# Patient Record
Sex: Male | Born: 1935 | Race: Black or African American | Hispanic: No | Smoking: Former smoker
Health system: Southern US, Community
[De-identification: ages and names within clinical notes are randomized; demographics above are authoritative.]

## PROBLEM LIST (undated history)

## (undated) DIAGNOSIS — Z9989 Dependence on other enabling machines and devices: Secondary | ICD-10-CM

## (undated) DIAGNOSIS — T8859XA Other complications of anesthesia, initial encounter: Secondary | ICD-10-CM

## (undated) DIAGNOSIS — I350 Nonrheumatic aortic (valve) stenosis: Secondary | ICD-10-CM

## (undated) DIAGNOSIS — I639 Cerebral infarction, unspecified: Secondary | ICD-10-CM

## (undated) DIAGNOSIS — I509 Heart failure, unspecified: Secondary | ICD-10-CM

## (undated) DIAGNOSIS — J189 Pneumonia, unspecified organism: Secondary | ICD-10-CM

## (undated) DIAGNOSIS — N400 Enlarged prostate without lower urinary tract symptoms: Secondary | ICD-10-CM

## (undated) DIAGNOSIS — I2510 Atherosclerotic heart disease of native coronary artery without angina pectoris: Secondary | ICD-10-CM

## (undated) DIAGNOSIS — Z9861 Coronary angioplasty status: Secondary | ICD-10-CM

## (undated) DIAGNOSIS — D472 Monoclonal gammopathy: Principal | ICD-10-CM

## (undated) DIAGNOSIS — J61 Pneumoconiosis due to asbestos and other mineral fibers: Secondary | ICD-10-CM

## (undated) DIAGNOSIS — G4733 Obstructive sleep apnea (adult) (pediatric): Secondary | ICD-10-CM

## (undated) DIAGNOSIS — R569 Unspecified convulsions: Secondary | ICD-10-CM

## (undated) DIAGNOSIS — I10 Essential (primary) hypertension: Secondary | ICD-10-CM

## (undated) DIAGNOSIS — D469 Myelodysplastic syndrome, unspecified: Secondary | ICD-10-CM

## (undated) DIAGNOSIS — T4145XA Adverse effect of unspecified anesthetic, initial encounter: Secondary | ICD-10-CM

## (undated) DIAGNOSIS — I2119 ST elevation (STEMI) myocardial infarction involving other coronary artery of inferior wall: Secondary | ICD-10-CM

## (undated) DIAGNOSIS — C9000 Multiple myeloma not having achieved remission: Secondary | ICD-10-CM

## (undated) DIAGNOSIS — I480 Paroxysmal atrial fibrillation: Secondary | ICD-10-CM

## (undated) HISTORY — DX: Benign prostatic hyperplasia without lower urinary tract symptoms: N40.0

## (undated) HISTORY — DX: Essential (primary) hypertension: I10

## (undated) HISTORY — DX: Myelodysplastic syndrome, unspecified: D46.9

## (undated) HISTORY — DX: Nonrheumatic aortic (valve) stenosis: I35.0

## (undated) HISTORY — DX: Paroxysmal atrial fibrillation: I48.0

## (undated) HISTORY — DX: Atherosclerotic heart disease of native coronary artery without angina pectoris: I25.10

## (undated) HISTORY — DX: Morbid (severe) obesity due to excess calories: E66.01

## (undated) HISTORY — DX: ST elevation (STEMI) myocardial infarction involving other coronary artery of inferior wall: I21.19

## (undated) HISTORY — DX: Monoclonal gammopathy: D47.2

## (undated) HISTORY — DX: Multiple myeloma not having achieved remission: C90.00

## (undated) HISTORY — DX: Coronary angioplasty status: Z98.61

## (undated) HISTORY — PX: TRANSTHORACIC ECHOCARDIOGRAM: SHX275

---

## 1995-03-07 DIAGNOSIS — I2510 Atherosclerotic heart disease of native coronary artery without angina pectoris: Secondary | ICD-10-CM

## 1995-03-07 DIAGNOSIS — I2119 ST elevation (STEMI) myocardial infarction involving other coronary artery of inferior wall: Secondary | ICD-10-CM

## 1995-03-07 HISTORY — DX: Atherosclerotic heart disease of native coronary artery without angina pectoris: I25.10

## 1995-03-07 HISTORY — DX: ST elevation (STEMI) myocardial infarction involving other coronary artery of inferior wall: I21.19

## 1999-01-09 HISTORY — PX: NM MYOVIEW LTD: HXRAD82

## 2000-10-18 ENCOUNTER — Ambulatory Visit (HOSPITAL_COMMUNITY): Admission: RE | Admit: 2000-10-18 | Discharge: 2000-10-18 | Payer: Self-pay | Admitting: Gastroenterology

## 2000-10-18 ENCOUNTER — Encounter (INDEPENDENT_AMBULATORY_CARE_PROVIDER_SITE_OTHER): Payer: Self-pay | Admitting: Specialist

## 2007-02-08 HISTORY — PX: CARDIAC CATHETERIZATION: SHX172

## 2007-05-23 ENCOUNTER — Encounter: Admission: RE | Admit: 2007-05-23 | Discharge: 2007-05-23 | Payer: Self-pay | Admitting: Internal Medicine

## 2007-06-17 ENCOUNTER — Emergency Department (HOSPITAL_COMMUNITY): Admission: EM | Admit: 2007-06-17 | Discharge: 2007-06-17 | Payer: Self-pay | Admitting: Emergency Medicine

## 2007-12-13 ENCOUNTER — Encounter: Admission: RE | Admit: 2007-12-13 | Discharge: 2007-12-13 | Payer: Self-pay | Admitting: Internal Medicine

## 2007-12-16 ENCOUNTER — Encounter: Admission: RE | Admit: 2007-12-16 | Discharge: 2007-12-16 | Payer: Self-pay | Admitting: Internal Medicine

## 2007-12-20 ENCOUNTER — Ambulatory Visit: Payer: Self-pay | Admitting: Oncology

## 2007-12-27 ENCOUNTER — Encounter: Payer: Self-pay | Admitting: Oncology

## 2007-12-27 ENCOUNTER — Other Ambulatory Visit: Admission: RE | Admit: 2007-12-27 | Discharge: 2007-12-27 | Payer: Self-pay | Admitting: Oncology

## 2007-12-27 LAB — COMPREHENSIVE METABOLIC PANEL
Albumin: 4.3 g/dL (ref 3.5–5.2)
CO2: 24 mEq/L (ref 19–32)
Glucose, Bld: 114 mg/dL — ABNORMAL HIGH (ref 70–99)
Sodium: 139 mEq/L (ref 135–145)
Total Bilirubin: 0.4 mg/dL (ref 0.3–1.2)
Total Protein: 7.7 g/dL (ref 6.0–8.3)

## 2007-12-27 LAB — CBC WITH DIFFERENTIAL/PLATELET
EOS%: 0.4 % (ref 0.0–7.0)
HCT: 37.6 % — ABNORMAL LOW (ref 38.7–49.9)
HGB: 12.9 g/dL — ABNORMAL LOW (ref 13.0–17.1)
LYMPH%: 52.5 % — ABNORMAL HIGH (ref 14.0–48.0)
MCH: 28.2 pg (ref 28.0–33.4)
MCV: 82.1 fL (ref 81.6–98.0)
MONO#: 0.5 10*3/uL (ref 0.1–0.9)
MONO%: 27.5 % — ABNORMAL HIGH (ref 0.0–13.0)
NEUT%: 18.2 % — ABNORMAL LOW (ref 40.0–75.0)
Platelets: 149 10*3/uL (ref 145–400)
WBC: 1.9 10*3/uL — ABNORMAL LOW (ref 4.0–10.0)
lymph#: 1 10*3/uL (ref 0.9–3.3)

## 2007-12-27 LAB — LACTATE DEHYDROGENASE: LDH: 155 U/L (ref 94–250)

## 2008-01-16 ENCOUNTER — Ambulatory Visit: Payer: Self-pay | Admitting: Oncology

## 2008-01-16 LAB — CHCC SMEAR

## 2008-01-16 LAB — CBC WITH DIFFERENTIAL/PLATELET
BASO%: 1.4 % (ref 0.0–2.0)
Basophils Absolute: 0 10*3/uL (ref 0.0–0.1)
HGB: 12.8 g/dL — ABNORMAL LOW (ref 13.0–17.1)
LYMPH%: 43.7 % (ref 14.0–48.0)
MCV: 81.9 fL (ref 81.6–98.0)
NEUT#: 0.5 10*3/uL — ABNORMAL LOW (ref 1.5–6.5)
Platelets: 191 10*3/uL (ref 145–400)

## 2008-03-20 ENCOUNTER — Ambulatory Visit: Payer: Self-pay | Admitting: Oncology

## 2008-03-24 LAB — CBC WITH DIFFERENTIAL/PLATELET
BASO%: 0.4 % (ref 0.0–2.0)
EOS%: 0.2 % (ref 0.0–7.0)
HCT: 37.5 % — ABNORMAL LOW (ref 38.7–49.9)
HGB: 12.6 g/dL — ABNORMAL LOW (ref 13.0–17.1)
LYMPH%: 52 % — ABNORMAL HIGH (ref 14.0–48.0)
MCHC: 33.5 g/dL (ref 32.0–35.9)
MONO#: 0.6 10*3/uL (ref 0.1–0.9)
MONO%: 26.7 % — ABNORMAL HIGH (ref 0.0–13.0)
Platelets: 145 10*3/uL (ref 145–400)
RDW: 13.9 % (ref 11.2–14.6)
WBC: 2.3 10*3/uL — ABNORMAL LOW (ref 4.0–10.0)
lymph#: 1.2 10*3/uL (ref 0.9–3.3)

## 2008-03-24 LAB — TECHNOLOGIST REVIEW

## 2008-03-25 LAB — ANA: Anti Nuclear Antibody(ANA): POSITIVE — AB

## 2008-03-25 LAB — ANTI-NUCLEAR AB-TITER (ANA TITER): ANA Titer 1: NEGATIVE

## 2008-03-31 ENCOUNTER — Ambulatory Visit (HOSPITAL_COMMUNITY): Admission: RE | Admit: 2008-03-31 | Discharge: 2008-03-31 | Payer: Self-pay | Admitting: Oncology

## 2008-03-31 ENCOUNTER — Ambulatory Visit: Payer: Self-pay | Admitting: Oncology

## 2008-03-31 ENCOUNTER — Encounter: Payer: Self-pay | Admitting: Oncology

## 2008-04-30 LAB — CBC WITH DIFFERENTIAL/PLATELET
BASO%: 0.3 % (ref 0.0–2.0)
EOS%: 0.2 % (ref 0.0–7.0)
HGB: 12.3 g/dL — ABNORMAL LOW (ref 13.0–17.1)
LYMPH%: 21.5 % (ref 14.0–49.0)
MCV: 85 fL (ref 79.3–98.0)
NEUT%: 63 % (ref 39.0–75.0)
RBC: 4.47 10*6/uL (ref 4.20–5.82)
nRBC: 0 % (ref 0–0)

## 2008-05-12 ENCOUNTER — Ambulatory Visit: Payer: Self-pay | Admitting: Oncology

## 2008-05-14 LAB — COMPREHENSIVE METABOLIC PANEL
ALT: 16 U/L (ref 0–53)
CO2: 22 mEq/L (ref 19–32)
Chloride: 104 mEq/L (ref 96–112)
Creatinine, Ser: 1.16 mg/dL (ref 0.40–1.50)
Glucose, Bld: 104 mg/dL — ABNORMAL HIGH (ref 70–99)
Sodium: 142 mEq/L (ref 135–145)
Total Bilirubin: 0.3 mg/dL (ref 0.3–1.2)
Total Protein: 7.3 g/dL (ref 6.0–8.3)

## 2008-05-14 LAB — CBC WITH DIFFERENTIAL/PLATELET
BASO%: 0 % (ref 0.0–2.0)
EOS%: 1 % (ref 0.0–7.0)
Eosinophils Absolute: 0 10*3/uL (ref 0.0–0.5)
LYMPH%: 36.1 % (ref 14.0–49.0)
MCH: 27.5 pg (ref 27.2–33.4)
MCV: 85.1 fL (ref 79.3–98.0)
NEUT%: 30.6 % — ABNORMAL LOW (ref 39.0–75.0)
RBC: 3.89 10*6/uL — ABNORMAL LOW (ref 4.20–5.82)
RDW: 14.4 % (ref 11.0–14.6)
WBC: 3.1 10*3/uL — ABNORMAL LOW (ref 4.0–10.3)

## 2008-07-10 ENCOUNTER — Ambulatory Visit: Payer: Self-pay | Admitting: Oncology

## 2010-06-20 LAB — CBC
MCHC: 33.6 g/dL (ref 30.0–36.0)
MCV: 84.1 fL (ref 78.0–100.0)
Platelets: 147 10*3/uL — ABNORMAL LOW (ref 150–400)
WBC: 2.3 10*3/uL — ABNORMAL LOW (ref 4.0–10.5)

## 2010-06-20 LAB — DIFFERENTIAL
Eosinophils Relative: 0 % (ref 0–5)
Lymphocytes Relative: 37 % (ref 12–46)
Neutrophils Relative %: 28 % — ABNORMAL LOW (ref 43–77)

## 2010-06-20 LAB — CHROMOSOME ANALYSIS, BONE MARROW

## 2010-07-19 NOTE — Op Note (Signed)
NAME:  BRADEN, CIMO NO.:  [id number]   MEDICAL RECORD NO.:  [id number]          PATIENT TYPE:  OUT   LOCATION:  [facility]                         FACILITY:  [facility]   PHYSICIAN:  Firas N. Shadad        DATE OF BIRTH:  [birth year]   DATE OF PROCEDURE:  03/31/2008  DATE OF DISCHARGE:                               OPERATIVE REPORT   PROCEDURE:  Bone marrow aspirate and biopsy.   Mr. Sylla is a 77 year old gentleman whom I am evaluating for  neutropenia and mild anemia without any clear-cut etiology despite his  workup.  To complete the workup, he is scheduled today for bone marrow  aspirate and biopsy.  The risks and benefits of the procedure were  explained to Mr. Blow in detail in clinic, as well as briefly today and  informed consent was obtained.  The patient was brought into short stay  at [facility].  A timeout was taken out to identify Mr. Malia  and the procedure.  American Society of anesthesiology scale was  determined to be class II, and his Mallampati class was class III.  The  patient subsequently was placed in the decubitus position exposing his  left iliac crest.  The skin was prepped and draped in a sterile fashion  using Betadine.  Conscious sedation was obtained using 5 mg of Versed 50  mg of Demerol.  Subsequently, the skin was anesthetized using 2%  lidocaine, as well as the subcutaneous tissue and the periosteum.  Using  the Jamshidi needle an aspirate was obtained without difficulty and  using the same Jamshidi needle in a different location, a biopsy was  obtained without any difficulty.  The patient tolerated the procedure  well.  There were no complications.  No bleeding problems.  The patient  tolerated the procedure well.  He was instructed to lay flat for the  next 45 minutes, and he will follow-up with me on April 02, 2008, to  discuss the results.           ______________________________  Blenda Nicely [facility]  Electronically  Signed     FNS/MEDQ  D:  03/31/2008  T:  03/31/2008  Job:  [id number]

## 2010-07-22 NOTE — Procedures (Signed)
[facility]  Patient:    Terry Robertson, Terry Robertson                          MRN: [id number] Proc. Date: 10/18/00 Adm. Date:  [id number] Disc. Date: [id number] Attending:  Charna Elizabeth CC:         Merlene Laughter. Renae Gloss, M.D.   Procedure Report  DATE OF BIRTH:  1928/06/09.  PROCEDURE:  Colonoscopy with hot biopsy.  ENDOSCOPIST:  Anselmo Rod, M.D.  INSTRUMENT USED:  Olympus video colonoscope.  INDICATION FOR PROCEDURE:  A 77 year old African-American male with a history of rectal bleeding.  Rule out colonic polyps, hemorrhoids, etc.  PREPROCEDURE PREPARATION:  Informed consent was procured from the patient. The patient was fasted for eight hours prior to the procedure and prepped with a bottle of magnesium citrate and a gallon of NuLytely the night prior to the procedure.  PREPROCEDURE PHYSICAL:  VITAL SIGNS:  The patient had stable vital signs.  NECK:  Supple.  CHEST:  Clear to auscultation.  S1, S2 regular.  ABDOMEN:  Soft with normal bowel sounds.  DESCRIPTION OF PROCEDURE:  The patient was placed in the left lateral decubitus position and sedated with 50 mg of Demerol and 5 mg of Versed intravenously.  Once the patient was adequately sedate and maintained on low-flow oxygen and continuous cardiac monitoring, the Olympus video colonoscope was advanced from the rectum to the cecum with slight difficulty secondary to some residual stool in the colon.  There were two small polyps biopsied from the right colon just distal to the cecum.  Small internal hemorrhoid appreciated on retroflexion.  The terminal ileum appeared healthy and without lesions.  IMPRESSION: 1. Healthy-appearing terminal ileum. 2. Two small polyps hot biopsied from right colon just distal to the cecum. 3. Small, nonbleeding internal hemorrhoid.  RECOMMENDATIONS: 1. A high-fiber diet has been recommended for the patient. 2. Await pathology results. 3. Outpatient  follow-up in the next four weeks. DD:  10/21/00 TD:  10/22/00 Job: [id number]

## 2010-12-05 HISTORY — PX: TRANSURETHRAL RESECTION OF PROSTATE: SHX73

## 2011-06-12 ENCOUNTER — Telehealth: Payer: Self-pay | Admitting: Oncology

## 2011-06-12 NOTE — Telephone Encounter (Signed)
lmonvm adviisng the pt of his f/u appt with dr Clelia Croft to get restablished. lmonvm for the pt to call me back to confirm the appt

## 2011-06-13 ENCOUNTER — Telehealth: Payer: Self-pay | Admitting: Oncology

## 2011-06-13 NOTE — Telephone Encounter (Signed)
S/w the pt and he is aware of the new pt appt with dr Clelia Croft on 06/14/2011@10 :00am

## 2011-06-13 NOTE — Telephone Encounter (Signed)
Referred by Dr. Allyne Gee Dx- Leukocytosis

## 2011-06-14 ENCOUNTER — Ambulatory Visit (HOSPITAL_BASED_OUTPATIENT_CLINIC_OR_DEPARTMENT_OTHER): Payer: Self-pay | Admitting: Oncology

## 2011-06-14 ENCOUNTER — Encounter: Payer: Self-pay | Admitting: Oncology

## 2011-06-14 ENCOUNTER — Other Ambulatory Visit: Payer: Self-pay | Admitting: Oncology

## 2011-06-14 ENCOUNTER — Telehealth: Payer: Self-pay | Admitting: Oncology

## 2011-06-14 ENCOUNTER — Ambulatory Visit: Payer: Self-pay

## 2011-06-14 ENCOUNTER — Other Ambulatory Visit (HOSPITAL_BASED_OUTPATIENT_CLINIC_OR_DEPARTMENT_OTHER): Payer: Self-pay | Admitting: Lab

## 2011-06-14 VITALS — BP 135/65 | HR 78 | Temp 96.7°F | Ht 65.0 in | Wt 259.9 lb

## 2011-06-14 DIAGNOSIS — D649 Anemia, unspecified: Secondary | ICD-10-CM

## 2011-06-14 DIAGNOSIS — D709 Neutropenia, unspecified: Secondary | ICD-10-CM

## 2011-06-14 DIAGNOSIS — D759 Disease of blood and blood-forming organs, unspecified: Secondary | ICD-10-CM

## 2011-06-14 DIAGNOSIS — D469 Myelodysplastic syndrome, unspecified: Secondary | ICD-10-CM

## 2011-06-14 LAB — CBC WITH DIFFERENTIAL/PLATELET
Basophils Absolute: 0 10*3/uL (ref 0.0–0.1)
Eosinophils Absolute: 0 10*3/uL (ref 0.0–0.5)
HGB: 11.7 g/dL — ABNORMAL LOW (ref 13.0–17.1)
NEUT#: 0.3 10*3/uL — CL (ref 1.5–6.5)
RDW: 14.4 % (ref 11.0–14.6)
WBC: 2.4 10*3/uL — ABNORMAL LOW (ref 4.0–10.3)
lymph#: 1.2 10*3/uL (ref 0.9–3.3)
nRBC: 0 % (ref 0–0)

## 2011-06-14 LAB — CHCC SMEAR

## 2011-06-14 NOTE — Progress Notes (Signed)
Patient came in today as a new patient,he said he has not got his new insurance card in the mail yet,but it is on the way.

## 2011-06-14 NOTE — Progress Notes (Signed)
Mailed updated medication list to patient's home 

## 2011-06-14 NOTE — Telephone Encounter (Signed)
gve the pt his oct 2013 appt calendar °

## 2011-06-14 NOTE — Progress Notes (Signed)
Hematology and Oncology Follow Up Visit  Terry Robertson [id number] 25-Jun-1928 78 y.o. 06/14/2011 11:05 AM    Principle Diagnosis: This is a 78 year old gentleman with myelodysplastic syndrome manifesting predominantly with mild anemia and neutropenia.  Prior Therapy: S/P bone marrow biopsy in 03/2008 to confirm the above diagnosis.    Interim History: Terry Robertson presents today for a follow-up visit.  He is a gentleman who has been following since October 2009, with an isolated neutropenia.  However, in most recent time developed worsening anemia as well.  His bone marrow biopsy showed an element of both myeloproliferative as well as myelodysplastic syndrome, but overall I felt that the features are consistent with myelodysplastic syndrome.  I have elected to treatment him with supportive care only for the time being.  He is completely asymptomatic.  He has not reported any recurrent sign of pulmonary infection.  He is not having increased blast.  His cytogenetics are normat.  Since the last time I saw him he has not reported any problems.  He has received one Neulasta injection in the past and has helped his blood count dramatically and it is good to know that he has a good response to it down the line.  Otherwise, Terry Robertson does not report any recurrent sign of pulmonary injection.  He has not reported any peripheral neuropathy.  He has not reported any other complaints. He failed to follow up with me for the last three years, but he back again to reestablish care.   Medications: I have reviewed the patient's current medications. No current outpatient prescriptions on file.  Allergies: Allergies not on file  Past Medical History, Surgical history, Social history, and Family History were reviewed and updated.  Review of Systems: Constitutional:  Negative for fever, chills, night sweats, anorexia, weight loss, pain. Cardiovascular: no chest pain or dyspnea on exertion Respiratory: no cough, shortness  of breath, or wheezing Neurological: no TIA or stroke symptoms Dermatological: negative ENT: negative Skin: Negative. Gastrointestinal: no abdominal pain, change in bowel habits, or black or bloody stools Genito-Urinary: negative Hematological and Lymphatic: negative Breast: negative Musculoskeletal: negative Remaining ROS negative. Physical Exam: Blood pressure 135/65, pulse 78, temperature 96.7 F (35.9 C), temperature source Oral, height 5\' 5"  (1.651 m), weight 259 lb 14.4 oz (117.89 kg). ECOG: 1 General appearance: alert Head: Normocephalic, without obvious abnormality, atraumatic Neck: no adenopathy, no carotid bruit, no JVD, supple, symmetrical, trachea midline and thyroid not enlarged, symmetric, no tenderness/mass/nodules Lymph nodes: Cervical, supraclavicular, and axillary nodes normal. Heart:regular rate and rhythm, S1, S2 normal, no murmur, click, rub or gallop Lung:chest clear, no wheezing, rales, normal symmetric air entry Abdomin: soft, non-tender, without masses or organomegaly EXT:no erythema, induration, or nodules   Lab Results: Lab Results  Component Value Date   WBC 2.4* 06/14/2011   HGB 11.7* 06/14/2011   HCT 35.0* 06/14/2011   MCV 83.6 06/14/2011   PLT 93* 06/14/2011     Chemistry      Component Value Date/Time   NA 142 05/14/2008 1446   K 4.0 05/14/2008 1446   CL 104 05/14/2008 1446   CO2 22 05/14/2008 1446   BUN 18 05/14/2008 1446   CREATININE 1.16 05/14/2008 1446      Component Value Date/Time   CALCIUM 8.1* 05/14/2008 1446   ALKPHOS 48 05/14/2008 1446   AST 21 05/14/2008 1446   ALT 16 05/14/2008 1446   BILITOT 0.3 05/14/2008 1446        Impression and Plan:  This is a  78 year old gentleman with following the issues: 1. Myelodysplastic/myeloproliferative disorder, JAK2 mutation is negative.  His cytogenetics normal, but for the time being it is more likely myelodysplastic syndrome more than anything else.  We will continue supportive measures at this  time.I will not treat him with any growth factor support unless he has a symptomatology such as recurrent infection etc.  We will continue to follow him every few months. 2.         Cytopenias: No need for any transfusions or growth factor support at this time.    [facility], MD 4/10/201311:05 AM

## 2011-06-15 LAB — COMPREHENSIVE METABOLIC PANEL
ALT: 10 U/L (ref 0–53)
Albumin: 4.1 g/dL (ref 3.5–5.2)
Alkaline Phosphatase: 41 U/L (ref 39–117)
CO2: 23 mEq/L (ref 19–32)
Glucose, Bld: 139 mg/dL — ABNORMAL HIGH (ref 70–99)
Potassium: 3.7 mEq/L (ref 3.5–5.3)
Sodium: 136 mEq/L (ref 135–145)
Total Protein: 8.4 g/dL — ABNORMAL HIGH (ref 6.0–8.3)

## 2011-07-12 ENCOUNTER — Encounter: Payer: Self-pay | Admitting: Oncology

## 2011-07-12 NOTE — Progress Notes (Signed)
Patient came in today with a letter stating that the time of his visit medicare should have been filed. Patient also gave new insurance card. I have added information in the system.

## 2011-08-05 ENCOUNTER — Encounter (HOSPITAL_COMMUNITY): Payer: Self-pay

## 2011-08-05 ENCOUNTER — Inpatient Hospital Stay (HOSPITAL_COMMUNITY)
Admission: EM | Admit: 2011-08-05 | Discharge: 2011-08-14 | DRG: 314 | Disposition: A | Discharge status: Home / Self Care | Payer: Medicare Other | Attending: Cardiology | Admitting: Cardiology

## 2011-08-05 ENCOUNTER — Emergency Department (HOSPITAL_COMMUNITY): Payer: Medicare Other

## 2011-08-05 DIAGNOSIS — Z7982 Long term (current) use of aspirin: Secondary | ICD-10-CM

## 2011-08-05 DIAGNOSIS — E860 Dehydration: Secondary | ICD-10-CM | POA: Diagnosis present

## 2011-08-05 DIAGNOSIS — D696 Thrombocytopenia, unspecified: Secondary | ICD-10-CM | POA: Diagnosis present

## 2011-08-05 DIAGNOSIS — E8779 Other fluid overload: Secondary | ICD-10-CM | POA: Diagnosis present

## 2011-08-05 DIAGNOSIS — I6340 Cerebral infarction due to embolism of unspecified cerebral artery: Secondary | ICD-10-CM | POA: Diagnosis not present

## 2011-08-05 DIAGNOSIS — R471 Dysarthria and anarthria: Secondary | ICD-10-CM | POA: Diagnosis not present

## 2011-08-05 DIAGNOSIS — Z9861 Coronary angioplasty status: Secondary | ICD-10-CM

## 2011-08-05 DIAGNOSIS — R4701 Aphasia: Secondary | ICD-10-CM | POA: Diagnosis not present

## 2011-08-05 DIAGNOSIS — Z87891 Personal history of nicotine dependence: Secondary | ICD-10-CM

## 2011-08-05 DIAGNOSIS — D649 Anemia, unspecified: Secondary | ICD-10-CM | POA: Diagnosis present

## 2011-08-05 DIAGNOSIS — E119 Type 2 diabetes mellitus without complications: Secondary | ICD-10-CM | POA: Diagnosis present

## 2011-08-05 DIAGNOSIS — I959 Hypotension, unspecified: Principal | ICD-10-CM | POA: Diagnosis present

## 2011-08-05 DIAGNOSIS — I509 Heart failure, unspecified: Secondary | ICD-10-CM

## 2011-08-05 DIAGNOSIS — I10 Essential (primary) hypertension: Secondary | ICD-10-CM | POA: Diagnosis present

## 2011-08-05 DIAGNOSIS — D469 Myelodysplastic syndrome, unspecified: Secondary | ICD-10-CM | POA: Diagnosis present

## 2011-08-05 DIAGNOSIS — I4891 Unspecified atrial fibrillation: Secondary | ICD-10-CM | POA: Diagnosis present

## 2011-08-05 DIAGNOSIS — I5189 Other ill-defined heart diseases: Secondary | ICD-10-CM | POA: Diagnosis present

## 2011-08-05 DIAGNOSIS — N179 Acute kidney failure, unspecified: Secondary | ICD-10-CM | POA: Diagnosis present

## 2011-08-05 DIAGNOSIS — E8770 Fluid overload, unspecified: Secondary | ICD-10-CM | POA: Diagnosis present

## 2011-08-05 DIAGNOSIS — Z79899 Other long term (current) drug therapy: Secondary | ICD-10-CM

## 2011-08-05 DIAGNOSIS — Z9079 Acquired absence of other genital organ(s): Secondary | ICD-10-CM

## 2011-08-05 DIAGNOSIS — D61818 Other pancytopenia: Secondary | ICD-10-CM | POA: Diagnosis present

## 2011-08-05 DIAGNOSIS — I2510 Atherosclerotic heart disease of native coronary artery without angina pectoris: Secondary | ICD-10-CM | POA: Diagnosis present

## 2011-08-05 DIAGNOSIS — I359 Nonrheumatic aortic valve disorder, unspecified: Secondary | ICD-10-CM | POA: Diagnosis present

## 2011-08-05 DIAGNOSIS — E785 Hyperlipidemia, unspecified: Secondary | ICD-10-CM | POA: Diagnosis present

## 2011-08-05 DIAGNOSIS — I639 Cerebral infarction, unspecified: Secondary | ICD-10-CM

## 2011-08-05 LAB — CARDIAC PANEL(CRET KIN+CKTOT+MB+TROPI): Troponin I: <0.3 ng/mL (ref <0.30)

## 2011-08-05 LAB — CBC
Hemoglobin: 11.5 g/dL — ABNORMAL LOW (ref 13.0–17.0)
RBC: 4.24 MIL/uL (ref 4.22–5.81)

## 2011-08-05 LAB — COMPREHENSIVE METABOLIC PANEL
CO2: 25 mEq/L (ref 19–32)
Calcium: 8.8 mg/dL (ref 8.4–10.5)
Creatinine, Ser: 1.74 mg/dL — ABNORMAL HIGH (ref 0.50–1.35)
GFR calc Af Amer: 41 mL/min — ABNORMAL LOW (ref >90)
GFR calc non Af Amer: 35 mL/min — ABNORMAL LOW (ref >90)
Glucose, Bld: 168 mg/dL — ABNORMAL HIGH (ref 70–99)

## 2011-08-05 LAB — DIFFERENTIAL
Basophils Relative: 0 % (ref 0–1)
Lymphocytes Relative: 12 % (ref 12–46)
Lymphs Abs: 1.1 10*3/uL (ref 0.7–4.0)
Monocytes Relative: 26 % — ABNORMAL HIGH (ref 3–12)
Neutro Abs: 5.7 10*3/uL (ref 1.7–7.7)
Neutrophils Relative %: 62 % (ref 43–77)

## 2011-08-05 LAB — MRSA PCR SCREENING: MRSA by PCR: NEGATIVE

## 2011-08-05 LAB — PROTIME-INR: Prothrombin Time: 15.4 seconds — ABNORMAL HIGH (ref 11.6–15.2)

## 2011-08-05 LAB — LIPASE, BLOOD: Lipase: 33 U/L (ref 11–59)

## 2011-08-05 MED ORDER — ONDANSETRON HCL 4 MG/2ML IJ SOLN: 4.0000 mg | Freq: Four times a day (QID) | INTRAMUSCULAR | Status: DC | PRN

## 2011-08-05 MED ORDER — ASPIRIN EC 81 MG PO TBEC
81.0000 mg | DELAYED_RELEASE_TABLET | Freq: Every day | ORAL | Status: DC
  Administered 2011-08-05 – 2011-08-14 (×10): 81 mg via ORAL
  Filled 2011-08-05 (×10): qty 1

## 2011-08-05 MED ORDER — SIMVASTATIN 20 MG PO TABS
20.0000 mg | ORAL_TABLET | Freq: Every day | ORAL | Status: DC
  Administered 2011-08-05 – 2011-08-08 (×4): 20 mg via ORAL
  Filled 2011-08-05 (×4): qty 1

## 2011-08-05 MED ORDER — ACETAMINOPHEN 325 MG PO TABS: 650.0000 mg | ORAL_TABLET | ORAL | Status: DC | PRN

## 2011-08-05 MED ORDER — ASPIRIN EC 81 MG PO TBEC: 81.0000 mg | DELAYED_RELEASE_TABLET | Freq: Every day | ORAL | Status: DC

## 2011-08-05 MED ORDER — SODIUM CHLORIDE 0.9 % IV BOLUS (SEPSIS)
1000.0000 mL | Freq: Once | INTRAVENOUS | Status: AC
  Administered 2011-08-05: 1000 mL via INTRAVENOUS

## 2011-08-05 MED ORDER — ASPIRIN 81 MG PO TABS: 81.0000 mg | ORAL_TABLET | Freq: Every day | ORAL | Status: DC

## 2011-08-05 MED ORDER — NITROGLYCERIN 0.4 MG SL SUBL
0.4000 mg | SUBLINGUAL_TABLET | SUBLINGUAL | Status: DC | PRN
  Administered 2011-08-06: 0.4 mg via SUBLINGUAL
  Filled 2011-08-05: qty 25

## 2011-08-05 MED ORDER — DOPAMINE-DEXTROSE 3.2-5 MG/ML-% IV SOLN
5.0000 ug/kg/min | INTRAVENOUS | Status: DC
  Administered 2011-08-05 – 2011-08-06 (×2): 5 ug/kg/min via INTRAVENOUS
  Administered 2011-08-06: 12 ug/kg/min via INTRAVENOUS
  Filled 2011-08-05 (×3): qty 250

## 2011-08-05 MED ORDER — OMEGA-3-ACID ETHYL ESTERS 1 G PO CAPS
1.0000 g | ORAL_CAPSULE | Freq: Two times a day (BID) | ORAL | Status: DC
  Administered 2011-08-05 – 2011-08-14 (×18): 1 g via ORAL
  Filled 2011-08-05 (×20): qty 1

## 2011-08-05 NOTE — ED Notes (Signed)
Critical Lab Tropn 0.32 called to Dahlia Byes RN, MD aware

## 2011-08-05 NOTE — ED Notes (Signed)
Pt complains of weakness, and sweating feeling for 2 days

## 2011-08-05 NOTE — ED Notes (Signed)
Fluids NS changed to 20ml

## 2011-08-05 NOTE — ED Notes (Signed)
Patient is unable to void at present. 

## 2011-08-05 NOTE — H&P (Signed)
Terry Robertson is an 78 y.o. male.   Chief Complaint:  HPI:   Patient is a-year-old male with history of coronary artery disease status post PCI to the left circumflex in 1997 and his most recent heart catheterization was December of 08. This revealed a widely patent circumflex stent in the OM branch, normal LAD and 100% occluded RCA in the distal portion near small PDA.  Ejection fraction was 55%. Patient has not had a stress test since the last cath. Echocardiogram on 12/29/2010 showed mild concentric LVH, EF greater than 55% with impaired relaxation. Mild sclerotic aortic valve with mild stenosis. His history also includes morbid obesity, dyslipidemia, hypertension, myelodysplastic syndrome with mild anemia and neutropenia, status post TURP in October 2012, diabetes mellitus.  Patient presents with 2 days of weakness and night sweats. Patient states he is felt to "drowsy" starting 2 days ago. He states he been short of breath, diaphoretic, he's had a trace of lower extremity edema and increasing urinary urgency. He also states he had melena. He denies orthopnea, chest pain, nausea, vomiting, fever, dysuria, hematuria, palpitations, dizziness.  Patient was just seen by Dr. Herbie Baltimore on May 14 and then doing quite well.  Past Medical History  Diagnosis Date  . Coronary artery disease   . Diabetes mellitus   . Hypertension     Past Surgical History  Procedure Date  . Cardiac surgery     History reviewed. No pertinent family history. Social History:  does not have a smoking history on file. He does not have any smokeless tobacco history on file. His alcohol and drug histories not on file.  Allergies: No Known Allergies  Medications: Aspirin 81 mg daily, omeprazole 40 mg daily, HCTZ 20 mg daily, amlodipine 10 mg daily, Hytrin 2 mg daily, simvastatin 20 mg daily, atenolol 50 mg daily, lavages 1000 mg 2 tablets twice daily, Imdur 30 mg daily, Neurontin twice daily, Flexeril twice daily  (Not in a  hospital admission)  Results for orders placed during the hospital encounter of 08/05/11 (from the past 48 hour(s))  COMPREHENSIVE METABOLIC PANEL     Status: Abnormal   Collection Time   08/05/11  1:27 PM      Component Value Range Comment   Sodium 134 (*) 135 - 145 (mEq/L)    Potassium 4.1  3.5 - 5.1 (mEq/L)    Chloride 98  96 - 112 (mEq/L)    CO2 25  19 - 32 (mEq/L)    Glucose, Bld 168 (*) 70 - 99 (mg/dL)    BUN 29 (*) 6 - 23 (mg/dL)    Creatinine, Ser 4.09 (*) 0.50 - 1.35 (mg/dL)    Calcium 8.8  8.4 - 10.5 (mg/dL)    Total Protein 8.6 (*) 6.0 - 8.3 (g/dL)    Albumin 2.9 (*) 3.5 - 5.2 (g/dL)    AST 29  0 - 37 (U/L)    ALT 13  0 - 53 (U/L)    Alkaline Phosphatase 42  39 - 117 (U/L)    Total Bilirubin 0.4  0.3 - 1.2 (mg/dL)    GFR calc non Af Amer 35 (*) >90 (mL/min)    GFR calc Af Amer 41 (*) >90 (mL/min)   CBC     Status: Abnormal   Collection Time   08/05/11  1:27 PM      Component Value Range Comment   WBC 9.2  4.0 - 10.5 (K/uL)    RBC 4.24  4.22 - 5.81 (MIL/uL)    Hemoglobin  11.5 (*) 13.0 - 17.0 (g/dL)    HCT 16.1 (*) 09.6 - 52.0 (%)    MCV 84.7  78.0 - 100.0 (fL)    MCH 27.1  26.0 - 34.0 (pg)    MCHC 32.0  30.0 - 36.0 (g/dL)    RDW 04.5  40.9 - 81.1 (%)    Platelets 84 (*) 150 - 400 (K/uL) PLATELET COUNT CONFIRMED BY SMEAR  DIFFERENTIAL     Status: Abnormal   Collection Time   08/05/11  1:27 PM      Component Value Range Comment   Neutrophils Relative 62  43 - 77 (%)    Neutro Abs 5.7  1.7 - 7.7 (K/uL)    Lymphocytes Relative 12  12 - 46 (%)    Lymphs Abs 1.1  0.7 - 4.0 (K/uL)    Monocytes Relative 26 (*) 3 - 12 (%)    Monocytes Absolute 2.4 (*) 0.1 - 1.0 (K/uL)    Eosinophils Relative 0  0 - 5 (%)    Eosinophils Absolute 0.0  0.0 - 0.7 (K/uL)    Basophils Relative 0  0 - 1 (%)    Basophils Absolute 0.0  0.0 - 0.1 (K/uL)   TROPONIN I     Status: Abnormal   Collection Time   08/05/11  1:27 PM      Component Value Range Comment   Troponin I 0.32 (*) <0.30 (ng/mL)     LIPASE, BLOOD     Status: Normal   Collection Time   08/05/11  1:27 PM      Component Value Range Comment   Lipase 33  11 - 59 (U/L)   LACTIC ACID, PLASMA     Status: Normal   Collection Time   08/05/11  1:27 PM      Component Value Range Comment   Lactic Acid, Venous 1.6  0.5 - 2.2 (mmol/L)   PRO B NATRIURETIC PEPTIDE     Status: Abnormal   Collection Time   08/05/11  1:27 PM      Component Value Range Comment   Pro B Natriuretic peptide (BNP) 11328.0 (*) 0 - 450 (pg/mL)   PROTIME-INR     Status: Abnormal   Collection Time   08/05/11  1:27 PM      Component Value Range Comment   Prothrombin Time 15.4 (*) 11.6 - 15.2 (seconds)    INR 1.19  0.00 - 1.49     Dg Chest Portable 1 View  08/05/2011  *RADIOLOGY REPORT*  Clinical Data: Weakness and night sweats  PORTABLE CHEST - 1 VIEW  Comparison: 12/16/2007  Findings: Heart size is mildly enlarged.  No pleural effusion or pulmonary edema.  There is no airspace consolidation identified.  IMPRESSION:  1.  Cardiac enlargement. 2.  No heart failure.  Original Report Authenticated By: Rosealee Albee, M.D.    Review of Systems  Constitutional: Positive for malaise/fatigue and diaphoresis. Negative for fever.  HENT: Negative for congestion and sore throat.   Eyes: Negative for blurred vision.  Respiratory: Positive for shortness of breath. Negative for cough.   Cardiovascular: Positive for leg swelling. Negative for chest pain, palpitations, orthopnea and PND.  Gastrointestinal: Positive for melena. Negative for nausea, vomiting, abdominal pain, diarrhea, constipation and blood in stool.  Genitourinary: Positive for urgency. Negative for dysuria and hematuria.  Neurological: Positive for weakness. Negative for dizziness and headaches.    Blood pressure 82/46, pulse 67, temperature 98.5 F (36.9 C), temperature source Oral, resp. rate 27,  SpO2 92.00%. Physical Exam  Constitutional: He is oriented to person, place, and time. He appears  well-developed. He appears distressed.       Tachypneic, morbidly obese  HENT:  Head: Normocephalic and atraumatic.  Mouth/Throat: Oropharynx is clear and moist. No oropharyngeal exudate.  Eyes: EOM are normal. Pupils are equal, round, and reactive to light. No scleral icterus.  Neck: Normal range of motion. Neck supple. JVD present.       He has a right edematous parotid gland which is tender to palpation  Cardiovascular: Normal rate and regular rhythm.   No murmur heard. Pulses:      Radial pulses are 2+ on the right side, and 2+ on the left side.       Dorsalis pedis pulses are 2+ on the right side, and 2+ on the left side.       No carotid or femoral bruits  Respiratory: He has no wheezes. He has no rales.       Decreased breath sounds bilaterally otherwise clear  GI: Soft. Bowel sounds are normal. He exhibits distension. There is no tenderness.  Musculoskeletal: He exhibits edema.       Trace of lower extremity edema  Neurological: He is alert and oriented to person, place, and time. He exhibits normal muscle tone.  Skin: Skin is warm and dry.  Psychiatric: He has a normal mood and affect.     Assessment/Plan Patient Active Hospital Problem List:  Hypotension (08/05/2011)  CAD (coronary artery disease) PCI to circumflex in 1997.  100% occluded RCA (08/05/2011) Morbid obesity (08/05/2011) Myelodysplastic syndrome (08/05/2011) Dyslipidemia (08/05/2011) HTN (hypertension) (08/05/2011) Volume overload (08/05/2011) S/P TURP Oct 2012 (08/05/2011) DM type 2 (diabetes mellitus, type 2) (08/05/2011) Acute renal insufficiency  Plan:  Patient will be admitted to step down.  He was started on dopamine. Check urinalysis and blood cultures. He is likely having a elevated troponin due to demand ischemia. His creatinine is likely elevated due to underperfusion of the kidneys secondary to hypotension. We'll start to decrease home blood pressure medications.  We'll recheck 2-D echocardiogram.  HAGER,  BRYAN 08/05/2011, 4:02 PM  ATTENDING ATTESTATION:  I have seen and examined the patient along with Wilburt Finlay, PA.  I have reviewed the chart, notes and new data.  I agree with Bryan's note.  Brief Description: Patient well known to me (my clinic patient) with h/o CAD (PCI to Cx & 100% CTO of RCA), hypertension, DM-2, and morbid obesity (etc.) who was doing well when seen recently in clinic.  Over the past "few days" he has become more nauseated & weak, with more difficulty breathing & orthopnea.  He denies any anginal pain at rest or with exertion.  He has not actually vomitted, but has been nauseated & not eating well -- but continued to take his BP medications. He presented to the ER this AM with SBPs in the 60s with weakness, nausea & fatigue.  My review of his CXR demonstrates diffuse airspace disease - suggesting pulmonary edema  Key examination changes: mild JVD, Aortic Sclerosis murmur, mild rales with decreased bibasilar breath sounds but only mild (baseline) edema.R cervical / parotid region tenderness.  Key new findings / data: Cr 1.74, mild troponin elevation (0.32 -no ECG changes & no angina), mildly elevated monocyte level but no bands or increased WBC  PLAN:  Admit to stepdown, hold BP medications - will start on low dose Dopamine for pressure support while awaiting his BP medication effect to wear off.  Once BP stable, would actually consider diuresis - although, hopefully with inotropic effect of Dopamine, I would hope for diuresis without diuretic.  Check 2D echo.  Will not restart Amlodipine on discharge.    Renal insufficency - due to hypotension / hypoperfusion; this may also explain the Troponin level -- do not suspect ACS.  Check for infection - UA +/- culture (including blood cultures); Amylase & lipase normal, making pancreatitis unlikely.  Normal LFTs make Gallbladder disease unlikely.   Marykay Lex, M.D., M.S. [facility] [address] [place], [place]  [address]  [phone]  08/05/2011 4:29 PM

## 2011-08-05 NOTE — ED Provider Notes (Signed)
History     CSN: [id number]  Arrival date & time 08/05/11  1227   First MD Initiated Contact with Patient 08/05/11 1234      Chief Complaint  Patient presents with  . Weakness  . Night Sweats     HPI Patient comes in with two-day history of increasing weakness, lethargy, shortness of breath, and fatigue.  Patient denies chest pain.  Denies nausea, vomiting, diarrhea.  Past medical history is positive for coronary artery disease, diabetes mellitus, hypertension.  Patient denies any change in medication or dosages. Past Medical History  Diagnosis Date  . Coronary artery disease   . Diabetes mellitus   . Hypertension     Past Surgical History  Procedure Date  . Cardiac surgery     History reviewed. No pertinent family history.  History  Substance Use Topics  . Smoking status: Not on file  . Smokeless tobacco: Not on file  . Alcohol Use:       Review of Systems  Allergies  Review of patient's allergies indicates no known allergies.  Home Medications   Current Outpatient Rx  Name Route Sig Dispense Refill  . AMLODIPINE BESYLATE 10 MG PO TABS Oral Take 10 mg by mouth daily.    . ASPIRIN 81 MG PO TABS Oral Take 81 mg by mouth daily.    . ATENOLOL 50 MG PO TABS Oral Take 50 mg by mouth daily.    Marland Kitchen GABAPENTIN 100 MG PO CAPS Oral Take 200 mg by mouth at bedtime.     Marland Kitchen HYDROCHLOROTHIAZIDE 25 MG PO TABS Oral Take 25 mg by mouth daily.    Marland Kitchen HYDROCODONE-ACETAMINOPHEN 7.5-325 MG PO TABS Oral Take 1 tablet by mouth every 6 (six) hours as needed. For pain    . ISOSORBIDE DINITRATE 30 MG PO TABS Oral Take 30 mg by mouth daily.    Marland Kitchen LISINOPRIL 40 MG PO TABS Oral Take 40 mg by mouth daily.    Marland Kitchen METHOCARBAMOL 500 MG PO TABS Oral Take 500 mg by mouth 2 (two) times daily as needed. Muscle spasms    . OMEGA-3-ACID ETHYL ESTERS 1 G PO CAPS Oral Take 1 g by mouth 2 (two) times daily.    Marland Kitchen SIMVASTATIN 20 MG PO TABS Oral Take 20 mg by mouth daily.    . STUDY MEDICATION Oral Take 1  capsule by mouth daily. 2 bottles of a Study Drug for gout takes 1 capsule from each bottle once daily    . TERAZOSIN HCL 2 MG PO CAPS Oral Take 2 mg by mouth daily.      BP 82/46  Pulse 67  Temp(Src) 98.5 F (36.9 C) (Oral)  Resp 27  SpO2 92%  Physical Exam  Nursing note and vitals reviewed. Constitutional: He is oriented to person, place, and time. He appears well-developed and well-nourished.       Appears weak and fatigued.  HENT:  Head: Normocephalic and atraumatic.  Eyes: Pupils are equal, round, and reactive to light.  Neck: Normal range of motion.  Cardiovascular: Normal rate and intact distal pulses.        Normal sinus rhythm Rate = 70 QRS = 96 QTC = 453 Nonspecific ST changes. No old EKGs for comparison  Pulmonary/Chest: No respiratory distress.  Abdominal: Normal appearance. He exhibits no distension.  Musculoskeletal: Normal range of motion.  Neurological: He is alert and oriented to person, place, and time. No cranial nerve deficit.  Skin: Skin is warm and dry. No rash noted.  Psychiatric: He has a normal mood and affect. His behavior is normal.    ED Course  Procedures (including critical care time) CRITICAL CARE Performed by: Nelva Nay L   Total critical care time: 30 min  Critical care time was exclusive of separately billable procedures and treating other patients.  Critical care was necessary to treat or prevent imminent or life-threatening deterioration.  Critical care was time spent personally by me on the following activities: development of treatment plan with patient and/or surrogate as well as nursing, discussions with consultants, evaluation of patient's response to treatment, examination of patient, obtaining history from patient or surrogate, ordering and performing treatments and interventions, ordering and review of laboratory studies, ordering and review of radiographic studies, pulse oximetry and re-evaluation of patient's  condition.  Scheduled Meds:   . aspirin EC  81 mg Oral Daily  . omega-3 acid ethyl esters  1 g Oral BID  . simvastatin  20 mg Oral Daily  . sodium chloride  1,000 mL Intravenous Once  . sodium chloride  250 mL Intravenous Once  . DISCONTD: aspirin EC  81 mg Oral Daily  . DISCONTD: aspirin  81 mg Oral Daily   Continuous Infusions:   . sodium chloride 75 mL/hr at 08/06/11 0629  . DOPamine 12 mcg/kg/min (08/06/11 0447)   PRN Meds:.acetaminophen, nitroGLYCERIN, ondansetron (ZOFRAN) IV   Labs Reviewed  COMPREHENSIVE METABOLIC PANEL - Abnormal; Notable for the following:    Sodium 134 (*)    Glucose, Bld 168 (*)    BUN 29 (*)    Creatinine, Ser 1.74 (*)    Total Protein 8.6 (*)    Albumin 2.9 (*)    GFR calc non Af Amer 35 (*)    GFR calc Af Amer 41 (*)    All other components within normal limits  CBC - Abnormal; Notable for the following:    Hemoglobin 11.5 (*)    HCT 35.9 (*)    Platelets 84 (*) PLATELET COUNT CONFIRMED BY SMEAR   All other components within normal limits  DIFFERENTIAL - Abnormal; Notable for the following:    Monocytes Relative 26 (*)    Monocytes Absolute 2.4 (*)    All other components within normal limits  TROPONIN I - Abnormal; Notable for the following:    Troponin I 0.32 (*)    All other components within normal limits  PRO B NATRIURETIC PEPTIDE - Abnormal; Notable for the following:    Pro B Natriuretic peptide (BNP) 11328.0 (*)    All other components within normal limits  PROTIME-INR - Abnormal; Notable for the following:    Prothrombin Time 15.4 (*)    All other components within normal limits  LIPASE, BLOOD  LACTIC ACID, PLASMA  URINALYSIS, ROUTINE W REFLEX MICROSCOPIC   Dg Chest Portable 1 View  08/05/2011  *RADIOLOGY REPORT*  Clinical Data: Weakness and night sweats  PORTABLE CHEST - 1 VIEW  Comparison: 12/16/2007  Findings: Heart size is mildly enlarged.  No pleural effusion or pulmonary edema.  There is no airspace consolidation  identified.  IMPRESSION:  1.  Cardiac enlargement. 2.  No heart failure.  Original Report Authenticated By: Rosealee Albee, M.D.     1. Hypotension   2. Congestive heart failure       MDM     Total Interactions: 4 3-cB:Tenormin,Norvasc 3-cB:Hytrin,Tenormin 2-Prinivil:Hytrin-other 2-cB:Hytrin,Norvasc     Nelia Shi, MD 08/06/11 [phone]

## 2011-08-06 LAB — LIPID PANEL
Cholesterol: 121 mg/dL (ref 0–200)
HDL: 27 mg/dL — ABNORMAL LOW (ref >39)
LDL Cholesterol: 71 mg/dL (ref 0–99)
Triglycerides: 117 mg/dL (ref <150)
VLDL: 23 mg/dL (ref 0–40)

## 2011-08-06 LAB — GLUCOSE, CAPILLARY
Glucose-Capillary: 140 mg/dL — ABNORMAL HIGH (ref 70–99)
Glucose-Capillary: 107 mg/dL — ABNORMAL HIGH (ref 70–99)

## 2011-08-06 LAB — URINALYSIS, ROUTINE W REFLEX MICROSCOPIC
Ketones, ur: NEGATIVE mg/dL
Leukocytes, UA: NEGATIVE
Nitrite: NEGATIVE
Specific Gravity, Urine: 1.02 (ref 1.005–1.030)
pH: 5 (ref 5.0–8.0)

## 2011-08-06 LAB — CBC
HCT: 34.5 % — ABNORMAL LOW (ref 39.0–52.0)
Hemoglobin: 11.2 g/dL — ABNORMAL LOW (ref 13.0–17.0)
MCH: 27 pg (ref 26.0–34.0)
MCHC: 32.5 g/dL (ref 30.0–36.0)
MCV: 83.1 fL (ref 78.0–100.0)
RBC: 4.15 MIL/uL — ABNORMAL LOW (ref 4.22–5.81)

## 2011-08-06 LAB — BASIC METABOLIC PANEL
BUN: 36 mg/dL — ABNORMAL HIGH (ref 6–23)
CO2: 24 mEq/L (ref 19–32)
Calcium: 8.5 mg/dL (ref 8.4–10.5)
Creatinine, Ser: 1.39 mg/dL — ABNORMAL HIGH (ref 0.50–1.35)
GFR calc non Af Amer: 46 mL/min — ABNORMAL LOW (ref >90)
Glucose, Bld: 124 mg/dL — ABNORMAL HIGH (ref 70–99)
Sodium: 135 mEq/L (ref 135–145)

## 2011-08-06 LAB — CARDIAC PANEL(CRET KIN+CKTOT+MB+TROPI)
CK, MB: 3.5 ng/mL (ref 0.3–4.0)
CK, MB: 3.5 ng/mL (ref 0.3–4.0)
Total CK: 412 U/L — ABNORMAL HIGH (ref 7–232)
Troponin I: <0.3 ng/mL (ref <0.30)
Troponin I: 0.54 ng/mL (ref <0.30)

## 2011-08-06 LAB — PROTIME-INR: INR: 1.06 (ref 0.00–1.49)

## 2011-08-06 LAB — HEMOGLOBIN A1C: Hgb A1c MFr Bld: 6.3 % — ABNORMAL HIGH (ref <5.7)

## 2011-08-06 LAB — URINE MICROSCOPIC-ADD ON

## 2011-08-06 MED ORDER — SODIUM CHLORIDE 0.9 % IV BOLUS (SEPSIS)
250.0000 mL | Freq: Once | INTRAVENOUS | Status: AC
  Administered 2011-08-06: 11:00:00 via INTRAVENOUS

## 2011-08-06 MED ORDER — SODIUM CHLORIDE 0.9 % IV BOLUS (SEPSIS)
250.0000 mL | Freq: Once | INTRAVENOUS | Status: AC
  Administered 2011-08-06: 01:00:00 via INTRAVENOUS

## 2011-08-06 MED ORDER — ZOLPIDEM TARTRATE 5 MG PO TABS: 5.0000 mg | ORAL_TABLET | Freq: Every evening | ORAL | Status: DC | PRN

## 2011-08-06 MED ORDER — ALPRAZOLAM 0.25 MG PO TABS
0.2500 mg | ORAL_TABLET | Freq: Three times a day (TID) | ORAL | Status: DC | PRN
  Administered 2011-08-06: 0.25 mg via ORAL
  Filled 2011-08-06 (×2): qty 1

## 2011-08-06 MED ORDER — MORPHINE SULFATE 2 MG/ML IJ SOLN
2.0000 mg | INTRAMUSCULAR | Status: DC | PRN
  Administered 2011-08-06 (×2): 2 mg via INTRAVENOUS
  Filled 2011-08-06 (×2): qty 1

## 2011-08-06 MED ORDER — SODIUM CHLORIDE 0.9 % IV SOLN
INTRAVENOUS | Status: AC
  Administered 2011-08-06 – 2011-08-07 (×3): via INTRAVENOUS

## 2011-08-06 MED ORDER — TRAMADOL HCL 50 MG PO TABS
50.0000 mg | ORAL_TABLET | Freq: Four times a day (QID) | ORAL | Status: DC | PRN
  Filled 2011-08-06: qty 1

## 2011-08-06 NOTE — Progress Notes (Addendum)
[facility]  Subjective: Feels better.  Breathing better and no CP  Objective: Vital signs in last 24 hours: Temp:  [98 F (36.7 C)-98.5 F (36.9 C)] 98.4 F (36.9 C) (06/02 0321) Pulse Rate:  [64-86] 86  (06/02 0700) Resp:  [20-39] 27  (06/02 0700) BP: (63-110)/(40-54) 110/52 mmHg (06/02 0700) SpO2:  [88 %-96 %] 94 % (06/02 0700) Weight:  [114.6 kg (252 lb 10.4 oz)-117.9 kg (259 lb 14.8 oz)] 116.5 kg (256 lb 13.4 oz) (06/02 0600) Last BM Date: 08/05/11  Intake/Output from previous day: 06/01 0701 - 06/02 0700 In: 1157.9 [P.O.:30; I.V.:877.9; IV Piggyback:250] Out: 600 [Urine:600] Intake/Output this shift:    Medications Current Facility-Administered Medications  Medication Dose Route Frequency Provider Last Rate Last Dose  . 0.9 %  sodium chloride infusion   Intravenous Continuous Marykay Lex, MD 75 mL/hr at 08/06/11 [phone]    . acetaminophen (TYLENOL) tablet 650 mg  650 mg Oral Q4H PRN Wilburt Finlay, PA      . aspirin EC tablet 81 mg  81 mg Oral Daily Renaee Munda, PHARMD   81 mg at 08/05/11 2002  . DOPamine (INTROPIN) 800 mg in dextrose 5 % 250 mL infusion  5 mcg/kg/min Intravenous Titrated Wilburt Finlay, PA 26.5 mL/hr at 08/06/11 0447 12 mcg/kg/min at 08/06/11 0447  . nitroGLYCERIN (NITROSTAT) SL tablet 0.4 mg  0.4 mg Sublingual Q5 Min x 3 PRN Wilburt Finlay, PA      . omega-3 acid ethyl esters (LOVAZA) capsule 1 g  1 g Oral BID Wilburt Finlay, PA   1 g at 08/05/11 2154  . ondansetron (ZOFRAN) injection 4 mg  4 mg Intravenous Q6H PRN Wilburt Finlay, PA      . simvastatin (ZOCOR) tablet 20 mg  20 mg Oral Daily Wilburt Finlay, PA   20 mg at 08/05/11 2154  . sodium chloride 0.9 % bolus 1,000 mL  1,000 mL Intravenous Once Nelia Shi, MD   1,000 mL at 08/05/11 1329  . sodium chloride 0.9 % bolus 250 mL  250 mL Intravenous Once Marykay Lex, MD      . DISCONTD: aspirin EC tablet 81 mg  81 mg Oral Daily Wilburt Finlay, [place]      . DISCONTD: aspirin tablet 81  mg  81 mg Oral Daily Wilburt Finlay, PA        PE: General appearance: alert, cooperative and no distress Lungs: clear to auscultation bilaterally; non-labored with good air movement Heart: regular rate and rhythm; 2/6 harsh crescendo-decrescendo SEM RUSB-Apex Abdomen: +BS, Nontender, tympanic Extremities: No LEE; SCDs in place Pulses: 2+ and symmetric Skin:  Appears a little moist.  Lab Results:   Basename 08/06/11 0655 08/05/11 1327  WBC 7.4 9.2  HGB 11.2* 11.5*  HCT 34.5* 35.9*  PLT 78* 84*   BMET  Basename 08/06/11 0655 08/05/11 1327  NA 135 134*  K 3.6 4.1  CL 100 98  CO2 24 25  GLUCOSE 124* 168*  BUN 36* 29*  CREATININE 1.39* 1.74*  CALCIUM 8.5 8.8   PT/INR  Basename 08/06/11 0655 08/05/11 1327  LABPROT 14.0 15.4*  INR 1.06 1.19   Cholesterol  Basename 08/06/11 0655  CHOL 121   Cardiac Enzymes CK, MB 3.6 3.5 3.5 Total CK 594 517 412 Troponin I 0.32  <0.30  <0.30  0.54(Last)  Pro B Natriuretic peptide (BNP) 11328.0   Studies/Results: UA:  Bacteria, granular casts and Proteni: 30  Assessment/Plan  Principal Problem:  *  Hypotension Active Problems:  CAD (coronary artery disease) PCI to circumflex in 1997.  100% occluded RCA  Morbid obesity  Myelodysplastic syndrome  Dyslipidemia  HTN (hypertension)  Volume overload  S/P TURP Oct 2012  DM type 2 (diabetes mellitus, type 2)  Plan:  He is doing better.  Wean dopamine.  Follow urine output. SCr has improved.  He did get of NS last night.  He does not appear to be volume overloaded but the BNP was 11K.  Blood cultures drawn.  Slight bump in troponin!?  HDL is terrible.  Will schedule Lexiscan tentatively for tomorrow.  Rechecking 2D Echo   LOS: 1 day    Robertson, Terry 08/06/2011 7:51 AM  ATTENDING ATTESTATION:  I have seen and examined the patient along with Wilburt Finlay, PA.  I have reviewed the chart, notes and new data.  I agree with Terry's note.  Brief Description: Very pleasant 78 year old  well known to me with a history of: Coronary Artery Disease (known RCA occlusion, with the PCI to the Left Circumflex-OM in December 08, normal EF by echo in October 2012), mild aortic stenosis, hypertension, morbid obesity, dyslipidemia, myelodysplastic syndrome with anemia -- who presented with weakness and sweating and general failure to thrive. He was found to be hypotensive blood pressures in the 60s and emergency room.  He also had a significantly elevated BNP and mild elevation of troponin but no anginal chest pain. He does note a little shortness of breath, but denies orthopnea or PND, palpitations. Was doing well as of May 14.   Key new complaints: Feels better  Key examination changes: I agree Terry's exam; my attestation's in bold italics; blood pressure more stable  Key new findings / data: Urine output began to pick up overnight, creatinine improved, mild troponin elevation; no elevation of white blood cell count, no fever, + bacteria in urine with a granular casts;  ECG sinus rhythm with PVCs and old inferior infarct; no acute change  My suspicion is that he has developed some type of systemic infection, be a viral or bacterial the cause and feel poorly, with reduced appetite. Then using his blood pressure medications despite not  Eating and drinking and became hypotensive.  Hypotension led to global ischemia, resulting in acute renal failure (this appears to be improving) and mild elevation.  PLAN:  We'll continue with IV fluid hydration and dopamine for now, we'll plan to wean the dopamine overnight as BP allows.  Will follow for blood and urine cultures -- treat UTI once speciated -- would be complicated UTI, especially with a history of TURP.  Continue all blood pressure medications will likely not discharge on amlodipine.  Continue statin as LFTs are fine.  Agree with checking Myoview in AM. Echo done today.  1 more day in stepdown until able wean dopamine  off.  Marykay Lex, M.D., M.S. [facility] [address] [place], [place]  [address]  [phone]  08/06/2011 9:34 AM

## 2011-08-06 NOTE — Progress Notes (Signed)
Called to bedside by RN for acute L sided CP & "diaphoresis" ECG by computer reading Acute MI.  On my review (along with Dr. Earnestine Leys) the Lateral leads, if anything are less notable for mild J point elevation than admission ECG.  This is not suggestive of acute MI.  On exam, he has point tenderness along the L5-6th ribs in the mid-axillary line that radiate up to the shoulder.  Worsening with shoulder extension & abduction. -- most consistent with MSK pain.  Will give IV Morphine PRN for pain - wanting to avoid NSAIDS with recovering ARF.  Marykay Lex, M.D., M.S. [facility] [address] [place], [place]  [address]  [phone] Pager # [phone]  08/06/2011 12:05 PM

## 2011-08-06 NOTE — Progress Notes (Signed)
  Echocardiogram 2D Echocardiogram has been performed.  Vivika Poythress L 08/06/2011, 9:57 AM

## 2011-08-07 ENCOUNTER — Other Ambulatory Visit: Payer: Self-pay

## 2011-08-07 ENCOUNTER — Encounter (HOSPITAL_COMMUNITY): Payer: Self-pay | Admitting: *Deleted

## 2011-08-07 DIAGNOSIS — I5189 Other ill-defined heart diseases: Secondary | ICD-10-CM | POA: Diagnosis present

## 2011-08-07 DIAGNOSIS — D61818 Other pancytopenia: Secondary | ICD-10-CM | POA: Diagnosis present

## 2011-08-07 LAB — BASIC METABOLIC PANEL
BUN: 21 mg/dL (ref 6–23)
Chloride: 103 mEq/L (ref 96–112)
Creatinine, Ser: 0.84 mg/dL (ref 0.50–1.35)
GFR calc Af Amer: >90 mL/min (ref >90)
GFR calc non Af Amer: 81 mL/min — ABNORMAL LOW (ref >90)
Glucose, Bld: 114 mg/dL — ABNORMAL HIGH (ref 70–99)

## 2011-08-07 MED ORDER — STUDY - INVESTIGATIONAL MEDICATION
1.0000 | Freq: Every day | Status: DC
  Administered 2011-08-07 – 2011-08-14 (×8): 1 via ORAL
  Filled 2011-08-07 (×12): qty 1

## 2011-08-07 MED ORDER — HEPARIN (PORCINE) IN NACL 100-0.45 UNIT/ML-% IJ SOLN
1400.0000 [IU]/h | INTRAMUSCULAR | Status: DC
  Administered 2011-08-07: 1100 [IU]/h via INTRAVENOUS
  Administered 2011-08-08: 1400 [IU]/h via INTRAVENOUS
  Filled 2011-08-07 (×4): qty 250

## 2011-08-07 MED ORDER — STUDY - INVESTIGATIONAL MEDICATION
1.0000 | Freq: Every day | Status: DC
  Administered 2011-08-07 – 2011-08-14 (×8): 1 via ORAL
  Filled 2011-08-07 (×11): qty 1

## 2011-08-07 MED ORDER — HEPARIN BOLUS VIA INFUSION
4000.0000 [IU] | Freq: Once | INTRAVENOUS | Status: AC
  Administered 2011-08-07: 4000 [IU] via INTRAVENOUS
  Filled 2011-08-07: qty 4000

## 2011-08-07 MED ORDER — DILTIAZEM HCL 100 MG IV SOLR
5.0000 mg/h | INTRAVENOUS | Status: AC
  Administered 2011-08-07: 5 mg/h via INTRAVENOUS
  Administered 2011-08-07: 10 mg/h via INTRAVENOUS
  Administered 2011-08-08 (×3): 15 mg/h via INTRAVENOUS
  Administered 2011-08-09 (×2): 5 mg/h via INTRAVENOUS
  Filled 2011-08-07: qty 100

## 2011-08-07 NOTE — Progress Notes (Addendum)
[facility]  Subjective: CP resolved.  No SOB or orthopnea.  Objective: Vital signs in last 24 hours: Temp:  [97.5 F (36.4 C)-99.2 F (37.3 C)] 97.5 F (36.4 C) (06/03 0346) Pulse Rate:  [72-88] 72  (06/03 0700) Resp:  [0-32] 31  (06/03 0700) BP: (96-127)/(44-67) 115/55 mmHg (06/03 0700) SpO2:  [91 %-95 %] 93 % (06/03 0700) Weight:  [117.8 kg (259 lb 11.2 oz)] 117.8 kg (259 lb 11.2 oz) (06/03 0500) Last BM Date: 08/05/11  Intake/Output from previous day: 06/02 0701 - 06/03 0700 In: 2490.6 [P.O.:300; I.V.:2190.6] Out: 1350 [Urine:1350] Intake/Output this shift: Total I/O In: -  Out: 150 [Urine:150]  Medications Current Facility-Administered Medications  Medication Dose Route Frequency Provider Last Rate Last Dose  . 0.9 %  sodium chloride infusion   Intravenous Continuous Marykay Lex, MD 50 mL/hr at 08/07/11 [phone]    . acetaminophen (TYLENOL) tablet 650 mg  650 mg Oral Q4H PRN Wilburt Finlay, PA      . ALPRAZolam Prudy Feeler) tablet 0.25 mg  0.25 mg Oral TID PRN Abelino Derrick, PA   0.25 mg at 08/06/11 2303  . aspirin EC tablet 81 mg  81 mg Oral Daily Kendra P Hiatt, PHARMD   81 mg at 08/06/11 0947  . DOPamine (INTROPIN) 800 mg in dextrose 5 % 250 mL infusion  5 mcg/kg/min Intravenous Titrated Wilburt Finlay, PA 6.6 mL/hr at 08/07/11 0600 3 mcg/kg/min at 08/07/11 0600  . morphine 2 MG/ML injection 2 mg  2 mg Intravenous Q4H PRN Marykay Lex, MD   2 mg at 08/06/11 2205  . nitroGLYCERIN (NITROSTAT) SL tablet 0.4 mg  0.4 mg Sublingual Q5 Min x 3 PRN Wilburt Finlay, PA   0.4 mg at 08/06/11 1150  . omega-3 acid ethyl esters (LOVAZA) capsule 1 g  1 g Oral BID Wilburt Finlay, PA   1 g at 08/06/11 2205  . ondansetron (ZOFRAN) injection 4 mg  4 mg Intravenous Q6H PRN Wilburt Finlay, PA      . simvastatin (ZOCOR) tablet 20 mg  20 mg Oral Daily Wilburt Finlay, PA   20 mg at 08/06/11 0947  . sodium chloride 0.9 % bolus 250 mL  250 mL Intravenous Once Marykay Lex, MD        . traMADol Janean Sark) tablet 50 mg  50 mg Oral Q6H PRN Abelino Derrick, PA      . zolpidem (AMBIEN) tablet 5 mg  5 mg Oral QHS PRN Abelino Derrick, PA        PE: General appearance: alert, cooperative and no distress Lungs: Decreased BS bilaterally.  No Wheeze Heart: regular rate and rhythm Extremities: No LEE Pulses: 2+ and symmetric  Lab Results:   Basename 08/06/11 0655 08/05/11 1327  WBC 7.4 9.2  HGB 11.2* 11.5*  HCT 34.5* 35.9*  PLT 78* 84*   BMET  Basename 08/06/11 0655 08/05/11 1327  NA 135 134*  K 3.6 4.1  CL 100 98  CO2 24 25  GLUCOSE 124* 168*  BUN 36* 29*  CREATININE 1.39* 1.74*  CALCIUM 8.5 8.8   PT/INR  Basename 08/06/11 0655 08/05/11 1327  LABPROT 14.0 15.4*  INR 1.06 1.19   Cholesterol  Basename 08/06/11 0655  CHOL 121   Cardiac Enzymes No components found with this basename: TROPONIN:3, CKMB:3  Studies/Results: 2D Echo, 08/06/11 Left ventricle: The cavity size was normal. Systolic function was normal. The estimated ejection fraction was in the range of 55% to  65%. Wall motion was normal; there were no regional wall motion abnormalities. Features are consistent with a pseudonormal left ventricular filling pattern, with concomitant abnormal relaxation and increased filling pressure (grade 2 diastolic dysfunction). Doppler parameters are consistent with both elevated ventricular end-diastolic filling pressure and elevated left atrial filling pressure. - Left atrium: The atrium was mildly dilated.  - Atrial septum: The septum bowed from left to right,  consistent with increased left atrial pressure.  - Pericardium, extracardiac: A trivial, free-flowing  pericardial effusion was identified circumferential to the heart and along the left ventricular free wall. Features  were not consistent with tamponade physiology. There was a probable small left pleural effusion. Assessment/Plan  Principal Problem:  *Hypotension Active Problems:   CAD (coronary artery  disease) PCI to circumflex in 1997.  100% occluded RCA   Morbid obesity   Myelodysplastic syndrome   Dyslipidemia   HTN (hypertension)   Volume overload   S/P TURP Oct 2012   DM type 2 (diabetes mellitus, type 2)   Diastolic dysfunction: Grade 2   Thrombocytopenia  Plan:  MSK CP resolved.  Finish weaning off Dopamine.  BP looks good.  Will get PT eval. He seem weak when pulling himself up in the bed. Recheck BMET/BNP this AM.  Likely transfer to tele later.   LOS: 2 days    HAGER, BRYAN 08/07/2011 8:54 AM  I have seen & examined the patient this AM along with Mr.Hager.  I agree with his findings, exam & impression/plan as noted above. The more the story plays out, it would appear that Mr. Nehme has some type of infectious issue -- UTI is quite likely based upon UA results.  This lead to poor PO intake due to nausea --> he continued to take BP medications & came in hypotensive -- not in shock & not septic.  His ARF was quite certainly a combination of hypovolemia & hypotension.  He has responded well to IVF hydration (despite elevated BNP & diastolic dysfunction -- arguing for another potential etiology for elevated pro-BNP). I read his echo yesterday - report above - preserved EF with Grade 2 Diastolic dysfunction.  Dopamine has been weaned off & he was able to eat some breakfast -- once taking stable PO, will d/c maintenance fluids to avoid volume overload with his diastolic dysfunction.    Continue to hold BP medications.  Await results of Urine culture prior to initiating Rx for possible UTI. DM-2 - stable sugars; was not on medications as OP, diet controled.  Transfer to Tele today. PT/OT eval & Rx  Marykay Lex, M.D., M.S. [facility] [address] [place], [place]  [address]  [phone] Pager # [phone]  08/07/2011 11:54 AM

## 2011-08-07 NOTE — Care Management Note (Addendum)
    Page 1 of 1   08/14/2011     4:26:17 PM   CARE MANAGEMENT NOTE 08/14/2011  Patient:  Terry Robertson, Terry Robertson   Account Number:  [id number]  Date Initiated:  08/07/2011  Documentation initiated by:  Junius Creamer  Subjective/Objective Assessment:   adm w hypotension     Action/Plan:   lives w fam-(3 sons and friend), pcp dr Melina Schools sanders   Anticipated DC Date:  08/10/2011   Anticipated DC Plan:    In-house referral  Clinical Social Worker      DC Planning Services  CM consult      [facility] Choice  HOME HEALTH   Choice offered to / List presented to:  C-1 Patient        HH arranged  HH-2 PT      HH agency  [facility]   Status of service:  Completed, signed off Medicare Important Message given?   (If response is "NO", the following Medicare IM given date fields will be blank) Date Medicare IM given:   Date Additional Medicare IM given:    Discharge Disposition:  HOME W HOME HEALTH SERVICES  Per UR Regulation:  Reviewed for med. necessity/level of care/duration of stay  If discussed at Long Length of Stay Meetings, dates discussed:    Comments:  08/14/10-1333-J.Lenin Kuhnle,RN,BSN [phone]      Noted need for home health services. In to speak with patient to offer choice. [facility]. Marie,RN with [facility] notified of referral. No further needs identified.  6/3 11:33a debbie dowell rn,bsn [phone] [place] maureen jones [phone] [place] [phone].

## 2011-08-07 NOTE — Progress Notes (Signed)
ANTICOAGULATION CONSULT NOTE - Initial Consult  Pharmacy Consult for heparin Indication: atrial fibrillation  No Known Allergies  Patient Measurements: Height: 5\' 5"  (165.1 cm) Weight: 259 lb 11.2 oz (117.8 kg) IBW/kg (Calculated) : 61.5  Heparin Dosing Weight: 90kg  Vital Signs: Temp: 98.1 F (36.7 C) (06/03 1926) Temp src: Oral (06/03 1926) BP: 129/76 mmHg (06/03 1900) Pulse Rate: 87  (06/03 1900)  Labs:  Alvira Philips 08/07/11 0949 08/06/11 0655 08/05/11 2323 08/05/11 1817 08/05/11 1327  HGB -- 11.2* -- -- 11.5*  HCT -- 34.5* -- -- 35.9*  PLT -- 78* -- -- 84*  APTT -- -- -- -- --  LABPROT -- 14.0 -- -- 15.4*  INR -- 1.06 -- -- 1.19  HEPARINUNFRC -- -- -- -- --  CREATININE 0.84 1.39* -- -- 1.74*  CKTOTAL -- 412* 517* 594* --  CKMB -- 3.5 3.5 3.6 --  TROPONINI -- 0.54* <0.30 <0.30 --    Estimated Creatinine Clearance: 83.3 ml/min (by C-G formula based on Cr of 0.84).   Medical History: Past Medical History  Diagnosis Date  . Coronary artery disease   . Diabetes mellitus   . Hypertension     Medications:  Infusions:    . sodium chloride 50 mL/hr at 08/07/11 0814  . diltiazem (CARDIZEM) infusion 5 mg/hr (08/07/11 1915)  . heparin    . DISCONTD: DOPamine Stopped (08/07/11 1040)    Assessment: 78 year old male with known history of CAD presented with weakness and night sweats. Patient converted into afib with RVR with HR in 130's. Patient started on diltiazem drip and pharmacy was consulted to start IV heparin. Baseline coags are appropriate.  Goal of Therapy:  Heparin level 0.3-0.7 units/ml Monitor platelets by anticoagulation protocol: Yes   Plan:  1.Heparin bolus of 4000 units x1 2.Heparin drip at 1100 units/hr 3.Daily heparin level and cbc  Severiano Gilbert 08/07/2011,7:39 PM

## 2011-08-08 LAB — URINE CULTURE
Colony Count: 3000
Culture  Setup Time: 201306021927

## 2011-08-08 LAB — CBC
HCT: 35.7 % — ABNORMAL LOW (ref 39.0–52.0)
Hemoglobin: 11.5 g/dL — ABNORMAL LOW (ref 13.0–17.0)
MCHC: 32.2 g/dL (ref 30.0–36.0)
RBC: 4.31 MIL/uL (ref 4.22–5.81)

## 2011-08-08 LAB — GLUCOSE, CAPILLARY
Glucose-Capillary: 106 mg/dL — ABNORMAL HIGH (ref 70–99)
Glucose-Capillary: 100 mg/dL — ABNORMAL HIGH (ref 70–99)
Glucose-Capillary: 100 mg/dL — ABNORMAL HIGH (ref 70–99)

## 2011-08-08 LAB — HEPARIN LEVEL (UNFRACTIONATED)
Heparin Unfractionated: <0.1 IU/mL — ABNORMAL LOW (ref 0.30–0.70)
Heparin Unfractionated: 0.11 IU/mL — ABNORMAL LOW (ref 0.30–0.70)

## 2011-08-08 MED ORDER — HEPARIN BOLUS VIA INFUSION
2000.0000 [IU] | Freq: Once | INTRAVENOUS | Status: AC
  Administered 2011-08-08: 2000 [IU] via INTRAVENOUS
  Filled 2011-08-08: qty 2000

## 2011-08-08 MED ORDER — HEPARIN (PORCINE) IN NACL 100-0.45 UNIT/ML-% IJ SOLN
1700.0000 [IU]/h | INTRAMUSCULAR | Status: DC
  Administered 2011-08-09: 1700 [IU]/h via INTRAVENOUS
  Filled 2011-08-08 (×4): qty 250

## 2011-08-08 MED ORDER — ATORVASTATIN CALCIUM 10 MG PO TABS
10.0000 mg | ORAL_TABLET | Freq: Every day | ORAL | Status: DC
  Administered 2011-08-08 – 2011-08-14 (×7): 10 mg via ORAL
  Filled 2011-08-08 (×9): qty 1

## 2011-08-08 NOTE — Evaluation (Signed)
Physical Therapy Evaluation Patient Details Name: Terry Robertson MRN: [id number] DOB: 1928-11-06 Today's Date: 08/08/2011 Time: 1340-1420 PT Time Calculation (min): 40 min  PT Assessment / Plan / Recommendation Clinical Impression  Pt admitted for hypotension, SOB, and CP. Findings included 100% occulded RCA. Currently, pt presents with decreased activity tolerance, functional mobility, coordination, difficulty word finding, and balance impairments. Discussed symptoms with nurse due to differing presentaion from admission. Pt will benefit from skilled PT to address below impairments and reduce falls risk and caregiver burden at next venue of care.     PT Assessment  Patient needs continued PT services    Follow Up Recommendations  Skilled nursing facility;Supervision/Assistance - 24 hour    Barriers to Discharge Decreased caregiver support Pt not confident he would have 24hr care available    lEquipment Recommendations  Rolling walker with 5" wheels    Recommendations for Other Services OT consult   Frequency Min 3X/week    Precautions / Restrictions Precautions Precautions: Fall Restrictions Weight Bearing Restrictions: No   Pertinent Vitals/Pain VSS throughout, no c/o pain      Mobility  Bed Mobility Bed Mobility: Supine to Sit Supine to Sit: 4: Min assist;HOB flat;With rails Details for Bed Mobility Assistance: Min A for lifting trunk, increased time required Transfers Transfers: Sit to Stand;Stand to Sit Sit to Stand: 3: Mod assist;With upper extremity assist;From bed Stand to Sit: 4: Min assist;With upper extremity assist;With armrests;To chair/3-in-1 Details for Transfer Assistance: Multiple attempts and lifting assist needed. Step-by-step cues for hand placement for sit and standing. Cues to fully complete turn prior to sitting Ambulation/Gait Ambulation/Gait Assistance: 3: Mod assist Ambulation Distance (Feet): 15 Feet Assistive device: Rolling walker Ambulation/Gait  Assistance Details: Verbal cues and manual facilitaiton for R hand on grip, as it slid offf. Mod steadying assist due to R LOB x2 Gait Pattern: Step-through pattern;Decreased stride length;Shuffle Gait velocity: Decreased General Gait Details: Pt declining further gait due to fatigue Stairs: No Wheelchair Mobility Wheelchair Mobility: No         PT Diagnosis: Abnormality of gait;Generalized weakness  PT Problem List: Decreased activity tolerance;Decreased balance;Decreased mobility;Decreased coordination;Decreased cognition;Decreased knowledge of use of DME;Decreased safety awareness;Decreased knowledge of precautions;Obesity PT Treatment Interventions: DME instruction;Gait training;Stair training;Functional mobility training;Therapeutic activities;Therapeutic exercise;Balance training;Neuromuscular re-education;Cognitive remediation;Patient/family education   PT Goals Acute Rehab PT Goals PT Goal Formulation: With patient Time For Goal Achievement: 08/22/11 Potential to Achieve Goals: Good Pt will go Supine/Side to Sit: with modified independence;with HOB 0 degrees PT Goal: Supine/Side to Sit - Progress: Goal set today Pt will go Sit to Supine/Side: with modified independence;with HOB 0 degrees PT Goal: Sit to Supine/Side - Progress: Goal set today Pt will go Sit to Stand: with supervision;with upper extremity assist PT Goal: Sit to Stand - Progress: Goal set today Pt will go Stand to Sit: with supervision;with upper extremity assist PT Goal: Stand to Sit - Progress: Goal set today Pt will Transfer Bed to Chair/Chair to Bed: with supervision PT Transfer Goal: Bed to Chair/Chair to Bed - Progress: Goal set today Pt will Ambulate: with supervision;with least restrictive assistive device;51 - 150 feet PT Goal: Ambulate - Progress: Goal set today Pt will Go Up / Down Stairs: 1-2 stairs;with supervision;with rail(s) PT Goal: Up/Down Stairs - Progress: Goal set today  Visit Information   Last PT Received On: 08/08/11    Subjective Data  Subjective: Pt with difficulty word finding Patient Stated Goal: Go home to take care of himself   Prior Functioning  Home Living Lives With: Alone;Son ("My son's supposed to be stay with me.") Available Help at Discharge: Family;Friend(s);Available PRN/intermittently Type of Home: House Home Access: Stairs to enter [employer] of Steps: 2 Entrance Stairs-Rails: Can reach both Home Layout: One level Home Adaptive Equipment: Straight cane;Shower chair without back Additional Comments: Pt reporting that he will not have 24hr S/assist at home Prior Function Level of Independence: Independent with assistive device(s) (Used straight cane) Able to Take Stairs?: Yes Driving: Yes Vocation: Unemployed Communication Communication: Expressive difficulties (Significant word finding difficulty)    Cognition  Overall Cognitive Status: Impaired Area of Impairment: Following commands;Safety/judgement;Awareness of errors;Awareness of deficits;Problem solving Arousal/Alertness: Awake/alert Orientation Level: Appears intact for tasks assessed Behavior During Session: [facility] for tasks performed Following Commands: Follows one step commands inconsistently Safety/Judgement: Decreased awareness of safety precautions;Decreased safety judgement for tasks assessed Safety/Judgement - Other Comments: Pt did wait until therapist fully ready to being mobility, but began to sit in chair before fully turning Problem Solving: Pt was left in chair with meal, but when PT returned, pt was eating food with fingers, not recognizing utensils on tray    Extremity/Trunk Assessment Right Lower Extremity Assessment RLE ROM/Strength/Tone: Within functional levels RLE Coordination: Deficits RLE Coordination Deficits: Pt unable to follow instructions for toe tapping, continue to LAQ. When asked to heel shin slide, pt with decreased motor planning  Left Lower  Extremity Assessment LLE ROM/Strength/Tone: Within functional levels LLE Coordination: Deficits LLE Coordination Deficits: Pt unable to follow instructions for toe tapping, continue to LAQ. When asked to heel shin slide, pt with decreased motor planning  Trunk Assessment Trunk Assessment: Normal   Balance Balance Balance Assessed: Yes Static Sitting Balance Static Sitting - Balance Support: No upper extremity supported;Feet supported Static Sitting - Level of Assistance: 5: Stand by assistance Static Sitting - Comment/# of Minutes: while setting up equipment Dynamic Sitting Balance Dynamic Sitting - Balance Support: Right upper extremity supported;Left upper extremity supported;Feet supported;During functional activity Dynamic Sitting - Level of Assistance: 4: Min assist Static Standing Balance Static Standing - Balance Support: Bilateral upper extremity supported Static Standing - Level of Assistance: 4: Min assist Static Standing - Comment/# of Minutes: EOB acclimating to change in position. Pt had no orthostatic symptoms or changes  End of Session PT - End of Session Equipment Utilized During Treatment: Gait belt;Oxygen Activity Tolerance: Patient tolerated treatment well Patient left: in chair;with call bell/phone within reach Nurse Communication: Mobility status;Other (comment) (Concern with cognitive impairment, as well as coordination)   Virl Cagey, [place] [phone]  08/08/2011, 2:36 PM

## 2011-08-08 NOTE — Progress Notes (Signed)
ANTICOAGULATION CONSULT NOTE   Pharmacy Consult for heparin Indication: atrial fibrillation  No Known Allergies  Patient Measurements: Height: 5\' 5"  (165.1 cm) Weight: 254 lb 3.1 oz (115.3 kg) IBW/kg (Calculated) : 61.5  Heparin Dosing Weight: 90kg  Vital Signs: Temp: 98.5 F (36.9 C) (06/04 0327) Temp src: Oral (06/04 0327) BP: 137/75 mmHg (06/04 0600) Pulse Rate: 92  (06/04 0600)  Labs:  Alvira Philips 08/08/11 0511 08/07/11 0949 08/06/11 0655 08/05/11 2323 08/05/11 1817 08/05/11 1327  HGB 11.5* -- 11.2* -- -- --  HCT 35.7* -- 34.5* -- -- 35.9*  PLT 139* -- 78* -- -- 84*  APTT -- -- -- -- -- --  LABPROT -- -- 14.0 -- -- 15.4*  INR -- -- 1.06 -- -- 1.19  HEPARINUNFRC <0.10* -- -- -- -- --  CREATININE -- 0.84 1.39* -- -- 1.74*  CKTOTAL -- -- 412* 517* 594* --  CKMB -- -- 3.5 3.5 3.6 --  TROPONINI -- -- 0.54* <0.30 <0.30 --    Estimated Creatinine Clearance: 82.3 ml/min (by C-G formula based on Cr of 0.84).  Assessment: 78 year old male with afib for heparin.   Goal of Therapy:  Heparin level 0.3-0.7 units/ml Monitor platelets by anticoagulation protocol: Yes   Plan:  Heparin 2000 units IV bolus, then increase heparin 1400 units/hr Check heparin level in 8 hours.  Sayde Lish, Gary Fleet 08/08/2011,7:50 AM

## 2011-08-08 NOTE — Progress Notes (Signed)
[facility]  Subjective: No CP.  Breathing is about the same.  Objective: Vital signs in last 24 hours: Temp:  [97.9 F (36.6 C)-99 F (37.2 C)] 97.9 F (36.6 C) (06/04 0800) Pulse Rate:  [47-160] 90  (06/04 0800) Resp:  [21-42] 21  (06/04 0800) BP: (106-155)/(50-99) 132/75 mmHg (06/04 0800) SpO2:  [91 %-96 %] 94 % (06/04 0800) Weight:  [115.3 kg (254 lb 3.1 oz)] 115.3 kg (254 lb 3.1 oz) (06/04 0400) Last BM Date: 08/05/11  Intake/Output from previous day: 06/03 0701 - 06/04 0700 In: 1105.3 [I.V.:1105.3] Out: 550 [Urine:550] Intake/Output this shift: Total I/O In: 128.4 [P.O.:100; I.V.:28.4] Out: 225 [Urine:225]  Medications Current Facility-Administered Medications  Medication Dose Route Frequency Provider Last Rate Last Dose  . 0.9 %  sodium chloride infusion   Intravenous Continuous Marykay Lex, MD 50 mL/hr at 08/07/11 [phone]    . acetaminophen (TYLENOL) tablet 650 mg  650 mg Oral Q4H PRN Wilburt Finlay, PA      . ALPRAZolam Prudy Feeler) tablet 0.25 mg  0.25 mg Oral TID PRN Abelino Derrick, PA   0.25 mg at 08/06/11 2303  . aspirin EC tablet 81 mg  81 mg Oral Daily Kendra P Hiatt, PHARMD   81 mg at 08/07/11 1036  . diltiazem (CARDIZEM) 100 mg in dextrose 5 % 100 mL infusion  5-15 mg/hr Intravenous Titrated Runell Gess, MD 15 mL/hr at 08/08/11 0819 15 mg/hr at 08/08/11 0819  . heparin ADULT infusion 100 units/mL (25000 units/250 mL)  1,400 Units/hr Intravenous Continuous Marykay Lex, MD 14 mL/hr at 08/08/11 0813 1,400 Units/hr at 08/08/11 0813  . heparin bolus via infusion 2,000 Units  2,000 Units Intravenous Once Marykay Lex, MD   2,000 Units at 08/08/11 0815  . heparin bolus via infusion 4,000 Units  4,000 Units Intravenous Once Severiano Gilbert, PHARMD   4,000 Units at 08/07/11 2027  . morphine 2 MG/ML injection 2 mg  2 mg Intravenous Q4H PRN Marykay Lex, MD   2 mg at 08/06/11 2205  . nitroGLYCERIN (NITROSTAT) SL tablet 0.4 mg  0.4  mg Sublingual Q5 Min x 3 PRN Wilburt Finlay, PA   0.4 mg at 08/06/11 1150  . omega-3 acid ethyl esters (LOVAZA) capsule 1 g  1 g Oral BID Wilburt Finlay, PA   1 g at 08/07/11 2337  . ondansetron (ZOFRAN) injection 4 mg  4 mg Intravenous Q6H PRN Wilburt Finlay, PA      . simvastatin (ZOCOR) tablet 20 mg  20 mg Oral Daily Wilburt Finlay, PA   20 mg at 08/07/11 1036  . STUDY MEDICATION 1 capsule  1 capsule Oral QAC breakfast [name], PHARMD   1 capsule at 08/08/11 0800  . STUDY MEDICATION 1 capsule  1 capsule Oral QAC breakfast [name], PHARMD   1 capsule at 08/08/11 0800  . traMADol (ULTRAM) tablet 50 mg  50 mg Oral Q6H PRN Abelino Derrick, PA      . zolpidem (AMBIEN) tablet 5 mg  5 mg Oral QHS PRN Abelino Derrick, PA      . DISCONTD: DOPamine (INTROPIN) 800 mg in dextrose 5 % 250 mL infusion  5 mcg/kg/min Intravenous Titrated Wilburt Finlay, PA   3 mcg/kg/min at 08/07/11 0800    PE: General appearance: alert, cooperative and no distress Lungs: clear to auscultation bilaterally, decreased BS and effort,  No wheeze Heart: irregularly irregular rhythm Abdomen: BS all quads,  Nontender Extremities: No LEE Pulses: 2+ and symmetric Neuro: Alert.  CN 2-12 ok.  Strength 45 and equal.  Lab Results:   Basename 08/08/11 0511 08/06/11 0655 08/05/11 1327  WBC 4.5 7.4 9.2  HGB 11.5* 11.2* 11.5*  HCT 35.7* 34.5* 35.9*  PLT 139* 78* 84*   BMET  Basename 08/07/11 0949 08/06/11 0655 08/05/11 1327  NA 138 135 134*  K 3.6 3.6 4.1  CL 103 100 98  CO2 25 24 25   GLUCOSE 114* 124* 168*  BUN 21 36* 29*  CREATININE 0.84 1.39* 1.74*  CALCIUM 8.6 8.5 8.8   PT/INR  Basename 08/06/11 0655 08/05/11 1327  LABPROT 14.0 15.4*  INR 1.06 1.19   Cholesterol  Basename 08/06/11 0655  CHOL 121    Assessment/Plan  Principal Problem:  *Hypotension Active Problems:  CAD (coronary artery disease) PCI to circumflex in 1997.  100% occluded RCA  Morbid obesity  Myelodysplastic syndrome   Dyslipidemia  HTN (hypertension)  Volume overload  S/P TURP Oct 2012  DM type 2 (diabetes mellitus, type 2)  Diastolic dysfunction: Grade 2  Thrombocytopenia  Plan:  BNP decreased from 11,000 to 1250.  SCr has improved greatly.  Now in Afib rate controlled on IV dilt and heparin.  PT coming back today.  Will need long term anticoagulation.  Consider DCCV if he does not convert.   LOS: 3 days    HAGER, BRYAN 08/08/2011 9:01 AM   Agree with note written by Jones Skene PAC  Pt developed AFIB with RVR last night treated with IV hep/dilt. VSS. HR decreased. BNP decreased and Scr improved. Exam benign. Pt denies CP/SOB. Agree with PT/OT. Will keep in TCU for now, transition to PO dilt tomorrow as well as oral anticoagulant.   Runell Gess 08/08/2011 11:45 AM

## 2011-08-08 NOTE — Clinical Documentation Improvement (Signed)
GENERIC DOCUMENTATION CLARIFICATION QUERY  THIS DOCUMENT IS NOT A PERMANENT PART OF THE MEDICAL RECORD  TO RESPOND TO THE THIS QUERY, FOLLOW THE INSTRUCTIONS BELOW:  1. If needed, update documentation for the patient's encounter via the notes activity.  2. Access this query again and click edit on the In [employer].  3. After updating, or not, click F2 to complete all highlighted (required) fields concerning your review. Select "additional documentation in the medical record" OR "no additional documentation provided".  4. Click Sign note button.  5. The deficiency will fall out of your In Basket *Please let us know if you are not able to complete this workflow by phone or e-mail (listed below).  Please update your documentation within the medical record to reflect your response to this query.                                                                                        08/08/11   Dear Dr.Artez Regis / Associates,  In a better effort to capture your patient's severity of illness, reflect appropriate length of stay and utilization of resources, a review of the patient medical record has revealed the following indicators.    Based on your clinical judgment, please clarify and document in a progress note and/or discharge summary the clinical condition associated with the following supporting information:  In responding to this query please exercise your independent judgment.  The fact that a query is asked, does not imply that any particular answer is desired or expected.  Possible Clinical Conditions?  _______Pleural Effusion   _______Other Condition  _______Cannot Clinically Determine    Supporting Information:  Signs & Symptoms: Noted shortness of breath, trace LE edema, volume overload, and orthopnea per 6/01 progress notes.   Diagnostics: 6/01:  ProBNP:  11,328 6/02:  2D echo: probable small Left pleural effusion. 6/04:  ProBNP:    1,250   You may use possible,  probable, or suspect with inpatient documentation. possible, probable, suspected diagnoses MUST be documented at the time of discharge  Reviewed: additional documentation in the medical record  Thank You,  Marciano Sequin,  Clinical Documentation Specialist:  Pager: [phone]  [facility]

## 2011-08-08 NOTE — Progress Notes (Signed)
ANTICOAGULATION CONSULT NOTE - Follow Up Consult  Pharmacy Consult for Heparin Indication: atrial fibrillation  No Known Allergies  Patient Measurements: Heparin Dosing Weight: 90kg  Vital Signs: Temp: 98.6 F (37 C) (06/04 1200) Temp src: Oral (06/04 0800) BP: 134/70 mmHg (06/04 1200) Pulse Rate: 39  (06/04 1400)  Labs:  Basename 08/08/11 1343 08/08/11 0511 08/07/11 0949 08/06/11 0655 08/05/11 2323 08/05/11 1817  HGB -- 11.5* -- 11.2* -- --  HCT -- 35.7* -- 34.5* -- --  PLT -- 139* -- 78* -- --  APTT -- -- -- -- -- --  LABPROT -- -- -- 14.0 -- --  INR -- -- -- 1.06 -- --  HEPARINUNFRC 0.11* <0.10* -- -- -- --  CREATININE -- -- 0.84 1.39* -- --  CKTOTAL -- -- -- 412* 517* 594*  CKMB -- -- -- 3.5 3.5 3.6  TROPONINI -- -- -- 0.54* <0.30 <0.30    Estimated Creatinine Clearance: 82.3 ml/min (by C-G formula based on Cr of 0.84).  Medications:  Heparin @ 1400 units/hr  Assessment: Terry Robertson continues on heparin for new onset afib. Heparin level remains below goal despite re-bolus and rate increase this morning. No issues with infusion noted. No bleeding per chart notes. Hgb stable, platelets improving.  Goal of Therapy:  Heparin level 0.3-0.7 units/ml Monitor platelets by anticoagulation protocol: Yes   Plan:  1) Re-bolus heparin 2000 units 2) Increase heparin gtt to 1700 units/hr 3) 8h heparin level  Terry Robertson 08/08/2011,3:50 PM

## 2011-08-09 ENCOUNTER — Inpatient Hospital Stay (HOSPITAL_COMMUNITY): Payer: Medicare Other

## 2011-08-09 DIAGNOSIS — I639 Cerebral infarction, unspecified: Secondary | ICD-10-CM | POA: Diagnosis not present

## 2011-08-09 DIAGNOSIS — I6340 Cerebral infarction due to embolism of unspecified cerebral artery: Secondary | ICD-10-CM

## 2011-08-09 DIAGNOSIS — R4789 Other speech disturbances: Secondary | ICD-10-CM

## 2011-08-09 DIAGNOSIS — D649 Anemia, unspecified: Secondary | ICD-10-CM

## 2011-08-09 LAB — CBC
HCT: 35.6 % — ABNORMAL LOW (ref 39.0–52.0)
Hemoglobin: 11.4 g/dL — ABNORMAL LOW (ref 13.0–17.0)
MCH: 26.8 pg (ref 26.0–34.0)
RBC: 4.26 MIL/uL (ref 4.22–5.81)

## 2011-08-09 LAB — BASIC METABOLIC PANEL
Chloride: 104 mEq/L (ref 96–112)
GFR calc Af Amer: >90 mL/min (ref >90)
Potassium: 4 mEq/L (ref 3.5–5.1)

## 2011-08-09 LAB — HEPARIN LEVEL (UNFRACTIONATED): Heparin Unfractionated: 0.47 IU/mL (ref 0.30–0.70)

## 2011-08-09 LAB — HEMOGLOBIN A1C: Hgb A1c MFr Bld: 5.9 % — ABNORMAL HIGH (ref <5.7)

## 2011-08-09 LAB — GLUCOSE, CAPILLARY: Glucose-Capillary: 93 mg/dL (ref 70–99)

## 2011-08-09 MED ORDER — DILTIAZEM HCL 60 MG PO TABS
60.0000 mg | ORAL_TABLET | Freq: Three times a day (TID) | ORAL | Status: AC
  Administered 2011-08-09 – 2011-08-13 (×15): 60 mg via ORAL
  Filled 2011-08-09 (×18): qty 1

## 2011-08-09 MED ORDER — HEPARIN BOLUS VIA INFUSION
2500.0000 [IU] | Freq: Once | INTRAVENOUS | Status: AC
  Administered 2011-08-09: 2500 [IU] via INTRAVENOUS
  Filled 2011-08-09: qty 2500

## 2011-08-09 NOTE — Plan of Care (Signed)
Problem: Phase I Progression Outcomes Goal: Voiding-avoid urinary catheter unless indicated Outcome: Completed/Met Date Met:  08/09/11 Use of condom cath

## 2011-08-09 NOTE — Clinical Social Work Psychosocial (Addendum)
    Clinical Social Work Department BRIEF PSYCHOSOCIAL ASSESSMENT 08/09/2011  Patient:  Terry Robertson, Terry Robertson     Account Number:  [id number]     Admit date:  08/05/2011  Clinical Social Worker:  Hulan Fray  Date/Time:  08/09/2011 10:03 AM  Referred by:  Physician  Date Referred:  08/09/2011 Referred for  SNF Placement   Other Referral:   Interview type:  Other - See comment Other interview type:   Patient and daughter, Terry Robertson    PSYCHOSOCIAL DATA Living Status:  FAMILY Admitted from facility:   Level of care:   Primary support name:  Terry Robertson Primary support relationship to patient:  CHILD, ADULT Degree of support available:   supportive    CURRENT CONCERNS Current Concerns  Post-Acute Placement   Other Concerns:    SOCIAL WORK ASSESSMENT / PLAN Clinical Social Worker received referral for SNF placement for patient. CSW introduced self and explained reason for visit. Patient and patient's daughter Terry Robertson was at bedside. Terry Robertson is visiting from [place]. Patient stated that he needed more time to think about possible SNF placement at discharge. Per Terry Robertson, more family members will be arriving today and the plan is for them to make decisions as a group. Daughter requested information on the SNF placement. CSW provided daughter and patient with the SNF packet and the Nursing Facility handbook.   Assessment/plan status:  Psychosocial Support/Ongoing Assessment of Needs Other assessment/ plan:  11:41am CSW met with Terry Robertson, son and he is agreeable for CSW to fax patient's information to [facility]. SNF's to see the availability. CSW will complete FL2 for MD's signature and update patient and family regarding the bed offers received.  Information/referral to community resources:   1) SNF packet  2) Nursing Facility Handbook    PATIENT'S/FAMILY'S RESPONSE TO PLAN OF CARE: Patient and family are going to discuss the option of SNF placement as a group. Patient's son, Terry Robertson, and  daughter Terry Robertson are agreeable for short term SNF placement.

## 2011-08-09 NOTE — Progress Notes (Signed)
*  PRELIMINARY RESULTS* Vascular Ultrasound Carotid Duplex (Doppler) has been completed.  Preliminary findings: Bilaterally no significant ICA stenosis with antegrade vertebral flow.  Farrel Demark, RDMS 08/09/2011, 2:33 PM

## 2011-08-09 NOTE — Consult Note (Addendum)
[facility] CONSULT [facility]     Reason for Consult: stroke    HPI:    Terry Robertson is an 78 y.o. male who was admitted to the hospital 08/05/11 for 2 day history of weakness, night sweats and CP. On admission patient was hypotensive 67/40 labs showed elevated BUN and creatinine.  2 D echo showed EF 55-65% with normal wall motion and grade 2 diastolic dysfunction. On 08/07/2011 patient converted to A fib with RVR patient was given heparin bolus followed by heparin drip and diltiazem. 08/09/11 patient was noted to be more confused with speech difficulty.  CT head obtained demonstrating 3 cm acute non hemorrhagic left parietal MCA territory infarction without hemorrhage or significant mass effect.     .   Past Medical History  Diagnosis Date  . Coronary artery disease   . Diabetes mellitus   . Hypertension     Past Surgical History  Procedure Date  . Cardiac surgery     History reviewed. No pertinent family history.  Social History:  reports that he quit smoking about 23 years ago. His smoking use included Cigarettes. He does not have any smokeless tobacco history on file. He reports that he does not use illicit drugs. His alcohol history not on file.  No Known Allergies  Medications:    Prior to Admission:  Prescriptions prior to admission  Medication Sig Dispense Refill  . amLODipine (NORVASC) 10 MG tablet Take 10 mg by mouth daily.      Marland Kitchen aspirin 81 MG tablet Take 81 mg by mouth daily.      Marland Kitchen atenolol (TENORMIN) 50 MG tablet Take 50 mg by mouth daily.      Marland Kitchen gabapentin (NEURONTIN) 100 MG capsule Take 200 mg by mouth at bedtime.       . hydrochlorothiazide (HYDRODIURIL) 25 MG tablet Take 25 mg by mouth daily.      Marland Kitchen HYDROcodone-acetaminophen (NORCO) 7.5-325 MG per tablet Take 1 tablet by mouth every 6 (six) hours as needed. For pain      . isosorbide dinitrate (ISORDIL) 30 MG tablet Take 30 mg by mouth daily.      Marland Kitchen lisinopril (PRINIVIL,ZESTRIL) 40 MG  tablet Take 40 mg by mouth daily.      . methocarbamol (ROBAXIN) 500 MG tablet Take 500 mg by mouth 2 (two) times daily as needed. Muscle spasms      . omega-3 acid ethyl esters (LOVAZA) 1 G capsule Take 1 g by mouth 2 (two) times daily.      . simvastatin (ZOCOR) 20 MG tablet Take 20 mg by mouth daily.      . STUDY MEDICATION Take 1 capsule by mouth daily. 2 bottles of a Study Drug for gout takes 1 capsule from each bottle once daily      . terazosin (HYTRIN) 2 MG capsule Take 2 mg by mouth daily.       Scheduled:   . aspirin EC  81 mg Oral Daily  . atorvastatin  10 mg Oral q1800  . diltiazem  60 mg Oral Q8H  . heparin  2,000 Units Intravenous Once  . heparin  2,500 Units Intravenous Once  . omega-3 acid ethyl esters  1 g Oral BID  . STUDY MEDICATION 1 capsule  1 capsule Oral QAC breakfast  . STUDY MEDICATION 1 capsule  1 capsule Oral QAC breakfast  Review of Systems - General ROS: negative for - chills, fatigue, fever or hot flashes Hematological and Lymphatic ROS: negative for - bruising, fatigue, jaundice or pallor Endocrine ROS: negative for - hair pattern changes, hot flashes, mood swings or skin changes Respiratory ROS: negative for - cough, hemoptysis, orthopnea or wheezing Cardiovascular ROS: negative for - dyspnea on exertion, orthopnea, palpitations or shortness of breath Gastrointestinal ROS: negative for - abdominal pain, appetite loss, blood in stools, diarrhea or hematemesis Musculoskeletal ROS: negative for - joint pain, joint stiffness, joint swelling or muscle pain Neurological ROS: positive for - speech problems Dermatological ROS: negative for dry skin, pruritus and rash   Blood pressure 140/62, pulse 96, temperature 98.5 F (36.9 C), temperature source Oral, resp. rate 32, height 5\' 5"  (1.651 m), weight 118.9 kg (262 lb 2 oz), SpO2 91.00%.   Neurologic Examination:   Mental Status: Alert,  Speech fluent and can follow verbal commands but unable to name  objects and repeat words.  Cranial Nerves: II-Visual fields grossly intact. III/IV/VI-Extraocular movements intact.  Pupils reactive bilaterally. V/VII-Smile symmetric VIII-grossly intact IX/X-normal gag XI-bilateral shoulder shrug XII-midline tongue extension Motor: 5/5 bilaterally with normal tone and bulk Sensory: Decreased sensation on right arm and leg Deep Tendon Reflexes: Depressed throughout Plantars downgoing bilaterally Cerebellar: Normal finger-to-nose, normal rapid alternating movements and normal heel-to-shin test.      Lab Results  Component Value Date/Time   CHOL 121 08/06/2011  6:55 AM    Results for orders placed during the hospital encounter of 08/05/11 (from the past 48 hour(s))  GLUCOSE, CAPILLARY     Status: Abnormal   Collection Time   08/07/11  4:10 PM      Component Value Range Comment   Glucose-Capillary 103 (*) 70 - 99 (mg/dL)   GLUCOSE, CAPILLARY     Status: Abnormal   Collection Time   08/07/11  9:56 PM      Component Value Range Comment   Glucose-Capillary 106 (*) 70 - 99 (mg/dL)   HEPARIN LEVEL (UNFRACTIONATED)     Status: Abnormal   Collection Time   08/08/11  5:11 AM      Component Value Range Comment   Heparin Unfractionated <0.10 (*) 0.30 - 0.70 (IU/mL)   CBC     Status: Abnormal   Collection Time   08/08/11  5:11 AM      Component Value Range Comment   WBC 4.5  4.0 - 10.5 (K/uL)    RBC 4.31  4.22 - 5.81 (MIL/uL)    Hemoglobin 11.5 (*) 13.0 - 17.0 (g/dL)    HCT 45.4 (*) 09.8 - 52.0 (%)    MCV 82.8  78.0 - 100.0 (fL)    MCH 26.7  26.0 - 34.0 (pg)    MCHC 32.2  30.0 - 36.0 (g/dL)    RDW 11.9  14.7 - 82.9 (%)    Platelets 139 (*) 150 - 400 (K/uL)   GLUCOSE, CAPILLARY     Status: Abnormal   Collection Time   08/08/11  8:03 AM      Component Value Range Comment   Glucose-Capillary 100 (*) 70 - 99 (mg/dL)   GLUCOSE, CAPILLARY     Status: Abnormal   Collection Time   08/08/11 12:04 PM      Component Value Range Comment   Glucose-Capillary 101 (*)  70 - 99 (mg/dL)   HEPARIN LEVEL (UNFRACTIONATED)     Status: Abnormal   Collection Time   08/08/11  1:43 PM  Component Value Range Comment   Heparin Unfractionated 0.11 (*) 0.30 - 0.70 (IU/mL)   GLUCOSE, CAPILLARY     Status: Abnormal   Collection Time   08/08/11  5:13 PM      Component Value Range Comment   Glucose-Capillary 100 (*) 70 - 99 (mg/dL)   GLUCOSE, CAPILLARY     Status: Abnormal   Collection Time   08/08/11  9:44 PM      Component Value Range Comment   Glucose-Capillary 100 (*) 70 - 99 (mg/dL)   HEPARIN LEVEL (UNFRACTIONATED)     Status: Abnormal   Collection Time   08/08/11 11:56 PM      Component Value Range Comment   Heparin Unfractionated 0.11 (*) 0.30 - 0.70 (IU/mL)   GLUCOSE, CAPILLARY     Status: Normal   Collection Time   08/09/11  7:51 AM      Component Value Range Comment   Glucose-Capillary 93  70 - 99 (mg/dL)   CBC     Status: Abnormal   Collection Time   08/09/11 10:01 AM      Component Value Range Comment   WBC 5.4  4.0 - 10.5 (K/uL)    RBC 4.26  4.22 - 5.81 (MIL/uL)    Hemoglobin 11.4 (*) 13.0 - 17.0 (g/dL)    HCT 62.1 (*) 30.8 - 52.0 (%)    MCV 83.6  78.0 - 100.0 (fL)    MCH 26.8  26.0 - 34.0 (pg)    MCHC 32.0  30.0 - 36.0 (g/dL)    RDW 65.7  84.6 - 96.2 (%)    Platelets 154  150 - 400 (K/uL)   HEPARIN LEVEL (UNFRACTIONATED)     Status: Normal   Collection Time   08/09/11 10:01 AM      Component Value Range Comment   Heparin Unfractionated 0.47  0.30 - 0.70 (IU/mL)   BASIC METABOLIC PANEL     Status: Abnormal   Collection Time   08/09/11 10:01 AM      Component Value Range Comment   Sodium 137  135 - 145 (mEq/L)    Potassium 4.0  3.5 - 5.1 (mEq/L)    Chloride 104  96 - 112 (mEq/L)    CO2 25  19 - 32 (mEq/L)    Glucose, Bld 123 (*) 70 - 99 (mg/dL)    BUN 12  6 - 23 (mg/dL)    Creatinine, Ser 9.52  0.50 - 1.35 (mg/dL)    Calcium 8.9  8.4 - 10.5 (mg/dL)    GFR calc non Af Amer 85 (*) >90 (mL/min)    GFR calc Af Amer >90  >90 (mL/min)     Ct  Head Wo Contrast  08/09/2011  *RADIOLOGY REPORT*  Clinical Data: Increasing confusion. Speech difficulty. Hypertension and  atrial fibrillation. Diabetes.  Morbid obesity.  CT HEAD WITHOUT CONTRAST  Technique:  Contiguous axial images were obtained from the base of the skull through the vertex without contrast.  Comparison: None.  Findings:  There is a moderate sized 3 cm hypodensity affecting the left posterior parietal cortex and subcortical white matter without hemorrhage or midline shift consistent with an acute left MCA territory infarction.  No CT signs of proximal Left MCA vascular occlusion.  Moderately advanced atrophy and chronic microvascular ischemic change is noted.  The calvarium is intact.  Clear sinuses and mastoids.  Mild vascular calcification.  Negative orbits.  IMPRESSION: 3 cm acute non hemorrhagic left parietal MCA territory infarction without hemorrhage or significant  mass effect.  Moderately advanced atrophy and chronic microvascular ischemic change.  I discussed the findings with the ordering provider shortly after completion of the study.  Original Report Authenticated By: Elsie Stain, M.D.     Assessment/Plan:    78 YO male with acute left MCA distribution infarct in setting of rate controlled A-fib.    Stroke Risk Factors - atrial fibrillation, diabetes mellitus and hypertension  Plan: 1. HgbA1c, fasting lipid panel 2. MRI, MRA  of the brain without contrast 3. PT consult, OT consult, Speech consult (NPO until passes swallow screen) 4. Carotid dopplers 6. Prophylactic therapy- Stop Heparin drip, Start coumadin per pharmacy and continue with aspirin to bridge with goal INR 2-3 7. Remain on Telemetry 8. Risk  Factor modification  I have spoken to [facility] PA-C about stopping Heparin drip, Starting coumadin PO per pharmacy and continue ASA to bridge Coumadin.  Felicie Morn PA-C [facility] [phone]  08/09/2011, 11:56 AM    Patient seen and  examined. I agree with the above.  Thana Farr, MD [facility] [phone]  08/09/2011  1:41 PM

## 2011-08-09 NOTE — Progress Notes (Addendum)
ANTICOAGULATION CONSULT NOTE   Pharmacy Consult for heparin Indication: atrial fibrillation  No Known Allergies  Patient Measurements: Height: 5\' 5"  (165.1 cm) Weight: 254 lb 3.1 oz (115.3 kg) IBW/kg (Calculated) : 61.5  Heparin Dosing Weight: 90kg  Vital Signs: Temp: 99.5 F (37.5 C) (06/05 0058) Temp src: Oral (06/05 0058) BP: 129/64 mmHg (06/04 2345) Pulse Rate: 71  (06/05 0058)  Labs:  Basename 08/08/11 2356 08/08/11 1343 08/08/11 0511 08/07/11 0949 08/06/11 0655  HGB -- -- 11.5* -- 11.2*  HCT -- -- 35.7* -- 34.5*  PLT -- -- 139* -- 78*  APTT -- -- -- -- --  LABPROT -- -- -- -- 14.0  INR -- -- -- -- 1.06  HEPARINUNFRC 0.11* 0.11* <0.10* -- --  CREATININE -- -- -- 0.84 1.39*  CKTOTAL -- -- -- -- 412*  CKMB -- -- -- -- 3.5  TROPONINI -- -- -- -- 0.54*    Estimated Creatinine Clearance: 82.3 ml/min (by C-G formula based on Cr of 0.84).  Assessment: 78 year old male with afib for heparin.   Goal of Therapy:  Heparin level 0.3-0.7 units/ml Monitor platelets by anticoagulation protocol: Yes   Plan:  Heparin 2500 units IV bolus, then increase heparin 2100 units/hr Follow-up am labs.   Eddie Candle 08/09/2011,1:07 AM  08/09/2011 2:25 AM RN reports arm tenderness and probable infiltration of Heparin IV site.  Decrease Heparin back to 1700 units/hr, recheck in am. Geannie Risen, PharmD, BCPS

## 2011-08-09 NOTE — Evaluation (Signed)
Speech Language Pathology Evaluation Patient Details Name: Terry Robertson MRN: [id number] DOB: 01/31/1929 Today's Date: 08/09/2011 Time: 4098-1191 SLP Time Calculation (min): 18 min  Problem List:  Patient Active Problem List  Diagnoses  . Hypotension  . CAD (coronary artery disease) PCI to circumflex in 1997.  100% occluded RCA  . Morbid obesity  . Myelodysplastic syndrome  . Dyslipidemia  . HTN (hypertension)  . Volume overload  . S/P TURP Oct 2012  . DM type 2 (diabetes mellitus, type 2)  . Diastolic dysfunction: Grade 2  . Thrombocytopenia  . Acute stroke - possibly  cardioembolic (08/09/2011)   Past Medical History:  Past Medical History  Diagnosis Date  . Coronary artery disease   . Diabetes mellitus   . Hypertension    Past Surgical History:  Past Surgical History  Procedure Date  . Cardiac surgery    HPI:  78 yr old admitted with weakness, night sweats.  CT revealed a left parietal MCA CVA.   Assessment / Plan / Recommendation Clinical Impression  Pt. exhibiting moderate expressive aphasia with pauses, hesitations, dysnomia with awareness of most errors.  Comprehension for mildly complex information is decreased.  Pt. exhibiting motor apraxia which interferes with accurate assessment of more complex comprehension.  ST will continue to assess comprehension in diagnostic therapy.  Difficulty in reading comprehension evident.  Pt. will benefit from ST services in acute care and post acute venue.      SLP Assessment  Patient needs continued Speech Lanaguage Pathology Services    Follow Up Recommendations  Outpatient SLP    Frequency and Duration min 2x/week  2 weeks       SLP Goals  SLP Goals Potential to Achieve Goals: Good SLP Goal #1: Pt. will express wants/thoughts for basic info in acute care with mod verbal/visual cues SLP Goal #2: Pt. will utilize strategies to relay intended message with mod verbal cues. SLP Goal #3: Pt. will demonstrate comprehension of  short phrases with min verbal cues SLP Goal #4: Pt. will increase attempts to self correct verbal errors with mod verbal prompts.  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?))   Cognition  Overall Cognitive Status: Impaired Arousal/Alertness: Awake/alert Orientation Level: Oriented to person;Disoriented to situation;Oriented to time Memory:  (will assess further) Awareness: Impaired Awareness Impairment: Anticipatory impairment;Emergent impairment Problem Solving: Impaired Problem Solving Impairment: Functional complex;Verbal complex Safety/Judgment: Appears intact    Comprehension  Auditory Comprehension Overall Auditory Comprehension: Impaired Yes/No Questions: Within Functional Limits (for basic) Commands: Impaired Two Step Basic Commands: 50-74% accurate (motor apraxia component in addition) Conversation: Complex Visual Recognition/Discrimination Discrimination: Not tested Reading Comprehension Reading Status:  (will assess)    Expression Expression Primary Mode of Expression: Verbal Verbal Expression Overall Verbal Expression: Impaired Initiation: No impairment Level of Generative/Spontaneous Verbalization: Phrase Repetition: Impaired Level of Impairment: Word level Naming: Impairment Responsive: Not tested Confrontation: Impaired Convergent: 25-49% accurate Divergent: Not tested Verbal Errors: Perseveration;Aware of errors (aware most of the time) Pragmatics: No impairment Written Expression Dominant Hand: Right Written Expression:  (will assess)   Oral / Motor Oral Motor/Sensory Function Overall Oral Motor/Sensory Function: Impaired Labial ROM:  (decr coordination) Labial Symmetry: Within Functional Limits Lingual ROM: Reduced right;Reduced left Lingual Symmetry: Abnormal symmetry left (??) Facial ROM: Within Functional  Limits Facial Symmetry: Within Functional Limits Velum: Within Functional Limits Mandible: Within Functional Limits Motor Speech Overall Motor Speech: Appears within functional limits for tasks assessed  Respiration: Within functional limits Phonation: Normal Resonance: Within functional limits Articulation: Within functional limitis Intelligibility: Intelligible Motor Planning: Impaired Level of Impairment: Word     Breck Coons [employer] [phone]  08/09/2011

## 2011-08-09 NOTE — Progress Notes (Signed)
ANTICOAGULATION CONSULT NOTE - Follow Up Consult  Pharmacy Consult for Heparin Indication: atrial fibrillation  No Known Allergies  Patient Measurements: Heparin Dosing Weight: 90kg  Vital Signs: Temp: 98.5 F (36.9 C) (06/05 0751) Temp src: Oral (06/05 0751) BP: 150/58 mmHg (06/05 0751) Pulse Rate: 66  (06/05 0751)  Labs:  Basename 08/09/11 1001 08/08/11 2356 08/08/11 1343 08/08/11 0511 08/07/11 0949  HGB 11.4* -- -- 11.5* --  HCT 35.6* -- -- 35.7* --  PLT 154 -- -- 139* --  APTT -- -- -- -- --  LABPROT -- -- -- -- --  INR -- -- -- -- --  HEPARINUNFRC 0.47 0.11* 0.11* -- --  CREATININE 0.74 -- -- -- 0.84  CKTOTAL -- -- -- -- --  CKMB -- -- -- -- --  TROPONINI -- -- -- -- --    Estimated Creatinine Clearance: 88 ml/min (by C-G formula based on Cr of 0.74).  Medications:  Heparin @ 1700 units/hr  Assessment: 82yom continues on heparin with a therapeutic heparin level. Noted events from last night (heparin infiltration). CBC remains stable. Now with new left parietal MCA non-hemorrhagic infarct.  Goal of Therapy:  Heparin level 0.3-0.7 units/ml Monitor platelets by anticoagulation protocol: Yes   Plan:  1) Continue heparin at 1700 units/hr 2) Follow up daily heparin level and CBC 3) Follow up plan for long-term anticoagulation  [name] 08/09/2011,11:03 AM

## 2011-08-09 NOTE — Progress Notes (Addendum)
[facility]  Subjective: No CP or SOB; More confused this AM-- stat Head CT performed.  Objective: Vital signs in last 24 hours: Temp:  [98.5 F (36.9 C)-100.1 F (37.8 C)] 98.5 F (36.9 C) (06/05 0751) Pulse Rate:  [39-95] 66  (06/05 0751) Resp:  [14-34] 24  (06/05 0751) BP: (120-150)/(57-70) 150/58 mmHg (06/05 0751) SpO2:  [92 %-100 %] 95 % (06/05 0751) Weight:  [118.9 kg (262 lb 2 oz)] 118.9 kg (262 lb 2 oz) (06/05 0443) Last BM Date: 08/05/11  Intake/Output from previous day: 06/04 0701 - 06/05 0700 In: 800.4 [P.O.:100; I.V.:700.4] Out: 1125 [Urine:1125] Intake/Output this shift: Total I/O In: 284 [P.O.:240; I.V.:44] Out: -   Medications Current Facility-Administered Medications  Medication Dose Route Frequency Provider Last Rate Last Dose  . acetaminophen (TYLENOL) tablet 650 mg  650 mg Oral Q4H PRN Wilburt Finlay, PA      . ALPRAZolam Prudy Feeler) tablet 0.25 mg  0.25 mg Oral TID PRN Abelino Derrick, PA   0.25 mg at 08/06/11 2303  . aspirin EC tablet 81 mg  81 mg Oral Daily Renaee Munda, PHARMD   81 mg at 08/08/11 1010  . atorvastatin (LIPITOR) tablet 10 mg  10 mg Oral q1800 [name], PHARMD   10 mg at 08/08/11 2111  . diltiazem (CARDIZEM) 100 mg in dextrose 5 % 100 mL infusion  5-15 mg/hr Intravenous Titrated Marykay Lex, MD 5 mL/hr at 08/09/11 0749 5 mg/hr at 08/09/11 0749  . diltiazem (CARDIZEM) tablet 60 mg  60 mg Oral Q8H Wilburt Finlay, [place]      . heparin ADULT infusion 100 units/mL (25000 units/250 mL)  1,700 Units/hr Intravenous Continuous Marykay Lex, MD 17 mL/hr at 08/09/11 0316 1,700 Units/hr at 08/09/11 0316  . heparin bolus via infusion 2,000 Units  2,000 Units Intravenous Once Hessie Diener [place], PHARMD   2,000 Units at 08/08/11 1720  . heparin bolus via infusion 2,500 Units  2,500 Units Intravenous Once Marykay Lex, MD   2,500 Units at 08/09/11 0255  . morphine 2 MG/ML injection 2 mg  2 mg Intravenous Q4H PRN  Marykay Lex, MD   2 mg at 08/06/11 2205  . nitroGLYCERIN (NITROSTAT) SL tablet 0.4 mg  0.4 mg Sublingual Q5 Min x 3 PRN Wilburt Finlay, PA   0.4 mg at 08/06/11 1150  . omega-3 acid ethyl esters (LOVAZA) capsule 1 g  1 g Oral BID Wilburt Finlay, PA   1 g at 08/08/11 2111  . ondansetron (ZOFRAN) injection 4 mg  4 mg Intravenous Q6H PRN Wilburt Finlay, PA      . STUDY MEDICATION 1 capsule  1 capsule Oral QAC breakfast [name], PHARMD   1 capsule at 08/09/11 0751  . STUDY MEDICATION 1 capsule  1 capsule Oral QAC breakfast Hessie Diener [place], PHARMD   1 capsule at 08/09/11 0751  . traMADol (ULTRAM) tablet 50 mg  50 mg Oral Q6H PRN Abelino Derrick, PA      . zolpidem [facility]) tablet 5 mg  5 mg Oral QHS PRN Abelino Derrick, [place]      . DISCONTD: heparin ADULT infusion 100 units/mL (25000 units/250 mL)  1,400 Units/hr Intravenous Continuous Marykay Lex, MD 14 mL/hr at 08/08/11 1510 1,400 Units/hr at 08/08/11 1510  . DISCONTD: simvastatin (ZOCOR) tablet 20 mg  20 mg Oral Daily Wilburt Finlay, PA   20 mg at 08/08/11 1010    PE: General appearance:  alert, cooperative and no distress Lungs: clear to auscultation bilaterally Heart: regular rate and rhythm, S1, S2 normal, no murmur, click, rub or gallop Abdomen: BS positive Extremities: No LEE Pulses: 2+ and symmetric Neuro:  Alert.  Not oriented to place, year, birthday.  CN II-XII intact.  PERRLA, EOMI On MD exam - moves all extremities; unable to verbalize what a telephone is (understands its purpose), knows my name, but unable to tell me where he is.    Lab Results:   Basename 08/09/11 1001 08/08/11 0511  WBC 5.4 4.5  HGB 11.4* 11.5*  HCT 35.6* 35.7*  PLT 154 139*   BMET  Basename 08/09/11 1001 08/07/11 0949  NA 137 138  K 4.0 3.6  CL 104 103  CO2 25 25  GLUCOSE 123* 114*  BUN 12 21  CREATININE 0.74 0.84  CALCIUM 8.9 8.6    Assessment/Plan  Principal Problem:  *Hypotension Active Problems:  CAD (coronary artery disease) PCI  to circumflex in 1997.  100% occluded RCA  Acute stroke - possibly  cardioembolic (08/09/2011)  Morbid obesity  HTN (hypertension)  DM type 2 (diabetes mellitus, type 2)  Diastolic dysfunction: Grade 2  Myelodysplastic syndrome  Dyslipidemia  S/P TURP Oct 2012  Volume overload - resolved.  Thrombocytopenia  Plan:  RN staff states pt is getting more confused.  Alert but not oriented.  Ordered CT Head.  Strength good and symmetric.  Maintaining nsr at 71bpm.  Switch to PO diltiazem.  PT working with pt.  Likely will need SNF.   LOS: 4 days    BRYAN HAGER, PA 08/09/2011; 8:26 AM  I have seen & examined Mr. Walthall this AM after returning from STAT Head CT demonstrating 3 cm acute non hemorrhagic left parietal MCA territory infarction without hemorrhage or significant mass effect that explains his expressive Aphasia.   Newly diagnosed Atril Fibrillation noted yesterday, was started on IV Diltiazem & IV Heparin.  -- makes cardio-embolic CVA most likely cause.  Will need Warfarin with heparin bridge.  Prior to this, was feeling stronger; Renal function now normal & BPs stable. BNP significantly improved.  Neurology has been consulted - await their recommendations. UCx negative - no source of infection noted.  As yet the etiology of his initial Sx leading to admission for hypotension have yet to be clinically determined.  No evidence of PNA, UTI & no GI sx. -- suspect viral syndrome, but cannot determine.  Marykay Lex, M.D., M.S. [facility] [address] [place], [place]  [address]  [phone] Pager # [phone]  08/09/2011 11:14 AM

## 2011-08-10 DIAGNOSIS — I6340 Cerebral infarction due to embolism of unspecified cerebral artery: Secondary | ICD-10-CM

## 2011-08-10 DIAGNOSIS — D649 Anemia, unspecified: Secondary | ICD-10-CM

## 2011-08-10 LAB — GLUCOSE, CAPILLARY
Glucose-Capillary: 101 mg/dL — ABNORMAL HIGH (ref 70–99)
Glucose-Capillary: 99 mg/dL (ref 70–99)

## 2011-08-10 LAB — HEPARIN LEVEL (UNFRACTIONATED): Heparin Unfractionated: 0.28 IU/mL — ABNORMAL LOW (ref 0.30–0.70)

## 2011-08-10 LAB — CBC
HCT: 35 % — ABNORMAL LOW (ref 39.0–52.0)
Hemoglobin: 11.2 g/dL — ABNORMAL LOW (ref 13.0–17.0)
MCH: 26.8 pg (ref 26.0–34.0)
MCHC: 32 g/dL (ref 30.0–36.0)
MCV: 83.7 fL (ref 78.0–100.0)
RBC: 4.18 MIL/uL — ABNORMAL LOW (ref 4.22–5.81)

## 2011-08-10 LAB — LIPID PANEL
Cholesterol: 100 mg/dL (ref 0–200)
Triglycerides: 104 mg/dL (ref <150)

## 2011-08-10 MED ORDER — WARFARIN SODIUM 7.5 MG PO TABS
7.5000 mg | ORAL_TABLET | Freq: Once | ORAL | Status: AC
  Administered 2011-08-10: 7.5 mg via ORAL
  Filled 2011-08-10: qty 1

## 2011-08-10 MED ORDER — WARFARIN - PHARMACIST DOSING INPATIENT
Freq: Every day | Status: DC
  Administered 2011-08-10: 18:00:00

## 2011-08-10 MED ORDER — ATENOLOL 12.5 MG HALF TABLET
12.5000 mg | ORAL_TABLET | Freq: Every day | ORAL | Status: DC
  Administered 2011-08-10 – 2011-08-14 (×5): 12.5 mg via ORAL
  Filled 2011-08-10 (×5): qty 1

## 2011-08-10 MED ORDER — COUMADIN BOOK
Freq: Once | Status: AC
  Administered 2011-08-10: 09:00:00
  Filled 2011-08-10: qty 1

## 2011-08-10 MED ORDER — WARFARIN VIDEO
Freq: Once | Status: AC
  Administered 2011-08-10: 09:00:00

## 2011-08-10 NOTE — Consult Note (Signed)
ANTICOAGULATION CONSULT NOTE - Initial Consult  Pharmacy Consult for Coumadin Indication: atrial fibrillation  No Known Allergies  Vital Signs: Temp: 98.1 F (36.7 C) (06/06 0735) Temp src: Oral (06/06 0735) BP: 119/60 mmHg (06/06 0735) Pulse Rate: 68  (06/06 0735)  Labs:  Basename 08/10/11 0518 08/09/11 1001 08/08/11 2356 08/08/11 0511 08/07/11 0949  HGB 11.2* 11.4* -- -- --  HCT 35.0* 35.6* -- 35.7* --  PLT 158 154 -- 139* --  APTT -- -- -- -- --  LABPROT -- -- -- -- --  INR -- -- -- -- --  HEPARINUNFRC 0.28* 0.47 0.11* -- --  CREATININE -- 0.74 -- -- 0.84  CKTOTAL -- -- -- -- --  CKMB -- -- -- -- --  TROPONINI -- -- -- -- --    Estimated Creatinine Clearance: 86.8 ml/min (by C-G formula based on Cr of 0.74).   Medical History: Past Medical History  Diagnosis Date  . Coronary artery disease   . Diabetes mellitus   . Hypertension    Assessment: 82yom started on heparin for new onset afib w/ RVR, developed an acute left MCA infarct (?cardioembolic) on 6/5, now heparin d/c'ed and plan to start coumadin with aspirin bridge. Coumadin score = 8, baseline INR 1.06, LFTs wnl. CBC has remained stable. Although patient has a high coumadin score mainly due to his weight, will start with a lower dose given his advanced age.  Goal of Therapy:  INR 2-3 Monitor platelets by anticoagulation protocol: Yes   Plan:  1) Coumadin 7.5mg  x 1 2) Daily INR 3) Coumadin education - book/video  [name] 08/10/2011,8:30 AM

## 2011-08-10 NOTE — Discharge Instructions (Signed)
STROKE/TIA DISCHARGE INSTRUCTIONS SMOKING Cigarette smoking nearly doubles your risk of having a stroke & is the single most alterable risk factor  If you smoke or have smoked in the last 12 months, you are advised to quit smoking for your health.  Most of the excess cardiovascular risk related to smoking disappears within a year of stopping.  Ask you doctor about anti-smoking medications  [place] Quit Line: 1-800-QUIT NOW  Free Smoking Cessation Classes [phone]  CHOLESTEROL Know your levels; limit fat & cholesterol in your diet  Lipid Panel     Component Value Date/Time   CHOL 100 08/10/2011 0518   TRIG 104 08/10/2011 0518   HDL 27* 08/10/2011 0518   CHOLHDL 3.7 08/10/2011 0518   VLDL 21 08/10/2011 0518   LDLCALC 52 08/10/2011 0518      Many patients benefit from treatment even if their cholesterol is at goal.  Goal: Total Cholesterol (CHOL) less than 160  Goal:  Triglycerides (TRIG) less than 150  Goal:  HDL greater than 40  Goal:  LDL (LDLCALC) less than 100   BLOOD PRESSURE American Stroke Association blood pressure target is less that 120/80 mm/Hg  Your discharge blood pressure is:  BP: 131/62 mmHg  Monitor your blood pressure  Limit your salt and alcohol intake  Many individuals will require more than one medication for high blood pressure  DIABETES (A1c is a blood sugar average for last 3 months) Goal HGBA1c is under 7% (HBGA1c is blood sugar average for last 3 months)  Diabetes: {STROKE DC DIABETES:22357}    Lab Results  Component Value Date   HGBA1C 5.9* 08/09/2011     Your HGBA1c can be lowered with medications, healthy diet, and exercise.  Check your blood sugar as directed by your physician  Call your physician if you experience unexplained or low blood sugars.  PHYSICAL ACTIVITY/REHABILITATION Goal is 30 minutes at least 4 days per week    {STROKE DC ACTIVITY/REHAB:22359}  Activity decreases your risk of heart attack and stroke and makes your heart stronger.   It helps control your weight and blood pressure; helps you relax and can improve your mood.  Participate in a regular exercise program.  Talk with your doctor about the best form of exercise for you (dancing, walking, swimming, cycling).  DIET/WEIGHT Goal is to maintain a healthy weight  Your discharge diet is: Cardiac *** liquids Your height is:  Height: 5\' 5"  (165.1 cm) Your current weight is: Weight: 115.9 kg (255 lb 8.2 oz) Your Body Mass Index (BMI) is:  BMI (Calculated): 42.1   Following the type of diet specifically designed for you will help prevent another stroke.  Your goal weight range is:  ***  Your goal Body Mass Index (BMI) is 19-24.  Healthy food habits can help reduce 3 risk factors for stroke:  High cholesterol, hypertension, and excess weight.  RESOURCES Stroke/Support Group:  Call [phone]  they meet the 3rd Sunday of the month on the Rehab Unit at [facility], [place] ( no meetings June, July & Aug).  STROKE EDUCATION PROVIDED/REVIEWED AND GIVEN TO PATIENT Stroke warning signs and symptoms How to activate emergency medical system (call 911). Medications prescribed at discharge. Need for follow-up after discharge. Personal risk factors for stroke. Pneumonia vaccine given:   {STROKE DC YES/NO/DATE:22363} Flu vaccine given:   {STROKE DC YES/NO/DATE:22363} My questions have been answered, the writing is legible, and I understand these instructions.  I will adhere to these goals & educational materials that have  been provided to me after my discharge from the hospital.

## 2011-08-10 NOTE — Progress Notes (Signed)
Clinical Social Worker spoke with patient's son, Windy Fast yesterday and he requested [facility]. CSW has left numerous voice messages for this facility to notify them of the referral. CSW will continue to follow and update patient and family when bed offer is received.   Rozetta Nunnery MSW, [employer] [phone]

## 2011-08-10 NOTE — Progress Notes (Signed)
Speech Language Pathology Treatment Patient Details Name: Terry Robertson MRN: [id number] DOB: December 18, 1928 Today's Date: 08/10/2011 Time: 9629-5284 SLP Time Calculation (min): 25 min  Assessment / Plan / Recommendation Clinical Impression  Patient seen for aphasia treatment. Patient to verbalize basic needs/wants and biographical information with moderate clinician cues including phonemic and reminders to utilize compensatory strategies including gestures, change in tone of voice, and different choice of words. Patient with intact awareness of verbal errors as noted by increased frustration during periods of dysfluency. Family friend present. Education complete regarding ways to help patient compensate for deficits. Both patient and visitor verbalized understanding. Will continue to f/u.     SLP Plan  Continue with current plan of care    Pertinent Vitals/Pain n/a  SLP Goals  SLP Goals Potential to Achieve Goals: Good SLP Goal #1: Pt. will express wants/thoughts for basic info in acute care with mod verbal/visual cues SLP Goal #1 - Progress: Progressing toward goal SLP Goal #2: Pt. will utilize strategies to relay intended message with mod verbal cues. SLP Goal #2 - Progress: Progressing toward goal SLP Goal #3: Pt. will demonstrate comprehension of short phrases with min verbal cues SLP Goal #3 - Progress: Progressing toward goal SLP Goal #4: Pt. will increase attempts to self correct verbal errors with mod verbal prompts. SLP Goal #4 - Progress: Progressing toward goal  General Behavior/Cognition: Alert;Cooperative;Pleasant mood Patient Positioning: Upright in chair      Treatment Treatment focused on: Aphasia Skilled Treatment: Patient seen for aphasia treatment. Patient to verbalize basic needs/wants and biographical information with moderate clinician cues including phonemic and reminders to utilize compensatory strategies including gestures, change in tone of voice, and different  choice of words. Patient with intact awareness of verbal errors as noted by increased frustration during periods of dysfluency. Family friend present. Education complete regarding ways to help patient compensate for deficits. Both patient and visitor verbalized understanding. Will continue to f/u.    [name] MA, CCC-SLP [phone]   [name] [name] 08/10/2011, 4:25 PM

## 2011-08-10 NOTE — Progress Notes (Signed)
Stroke Team Progress Note  HISTORY Terry Robertson is an 78 y.o. male who was admitted to the hospital 08/05/11 for 2 day history of weakness, night sweats and CP. On admission patient was hypotensive 67/40 labs showed elevated BUN and creatinine. 2 D echo showed EF 55-65% with normal wall motion and grade 2 diastolic dysfunction. On 08/07/2011 patient converted to A fib with RVR patient was given heparin bolus followed by heparin drip and diltiazem. 08/09/11 patient was noted to be more confused with speech difficulty. CT head obtained demonstrating 3 cm acute non hemorrhagic left parietal MCA territory infarction without hemorrhage or significant mass effect.  He was initially place on IV heparin, this was changed to PO coumadin with asa bridge yesterday. Patient was not a TPA candidate secondary to delay in arrival. He was admitted for further evaluation and treatment.  SUBJECTIVE No family is at the bedside.  Overall he feels his condition is stable. Patient reports difficulty speaking persists but confusion has cleared..  OBJECTIVE Most recent Vital Signs: Filed Vitals:   08/10/11 0400 08/10/11 0500 08/10/11 0735 08/10/11 0945  BP: 131/62  119/60   Pulse: 69  68 69  Temp: 97.9 F (36.6 C)  98.1 F (36.7 C)   TempSrc: Oral  Oral   Resp: 31  18 26   Height:      Weight:  115.9 kg (255 lb 8.2 oz)    SpO2: 96%  96% 97%   CBG (last 3)   Basename 08/10/11 0742 08/09/11 2128 08/09/11 1721  GLUCAP 101* 134* 108*   Intake/Output from previous day: 06/05 0701 - 06/06 0700 In: 1581 [P.O.:1080; I.V.:501] Out: 1250 [Urine:1250]  IV Fluid Intake:     . diltiazem (CARDIZEM) infusion 5 mg/hr (08/09/11 0749)  . DISCONTD: heparin Stopped (08/10/11 0815)   MEDICATIONS    . aspirin EC  81 mg Oral Daily  . atorvastatin  10 mg Oral q1800  . coumadin book   Does not apply Once  . diltiazem  60 mg Oral Q8H  . omega-3 acid ethyl esters  1 g Oral BID  . STUDY MEDICATION 1 capsule  1 capsule Oral QAC  breakfast  . STUDY MEDICATION 1 capsule  1 capsule Oral QAC breakfast  . warfarin  7.5 mg Oral ONCE-1800  . warfarin   Does not apply Once  . Warfarin - Pharmacist Dosing Inpatient   Does not apply q1800   PRN:  acetaminophen, ALPRAZolam, morphine injection, nitroGLYCERIN, ondansetron (ZOFRAN) IV, traMADol, zolpidem  Diet:  Cardiac thin liquids Activity:  Bedrest DVT Prophylaxis:  coumadin  CLINICALLY SIGNIFICANT STUDIES Basic Metabolic Panel:  Lab 08/09/11 1610 08/07/11 0949  NA 137 138  K 4.0 3.6  CL 104 103  CO2 25 25  GLUCOSE 123* 114*  BUN 12 21  CREATININE 0.74 0.84  CALCIUM 8.9 8.6  MG -- --  PHOS -- --   Liver Function Tests:  Lab 08/05/11 1327  AST 29  ALT 13  ALKPHOS 42  BILITOT 0.4  PROT 8.6*  ALBUMIN 2.9*   CBC:  Lab 08/10/11 0518 08/09/11 1001 08/05/11 1327  WBC 4.7 5.4 --  NEUTROABS -- -- 5.7  HGB 11.2* 11.4* --  HCT 35.0* 35.6* --  MCV 83.7 83.6 --  PLT 158 154 --   Coagulation:  Lab 08/06/11 0655 08/05/11 1327  LABPROT 14.0 15.4*  INR 1.06 1.19   Cardiac Enzymes:  Lab 08/06/11 0655 08/05/11 2323 08/05/11 1817  CKTOTAL 412* 517* 594*  CKMB 3.5 3.5 3.6  CKMBINDEX -- -- --  TROPONINI 0.54* <0.30 <0.30   Urinalysis:  Lab 08/06/11 0249  COLORURINE AMBER*  LABSPEC 1.020  PHURINE 5.0  GLUCOSEU NEGATIVE  HGBUR NEGATIVE  BILIRUBINUR NEGATIVE  KETONESUR NEGATIVE  PROTEINUR 30*  UROBILINOGEN 1.0  NITRITE NEGATIVE  LEUKOCYTESUR NEGATIVE   Lipid Panel    Component Value Date/Time   CHOL 100 08/10/2011 0518   TRIG 104 08/10/2011 0518   HDL 27* 08/10/2011 0518   CHOLHDL 3.7 08/10/2011 0518   VLDL 21 08/10/2011 0518   LDLCALC 52 08/10/2011 0518   HgbA1C  Lab Results  Component Value Date   HGBA1C 5.9* 08/09/2011   Urine Drug Screen:   No results found for this basename: labopia, cocainscrnur, labbenz, amphetmu, thcu, labbarb    Alcohol Level: No results found for this basename: ETH:2 in the last 168 hours  CT of the brain  08/09/2011   3 cm  acute non hemorrhagic left parietal MCA territory infarction without hemorrhage or significant mass effect.  Moderately advanced atrophy and chronic microvascular ischemic change.  MRI of the brain  08/10/2011   Acute moderate sized non hemorrhagic infarct extends from the posterior left opercular region into the posterior left frontal - parietal lobe.    MRA of the brain  08/10/2011 Intracranial atherosclerotic type changes as detailed above including decreased number of visualized left middle cerebral artery branch vessels consistent with the patient's acute infarct.    2D Echocardiogram  EF 55-60% with no source of embolus. Trivial, free flowing pericardial effusion  Carotid Doppler  No internal carotid artery stenosis bilaterally. Vertebrals with antegrade flow bilaterally.   CXR  1. Cardiac enlargement.  2. No heart failure.   EKG  normal sinus rhythm, occasional PVC noted, unifocal.   Therapy Recommendations PT -SNF, OT -SNF  Physical Exam  Pleasant middle aged male not in distress.Awake alert. Afebrile. Head is nontraumatic. Neck is supple without bruit. Hearing is normal. Cardiac exam no murmur or gallop. Lungs are clear to auscultation. Distal pulses are well felt.  Neurological Exam : Awake  Alert oriented x 3.Nonfluent speech and language with word hesitation and paraphasic errors. Good comprehension. Poor naming and repitition.[place] movements full without nystagmus. Face symmetric. Tongue midline. Normal strength, tone, reflexes and coordination. Normal sensation. Gait deferred.  ASSESSMENT [name] is a 78 y.o. male with a left frontal and parietal infarct secondary to atrial fibrillation. On aspirin 81 mg orally every day prior to admission. Now on aspirin 81 mg orally every day and warfarin for secondary stroke prevention. Patient with resultant expressive aphasia, dysarthria.  Hospital day # 5  TREATMENT/PLAN -Continue warfarin for secondary stroke prevention. -discontinue  aspirin once INR therapeutic -OT eval -OOB -ok to transfer to the floor from neuro standpoint  Terry Robertson, AVNP, ANP-BC, GNP-BC [facility] Pager: [phone] 08/10/2011 10:07 AM  Scribe for Dr. Delia Heady, Stroke Center Medical Director. He has personally reviewed chart, pertinent data, examined the patient and developed the plan of care. Pager:  [phone]

## 2011-08-10 NOTE — Clinical Social Work Placement (Unsigned)
     Clinical Social Work Department CLINICAL SOCIAL WORK PLACEMENT NOTE 08/10/2011  Patient:  Terry Robertson, Terry Robertson  Account Number:  [id number] Admit date:  08/05/2011  Clinical Social Worker:  Hulan Fray  Date/time:  08/10/2011 09:04 AM  Clinical Social Work is seeking post-discharge placement for this patient at the following level of care:   SKILLED NURSING   (*CSW will update this form in Epic as items are completed)   08/09/2011  Patient/family provided with [facility] Department of Clinical Social Works list of facilities offering this level of care within the geographic area requested by the patient (or if unable, by the patients family).  08/09/2011  Patient/family informed of their freedom to choose among providers that offer the needed level of care, that participate in Medicare, Medicaid or managed care program needed by the patient, have an available bed and are willing to accept the patient.  08/09/2011  Patient/family informed of [facility] ownership interest in [facility], as well as of the fact that they are under no obligation to receive care at this facility.  PASARR submitted to EDS on 08/09/2011 PASARR number received from EDS on   FL2 transmitted to all facilities in geographic area requested by pt/family on  08/09/2011 FL2 transmitted to all facilities within larger geographic area on   Patient informed that his/her managed care company has contracts with or will negotiate with  certain facilities, including the following:     Patient/family informed of bed offers received:   Patient chooses bed at  Physician recommends and patient chooses bed at    Patient to be transferred to  on   Patient to be transferred to facility by   The following physician request were entered in Epic:   Additional Comments:

## 2011-08-10 NOTE — Progress Notes (Signed)
Physical Therapy Treatment Patient Details Name: Terry Robertson MRN: [id number] DOB: 05-05-1928 Today's Date: 08/10/2011 Time: 1478-2956 PT Time Calculation (min): 23 min  PT Assessment / Plan / Recommendation Comments on Treatment Session  Pt with improved strength and balance compared to 6/4 session. Continues with communication issues that also carryover into ability to follow verbal commands as performing balance and gait activities. Does well with visual cues. Agree with SNF due to lack of 24/7 caregiver on d/c.    Follow Up Recommendations  Skilled nursing facility;Supervision/Assistance - 24 hour    Barriers to Discharge        Equipment Recommendations  None recommended by PT    Recommendations for Other Services    Frequency Min 3X/week   Plan Discharge plan remains appropriate;Frequency remains appropriate    Precautions / Restrictions Precautions Precautions: Fall   Pertinent Vitals/Pain Pt reported 0/10 pain at rest; 5/10 back pain with walking; repositioned at end of session SaO2 96% at rest 2L; 93% at rest on RA; 89% on RA walking; 96% on 2L at end of session    Mobility  Transfers Transfers: Sit to Stand;Stand to Sit Sit to Stand: 5: Supervision;With upper extremity assist;With armrests;From chair/3-in-1;Other (comment) (x2) Stand to Sit: 4: Min guard;With armrests;To chair/3-in-1;Other (comment) (x2) Details for Transfer Assistance: Pt with incr effort and moving slowly, cautiously (reports back ache, so may be partially due to pain); sits with decr control of descent, however does reach for armrests without cues Ambulation/Gait Ambulation/Gait Assistance: 4: Min guard Ambulation Distance (Feet): 20 Feet Assistive device: Straight cane (pt's cane adjusted down 4 inches to better fit him) Gait Pattern: Step-through pattern;Decreased stride length;Trunk flexed;Wide base of support (pt reports walks flexed forward for some time 2/2 back pain) Gait velocity:  Decreased General Gait Details: Pt declining further gait due to fatigue Modified Rankin (Stroke Patients Only) Pre-Morbid Rankin Score: No significant disability Modified Rankin: Moderately severe disability    Exercises General Exercises - Lower Extremity Ankle Circles/Pumps: AROM;15 reps;Seated Long Arc Quad: AROM;Both;10 reps;Seated Hip Flexion/Marching: AROM;10 reps;Both;Seated   PT Diagnosis:    PT Problem List:   PT Treatment Interventions:     PT Goals Acute Rehab PT Goals PT Goal: Sit to Stand - Progress: Partly met PT Goal: Stand to Sit - Progress: Progressing toward goal PT Goal: Ambulate - Progress: Progressing toward goal  Visit Information  Last PT Received On: 08/10/11 Assistance Needed: +1    Subjective Data  Subjective: Some sentences smooth; other times with word finding problems   Cognition  Overall Cognitive Status: Difficult to assess (due to expressive aphasia) Difficult to assess due to: Impaired communication Arousal/Alertness: Awake/alert Orientation Level: Appears intact for tasks assessed Behavior During Session: [facility] for tasks performed Following Commands: Follows one step commands inconsistently (requires visual cues)    Balance  Balance Balance Assessed: Yes Dynamic Sitting Balance Dynamic Sitting - Balance Support: Bilateral upper extremity supported;Feet supported Dynamic Sitting - Level of Assistance: 6: Modified independent (Device/Increase time) Static Standing Balance Static Standing - Balance Support: No upper extremity supported Static Standing - Level of Assistance: 4: Min assist Static Standing - Comment/# of Minutes: self-selects wide base of support; flexed/stooped posture Rhomberg - Eyes Opened: 10  (pt stepped feet apart due to felt unsteady) Dynamic Standing Balance Dynamic Standing - Balance Support: Right upper extremity supported;During functional activity Dynamic Standing - Level of Assistance: 4: Min assist Dynamic  Standing - Balance Activities: Forward lean/weight shifting;Reaching for objects Dynamic Standing - Comments: also marching  and attempted heel raises (pt unable due to imbalance/fear)  End of Session PT - End of Session Equipment Utilized During Treatment: Gait belt;Oxygen Activity Tolerance: Patient limited by fatigue Patient left: in chair;with call bell/phone within reach;with family/visitor present    Bruce Churilla 08/10/2011, 1:11 PM Pager [phone]

## 2011-08-10 NOTE — Progress Notes (Signed)
[facility]  Subjective: Doing well. No complaints.  Objective: Vital signs in last 24 hours: Temp:  [97.9 F (36.6 C)-98.5 F (36.9 C)] 98.1 F (36.7 C) (06/06 0735) Pulse Rate:  [52-96] 68  (06/06 0735) Resp:  [18-38] 18  (06/06 0735) BP: (119-150)/(58-67) 119/60 mmHg (06/06 0735) SpO2:  [91 %-96 %] 96 % (06/06 0735) Weight:  [115.9 kg (255 lb 8.2 oz)] 115.9 kg (255 lb 8.2 oz) (06/06 0500) Last BM Date: 08/05/11  Intake/Output from previous day: 06/05 0701 - 06/06 0700 In: 1581 [P.O.:1080; I.V.:501] Out: 1250 [Urine:1250] Intake/Output this shift:    Medications Current Facility-Administered Medications  Medication Dose Route Frequency Provider Last Rate Last Dose  . acetaminophen (TYLENOL) tablet 650 mg  650 mg Oral Q4H PRN Wilburt Finlay, PA      . ALPRAZolam Prudy Feeler) tablet 0.25 mg  0.25 mg Oral TID PRN Abelino Derrick, PA   0.25 mg at 08/06/11 2303  . aspirin EC tablet 81 mg  81 mg Oral Daily Kendra P Hiatt, PHARMD   81 mg at 08/09/11 1030  . atorvastatin (LIPITOR) tablet 10 mg  10 mg Oral q1800 [name], PHARMD   10 mg at 08/09/11 1734  . diltiazem (CARDIZEM) 100 mg in dextrose 5 % 100 mL infusion  5-15 mg/hr Intravenous Titrated Marykay Lex, MD 5 mL/hr at 08/09/11 0749 5 mg/hr at 08/09/11 0749  . diltiazem (CARDIZEM) tablet 60 mg  60 mg Oral Q8H Wilburt Finlay, PA   60 mg at 08/10/11 0555  . morphine 2 MG/ML injection 2 mg  2 mg Intravenous Q4H PRN Marykay Lex, MD   2 mg at 08/06/11 2205  . nitroGLYCERIN (NITROSTAT) SL tablet 0.4 mg  0.4 mg Sublingual Q5 Min x 3 PRN Wilburt Finlay, PA   0.4 mg at 08/06/11 1150  . omega-3 acid ethyl esters (LOVAZA) capsule 1 g  1 g Oral BID Wilburt Finlay, PA   1 g at 08/09/11 2144  . ondansetron (ZOFRAN) injection 4 mg  4 mg Intravenous Q6H PRN Wilburt Finlay, PA      . STUDY MEDICATION 1 capsule  1 capsule Oral QAC breakfast [name], PHARMD   1 capsule at 08/09/11 0751  . STUDY MEDICATION 1  capsule  1 capsule Oral QAC breakfast Hessie Diener [place], PHARMD   1 capsule at 08/09/11 0751  . traMADol (ULTRAM) tablet 50 mg  50 mg Oral Q6H PRN Abelino Derrick, PA      . zolpidem (AMBIEN) tablet 5 mg  5 mg Oral QHS PRN Abelino Derrick, PA      . DISCONTD: heparin ADULT infusion 100 units/mL (25000 units/250 mL)  1,700 Units/hr Intravenous Continuous Marykay Lex, MD 17 mL/hr at 08/09/11 0316 1,700 Units/hr at 08/09/11 0316    PE: General appearance: alert, cooperative and no distress Lungs: clear to auscultation bilaterally Heart: regular rate and rhythm, soft  sys MM Abdomen: +BS nontender, soft Extremities: No LEE Pulses: 2+ and symmetric Neurologic: Grossly normal, Having difficulty with finding words.  Strength appears equal.  Lab Results:   Basename 08/10/11 0518 08/09/11 1001 08/08/11 0511  WBC 4.7 5.4 4.5  HGB 11.2* 11.4* 11.5*  HCT 35.0* 35.6* 35.7*  PLT 158 154 139*   BMET  Basename 08/09/11 1001 08/07/11 0949  NA 137 138  K 4.0 3.6  CL 104 103  CO2 25 25  GLUCOSE 123* 114*  BUN 12 21  CREATININE 0.74 0.84  CALCIUM 8.9 8.6   PT/INR No results found for this basename: LABPROT:3,INR:3 in the last 72 hours Cholesterol  Basename 08/10/11 0518  CHOL 100   Lipid Panel     Component Value Date/Time   CHOL 100 08/10/2011 0518   TRIG 104 08/10/2011 0518   HDL 27* 08/10/2011 0518   CHOLHDL 3.7 08/10/2011 0518   VLDL 21 08/10/2011 0518   LDLCALC 52 08/10/2011 0518    Cardiac Enzymes No components found with this basename: TROPONIN:3, CKMB:3  Studies/Results: CT HEAD WITHOUT CONTRAST  Technique: Contiguous axial images were obtained from the base of  the skull through the vertex without contrast.  Comparison: None.  Findings: There is a moderate sized 3 cm hypodensity affecting the  left posterior parietal cortex and subcortical white matter without  hemorrhage or midline shift consistent with an acute left MCA  territory infarction. No CT signs of proximal Left  MCA vascular  occlusion.  Moderately advanced atrophy and chronic microvascular ischemic  change is noted. The calvarium is intact. Clear sinuses and  mastoids. Mild vascular calcification. Negative orbits.  IMPRESSION:  3 cm acute non hemorrhagic left parietal MCA territory infarction  without hemorrhage or significant mass effect.  Moderately advanced atrophy and chronic microvascular ischemic  change.  I discussed the findings with the ordering provider shortly after  completion of the study.    Assessment/Plan  Principal Problem:  *Hypotension Active Problems:  CAD (coronary artery disease) PCI to circumflex in 1997.  100% occluded RCA  Morbid obesity  Myelodysplastic syndrome  Dyslipidemia  HTN (hypertension)  Volume overload  S/P TURP Oct 2012  DM type 2 (diabetes mellitus, type 2)  Diastolic dysfunction: Grade 2  Thrombocytopenia  Acute stroke - possibly  cardioembolic (08/09/2011)  Plan:  MRI results pending.  Maintaining NSR in the 70's.  On PO diltiazem. DCd heparin per neuro recommendations and starting coumadin along with ASA.  Carotid dopplers pending.  He is ready to transfer to tele.   LOS: 5 days    HAGER, BRYAN 08/10/2011 7:46 AM   Patient seen and examined. Agree with assessment and plan. MRI still pending.  Maintaining NSR currently in 70's. BP well controlled.  Clinically compensated with CHF now. Appreciate neuro assessment.  Will transfer to telemetry. Resume low dose beta blocker  Lennette Bihari, MD, [facility] 08/10/2011 8:18 AM

## 2011-08-11 DIAGNOSIS — I6340 Cerebral infarction due to embolism of unspecified cerebral artery: Secondary | ICD-10-CM

## 2011-08-11 LAB — PROTIME-INR
INR: 1.01 (ref 0.00–1.49)
Prothrombin Time: 13.5 seconds (ref 11.6–15.2)

## 2011-08-11 LAB — CBC
Hemoglobin: 11.1 g/dL — ABNORMAL LOW (ref 13.0–17.0)
MCHC: 31.7 g/dL (ref 30.0–36.0)
WBC: 4.1 10*3/uL (ref 4.0–10.5)

## 2011-08-11 MED ORDER — WARFARIN SODIUM 7.5 MG PO TABS
7.5000 mg | ORAL_TABLET | Freq: Once | ORAL | Status: AC
  Administered 2011-08-11: 7.5 mg via ORAL
  Filled 2011-08-11: qty 1

## 2011-08-11 NOTE — Consult Note (Signed)
ANTICOAGULATION CONSULT NOTE - Initial Consult  Pharmacy Consult for Coumadin Indication: atrial fibrillation  No Known Allergies  Vital Signs: Temp: 97.8 F (36.6 C) (06/07 0615) Temp src: Oral (06/07 0615) BP: 150/67 mmHg (06/07 0615) Pulse Rate: 69  (06/07 0615)  Labs:  Basename 08/11/11 0538 08/10/11 0518 08/09/11 1001 08/08/11 2356  HGB 11.1* 11.2* -- --  HCT 35.0* 35.0* 35.6* --  PLT 170 158 154 --  APTT -- -- -- --  LABPROT 13.5 -- -- --  INR 1.01 -- -- --  HEPARINUNFRC -- 0.28* 0.47 0.11*  CREATININE -- -- 0.74 --  CKTOTAL -- -- -- --  CKMB -- -- -- --  TROPONINI -- -- -- --    Estimated Creatinine Clearance: 85.4 ml/min (by C-G formula based on Cr of 0.74).   Medical History: Past Medical History  Diagnosis Date  . Coronary artery disease   . Diabetes mellitus   . Hypertension    Assessment: 82yom started on heparin for new onset afib w/ RVR, developed an acute left MCA infarct (?cardioembolic) on 6/5, now heparin d/c'ed and plan to start coumadin with aspirin bridge. Coumadin score = 8, baseline INR 1.06, LFTs wnl. CBC has remained stable. Patient has a high coumadin score mainly due to his weight but will dose cautiously due to age  Goal of Therapy:  INR 2-3 Monitor platelets by anticoagulation protocol: Yes   Plan:  1) Coumadin 7.5mg  x 1 2) Daily INR 3) f/u coumadin education.  Denise Washburn [place] 08/11/2011,10:30 AM

## 2011-08-11 NOTE — Progress Notes (Signed)
Clinical Social Worker followed up with patient regarding bed offers received and plans for discharge. Patient's friend Ms. Pallis was at bedside. At this moment, patient does not know what his plan is. CSW gave the patient the responses from the facilities. Patient appeared favorable to going back home if possible and per Ms. Pallis he would have 24 hr supervision at home between her and children. CSW will still initiate Insurance authorization for SNF placement, just in case patient still needs higher level of care. Patient and friend are agreeable with this. CSW will continue to follow.   Rozetta Nunnery MSW, [employer] [phone]

## 2011-08-11 NOTE — Progress Notes (Addendum)
Speech Language Pathology Treatment Patient Details Name: Terry Robertson MRN: [id number] DOB: May 27, 1928 Today's Date: 08/11/2011 Time: 4098-1191 SLP Time Calculation (min): 32 min  Assessment / Plan / Recommendation    SLP Plan  Continue with current plan of care Please see treatment section below for clinical impression statement      SLP Goals  SLP Goals SLP Goal #1 - Progress: Progressing toward goal SLP Goal #2 - Progress: Progressing toward goal SLP Goal #3 - Progress: Progressing toward goal SLP Goal #4 - Progress: Progressing toward goal SLP Goal #5 - Progress: Progressing toward goal  General Respiratory Status: Supplemental O2 delivered via (comment) Behavior/Cognition: Alert;Cooperative;Pleasant mood Oral Cavity - Dentition: Adequate natural dentition Patient Positioning: Upright in chair  Oral Cavity - Oral Hygiene Does patient have any of the following "at risk" factors?: None of the above Brush patient's teeth BID with toothbrush (using toothpaste with fluoride): Yes   Treatment Treatment focused on: Aphasia Skilled Treatment: Pt. seen with family/friend present for language/aphasia facilitation.  Verbalization of wants/thoughts has improved from initial assessment.  He continues to exhibit mild-moderate dysfluency in conversation due to anomia.  Pt. required mild-mod verbal cues to use strategies when describing family and prior work experience to SLP (cues to use the alphabet, write down info were provided).   Emergent awareness of difficulty observed by stopping with attempts to self correct..  Pt. will benefit from continues ST services in acute care and recommend Speech therpay at discharge (home health, depending on discharge plans)   Royce Macadamia 08/11/2011, 3:05 PM

## 2011-08-11 NOTE — Progress Notes (Signed)
Physical Therapy Treatment Patient Details Name: Terry Robertson MRN: [id number] DOB: [birth year] Today's Date: 08/11/2011 Time: 6962-9528 PT Time Calculation (min): 19 min  PT Assessment / Plan / Recommendation Comments on Treatment Session  Pt continues to improve with mobility.  Most acute PT goals have been met and pt should be safe to D/C to next venue of care when cleared by MD.    Follow Up Recommendations  Skilled nursing facility;Supervision/Assistance - 24 hour    Barriers to Discharge        Equipment Recommendations  3 in 1 bedside comode    Recommendations for Other Services OT consult  Frequency Min 3X/week   Plan Discharge plan remains appropriate;Frequency remains appropriate    Precautions / Restrictions Precautions Precautions: Fall Restrictions Weight Bearing Restrictions: No   Pertinent Vitals/Pain Pt denies pain.  O2 sats at 90% on room air throughout session.     Mobility  Bed Mobility Bed Mobility: Supine to Sit;Sit to Supine Supine to Sit: 6: Modified independent (Device/Increase time);HOB flat;With rails Sit to Supine: 7: Independent;HOB flat Transfers Transfers: Sit to Stand;Stand to Sit Sit to Stand: 6: Modified independent (Device/Increase time);From bed;With upper extremity assist Stand to Sit: 7: Independent;To bed Details for Transfer Assistance: no cueing or assistance required.  Ambulation/Gait Ambulation/Gait Assistance: 5: Supervision Ambulation Distance (Feet): 180 Feet Assistive device: Straight cane Ambulation/Gait Assistance Details: repeated verbal cues for cervical posture as pt stares at the floor.  Gait Pattern: Step-through pattern;Decreased stride length;Trunk flexed;Wide base of support Gait velocity: Decreased Stairs: No Wheelchair Mobility Wheelchair Mobility: No Modified Rankin (Stroke Patients Only) Pre-Morbid Rankin Score: No significant disability Modified Rankin: No significant disability    Exercises     PT  Diagnosis:    PT Problem List:   PT Treatment Interventions:     PT Goals Acute Rehab PT Goals PT Goal Formulation: With patient Time For Goal Achievement: 08/22/11 Potential to Achieve Goals: Good Pt will go Supine/Side to Sit: with modified independence;with HOB 0 degrees PT Goal: Supine/Side to Sit - Progress: Met Pt will go Sit to Supine/Side: with modified independence;with HOB 0 degrees PT Goal: Sit to Supine/Side - Progress: Met Pt will go Sit to Stand: with supervision;with upper extremity assist PT Goal: Sit to Stand - Progress: Met Pt will go Stand to Sit: with supervision;with upper extremity assist PT Goal: Stand to Sit - Progress: Met Pt will Transfer Bed to Chair/Chair to Bed: with supervision PT Transfer Goal: Bed to Chair/Chair to Bed - Progress: Met Pt will Ambulate: with supervision;with least restrictive assistive device;51 - 150 feet PT Goal: Ambulate - Progress: Met Pt will Go Up / Down Stairs: 1-2 stairs;with supervision;with rail(s) PT Goal: Up/Down Stairs - Progress: Not met  Visit Information  Last PT Received On: 08/11/11    Subjective Data  Subjective: I feel good.  Patient Stated Goal: Go home to take care of himself   Cognition  Overall Cognitive Status: Impaired Area of Impairment: Following commands;Safety/judgement;Awareness of errors;Awareness of deficits;Problem solving Difficult to assess due to: Impaired communication Arousal/Alertness: Awake/alert Orientation Level: Appears intact for tasks assessed Behavior During Session: [employer] for tasks performed Following Commands: Follows one step commands inconsistently Safety/Judgement: Decreased awareness of safety precautions;Decreased safety judgement for tasks assessed Safety/Judgement - Other Comments: See ambulation/transfers Awareness of Errors: Assistance required to identify errors made    Balance  Balance Balance Assessed: Yes Static Sitting Balance Static Sitting - Balance Support: No  upper extremity supported;Feet supported Static Sitting - Level of Assistance:  7: Independent Static Sitting - Comment/# of Minutes: 3+ minutes sitting on EOB with no LOB.  Static Standing Balance Static Standing - Balance Support: Right upper extremity supported Static Standing - Level of Assistance: 6: Modified independent (Device/Increase time) Static Standing - Comment/# of Minutes: 2 min with no LOB  End of Session PT - End of Session Equipment Utilized During Treatment: Gait belt Activity Tolerance: Patient tolerated treatment well Patient left: in bed;with call bell/phone within reach;with family/visitor present Nurse Communication: Mobility status;Other (comment)    Terry Robertson 08/11/2011, 6:27 PM Dory Demont L. Saqib Cazarez DPT [id number]

## 2011-08-11 NOTE — Progress Notes (Signed)
Stroke Team Progress Note  HISTORY Terry Robertson is an 78 y.o. male who was admitted to the hospital 08/05/11 for 2 day history of weakness, night sweats and CP. On admission patient was hypotensive 67/40 labs showed elevated BUN and creatinine. 2 D echo showed EF 55-65% with normal wall motion and grade 2 diastolic dysfunction. On 08/07/2011 patient converted to A fib with RVR patient was given heparin bolus followed by heparin drip and diltiazem. 08/09/11 patient was noted to be more confused with speech difficulty. CT head obtained demonstrating 3 cm acute non hemorrhagic left parietal MCA territory infarction without hemorrhage or significant mass effect.  He was initially place on IV heparin, this was changed to PO coumadin with asa bridge yesterday. Patient was not a TPA candidate secondary to delay in arrival. He was admitted for further evaluation and treatment.  SUBJECTIVE Lady at bedside states patient will have 24/7 care at discharge.  OBJECTIVE Most recent Vital Signs: Filed Vitals:   08/10/11 2051 08/11/11 0122 08/11/11 0135 08/11/11 0615  BP: 155/99 138/76  150/67  Pulse: 93 67  69  Temp: 98.2 F (36.8 C) 97.3 F (36.3 C)  97.8 F (36.6 C)  TempSrc: Oral Oral  Oral  Resp: 20 34 24 24  Height:      Weight:    112.7 kg (248 lb 7.3 oz)  SpO2: 92% 92% 96% 97%   CBG (last 3)  Basename 08/10/11 1144 08/10/11 0742 08/09/11 2128  GLUCAP 99 101* 134*   Intake/Output from previous day: 06/06 0701 - 06/07 0700 In: 680 [P.O.:680] Out: 850 [Urine:850]  IV Fluid Intake:    MEDICATIONS    . aspirin EC  81 mg Oral Daily  . atenolol  12.5 mg Oral Daily  . atorvastatin  10 mg Oral q1800  . coumadin book   Does not apply Once  . diltiazem  60 mg Oral Q8H  . omega-3 acid ethyl esters  1 g Oral BID  . STUDY MEDICATION 1 capsule  1 capsule Oral QAC breakfast  . STUDY MEDICATION 1 capsule  1 capsule Oral QAC breakfast  . warfarin  7.5 mg Oral ONCE-1800  . warfarin   Does not apply Once    . Warfarin - Pharmacist Dosing Inpatient   Does not apply q1800   PRN:  acetaminophen, ALPRAZolam, morphine injection, nitroGLYCERIN, ondansetron (ZOFRAN) IV, traMADol, zolpidem  Diet:  Cardiac thin liquids Activity:  Up with assistance DVT Prophylaxis:  coumadin  CLINICALLY SIGNIFICANT STUDIES Basic Metabolic Panel:  Lab 08/09/11 1610 08/07/11 0949  NA 137 138  K 4.0 3.6  CL 104 103  CO2 25 25  GLUCOSE 123* 114*  BUN 12 21  CREATININE 0.74 0.84  CALCIUM 8.9 8.6  MG -- --  PHOS -- --   Liver Function Tests:  Lab 08/05/11 1327  AST 29  ALT 13  ALKPHOS 42  BILITOT 0.4  PROT 8.6*  ALBUMIN 2.9*   CBC:  Lab 08/11/11 0538 08/10/11 0518 08/05/11 1327  WBC 4.1 4.7 --  NEUTROABS -- -- 5.7  HGB 11.1* 11.2* --  HCT 35.0* 35.0* --  MCV 83.7 83.7 --  PLT 170 158 --   Coagulation:  Lab 08/11/11 0538 08/06/11 0655 08/05/11 1327  LABPROT 13.5 14.0 15.4*  INR 1.01 1.06 1.19   Cardiac Enzymes:  Lab 08/06/11 0655 08/05/11 2323 08/05/11 1817  CKTOTAL 412* 517* 594*  CKMB 3.5 3.5 3.6  CKMBINDEX -- -- --  TROPONINI 0.54* <0.30 <0.30   Urinalysis:  Lab 08/06/11 0249  COLORURINE AMBER*  LABSPEC 1.020  PHURINE 5.0  GLUCOSEU NEGATIVE  HGBUR NEGATIVE  BILIRUBINUR NEGATIVE  KETONESUR NEGATIVE  PROTEINUR 30*  UROBILINOGEN 1.0  NITRITE NEGATIVE  LEUKOCYTESUR NEGATIVE   Lipid Panel    Component Value Date/Time   CHOL 100 08/10/2011 0518   TRIG 104 08/10/2011 0518   HDL 27* 08/10/2011 0518   CHOLHDL 3.7 08/10/2011 0518   VLDL 21 08/10/2011 0518   LDLCALC 52 08/10/2011 0518   HgbA1C  Lab Results  Component Value Date   HGBA1C 5.9* 08/09/2011   Urine Drug Screen:   No results found for this basename: labopia,  cocainscrnur,  labbenz,  amphetmu,  thcu,  labbarb    Alcohol Level: No results found for this basename: ETH:2 in the last 168 hours  CT of the brain  08/09/2011   3 cm acute non hemorrhagic left parietal MCA territory infarction without hemorrhage or significant mass  effect.  Moderately advanced atrophy and chronic microvascular ischemic change.  MRI of the brain  08/10/2011   Acute moderate sized non hemorrhagic infarct extends from the posterior left opercular region into the posterior left frontal - parietal lobe.    MRA of the brain  08/10/2011 Intracranial atherosclerotic type changes as detailed above including decreased number of visualized left middle cerebral artery branch vessels consistent with the patient's acute infarct.    2D Echocardiogram  EF 55-60% with no source of embolus. Trivial, free flowing pericardial effusion  Carotid Doppler  No internal carotid artery stenosis bilaterally. Vertebrals with antegrade flow bilaterally.   CXR  1. Cardiac enlargement.  2. No heart failure.   EKG  normal sinus rhythm, occasional PVC noted, unifocal.   Therapy Recommendations PT -SNF, OT -SNF  Physical Exam  Pleasant middle aged male not in distress.Awake alert. Afebrile. Head is nontraumatic. Neck is supple without bruit. Hearing is normal. Cardiac exam no murmur or gallop. Lungs are clear to auscultation. Distal pulses are well felt.  Neurological Exam : Awake  Alert oriented x 3.Nonfluent speech and language with word hesitation and paraphasic errors. Good comprehension. Poor naming and repitition.[place] movements full without nystagmus. Face symmetric. Tongue midline. Normal strength, tone, reflexes and coordination. Normal sensation. Gait deferred.   ASSESSMENT Terry Robertson is a 78 y.o. male with a left frontal and parietal infarct secondary to atrial fibrillation. On aspirin 81 mg orally every day prior to admission. Now on aspirin 81 mg orally every day and warfarin for secondary stroke prevention. Patient with resultant expressive aphasia, dysarthria.  Hospital day # 6  TREATMENT/PLAN -Continue warfarin for secondary stroke prevention. -discontinue aspirin once INR therapeutic -ongoing risk factor control -ok for discharge from neuro  standpoint. SNF if family situation warrants. -Stroke Service will sign off. Follow up with Dr. Pearlean Brownie in 2 months.  Joaquin Music, ANP-BC, GNP-BC [facility] Pager: [phone] 08/11/2011 8:17 AM  Scribe for Dr. Delia Heady, Stroke Center Medical Director. He has personally reviewed chart, pertinent data, examined the patient and developed the plan of care. Pager:  [phone]

## 2011-08-11 NOTE — Progress Notes (Signed)
[facility]  Subjective: Doing much better today.  No complaints. Feeling stronger  Objective: Vital signs in last 24 hours: Temp:  [97.3 F (36.3 C)-98.2 F (36.8 C)] 97.8 F (36.6 C) (06/07 0615) Pulse Rate:  [66-93] 66  (06/07 1028) Resp:  [18-34] 18  (06/07 1028) BP: (133-155)/(65-99) 133/73 mmHg (06/07 1028) SpO2:  [89 %-98 %] 96 % (06/07 1028) Weight:  [112.7 kg (248 lb 7.3 oz)-114.3 kg (251 lb 15.8 oz)] 112.7 kg (248 lb 7.3 oz) (06/07 0615) Last BM Date: 08/10/11  Intake/Output from previous day: 06/06 0701 - 06/07 0700 In: 680 [P.O.:680] Out: 850 [Urine:850] Intake/Output this shift: Total I/O In: 450 [P.O.:450] Out: 75 [Urine:75]  Medications Current Facility-Administered Medications  Medication Dose Route Frequency Provider Last Rate Last Dose  . acetaminophen (TYLENOL) tablet 650 mg  650 mg Oral Q4H PRN Wilburt Finlay, PA      . ALPRAZolam Prudy Feeler) tablet 0.25 mg  0.25 mg Oral TID PRN Abelino Derrick, PA   0.25 mg at 08/06/11 2303  . aspirin EC tablet 81 mg  81 mg Oral Daily Kendra P Hiatt, PHARMD   81 mg at 08/11/11 1031  . atenolol (TENORMIN) tablet 12.5 mg  12.5 mg Oral Daily Wilburt Finlay, PA   12.5 mg at 08/11/11 1032  . atorvastatin (LIPITOR) tablet 10 mg  10 mg Oral q1800 [name], PHARMD   10 mg at 08/10/11 1656  . diltiazem (CARDIZEM) tablet 60 mg  60 mg Oral Q8H Wilburt Finlay, PA   60 mg at 08/11/11 0626  . morphine 2 MG/ML injection 2 mg  2 mg Intravenous Q4H PRN Marykay Lex, MD   2 mg at 08/06/11 2205  . nitroGLYCERIN (NITROSTAT) SL tablet 0.4 mg  0.4 mg Sublingual Q5 Min x 3 PRN Wilburt Finlay, PA   0.4 mg at 08/06/11 1150  . omega-3 acid ethyl esters (LOVAZA) capsule 1 g  1 g Oral BID Wilburt Finlay, PA   1 g at 08/11/11 1032  . ondansetron (ZOFRAN) injection 4 mg  4 mg Intravenous Q6H PRN Wilburt Finlay, PA      . STUDY MEDICATION 1 capsule  1 capsule Oral QAC breakfast [name], PHARMD   1 capsule at 08/11/11  2956  . STUDY MEDICATION 1 capsule  1 capsule Oral QAC breakfast Hessie Diener [place], PHARMD   1 capsule at 08/11/11 2130  . traMADol (ULTRAM) tablet 50 mg  50 mg Oral Q6H PRN Abelino Derrick, PA      . warfarin (COUMADIN) tablet 7.5 mg  7.5 mg Oral ONCE-1800 Hessie Diener [place], PHARMD   7.5 mg at 08/10/11 1656  . warfarin (COUMADIN) tablet 7.5 mg  7.5 mg Oral ONCE-1800 Marykay Lex, MD      . Warfarin - Pharmacist Dosing Inpatient   Does not apply q1800 [name], PHARMD      . zolpidem (AMBIEN) tablet 5 mg  5 mg Oral QHS PRN Abelino Derrick, PA        PE: General appearance: alert, cooperative, no distress and mildly obese Neck: no adenopathy, no carotid bruit, no JVD, supple, symmetrical, trachea midline and thyroid not enlarged, symmetric, no tenderness/mass/nodules Lungs: clear to auscultation bilaterally, normal percussion bilaterally and non-labored Heart: regular rate and rhythm, S1, S2 normal, no S3 or S4 and systolic murmur: systolic ejection 2/6, crescendo and decrescendo at 2nd right intercostal space Abdomen: soft, non-tender; bowel sounds normal; no masses,  no organomegaly  Extremities: extremities normal, atraumatic, no cyanosis or edema Pulses: 2+ and symmetric Neurologic: Mental status: Alert, oriented, thought content appropriate; now able to identify toothbrush & cell phone, walker, bed, glasses.   Lab Results:   Basename 08/11/11 0538 08/10/11 0518 08/09/11 1001  WBC 4.1 4.7 5.4  HGB 11.1* 11.2* 11.4*  HCT 35.0* 35.0* 35.6*  PLT 170 158 154   BMET  Basename 08/09/11 1001  NA 137  K 4.0  CL 104  CO2 25  GLUCOSE 123*  BUN 12  CREATININE 0.74  CALCIUM 8.9   PT/INR  Basename 08/11/11 0538  LABPROT 13.5  INR 1.01   Cholesterol  Basename 08/10/11 0518  CHOL 100    Studies/Results: MRI HEAD WITHOUT CONTRAST  MRA HEAD WITHOUT CONTRAST  Technique: Multiplanar, multiecho pulse sequences of the brain and surrounding structures were obtained  according to standard protocol without intravenous contrast. Angiographic images of the head were obtained using MRA technique without contrast.   Comparison: 08/09/2011 head CT. No comparison MR.    MRI HEAD  Findings: Acute moderate sized non hemorrhagic infarct extends  from the posterior left opercular region into the posterior left  frontal - parietal lobe. Tiny remote left cerebellar infarct. Remote small basal ganglia/thalamic infarcts.  Mild to slightly moderate small vessel disease type changes.  Mild global atrophy without hydrocephalus.  No intracranial mass lesion detected on this unenhanced exam.  Exophthalmos.  Cervical kyphosis with mild spinal stenosis and slight cord flattening C3-4 level.  Polypoid opacification inferior aspect right maxillary sinus.  IMPRESSION:  Acute moderate sized non hemorrhagic infarct extends from the posterior left opercular region into the posterior left frontal -  parietal lobe.   Please see above.  MRA HEAD  Findings: Artifact extends through the cavernous sinus. Mild narrowing supraclinoid aspect of the internal carotid artery  more notable on the right. Atypical branching pattern A1 segment left anterior cerebral artery.  Minimal irregularity proximal M1 segment left middle cerebral artery without high-grade stenosis. Decreased number of visualized left middle cerebral artery branch vessels consistent with the patient's acute infarct. Narrowing of visualized portions of the left middle cerebral artery branch vessels.   Mild irregularity of right middle cerebral artery branch vessels.  Right vertebral artery is dominant. Mild irregularity of the right PICA. Full extent of the left PICA not imaged. Mild irregularity of the basilar artery most notable mid to distal aspect. Poor delineation right AICA. Narrowing proximal left AICA.  Mild to moderate irregularity involving portions of the posterior cerebral arteries and superior cerebellar arteries  bilaterally.  No discrete aneurysm or vascular formation.  IMPRESSION:  Intracranial atherosclerotic type changes as detailed above including decreased number of visualized left middle cerebral  artery branch vessels consistent with the patient's acute infarct.  Assessment/Plan   Principal Problem:  *Hypotension Active Problems:  CAD (coronary artery disease) PCI to circumflex in 1997.  100% occluded RCA  Morbid obesity  Myelodysplastic syndrome  Dyslipidemia  HTN (hypertension)  Volume overload  S/P TURP Oct 2012  DM type 2 (diabetes mellitus, type 2)  Diastolic dysfunction: Grade 2  Thrombocytopenia  Acute stroke - possibly  cardioembolic (08/09/2011)  Plan:  He appears to be going in and out of Afib.  Rate is controlled.   BP  Stable.  Coumadin started.  INR subtherapeutic.  DC when therapeutic.    LOS: 6 days    HAGER, BRYAN 08/11/2011 11:20 AM  ATTENDING ATTESTATION:  I have seen and examined (my exam above) the patient along  with Wilburt Finlay, PA.  I have reviewed the chart, notes and new data.  I agree with Bryan's note.  Brief Description: 78 y/o man admitted for weakness & nausea -- unclear etiology, but was hypotensive & dehydrated on admission.  Recovered well with IVF & short term Dopamine.  Unfortunately - developed new Dx of Afib- RVR on 6/4 - started on IV diltiazem & IV heparin.  Despite rapid treatment with IV Heparin, he developed a R parietal CVA with Expressive Aphasia & dysarthria.   Neurology consulted - IV Heparin d/cd with plan to just use Warfarin. This AM, his aphasia has improved significantly with ability to vocalize phone, cell phone, etc.  Was working with PT & OT.   Key new complaints: None, just a bit weak.  Key new findings / data: INR slow to move  PLAN:  CVA - warfarin with ASA.  However with his known CAD, will continue ASA on d/c  PT/OT working with pt. - ? DISPO, family not "excited" about prospect of SNF rehab.  He may progress well  enough to be home with [facility] PT/OT.  Will monitor over the weekend.  On PO Diltiazem & Atenolol - rate mostly controlled, but is in& out of Afib according to monitor.  ? If Neurology still wants to hold IV Heparin bridge with intermittent Afib.  CAD - on statin & BB as well as ASA. Changed to Atorvastatin to avoid cross reactivity with diltiazem.  Will continue to monitor & PT/OT evaluate over the weekend while Warfarin dosing adjusted.  Marykay Lex, M.D., M.S. [facility] [address] [place], [place]  [address]  [phone]  08/11/2011 12:03 PM

## 2011-08-11 NOTE — Evaluation (Signed)
Occupational Therapy Evaluation Patient Details Name: Terry Robertson MRN: [id number] DOB: 1928/06/23 Today's Date: 08/11/2011 Time: 0865-7846 OT Time Calculation (min): 22 min  OT Assessment / Plan / Recommendation Clinical Impression  Pt. presents with hypotension, SOB, and CP. Findings included 100% occulded RCA. Now with findings of a CVA with primarily expressive difficulties and impaired motor planning. Pt. will benefit from skilled OT to increase functional independence to supervision level at D/C.    OT Assessment  Patient needs continued OT Services    Follow Up Recommendations  Home health OT;Supervision/Assistance - 24 hour;Skilled nursing facility    Barriers to Discharge None    Equipment Recommendations  3 in 1 bedside comode       Frequency  Min 2X/week    Precautions / Restrictions Precautions Precautions: Fall Restrictions Weight Bearing Restrictions: No       ADL  Eating/Feeding: Simulated;Set up Where Assessed - Eating/Feeding: Chair Grooming: Performed;Wash/dry hands;Wash/dry face;Teeth care;Minimal assistance Where Assessed - Grooming: Supported standing Upper Body Bathing: Simulated;Set up Where Assessed - Upper Body Bathing: Unsupported sitting Lower Body Bathing: Simulated;Minimal assistance Where Assessed - Lower Body Bathing: Unsupported sit to stand Upper Body Dressing: Minimal assistance;Performed (don gown) Where Assessed - Upper Body Dressing: Unsupported sitting Lower Body Dressing: Simulated;Moderate assistance Where Assessed - Lower Body Dressing: Unsupported sit to stand Toilet Transfer: Simulated;Minimal assistance Toilet Transfer Method: Sit to stand Toilet Transfer Equipment: Other (comment) Nurse, children's) Toileting - Clothing Manipulation and Hygiene: Simulated;Minimal assistance Where Assessed - Toileting Clothing Manipulation and Hygiene: Sit to stand from 3-in-1 or toilet Tub/Shower Transfer Method: Not assessed Equipment Used:  Cane Transfers/Ambulation Related to ADLs: Pt. min assist due to decreased balance and pt. inconsistent with placement of cane in which hand and kept switching while walking and then reaching for support from other objects in the room while walking around bed from sink-chair. ADL Comments: Pt. with decreased motor planning while completing brushing of teeth standing at the sink and pt. provided with min assist support due to posterior lean. Pt. able to identify objects with increased time and with increased time to process commands.     OT Diagnosis: Generalized weakness;Other (comment) (motor planning)  OT Problem List: Decreased strength;Decreased activity tolerance;Impaired balance (sitting and/or standing);Decreased safety awareness;Decreased knowledge of use of DME or AE OT Treatment Interventions: Self-care/ADL training;DME and/or AE instruction;Therapeutic activities;Patient/family education;Balance training;Cognitive remediation/compensation   OT Goals Acute Rehab OT Goals OT Goal Formulation: With patient Time For Goal Achievement: 08/25/11 Potential to Achieve Goals: Good ADL Goals Pt Will Perform Grooming: with set-up;with supervision;Standing at sink ADL Goal: Grooming - Progress: Goal set today Pt Will Perform Upper Body Dressing: with set-up;with supervision;Sitting, chair ADL Goal: Upper Body Dressing - Progress: Goal set today Pt Will Perform Lower Body Dressing: with set-up;with supervision;Sit to stand from chair ADL Goal: Lower Body Dressing - Progress: Goal set today Pt Will Transfer to Toilet: with supervision;Ambulation;with DME;3-in-1 ADL Goal: Toilet Transfer - Progress: Goal set today Additional ADL Goal #1: Pt. will demonstrate safe hand placement during ADL transfers without cues ADL Goal: Additional Goal #1 - Progress: Goal set today  Visit Information  Last OT Received On: 08/11/11 Assistance Needed: +1    Subjective Data  Subjective: "I can get up" Patient  Stated Goal: "I am going home...today"   Prior Functioning  Home Living Lives With: Alone;Son Available Help at Discharge: Family;Friend(s);Available PRN/intermittently Type of Home: House Home Access: Stairs to enter [employer] of Steps: 2 Entrance Stairs-Rails: Can reach both Home Layout:  One level Bathroom Shower/Tub: Engineer, manufacturing systems: Standard Home Adaptive Equipment: Straight cane;Shower chair without back Additional Comments: "friend" will be staying at home and will provide 24hr care Prior Function Level of Independence: Independent with assistive device(s) Able to Take Stairs?: Yes Driving: Yes Vocation: Retired Musician: Expressive difficulties Dominant Hand: Right    Cognition  Overall Cognitive Status: Impaired Area of Impairment: Following commands;Safety/judgement;Awareness of errors;Awareness of deficits;Problem solving Difficult to assess due to: Impaired communication Arousal/Alertness: Awake/alert Orientation Level: Appears intact for tasks assessed Behavior During Session: [facility] for tasks performed Following Commands: Follows one step commands inconsistently Safety/Judgement: Decreased awareness of safety precautions;Decreased safety judgement for tasks assessed Safety/Judgement - Other Comments: See ambulation/transfers Awareness of Errors: Assistance required to identify errors made Problem Solving: Use of hot water vs. cold and difficulty opening packag for toothpaste    Extremity/Trunk Assessment Right Upper Extremity Assessment RUE ROM/Strength/Tone: [facility] for tasks assessed (3+/5 Grossly) RUE Sensation: WFL - Light Touch RUE Coordination: Deficits RUE Coordination Deficits: Pt. unable to bring each fingertip to thumb for opposition and increased time required for fine motor tasks Left Upper Extremity Assessment LUE ROM/Strength/Tone: Within functional levels LUE Sensation: WFL - Light Touch LUE  Coordination: WFL - gross/fine motor   Mobility Bed Mobility Bed Mobility: Supine to Sit Supine to Sit: 4: Min assist;HOB flat;With rails Details for Bed Mobility Assistance: Min A for lifting trunk, increased time required Transfers Transfers: Sit to Stand;Stand to Sit Sit to Stand: 5: Supervision;With upper extremity assist;With armrests;From chair/3-in-1;Other (comment) Stand to Sit: 4: Min guard;With armrests;To chair/3-in-1;Other (comment) Details for Transfer Assistance: Mod verbal cues for safe hand placement and pt. mildly impulsive with getting up during mobility         End of Session OT - End of Session Equipment Utilized During Treatment: Gait belt Activity Tolerance: Patient tolerated treatment well Patient left: in chair;with call bell/phone within reach;with family/visitor present;Other (comment) (MD present) Nurse Communication: Mobility status   Rolonda Pontarelli, OTR/L Pager [phone] 08/11/2011, 2:34 PM

## 2011-08-12 LAB — PROTIME-INR
INR: 1.03 (ref 0.00–1.49)
Prothrombin Time: 13.7 seconds (ref 11.6–15.2)

## 2011-08-12 LAB — CBC
Hemoglobin: 11.3 g/dL — ABNORMAL LOW (ref 13.0–17.0)
MCHC: 31.5 g/dL (ref 30.0–36.0)
RDW: 14.2 % (ref 11.5–15.5)
WBC: 3.6 10*3/uL — ABNORMAL LOW (ref 4.0–10.5)

## 2011-08-12 LAB — CULTURE, BLOOD (ROUTINE X 2)
Culture  Setup Time: 201306020300
Culture  Setup Time: 201306020301
Culture: NO GROWTH

## 2011-08-12 MED ORDER — LISINOPRIL 10 MG PO TABS
10.0000 mg | ORAL_TABLET | Freq: Every day | ORAL | Status: DC
  Administered 2011-08-12 – 2011-08-14 (×3): 10 mg via ORAL
  Filled 2011-08-12 (×3): qty 1

## 2011-08-12 MED ORDER — LISINOPRIL 5 MG PO TABS: 5.0000 mg | ORAL_TABLET | Freq: Every day | ORAL | Status: DC

## 2011-08-12 MED ORDER — LISINOPRIL 20 MG PO TABS
20.0000 mg | ORAL_TABLET | Freq: Every day | ORAL | Status: DC
Start: 2011-08-12 — End: 2011-08-12
  Filled 2011-08-12: qty 1

## 2011-08-12 MED ORDER — WARFARIN SODIUM 10 MG PO TABS
10.0000 mg | ORAL_TABLET | Freq: Once | ORAL | Status: AC
  Administered 2011-08-12: 10 mg via ORAL
  Filled 2011-08-12: qty 1

## 2011-08-12 NOTE — Progress Notes (Signed)
ANTICOAGULATION CONSULT NOTE - Follow Up Consult  Pharmacy Consult for Coumadin Indication: atrial fibrillation  No Known Allergies  Patient Measurements: Height: 5\' 5"  (165.1 cm) Weight: 246 lb 4.1 oz (111.7 kg) IBW/kg (Calculated) : 61.5   Vital Signs: Temp: 97.9 F (36.6 C) (06/08 0607) Temp src: Oral (06/08 0607) BP: 156/70 mmHg (06/08 0607) Pulse Rate: 69  (06/08 0607)  Labs:  Basename 08/12/11 0605 08/11/11 0538 08/10/11 0518 08/09/11 1001  HGB 11.3* 11.1* -- --  HCT 35.9* 35.0* 35.0* --  PLT 169 170 158 --  APTT -- -- -- --  LABPROT 13.7 13.5 -- --  INR 1.03 1.01 -- --  HEPARINUNFRC -- -- 0.28* 0.47  CREATININE -- -- -- 0.74  CKTOTAL -- -- -- --  CKMB -- -- -- --  TROPONINI -- -- -- --    Estimated Creatinine Clearance: 85 ml/min (by C-G formula based on Cr of 0.74).  Medications:  Scheduled:    . aspirin EC  81 mg Oral Daily  . atenolol  12.5 mg Oral Daily  . atorvastatin  10 mg Oral q1800  . diltiazem  60 mg Oral Q8H  . omega-3 acid ethyl esters  1 g Oral BID  . STUDY MEDICATION 1 capsule  1 capsule Oral QAC breakfast  . STUDY MEDICATION 1 capsule  1 capsule Oral QAC breakfast  . warfarin  7.5 mg Oral ONCE-1800  . Warfarin - Pharmacist Dosing Inpatient   Does not apply q1800   Infusions:   PRN: acetaminophen, ALPRAZolam, morphine injection, nitroGLYCERIN, ondansetron (ZOFRAN) IV, traMADol, zolpidem  Assessment: 82 yom with new onset afib w RVR off heparin d/t acute left MCA infarct and on coumadin with plan asa bridge. INR subtherapeutic despite to loading doses of coumadin. Pt has a high coumadin score mainly d/t his weight but will need to dose cautiously d/t his age. CBC stable and no bleeding noted in documentation.   Goal of Therapy:  INR 2-3 Monitor platelets by anticoagulation protocol: Yes   Plan:  1. Coumadin 10mg  po x 1 dose today at 1800 2. F/u PT/INR tomorrow AM  Thank you,  Brett Fairy, PharmD Pager: [phone]  08/12/2011 9:08  AM

## 2011-08-12 NOTE — Discharge Planning (Signed)
CSW met with patient to discuss discharge planning. Patient reports physician advised he may be ready for discharge Tuesday/Wednesday with Home Health as he is doing remarkably better and SNF is not necessary at this time.  Weekday CSW requested copy of other insurance patient had and covering CSW was able to get copy.  No other needs expressed at this time.  CSW will remain available as needed.  Manson Passey Tenia Goh ANN S , MSW, LCSWA 08/12/2011 12:08 PM [phone]

## 2011-08-12 NOTE — Progress Notes (Addendum)
[facility]  Subjective: Feels good  Objective: Vital signs in last 24 hours: Temp:  [97.8 F (36.6 C)-97.9 F (36.6 C)] 97.9 F (36.6 C) (06/08 0607) Pulse Rate:  [56-72] 72  (06/08 1011) Resp:  [20-28] 20  (06/08 0607) BP: (148-158)/(70-77) 158/76 mmHg (06/08 1011) SpO2:  [95 %] 95 % (06/08 0607) Weight:  [111.7 kg (246 lb 4.1 oz)] 111.7 kg (246 lb 4.1 oz) (06/08 0607) Last BM Date: 08/11/11  Intake/Output from previous day: 06/07 0701 - 06/08 0700 In: 930 [P.O.:930] Out: 650 [Urine:650] Intake/Output this shift: Total I/O In: 240 [P.O.:240] Out: -   Medications Current Facility-Administered Medications  Medication Dose Route Frequency Provider Last Rate Last Dose  . acetaminophen (TYLENOL) tablet 650 mg  650 mg Oral Q4H PRN Terry Finlay, PA      . ALPRAZolam Prudy Feeler) tablet 0.25 mg  0.25 mg Oral TID PRN Terry Derrick, PA   0.25 mg at 08/06/11 2303  . aspirin EC tablet 81 mg  81 mg Oral Daily Terry Terry Robertson, PHARMD   81 mg at 08/12/11 1011  . atenolol (TENORMIN) tablet 12.5 mg  12.5 mg Oral Daily Terry Finlay, PA   12.5 mg at 08/12/11 1011  . atorvastatin (LIPITOR) tablet 10 mg  10 mg Oral q1800 Terry Terry Robertson, PHARMD   10 mg at 08/11/11 1740  . diltiazem (CARDIZEM) tablet 60 mg  60 mg Oral Q8H Terry Hager, PA   60 mg at 08/12/11 0700  . morphine 2 MG/ML injection 2 mg  2 mg Intravenous Q4H PRN Terry Lex, MD   2 mg at 08/06/11 2205  . nitroGLYCERIN (NITROSTAT) SL tablet 0.4 mg  0.4 mg Sublingual Q5 Min x 3 PRN Terry Finlay, PA   0.4 mg at 08/06/11 1150  . omega-3 acid ethyl esters (LOVAZA) capsule 1 g  1 g Oral BID Terry Finlay, PA   1 g at 08/12/11 1011  . ondansetron (ZOFRAN) injection 4 mg  4 mg Intravenous Q6H PRN Terry Finlay, PA      . STUDY MEDICATION 1 capsule  1 capsule Oral QAC breakfast Terry Terry Robertson, PHARMD   1 capsule at 08/12/11 0800  . STUDY MEDICATION 1 capsule  1 capsule Oral QAC breakfast Terry Terry Robertson,  PHARMD   1 capsule at 08/12/11 0800  . traMADol (ULTRAM) tablet 50 mg  50 mg Oral Q6H PRN Terry Derrick, PA      . warfarin (COUMADIN) tablet 10 mg  10 mg Oral ONCE-1800 Terry Terry Robertson, PHARMD      . warfarin (COUMADIN) tablet 7.5 mg  7.5 mg Oral ONCE-1800 Terry Lex, MD   7.5 mg at 08/11/11 1740  . Warfarin - Pharmacist Dosing Inpatient   Does not apply q1800 Terry Terry Robertson, PHARMD      . zolpidem Terry Terry Robertson) tablet 5 mg  5 mg Oral QHS PRN Terry Derrick, PA        PE: Terry Terry Robertson in 66s. No furhter Afib. General appearance: alert, cooperative and no distress Lungs: clear to auscultation bilaterally Heart: regular rate and rhythm, S1, S2 normal, no murmur, click, rub or gallop Extremities: No LEE Pulses: 2+ and symmetric Skin: Warm and Dry  Lab Results:   Basename 08/12/11 0605 08/11/11 0538 08/10/11 0518  WBC 3.6* 4.1 4.7  HGB 11.3* 11.1* 11.2*  HCT 35.9* 35.0* 35.0*  PLT 169 170 158   BMET No results found for this basename: NA:3,K:3,CL:3,CO2:3,GLUCOSE:3,BUN:3,CREATININE:3,CALCIUM:3 in the  last 72 hours PT/INR  Basename 08/12/11 0605 08/11/11 0538  LABPROT 13.7 13.5  INR 1.03 1.01   Cholesterol  Basename 08/10/11 0518  CHOL 100     Assessment/Plan    Principal Problem:  *Hypotension Active Problems:  Atrial fibrillation  CAD (coronary artery disease) PCI to circumflex in 1997.  100% occluded RCA  Morbid obesity  Myelodysplastic syndrome  Dyslipidemia  HTN (hypertension)  Volume overload  S/P TURP Oct 2012  DM type 2 (diabetes mellitus, type 2)  Diastolic dysfunction: Grade 2  Thrombocytopenia  Acute stroke - possibly  cardioembolic (08/09/2011)  Plan:  Pt has had some improvement neurologically.  ST/PT/OT following.  Asa and coumadin.  Subtherapeutic INR.  Pt prefers to go home where he will have 24 hour supervision.   Maintaining Sinus brady.  59.  Restarting lisinopril at 10mg . Home dose 40.    LOS: 7 days    Terry Robertson, Terry 08/12/2011 11:09 AM  I  have seen & examined Terry Terry Robertson this AM.  He continues to progress well.  I agree with the findings & assessment / plan as noted by Terry Terry Robertson above.  I am not sure that we will ever figure out the etiology of his presenting Sx & hypotension.  Best guess is that he had a viral syndrome --> leading to dehydration that,in the setting of taking PO BP meds led to hypotension.     BP now back up to stable range -- Will increased BP medications - not restarting Amlodipine.  HR stable - no further Afib.  CVA -- Sxs are improving.  PT/OT following - may do fine with HH PT/OT on discharge  INR still yet to move.  Would not feel comfortable discharging at current INR level.  Dosing adjusted by Pharmacist increasing dose.  Anticipate INR starting to rise in the next day or so.      Hope for d/c early next week pending INR responsiveness.    Terry Terry Robertson, M.D., M.S. [facility] [address] [place], [place]  [address]  [phone] Pager # [phone]  08/12/2011 11:34 AM

## 2011-08-13 LAB — CBC
Hemoglobin: 11.3 g/dL — ABNORMAL LOW (ref 13.0–17.0)
MCHC: 31.5 g/dL (ref 30.0–36.0)
RDW: 14.3 % (ref 11.5–15.5)
WBC: 3 10*3/uL — ABNORMAL LOW (ref 4.0–10.5)

## 2011-08-13 LAB — PROTIME-INR
INR: 1.1 (ref 0.00–1.49)
Prothrombin Time: 14.4 seconds (ref 11.6–15.2)

## 2011-08-13 MED ORDER — DILTIAZEM HCL ER COATED BEADS 180 MG PO CP24
180.0000 mg | ORAL_CAPSULE | Freq: Every day | ORAL | Status: DC
  Administered 2011-08-14: 180 mg via ORAL
  Filled 2011-08-13: qty 1

## 2011-08-13 MED ORDER — WARFARIN SODIUM 6 MG PO TABS
12.0000 mg | ORAL_TABLET | Freq: Once | ORAL | Status: AC
  Administered 2011-08-13: 12 mg via ORAL
  Filled 2011-08-13: qty 2

## 2011-08-13 NOTE — Progress Notes (Signed)
[facility]  Subjective: Feels good  Objective: Vital signs in last 24 hours: Temp:  [97.7 F (36.5 C)-98.5 F (36.9 C)] 97.7 F (36.5 C) (06/09 0519) Pulse Rate:  [60-72] 67  (06/09 0519) Resp:  [20-26] 20  (06/09 0519) BP: (148-158)/(66-76) 151/75 mmHg (06/09 0519) SpO2:  [96 %-99 %] 99 % (06/09 0519) Weight:  [111.902 kg (246 lb 11.2 oz)] 111.902 kg (246 lb 11.2 oz) (06/09 0519) Last BM Date: 08/11/11  Intake/Output from previous day: 06/08 0701 - 06/09 0700 In: 840 [P.O.:840] Out: 1050 [Urine:1050] Intake/Output this shift: Total I/O In: -  Out: 250 [Urine:250]  Medications Current Facility-Administered Medications  Medication Dose Route Frequency Provider Last Rate Last Dose  . acetaminophen (TYLENOL) tablet 650 mg  650 mg Oral Q4H PRN Wilburt Finlay, PA      . ALPRAZolam Prudy Feeler) tablet 0.25 mg  0.25 mg Oral TID PRN Abelino Derrick, PA   0.25 mg at 08/06/11 2303  . aspirin EC tablet 81 mg  81 mg Oral Daily Kendra P Hiatt, PHARMD   81 mg at 08/12/11 1011  . atenolol (TENORMIN) tablet 12.5 mg  12.5 mg Oral Daily Wilburt Finlay, PA   12.5 mg at 08/12/11 1011  . atorvastatin (LIPITOR) tablet 10 mg  10 mg Oral q1800 [name], PHARMD   10 mg at 08/12/11 1727  . diltiazem (CARDIZEM CD) 24 hr capsule 180 mg  180 mg Oral Daily Marykay Lex, MD      . diltiazem (CARDIZEM) tablet 60 mg  60 mg Oral Q8H Marykay Lex, MD   60 mg at 08/13/11 0641  . lisinopril (PRINIVIL,ZESTRIL) tablet 10 mg  10 mg Oral Daily Marykay Lex, MD   10 mg at 08/12/11 1415  . morphine 2 MG/ML injection 2 mg  2 mg Intravenous Q4H PRN Marykay Lex, MD   2 mg at 08/06/11 2205  . nitroGLYCERIN (NITROSTAT) SL tablet 0.4 mg  0.4 mg Sublingual Q5 Min x 3 PRN Wilburt Finlay, PA   0.4 mg at 08/06/11 1150  . omega-3 acid ethyl esters (LOVAZA) capsule 1 g  1 g Oral BID Wilburt Finlay, PA   1 g at 08/12/11 2219  . ondansetron (ZOFRAN) injection 4 mg  4 mg Intravenous Q6H PRN  Wilburt Finlay, PA      . STUDY MEDICATION 1 capsule  1 capsule Oral QAC breakfast [name], PHARMD   1 capsule at 08/13/11 0914  . STUDY MEDICATION 1 capsule  1 capsule Oral QAC breakfast Hessie Diener [place], PHARMD   1 capsule at 08/13/11 0914  . traMADol (ULTRAM) tablet 50 mg  50 mg Oral Q6H PRN Abelino Derrick, PA      . warfarin (COUMADIN) tablet 10 mg  10 mg Oral ONCE-1800 Brett Fairy, PHARMD   10 mg at 08/12/11 1727  . Warfarin - Pharmacist Dosing Inpatient   Does not apply q1800 [name], PHARMD      . zolpidem [facility]) tablet 5 mg  5 mg Oral QHS PRN Abelino Derrick, PA      . DISCONTD: lisinopril (PRINIVIL,ZESTRIL) tablet 20 mg  20 mg Oral Daily Wilburt Finlay, [place]      . DISCONTD: lisinopril (PRINIVIL,ZESTRIL) tablet 5 mg  5 mg Oral Daily Marykay Lex, MD        PE: Tele - intermittent SBrady in 21s; currently SR in 80s.  No furhter Afib. General appearance: alert, cooperative and no  distress Lungs: clear to auscultation bilaterally Heart: regular rate and rhythm, S1, S2 normal, no murmur, click, rub or gallop Extremities: No LEE Pulses: 2+ and symmetric Skin: Warm and Dry. Neurologically - (Aphasia improving) notably increased recall, object identification & reasoning; still has some mild dysarthria & aphasia  Lab Results:   Basename 08/13/11 0630 08/12/11 0605 08/11/11 0538  WBC 3.0* 3.6* 4.1  HGB 11.3* 11.3* 11.1*  HCT 35.9* 35.9* 35.0*  PLT 190 169 170   BMET No results found for this basename: NA:3,K:3,CL:3,CO2:3,GLUCOSE:3,BUN:3,CREATININE:3,CALCIUM:3 in the last 72 hours PT/INR  Basename 08/13/11 0630 08/12/11 0605 08/11/11 0538  LABPROT 14.4 13.7 13.5  INR 1.10 1.03 1.01   Cholesterol No results found for this basename: CHOL in the last 72 hours   Assessment/Plan    Principal Problem:  *Acute stroke - possibly  cardioembolic (08/09/2011) Active Problems:  Hypotension - resolved; due to poor PO intake with continued Antihypertensive therapy   CAD (coronary artery disease) PCI to circumflex in 1997.  100% occluded RCA  Atrial fibrillation  Morbid obesity  HTN (hypertension)  DM type 2 (diabetes mellitus, type 2)  Diastolic dysfunction: Grade 2  Thrombocytopenia  Myelodysplastic syndrome  Dyslipidemia  S/P TURP Oct 2012   LOS: 8 days   I have seen & examined Terry Robertson this AM.  He continues to progress well.  I agree with the findings & assessment / plan as noted by [name] above.  I am not sure that we will ever figure out the etiology of his presenting Sx & hypotension.  Best guess is that he had a viral syndrome --> leading to dehydration that,in the setting of taking PO BP meds led to hypotension.     BP now back up to stable range --  Added back ACE-I yesterday, not restarting Amlodipine.  Will allow for mild permissive hypertension in the post CVA period. Anticipate increasing ACE-I to 20mg  po daily by d/c if BP remains at current level.    HR stable - no further Afib.  CVA -- Sxs are improving.  PT/OT following - may do fine with [facility] PT/OT on discharge.  On ASA & Warfarin  Will continue ASA 81 mg for CAD prophylaxis.  INR just now starting to rise.  Would not feel comfortable discharging at current INR level.  Dosing adjusted by Pharmacist increasing dose.  Anticipate INR starting to rise in the next day or so -- will need close INR f/u.                                                          Planning for d/c early next week pending INR responsiveness.    Pt prefers to go home where he will have 24 hour supervision.    Marykay Lex, M.D., M.S. [facility] [address] [place], [place]  [address]  [phone] Pager # [phone]  08/13/2011 9:17 AM

## 2011-08-13 NOTE — Progress Notes (Signed)
ANTICOAGULATION CONSULT NOTE - Follow Up Consult  Pharmacy Consult for Coumadin Indication: atrial fibrillation  No Known Allergies  Patient Measurements: Height: 5\' 5"  (165.1 cm) Weight: 246 lb 11.2 oz (111.902 kg) (scale c) IBW/kg (Calculated) : 61.5   Vital Signs: Temp: 97.7 F (36.5 C) (06/09 0519) Temp src: Oral (06/09 0519) BP: 151/75 mmHg (06/09 0519) Pulse Rate: 67  (06/09 0519)  Labs:  Basename 08/13/11 0630 08/12/11 0605 08/11/11 0538  HGB 11.3* 11.3* --  HCT 35.9* 35.9* 35.0*  PLT 190 169 170  APTT -- -- --  LABPROT 14.4 13.7 13.5  INR 1.10 1.03 1.01  HEPARINUNFRC -- -- --  CREATININE -- -- --  CKTOTAL -- -- --  CKMB -- -- --  TROPONINI -- -- --    Estimated Creatinine Clearance: 85.1 ml/min (by C-G formula based on Cr of 0.74).  Medications:  Scheduled:     . aspirin EC  81 mg Oral Daily  . atenolol  12.5 mg Oral Daily  . atorvastatin  10 mg Oral q1800  . diltiazem  180 mg Oral Daily  . diltiazem  60 mg Oral Q8H  . lisinopril  10 mg Oral Daily  . omega-3 acid ethyl esters  1 g Oral BID  . STUDY MEDICATION 1 capsule  1 capsule Oral QAC breakfast  . STUDY MEDICATION 1 capsule  1 capsule Oral QAC breakfast  . warfarin  10 mg Oral ONCE-1800  . Warfarin - Pharmacist Dosing Inpatient   Does not apply q1800  . DISCONTD: lisinopril  20 mg Oral Daily  . DISCONTD: lisinopril  5 mg Oral Daily   Infusions:   PRN: acetaminophen, ALPRAZolam, morphine injection, nitroGLYCERIN, ondansetron (ZOFRAN) IV, traMADol, zolpidem  Assessment: 82 yom with new onset afib w RVR off heparin d/t acute left MCA infarct and on coumadin with plan asa bridge. Pt has received 3 days of coumadin and pharmacy has been dosing aggressively d/t his weight. Expect INR to start to increase within the next day or so. Pt has a high coumadin score mainly d/t his weight. CBC stable and no bleeding noted in documentation.  Goal of Therapy:  INR 2-3 Monitor platelets by anticoagulation  protocol: Yes   Plan:  1. Coumadin 12 mg po x 1 dose today at 1800 2. F/u PT/INR tomorrow AM  Thank you,  Brett Fairy, PharmD Pager: [phone]  08/13/2011 9:25 AM

## 2011-08-13 NOTE — Plan of Care (Signed)
Problem: Phase III Progression Outcomes Goal: Dyspnea controlled with activity Outcome: Completed/Met Date Met:  08/13/11 Pt ambulate on RA this am w/o problem

## 2011-08-13 NOTE — Progress Notes (Signed)
Pt's HR57.  Asymptomatic.  Terry Robertson, [place] informed.  No new order given.  Will continue to monitor.  Amanda Pea, Charity fundraiser.

## 2011-08-14 LAB — PROTIME-INR
INR: 1.19 (ref 0.00–1.49)
Prothrombin Time: 15.4 seconds — ABNORMAL HIGH (ref 11.6–15.2)

## 2011-08-14 LAB — CBC
MCHC: 30.8 g/dL (ref 30.0–36.0)
Platelets: 203 10*3/uL (ref 150–400)
RDW: 14.2 % (ref 11.5–15.5)

## 2011-08-14 MED ORDER — DILTIAZEM HCL ER COATED BEADS 180 MG PO CP24: 180.0000 mg | ORAL_CAPSULE | Freq: Every day | ORAL | Status: DC

## 2011-08-14 MED ORDER — WARFARIN SODIUM 2.5 MG PO TABS
12.5000 mg | ORAL_TABLET | Freq: Once | ORAL | Status: AC
  Administered 2011-08-14: 12.5 mg via ORAL
  Filled 2011-08-14: qty 1

## 2011-08-14 MED ORDER — NITROGLYCERIN 0.4 MG SL SUBL: 0.4000 mg | SUBLINGUAL_TABLET | SUBLINGUAL | Status: DC | PRN

## 2011-08-14 MED ORDER — LISINOPRIL 10 MG PO TABS: 10.0000 mg | ORAL_TABLET | Freq: Every day | ORAL | Status: DC

## 2011-08-14 MED ORDER — WARFARIN SODIUM 2.5 MG PO TABS: 7.5000 mg | ORAL_TABLET | Freq: Once | ORAL | Status: DC

## 2011-08-14 MED ORDER — ATENOLOL 12.5 MG HALF TABLET: 12.5000 mg | ORAL_TABLET | Freq: Every day | ORAL | Status: DC

## 2011-08-14 NOTE — Progress Notes (Signed)
PT Cancellation and Discharge Note  Treatment cancelled today due to patient's refusal to participate. Pt politely declined PT as he has been discharged and is awaiting transportation home.   [name] 08/14/2011, 6:30 PM [name] L. [name] DPT [phone]

## 2011-08-14 NOTE — Progress Notes (Signed)
Speech Language Pathology Treatment Patient Details Name: Terry Robertson MRN: [id number] DOB: 08-04-1928 Today's Date: 08/14/2011 Time: 4098-1191 SLP Time Calculation (min): 20 min  Assessment / Plan / Recommendation Clinical Impression  See skilled treatment section    SLP Plan  Continue with current plan of care       SLP Goals  SLP Goals SLP Goal #1 - Progress: Progressing toward goal SLP Goal #2 - Progress: Progressing toward goal SLP Goal #3 - Progress: Progressing toward goal SLP Goal #4 - Progress: Progressing toward goal  General Respiratory Status: Supplemental O2 delivered via (comment) Behavior/Cognition: Alert;Cooperative;Pleasant mood Patient Positioning: Upright in chair  Oral Cavity - Oral Hygiene     Treatment Treatment focused on: Aphasia Skilled Treatment: Pt. seen for aphasia treatment.  Pt. required mod verbal cues to recall and give example of word finding strategy.  Pt. described picture scene with mild verbal cues.  He required mod cues during reading activity to use finger to point to each word to assist with visual  organization.  Max verbal and visual cues to write letters of alphabe.  Pt. continuing to make progress and ST will continue to see for ahasia treatment while in acute care with recommended ST at  next venue.  Pt. motivated to improve.   Royce Macadamia 08/14/2011, 3:11 PM

## 2011-08-14 NOTE — Progress Notes (Signed)
ANTICOAGULATION CONSULT NOTE - Follow Up Consult  Pharmacy Consult for Coumadin Indication: atrial fibrillation  No Known Allergies  Patient Measurements: Height: 5\' 5"  (165.1 cm) Weight: 236 lb 15.9 oz (107.5 kg) IBW/kg (Calculated) : 61.5   Vital Signs: Temp: 98 F (36.7 C) (06/10 0500) Temp src: Oral (06/10 0500) BP: 133/64 mmHg (06/10 0500) Pulse Rate: 66  (06/10 0500)  Labs:  Basename 08/14/11 0522 08/13/11 0630 08/12/11 0605  HGB 11.0* 11.3* --  HCT 35.7* 35.9* 35.9*  PLT 203 190 169  APTT -- -- --  LABPROT 15.4* 14.4 13.7  INR 1.19 1.10 1.03  HEPARINUNFRC -- -- --  CREATININE -- -- --  CKTOTAL -- -- --  CKMB -- -- --  TROPONINI -- -- --    Estimated Creatinine Clearance: 83.2 ml/min (by C-G formula based on Cr of 0.74).  Assessment: 82 yom with new onset afib w RVR off heparin d/t acute left MCA infarct and on coumadin with plan as a bridge. Pt has received 4 days of coumadin and pharmacy has been dosing aggressively d/t his weight. INR remains subtherapeutic with little movement but expect to see movement in next day or so. CBC stable and no bleeding noted.  Goal of Therapy:  INR 2-3 Monitor platelets by anticoagulation protocol: Yes   Plan:  1. Coumadin 12.5 mg po x 1 dose today at 1800 2. F/u PT/INR tomorrow AM  Thank you,  Christoper Fabian, PharmD, BCPS Clinical pharmacist, pager [phone] 08/14/2011 9:33 AM

## 2011-08-14 NOTE — Progress Notes (Signed)
HOME HEALTH AGENCIES SERVING [place]   Agencies that are Medicare-Certified and are affiliated with [facility]  Telephone Number Address  [facility]   [facility] has ownership interest in this company; however, you are under no obligation to use this agency. [phone] or  [phone] [address] [place], [place] [address]   Agencies that are Medicare-Certified and are not affiliated with [facility] Agency Telephone Number Address  [facility] [phone] Fax [phone] [address] [place], [place]  [address]  [facility] [phone] or [phone] Fax [phone] [address] [place], [place] [address]  [facility] [phone] Fax [phone] [address] [place], [place] [address]  [facility] [phone] Fax [phone] [address] [place], [place]  [address]  [facility] [phone] Fax [phone] [address] Suite [place], [place] [address]  [facility] [phone] [address] [place], [place] [address]  [facility] [phone]  [address] [place], [place] [address]  [facility] [phone] or [phone] Fax [phone] [address] [place], [place]  [address]  [facility] [phone] Fax [phone] [address] [place], [place]  [address]  [facility]  [phone] Fax [phone] [address] [place], [place] [address]               Agencies that are not Medicare-Certified and are not affiliated with [facility] Agency Telephone Number Address  [facility] [place] [phone] or [phone] Fax [phone] [address] [place]  [address]  [facility] [phone] Fax [phone] [address] [place], [place]  [address]  [facility]  [phone] Fax [phone] [address] [place], [place] [address]  [facility] [phone] Fax [phone] [address] [place], [place] [address]  [facility] [phone] or [phone] Fax [phone] [address] [place], [place]  [address]  [facility] [phone] or [phone] Fax [phone] [address] [place], [place]  [address]  [facility] [phone] Fax [phone] [address] [place], [place]  [address]  [facility] [phone] [address] [place], [place]  [address]  [facility] [phone] Fax [phone] [address] [place], [place]  [address]  [facility] [phone] Fax [phone] [address] [place], [place]  [address]  [facility] [phone] Fax [phone] [address] [place], [place] [address]  [facility] [phone] Fax [phone] [address] [place], [place]  [address]   Noted need for home health services. In to speak with patient to offer choice of agencies. [facility]  ch[facility]. Appropriate referrals will be made.

## 2011-08-14 NOTE — Progress Notes (Signed)
Pt and family given DC instructions and verbalized understanding.  Pt DC home via wc.  

## 2011-08-14 NOTE — Progress Notes (Signed)
[facility]  Subjective: Doing better  Objective: Vital signs in last 24 hours: Temp:  [97.7 F (36.5 C)-98 F (36.7 C)] 98 F (36.7 C) (06/10 0500) Pulse Rate:  [57-80] 76  (06/10 0957) Resp:  [20-26] 20  (06/10 0500) BP: (127-150)/(61-82) 138/75 mmHg (06/10 0957) SpO2:  [97 %-99 %] 97 % (06/10 0500) Weight:  [107.5 kg (236 lb 15.9 oz)] 107.5 kg (236 lb 15.9 oz) (06/10 0500) Last BM Date: 08/14/11  Intake/Output from previous day: 06/09 0701 - 06/10 0700 In: 820 [P.O.:820] Out: 850 [Urine:850] Intake/Output this shift: Total I/O In: 240 [P.O.:240] Out: 200 [Urine:200]  Medications Current Facility-Administered Medications  Medication Dose Route Frequency Provider Last Rate Last Dose  . acetaminophen (TYLENOL) tablet 650 mg  650 mg Oral Q4H PRN Wilburt Finlay, PA      . ALPRAZolam Prudy Feeler) tablet 0.25 mg  0.25 mg Oral TID PRN Abelino Derrick, PA   0.25 mg at 08/06/11 2303  . aspirin EC tablet 81 mg  81 mg Oral Daily Kendra P Hiatt, PHARMD   81 mg at 08/14/11 0954  . atenolol (TENORMIN) tablet 12.5 mg  12.5 mg Oral Daily Wilburt Finlay, PA   12.5 mg at 08/14/11 0957  . atorvastatin (LIPITOR) tablet 10 mg  10 mg Oral q1800 [name], PHARMD   10 mg at 08/13/11 1706  . diltiazem (CARDIZEM CD) 24 hr capsule 180 mg  180 mg Oral Daily Marykay Lex, MD   180 mg at 08/14/11 0957  . diltiazem (CARDIZEM) tablet 60 mg  60 mg Oral Q8H Marykay Lex, MD   60 mg at 08/13/11 2232  . lisinopril (PRINIVIL,ZESTRIL) tablet 10 mg  10 mg Oral Daily Marykay Lex, MD   10 mg at 08/14/11 0957  . morphine 2 MG/ML injection 2 mg  2 mg Intravenous Q4H PRN Marykay Lex, MD   2 mg at 08/06/11 2205  . nitroGLYCERIN (NITROSTAT) SL tablet 0.4 mg  0.4 mg Sublingual Q5 Min x 3 PRN Wilburt Finlay, PA   0.4 mg at 08/06/11 1150  . omega-3 acid ethyl esters (LOVAZA) capsule 1 g  1 g Oral BID Wilburt Finlay, PA   1 g at 08/14/11 0956  . ondansetron (ZOFRAN) injection 4 mg  4  mg Intravenous Q6H PRN Wilburt Finlay, PA      . STUDY MEDICATION 1 capsule  1 capsule Oral QAC breakfast [name], PHARMD   1 capsule at 08/14/11 0800  . STUDY MEDICATION 1 capsule  1 capsule Oral QAC breakfast [name], PHARMD   1 capsule at 08/14/11 0800  . traMADol (ULTRAM) tablet 50 mg  50 mg Oral Q6H PRN Abelino Derrick, PA      . warfarin (COUMADIN) tablet 12 mg  12 mg Oral ONCE-1800 Brett Fairy, PHARMD   12 mg at 08/13/11 1706  . warfarin (COUMADIN) tablet 12.5 mg  12.5 mg Oral ONCE-1800 Lavonia Dana, [place]      . Warfarin - Pharmacist Dosing Inpatient   Does not apply q1800 [name], PHARMD      . zolpidem (AMBIEN) tablet 5 mg  5 mg Oral QHS PRN Abelino Derrick, PA        PE: General appearance: alert, cooperative and no distress Lungs: clear to auscultation bilaterally Heart: regular rate and rhythm and 16 sys MM Extremities: No LEE Pulses: 2+ and symmetric Neuro:  LAert and orient.  Improved word finding. facial  features symmetric.  Lab Results:   Basename 08/14/11 0522 08/13/11 0630 08/12/11 0605  WBC 3.0* 3.0* 3.6*  HGB 11.0* 11.3* 11.3*  HCT 35.7* 35.9* 35.9*  PLT 203 190 169   BMET No results found for this basename: NA:3,K:3,CL:3,CO2:3,GLUCOSE:3,BUN:3,CREATININE:3,CALCIUM:3 in the last 72 hours PT/INR  Basename 08/14/11 0522 08/13/11 0630 08/12/11 0605  LABPROT 15.4* 14.4 13.7  INR 1.19 1.10 1.03    Assessment/Plan  Principal Problem:  *Acute stroke - possibly  cardioembolic (08/09/2011) Active Problems:  Atrial fibrillation - New Diagnosis  Hypotension - resolved; due to poor PO intake with continued Antihypertensive therapy  CAD (coronary artery disease) PCI to circumflex in 1997.  100% occluded RCA  Morbid obesity  Myelodysplastic syndrome  Dyslipidemia  HTN (hypertension)  S/P TURP Oct 2012  DM type 2 (diabetes mellitus, type 2)  Diastolic dysfunction: Grade 2  Thrombocytopenia  Plan:  INR is creeping up.  Pt can  be DCd home with followup INR this week.  No lovenox/heparin per neuro.  The risk of bleed is too great.   LOS: 9 days    HAGER, BRYAN 08/14/2011 10:01 AM  I have seen & examined Terry Robertson this AM. He continues to progress well. I agree with the findings & assessment / plan as noted by [name] above.  I am not sure that we will ever figure out the etiology of his presenting Sx & hypotension. Best guess is that he had a viral syndrome --> leading to dehydration that,in the setting of taking PO BP meds led to hypotension.   BP now back up to stable range -- Added back ACE-I yesterday, not restarting Amlodipine. Will allow for mild permissive hypertension in the post CVA period. Anticipate increasing ACE-I to 20mg  po daily by d/c if BP remains at current level.   HR stable - no further Afib.   CVA -- Sxs are improving. PT/OT following - may do fine with [facility] PT/OT on discharge. On ASA & Warfarin   Will continue ASA 81 mg for CAD prophylaxis.   INR just now starting to rise. Discussed potentially bridging with Lovenox - thought to be too high risk of hemorrhagic transformation despite being ~5 days out.   Based upon Neurology recommendations / assessment, they feel he was stable for discharge with initiation of Warfarin.  His INR is finally responding & nticipate INR starting to rise in the next day or so -- will need close INR f/u.    Thankfully, he is showing signs of neurologic recovery.  As his BP is stable & HR / rhythm is also stable on current regimen, he has no Cardiovascular reason to prevent discharge.  Based on his current stability & knowing that he will have close f/u for INR check @ [facility], We can discharge him today once home PT&OT have been secured.  - either this evening or tomorrow AM.  Marykay Lex, M.D., M.S. [facility] [address] [place], [place]  [address]  [phone] Pager # [phone]  08/14/2011 11:16 AM

## 2011-08-15 NOTE — Discharge Summary (Signed)
Physician Discharge Summary  Patient ID: Terry Robertson MRN: [id number] DOB/AGE: [birth year] 78 y.o.  Admit date: 08/05/2011 Discharge date: 08/15/2011  Admission Diagnoses:  Discharge Diagnoses:  Principal Problem:  *Acute stroke - possibly  cardioembolic (08/09/2011) Active Problems:  Atrial fibrillation - New Diagnosis  Hypotension - resolved; due to poor PO intake with continued Antihypertensive therapy  CAD (coronary artery disease) PCI to circumflex in 1997.  100% occluded RCA  Morbid obesity  Myelodysplastic syndrome  Dyslipidemia  HTN (hypertension)  S/P TURP Oct 2012  DM type 2 (diabetes mellitus, type 2)  Diastolic dysfunction: Grade 2  Thrombocytopenia   Discharged Condition: stable  Hospital Course:   The patient is an 78 year-old male with history of coronary artery disease status post PCI to the left circumflex in 1997 and his most recent heart catheterization was December of 08. This revealed a widely patent circumflex stent in the OM branch, normal LAD and 100% occluded RCA in the distal portion near small PDA. Ejection fraction was 55%. Patient has not had a stress test since the last cath. Echocardiogram on 12/29/2010 showed mild concentric LVH, EF greater than 55% with impaired relaxation. Mild sclerotic aortic valve with mild stenosis. His history also includes morbid obesity, dyslipidemia, hypertension, myelodysplastic syndrome with mild anemia and neutropenia, status post TURP in October 2012, diabetes mellitus.   Patient presented with 2 days of weakness and night sweats. Patient states he is felt to "drowsy" starting 2 days ago. He stated he'd  been short of breath, diaphoretic, with a trace of lower extremity edema and increasing urinary urgency.  He denied orthopnea, chest pain, nausea, vomiting, fever, dysuria, hematuria, palpitations, dizziness. Patient had just seen by Dr. Herbie Baltimore on May 14 and then doing quite well.  His BNP was elevated at 113[address].0 however, the  patient's SCr was elevated and he was hypotensive.  Troponin was mildly elevated.  He was admitted to stepdown and started on dopamine to help with BP.  He did not appear hypervolemic despite elevated BNP.   He had an episode of acute left side cp which was worse with shoulder extension, abduction and palpation.  EKG showed 'J' Ppoint elevation in the lateral leads.   He was given IV hydration with NS.  Wean off dopamine.  BP improved. BNP decreased to 1250.0 and kidney function improved.  2D echo revealed an EF of 55-60% and grade one diastolic dysfunction.  He developed AFib with RVR and was started on IV heparin and IV diltiazem.  His rate responded appropriately.  PT evaluated the patient on 08/08/11.  On the evening of 6/4 the patient had a decreased in acuity and word finding ability.  CT of the head on 6/5 revealed 3 cm acute non hemorrhagic left parietal MCA territory infarction without hemorrhage.  Neurology was consulted.  Carotid dopplers completed(See below).  Neurology recommended  continued ASA, starting coumadin and DCing heparin.  The risk of hemorrhagic conversion was thought to be too high for bridging with Lovenox.  Speech therapy evaluated patient.  He continued to improved.  He converted spontaneously to NSR.  IV dilt was changed to PO.  Home Health was arranged.  The patient has 24 hour supervision from family and friends.  He was discharged home in stable condition after being seen by Dr. Herbie Baltimore.  Follow up INR check was arranged for Thursday at [facility].   Consults: neurology, PT, Speech Therapy  Significant Diagnostic Studies:  Carotid dopplers Summary: No significant extracranial carotid artery stenosis demonstrated. Vertebrals  are patent with antegrade flow.  2D echo Study Conclusions  - Left ventricle: The cavity size was normal. Systolic function was normal. The estimated ejection fraction was in the range of 55% to 65%. Wall motion was normal; there were no regional wall  motion abnormalities. Features are consistent with a pseudonormal left ventricular filling pattern, with concomitant abnormal relaxation and increased filling pressure (grade 2 diastolic dysfunction). Doppler parameters are consistent with both elevated ventricular end-diastolic filling pressure and elevated left atrial filling pressure. - Left atrium: The atrium was mildly dilated. - Atrial septum: The septum bowed from left to right, consistent with increased left atrial pressure. - Pericardium, extracardiac: A trivial, free-flowing pericardial effusion was identified circumferential to the heart and along the left ventricular free wall. Features were not consistent with tamponade physiology. There was a probable small left pleural effusion.   CBC    Component Value Date/Time   WBC 3.0* 08/14/2011 0522   WBC 2.4* 06/14/2011 1011   RBC 4.24 08/14/2011 0522   RBC 4.19* 06/14/2011 1011   HGB 11.0* 08/14/2011 0522   HGB 11.7* 06/14/2011 1011   HCT 35.7* 08/14/2011 0522   HCT 35.0* 06/14/2011 1011   PLT 203 08/14/2011 0522   PLT 93* 06/14/2011 1011   MCV 84.2 08/14/2011 0522   MCV 83.6 06/14/2011 1011   MCH 25.9* 08/14/2011 0522   MCH 27.9 06/14/2011 1011   MCHC 30.8 08/14/2011 0522   MCHC 33.3 06/14/2011 1011   RDW 14.2 08/14/2011 0522   RDW 14.4 06/14/2011 1011   LYMPHSABS 1.1 08/05/2011 1327   LYMPHSABS 1.2 06/14/2011 1011   MONOABS 2.4* 08/05/2011 1327   MONOABS 0.9 06/14/2011 1011   EOSABS 0.0 08/05/2011 1327   EOSABS 0.0 06/14/2011 1011   BASOSABS 0.0 08/05/2011 1327   BASOSABS 0.0 06/14/2011 1011    BMET    Component Value Date/Time   NA 137 08/09/2011 1001   K 4.0 08/09/2011 1001   CL 104 08/09/2011 1001   CO2 25 08/09/2011 1001   GLUCOSE 123* 08/09/2011 1001   BUN 12 08/09/2011 1001   CREATININE 0.74 08/09/2011 1001   CALCIUM 8.9 08/09/2011 1001   GFRNONAA 85* 08/09/2011 1001   GFRAA >90 08/09/2011 1001      Treatments:  IV diltiazem, heparin, Dopamine, IV fluids, PT/OT,    Discharge  Exam: Blood pressure 128/70, pulse 72, temperature 98.6 F (37 C), temperature source Oral, resp. rate 18, height 5\' 5"  (1.651 m), weight 107.5 kg (236 lb 15.9 oz), SpO2 97.00%.   Disposition: 01-Home or Self Care  Discharge Orders    Future Appointments: Provider: Department: Dept Phone: Center:   12/14/2011 10:00 AM Windell Hummingbird [facility] [phone] None   12/14/2011 10:30 AM Benjiman Core, MD [facility] [phone] None     Future Orders Please Complete By Expires   Diet - low sodium heart healthy      Increase activity slowly        Medication List  As of 08/15/2011  9:53 AM   STOP taking these medications         amLODipine 10 MG tablet      gabapentin 100 MG capsule      hydrochlorothiazide 25 MG tablet      HYDROcodone-acetaminophen 7.5-325 MG per tablet      isosorbide dinitrate 30 MG tablet      methocarbamol 500 MG tablet         TAKE these medications  aspirin 81 MG tablet   Take 81 mg by mouth daily.      atenolol 12.5 mg Tabs   Commonly known as: TENORMIN   Take 0.5 tablets (12.5 mg total) by mouth daily.      diltiazem 180 MG 24 hr capsule   Commonly known as: CARDIZEM CD   Take 1 capsule (180 mg total) by mouth daily.      lisinopril 10 MG tablet   Commonly known as: PRINIVIL,ZESTRIL   Take 1 tablet (10 mg total) by mouth daily.      nitroGLYCERIN 0.4 MG SL tablet   Commonly known as: NITROSTAT   Place 1 tablet (0.4 mg total) under the tongue every 5 (five) minutes x 3 doses as needed for chest pain.      omega-3 acid ethyl esters 1 G capsule   Commonly known as: LOVAZA   Take 1 g by mouth 2 (two) times daily.      simvastatin 20 MG tablet   Commonly known as: ZOCOR   Take 20 mg by mouth daily.      STUDY MEDICATION   Take 1 capsule by mouth daily. 2 bottles of a Study Drug for gout takes 1 capsule from each bottle once daily      terazosin 2 MG capsule   Commonly known as: HYTRIN   Take 2 mg by mouth daily.       warfarin 2.5 MG tablet   Commonly known as: COUMADIN   Take 3 tablets (7.5 mg total) by mouth one time only at 6 PM.           Follow-up Information    Follow up with Izetta Sakamoto W, MD. (This appt. is for a coumadin check.  Our office will call with your  the dates and times.)    Contact information:   [facility] [address] [place] [address] [phone]          Signed: Wilburt Finlay 08/15/2011, 9:53 AM  Mr. Hallenbeck continued to show steady improvement of his neurologic deficits from the CVA.  He has had no further Afib & his BP is stable. Initially admitted for hypotension -- for which the etiology has not been firmly established as it is not clinically apparent.  Would suggest an underlying viral syndrome leading to decreased PO intake with dehydration in the setting of aggressive Antihypertensive regimen.  BP improved by d/c - now on BB, Diltiazem & ACE-I at reduced doses.  On warfarin with INR finally starting to increase.  -- Per Neurology Stroke Team, too high risk for hemorrhagic conversion to use heparin bridging to therapeutic INR despite having CVA on heparin.   CVA Service suggested that he was stable for d/c before the weekend & signed off.  Based upon these recommendations & his continued improvement, he is otherwise stable for d/c with home health PT/OT.  Marykay Lex, M.D., M.S. [facility] [address] [place], [place]  [address]  [phone] Pager # [phone]  08/16/2011 9:11 AM

## 2011-08-22 ENCOUNTER — Emergency Department (HOSPITAL_COMMUNITY): Payer: Medicare Other

## 2011-08-22 ENCOUNTER — Encounter (HOSPITAL_COMMUNITY): Payer: Self-pay | Admitting: Emergency Medicine

## 2011-08-22 ENCOUNTER — Emergency Department (HOSPITAL_COMMUNITY)
Admission: EM | Admit: 2011-08-22 | Discharge: 2011-08-22 | Disposition: A | Discharge status: Home / Self Care | Payer: Medicare Other | Attending: Emergency Medicine | Admitting: Emergency Medicine

## 2011-08-22 DIAGNOSIS — Z87891 Personal history of nicotine dependence: Secondary | ICD-10-CM | POA: Insufficient documentation

## 2011-08-22 DIAGNOSIS — E119 Type 2 diabetes mellitus without complications: Secondary | ICD-10-CM | POA: Insufficient documentation

## 2011-08-22 DIAGNOSIS — I10 Essential (primary) hypertension: Secondary | ICD-10-CM | POA: Insufficient documentation

## 2011-08-22 DIAGNOSIS — M7530 Calcific tendinitis of unspecified shoulder: Secondary | ICD-10-CM | POA: Insufficient documentation

## 2011-08-22 DIAGNOSIS — Z8673 Personal history of transient ischemic attack (TIA), and cerebral infarction without residual deficits: Secondary | ICD-10-CM | POA: Insufficient documentation

## 2011-08-22 DIAGNOSIS — I2510 Atherosclerotic heart disease of native coronary artery without angina pectoris: Secondary | ICD-10-CM | POA: Insufficient documentation

## 2011-08-22 DIAGNOSIS — M6520 Calcific tendinitis, unspecified site: Secondary | ICD-10-CM

## 2011-08-22 HISTORY — DX: Cerebral infarction, unspecified: I63.9

## 2011-08-22 LAB — CBC
HCT: 37 % — ABNORMAL LOW (ref 39.0–52.0)
Hemoglobin: 11.7 g/dL — ABNORMAL LOW (ref 13.0–17.0)
WBC: 3.3 10*3/uL — ABNORMAL LOW (ref 4.0–10.5)

## 2011-08-22 LAB — DIFFERENTIAL
Band Neutrophils: 0 % (ref 0–10)
Basophils Absolute: 0 10*3/uL (ref 0.0–0.1)
Basophils Relative: 0 % (ref 0–1)
Blasts: 0 %
Lymphocytes Relative: 50 % — ABNORMAL HIGH (ref 12–46)
Lymphs Abs: 1.7 10*3/uL (ref 0.7–4.0)
Monocytes Absolute: 0.7 10*3/uL (ref 0.1–1.0)
Monocytes Relative: 22 % — ABNORMAL HIGH (ref 3–12)
Neutro Abs: 0.9 10*3/uL — ABNORMAL LOW (ref 1.7–7.7)
Neutrophils Relative %: 28 % — ABNORMAL LOW (ref 43–77)
Promyelocytes Absolute: 0 %

## 2011-08-22 LAB — COMPREHENSIVE METABOLIC PANEL
BUN: 11 mg/dL (ref 6–23)
CO2: 23 mEq/L (ref 19–32)
Calcium: 9.1 mg/dL (ref 8.4–10.5)
Creatinine, Ser: 0.9 mg/dL (ref 0.50–1.35)
GFR calc Af Amer: >90 mL/min (ref >90)
GFR calc non Af Amer: 78 mL/min — ABNORMAL LOW (ref >90)
Glucose, Bld: 92 mg/dL (ref 70–99)
Total Protein: 9.3 g/dL — ABNORMAL HIGH (ref 6.0–8.3)

## 2011-08-22 LAB — POCT I-STAT TROPONIN I: Troponin i, poc: 0 ng/mL (ref 0.00–0.08)

## 2011-08-22 LAB — PROTIME-INR: INR: 1.38 (ref 0.00–1.49)

## 2011-08-22 MED ORDER — HYDROCODONE-ACETAMINOPHEN 5-325 MG PO TABS
1.0000 | ORAL_TABLET | ORAL | Status: DC | PRN
Start: 2011-08-22 — End: 2011-08-22

## 2011-08-22 MED ORDER — HYDROCODONE-ACETAMINOPHEN 5-325 MG PO TABS: 1.0000 | ORAL_TABLET | ORAL | Status: AC | PRN

## 2011-08-22 MED ORDER — NAPROXEN 375 MG PO TABS: 375.0000 mg | ORAL_TABLET | Freq: Two times a day (BID) | ORAL | Status: DC

## 2011-08-22 MED ORDER — IBUPROFEN 200 MG PO TABS
600.0000 mg | ORAL_TABLET | Freq: Once | ORAL | Status: AC
Start: 2011-08-22 — End: 2011-08-22
  Administered 2011-08-22: 600 mg via ORAL
  Filled 2011-08-22: qty 3

## 2011-08-22 MED ORDER — TRAMADOL HCL 50 MG PO TABS
50.0000 mg | ORAL_TABLET | Freq: Once | ORAL | Status: AC
  Administered 2011-08-22: 50 mg via ORAL
  Filled 2011-08-22: qty 1

## 2011-08-22 NOTE — ED Provider Notes (Signed)
History     CSN: [id number]  Arrival date & time 08/22/11  1411   First MD Initiated Contact with Patient 08/22/11 1635      Chief Complaint  Patient presents with  . Arm Pain    (Consider location/radiation/quality/duration/timing/severity/associated sxs/prior treatment) HPI Pt has had 2 days of l shoulder pain. No inciting events. Pain with ROM. Tenderness to palpation. No chest pain, fever, chills, neck pain, numbness, weakness.  Past Medical History  Diagnosis Date  . Coronary artery disease   . Diabetes mellitus   . Hypertension   . Stroke     Past Surgical History  Procedure Date  . Cardiac surgery     History reviewed. No pertinent family history.  History  Substance Use Topics  . Smoking status: Former Smoker    Types: Cigarettes    Quit date: 03/08/1988  . Smokeless tobacco: Not on file  . Alcohol Use:       Review of Systems  Constitutional: Negative for fever and chills.  HENT: Negative for neck pain.   Respiratory: Negative for cough and shortness of breath.   Cardiovascular: Negative for chest pain, palpitations and leg swelling.  Gastrointestinal: Negative for nausea, vomiting and abdominal pain.  Musculoskeletal: Positive for myalgias and arthralgias. Negative for back pain and joint swelling.  Skin: Negative for rash and wound.  Neurological: Negative for dizziness, weakness, numbness and headaches.    Allergies  Review of patient's allergies indicates no known allergies.  Home Medications   Current Outpatient Rx  Name Route Sig Dispense Refill  . ASPIRIN EC 81 MG PO TBEC Oral Take 81 mg by mouth daily.    . ATENOLOL 12.5 MG HALF TABLET Oral Take 0.5 tablets (12.5 mg total) by mouth daily. 303 tablet 5  . DILTIAZEM HCL ER COATED BEADS 180 MG PO CP24 Oral Take 1 capsule (180 mg total) by mouth daily. 30 capsule 5  . LISINOPRIL 10 MG PO TABS Oral Take 1 tablet (10 mg total) by mouth daily. 303 tablet 5  . OMEGA-3-ACID ETHYL ESTERS 1 G PO  CAPS Oral Take 1 g by mouth 2 (two) times daily.    Marland Kitchen SIMVASTATIN 20 MG PO TABS Oral Take 20 mg by mouth daily.    . STUDY MEDICATION Oral Take 2 capsules by mouth daily. 2 bottles of a Study Drug for gout takes 1 capsule from each bottle once daily    . TERAZOSIN HCL 2 MG PO CAPS Oral Take 2 mg by mouth daily.    . WARFARIN SODIUM 2.5 MG PO TABS Oral Take 5 mg by mouth daily. Sunday, Tuesday, Thursday, saturday    . WARFARIN SODIUM 2.5 MG PO TABS Oral Take 7.5 mg by mouth daily. Monday, Wednesday, friday    . HYDROCODONE-ACETAMINOPHEN 5-325 MG PO TABS Oral Take 1 tablet by mouth every 4 (four) hours as needed for pain. 20 tablet 0  . NAPROXEN 375 MG PO TABS Oral Take 1 tablet (375 mg total) by mouth 2 (two) times daily. 20 tablet 0  . NITROGLYCERIN 0.4 MG SL SUBL Sublingual Place 1 tablet (0.4 mg total) under the tongue every 5 (five) minutes x 3 doses as needed for chest pain. 25 tablet 5    BP 152/63  Pulse 55  Temp 98.4 F (36.9 C) (Oral)  Resp 28  SpO2 95%  Physical Exam  Nursing note and vitals reviewed. Constitutional: He is oriented to person, place, and time. He appears well-developed and well-nourished. No distress.  HENT:  Head: Normocephalic and atraumatic.  Mouth/Throat: Oropharynx is clear and moist.  Eyes: EOM are normal. Pupils are equal, round, and reactive to light.  Neck: Normal range of motion. Neck supple.  Cardiovascular: Normal rate and regular rhythm.   Pulmonary/Chest: Effort normal and breath sounds normal. No respiratory distress. He has no wheezes. He has no rales. He exhibits no tenderness.  Abdominal: Soft. Bowel sounds are normal. There is no tenderness. There is no rebound and no guarding.  Musculoskeletal: He exhibits tenderness (Tenderness to palp over later surface of L deltoid. Decreased ROM due to pain. Sensation distally intact, good grip strength, 2+ radial pulse, no swelling. No definite trauma). He exhibits no edema.  Neurological: He is alert  and oriented to person, place, and time.  Skin: Skin is warm and dry. No rash noted. No erythema.  Psychiatric: He has a normal mood and affect. His behavior is normal.    ED Course  Procedures (including critical care time)  Labs Reviewed  COMPREHENSIVE METABOLIC PANEL - Abnormal; Notable for the following:    Total Protein 9.3 (*)     Albumin 3.2 (*)     GFR calc non Af Amer 78 (*)     All other components within normal limits  CBC - Abnormal; Notable for the following:    WBC 3.3 (*)     Hemoglobin 11.7 (*)     HCT 37.0 (*)     Platelets 139 (*)     All other components within normal limits  DIFFERENTIAL - Abnormal; Notable for the following:    Neutrophils Relative 28 (*)     Lymphocytes Relative 50 (*)     Monocytes Relative 22 (*)     Neutro Abs 0.9 (*)     All other components within normal limits  PROTIME-INR - Abnormal; Notable for the following:    Prothrombin Time 17.2 (*)     All other components within normal limits  POCT I-STAT TROPONIN I   Dg Shoulder Left  08/22/2011  *RADIOLOGY REPORT*  Clinical Data: Shoulder pain, no injury  LEFT SHOULDER - 2+ VIEW  Comparison: None.  Findings: Normal alignment and no fracture.  Mild degenerative change at the rotator cuff insertion with probable mild calcific tendonitis.  AC joint shows mild degenerative change.  IMPRESSION: No acute bony abnormality.  Probable mild calcific tendonitis.  Original Report Authenticated By: Camelia Phenes, M.D.     1. Calcific tendinitis       MDM          Loren Racer, MD 08/22/11 2326

## 2011-08-22 NOTE — Discharge Instructions (Signed)
Calcific Tendonitis  Calcific tendonitis causes a joint to become stiff and painful. It occurs when crystals of calcium become deposited within a tendon. Tendons are bands of very strong fibrous tissue that attach muscle to bone. Tendons are an important part of joints. They make the joint move and they absorb some of the stress that a joint receives during use. When calcium is deposited along the tendon, the tendon stiffens. The tendon becomes inflamed, and this can include swelling. The swelling can add to the joint becoming stiff and painful. Calcific tendonitis occurs frequently in the shoulder joint, in a structure called the rotator cuff.  CAUSES    Overuse of the tendon, such as may occur with repetitive motion.   Excess stress on the tendon.   Aging.   Repetitive low level injury.  SYMPTOMS    Pain when moving the joint.   Tenderness when pressure is applied to the tendon.   A snapping or popping sound when the joint moves.   Decreased motion of the joint.   Difficulty sleeping due to pain in the joint.  DIAGNOSIS   Your caregiver might find one or more of the following on exam of your shoulder:   If the joint with the affected tendon can still move the normal amount (range of motion).   Tenderness when pressure is applied to the affected tendon.   Limited strength of the limb due to pain in the joint  Imaging tests are sometimes used to diagnose calcific tendonitis, such as:   X-rays: examines the joint, adjacent bones and will identify calcium deposits   CT scan: uses x-rays and computers to produce a more detailed image of the joint.   MRI scan: uses powerful magnets and radio waves to create a detailed image of the tendons and other soft tissues around the joint.  TREATMENT   Treatment for calcific tendonitis may include:   Taking over-the-counter medicines for pain or discomfort as directed by your caregiver.   Applying ice packs to the joint.   Following a special exercise regimen to  keep the joint working properly.   Attending physical therapy sessions.   Avoiding activities that cause pain.  Treatment for more severe calcific tendonitis may require:   Injecting steroids or pain relieving medicines into or around the joint.   Manipulation of the joint under an anesthetic.   Inflation of the involved joint with sterile fluid to increase the flexibility of the adjacent tendons.   Surgery to clean out the calcium deposits and repairing tendons where necessary which will allow the joint to move normally.  HOME CARE INSTRUCTIONS    Take medicine as prescribed by your caregiver.   Follow your caregiver's recommendations regarding activity and exercise. It is important to keep moving the joint to prevent it from freezing up. It is also important not to do things that may further injure it.  SEEK MEDICAL CARE IF:   You notice an increase in pain.   You develop new weakness.   You have increased swelling.   You notice either increased joint stiffness or a sensation of looseness in the joint.   You notice increasing redness, swelling, or warmth around the area of the joint.  SEEK IMMEDIATE MEDICAL CARE IF:  You have an unexplained oral temperature above 102.0 F (38.9 C) develops.  Document Released: 11/30/2007 Document Revised: 02/09/2011 Document Reviewed: 11/30/2007  [employer] Patient Information 2012 [employer].

## 2011-08-22 NOTE — ED Notes (Signed)
Patient transported to X-ray 

## 2011-08-22 NOTE — Progress Notes (Signed)
Orthopedic Tech Progress Note Patient Details:  Terry Robertson 09/02/1928 [id number]  Ortho Devices Type of Ortho Device: Arm foam sling Ortho Device/Splint Location: left arm Ortho Device/Splint Interventions: Application   Nikki Dom 08/22/2011, 6:23 PM

## 2011-08-22 NOTE — ED Notes (Signed)
Pt c/o increased swelling to left arm with pain in left shoulder x several days; pt sts recently discharged for CVA; CMS intact; bruising noted to forearm from PIV

## 2011-08-23 LAB — PATHOLOGIST SMEAR REVIEW

## 2011-11-28 ENCOUNTER — Encounter (HOSPITAL_COMMUNITY): Payer: Self-pay

## 2011-11-28 ENCOUNTER — Inpatient Hospital Stay (HOSPITAL_COMMUNITY)
Admission: EM | Admit: 2011-11-28 | Discharge: 2011-12-08 | DRG: 208 | Disposition: A | Discharge status: Skilled Nursing Facility | Payer: Medicare Other | Attending: Pulmonary Disease | Admitting: Pulmonary Disease

## 2011-11-28 ENCOUNTER — Emergency Department (HOSPITAL_COMMUNITY): Payer: Medicare Other

## 2011-11-28 DIAGNOSIS — R042 Hemoptysis: Secondary | ICD-10-CM | POA: Diagnosis present

## 2011-11-28 DIAGNOSIS — J9600 Acute respiratory failure, unspecified whether with hypoxia or hypercapnia: Principal | ICD-10-CM | POA: Diagnosis present

## 2011-11-28 DIAGNOSIS — D696 Thrombocytopenia, unspecified: Secondary | ICD-10-CM

## 2011-11-28 DIAGNOSIS — E876 Hypokalemia: Secondary | ICD-10-CM | POA: Diagnosis present

## 2011-11-28 DIAGNOSIS — R4182 Altered mental status, unspecified: Secondary | ICD-10-CM

## 2011-11-28 DIAGNOSIS — E870 Hyperosmolality and hypernatremia: Secondary | ICD-10-CM | POA: Diagnosis present

## 2011-11-28 DIAGNOSIS — I639 Cerebral infarction, unspecified: Secondary | ICD-10-CM

## 2011-11-28 DIAGNOSIS — D469 Myelodysplastic syndrome, unspecified: Secondary | ICD-10-CM

## 2011-11-28 DIAGNOSIS — I2510 Atherosclerotic heart disease of native coronary artery without angina pectoris: Secondary | ICD-10-CM | POA: Diagnosis present

## 2011-11-28 DIAGNOSIS — I10 Essential (primary) hypertension: Secondary | ICD-10-CM | POA: Diagnosis present

## 2011-11-28 DIAGNOSIS — G40401 Other generalized epilepsy and epileptic syndromes, not intractable, with status epilepticus: Secondary | ICD-10-CM | POA: Diagnosis present

## 2011-11-28 DIAGNOSIS — Z87891 Personal history of nicotine dependence: Secondary | ICD-10-CM

## 2011-11-28 DIAGNOSIS — I959 Hypotension, unspecified: Secondary | ICD-10-CM

## 2011-11-28 DIAGNOSIS — G40901 Epilepsy, unspecified, not intractable, with status epilepticus: Secondary | ICD-10-CM

## 2011-11-28 DIAGNOSIS — R569 Unspecified convulsions: Secondary | ICD-10-CM

## 2011-11-28 DIAGNOSIS — Z9079 Acquired absence of other genital organ(s): Secondary | ICD-10-CM

## 2011-11-28 DIAGNOSIS — Z7901 Long term (current) use of anticoagulants: Secondary | ICD-10-CM

## 2011-11-28 DIAGNOSIS — R29898 Other symptoms and signs involving the musculoskeletal system: Secondary | ICD-10-CM | POA: Diagnosis present

## 2011-11-28 DIAGNOSIS — E119 Type 2 diabetes mellitus without complications: Secondary | ICD-10-CM | POA: Diagnosis present

## 2011-11-28 DIAGNOSIS — I4891 Unspecified atrial fibrillation: Secondary | ICD-10-CM | POA: Diagnosis present

## 2011-11-28 DIAGNOSIS — I5189 Other ill-defined heart diseases: Secondary | ICD-10-CM

## 2011-11-28 DIAGNOSIS — I69998 Other sequelae following unspecified cerebrovascular disease: Secondary | ICD-10-CM

## 2011-11-28 DIAGNOSIS — E785 Hyperlipidemia, unspecified: Secondary | ICD-10-CM

## 2011-11-28 LAB — CBC WITH DIFFERENTIAL/PLATELET
Basophils Relative: 0 % (ref 0–1)
Eosinophils Relative: 0 % (ref 0–5)
HCT: 34.8 % — ABNORMAL LOW (ref 39.0–52.0)
Hemoglobin: 11 g/dL — ABNORMAL LOW (ref 13.0–17.0)
Lymphocytes Relative: 15 % (ref 12–46)
MCHC: 31.6 g/dL (ref 30.0–36.0)
Neutro Abs: 3.5 10*3/uL (ref 1.7–7.7)
Neutrophils Relative %: 61 % (ref 43–77)
RBC: 4.1 MIL/uL — ABNORMAL LOW (ref 4.22–5.81)

## 2011-11-28 LAB — COMPREHENSIVE METABOLIC PANEL
ALT: 9 U/L (ref 0–53)
AST: 19 U/L (ref 0–37)
CO2: 19 mEq/L (ref 19–32)
Calcium: 8.1 mg/dL — ABNORMAL LOW (ref 8.4–10.5)
Creatinine, Ser: 0.87 mg/dL (ref 0.50–1.35)
GFR calc Af Amer: >90 mL/min (ref >90)
GFR calc non Af Amer: 79 mL/min — ABNORMAL LOW (ref >90)
Glucose, Bld: 146 mg/dL — ABNORMAL HIGH (ref 70–99)
Sodium: 137 mEq/L (ref 135–145)
Total Protein: 8.3 g/dL (ref 6.0–8.3)

## 2011-11-28 LAB — PROCALCITONIN: Procalcitonin: <0.1 ng/mL

## 2011-11-28 LAB — GLUCOSE, CAPILLARY
Glucose-Capillary: 78 mg/dL (ref 70–99)
Glucose-Capillary: 86 mg/dL (ref 70–99)

## 2011-11-28 LAB — PROTIME-INR
INR: 1.63 — ABNORMAL HIGH (ref 0.00–1.49)
Prothrombin Time: 18.8 seconds — ABNORMAL HIGH (ref 11.6–15.2)

## 2011-11-28 LAB — URINALYSIS, ROUTINE W REFLEX MICROSCOPIC
Ketones, ur: NEGATIVE mg/dL
Leukocytes, UA: NEGATIVE
Protein, ur: 100 mg/dL — AB
Urobilinogen, UA: 0.2 mg/dL (ref 0.0–1.0)

## 2011-11-28 LAB — URINE MICROSCOPIC-ADD ON

## 2011-11-28 LAB — MAGNESIUM: Magnesium: 2.1 mg/dL (ref 1.5–2.5)

## 2011-11-28 LAB — TROPONIN I: Troponin I: <0.3 ng/mL (ref <0.30)

## 2011-11-28 MED ORDER — SODIUM CHLORIDE 0.9 % IV SOLN
1000.0000 mg | Freq: Once | INTRAVENOUS | Status: AC
  Administered 2011-11-28: 1000 mg via INTRAVENOUS
  Filled 2011-11-28 (×2): qty 10

## 2011-11-28 MED ORDER — METOPROLOL TARTRATE 1 MG/ML IV SOLN
5.0000 mg | Freq: Four times a day (QID) | INTRAVENOUS | Status: DC
  Filled 2011-11-28 (×5): qty 5

## 2011-11-28 MED ORDER — ETOMIDATE 2 MG/ML IV SOLN
INTRAVENOUS | Status: AC
  Administered 2011-11-28: 30 mg
  Filled 2011-11-28: qty 20

## 2011-11-28 MED ORDER — ASPIRIN 300 MG RE SUPP: 150.0000 mg | Freq: Every day | RECTAL | Status: DC

## 2011-11-28 MED ORDER — LIDOCAINE HCL (CARDIAC) 20 MG/ML IV SOLN
INTRAVENOUS | Status: AC
  Filled 2011-11-28: qty 5

## 2011-11-28 MED ORDER — LORAZEPAM 2 MG/ML IJ SOLN
INTRAMUSCULAR | Status: AC
  Filled 2011-11-28: qty 1

## 2011-11-28 MED ORDER — CHLORHEXIDINE GLUCONATE 0.12 % MT SOLN
15.0000 mL | Freq: Two times a day (BID) | OROMUCOSAL | Status: DC
  Administered 2011-11-28 – 2011-12-08 (×16): 15 mL via OROMUCOSAL
  Filled 2011-11-28 (×23): qty 15

## 2011-11-28 MED ORDER — ASPIRIN 300 MG RE SUPP
300.0000 mg | RECTAL | Status: AC
  Administered 2011-11-28: 300 mg via RECTAL
  Filled 2011-11-28: qty 1

## 2011-11-28 MED ORDER — ROCURONIUM BROMIDE 50 MG/5ML IV SOLN
INTRAVENOUS | Status: AC
  Filled 2011-11-28: qty 2

## 2011-11-28 MED ORDER — CHLORHEXIDINE GLUCONATE 0.12 % MT SOLN
OROMUCOSAL | Status: AC
  Administered 2011-11-28: 15 mL
  Filled 2011-11-28: qty 15

## 2011-11-28 MED ORDER — DILTIAZEM HCL 30 MG PO TABS: 30.0000 mg | ORAL_TABLET | Freq: Four times a day (QID) | ORAL | Status: DC

## 2011-11-28 MED ORDER — FENTANYL CITRATE 0.05 MG/ML IJ SOLN
INTRAMUSCULAR | Status: AC
  Administered 2011-11-28: 100 ug
  Filled 2011-11-28: qty 2

## 2011-11-28 MED ORDER — PROPOFOL 10 MG/ML IV EMUL
5.0000 ug/kg/min | INTRAVENOUS | Status: DC
  Administered 2011-11-28: 15 ug/kg/min via INTRAVENOUS
  Administered 2011-11-28: 10 ug/kg/min via INTRAVENOUS

## 2011-11-28 MED ORDER — ASPIRIN 81 MG PO CHEW: 324.0000 mg | CHEWABLE_TABLET | ORAL | Status: AC

## 2011-11-28 MED ORDER — ETOMIDATE 2 MG/ML IV SOLN
INTRAVENOUS | Status: AC
  Administered 2011-11-28: 20 mg
  Filled 2011-11-28: qty 20

## 2011-11-28 MED ORDER — SODIUM CHLORIDE 0.9 % IV SOLN
50.0000 ug/h | INTRAVENOUS | Status: DC
  Administered 2011-11-28: 50 ug/h via INTRAVENOUS
  Filled 2011-11-28: qty 50

## 2011-11-28 MED ORDER — MIDAZOLAM HCL 2 MG/2ML IJ SOLN
INTRAMUSCULAR | Status: AC
  Administered 2011-11-28: 2 mg
  Filled 2011-11-28: qty 2

## 2011-11-28 MED ORDER — SUCCINYLCHOLINE CHLORIDE 20 MG/ML IJ SOLN
INTRAMUSCULAR | Status: AC
  Administered 2011-11-28: 100 mg
  Filled 2011-11-28: qty 10

## 2011-11-28 MED ORDER — PANTOPRAZOLE SODIUM 40 MG IV SOLR
40.0000 mg | Freq: Every day | INTRAVENOUS | Status: DC
  Administered 2011-11-28 – 2011-12-04 (×7): 40 mg via INTRAVENOUS
  Filled 2011-11-28 (×8): qty 40

## 2011-11-28 MED ORDER — CARVEDILOL 3.125 MG PO TABS: 3.1250 mg | ORAL_TABLET | Freq: Two times a day (BID) | ORAL | Status: DC

## 2011-11-28 MED ORDER — BIOTENE DRY MOUTH MT LIQD
15.0000 mL | Freq: Four times a day (QID) | OROMUCOSAL | Status: DC
  Administered 2011-11-29 – 2011-12-08 (×28): 15 mL via OROMUCOSAL

## 2011-11-28 MED ORDER — INSULIN ASPART 100 UNIT/ML ~~LOC~~ SOLN
2.0000 [IU] | SUBCUTANEOUS | Status: DC
  Administered 2011-11-28 – 2011-12-01 (×3): 2 [IU] via SUBCUTANEOUS
  Administered 2011-12-01: 4 [IU] via SUBCUTANEOUS
  Administered 2011-12-01: 2 [IU] via SUBCUTANEOUS
  Administered 2011-12-01: 4 [IU] via SUBCUTANEOUS
  Administered 2011-12-01 (×2): 2 [IU] via SUBCUTANEOUS
  Administered 2011-12-02 (×3): 4 [IU] via SUBCUTANEOUS
  Administered 2011-12-02: 2 [IU] via SUBCUTANEOUS
  Administered 2011-12-02 – 2011-12-03 (×4): 4 [IU] via SUBCUTANEOUS
  Administered 2011-12-03: 2 [IU] via SUBCUTANEOUS
  Administered 2011-12-03 (×4): 4 [IU] via SUBCUTANEOUS
  Administered 2011-12-04: 2 [IU] via SUBCUTANEOUS
  Administered 2011-12-04: 4 [IU] via SUBCUTANEOUS

## 2011-11-28 MED ORDER — SODIUM CHLORIDE 0.9 % IV SOLN
500.0000 mg | Freq: Two times a day (BID) | INTRAVENOUS | Status: DC
  Filled 2011-11-28: qty 5

## 2011-11-28 MED ORDER — FENTANYL BOLUS VIA INFUSION
50.0000 ug | Freq: Four times a day (QID) | INTRAVENOUS | Status: DC | PRN
  Administered 2011-11-28: 50 ug via INTRAVENOUS
  Filled 2011-11-28: qty 100

## 2011-11-28 MED ORDER — METOPROLOL TARTRATE 1 MG/ML IV SOLN: 5.0000 mg | Freq: Four times a day (QID) | INTRAVENOUS | Status: DC

## 2011-11-28 MED ORDER — SODIUM CHLORIDE 0.9 % IV SOLN: 500.0000 mg | Freq: Two times a day (BID) | INTRAVENOUS | Status: DC

## 2011-11-28 MED ORDER — LEVETIRACETAM 500 MG/5ML IV SOLN
500.0000 mg | Freq: Two times a day (BID) | INTRAVENOUS | Status: DC
  Administered 2011-11-28 – 2011-12-06 (×16): 500 mg via INTRAVENOUS
  Filled 2011-11-28 (×17): qty 5

## 2011-11-28 MED ORDER — PROPOFOL 10 MG/ML IV EMUL
5.0000 ug/kg/min | INTRAVENOUS | Status: DC
  Administered 2011-11-28: 15 ug/kg/min via INTRAVENOUS
  Administered 2011-11-29 (×2): 40 ug/kg/min via INTRAVENOUS
  Administered 2011-11-29 – 2011-11-30 (×3): 20 ug/kg/min via INTRAVENOUS
  Filled 2011-11-28 (×5): qty 100

## 2011-11-28 MED ORDER — SODIUM CHLORIDE 0.9 % IV SOLN: INTRAVENOUS | Status: DC

## 2011-11-28 MED ORDER — SUCCINYLCHOLINE CHLORIDE 20 MG/ML IJ SOLN
INTRAMUSCULAR | Status: AC
  Administered 2011-11-28: 120 mg
  Filled 2011-11-28: qty 10

## 2011-11-28 MED ORDER — PROPOFOL 10 MG/ML IV EMUL
INTRAVENOUS | Status: AC
  Administered 2011-11-28: 10 ug/kg/min via INTRAVENOUS
  Filled 2011-11-28: qty 100

## 2011-11-28 MED ORDER — SODIUM CHLORIDE 0.9 % IV SOLN
250.0000 mL | INTRAVENOUS | Status: DC | PRN
  Administered 2011-11-30 – 2011-12-03 (×3): 250 mL via INTRAVENOUS
  Administered 2011-12-04 – 2011-12-06 (×2): 500 mL via INTRAVENOUS

## 2011-11-28 NOTE — ED Notes (Signed)
eetomidate 10 mg given

## 2011-11-28 NOTE — ED Notes (Signed)
succinalcholine 100mg 

## 2011-11-28 NOTE — ED Notes (Addendum)
New intubation attempt, with bougee unsucessful, sats to 82%  baggaing

## 2011-11-28 NOTE — ED Notes (Signed)
Etomidate 10mg given

## 2011-11-28 NOTE — Progress Notes (Signed)
eLink Physician-Brief Progress Note Patient Name: Terry Robertson DOB: 10/03/1928 MRN: [id number]  Date of Service  11/28/2011   HPI/Events of Note   PULLING ON TUBE  eICU Interventions  Add soft restraints   Intervention Category Minor Interventions: Routine modifications to care plan (e.g. PRN medications for pain, fever)  Nelda Bucks. 11/28/2011, 3:58 PM

## 2011-11-28 NOTE — ED Notes (Addendum)
Patient arrived to ED, the moment he was moved from EMS stretcher to bed began having a seizure.  Monitors applied, MD to the bedside, second IV placed in the right hand, airway cart to the bedside, nasal trumpet placed

## 2011-11-28 NOTE — ED Notes (Signed)
Intubation by Dr Rulon Abide, attempting 8.0 ET

## 2011-11-28 NOTE — ED Notes (Signed)
Family at bedside. 

## 2011-11-28 NOTE — ED Notes (Signed)
Pt to CT with RN

## 2011-11-28 NOTE — ED Notes (Signed)
Unable to get mouth open  bagging

## 2011-11-28 NOTE — ED Notes (Addendum)
Intubation attempt sats 98%

## 2011-11-28 NOTE — ED Notes (Addendum)
MD at bedside. [email or web address]

## 2011-11-28 NOTE — ED Notes (Addendum)
sucessful intubation, 7.5 ET tube 24 at the teeth

## 2011-11-28 NOTE — ED Notes (Addendum)
Second attempt, unsuccessful, sats 94% bagging

## 2011-11-28 NOTE — Consult Note (Signed)
[facility] CONSULT NOTE     Reason for Consult: seizure   HPI:    Terry Robertson is an 78 y.o. male who recently suffered a moderate sized non hemorrhagic infarct of the  posterior left opercular region into the posterior left frontal - parietal lobe on 08/05/11. Patient has known A-fib and was on Coumadin prior to CVA.  Patient was discharged home on 08/10/11 with recommendations to continue coumadin and bridge with ASA. On 9/23 to be driving erratically, went home and EMS was notified and found him sitting in a chair unable to speak but awake with right sided gaze, right arm twitching. In ED upon arrival, was noted to have seizure. Further decompensated and required intubation per EDP. While in the ED patient had 2 TC seizures.  Patient was loaded with Keppra 1 gram and 500 mg IV BID was initiated.  Currently patient is on fentanyl and propofol.  No further seizures have been noted. CT head showed no acute bleed, previous left parietal lobe infarct. Patient is no longer seizing.     Past Medical History  Diagnosis Date  . Coronary artery disease   . Diabetes mellitus   . Hypertension   . Stroke     Past Surgical History  Procedure Date  . Cardiac surgery     History reviewed. No pertinent family history.  Social History:  reports that he quit smoking about 23 years ago. His smoking use included Cigarettes. He does not have any smokeless tobacco history on file. He reports that he does not use illicit drugs. His alcohol history not on file.  No Known Allergies  Medications:    Prior to Admission:  Prescriptions prior to admission  Medication Sig Dispense Refill  . aspirin EC 81 MG tablet Take 81 mg by mouth daily.      Marland Kitchen atenolol (TENORMIN) 12.5 mg TABS Take 0.5 tablets (12.5 mg total) by mouth daily.  303 tablet  5  . diltiazem (CARDIZEM CD) 180 MG 24 hr capsule Take 1 capsule (180 mg total) by mouth daily.  30 capsule  5  . lisinopril  (PRINIVIL,ZESTRIL) 10 MG tablet Take 1 tablet (10 mg total) by mouth daily.  303 tablet  5  . naproxen (NAPROSYN) 375 MG tablet Take 1 tablet (375 mg total) by mouth 2 (two) times daily.  20 tablet  0  . nitroGLYCERIN (NITROSTAT) 0.4 MG SL tablet Place 1 tablet (0.4 mg total) under the tongue every 5 (five) minutes x 3 doses as needed for chest pain.  25 tablet  5  . omega-3 acid ethyl esters (LOVAZA) 1 G capsule Take 1 g by mouth 2 (two) times daily.      . simvastatin (ZOCOR) 20 MG tablet Take 20 mg by mouth daily.      . STUDY MEDICATION Take 2 capsules by mouth daily. 2 bottles of a Study Drug for gout takes 1 capsule from each bottle once daily      . terazosin (HYTRIN) 2 MG capsule Take 2 mg by mouth daily.      Marland Kitchen warfarin (COUMADIN) 2.5 MG tablet Take 5 mg by mouth daily. Sunday, Tuesday, Thursday, saturday      . warfarin (COUMADIN) 2.5 MG tablet Take 7.5 mg by mouth daily. Monday, Wednesday, friday       Scheduled:   . aspirin  324 mg Oral NOW  Or  . aspirin  300 mg Rectal NOW  . chlorhexidine      . etomidate      . etomidate      . fentaNYL      . insulin aspart  2-6 Units Subcutaneous Q4H  . levetiracetam  1,000 mg Intravenous Once  . levetiracetam  500 mg Intravenous Q12H  . lidocaine (cardiac) 100 mg/71ml      . lidocaine (cardiac) 100 mg/53ml      . LORazepam      . metoprolol  5 mg Intravenous Q6H  . midazolam      . pantoprazole (PROTONIX) IV  40 mg Intravenous QHS  . rocuronium      . rocuronium      . succinylcholine      . succinylcholine      . DISCONTD: carvedilol  3.125 mg Oral BID WC  . DISCONTD: diltiazem  30 mg Oral Q6H  . DISCONTD: levetiracetam  500 mg Intravenous Q12H  . DISCONTD: metoprolol  5 mg Intravenous Q6H     Blood pressure 132/70, pulse 63, temperature 98.2 F (36.8 C), resp. rate 20, weight 108 kg (238 lb 1.6 oz), SpO2 99.00%.   Neurologic Examination:   Mental Status: Intubated and sedated on fentanyl and propofol Cranial  Nerves: II: Discs flat bilaterally; No blink to threat III,IV, VI: ptosis not present, extra-ocular motions intact bilaterally, corneal reflex intact V,VII: face symmetrical and winces to pain bilaterally  VIII: looks to my voice IX,X: gag reflex present Motor: Patient follow no command but localizes to pain with left arm moves bilateral arms and legs spontaneously and antigravity.  He is moving his Left arm and leg greater than his right.  Sensory: With draws to pain in all 4 extremities.  Deep Tendon Reflexes: 1+ and symmetric throughout UE, 2+ left KJ, 1+ right KJ depressed bilateral AJ.  Plantars: Right: upgoing   Left: downgoing Cerebellar: unable to asses Gait: unable to asses CV: pulses palpable throughout     Lab Results  Component Value Date/Time   CHOL 100 08/10/2011  5:18 AM    Results for orders placed during the hospital encounter of 11/28/11 (from the past 48 hour(s))  URINALYSIS, ROUTINE W REFLEX MICROSCOPIC     Status: Abnormal   Collection Time   11/28/11  1:30 PM      Component Value Range Comment   Color, Urine YELLOW  YELLOW    APPearance CLOUDY (*) CLEAR    Specific Gravity, Urine 1.011  1.005 - 1.030    pH 5.0  5.0 - 8.0    Glucose, UA NEGATIVE  NEGATIVE mg/dL    Hgb urine dipstick MODERATE (*) NEGATIVE    Bilirubin Urine NEGATIVE  NEGATIVE    Ketones, ur NEGATIVE  NEGATIVE mg/dL    Protein, ur 161 (*) NEGATIVE mg/dL    Urobilinogen, UA 0.2  0.0 - 1.0 mg/dL    Nitrite NEGATIVE  NEGATIVE    Leukocytes, UA NEGATIVE  NEGATIVE   URINE MICROSCOPIC-ADD ON     Status: Abnormal   Collection Time   11/28/11  1:30 PM      Component Value Range Comment   Squamous Epithelial / LPF RARE  RARE    WBC, UA 0-2  <3 WBC/hpf    RBC / HPF 0-2  <3 RBC/hpf    Bacteria, UA RARE  RARE    Casts HYALINE CASTS (*) NEGATIVE    Urine-Other AMORPHOUS URATES/PHOSPHATES     COMPREHENSIVE METABOLIC PANEL  Status: Abnormal   Collection Time   11/28/11  2:40 PM      Component  Value Range Comment   Sodium 137  135 - 145 mEq/L    Potassium 4.1  3.5 - 5.1 mEq/L    Chloride 104  96 - 112 mEq/L    CO2 19  19 - 32 mEq/L    Glucose, Bld 146 (*) 70 - 99 mg/dL    BUN 11  6 - 23 mg/dL    Creatinine, Ser 1.61  0.50 - 1.35 mg/dL    Calcium 8.1 (*) 8.4 - 10.5 mg/dL    Total Protein 8.3  6.0 - 8.3 g/dL    Albumin 3.1 (*) 3.5 - 5.2 g/dL    AST 19  0 - 37 U/L    ALT 9  0 - 53 U/L    Alkaline Phosphatase 44  39 - 117 U/L    Total Bilirubin 0.4  0.3 - 1.2 mg/dL    GFR calc non Af Amer 79 (*) >90 mL/min    GFR calc Af Amer >90  >90 mL/min   CBC WITH DIFFERENTIAL     Status: Abnormal   Collection Time   11/28/11  2:40 PM      Component Value Range Comment   WBC 5.6  4.0 - 10.5 K/uL    RBC 4.10 (*) 4.22 - 5.81 MIL/uL    Hemoglobin 11.0 (*) 13.0 - 17.0 g/dL    HCT 09.6 (*) 04.5 - 52.0 %    MCV 84.9  78.0 - 100.0 fL    MCH 26.8  26.0 - 34.0 pg    MCHC 31.6  30.0 - 36.0 g/dL    RDW 40.9  81.1 - 91.4 %    Platelets 103 (*) 150 - 400 K/uL PLATELET COUNT CONFIRMED BY SMEAR   Neutrophils Relative 61  43 - 77 %    Lymphocytes Relative 15  12 - 46 %    Monocytes Relative 24 (*) 3 - 12 %    Eosinophils Relative 0  0 - 5 %    Basophils Relative 0  0 - 1 %    Neutro Abs 3.5  1.7 - 7.7 K/uL    Lymphs Abs 0.8  0.7 - 4.0 K/uL    Monocytes Absolute 1.3 (*) 0.1 - 1.0 K/uL    Eosinophils Absolute 0.0  0.0 - 0.7 K/uL    Basophils Absolute 0.0  0.0 - 0.1 K/uL    Smear Review LARGE PLATELETS PRESENT     MAGNESIUM     Status: Normal   Collection Time   11/28/11  2:40 PM      Component Value Range Comment   Magnesium 2.1  1.5 - 2.5 mg/dL   TROPONIN I     Status: Normal   Collection Time   11/28/11  2:41 PM      Component Value Range Comment   Troponin I <0.30  <0.30 ng/mL   PROTIME-INR     Status: Abnormal   Collection Time   11/28/11  2:41 PM      Component Value Range Comment   Prothrombin Time 18.8 (*) 11.6 - 15.2 seconds    INR 1.63 (*) 0.00 - 1.49   PROCALCITONIN      Status: Normal   Collection Time   11/28/11  2:41 PM      Component Value Range Comment   Procalcitonin <0.10       Ct Head Wo Contrast  11/28/2011  *RADIOLOGY  REPORT*  Clinical Data: Difficulty speaking seizure.  History high blood pressure.  CT HEAD WITHOUT CONTRAST  Technique:  Contiguous axial images were obtained from the base of the skull through the vertex without contrast.  Comparison: 08/09/2011 CT and MR.  Findings: No intracranial hemorrhage.  Remote moderate sized left parietal lobe infarct extending to posterior opercular region.  This was noted to be an acute infarct on the 08/09/2011 examination.  Remote infarcts right caudate head/lenticular nucleus.  No CT evidence of large acute infarct.  Global atrophy without hydrocephalus.  Small vessel disease type changes.  No intracranial mass lesion detected on this unenhanced exam.  Remote right medial orbital wall fracture versus congenital defect.  IMPRESSION: No intracranial hemorrhage.  Remote moderate sized left parietal lobe infarct extending to posterior opercular region.  This was noted to be an acute infarct on the 08/09/2011 examination.  Remote infarcts right caudate head/lenticular nucleus.  No CT evidence of large acute infarct.   Original Report Authenticated By: Fuller Canada, M.D.      Assessment/Plan:   78 YO male with recent CVA in 08/2011, presenting with new onset seizures (3).  Since arrival patient has been intubated and started on Keppra 1000 mg bolus followed by 500 mg BID, and propofol drip.  Recommend: 1) Continue Keppra 500 mg BID 2) Ok to start low dose Heparin  (no bolus) until therapeutic INR.  3) Continue ASA PR  4) MRI brain no contrast to R/O further infarcts 5) EEG tomorrow   We will continue to follow with you.    Felicie Morn PA-C [facility] [phone]  11/28/2011, 3:49 PM

## 2011-11-28 NOTE — H&P (Signed)
Name: Terry Robertson MRN: [id number] DOB: [birth year]    LOS: 0  Referring Provider:  EDP -  Reason for Referral:    PULMONARY / CRITICAL CARE MEDICINE  HPI:  78 y/o M with PMH of CAD, HTN, CVA, ? DM (on no meds) who was noted on 9/23 to be driving erratically, went home and EMS was notified and found him sitting in a chair unable to speak but awake with right sided gaze, right arm twitching.  In ED upon arrival, was noted to have seizure. Further decompensated and required intubation per EDP.  Concern for new CVA.  PCCM consulted for ICU admit.     Past Medical History  Diagnosis Date  . Coronary artery disease   . Diabetes mellitus   . Hypertension   . Stroke    Past Surgical History  Procedure Date  . Cardiac surgery    Prior to Admission medications   Medication Sig Start Date End Date Taking? Authorizing Provider  aspirin EC 81 MG tablet Take 81 mg by mouth daily.    Historical Provider, MD  atenolol (TENORMIN) 12.5 mg TABS Take 0.5 tablets (12.5 mg total) by mouth daily. 08/14/11   Wilburt Finlay, PA  diltiazem (CARDIZEM CD) 180 MG 24 hr capsule Take 1 capsule (180 mg total) by mouth daily. 08/14/11 08/13/12  Wilburt Finlay, PA  lisinopril (PRINIVIL,ZESTRIL) 10 MG tablet Take 1 tablet (10 mg total) by mouth daily. 08/14/11 08/13/12  Wilburt Finlay, PA  naproxen (NAPROSYN) 375 MG tablet Take 1 tablet (375 mg total) by mouth 2 (two) times daily. 08/22/11 08/21/12  Loren Racer, MD  nitroGLYCERIN (NITROSTAT) 0.4 MG SL tablet Place 1 tablet (0.4 mg total) under the tongue every 5 (five) minutes x 3 doses as needed for chest pain. 08/14/11 08/13/12  Wilburt Finlay, PA  omega-3 acid ethyl esters (LOVAZA) 1 G capsule Take 1 g by mouth 2 (two) times daily.    Historical Provider, MD  simvastatin (ZOCOR) 20 MG tablet Take 20 mg by mouth daily.    Historical Provider, MD  STUDY MEDICATION Take 2 capsules by mouth daily. 2 bottles of a Study Drug for gout takes 1 capsule from each bottle once daily    Historical  Provider, MD  terazosin (HYTRIN) 2 MG capsule Take 2 mg by mouth daily.    Historical Provider, MD  warfarin (COUMADIN) 2.5 MG tablet Take 5 mg by mouth daily. Sunday, Tuesday, Thursday, saturday    Historical Provider, MD  warfarin (COUMADIN) 2.5 MG tablet Take 7.5 mg by mouth daily. Monday, Wednesday, friday    Historical Provider, MD   Allergies No Known Allergies  Family History History reviewed. No pertinent family history. Social History  reports that he quit smoking about 23 years ago. His smoking use included Cigarettes. He does not have any smokeless tobacco history on file. He reports that he does not use illicit drugs. His alcohol history not on file.  Review Of Systems:  Unable to complete as patient is altered on vent.    Brief patient description:    Events Since Admission: 9/24 - Admit with seizure activity, concern for stroke, intubated  Current Status: intubated, sedated  Vital Signs: Pulse Rate:  [104-111] 107  (09/24 1300) Resp:  [19-31] 23  (09/24 1300) BP: (125-182)/(71-91) 182/76 mmHg (09/24 1300) SpO2:  [77 %-100 %] 85 % (09/24 1300) FiO2 (%):  [100 %] 100 % (09/24 1300)  Physical Examination: General:  Elderly male in NAD Neuro:  Sedated on vent HEENT:  OETT, mm pink/moist Cardiovascular:  s1s2 rrr, no m/r/g Lungs:  resp's even/non-labored on vent, breath sounds diminished bilaterally Abdomen:  Round/soft, bsx4 active Musculoskeletal:  No acute deformities Skin:  Warm/dry, no edema  Active Problems:  * No active hospital problems. *    ASSESSMENT AND PLAN  NEUROLOGIC  A:   Acute Seizure - concern for new CVA.  Initial 9/24 CT negative for acute CVA.   Altered Mental Status   P:   -propofol for sedation to allow for frequent neuro checks and BP control -Keppra load and dosing -Neurology Consult -EEG, further imaging per Neuro.      PULMONARY No results found for this basename: PHART:5,PCO2:5,PCO2ART:5,PO2ART:5,HCO3:5,O2SAT:5 in the  last 168 hours Ventilator Settings: Vent Mode:  [-] PRVC FiO2 (%):  [100 %] 100 % Set Rate:  [16 bmp] 16 bmp Vt Set:  [600 mL] 600 mL Plateau Pressure:  [17 cmH20] 17 cmH20 CXR:  9/23>>>  ETT:  9/23>>>  A:   Acute Respiratory Failure -in setting of neurological process  P:   -full vent support -f/u abg in one hour -f/u cxr to confirm placement -SBT in am -propofol for sedation to allow for neuro checks   CARDIOVASCULAR No results found for this basename: TROPONINI:5,LATICACIDVEN:5, O2SATVEN:5,PROBNP:5 in the last 168 hours ECG:   Lines:    A:  Hx of Afib - diagnosed in 08/2011 as new onset.   Hx of HTN Hx of HLD  P:  -hold coumadin -will hold heparin until seen by Neuro  -propofol -hold diltiazem, atenolol, lisinopril  -low dose metoprolol IV to avoid reflex tachy -hold zocor   RENAL No results found for this basename: NA:5,K:2,CL:5,CO2:5,BUN:5,CREATININE:5,CALCIUM:5,MG:5,PHOS:5 in the last 168 hours Intake/Output    None    Foley:  9/23  A:    P:   -now BMP  GASTROINTESTINAL No results found for this basename: AST:5,ALT:5,ALKPHOS:5,BILITOT:5,PROT:5,ALBUMIN:5 in the last 168 hours  A:   NPO  P:   -consider TF in am -PPI  HEMATOLOGIC No results found for this basename: HGB:5,HCT:5,PLT:5,INR:5,APTT:5 in the last 168 hours A:   Chronic Coumadin - in setting of Afib  P:  -now coags -hold coumadin / heparin for now until seen by Neuro -SCD's  INFECTIOUS No results found for this basename: WBC:5,PROCALCITON:5 in the last 168 hours Cultures: 9/23 Bcx2>>> 9/23 UA>>> 9/23 Sputum>>>  Antibiotics:   A:   No evidence of infection at this time.  Concerning for infection vs anniversary seizure.    P:   -hold abx -assess cultures, PCT -follow fever curve, leukocytosis  ENDOCRINE No results found for this basename: GLUCAP:5 in the last 168 hours A:   Hx of DM - questionable diagnosis as pt is not on any home medications for DM    P:     -ICU SSI    BEST PRACTICE / DISPOSITION Level of Care:  ICU Primary Service:  PCCM Consultants:  Neurology Code Status:  Full Code Diet:  NPO DVT Px:  SCD's  GI Px:  Protonix Skin Integrity:  intact Social / Family:  None available at this time.   Canary Brim, NP-C [facility] Pgr: [phone] or [phone]    11/28/2011, 1:45 PM  Will hold heparin for now, clear by neuro first prior to starting heparin.  Will maintain on vent with full support for now then treat seizure.  Admit to the ICU under PCCM.  CC time 60 min.  Patient seen and examined, agree with above note.  I  dictated the care and orders written for this patient under my direction.  [name], M.D. [phone]

## 2011-11-28 NOTE — ED Notes (Signed)
Placed temp. Foley into patient size 14 french clear yellow urine in return

## 2011-11-28 NOTE — ED Notes (Signed)
third attempt with 7.5 ET

## 2011-11-28 NOTE — ED Provider Notes (Signed)
History     CSN: [id number]  Arrival date & time 11/28/11  1213   First MD Initiated Contact with Patient 11/28/11 1430      Chief Complaint  Patient presents with  . Cerebrovascular Accident    Level V Caveat: Patient unresponsive   (Consider location/radiation/quality/duration/timing/severity/associated sxs/prior treatment) Patient is a 78 y.o. male presenting with Acute Neurological Problem. The history is provided by the EMS personnel. The history is limited by the condition of the patient.  Cerebrovascular Accident  Terry Robertson is a 78 y.o. male presenting via EMS unresponsive, then seizing with GCS 3.  Per family, pt had history of prior stroke.  Last seen normal early this morning 0700-0800. No other reliable history obtained per EMS.  Past Medical History  Diagnosis Date  . Coronary artery disease   . Diabetes mellitus   . Hypertension   . Stroke     Past Surgical History  Procedure Date  . Cardiac surgery     History reviewed. No pertinent family history.  History  Substance Use Topics  . Smoking status: Former Smoker    Types: Cigarettes    Quit date: 03/08/1988  . Smokeless tobacco: Not on file  . Alcohol Use:       Review of Systems cannot obtain ROS d/t patient mental status.  Allergies  Review of patient's allergies indicates no known allergies.  Home Medications  No current outpatient prescriptions on file.  BP 160/66  Pulse 59  Temp 99.9 F (37.7 C)  Resp 20  Ht 5\' 5"  (1.651 m)  Wt 250 lb 3.6 oz (113.5 kg)  BMI 41.64 kg/m2  SpO2 100%  Physical Exam  Nursing notes reviewed.  Electronic medical record reviewed. VITAL SIGNS:   Filed Vitals:   11/28/11 1703 11/28/11 1800 11/28/11 1900 11/28/11 2000  BP: 160/67 168/78 178/79 160/66  Pulse: 52 65 61 59  Temp: 98.6 F (37 C) 99.1 F (37.3 C) 99.5 F (37.5 C) 99.9 F (37.7 C)  TempSrc:    Other (Comment)  Resp: 18 22 20 20   Height:      Weight:      SpO2: 100% 100% 100% 100%     CONSTITUTIONAL: GCS 3, sonorous respirations with pooling secretions HENT: Atraumatic, normocephalic, oral mucosa pink and moist, Mallampati IV,  Contusion/bite to tongue with minimal tongue swelling and bleeding.  Nares patent without drainage. External ears normal. EYES: Conjunctiva clear, bilateral arcus senilis, rightward gaze - occasional roving eye movements L to R NECK: Trachea midline, non-tender, supple, short neck CARDIOVASCULAR: tachycardic rate, Normal rhythm, distant heart sounds, PULMONARY/CHEST: Clear to auscultation, no rhonchi, wheezes, or rales. Symmetric sounds. ABDOMINAL:Obese, non-distended, soft. NEUROLOGIC: Unresponsive, GCS 3. Pupils 2mm pinpoint and sluggish.  Plantar reflex mute, no clonus, mute patellar reflex bilaterally. EXTREMITIES: No clubbing, cyanosis, or edema SKIN: Warm, Dry, No erythema, No rash  ED Course  CRITICAL CARE Performed by: Jones Skene Authorized by: Jones Skene Total critical care time: 60 minutes Critical care was necessary to treat or prevent imminent or life-threatening deterioration of the following conditions: respiratory failure and CNS failure or compromise. Critical care was time spent personally by me on the following activities: discussions with consultants, development of treatment plan with patient or surrogate, examination of patient, evaluation of patient's response to treatment, obtaining history from patient or surrogate, ordering and performing treatments and interventions, ordering and review of radiographic studies, ordering and review of laboratory studies, pulse oximetry, review of old charts, ventilator management and re-evaluation of patient's  condition.  INTUBATION Performed by: Jones Skene Authorized by: Jones Skene Consent: The procedure was performed in an emergent situation. Indications: hypoxemia and respiratory failure Intubation method: video-assisted Patient status: paralyzed  (RSI) Preoxygenation: BVM Pretreatment medications: none Sedatives: etomidate Paralytic: succinylcholine Laryngoscope size: Mac 3 Tube size: 7.5 mm Tube type: cuffed Number of attempts: 3 Ventilation between attempts: BVM Cords visualized: yes Post-procedure assessment: chest rise and CO2 detector Breath sounds: equal Cuff inflated: yes ETT to teeth: 24 cm Tube secured with: ETT holder Chest x-ray interpreted by me and radiologist. Chest x-ray findings: endotracheal tube in appropriate position Patient tolerance: Patient tolerated the procedure well with no immediate complications.   (including critical care time)  Labs Reviewed  URINALYSIS, ROUTINE W REFLEX MICROSCOPIC - Abnormal; Notable for the following:    APPearance CLOUDY (*)     Hgb urine dipstick MODERATE (*)     Protein, ur 100 (*)     All other components within normal limits  COMPREHENSIVE METABOLIC PANEL - Abnormal; Notable for the following:    Glucose, Bld 146 (*)     Calcium 8.1 (*)     Albumin 3.1 (*)     GFR calc non Af Amer 79 (*)     All other components within normal limits  CBC WITH DIFFERENTIAL - Abnormal; Notable for the following:    RBC 4.10 (*)     Hemoglobin 11.0 (*)     HCT 34.8 (*)     Platelets 103 (*)  PLATELET COUNT CONFIRMED BY SMEAR   Monocytes Relative 24 (*)     Monocytes Absolute 1.3 (*)     All other components within normal limits  PROTIME-INR - Abnormal; Notable for the following:    Prothrombin Time 18.8 (*)     INR 1.63 (*)     All other components within normal limits  URINE MICROSCOPIC-ADD ON - Abnormal; Notable for the following:    Casts HYALINE CASTS (*)     All other components within normal limits  GLUCOSE, CAPILLARY - Abnormal; Notable for the following:    Glucose-Capillary 123 (*)     All other components within normal limits  TROPONIN I  MAGNESIUM  PROCALCITONIN  MRSA PCR SCREENING  GLUCOSE, CAPILLARY  CULTURE, BLOOD (ROUTINE X 2)  CULTURE, BLOOD (ROUTINE  X 2)  CULTURE, EXPECTORATED SPUTUM-ASSESSMENT  CULTURE, RESPIRATORY  CBC  BASIC METABOLIC PANEL  BLOOD GAS, ARTERIAL  MAGNESIUM  PHOSPHORUS   Ct Head Wo Contrast  11/28/2011  *RADIOLOGY REPORT*  Clinical Data: Difficulty speaking seizure.  History high blood pressure.  CT HEAD WITHOUT CONTRAST  Technique:  Contiguous axial images were obtained from the base of the skull through the vertex without contrast.  Comparison: 08/09/2011 CT and MR.  Findings: No intracranial hemorrhage.  Remote moderate sized left parietal lobe infarct extending to posterior opercular region.  This was noted to be an acute infarct on the 08/09/2011 examination.  Remote infarcts right caudate head/lenticular nucleus.  No CT evidence of large acute infarct.  Global atrophy without hydrocephalus.  Small vessel disease type changes.  No intracranial mass lesion detected on this unenhanced exam.  Remote right medial orbital wall fracture versus congenital defect.  IMPRESSION: No intracranial hemorrhage.  Remote moderate sized left parietal lobe infarct extending to posterior opercular region.  This was noted to be an acute infarct on the 08/09/2011 examination.  Remote infarcts right caudate head/lenticular nucleus.  No CT evidence of large acute infarct.   Original Report Authenticated By: Viviann Spare  R. Constance Goltz, M.D.    Dg Chest Portable 1 View  11/28/2011  *RADIOLOGY REPORT*  Clinical Data: CVA.  Post intubation.  PORTABLE CHEST - 1 VIEW  Comparison: 08/05/2011  Findings: Endotracheal tube is 2.5 cm above the carina.  Heart mildly enlarged.  Minimal bibasilar atelectasis.  Mild vascular congestion.  IMPRESSION: Endotracheal tube 2.5 cm above the carina.   Original Report Authenticated By: Cyndie Chime, M.D.      1. Acute respiratory failure   2. Altered mental status   3. Atrial fibrillation   4. DM type 2 (diabetes mellitus, type 2)   5. Hypotension   6. Morbid obesity   7. Seizure     MDM  Terry Robertson is a 78 y.o. male  with a PMH significant for vasculopathy including CAD and prior "small stroke" per family presents unresponsive and seizing.  Pt transferred to gurney, did stop seizing and received Ativan.  Nasal trumpet placed, NRB at 15LPM. History of having been seen normal this morning and physical exam suggested CVA vs. Intracranial bleed vs. Infection/sepsis or metabolic derangement. PT hemodynamically stable, hypertensive and slightly tachycardic.    Pt thought to be a difficult intubation, multiple back-up modalities including glidescope and bougie set up.  Pt required 3 attempts due to very challenging anatomy, ,each attempt he was re-oxygenated to 95% plus, he was passively oxygenated with 5LNC the entire time.  Intubation achieved with 7.5 ETT using Glidescope and bougie. Pt taken to CT.  CT images reveal old and subacute infarcts.  No intracranial hemorrhage. Will need MRI.  Labs ordered - fairly unremarkable, no acidosis, no leukocytosis, UA unremarkable.  ETT in good position - no infiltrate or pulmonary edema seen on XR.    Troponin normal.  PT likely with CVA, not TPA candidate.  Will require neuroICU care.  Updated family and answered questions - continue aggressive care, full code.  DW Intensivist for admission.         Jones Skene, MD 11/28/11 2051

## 2011-11-28 NOTE — Progress Notes (Signed)
When I arrived, pt's girlfriend and son were in lobby. Pt's girlfriend was very upset. Nurse informed me that dr wanted to speak w/family. Pt's girlfriend gathered sons and I took them to consult. Meanwhile another nurse arrived and updated pt's family. I escorted pt's family members to pt room for brief visits (2 at a time). As requested by friend of family, I had prayer w/family. They thanked me for my presence, prayer and support.  Marjory Lies,  Chaplain

## 2011-11-28 NOTE — ED Notes (Addendum)
PPer EMS patient on arrival patient sitting in chair unable to speak, awake, with severe right sided gaze, right leg and arm twitching

## 2011-11-29 ENCOUNTER — Inpatient Hospital Stay (HOSPITAL_COMMUNITY): Payer: Medicare Other

## 2011-11-29 ENCOUNTER — Other Ambulatory Visit (HOSPITAL_COMMUNITY): Payer: Medicare Other

## 2011-11-29 DIAGNOSIS — I6340 Cerebral infarction due to embolism of unspecified cerebral artery: Secondary | ICD-10-CM

## 2011-11-29 LAB — BLOOD GAS, ARTERIAL
Acid-base deficit: 2.1 mmol/L — ABNORMAL HIGH (ref 0.0–2.0)
Bicarbonate: 21.6 mEq/L (ref 20.0–24.0)
Drawn by: 34764
FIO2: 0.4 %
MECHVT: 600 mL
O2 Saturation: 96.7 %
PEEP: 5 cmH2O
Patient temperature: 98.6
RATE: 16 resp/min
TCO2: 22.6 mmol/L (ref 0–100)
pCO2 arterial: 33.3 mmHg — ABNORMAL LOW (ref 35.0–45.0)
pH, Arterial: 7.427 (ref 7.350–7.450)
pO2, Arterial: 87 mmHg (ref 80.0–100.0)

## 2011-11-29 LAB — GLUCOSE, CAPILLARY
Glucose-Capillary: 102 mg/dL — ABNORMAL HIGH (ref 70–99)
Glucose-Capillary: 89 mg/dL (ref 70–99)
Glucose-Capillary: 94 mg/dL (ref 70–99)

## 2011-11-29 LAB — CBC
Hemoglobin: 11 g/dL — ABNORMAL LOW (ref 13.0–17.0)
Hemoglobin: 11 g/dL — ABNORMAL LOW (ref 13.0–17.0)
MCH: 26.5 pg (ref 26.0–34.0)
MCHC: 32 g/dL (ref 30.0–36.0)
MCHC: 31.6 g/dL (ref 30.0–36.0)
MCV: 83.9 fL (ref 78.0–100.0)
RBC: 4.15 MIL/uL — ABNORMAL LOW (ref 4.22–5.81)
RDW: 14.8 % (ref 11.5–15.5)
WBC: 5.1 10*3/uL (ref 4.0–10.5)

## 2011-11-29 LAB — POCT I-STAT 3, ART BLOOD GAS (G3+)
Acid-base deficit: 5 mmol/L — ABNORMAL HIGH (ref 0.0–2.0)
Bicarbonate: 20.2 mEq/L (ref 20.0–24.0)
O2 Saturation: 98 %
Patient temperature: 37
pO2, Arterial: 119 mmHg — ABNORMAL HIGH (ref 80.0–100.0)

## 2011-11-29 LAB — BASIC METABOLIC PANEL
Chloride: 110 mEq/L (ref 96–112)
GFR calc Af Amer: >90 mL/min (ref >90)
GFR calc non Af Amer: 80 mL/min — ABNORMAL LOW (ref >90)
Glucose, Bld: 93 mg/dL (ref 70–99)
Potassium: 3.2 mEq/L — ABNORMAL LOW (ref 3.5–5.1)
Sodium: 140 mEq/L (ref 135–145)

## 2011-11-29 MED ORDER — HEPARIN (PORCINE) IN NACL 100-0.45 UNIT/ML-% IJ SOLN
1500.0000 [IU]/h | INTRAMUSCULAR | Status: DC
  Administered 2011-11-30: 1400 [IU]/h via INTRAVENOUS
  Administered 2011-12-01: 1500 [IU]/h via INTRAVENOUS
  Filled 2011-11-29 (×8): qty 250

## 2011-11-29 MED ORDER — INFLUENZA VIRUS VACC SPLIT PF IM SUSP
0.5000 mL | INTRAMUSCULAR | Status: AC
  Administered 2011-11-30: 0.5 mL via INTRAMUSCULAR
  Filled 2011-11-29: qty 0.5

## 2011-11-29 MED ORDER — POTASSIUM PHOSPHATE DIBASIC 3 MMOLE/ML IV SOLN
30.0000 mmol | Freq: Once | INTRAVENOUS | Status: AC
  Administered 2011-11-29: 30 mmol via INTRAVENOUS
  Filled 2011-11-29: qty 10

## 2011-11-29 MED ORDER — HEPARIN (PORCINE) IN NACL 100-0.45 UNIT/ML-% IJ SOLN
900.0000 [IU]/h | INTRAMUSCULAR | Status: DC
  Administered 2011-11-29: 900 [IU]/h via INTRAVENOUS
  Filled 2011-11-29: qty 250

## 2011-11-29 MED ORDER — PNEUMOCOCCAL VAC POLYVALENT 25 MCG/0.5ML IJ INJ
0.5000 mL | INJECTION | INTRAMUSCULAR | Status: AC
  Administered 2011-11-30: 0.5 mL via INTRAMUSCULAR
  Filled 2011-11-29: qty 0.5

## 2011-11-29 MED ORDER — METOPROLOL TARTRATE 25 MG/10 ML ORAL SUSPENSION
12.5000 mg | Freq: Two times a day (BID) | ORAL | Status: DC
  Administered 2011-11-29 – 2011-12-04 (×9): 12.5 mg
  Filled 2011-11-29 (×12): qty 5

## 2011-11-29 MED ORDER — ACETAMINOPHEN 160 MG/5ML PO SOLN
650.0000 mg | Freq: Once | ORAL | Status: AC
  Administered 2011-11-29: 650 mg
  Filled 2011-11-29: qty 20.3

## 2011-11-29 NOTE — H&P (Signed)
Name: Terry Robertson MRN: [id number] DOB: 08-Aug-1928    LOS: 1  Referring Provider:  EDP -  Reason for Referral:    PULMONARY / CRITICAL CARE MEDICINE  HPI:  78 y/o M with PMH of CAD, HTN, CVA, ? DM (on no meds) who was noted on 9/23 to be driving erratically, went home and EMS was notified and found him sitting in a chair unable to speak but awake with right sided gaze, right arm twitching.  In ED upon arrival, was noted to have seizure. Further decompensated and required intubation per EDP.  Concern for new CVA.  PCCM consulted for ICU admit.     Vital Signs: Temp:  [98.1 F (36.7 C)-100.9 F (38.3 C)] 100.4 F (38 C) (09/25 0800) Pulse Rate:  [52-111] 73  (09/25 0800) Resp:  [16-31] 17  (09/25 0800) BP: (91-182)/(47-124) 136/54 mmHg (09/25 0800) SpO2:  [77 %-100 %] 99 % (09/25 0800) FiO2 (%):  [40 %-100 %] 40 % (09/25 0825) Weight:  [108 kg (238 lb 1.6 oz)-114.2 kg (251 lb 12.3 oz)] 114.2 kg (251 lb 12.3 oz) (09/25 0459)  Physical Examination: General:  Elderly male in NAD Neuro:  Sedated on vent HEENT:  OETT, mm pink/moist Cardiovascular:  s1s2 rrr, no m/r/g Lungs:  resp's even/non-labored on vent, breath sounds diminished bilaterally Abdomen:  Round/soft, bsx4 active Musculoskeletal:  No acute deformities Skin:  Warm/dry, no edema  Active Problems:  Acute respiratory failure  Altered mental status  Seizure  Status epilepticus, generalized convulsive   ASSESSMENT AND PLAN  NEUROLOGIC  A:   Acute Seizure - concern for new CVA.  Initial 9/24 CT negative for acute CVA.   Altered Mental Status   P:   -Propofol for sedation to allow for frequent neuro checks and BP control. -Keppra load and dosing per neuro's recommendations. -Neurology Consult appreciated. -EEG, further imaging per Neuro.   -Heparin drip and transition to coumadin in the next 48 hours. -MRI pending, to be done at 11:30 today.  PULMONARY  Lab 11/29/11 0402  PHART 7.427  PCO2ART 33.3*  PO2ART  87.0  HCO3 21.6  O2SAT 96.7   Ventilator Settings: Vent Mode:  [-] PSV FiO2 (%):  [40 %-100 %] 40 % Set Rate:  [16 bmp] 16 bmp Vt Set:  [600 mL] 600 mL PEEP:  [5 cmH20] 5 cmH20 Plateau Pressure:  [17 cmH20-21 cmH20] 19 cmH20 CXR:  9/23>>>  ETT:  9/23>>>  A:   Acute Respiratory Failure -in setting of neurological process  P:   -Begin PS trials today, if stable post MRI will consider extubation assuming that the upper airway is normal (was a very very traumatic intubation). -SBT today if stable, if not then in AM. -Propofol for sedation to allow for neuro checks.  CARDIOVASCULAR  Lab 11/28/11 1441  TROPONINI <0.30  LATICACIDVEN --  PROBNP --   ECG:   Lines:    A:  Hx of Afib - diagnosed in 08/2011 as new onset.   Hx of HTN Hx of HLD  P:  -Coumadin 48 hours after heparin is in place. -Full dose heparin. -Propofol but hold later for weaning. -Hold diltiazem, low dose metoprolol and hold lisinopril. -D/C IV metoprolol. -Hold zocor. -KVO IVF.  RENAL  Lab 11/29/11 0455 11/28/11 1440  NA 140 137  K 3.2* 4.1  CL 110 104  CO2 21 19  BUN 9 11  CREATININE 0.84 0.87  CALCIUM 8.6 8.1*  MG 2.1 2.1  PHOS 1.9* --   Intake/Output  09/24 0701 - 09/25 0700 09/25 0701 - 09/26 0700   I.V. (mL/kg) 687.3 (6) 278.6 (2.4)   IV Piggyback 220    Total Intake(mL/kg) 907.3 (7.9) 278.6 (2.4)   Urine (mL/kg/hr) 1310 (0.5)    Total Output 1310    Net -402.7 +278.6         Foley:  9/23  A:  Hypokalemia and phosphatemia.  P:   -Replace K3PO4. -Recheck.  GASTROINTESTINAL  Lab 11/28/11 1440  AST 19  ALT 9  ALKPHOS 44  BILITOT 0.4  PROT 8.3  ALBUMIN 3.1*    A:   NPO  P:   -Hold TF since we will likely be able to extubate today or early tomorrow at the latest. -PPI  HEMATOLOGIC  Lab 11/29/11 0455 11/28/11 1441 11/28/11 1440  HGB 11.0* -- 11.0*  HCT 34.4* -- 34.8*  PLT 88* -- 103*  INR -- 1.63* --  APTT -- -- --   A:   Chronic Coumadin - in setting  of Afib  P:  -Anticoagulation as discussed above.  INFECTIOUS  Lab 11/29/11 0455 11/28/11 1441 11/28/11 1440  WBC 5.1 -- 5.6  PROCALCITON -- <0.10 --   Cultures: 9/23 Bcx2>>> 9/23 UA>>> 9/23 Sputum>>>  Antibiotics: None  A:   No evidence of infection at this time.  Concerning for infection vs anniversary seizure.    P:   -Hold abx -Assess cultures -Follow fever curve, leukocytosis  ENDOCRINE  Lab 11/29/11 0346 11/28/11 2318 11/28/11 1954 11/28/11 1544  GLUCAP 89 86 78 123*   A:   Hx of DM - questionable diagnosis as pt is not on any home medications for DM    P:   -ICU SSI  CC time 35 min.  Alyson Reedy, M.D. [facility] Pulmonary/Critical Care Medicine. Pager: [phone]. After hours pager: [phone].

## 2011-11-29 NOTE — Progress Notes (Signed)
Heparin Per Pharmacy Indication: A.Fib   Heparin level = 0.12 on 900 units/hr Goal heparin level = 0.3-0.5  Heparin level is subtherapeutic. Will increase rate to 1100 units/hr and check heparin level and CBC with morning labs.  Cardell Peach, PharmD

## 2011-11-29 NOTE — Progress Notes (Signed)
Saw pt's family in lobby and visited w/them for and they told me that he was doing well. Saw pt. He acknowledged my presence and shook his head. Had prayer w/pt. Marjory Lies,  Chaplain

## 2011-11-29 NOTE — Progress Notes (Signed)
ANTICOAGULATION CONSULT NOTE - Initial Consult  Pharmacy Consult for heparin Indication: atrial fibrillation  No Known Allergies  Patient Measurements: Height: 5\' 5"  (165.1 cm) Weight: 251 lb 12.3 oz (114.2 kg) IBW/kg (Calculated) : 61.5  Heparin Dosing Weight: 79 kg  Vital Signs: Temp: 100.6 F (38.1 C) (09/25 1000) BP: 132/65 mmHg (09/25 1000) Pulse Rate: 81  (09/25 1000)  Labs:  Basename 11/29/11 0455 11/28/11 1441 11/28/11 1440  HGB 11.0* -- 11.0*  HCT 34.4* -- 34.8*  PLT 88* -- 103*  APTT -- -- --  LABPROT -- 18.8* --  INR -- 1.63* --  HEPARINUNFRC -- -- --  CREATININE 0.84 -- 0.87  CKTOTAL -- -- --  CKMB -- -- --  TROPONINI -- <0.30 --    Estimated Creatinine Clearance: 80.6 ml/min (by C-G formula based on Cr of 0.84).   Medical History: Past Medical History  Diagnosis Date  . Coronary artery disease   . Diabetes mellitus   . Hypertension   . Stroke     Medications:  Prescriptions prior to admission  Medication Sig Dispense Refill  . aspirin EC 81 MG tablet Take 81 mg by mouth daily.      Marland Kitchen atenolol (TENORMIN) 12.5 mg TABS Take 0.5 tablets (12.5 mg total) by mouth daily.  303 tablet  5  . diltiazem (CARDIZEM CD) 180 MG 24 hr capsule Take 1 capsule (180 mg total) by mouth daily.  30 capsule  5  . lisinopril (PRINIVIL,ZESTRIL) 10 MG tablet Take 1 tablet (10 mg total) by mouth daily.  303 tablet  5  . naproxen (NAPROSYN) 375 MG tablet Take 1 tablet (375 mg total) by mouth 2 (two) times daily.  20 tablet  0  . nitroGLYCERIN (NITROSTAT) 0.4 MG SL tablet Place 1 tablet (0.4 mg total) under the tongue every 5 (five) minutes x 3 doses as needed for chest pain.  25 tablet  5  . omega-3 acid ethyl esters (LOVAZA) 1 G capsule Take 1 g by mouth 2 (two) times daily.      . simvastatin (ZOCOR) 20 MG tablet Take 20 mg by mouth daily.      . STUDY MEDICATION Take 2 capsules by mouth daily. 2 bottles of a Study Drug for gout takes 1 capsule from each bottle once daily       . terazosin (HYTRIN) 2 MG capsule Take 2 mg by mouth daily.      Marland Kitchen warfarin (COUMADIN) 2.5 MG tablet Take 5 mg by mouth daily. Sunday, Tuesday, Thursday, saturday      . warfarin (COUMADIN) 2.5 MG tablet Take 7.5 mg by mouth daily. Monday, Wednesday, friday        Assessment: 78 yo man on coumadin PTA for afib.  He was admitted with seizure and ? New CVA.He required intubation in the ED.  CT was negative for bleed and acute CVA.  MRI pending. Goal of Therapy:  Heparin level 0.3-0.5 units/ml Monitor platelets by anticoagulation protocol: Yes   Plan:  Heparin drip start at 900 unit/hr.  No bolus. Check heparin level 8 hours after start. Check daily heparin level and CBC. Monitor for s&s bleeding, f/u MRI results.  Lesieli Bresee Poteet 11/29/2011,10:43 AM

## 2011-11-29 NOTE — Progress Notes (Addendum)
Subjective:  Location ICU Patient is intubated On low dose of Fentanyl and Propofol  Patient with right sided weakness from previous stroke. On coumadin for Afib. Comes with change in mental status and worsening of right sided weakness. Seizure was suspected and AED has been continued Patient is awake, alert and follows commands.    No complications overnight This morning patient has been planned to be extubated. No seizures since admission Objective: Current vital signs: BP 151/70  Pulse 89  Temp 100.4 F (38 C) (Other (Comment))  Resp 21  Ht 5\' 5"  (1.651 m)  Wt 114.2 kg (251 lb 12.3 oz)  BMI 41.90 kg/m2  SpO2 100% Vital signs in last 24 hours: Temp:  [98.1 F (36.7 C)-100.9 F (38.3 C)] 100.4 F (38 C) (09/25 0900) Pulse Rate:  [52-111] 89  (09/25 0900) Resp:  [16-31] 21  (09/25 0900) BP: (91-182)/(47-124) 151/70 mmHg (09/25 0900) SpO2:  [77 %-100 %] 100 % (09/25 0900) FiO2 (%):  [40 %-100 %] 40 % (09/25 0900) Weight:  [108 kg (238 lb 1.6 oz)-114.2 kg (251 lb 12.3 oz)] 114.2 kg (251 lb 12.3 oz) (09/25 0459)  Intake/Output from previous day: 09/24 0701 - 09/25 0700 In: 907.3 [I.V.:687.3; IV Piggyback:220] Out: 1310 [Urine:1310] Intake/Output this shift: Total I/O In: 328.6 [I.V.:328.6] Out: -  Nutritional status: NPO   Physical Exam: General: AA Follows commands  Lungs; CTA Heart; RRR Abdomen: Bowel sounds present  Neurologic Exam: AA follows commands  Pupils reactive Corneal reactive GAG intact No facial asymmetry  Able to move all the 4 extremities,.  But right sided weaker than the left Also sensory is diminished on the right compared to the left  No extrapyramidal  Lab Results: Results for orders placed during the hospital encounter of 11/28/11 (from the past 48 hour(s))  URINALYSIS, ROUTINE W REFLEX MICROSCOPIC     Status: Abnormal   Collection Time   11/28/11  1:30 PM      Component Value Range Comment   Color, Urine YELLOW  YELLOW    APPearance CLOUDY (*) CLEAR    Specific Gravity, Urine 1.011  1.005 - 1.030    pH 5.0  5.0 - 8.0    Glucose, UA NEGATIVE  NEGATIVE mg/dL    Hgb urine dipstick MODERATE (*) NEGATIVE    Bilirubin Urine NEGATIVE  NEGATIVE    Ketones, ur NEGATIVE  NEGATIVE mg/dL    Protein, ur 409 (*) NEGATIVE mg/dL    Urobilinogen, UA 0.2  0.0 - 1.0 mg/dL    Nitrite NEGATIVE  NEGATIVE    Leukocytes, UA NEGATIVE  NEGATIVE   URINE MICROSCOPIC-ADD ON     Status: Abnormal   Collection Time   11/28/11  1:30 PM      Component Value Range Comment   Squamous Epithelial / LPF RARE  RARE    WBC, UA 0-2  <3 WBC/hpf    RBC / HPF 0-2  <3 RBC/hpf    Bacteria, UA RARE  RARE    Casts HYALINE CASTS (*) NEGATIVE    Urine-Other AMORPHOUS URATES/PHOSPHATES     COMPREHENSIVE METABOLIC PANEL     Status: Abnormal   Collection Time   11/28/11  2:40 PM      Component Value Range Comment   Sodium 137  135 - 145 mEq/L    Potassium 4.1  3.5 - 5.1 mEq/L    Chloride 104  96 - 112 mEq/L    CO2 19  19 - 32 mEq/L    Glucose, Bld  146 (*) 70 - 99 mg/dL    BUN 11  6 - 23 mg/dL    Creatinine, Ser 1.61  0.50 - 1.35 mg/dL    Calcium 8.1 (*) 8.4 - 10.5 mg/dL    Total Protein 8.3  6.0 - 8.3 g/dL    Albumin 3.1 (*) 3.5 - 5.2 g/dL    AST 19  0 - 37 U/L    ALT 9  0 - 53 U/L    Alkaline Phosphatase 44  39 - 117 U/L    Total Bilirubin 0.4  0.3 - 1.2 mg/dL    GFR calc non Af Amer 79 (*) >90 mL/min    GFR calc Af Amer >90  >90 mL/min   CBC WITH DIFFERENTIAL     Status: Abnormal   Collection Time   11/28/11  2:40 PM      Component Value Range Comment   WBC 5.6  4.0 - 10.5 K/uL    RBC 4.10 (*) 4.22 - 5.81 MIL/uL    Hemoglobin 11.0 (*) 13.0 - 17.0 g/dL    HCT 09.6 (*) 04.5 - 52.0 %    MCV 84.9  78.0 - 100.0 fL    MCH 26.8  26.0 - 34.0 pg    MCHC 31.6  30.0 - 36.0 g/dL    RDW 40.9  81.1 - 91.4 %    Platelets 103 (*) 150 - 400 K/uL PLATELET COUNT CONFIRMED BY SMEAR   Neutrophils Relative 61  43 - 77 %    Lymphocytes Relative 15  12 - 46  %    Monocytes Relative 24 (*) 3 - 12 %    Eosinophils Relative 0  0 - 5 %    Basophils Relative 0  0 - 1 %    Neutro Abs 3.5  1.7 - 7.7 K/uL    Lymphs Abs 0.8  0.7 - 4.0 K/uL    Monocytes Absolute 1.3 (*) 0.1 - 1.0 K/uL    Eosinophils Absolute 0.0  0.0 - 0.7 K/uL    Basophils Absolute 0.0  0.0 - 0.1 K/uL    Smear Review LARGE PLATELETS PRESENT     MAGNESIUM     Status: Normal   Collection Time   11/28/11  2:40 PM      Component Value Range Comment   Magnesium 2.1  1.5 - 2.5 mg/dL   TROPONIN I     Status: Normal   Collection Time   11/28/11  2:41 PM      Component Value Range Comment   Troponin I <0.30  <0.30 ng/mL   PROTIME-INR     Status: Abnormal   Collection Time   11/28/11  2:41 PM      Component Value Range Comment   Prothrombin Time 18.8 (*) 11.6 - 15.2 seconds    INR 1.63 (*) 0.00 - 1.49   PROCALCITONIN     Status: Normal   Collection Time   11/28/11  2:41 PM      Component Value Range Comment   Procalcitonin <0.10     CULTURE, RESPIRATORY     Status: Normal (Preliminary result)   Collection Time   11/28/11  3:00 PM      Component Value Range Comment   Specimen Description ENDOTRACHEAL      Special Requests NONE      Gram Stain        Value: RARE WBC PRESENT, PREDOMINANTLY PMN     RARE SQUAMOUS EPITHELIAL CELLS PRESENT     NO  ORGANISMS SEEN   Culture Non-Pathogenic Oropharyngeal-type Flora Isolated.      Report Status PENDING     GLUCOSE, CAPILLARY     Status: Abnormal   Collection Time   11/28/11  3:44 PM      Component Value Range Comment   Glucose-Capillary 123 (*) 70 - 99 mg/dL    Comment 1 Notify RN      Comment 2 Documented in Chart     MRSA PCR SCREENING     Status: Normal   Collection Time   11/28/11  3:45 PM      Component Value Range Comment   MRSA by PCR NEGATIVE  NEGATIVE   GLUCOSE, CAPILLARY     Status: Normal   Collection Time   11/28/11  7:54 PM      Component Value Range Comment   Glucose-Capillary 78  70 - 99 mg/dL    Comment 1 Notify RN       Comment 2 Documented in Chart     GLUCOSE, CAPILLARY     Status: Normal   Collection Time   11/28/11 11:18 PM      Component Value Range Comment   Glucose-Capillary 86  70 - 99 mg/dL   GLUCOSE, CAPILLARY     Status: Normal   Collection Time   11/29/11  3:46 AM      Component Value Range Comment   Glucose-Capillary 89  70 - 99 mg/dL   BLOOD GAS, ARTERIAL     Status: Abnormal   Collection Time   11/29/11  4:02 AM      Component Value Range Comment   FIO2 0.40      Delivery systems VENTILATOR      Mode PRESSURE REGULATED VOLUME CONTROL      VT 600      Rate 16      Peep/cpap 5.0      pH, Arterial 7.427  7.350 - 7.450    pCO2 arterial 33.3 (*) 35.0 - 45.0 mmHg    pO2, Arterial 87.0  80.0 - 100.0 mmHg    Bicarbonate 21.6  20.0 - 24.0 mEq/L    TCO2 22.6  0 - 100 mmol/L    Acid-base deficit 2.1 (*) 0.0 - 2.0 mmol/L    O2 Saturation 96.7      Patient temperature 98.6      Collection site RIGHT RADIAL      Drawn by [phone]      Sample type ARTERIAL DRAW      Allens test (pass/fail) PASS  PASS   CBC     Status: Abnormal   Collection Time   11/29/11  4:55 AM      Component Value Range Comment   WBC 5.1  4.0 - 10.5 K/uL    RBC 4.11 (*) 4.22 - 5.81 MIL/uL    Hemoglobin 11.0 (*) 13.0 - 17.0 g/dL    HCT 60.4 (*) 54.0 - 52.0 %    MCV 83.7  78.0 - 100.0 fL    MCH 26.8  26.0 - 34.0 pg    MCHC 32.0  30.0 - 36.0 g/dL    RDW 98.1  19.1 - 47.8 %    Platelets 88 (*) 150 - 400 K/uL CONSISTENT WITH PREVIOUS RESULT  BASIC METABOLIC PANEL     Status: Abnormal   Collection Time   11/29/11  4:55 AM      Component Value Range Comment   Sodium 140  135 - 145 mEq/L    Potassium 3.2 (*)  3.5 - 5.1 mEq/L DELTA CHECK NOTED   Chloride 110  96 - 112 mEq/L    CO2 21  19 - 32 mEq/L    Glucose, Bld 93  70 - 99 mg/dL    BUN 9  6 - 23 mg/dL    Creatinine, Ser 1.61  0.50 - 1.35 mg/dL    Calcium 8.6  8.4 - 09.6 mg/dL    GFR calc non Af Amer 80 (*) >90 mL/min    GFR calc Af Amer >90  >90 mL/min   MAGNESIUM      Status: Normal   Collection Time   11/29/11  4:55 AM      Component Value Range Comment   Magnesium 2.1  1.5 - 2.5 mg/dL   PHOSPHORUS     Status: Abnormal   Collection Time   11/29/11  4:55 AM      Component Value Range Comment   Phosphorus 1.9 (*) 2.3 - 4.6 mg/dL   GLUCOSE, CAPILLARY     Status: Normal   Collection Time   11/29/11  7:56 AM      Component Value Range Comment   Glucose-Capillary 83  70 - 99 mg/dL     Recent Results (from the past 240 hour(s))  CULTURE, RESPIRATORY     Status: Normal (Preliminary result)   Collection Time   11/28/11  3:00 PM      Component Value Range Status Comment   Specimen Description ENDOTRACHEAL   Final    Special Requests NONE   Final    Gram Stain     Final    Value: RARE WBC PRESENT, PREDOMINANTLY PMN     RARE SQUAMOUS EPITHELIAL CELLS PRESENT     NO ORGANISMS SEEN   Culture Non-Pathogenic Oropharyngeal-type Flora Isolated.   Final    Report Status PENDING   Incomplete   MRSA PCR SCREENING     Status: Normal   Collection Time   11/28/11  3:45 PM      Component Value Range Status Comment   MRSA by PCR NEGATIVE  NEGATIVE Final     Lipid Panel No results found for this basename: CHOL,TRIG,HDL,CHOLHDL,VLDL,LDLCALC in the last 72 hours  Studies/Results: Ct Head Wo Contrast  11/28/2011  *RADIOLOGY REPORT*  Clinical Data: Difficulty speaking seizure.  History high blood pressure.  CT HEAD WITHOUT CONTRAST  Technique:  Contiguous axial images were obtained from the base of the skull through the vertex without contrast.  Comparison: 08/09/2011 CT and MR.  Findings: No intracranial hemorrhage.  Remote moderate sized left parietal lobe infarct extending to posterior opercular region.  This was noted to be an acute infarct on the 08/09/2011 examination.  Remote infarcts right caudate head/lenticular nucleus.  No CT evidence of large acute infarct.  Global atrophy without hydrocephalus.  Small vessel disease type changes.  No intracranial mass  lesion detected on this unenhanced exam.  Remote right medial orbital wall fracture versus congenital defect.  IMPRESSION: No intracranial hemorrhage.  Remote moderate sized left parietal lobe infarct extending to posterior opercular region.  This was noted to be an acute infarct on the 08/09/2011 examination.  Remote infarcts right caudate head/lenticular nucleus.  No CT evidence of large acute infarct.   Original Report Authenticated By: Fuller Canada, M.D.    Portable Chest Xray In Am  11/29/2011  *RADIOLOGY REPORT*  Clinical Data: Evaluate for infiltrate.  Assess endotracheal tube.  PORTABLE CHEST - 1 VIEW  Comparison: 11/28/2011  Findings: Cardiomegaly.  Central vascular congestion.  Bibasilar opacities.  Hypoaeration.  Endotracheal tube tip 2.7 cm proximal to the carina.  NG tube descends below the level of the image.  No pneumothorax.  Small effusions not excluded.  No interval osseous change.  IMPRESSION: Hypoaeration results in interstitial and vascular crowding. Allowing for this, there is suggestion of mild cardiomegaly and edema.  Bibasilar opacities; atelectasis versus infiltrate.  Unchanged support devices as above.   Original Report Authenticated By: Waneta Martins, M.D.    Dg Chest Portable 1 View  11/28/2011  *RADIOLOGY REPORT*  Clinical Data: CVA.  Post intubation.  PORTABLE CHEST - 1 VIEW  Comparison: 08/05/2011  Findings: Endotracheal tube is 2.5 cm above the carina.  Heart mildly enlarged.  Minimal bibasilar atelectasis.  Mild vascular congestion.  IMPRESSION: Endotracheal tube 2.5 cm above the carina.   Original Report Authenticated By: Cyndie Chime, M.D.     Medications:  Scheduled:   . antiseptic oral rinse  15 mL Mouth Rinse QID  . aspirin  324 mg Oral NOW   Or  . aspirin  300 mg Rectal NOW  . chlorhexidine  15 mL Mouth Rinse BID  . chlorhexidine      . etomidate      . etomidate      . fentaNYL      . insulin aspart  2-6 Units Subcutaneous Q4H  . levetiracetam   1,000 mg Intravenous Once  . levetiracetam  500 mg Intravenous Q12H  . lidocaine (cardiac) 100 mg/29ml      . lidocaine (cardiac) 100 mg/9ml      . LORazepam      . metoprolol tartrate  12.5 mg Per Tube BID  . midazolam      . pantoprazole (PROTONIX) IV  40 mg Intravenous QHS  . potassium phosphate IVPB (mmol)  30 mmol Intravenous Once  . rocuronium      . rocuronium      . succinylcholine      . succinylcholine      . DISCONTD: aspirin  150 mg Rectal Daily  . DISCONTD: carvedilol  3.125 mg Oral BID WC  . DISCONTD: diltiazem  30 mg Oral Q6H  . DISCONTD: levetiracetam  500 mg Intravenous Q12H  . DISCONTD: levetiracetam  500 mg Intravenous Q12H  . DISCONTD: metoprolol  5 mg Intravenous Q6H  . DISCONTD: metoprolol  5 mg Intravenous Q6H    Assessment/Plan:  Patient with Afib and on Coumadin.  Comes with worsening of right sided weakness.  MRI pending to rule out if there was extension of  Stroke.   Patient was started on AED for seizures. Been stable C/W Seizure medications MRI of the brain    Spent approximately 30 minutes of critical care time, reviewing charts, medications, labs and images  Shaye Elling V-P Eilleen Kempf., MD., Ph.D.,MS 11/29/2011 9:52 AM

## 2011-11-29 NOTE — Progress Notes (Signed)
INITIAL ADULT NUTRITION ASSESSMENT Date: 11/29/2011   Time: 10:53 AM Reason for Assessment: Vent x 1 day  ASSESSMENT: Male 78 y.o.  Dx: Acute respiratory failure; AMS; Seizure  Past Medical History  Diagnosis Date  . Coronary artery disease   . Diabetes mellitus   . Hypertension   . Stroke     Scheduled Meds:    . antiseptic oral rinse  15 mL Mouth Rinse QID  . aspirin  324 mg Oral NOW   Or  . aspirin  300 mg Rectal NOW  . chlorhexidine  15 mL Mouth Rinse BID  . chlorhexidine      . etomidate      . etomidate      . fentaNYL      . insulin aspart  2-6 Units Subcutaneous Q4H  . levetiracetam  1,000 mg Intravenous Once  . levetiracetam  500 mg Intravenous Q12H  . lidocaine (cardiac) 100 mg/6ml      . lidocaine (cardiac) 100 mg/80ml      . LORazepam      . metoprolol tartrate  12.5 mg Per Tube BID  . midazolam      . pantoprazole (PROTONIX) IV  40 mg Intravenous QHS  . potassium phosphate IVPB (mmol)  30 mmol Intravenous Once  . rocuronium      . rocuronium      . succinylcholine      . succinylcholine      . DISCONTD: aspirin  150 mg Rectal Daily  . DISCONTD: carvedilol  3.125 mg Oral BID WC  . DISCONTD: diltiazem  30 mg Oral Q6H  . DISCONTD: levetiracetam  500 mg Intravenous Q12H  . DISCONTD: levetiracetam  500 mg Intravenous Q12H  . DISCONTD: metoprolol  5 mg Intravenous Q6H  . DISCONTD: metoprolol  5 mg Intravenous Q6H   Continuous Infusions:    . fentaNYL infusion INTRAVENOUS 25 mcg/hr (11/29/11 1000)  . heparin    . propofol 20 mcg/kg/min (11/29/11 1000)  . DISCONTD: sodium chloride Stopped (11/29/11 0900)  . DISCONTD: propofol 20 mcg/kg/min (11/28/11 1438)   PRN Meds:.sodium chloride, fentaNYL   Ht: 5\' 5"  (165.1 cm)  Wt: 251 lb 12.3 oz (114.2 kg)  Ideal Wt:  61.8 kg % Ideal Wt: 185%  Usual Wt: 107-117 kg % Usual Wt: 97-106% Wt Readings from Last 10 Encounters:  11/29/11 251 lb 12.3 oz (114.2 kg)  08/14/11 236 lb 15.9 oz (107.5 kg)    06/14/11 259 lb 14.4 oz (117.89 kg)   Body mass index is 41.90 kg/(m^2).  Food/Nutrition Related Hx:  Patient is currently intubated on ventilator support (11/28/11). Per progress note, hold on TF due to extubation likely today or tomorrow. MV: 8.3 Temp:Temp (24hrs), Avg:99.7 F (37.6 C), Min:98.1 F (36.7 C), Max:100.9 F (38.3 C) Propofol: 13 ml/hr, which provides 343 kcal/ 24 hours   Labs:  CMP     Component Value Date/Time   NA 140 11/29/2011 0455   K 3.2* 11/29/2011 0455   CL 110 11/29/2011 0455   CO2 21 11/29/2011 0455   GLUCOSE 93 11/29/2011 0455   BUN 9 11/29/2011 0455   CREATININE 0.84 11/29/2011 0455   CALCIUM 8.6 11/29/2011 0455   PROT 8.3 11/28/2011 1440   ALBUMIN 3.1* 11/28/2011 1440   AST 19 11/28/2011 1440   ALT 9 11/28/2011 1440   ALKPHOS 44 11/28/2011 1440   BILITOT 0.4 11/28/2011 1440   GFRNONAA 80* 11/29/2011 0455   GFRAA >90 11/29/2011 0455    CBG (last 3)  Basename 11/29/11 0756 11/29/11 0346 11/28/11 2318  GLUCAP 83 89 86    Lab Results  Component Value Date   HGBA1C 5.9* 08/09/2011   HGBA1C 6.3* 08/05/2011   Lab Results  Component Value Date   LDLCALC 52 08/10/2011   CREATININE 0.84 11/29/2011    Intake/Output Summary (Last 24 hours) at 11/29/11 1053 Last data filed at 11/29/11 1022  Gross per 24 hour  Intake 1357.03 ml  Output   1670 ml  Net -312.97 ml     Diet Order: NPO  Supplements/Tube Feeding: None at this time  IVF:     fentaNYL infusion INTRAVENOUS Last Rate: 25 mcg/hr (11/29/11 1000)  heparin   propofol Last Rate: 20 mcg/kg/min (11/29/11 1000)  DISCONTD: sodium chloride Last Rate: Stopped (11/29/11 0900)  DISCONTD: propofol Last Rate: 20 mcg/kg/min (11/28/11 1438)    Estimated Nutritional Needs:   Kcal: 1900-2000 (while on vent) Protein: 100-110 gm Fluid: 1 mL per kcal  NUTRITION DIAGNOSIS: -Inadequate oral intake (NI-2.1).  Status: Ongoing  RELATED TO: recent intubation  AS EVIDENCE BY: NPO  status  MONITORING/EVALUATION(Goals): Goal: Pt to tolerate diet advancement once extubated. Monitor: Extubation, diet order changes, possible SLP recommendations, weight, labs  EDUCATION NEEDS: -Education not appropriate at this time  INTERVENTION:  Recommend diet advancement with extubation. Pt may benefit from SLP evaluation post extubation.  If pt not extubated <3 days, recommend initiation of enteral nutrition (Jevity 1.2: goal rate of 50 mL/hr with ProStat (30mL) BID to provide total 1983 kcal, 96 gm protein daily (based on current propofol rate)).   DOCUMENTATION CODES Per approved criteria  -Morbid Obesity    Leonette Most 11/29/2011, 10:53 AM

## 2011-11-29 NOTE — Progress Notes (Signed)
eLink Physician-Brief Progress Note Patient Name: Terry Robertson DOB: [birth year] MRN: [id number]  Date of Service  11/29/2011   HPI/Events of Note   Fever in setting cva already cultured  eICU Interventions  Add tylenal x1   Intervention Category Minor Interventions: Routine modifications to care plan (e.g. PRN medications for pain, fever)  Nelda Bucks. 11/29/2011, 3:36 PM

## 2011-11-30 ENCOUNTER — Inpatient Hospital Stay (HOSPITAL_COMMUNITY): Payer: Medicare Other

## 2011-11-30 DIAGNOSIS — I10 Essential (primary) hypertension: Secondary | ICD-10-CM

## 2011-11-30 DIAGNOSIS — G40401 Other generalized epilepsy and epileptic syndromes, not intractable, with status epilepticus: Secondary | ICD-10-CM

## 2011-11-30 LAB — URINE MICROSCOPIC-ADD ON

## 2011-11-30 LAB — CBC
HCT: 34.5 % — ABNORMAL LOW (ref 39.0–52.0)
Hemoglobin: 11 g/dL — ABNORMAL LOW (ref 13.0–17.0)
MCV: 83.3 fL (ref 78.0–100.0)
RBC: 4.14 MIL/uL — ABNORMAL LOW (ref 4.22–5.81)
RDW: 15.2 % (ref 11.5–15.5)
WBC: 5.1 10*3/uL (ref 4.0–10.5)

## 2011-11-30 LAB — BASIC METABOLIC PANEL
BUN: 7 mg/dL (ref 6–23)
BUN: 8 mg/dL (ref 6–23)
CO2: 23 mEq/L (ref 19–32)
Calcium: 8.8 mg/dL (ref 8.4–10.5)
Chloride: 107 mEq/L (ref 96–112)
GFR calc Af Amer: 87 mL/min — ABNORMAL LOW (ref >90)
GFR calc non Af Amer: 77 mL/min — ABNORMAL LOW (ref >90)
Glucose, Bld: 96 mg/dL (ref 70–99)
Glucose, Bld: 112 mg/dL — ABNORMAL HIGH (ref 70–99)
Potassium: 3.1 mEq/L — ABNORMAL LOW (ref 3.5–5.1)

## 2011-11-30 LAB — URINALYSIS, ROUTINE W REFLEX MICROSCOPIC
Ketones, ur: NEGATIVE mg/dL
Nitrite: NEGATIVE
Specific Gravity, Urine: 1.022 (ref 1.005–1.030)
Urobilinogen, UA: 1 mg/dL (ref 0.0–1.0)
pH: 5.5 (ref 5.0–8.0)

## 2011-11-30 LAB — GLUCOSE, CAPILLARY: Glucose-Capillary: 131 mg/dL — ABNORMAL HIGH (ref 70–99)

## 2011-11-30 LAB — CULTURE, RESPIRATORY W GRAM STAIN

## 2011-11-30 LAB — HEPARIN LEVEL (UNFRACTIONATED): Heparin Unfractionated: 0.14 IU/mL — ABNORMAL LOW (ref 0.30–0.70)

## 2011-11-30 MED ORDER — SODIUM CHLORIDE 0.9 % IV SOLN
50.0000 ug/h | INTRAVENOUS | Status: DC
  Administered 2011-11-30: 25 ug/h via INTRAVENOUS
  Filled 2011-11-30: qty 50

## 2011-11-30 MED ORDER — ROCURONIUM BROMIDE 50 MG/5ML IV SOLN
80.0000 mg | Freq: Once | INTRAVENOUS | Status: AC
  Administered 2011-11-30: 80 mg via INTRAVENOUS

## 2011-11-30 MED ORDER — MIDAZOLAM HCL 2 MG/2ML IJ SOLN
6.0000 mg | Freq: Once | INTRAMUSCULAR | Status: AC
  Administered 2011-11-30: 6 mg via INTRAVENOUS

## 2011-11-30 MED ORDER — FENTANYL BOLUS VIA INFUSION
50.0000 ug | Freq: Four times a day (QID) | INTRAVENOUS | Status: DC | PRN
  Filled 2011-11-30: qty 100

## 2011-11-30 MED ORDER — JEVITY 1.2 CAL PO LIQD
1000.0000 mL | ORAL | Status: DC
  Filled 2011-11-30 (×2): qty 1000

## 2011-11-30 MED ORDER — MIDAZOLAM HCL 2 MG/2ML IJ SOLN
INTRAMUSCULAR | Status: AC
  Filled 2011-11-30: qty 4

## 2011-11-30 MED ORDER — RACEPINEPHRINE HCL 2.25 % IN NEBU
INHALATION_SOLUTION | RESPIRATORY_TRACT | Status: AC
  Administered 2011-11-30: 0.5 mL via RESPIRATORY_TRACT
  Filled 2011-11-30: qty 0.5

## 2011-11-30 MED ORDER — RACEPINEPHRINE HCL 2.25 % IN NEBU
0.5000 mL | INHALATION_SOLUTION | Freq: Once | RESPIRATORY_TRACT | Status: AC
  Administered 2011-11-30: 0.5 mL via RESPIRATORY_TRACT

## 2011-11-30 MED ORDER — FENTANYL CITRATE 0.05 MG/ML IJ SOLN
300.0000 ug | Freq: Once | INTRAMUSCULAR | Status: AC
  Administered 2011-11-30: 300 ug via INTRAVENOUS

## 2011-11-30 MED ORDER — FUROSEMIDE 10 MG/ML IJ SOLN
40.0000 mg | Freq: Four times a day (QID) | INTRAMUSCULAR | Status: AC
  Administered 2011-11-30 (×3): 40 mg via INTRAVENOUS
  Filled 2011-11-30 (×3): qty 4

## 2011-11-30 MED ORDER — METHYLPREDNISOLONE SODIUM SUCC 125 MG IJ SOLR
60.0000 mg | Freq: Four times a day (QID) | INTRAMUSCULAR | Status: AC
  Administered 2011-11-30 – 2011-12-03 (×12): 60 mg via INTRAVENOUS
  Filled 2011-11-30 (×5): qty 0.96
  Filled 2011-11-30: qty 2
  Filled 2011-11-30: qty 0.96
  Filled 2011-11-30: qty 2
  Filled 2011-11-30: qty 0.96
  Filled 2011-11-30: qty 2
  Filled 2011-11-30 (×2): qty 0.96

## 2011-11-30 MED ORDER — ACETAMINOPHEN 160 MG/5ML PO SOLN
650.0000 mg | Freq: Four times a day (QID) | ORAL | Status: DC | PRN
  Administered 2011-11-30: 650 mg
  Filled 2011-11-30: qty 20.3

## 2011-11-30 MED ORDER — POTASSIUM CHLORIDE 20 MEQ/15ML (10%) PO LIQD
40.0000 meq | Freq: Three times a day (TID) | ORAL | Status: AC
  Administered 2011-11-30 (×2): 40 meq
  Filled 2011-11-30 (×3): qty 30

## 2011-11-30 MED ORDER — ACETAMINOPHEN 325 MG PO TABS
650.0000 mg | ORAL_TABLET | Freq: Four times a day (QID) | ORAL | Status: DC | PRN
  Administered 2011-12-07 (×2): 650 mg via ORAL
  Filled 2011-11-30 (×2): qty 2

## 2011-11-30 MED ORDER — FENTANYL CITRATE 0.05 MG/ML IJ SOLN
INTRAMUSCULAR | Status: AC
  Filled 2011-11-30: qty 4

## 2011-11-30 MED ORDER — ACETAMINOPHEN 650 MG RE SUPP
650.0000 mg | RECTAL | Status: DC | PRN
  Administered 2011-11-30: 650 mg via RECTAL
  Filled 2011-11-30: qty 1

## 2011-11-30 MED ORDER — ETOMIDATE 2 MG/ML IV SOLN
35.0000 mg | Freq: Once | INTRAVENOUS | Status: AC
  Administered 2011-11-30: 35 mg via INTRAVENOUS

## 2011-11-30 MED ORDER — PROPOFOL 10 MG/ML IV EMUL
5.0000 ug/kg/min | INTRAVENOUS | Status: DC
  Administered 2011-11-30: 5 ug/kg/min via INTRAVENOUS
  Filled 2011-11-30 (×2): qty 100

## 2011-11-30 MED ORDER — POTASSIUM CHLORIDE 20 MEQ/15ML (10%) PO LIQD
40.0000 meq | ORAL | Status: AC
  Administered 2011-11-30 – 2011-12-01 (×2): 40 meq
  Filled 2011-11-30 (×2): qty 30

## 2011-11-30 NOTE — Procedures (Signed)
Intubation Procedure Note Terry Robertson [id number] 1929-01-22  Procedure: Intubation Indications: Airway protection and maintenance  Procedure Details Consent: Risks of procedure as well as the alternatives and risks of each were explained to the (patient/caregiver).  Consent for procedure obtained. Time Out: Verified patient identification, verified procedure, site/side was marked, verified correct patient position, special equipment/implants available, medications/allergies/relevent history reviewed, required imaging and test results available.  Performed  Maximum sterile technique was used including antiseptics, cap, gloves, gown, hand hygiene, mask and sheet.  MAC    Evaluation Hemodynamic Status: BP stable throughout; O2 sats: stable throughout Patient's Current Condition: stable Complications: No apparent complications Patient did tolerate procedure well. Chest X-ray ordered to verify placement.  CXR: pending.   Terry Robertson 11/30/2011

## 2011-11-30 NOTE — Progress Notes (Signed)
Called by rn to eval airway  Patient clearly stridorous, RR is very high and effort is increasing.  Spoke with patient and family.  Decided to reintubate.  Will sedate, intubate and mechanically ventilate.  Family updated.  Patient was originally reluctant but after speaking with him with his family here decided to proceed with intubation.  Additional CC time of 45 min excluding procedure.  Alyson Reedy, M.D. [facility] Pulmonary/Critical Care Medicine. Pager: [phone]. After hours pager: [phone].

## 2011-11-30 NOTE — Progress Notes (Addendum)
Subjective:  Patient with seizures, remains intubated  And small sedation.  Planning for extubation this morning Patient is awake,alert and follows commands. 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  No acute pathology He denies chest pain , headaches,  Objective: Current vital signs: BP 128/56  Pulse 64  Temp 100.4 F (38 C) (Other (Comment))  Resp 16  Ht 5\' 5"  (1.651 m)  Wt 111.8 kg (246 lb 7.6 oz)  BMI 41.02 kg/m2  SpO2 100% Vital signs in last 24 hours: Temp:  [100.4 F (38 C)-102 F (38.9 C)] 100.4 F (38 C) (09/26 0700) Pulse Rate:  [64-93] 64  (09/26 0700) Resp:  [15-24] 16  (09/26 0700) BP: (122-158)/(54-74) 128/56 mmHg (09/26 0700) SpO2:  [95 %-100 %] 100 % (09/26 0700) FiO2 (%):  [40 %] 40 % (09/26 0700) Weight:  [111.8 kg (246 lb 7.6 oz)] 111.8 kg (246 lb 7.6 oz) (09/26 0500)  Intake/Output from previous day: 09/25 0701 - 09/26 0700 In: 1163.7 [I.V.:513.7; NG/GT:30; IV Piggyback:620] Out: 2035 [Urine:2035] Intake/Output this shift:   Nutritional status: NPO   Physical Exam  General: Awake alert and follows commands Lungs: CTA Heart: RRR Abdomen: Bowel sounds present    Neurologic Exam: AAO follows commands  Cranial Nerves: Pupils isocoria, reactive,  GAG intact No facial droop No facial hemisensory loss  Motor: able to move all the 4 extremities, right side weaker than the left side,  Will re evaluate because he is in 2 point restraints  Sensory deficits on the right side Extrapyramidals: negative    Lab Results: Results for orders placed during the hospital encounter of 11/28/11 (from the past 48 hour(s))  URINALYSIS, ROUTINE W REFLEX MICROSCOPIC     Status: Abnormal   Collection Time   11/28/11  1:30 PM      Component Value Range Comment   Color, Urine YELLOW  YELLOW    APPearance CLOUDY (*) CLEAR    Specific Gravity, Urine 1.011  1.005 - 1.030    pH 5.0  5.0 - 8.0    Glucose, UA NEGATIVE  NEGATIVE mg/dL    Hgb urine dipstick MODERATE (*)  NEGATIVE    Bilirubin Urine NEGATIVE  NEGATIVE    Ketones, ur NEGATIVE  NEGATIVE mg/dL    Protein, ur 409 (*) NEGATIVE mg/dL    Urobilinogen, UA 0.2  0.0 - 1.0 mg/dL    Nitrite NEGATIVE  NEGATIVE    Leukocytes, UA NEGATIVE  NEGATIVE   URINE MICROSCOPIC-ADD ON     Status: Abnormal   Collection Time   11/28/11  1:30 PM      Component Value Range Comment   Squamous Epithelial / LPF RARE  RARE    WBC, UA 0-2  <3 WBC/hpf    RBC / HPF 0-2  <3 RBC/hpf    Bacteria, UA RARE  RARE    Casts HYALINE CASTS (*) NEGATIVE    Urine-Other AMORPHOUS URATES/PHOSPHATES     COMPREHENSIVE METABOLIC PANEL     Status: Abnormal   Collection Time   11/28/11  2:40 PM      Component Value Range Comment   Sodium 137  135 - 145 mEq/L    Potassium 4.1  3.5 - 5.1 mEq/L    Chloride 104  96 - 112 mEq/L    CO2 19  19 - 32 mEq/L    Glucose, Bld 146 (*) 70 - 99 mg/dL    BUN 11  6 - 23 mg/dL    Creatinine, Ser 8.11  0.50 - 1.35  mg/dL    Calcium 8.1 (*) 8.4 - 10.5 mg/dL    Total Protein 8.3  6.0 - 8.3 g/dL    Albumin 3.1 (*) 3.5 - 5.2 g/dL    AST 19  0 - 37 U/L    ALT 9  0 - 53 U/L    Alkaline Phosphatase 44  39 - 117 U/L    Total Bilirubin 0.4  0.3 - 1.2 mg/dL    GFR calc non Af Amer 79 (*) >90 mL/min    GFR calc Af Amer >90  >90 mL/min   CBC WITH DIFFERENTIAL     Status: Abnormal   Collection Time   11/28/11  2:40 PM      Component Value Range Comment   WBC 5.6  4.0 - 10.5 K/uL    RBC 4.10 (*) 4.22 - 5.81 MIL/uL    Hemoglobin 11.0 (*) 13.0 - 17.0 g/dL    HCT 40.9 (*) 81.1 - 52.0 %    MCV 84.9  78.0 - 100.0 fL    MCH 26.8  26.0 - 34.0 pg    MCHC 31.6  30.0 - 36.0 g/dL    RDW 91.4  78.2 - 95.6 %    Platelets 103 (*) 150 - 400 K/uL PLATELET COUNT CONFIRMED BY SMEAR   Neutrophils Relative 61  43 - 77 %    Lymphocytes Relative 15  12 - 46 %    Monocytes Relative 24 (*) 3 - 12 %    Eosinophils Relative 0  0 - 5 %    Basophils Relative 0  0 - 1 %    Neutro Abs 3.5  1.7 - 7.7 K/uL    Lymphs Abs 0.8  0.7 - 4.0  K/uL    Monocytes Absolute 1.3 (*) 0.1 - 1.0 K/uL    Eosinophils Absolute 0.0  0.0 - 0.7 K/uL    Basophils Absolute 0.0  0.0 - 0.1 K/uL    Smear Review LARGE PLATELETS PRESENT     MAGNESIUM     Status: Normal   Collection Time   11/28/11  2:40 PM      Component Value Range Comment   Magnesium 2.1  1.5 - 2.5 mg/dL   TROPONIN I     Status: Normal   Collection Time   11/28/11  2:41 PM      Component Value Range Comment   Troponin I <0.30  <0.30 ng/mL   PROTIME-INR     Status: Abnormal   Collection Time   11/28/11  2:41 PM      Component Value Range Comment   Prothrombin Time 18.8 (*) 11.6 - 15.2 seconds    INR 1.63 (*) 0.00 - 1.49   PROCALCITONIN     Status: Normal   Collection Time   11/28/11  2:41 PM      Component Value Range Comment   Procalcitonin <0.10     POCT I-STAT 3, BLOOD GAS (G3+)     Status: Abnormal   Collection Time   11/28/11  2:56 PM      Component Value Range Comment   pH, Arterial 7.350  7.350 - 7.450    pCO2 arterial 36.6  35.0 - 45.0 mmHg    pO2, Arterial 119.0 (*) 80.0 - 100.0 mmHg    Bicarbonate 20.2  20.0 - 24.0 mEq/L    TCO2 21  0 - 100 mmol/L    O2 Saturation 98.0      Acid-base deficit 5.0 (*) 0.0 - 2.0 mmol/L  Patient temperature 37.0 C      Collection site RADIAL, ALLEN'S TEST ACCEPTABLE      Drawn by Operator      Sample type ARTERIAL     CULTURE, RESPIRATORY     Status: Normal (Preliminary result)   Collection Time   11/28/11  3:00 PM      Component Value Range Comment   Specimen Description ENDOTRACHEAL      Special Requests NONE      Gram Stain        Value: RARE WBC PRESENT, PREDOMINANTLY PMN     RARE SQUAMOUS EPITHELIAL CELLS PRESENT     NO ORGANISMS SEEN   Culture Non-Pathogenic Oropharyngeal-type Flora Isolated.      Report Status PENDING     GLUCOSE, CAPILLARY     Status: Abnormal   Collection Time   11/28/11  3:44 PM      Component Value Range Comment   Glucose-Capillary 123 (*) 70 - 99 mg/dL    Comment 1 Notify RN      Comment  2 Documented in Chart     MRSA PCR SCREENING     Status: Normal   Collection Time   11/28/11  3:45 PM      Component Value Range Comment   MRSA by PCR NEGATIVE  NEGATIVE   CULTURE, BLOOD (ROUTINE X 2)     Status: Normal (Preliminary result)   Collection Time   11/28/11  3:51 PM      Component Value Range Comment   Specimen Description BLOOD ARM LEFT      Special Requests BOTTLES DRAWN AEROBIC AND ANAEROBIC 10CC      Culture  Setup Time 11/28/2011 20:13      Culture        Value:        BLOOD CULTURE RECEIVED NO GROWTH TO DATE CULTURE WILL BE HELD FOR 5 DAYS BEFORE ISSUING A FINAL NEGATIVE REPORT   Report Status PENDING     CULTURE, BLOOD (ROUTINE X 2)     Status: Normal (Preliminary result)   Collection Time   11/28/11  4:06 PM      Component Value Range Comment   Specimen Description BLOOD HAND LEFT      Special Requests BOTTLES DRAWN AEROBIC AND ANAEROBIC 10CC      Culture  Setup Time 11/28/2011 20:13      Culture        Value:        BLOOD CULTURE RECEIVED NO GROWTH TO DATE CULTURE WILL BE HELD FOR 5 DAYS BEFORE ISSUING A FINAL NEGATIVE REPORT   Report Status PENDING     GLUCOSE, CAPILLARY     Status: Normal   Collection Time   11/28/11  7:54 PM      Component Value Range Comment   Glucose-Capillary 78  70 - 99 mg/dL    Comment 1 Notify RN      Comment 2 Documented in Chart     GLUCOSE, CAPILLARY     Status: Normal   Collection Time   11/28/11 11:18 PM      Component Value Range Comment   Glucose-Capillary 86  70 - 99 mg/dL   GLUCOSE, CAPILLARY     Status: Normal   Collection Time   11/29/11  3:46 AM      Component Value Range Comment   Glucose-Capillary 89  70 - 99 mg/dL   BLOOD GAS, ARTERIAL     Status: Abnormal   Collection Time  11/29/11  4:02 AM      Component Value Range Comment   FIO2 0.40      Delivery systems VENTILATOR      Mode PRESSURE REGULATED VOLUME CONTROL      VT 600      Rate 16      Peep/cpap 5.0      pH, Arterial 7.427  7.350 - 7.450    pCO2  arterial 33.3 (*) 35.0 - 45.0 mmHg    pO2, Arterial 87.0  80.0 - 100.0 mmHg    Bicarbonate 21.6  20.0 - 24.0 mEq/L    TCO2 22.6  0 - 100 mmol/L    Acid-base deficit 2.1 (*) 0.0 - 2.0 mmol/L    O2 Saturation 96.7      Patient temperature 98.6      Collection site RIGHT RADIAL      Drawn by [phone]      Sample type ARTERIAL DRAW      Allens test (pass/fail) PASS  PASS   CBC     Status: Abnormal   Collection Time   11/29/11  4:55 AM      Component Value Range Comment   WBC 5.1  4.0 - 10.5 K/uL    RBC 4.11 (*) 4.22 - 5.81 MIL/uL    Hemoglobin 11.0 (*) 13.0 - 17.0 g/dL    HCT 40.9 (*) 81.1 - 52.0 %    MCV 83.7  78.0 - 100.0 fL    MCH 26.8  26.0 - 34.0 pg    MCHC 32.0  30.0 - 36.0 g/dL    RDW 91.4  78.2 - 95.6 %    Platelets 88 (*) 150 - 400 K/uL CONSISTENT WITH PREVIOUS RESULT  BASIC METABOLIC PANEL     Status: Abnormal   Collection Time   11/29/11  4:55 AM      Component Value Range Comment   Sodium 140  135 - 145 mEq/L    Potassium 3.2 (*) 3.5 - 5.1 mEq/L DELTA CHECK NOTED   Chloride 110  96 - 112 mEq/L    CO2 21  19 - 32 mEq/L    Glucose, Bld 93  70 - 99 mg/dL    BUN 9  6 - 23 mg/dL    Creatinine, Ser 2.13  0.50 - 1.35 mg/dL    Calcium 8.6  8.4 - 08.6 mg/dL    GFR calc non Af Amer 80 (*) >90 mL/min    GFR calc Af Amer >90  >90 mL/min   MAGNESIUM     Status: Normal   Collection Time   11/29/11  4:55 AM      Component Value Range Comment   Magnesium 2.1  1.5 - 2.5 mg/dL   PHOSPHORUS     Status: Abnormal   Collection Time   11/29/11  4:55 AM      Component Value Range Comment   Phosphorus 1.9 (*) 2.3 - 4.6 mg/dL   GLUCOSE, CAPILLARY     Status: Normal   Collection Time   11/29/11  7:56 AM      Component Value Range Comment   Glucose-Capillary 83  70 - 99 mg/dL   GLUCOSE, CAPILLARY     Status: Normal   Collection Time   11/29/11 11:47 AM      Component Value Range Comment   Glucose-Capillary 94  70 - 99 mg/dL   CBC     Status: Abnormal   Collection Time   11/29/11  1:06  PM  Component Value Range Comment   WBC 4.6  4.0 - 10.5 K/uL    RBC 4.15 (*) 4.22 - 5.81 MIL/uL    Hemoglobin 11.0 (*) 13.0 - 17.0 g/dL    HCT 16.1 (*) 09.6 - 52.0 %    MCV 83.9  78.0 - 100.0 fL    MCH 26.5  26.0 - 34.0 pg    MCHC 31.6  30.0 - 36.0 g/dL    RDW 04.5  40.9 - 81.1 %    Platelets 95 (*) 150 - 400 K/uL CONSISTENT WITH PREVIOUS RESULT  GLUCOSE, CAPILLARY     Status: Normal   Collection Time   11/29/11  4:01 PM      Component Value Range Comment   Glucose-Capillary 89  70 - 99 mg/dL    Comment 1 Notify RN      Comment 2 Documented in Chart     GLUCOSE, CAPILLARY     Status: Abnormal   Collection Time   11/29/11  7:52 PM      Component Value Range Comment   Glucose-Capillary 102 (*) 70 - 99 mg/dL    Comment 1 Notify RN      Comment 2 Documented in Chart     HEPARIN LEVEL (UNFRACTIONATED)     Status: Abnormal   Collection Time   11/29/11  8:44 PM      Component Value Range Comment   Heparin Unfractionated 0.12 (*) 0.30 - 0.70 IU/mL   GLUCOSE, CAPILLARY     Status: Normal   Collection Time   11/29/11 11:32 PM      Component Value Range Comment   Glucose-Capillary 94  70 - 99 mg/dL   GLUCOSE, CAPILLARY     Status: Normal   Collection Time   11/30/11  3:39 AM      Component Value Range Comment   Glucose-Capillary 91  70 - 99 mg/dL   CBC     Status: Abnormal   Collection Time   11/30/11  5:04 AM      Component Value Range Comment   WBC 5.1  4.0 - 10.5 K/uL    RBC 4.14 (*) 4.22 - 5.81 MIL/uL    Hemoglobin 11.0 (*) 13.0 - 17.0 g/dL    HCT 91.4 (*) 78.2 - 52.0 %    MCV 83.3  78.0 - 100.0 fL    MCH 26.6  26.0 - 34.0 pg    MCHC 31.9  30.0 - 36.0 g/dL    RDW 95.6  21.3 - 08.6 %    Platelets 86 (*) 150 - 400 K/uL CONSISTENT WITH PREVIOUS RESULT  BASIC METABOLIC PANEL     Status: Abnormal   Collection Time   11/30/11  5:04 AM      Component Value Range Comment   Sodium 141  135 - 145 mEq/L    Potassium 3.1 (*) 3.5 - 5.1 mEq/L    Chloride 107  96 - 112 mEq/L    CO2  23  19 - 32 mEq/L    Glucose, Bld 96  70 - 99 mg/dL    BUN 7  6 - 23 mg/dL    Creatinine, Ser 5.78  0.50 - 1.35 mg/dL    Calcium 8.5  8.4 - 46.9 mg/dL    GFR calc non Af Amer 75 (*) >90 mL/min    GFR calc Af Amer 87 (*) >90 mL/min   MAGNESIUM     Status: Normal   Collection Time   11/30/11  5:04 AM  Component Value Range Comment   Magnesium 2.2  1.5 - 2.5 mg/dL   PHOSPHORUS     Status: Normal   Collection Time   11/30/11  5:04 AM      Component Value Range Comment   Phosphorus 3.6  2.3 - 4.6 mg/dL   HEPARIN LEVEL (UNFRACTIONATED)     Status: Abnormal   Collection Time   11/30/11  5:04 AM      Component Value Range Comment   Heparin Unfractionated 0.14 (*) 0.30 - 0.70 IU/mL     Recent Results (from the past 240 hour(s))  CULTURE, RESPIRATORY     Status: Normal (Preliminary result)   Collection Time   11/28/11  3:00 PM      Component Value Range Status Comment   Specimen Description ENDOTRACHEAL   Final    Special Requests NONE   Final    Gram Stain     Final    Value: RARE WBC PRESENT, PREDOMINANTLY PMN     RARE SQUAMOUS EPITHELIAL CELLS PRESENT     NO ORGANISMS SEEN   Culture Non-Pathogenic Oropharyngeal-type Flora Isolated.   Final    Report Status PENDING   Incomplete   MRSA PCR SCREENING     Status: Normal   Collection Time   11/28/11  3:45 PM      Component Value Range Status Comment   MRSA by PCR NEGATIVE  NEGATIVE Final   CULTURE, BLOOD (ROUTINE X 2)     Status: Normal (Preliminary result)   Collection Time   11/28/11  3:51 PM      Component Value Range Status Comment   Specimen Description BLOOD ARM LEFT   Final    Special Requests BOTTLES DRAWN AEROBIC AND ANAEROBIC 10CC   Final    Culture  Setup Time 11/28/2011 20:13   Final    Culture     Final    Value:        BLOOD CULTURE RECEIVED NO GROWTH TO DATE CULTURE WILL BE HELD FOR 5 DAYS BEFORE ISSUING A FINAL NEGATIVE REPORT   Report Status PENDING   Incomplete   CULTURE, BLOOD (ROUTINE X 2)     Status: Normal  (Preliminary result)   Collection Time   11/28/11  4:06 PM      Component Value Range Status Comment   Specimen Description BLOOD HAND LEFT   Final    Special Requests BOTTLES DRAWN AEROBIC AND ANAEROBIC 10CC   Final    Culture  Setup Time 11/28/2011 20:13   Final    Culture     Final    Value:        BLOOD CULTURE RECEIVED NO GROWTH TO DATE CULTURE WILL BE HELD FOR 5 DAYS BEFORE ISSUING A FINAL NEGATIVE REPORT   Report Status PENDING   Incomplete     Lipid Panel No results found for this basename: CHOL,TRIG,HDL,CHOLHDL,VLDL,LDLCALC in the last 72 hours  Studies/Results: Ct Head Wo Contrast  11/28/2011  *RADIOLOGY REPORT*  Clinical Data: Difficulty speaking seizure.  History high blood pressure.  CT HEAD WITHOUT CONTRAST  Technique:  Contiguous axial images were obtained from the base of the skull through the vertex without contrast.  Comparison: 08/09/2011 CT and MR.  Findings: No intracranial hemorrhage.  Remote moderate sized left parietal lobe infarct extending to posterior opercular region.  This was noted to be an acute infarct on the 08/09/2011 examination.  Remote infarcts right caudate head/lenticular nucleus.  No CT evidence of large acute infarct.  Global  atrophy without hydrocephalus.  Small vessel disease type changes.  No intracranial mass lesion detected on this unenhanced exam.  Remote right medial orbital wall fracture versus congenital defect.  IMPRESSION: No intracranial hemorrhage.  Remote moderate sized left parietal lobe infarct extending to posterior opercular region.  This was noted to be an acute infarct on the 08/09/2011 examination.  Remote infarcts right caudate head/lenticular nucleus.  No CT evidence of large acute infarct.   Original Report Authenticated By: Fuller Canada, M.D.    Mr Brain Wo Contrast  11/29/2011  *RADIOLOGY REPORT*  Clinical Data: 78 year old with high blood pressure and history of stroke.  Recently noted to have difficulty with speech with right-  sided gaze and right arm twitching.  MRI HEAD WITHOUT CONTRAST  Technique:  Multiplanar, multiecho pulse sequences of the brain and surrounding structures were obtained according to standard protocol without intravenous contrast.  Comparison: 11/28/2011 head CT.  08/09/2011 brain MR.  Findings: Remote left parietal lobe infarct with extension to the posterior aspect of the left opercular region. Laminar necrosis. T2 shine through at this level on diffusion sequence without evidence of an acute infarct.  Tiny remote left cerebellar infarcts.  Remote small right caudate head/lenticular nucleus infarct.  Small vessel disease type changes.  No intracranial hemorrhage.  No intracranial mass lesion detected on this unenhanced exam.  Global atrophy without hydrocephalus.  Major intracranial vascular structures are patent.  Polypoid opacification inferior aspect right maxillary sinus.  Mild mucosal thickening left maxillary sinus, ethmoid sinus air cells and right frontal sinus.  The patient is intubated.  Slight narrowing of the upper cervical canal.  IMPRESSION:  Remote left parietal lobe infarct.  T2 shine through at this level on diffusion sequence without evidence of an acute infarct.   Original Report Authenticated By: Fuller Canada, M.D.    Dg Chest Port 1 View  11/30/2011  *RADIOLOGY REPORT*  Clinical Data: Evaluate endotracheal tube positioning  PORTABLE CHEST - 1 VIEW  Comparison: 11/29/2011; 11/28/2011; 08/05/2011  Findings:  Grossly unchanged enlarged cardiac silhouette and mediastinal contours.  Stable position of support apparatus.  Lung volumes remain persistently reduced with grossly unchanged perihilar and bibasilar heterogeneous opacities, left greater than right.  No new focal airspace opacities.  Mild cephalization of flow without frank evidence of pulmonary edema.  Query trace bilateral pleural effusions.  No pneumothorax.  Unchanged bones.  IMPRESSION: 1.  Stable positioning of support apparatus.   No pneumothorax. 2.  Persistently reduced lung volumes with grossly unchanged perihilar and bibasilar opacities, atelectasis versus infiltrate.   Original Report Authenticated By: Waynard Reeds, M.D.    Portable Chest Xray In Am  11/29/2011  *RADIOLOGY REPORT*  Clinical Data: Evaluate for infiltrate.  Assess endotracheal tube.  PORTABLE CHEST - 1 VIEW  Comparison: 11/28/2011  Findings: Cardiomegaly.  Central vascular congestion.  Bibasilar opacities.  Hypoaeration.  Endotracheal tube tip 2.7 cm proximal to the carina.  NG tube descends below the level of the image.  No pneumothorax.  Small effusions not excluded.  No interval osseous change.  IMPRESSION: Hypoaeration results in interstitial and vascular crowding. Allowing for this, there is suggestion of mild cardiomegaly and edema.  Bibasilar opacities; atelectasis versus infiltrate.  Unchanged support devices as above.   Original Report Authenticated By: Waneta Martins, M.D.    Dg Chest Portable 1 View  11/28/2011  *RADIOLOGY REPORT*  Clinical Data: CVA.  Post intubation.  PORTABLE CHEST - 1 VIEW  Comparison: 08/05/2011  Findings: Endotracheal tube  is 2.5 cm above the carina.  Heart mildly enlarged.  Minimal bibasilar atelectasis.  Mild vascular congestion.  IMPRESSION: Endotracheal tube 2.5 cm above the carina.   Original Report Authenticated By: Cyndie Chime, M.D.     Medications:  Scheduled:   . acetaminophen (TYLENOL) oral liquid 160 mg/5 mL  650 mg Per Tube Once  . antiseptic oral rinse  15 mL Mouth Rinse QID  . chlorhexidine  15 mL Mouth Rinse BID  . influenza  inactive virus vaccine  0.5 mL Intramuscular Tomorrow-1000  . insulin aspart  2-6 Units Subcutaneous Q4H  . levetiracetam  500 mg Intravenous Q12H  . metoprolol tartrate  12.5 mg Per Tube BID  . pantoprazole (PROTONIX) IV  40 mg Intravenous QHS  . pneumococcal 23 valent vaccine  0.5 mL Intramuscular Tomorrow-1000  . potassium phosphate IVPB (mmol)  30 mmol Intravenous Once   . DISCONTD: metoprolol  5 mg Intravenous Q6H    Assessment/Plan:  Patient with seizures, multiple strokes bihemispheric, remains intubated. No complications overnight No seizures MRI negative for any acute etiology.   Recommend: C/W AED No further Neurology input   Spent approximately 30 minutes of critical care time, reviewing charts, medications, labs and images Odes Lolli V-P Eilleen Kempf., MD., Ph.D.,MS 11/30/2011 8:08 AM

## 2011-11-30 NOTE — Progress Notes (Signed)
Name: Terry Robertson MRN: [id number] DOB: [birth year]    LOS: 2  Referring Provider:  EDP -  Reason for Referral:    PULMONARY / CRITICAL CARE MEDICINE  HPI:  78 y/o M with PMH of CAD, HTN, CVA, ? DM (on no meds) who was noted on 9/23 to be driving erratically, went home and EMS was notified and found him sitting in a chair unable to speak but awake with right sided gaze, right arm twitching.  In ED upon arrival, was noted to have seizure. Further decompensated and required intubation per EDP.  Concern for new CVA.  PCCM consulted for ICU admit.     Vital Signs: Temp:  [100 F (37.8 C)-102 F (38.9 C)] 100 F (37.8 C) (09/26 0800) Pulse Rate:  [64-93] 80  (09/26 0810) Resp:  [15-24] 24  (09/26 0810) BP: (122-158)/(56-74) 131/60 mmHg (09/26 0810) SpO2:  [95 %-100 %] 100 % (09/26 0810) FiO2 (%):  [40 %] 40 % (09/26 0810) Weight:  [111.8 kg (246 lb 7.6 oz)] 111.8 kg (246 lb 7.6 oz) (09/26 0500)  Physical Examination: General:  Elderly male in NAD Neuro:  Sedated on vent HEENT:  OETT, mm pink/moist Cardiovascular:  s1s2 rrr, no m/r/g Lungs:  resp's even/non-labored on vent, breath sounds diminished bilaterally Abdomen:  Round/soft, bsx4 active Musculoskeletal:  No acute deformities Skin:  Warm/dry, no edema  Active Problems:  Acute respiratory failure  Altered mental status  Seizure  Status epilepticus, generalized convulsive   ASSESSMENT AND PLAN  NEUROLOGIC  A:   Acute Seizure - concern for new CVA.  Initial 9/24 CT negative for acute CVA.   Altered Mental Status   P:   -Propofol for sedation to allow for frequent neuro checks and BP control, will hold now for a weaning trial. -Keppra load and dosing per neuro's recommendations. -Neurology Consult appreciated. -EEG, further imaging per Neuro.   -Heparin drip and will restart coumadin in AM. -MRI noted.  PULMONARY  Lab 11/29/11 0402 11/28/11 1456  PHART 7.427 7.350  PCO2ART 33.3* 36.6  PO2ART 87.0 119.0*  HCO3  21.6 20.2  O2SAT 96.7 98.0   Ventilator Settings: Vent Mode:  [-] CPAP;PSV FiO2 (%):  [40 %] 40 % Set Rate:  [15 bmp-16 bmp] 16 bmp Vt Set:  [550 mL-600 mL] 600 mL PEEP:  [5 cmH20] 5 cmH20 Pressure Support:  [10 cmH20] 10 cmH20 Plateau Pressure:  [19 cmH20-21 cmH20] 19 cmH20 CXR:  9/23>>>  ETT:  9/23>>>  A:   Acute Respiratory Failure -in setting of neurological process  P:   -SBT today, hopefully will be able to extubate when more awake. -Propofol for sedation to allow for neuro checks. -Additional lasix today. -Will need cuff leak prior to extubation given traumatic intubation.  CARDIOVASCULAR  Lab 11/28/11 1441  TROPONINI <0.30  LATICACIDVEN --  PROBNP --   ECG:   Lines:    A:  Hx of Afib - diagnosed in 08/2011 as new onset.   Hx of HTN Hx of HLD  P:  -Coumadin to be started in AM (9/27). -Full dose heparin. -Propofol but hold later for weaning. -Metoprolol started per cards. -Hold zocor. -KVO IVF.  RENAL  Lab 11/30/11 0504 11/29/11 0455 11/28/11 1440  NA 141 140 137  K 3.1* 3.2* --  CL 107 110 104  CO2 23 21 19   BUN 7 9 11   CREATININE 0.98 0.84 0.87  CALCIUM 8.5 8.6 8.1*  MG 2.2 2.1 2.1  PHOS 3.6 1.9* --  Intake/Output      09/25 0701 - 09/26 0700 09/26 0701 - 09/27 0700   I.V. (mL/kg) 513.7 (4.6) 24 (0.2)   NG/GT 30    IV Piggyback 620 105   Total Intake(mL/kg) 1163.7 (10.4) 129 (1.2)   Urine (mL/kg/hr) 2035 (0.8) 115   Total Output 2035 115   Net -871.3 +14          Intake/Output Summary (Last 24 hours) at 11/30/11 0853 Last data filed at 11/30/11 0815  Gross per 24 hour  Intake 1014.03 ml  Output   2080 ml  Net -1065.97 ml   Foley:  9/23  A:  Hypokalemia and phosphatemia.  P:   -Replace K. -Recheck. -Lasix x3 doses today.  GASTROINTESTINAL  Lab 11/28/11 1440  AST 19  ALT 9  ALKPHOS 44  BILITOT 0.4  PROT 8.3  ALBUMIN 3.1*    A:   NPO  P:   -If unable to extubate today will start TF  today. -PPI.  HEMATOLOGIC  Lab 11/30/11 0504 11/29/11 1306 11/29/11 0455 11/28/11 1441 11/28/11 1440  HGB 11.0* 11.0* 11.0* -- 11.0*  HCT 34.5* 34.8* 34.4* -- 34.8*  PLT 86* 95* 88* -- 103*  INR -- -- -- 1.63* --  APTT -- -- -- -- --   A:   Chronic Coumadin - in setting of Afib  P:  -Anticoagulation as discussed above.  INFECTIOUS  Lab 11/30/11 0504 11/29/11 1306 11/29/11 0455 11/28/11 1441 11/28/11 1440  WBC 5.1 4.6 5.1 -- 5.6  PROCALCITON -- -- -- <0.10 --   Cultures: 9/23 Bcx2>>>NTD 9/23 UA>>>NTD 9/23 Sputum>>>NTD  Antibiotics: None  A:   No evidence of infection at this time.  Concerning for infection vs anniversary seizure.    P:   -Hold abx. -Follow cultures. -Follow fever curve, leukocytosis.  ENDOCRINE  Lab 11/30/11 0804 11/30/11 0339 11/29/11 2332 11/29/11 1952 11/29/11 1601  GLUCAP 97 91 94 102* 89   A:   Hx of DM - questionable diagnosis as pt is not on any home medications for DM    P:   -ICU SSI  CC time 35 min.  Alyson Reedy, M.D. [facility] Pulmonary/Critical Care Medicine. Pager: [phone]. After hours pager: [phone].

## 2011-11-30 NOTE — Progress Notes (Signed)
SLP received order for swallow eval. Pt still documented to be intubated in chart, likely only recently extubated. SLP will f/u tomorrow am for swallow eval. Harlon Ditty, MA CCC-SLP [phone]

## 2011-11-30 NOTE — Procedures (Signed)
EEG  This is a 78 y/o AAM with multiple strokes in the past was brought in for confusion and driving and  Was found to have seizures. EEG was done to rule out seizures  EEG: [place]  17 leads Photic Stimulation+   EEG begins with eyes closed. It is slow bihemispheric. Posterior dominant rhythm is poorly defined and modulated it is also of low amplitude. Photic stimulation did not provide driving response Drowsiness was captured but stage 2 sleep was not captured   Interpretation: This is a normal diffusely slow  EEG without any evidence of seizures. This kind of EEG are seen during drowsiness and or early stages of sleep or some loss of cortical functions Clinical Correlation is highly suggested  Terry Robertson V-P Eilleen Kempf., MD., Ph.D.,MS 11/30/2011 1:57 PM

## 2011-11-30 NOTE — Progress Notes (Signed)
Nursing 1100 On RN rounds patient experiencing noisy breathing.  Patient reports that his throat is sore. RN auscultated breath sounds and heard stridor.  Patient is maintaining oxygen saturations on nasal cannula.  RT made aware.  RT assessed patient and notified MD of stridor.  Breathing treatment ordered and given by RT.  Patient reported improvement.  Will continue to assess.

## 2011-11-30 NOTE — Procedures (Signed)
Intubation Procedure Note Terry Robertson [id number] [birth year]  Procedure: Intubation Indications: Respiratory insufficiency  Procedure Details Consent: Risks of procedure as well as the alternatives and risks of each were explained to the (patient/caregiver).  Consent for procedure obtained. Time Out: Verified patient identification, verified procedure, site/side was marked, verified correct patient position, special equipment/implants available, medications/allergies/relevent history reviewed, required imaging and test results available.  Performed  Maximum sterile technique was used including antiseptics, gloves, hand hygiene and mask.  MAC    Evaluation Hemodynamic Status: BP stable throughout; O2 sats: stable throughout Patient's Current Condition: stable Complications: No apparent complications Patient did tolerate procedure well. Chest X-ray ordered to verify placement.  CXR: tube position acceptable.   Terry Robertson 11/30/2011

## 2011-11-30 NOTE — Progress Notes (Signed)
Nursing 1500 Patient intubated and central line placed.  RN monitored patient vital signs during procedures.  Medications given during procedure include Fentanyl IV, Versed 6mg  IV, Etomidate 35mg  IV, and Rocuronium 80mg  IV.  Medications given per Dr. Molli Knock.  PCXR ordered s/p procedures and confirmed by MD.  Family updated by MD.

## 2011-11-30 NOTE — Progress Notes (Signed)
ANTICOAGULATION CONSULT NOTE - Follow Up Consult  Pharmacy Consult for heparin Indication: atrial fibrillation  Labs:  Basename 11/30/11 0504 11/29/11 2044 11/29/11 1306 11/29/11 0455 11/28/11 1441 11/28/11 1440  HGB -- -- 11.0* 11.0* -- --  HCT -- -- 34.8* 34.4* -- 34.8*  PLT -- -- 95* 88* -- 103*  APTT -- -- -- -- -- --  LABPROT -- -- -- -- 18.8* --  INR -- -- -- -- 1.63* --  HEPARINUNFRC 0.14* 0.12* -- -- -- --  CREATININE -- -- -- 0.84 -- 0.87  CKTOTAL -- -- -- -- -- --  CKMB -- -- -- -- -- --  TROPONINI -- -- -- -- <0.30 --    Assessment: 78yo male remains subtherapeutic on heparin with very little movement in level despite rate increase of 2 units/kg/hr.  Goal of Therapy:  Heparin level 0.3-0.5 units/ml   Plan:  Will increase heparin gtt by 3 units/kg/hr to 1400 units/hr and check level in 6hr.  Colleen Can PharmD BCPS 11/30/2011,6:06 AM

## 2011-11-30 NOTE — Progress Notes (Signed)
ANTICOAGULATION CONSULT NOTE - Follow Up Consult  Pharmacy Consult for heparin Indication: atrial fibrillation  Labs:  Basename 11/30/11 2300 11/30/11 1742 11/30/11 0504 11/29/11 2044 11/29/11 1306 11/29/11 0455 11/28/11 1441  HGB -- -- 11.0* -- 11.0* -- --  HCT -- -- 34.5* -- 34.8* 34.4* --  PLT -- -- 86* -- 95* 88* --  APTT -- -- -- -- -- -- --  LABPROT -- -- -- -- -- -- 18.8*  INR -- -- -- -- -- -- 1.63*  HEPARINUNFRC 0.25* -- 0.14* 0.12* -- -- --  CREATININE -- 0.92 0.98 -- -- 0.84 --  CKTOTAL -- -- -- -- -- -- --  CKMB -- -- -- -- -- -- --  TROPONINI -- -- -- -- -- -- <0.30    Assessment: 78yo male remains subtherapeutic on heparin after rate increase though level now close to goal.  Goal of Therapy:  Heparin level 0.3-0.5 units/ml   Plan:  Will increase heparin gtt by 1 unit/kghr to 1500 units/hr and check level in 8hr.  Vernard Gambles, PharmD, BCPS  11/30/2011,11:47 PM

## 2011-11-30 NOTE — Progress Notes (Signed)
Nursing 1400 Dr. Eilleen Kempf made aware that patient is having difficulty with speech, perseverating and some aphasia.  Dr. Eilleen Kempf to bedside to assess, appears to be baseline speech per MD.  No new orders.

## 2011-11-30 NOTE — Progress Notes (Signed)
Nursing 1732 Patient had 10 beat run of SVT, Dr. Tyson Alias made aware.  Order for labs.  Labs sent, will continue to assess.

## 2011-11-30 NOTE — Procedures (Signed)
Central Venous Catheter Insertion Procedure Note Jyaire Koudelka [id number] 1928/03/27  Procedure: Insertion of Central Venous Catheter Indications: Assessment of intravascular volume, Drug and/or fluid administration and Frequent blood sampling  Procedure Details Consent: Risks of procedure as well as the alternatives and risks of each were explained to the (patient/caregiver).  Consent for procedure obtained. Time Out: Verified patient identification, verified procedure, site/side was marked, verified correct patient position, special equipment/implants available, medications/allergies/relevent history reviewed, required imaging and test results available.  Performed  Maximum sterile technique was used including antiseptics, cap, gloves, gown, hand hygiene, mask and sheet. Skin prep: Chlorhexidine; local anesthetic administered A antimicrobial bonded/coated triple lumen catheter was placed in the right internal jugular vein using the Seldinger technique.  Evaluation Blood flow good Complications: No apparent complications Patient did tolerate procedure well. Chest X-ray ordered to verify placement.  CXR: pending.  U/S used in placement.  YACOUB,WESAM 11/30/2011, 3:10 PM

## 2011-11-30 NOTE — Progress Notes (Signed)
Nursing 1400 Dr. Molli Knock made aware that patient continues to have stridor and increased work of breathing s/p breathing treatments.  Respiratory rate in the 30s and oxygen saturations maintained on nasal cannula.  Patient reports slight improvement in breathing.  MD to bedside to assess patient.

## 2011-11-30 NOTE — Progress Notes (Signed)
ANTICOAGULATION CONSULT NOTE - Follow Up Consult  Pharmacy Consult for Heparin Indication: atrial fibrillation  Labs:  Basename 11/30/11 0504 11/29/11 2044 11/29/11 1306 11/29/11 0455 11/28/11 1441 11/28/11 1440  HGB 11.0* -- 11.0* -- -- --  HCT 34.5* -- 34.8* 34.4* -- --  PLT 86* -- 95* 88* -- --  APTT -- -- -- -- -- --  LABPROT -- -- -- -- 18.8* --  INR -- -- -- -- 1.63* --  HEPARINUNFRC 0.14* 0.12* -- -- -- --  CREATININE 0.98 -- -- 0.84 -- 0.87  CKTOTAL -- -- -- -- -- --  CKMB -- -- -- -- -- --  TROPONINI -- -- -- -- <0.30 --    Assessment:  Subtherapeutic heparin level early this am on 1100 units/hr; Heparin drip was increased to 1400 units/hr ~7am.   Heparin level planned for ~ 6 hrs after increase was cancelled, due to need to hold heparin drip for re-inubation and new central line.  Drip resumed at 5pm, about 1 hour after new line placed. No bleeding reported.  Goal of Therapy:  Heparin level 0.3-0.5 units/ml   Plan:   Continue heparin at 1400 units/hr.  Next heparin level at 11pm, ~ 6 hours after drip resumed.  Continue daily heparin level and CBC.  Dennie Fetters, [place] Pager: [phone] 11/30/2011,6:19 PM

## 2011-12-01 ENCOUNTER — Inpatient Hospital Stay (HOSPITAL_COMMUNITY): Payer: Medicare Other

## 2011-12-01 LAB — BASIC METABOLIC PANEL
BUN: 15 mg/dL (ref 6–23)
CO2: 23 mEq/L (ref 19–32)
Calcium: 8.9 mg/dL (ref 8.4–10.5)
Chloride: 109 mEq/L (ref 96–112)
Creatinine, Ser: 1.31 mg/dL (ref 0.50–1.35)

## 2011-12-01 LAB — URINE CULTURE
Culture: NO GROWTH
Special Requests: NORMAL

## 2011-12-01 LAB — GLUCOSE, CAPILLARY
Glucose-Capillary: 147 mg/dL — ABNORMAL HIGH (ref 70–99)
Glucose-Capillary: 147 mg/dL — ABNORMAL HIGH (ref 70–99)
Glucose-Capillary: 161 mg/dL — ABNORMAL HIGH (ref 70–99)

## 2011-12-01 LAB — MAGNESIUM: Magnesium: 2.5 mg/dL (ref 1.5–2.5)

## 2011-12-01 LAB — CBC
HCT: 40 % (ref 39.0–52.0)
MCH: 26.1 pg (ref 26.0–34.0)
MCV: 84 fL (ref 78.0–100.0)
Platelets: 93 10*3/uL — ABNORMAL LOW (ref 150–400)
RBC: 4.76 MIL/uL (ref 4.22–5.81)
WBC: 8.2 10*3/uL (ref 4.0–10.5)

## 2011-12-01 LAB — TRIGLYCERIDES: Triglycerides: 95 mg/dL (ref <150)

## 2011-12-01 LAB — HEPARIN LEVEL (UNFRACTIONATED): Heparin Unfractionated: 0.47 IU/mL (ref 0.30–0.70)

## 2011-12-01 MED ORDER — WARFARIN - PHARMACIST DOSING INPATIENT: Freq: Every day | Status: DC

## 2011-12-01 MED ORDER — POTASSIUM CHLORIDE 20 MEQ/15ML (10%) PO LIQD
40.0000 meq | Freq: Three times a day (TID) | ORAL | Status: AC
  Administered 2011-12-01 (×2): 40 meq
  Filled 2011-12-01 (×2): qty 30

## 2011-12-01 MED ORDER — FENTANYL CITRATE 0.05 MG/ML IJ SOLN
25.0000 ug | INTRAMUSCULAR | Status: DC | PRN
  Administered 2011-12-01 – 2011-12-03 (×12): 50 ug via INTRAVENOUS
  Filled 2011-12-01 (×11): qty 2

## 2011-12-01 MED ORDER — POTASSIUM CHLORIDE 20 MEQ/15ML (10%) PO LIQD
40.0000 meq | ORAL | Status: AC
  Administered 2011-12-01 (×2): 40 meq
  Filled 2011-12-01 (×2): qty 30

## 2011-12-01 MED ORDER — SODIUM CHLORIDE 0.9 % IV SOLN
1500.0000 mg | INTRAVENOUS | Status: DC
  Filled 2011-12-01: qty 1500

## 2011-12-01 MED ORDER — MIDAZOLAM HCL 2 MG/2ML IJ SOLN
1.0000 mg | INTRAMUSCULAR | Status: DC | PRN
  Administered 2011-12-01 – 2011-12-04 (×10): 2 mg via INTRAVENOUS
  Filled 2011-12-01 (×10): qty 2

## 2011-12-01 MED ORDER — WARFARIN SODIUM 10 MG PO TABS
10.0000 mg | ORAL_TABLET | Freq: Once | ORAL | Status: DC
  Filled 2011-12-01: qty 1

## 2011-12-01 MED ORDER — PRO-STAT SUGAR FREE PO LIQD
30.0000 mL | Freq: Four times a day (QID) | ORAL | Status: DC
  Administered 2011-12-01 – 2011-12-03 (×11): 30 mL
  Filled 2011-12-01 (×15): qty 30

## 2011-12-01 MED ORDER — VANCOMYCIN HCL 1000 MG IV SOLR
2000.0000 mg | Freq: Once | INTRAVENOUS | Status: AC
  Administered 2011-12-01: 2000 mg via INTRAVENOUS
  Filled 2011-12-01: qty 2000

## 2011-12-01 MED ORDER — JEVITY 1.2 CAL PO LIQD
1000.0000 mL | ORAL | Status: DC
  Administered 2011-12-01 – 2011-12-03 (×3): 1000 mL
  Filled 2011-12-01 (×6): qty 1000

## 2011-12-01 MED ORDER — FUROSEMIDE 10 MG/ML IJ SOLN
40.0000 mg | Freq: Three times a day (TID) | INTRAMUSCULAR | Status: AC
  Administered 2011-12-01 (×2): 40 mg via INTRAVENOUS
  Filled 2011-12-01 (×2): qty 4

## 2011-12-01 NOTE — Progress Notes (Signed)
Nutrition Follow-up Consult for tube feeding initiation and management  Intervention:  1. Initiate TF of Jevity 1.2 @ 20 ml/hr and increase by 10 ml every 4 hours to a goal rate of 40 ml/hr plus prostat 4 times daily. Total enteral nutrition to provide 1552 kcal, 113 grams protein, and 775 ml free water. (Meets 100% of estimated calorie needs based on ASPEN guidelines and 97% of estimated protein needs.)  Assessment:   Spoke with RN, per RN patient with difficult extubation yesterday, patient now re-intubated. OGT in place.    Diet Order:  NPO  Meds: Scheduled Meds:   . antiseptic oral rinse  15 mL Mouth Rinse QID  . chlorhexidine  15 mL Mouth Rinse BID  . etomidate  35 mg Intravenous Once  . fentaNYL      . fentaNYL      . fentaNYL  300 mcg Intravenous Once  . furosemide  40 mg Intravenous Q6H  . influenza  inactive virus vaccine  0.5 mL Intramuscular Tomorrow-1000  . insulin aspart  2-6 Units Subcutaneous Q4H  . levetiracetam  500 mg Intravenous Q12H  . methylPREDNISolone (SOLU-MEDROL) injection  60 mg Intravenous Q6H  . metoprolol tartrate  12.5 mg Per Tube BID  . midazolam      . midazolam      . midazolam  6 mg Intravenous Once  . pantoprazole (PROTONIX) IV  40 mg Intravenous QHS  . pneumococcal 23 valent vaccine  0.5 mL Intramuscular Tomorrow-1000  . potassium chloride  40 mEq Per Tube TID  . potassium chloride  40 mEq Per Tube Q4H  . potassium chloride  40 mEq Per Tube Q4H  . Racepinephrine HCl  0.5 mL Nebulization Once  . rocuronium  80 mg Intravenous Once  . DISCONTD: feeding supplement (JEVITY 1.2 CAL)  1,000 mL Per Tube Q24H   Continuous Infusions:   . fentaNYL infusion INTRAVENOUS 50 mcg/hr (12/01/11 0800)  . heparin 1,500 Units/hr (12/01/11 0800)  . propofol 9.988 mcg/kg/min (12/01/11 0800)  . DISCONTD: fentaNYL infusion INTRAVENOUS Stopped (11/30/11 0815)  . DISCONTD: propofol Stopped (11/30/11 0815)   PRN Meds:.sodium chloride, acetaminophen, acetaminophen,  acetaminophen, fentaNYL, DISCONTD: fentaNYL  Labs:  CMP     Component Value Date/Time   NA 142 12/01/2011 0400   K 4.6 12/01/2011 0400   CL 109 12/01/2011 0400   CO2 23 12/01/2011 0400   GLUCOSE 158* 12/01/2011 0400   BUN 15 12/01/2011 0400   CREATININE 1.31 12/01/2011 0400   CALCIUM 8.9 12/01/2011 0400   PROT 8.3 11/28/2011 1440   ALBUMIN 3.1* 11/28/2011 1440   AST 19 11/28/2011 1440   ALT 9 11/28/2011 1440   ALKPHOS 44 11/28/2011 1440   BILITOT 0.4 11/28/2011 1440   GFRNONAA 49* 12/01/2011 0400   GFRAA 57* 12/01/2011 0400     Intake/Output Summary (Last 24 hours) at 12/01/11 0925 Last data filed at 12/01/11 0806  Gross per 24 hour  Intake    849 ml  Output   4320 ml  Net  -3471 ml   Wt Readings from Last 10 Encounters:  12/01/11 232 lb 5.8 oz (105.4 kg)  08/14/11 236 lb 15.9 oz (107.5 kg)  06/14/11 259 lb 14.4 oz (117.89 kg)    Weight Status:  232 lb. Weight down a little below pt UBW of 107-117 kg.   Re-estimated needs:  Enteral nutrition to provide 60-70% of estimated calorie needs (22-25 kcals/kg ideal body weight) and >/= 90% of estimated protein needs, based on ASPEN guidelines for  permissive underfeeding in critically ill obese individuals. 1914-7829 kcal 116-137 grams protein    Nutrition Dx:  Inadequate Oral Intake, Ongoing.   Goal:  Meet > 90% of estimated energy needs with nutrition support.   Monitor:  Extubation, diet advancements/ tolerance, weights, labs   Adron Bene [phone]

## 2011-12-01 NOTE — Progress Notes (Signed)
ANTIBIOTIC CONSULT NOTE - INITIAL  Pharmacy Consult for Vancomycin Indication: rule out bacteremia  No Known Allergies  Patient Measurements: Height: 5\' 5"  (165.1 cm) Weight: 232 lb 5.8 oz (105.4 kg) IBW/kg (Calculated) : 61.5   Vital Signs: Temp: 99.5 F (37.5 C) (09/27 1900) Temp src: Other (Comment) (09/27 1900) BP: 148/78 mmHg (09/27 1900) Pulse Rate: 80  (09/27 1900) Intake/Output from previous day: 09/26 0701 - 09/27 0700 In: 790.3 [I.V.:562.3; IV Piggyback:228] Out: 4360 [Urine:4360] Intake/Output from this shift:    Labs:  Basename 12/01/11 0400 11/30/11 1742 11/30/11 0504 11/29/11 1306  WBC 8.2 -- 5.1 4.6  HGB 12.4* -- 11.0* 11.0*  PLT 93* -- 86* 95*  LABCREA -- -- -- --  CREATININE 1.31 0.92 0.98 --   Estimated Creatinine Clearance: 49.5 ml/min (by C-G formula based on Cr of 1.31). No results found for this basename: VANCOTROUGH:2,VANCOPEAK:2,VANCORANDOM:2,GENTTROUGH:2,GENTPEAK:2,GENTRANDOM:2,TOBRATROUGH:2,TOBRAPEAK:2,TOBRARND:2,AMIKACINPEAK:2,AMIKACINTROU:2,AMIKACIN:2, in the last 72 hours   Microbiology: Recent Results (from the past 720 hour(s))  CULTURE, RESPIRATORY     Status: Normal   Collection Time   11/28/11  3:00 PM      Component Value Range Status Comment   Specimen Description ENDOTRACHEAL   Final    Special Requests NONE   Final    Gram Stain     Final    Value: RARE WBC PRESENT, PREDOMINANTLY PMN     RARE SQUAMOUS EPITHELIAL CELLS PRESENT     NO ORGANISMS SEEN   Culture Non-Pathogenic Oropharyngeal-type Flora Isolated.   Final    Report Status 11/30/2011 FINAL   Final   MRSA PCR SCREENING     Status: Normal   Collection Time   11/28/11  3:45 PM      Component Value Range Status Comment   MRSA by PCR NEGATIVE  NEGATIVE Final   CULTURE, BLOOD (ROUTINE X 2)     Status: Normal (Preliminary result)   Collection Time   11/28/11  3:51 PM      Component Value Range Status Comment   Specimen Description BLOOD ARM LEFT   Final    Special  Requests BOTTLES DRAWN AEROBIC AND ANAEROBIC 10CC   Final    Culture  Setup Time 11/28/2011 20:13   Final    Culture     Final    Value:        BLOOD CULTURE RECEIVED NO GROWTH TO DATE CULTURE WILL BE HELD FOR 5 DAYS BEFORE ISSUING A FINAL NEGATIVE REPORT   Report Status PENDING   Incomplete   CULTURE, BLOOD (ROUTINE X 2)     Status: Normal (Preliminary result)   Collection Time   11/28/11  4:06 PM      Component Value Range Status Comment   Specimen Description BLOOD HAND LEFT   Final    Special Requests BOTTLES DRAWN AEROBIC AND ANAEROBIC 10CC   Final    Culture  Setup Time 11/28/2011 20:13   Final    Culture     Final    Value:        BLOOD CULTURE RECEIVED NO GROWTH TO DATE CULTURE WILL BE HELD FOR 5 DAYS BEFORE ISSUING A FINAL NEGATIVE REPORT   Report Status PENDING   Incomplete   CULTURE, BLOOD (ROUTINE X 2)     Status: Normal (Preliminary result)   Collection Time   11/30/11  3:40 PM      Component Value Range Status Comment   Specimen Description BLOOD CENTRAL LINE   Final    Special Requests BOTTLES DRAWN AEROBIC  AND ANAEROBIC 10CC   Final    Culture  Setup Time 12/01/2011 00:17   Final    Culture     Final    Value: GRAM POSITIVE COCCI IN CLUSTERS     27 Note: Gram Stain Report Called to,Read Back By and Verified With: ANDREW CHAMKASEM AT 6:28PM 9 13 BY THOMI   Report Status PENDING   Incomplete     Medical History: Past Medical History  Diagnosis Date  . Coronary artery disease   . Diabetes mellitus   . Hypertension   . Stroke     Medications:  Scheduled:    . antiseptic oral rinse  15 mL Mouth Rinse QID  . chlorhexidine  15 mL Mouth Rinse BID  . feeding supplement (JEVITY 1.2 CAL)  1,000 mL Per Tube Q24H  . feeding supplement  30 mL Per Tube QID  . furosemide  40 mg Intravenous Q6H  . furosemide  40 mg Intravenous Q8H  . insulin aspart  2-6 Units Subcutaneous Q4H  . levetiracetam  500 mg Intravenous Q12H  . methylPREDNISolone (SOLU-MEDROL) injection  60 mg  Intravenous Q6H  . metoprolol tartrate  12.5 mg Per Tube BID  . pantoprazole (PROTONIX) IV  40 mg Intravenous QHS  . potassium chloride  40 mEq Per Tube TID  . potassium chloride  40 mEq Per Tube Q4H  . potassium chloride  40 mEq Per Tube Q4H  . potassium chloride  40 mEq Per Tube TID  . DISCONTD: warfarin  10 mg Oral ONCE-1800  . DISCONTD: Warfarin - Pharmacist Dosing Inpatient   Does not apply q1800   Assessment: Terry Robertson is known to pharmacy from anticoagulation management, noted Coumadin held d/t ETT bleeding, with low threshold to hold heparin if bleeding continues. Received orders to start Vancomycin empirically for 1/2 GPC. 9/26 Blood: 1/2 GPC 9/24: Resp: oral flora 9/24: Blood: ngtd 9/24: MRSA PCR: neg   Goal of Therapy:  Vancomycin trough level 15-20 mcg/ml  Plan:  - Vancomycin 2gm IV now, then 1500mg  IV q24h - Will monitor cx/spec/sens, renal fn and clinical status daily.  Thanks, Trei Schoch K. Allena Katz, PharmD, BCPS.  Clinical Pharmacist Pager [phone]. 12/01/2011 7:22 PM

## 2011-12-01 NOTE — Progress Notes (Signed)
Subjective: Intubated on small propofol drip ICU  Patient with multiple ischemic strokes in the past.  Now with seizures and respiratory failure.  Patient has not had any seizures since admission. He was extubated yesterday and reintubated today. Patient seen at the bedside, he is awake, alert and follows commands  Objective: Current vital signs: BP 128/69  Pulse 64  Temp 98.8 F (37.1 C) (Other (Comment))  Resp 16  Ht 5\' 5"  (1.651 m)  Wt 105.4 kg (232 lb 5.8 oz)  BMI 38.67 kg/m2  SpO2 95% Vital signs in last 24 hours: Temp:  [98.6 F (37 C)-102.7 F (39.3 C)] 98.8 F (37.1 C) (09/27 1000) Pulse Rate:  [64-111] 64  (09/27 1000) Resp:  [14-25] 16  (09/27 1000) BP: (106-173)/(59-96) 128/69 mmHg (09/27 1000) SpO2:  [92 %-100 %] 95 % (09/27 1000) FiO2 (%):  [40 %] 40 % (09/27 1000) Weight:  [105.4 kg (232 lb 5.8 oz)] 105.4 kg (232 lb 5.8 oz) (09/27 0400)  Intake/Output from previous day: 09/26 0701 - 09/27 0700 In: 790.3 [I.V.:562.3; IV Piggyback:228] Out: 4360 [Urine:4360] Intake/Output this shift: Total I/O In: 395.9 [I.V.:166.9; Other:120; IV Piggyback:109] Out: 125 [Urine:125] Nutritional status:    Neurologic Exam: Neurologic Exam:  AAO but is more drowsy today however, able to follow commands  Cranial Nerves: Pupils isocoria, reactive,  GAG intact  No facial droop  No facial hemisensory loss    Motor: able to move all the 4 extremities, right side weaker than the left side  This is patient's baseline,  Sensory deficits on the right side  Extrapyramidals: negative    Lab Results: Results for orders placed during the hospital encounter of 11/28/11 (from the past 48 hour(s))  GLUCOSE, CAPILLARY     Status: Normal   Collection Time   11/29/11 11:47 AM      Component Value Range Comment   Glucose-Capillary 94  70 - 99 mg/dL   CBC     Status: Abnormal   Collection Time   11/29/11  1:06 PM      Component Value Range Comment   WBC 4.6  4.0 - 10.5 K/uL    RBC 4.15 (*) 4.22 - 5.81 MIL/uL    Hemoglobin 11.0 (*) 13.0 - 17.0 g/dL    HCT 40.9 (*) 81.1 - 52.0 %    MCV 83.9  78.0 - 100.0 fL    MCH 26.5  26.0 - 34.0 pg    MCHC 31.6  30.0 - 36.0 g/dL    RDW 91.4  78.2 - 95.6 %    Platelets 95 (*) 150 - 400 K/uL CONSISTENT WITH PREVIOUS RESULT  GLUCOSE, CAPILLARY     Status: Normal   Collection Time   11/29/11  4:01 PM      Component Value Range Comment   Glucose-Capillary 89  70 - 99 mg/dL    Comment 1 Notify RN      Comment 2 Documented in Chart     GLUCOSE, CAPILLARY     Status: Abnormal   Collection Time   11/29/11  7:52 PM      Component Value Range Comment   Glucose-Capillary 102 (*) 70 - 99 mg/dL    Comment 1 Notify RN      Comment 2 Documented in Chart     HEPARIN LEVEL (UNFRACTIONATED)     Status: Abnormal   Collection Time   11/29/11  8:44 PM      Component Value Range Comment   Heparin Unfractionated 0.12 (*) 0.30 -  0.70 IU/mL   GLUCOSE, CAPILLARY     Status: Normal   Collection Time   11/29/11 11:32 PM      Component Value Range Comment   Glucose-Capillary 94  70 - 99 mg/dL   GLUCOSE, CAPILLARY     Status: Normal   Collection Time   11/30/11  3:39 AM      Component Value Range Comment   Glucose-Capillary 91  70 - 99 mg/dL   CBC     Status: Abnormal   Collection Time   11/30/11  5:04 AM      Component Value Range Comment   WBC 5.1  4.0 - 10.5 K/uL    RBC 4.14 (*) 4.22 - 5.81 MIL/uL    Hemoglobin 11.0 (*) 13.0 - 17.0 g/dL    HCT 82.9 (*) 56.2 - 52.0 %    MCV 83.3  78.0 - 100.0 fL    MCH 26.6  26.0 - 34.0 pg    MCHC 31.9  30.0 - 36.0 g/dL    RDW 13.0  86.5 - 78.4 %    Platelets 86 (*) 150 - 400 K/uL CONSISTENT WITH PREVIOUS RESULT  BASIC METABOLIC PANEL     Status: Abnormal   Collection Time   11/30/11  5:04 AM      Component Value Range Comment   Sodium 141  135 - 145 mEq/L    Potassium 3.1 (*) 3.5 - 5.1 mEq/L    Chloride 107  96 - 112 mEq/L    CO2 23  19 - 32 mEq/L    Glucose, Bld 96  70 - 99 mg/dL    BUN 7  6 - 23  mg/dL    Creatinine, Ser 6.96  0.50 - 1.35 mg/dL    Calcium 8.5  8.4 - 29.5 mg/dL    GFR calc non Af Amer 75 (*) >90 mL/min    GFR calc Af Amer 87 (*) >90 mL/min   MAGNESIUM     Status: Normal   Collection Time   11/30/11  5:04 AM      Component Value Range Comment   Magnesium 2.2  1.5 - 2.5 mg/dL   PHOSPHORUS     Status: Normal   Collection Time   11/30/11  5:04 AM      Component Value Range Comment   Phosphorus 3.6  2.3 - 4.6 mg/dL   HEPARIN LEVEL (UNFRACTIONATED)     Status: Abnormal   Collection Time   11/30/11  5:04 AM      Component Value Range Comment   Heparin Unfractionated 0.14 (*) 0.30 - 0.70 IU/mL   GLUCOSE, CAPILLARY     Status: Normal   Collection Time   11/30/11  8:04 AM      Component Value Range Comment   Glucose-Capillary 97  70 - 99 mg/dL   GLUCOSE, CAPILLARY     Status: Abnormal   Collection Time   11/30/11 12:13 PM      Component Value Range Comment   Glucose-Capillary 105 (*) 70 - 99 mg/dL   GLUCOSE, CAPILLARY     Status: Abnormal   Collection Time   11/30/11  3:59 PM      Component Value Range Comment   Glucose-Capillary 101 (*) 70 - 99 mg/dL    Comment 1 Notify RN      Comment 2 Documented in Chart     URINALYSIS, ROUTINE W REFLEX MICROSCOPIC     Status: Abnormal   Collection Time   11/30/11  4:16  PM      Component Value Range Comment   Color, Urine YELLOW  YELLOW    APPearance CLOUDY (*) CLEAR    Specific Gravity, Urine 1.022  1.005 - 1.030    pH 5.5  5.0 - 8.0    Glucose, UA NEGATIVE  NEGATIVE mg/dL    Hgb urine dipstick MODERATE (*) NEGATIVE    Bilirubin Urine SMALL (*) NEGATIVE    Ketones, ur NEGATIVE  NEGATIVE mg/dL    Protein, ur 161 (*) NEGATIVE mg/dL    Urobilinogen, UA 1.0  0.0 - 1.0 mg/dL    Nitrite NEGATIVE  NEGATIVE    Leukocytes, UA NEGATIVE  NEGATIVE   URINE MICROSCOPIC-ADD ON     Status: Abnormal   Collection Time   11/30/11  4:16 PM      Component Value Range Comment   Squamous Epithelial / LPF RARE  RARE    WBC, UA 0-2  <3  WBC/hpf    RBC / HPF 3-6  <3 RBC/hpf    Bacteria, UA FEW (*) RARE    Casts HYALINE CASTS (*) NEGATIVE    Urine-Other MUCOUS PRESENT     BASIC METABOLIC PANEL     Status: Abnormal   Collection Time   11/30/11  5:42 PM      Component Value Range Comment   Sodium 140  135 - 145 mEq/L    Potassium 3.1 (*) 3.5 - 5.1 mEq/L    Chloride 105  96 - 112 mEq/L    CO2 23  19 - 32 mEq/L    Glucose, Bld 112 (*) 70 - 99 mg/dL    BUN 8  6 - 23 mg/dL    Creatinine, Ser 0.96  0.50 - 1.35 mg/dL    Calcium 8.8  8.4 - 04.5 mg/dL    GFR calc non Af Amer 77 (*) >90 mL/min    GFR calc Af Amer 89 (*) >90 mL/min   MAGNESIUM     Status: Normal   Collection Time   11/30/11  5:42 PM      Component Value Range Comment   Magnesium 2.1  1.5 - 2.5 mg/dL   PHOSPHORUS     Status: Normal   Collection Time   11/30/11  5:42 PM      Component Value Range Comment   Phosphorus 3.9  2.3 - 4.6 mg/dL   GLUCOSE, CAPILLARY     Status: Abnormal   Collection Time   11/30/11  7:55 PM      Component Value Range Comment   Glucose-Capillary 131 (*) 70 - 99 mg/dL    Comment 1 Notify RN      Comment 2 Documented in Chart     HEPARIN LEVEL (UNFRACTIONATED)     Status: Abnormal   Collection Time   11/30/11 11:00 PM      Component Value Range Comment   Heparin Unfractionated 0.25 (*) 0.30 - 0.70 IU/mL   GLUCOSE, CAPILLARY     Status: Abnormal   Collection Time   12/01/11 12:00 AM      Component Value Range Comment   Glucose-Capillary 161 (*) 70 - 99 mg/dL    Comment 1 Notify RN      Comment 2 Documented in Chart     GLUCOSE, CAPILLARY     Status: Abnormal   Collection Time   12/01/11  3:55 AM      Component Value Range Comment   Glucose-Capillary 145 (*) 70 - 99 mg/dL    Comment 1  Notify RN      Comment 2 Documented in Chart     CBC     Status: Abnormal   Collection Time   12/01/11  4:00 AM      Component Value Range Comment   WBC 8.2  4.0 - 10.5 K/uL    RBC 4.76  4.22 - 5.81 MIL/uL    Hemoglobin 12.4 (*) 13.0 - 17.0  g/dL    HCT 16.1  09.6 - 04.5 %    MCV 84.0  78.0 - 100.0 fL    MCH 26.1  26.0 - 34.0 pg    MCHC 31.0  30.0 - 36.0 g/dL    RDW 40.9  81.1 - 91.4 %    Platelets 93 (*) 150 - 400 K/uL CONSISTENT WITH PREVIOUS RESULT  BASIC METABOLIC PANEL     Status: Abnormal   Collection Time   12/01/11  4:00 AM      Component Value Range Comment   Sodium 142  135 - 145 mEq/L    Potassium 4.6  3.5 - 5.1 mEq/L    Chloride 109  96 - 112 mEq/L    CO2 23  19 - 32 mEq/L    Glucose, Bld 158 (*) 70 - 99 mg/dL    BUN 15  6 - 23 mg/dL    Creatinine, Ser 7.82  0.50 - 1.35 mg/dL    Calcium 8.9  8.4 - 95.6 mg/dL    GFR calc non Af Amer 49 (*) >90 mL/min    GFR calc Af Amer 57 (*) >90 mL/min   MAGNESIUM     Status: Normal   Collection Time   12/01/11  4:00 AM      Component Value Range Comment   Magnesium 2.5  1.5 - 2.5 mg/dL   PHOSPHORUS     Status: Normal   Collection Time   12/01/11  4:00 AM      Component Value Range Comment   Phosphorus 3.7  2.3 - 4.6 mg/dL   TRIGLYCERIDES     Status: Normal   Collection Time   12/01/11  4:00 AM      Component Value Range Comment   Triglycerides 95  <150 mg/dL   GLUCOSE, CAPILLARY     Status: Abnormal   Collection Time   12/01/11  7:47 AM      Component Value Range Comment   Glucose-Capillary 147 (*) 70 - 99 mg/dL   HEPARIN LEVEL (UNFRACTIONATED)     Status: Normal   Collection Time   12/01/11  8:22 AM      Component Value Range Comment   Heparin Unfractionated 0.37  0.30 - 0.70 IU/mL     Recent Results (from the past 240 hour(s))  CULTURE, RESPIRATORY     Status: Normal   Collection Time   11/28/11  3:00 PM      Component Value Range Status Comment   Specimen Description ENDOTRACHEAL   Final    Special Requests NONE   Final    Gram Stain     Final    Value: RARE WBC PRESENT, PREDOMINANTLY PMN     RARE SQUAMOUS EPITHELIAL CELLS PRESENT     NO ORGANISMS SEEN   Culture Non-Pathogenic Oropharyngeal-type Flora Isolated.   Final    Report Status 11/30/2011  FINAL   Final   MRSA PCR SCREENING     Status: Normal   Collection Time   11/28/11  3:45 PM      Component Value Range Status Comment  MRSA by PCR NEGATIVE  NEGATIVE Final   CULTURE, BLOOD (ROUTINE X 2)     Status: Normal (Preliminary result)   Collection Time   11/28/11  3:51 PM      Component Value Range Status Comment   Specimen Description BLOOD ARM LEFT   Final    Special Requests BOTTLES DRAWN AEROBIC AND ANAEROBIC 10CC   Final    Culture  Setup Time 11/28/2011 20:13   Final    Culture     Final    Value:        BLOOD CULTURE RECEIVED NO GROWTH TO DATE CULTURE WILL BE HELD FOR 5 DAYS BEFORE ISSUING A FINAL NEGATIVE REPORT   Report Status PENDING   Incomplete   CULTURE, BLOOD (ROUTINE X 2)     Status: Normal (Preliminary result)   Collection Time   11/28/11  4:06 PM      Component Value Range Status Comment   Specimen Description BLOOD HAND LEFT   Final    Special Requests BOTTLES DRAWN AEROBIC AND ANAEROBIC 10CC   Final    Culture  Setup Time 11/28/2011 20:13   Final    Culture     Final    Value:        BLOOD CULTURE RECEIVED NO GROWTH TO DATE CULTURE WILL BE HELD FOR 5 DAYS BEFORE ISSUING A FINAL NEGATIVE REPORT   Report Status PENDING   Incomplete     Lipid Panel  Basename 12/01/11 0400  CHOL --  TRIG 95  HDL --  CHOLHDL --  VLDL --  LDLCALC --    Studies/Results: Mr Brain Wo Contrast  11/29/2011  *RADIOLOGY REPORT*  Clinical Data: 78 year old with high blood pressure and history of stroke.  Recently noted to have difficulty with speech with right- sided gaze and right arm twitching.  MRI HEAD WITHOUT CONTRAST  Technique:  Multiplanar, multiecho pulse sequences of the brain and surrounding structures were obtained according to standard protocol without intravenous contrast.  Comparison: 11/28/2011 head CT.  08/09/2011 brain MR.  Findings: Remote left parietal lobe infarct with extension to the posterior aspect of the left opercular region. Laminar necrosis. T2 shine  through at this level on diffusion sequence without evidence of an acute infarct.  Tiny remote left cerebellar infarcts.  Remote small right caudate head/lenticular nucleus infarct.  Small vessel disease type changes.  No intracranial hemorrhage.  No intracranial mass lesion detected on this unenhanced exam.  Global atrophy without hydrocephalus.  Major intracranial vascular structures are patent.  Polypoid opacification inferior aspect right maxillary sinus.  Mild mucosal thickening left maxillary sinus, ethmoid sinus air cells and right frontal sinus.  The patient is intubated.  Slight narrowing of the upper cervical canal.  IMPRESSION:  Remote left parietal lobe infarct.  T2 shine through at this level on diffusion sequence without evidence of an acute infarct.   Original Report Authenticated By: Fuller Canada, M.D.    Dg Chest Port 1 View  12/01/2011  *RADIOLOGY REPORT*  Clinical Data: Ventilator dependent respiratory failure.  Follow up atelectasis.  PORTABLE CHEST - 1 VIEW  Comparison: Portable chest x-rays yesterday and 11/29/2011.  Findings: Endotracheal tube tip remains in satisfactory position projecting 3-4 cm above the carina.  Right jugular central venous catheter tip projects over the upper SVC, unchanged.  Nasogastric tube courses below the diaphragm into the stomach.  Cardiac silhouette enlarged but stable.  Mild pulmonary venous hypertension without overt edema.  Improved aeration in the lung bases, though  moderate atelectasis persists.  No new pulmonary parenchymal abnormalities.  IMPRESSION: Support apparatus satisfactory.  Improved aeration in the lung bases, with moderate atelectasis persisting.  No new abnormalities.   Original Report Authenticated By: Arnell Sieving, M.D.    Dg Chest Port 1 View  11/30/2011  *RADIOLOGY REPORT*  Clinical Data: Orogastric tube placement.  PORTABLE CHEST - 1 VIEW 4:30 p.m.  Comparison: 11/30/2011 at 3:50 p.m.  Findings: Endotracheal tube is 3.9 cm  above the carina.  Central line tip is in the superior vena cava.  Tip of the OG tube is below the diaphragm.  There is improved aeration at both lung bases since the prior study of the same date, with decreased atelectasis.  Heart size and vascularity are normal.  No effusions.  IMPRESSION: Improving bibasilar atelectasis.   Original Report Authenticated By: Gwynn Burly, M.D.    Dg Chest Port 1 View  11/30/2011  *RADIOLOGY REPORT*  Clinical Data: Endotracheal tube and central line placement  PORTABLE CHEST - 1 VIEW  Comparison: Portable exam 1550 hours compared to 0529 hours  Findings: Tip of endotracheal tube approximately 4.3 cm above carina. Right jugular central venous catheter, tip projecting over SVC. Nasogastric tube extends into stomach. Enlargement of cardiac silhouette. Atherosclerotic calcification aortic arch. Low lung volumes with bibasilar atelectasis. No pneumothorax or acute osseous findings.  IMPRESSION: Endotracheal tube and jugular line positions as above. No pneumothorax following central line placement. Enlargement of cardiac silhouette with bibasilar atelectasis.   Original Report Authenticated By: Lollie Marrow, M.D.    Dg Chest Port 1 View  11/30/2011  *RADIOLOGY REPORT*  Clinical Data: Evaluate endotracheal tube positioning  PORTABLE CHEST - 1 VIEW  Comparison: 11/29/2011; 11/28/2011; 08/05/2011  Findings:  Grossly unchanged enlarged cardiac silhouette and mediastinal contours.  Stable position of support apparatus.  Lung volumes remain persistently reduced with grossly unchanged perihilar and bibasilar heterogeneous opacities, left greater than right.  No new focal airspace opacities.  Mild cephalization of flow without frank evidence of pulmonary edema.  Query trace bilateral pleural effusions.  No pneumothorax.  Unchanged bones.  IMPRESSION: 1.  Stable positioning of support apparatus.  No pneumothorax. 2.  Persistently reduced lung volumes with grossly unchanged perihilar and  bibasilar opacities, atelectasis versus infiltrate.   Original Report Authenticated By: Waynard Reeds, M.D.    Dg Abd Portable 1v  11/30/2011  *RADIOLOGY REPORT*  Clinical Data: 78 year old male NG tube placement.  PORTABLE ABDOMEN - 1 VIEW  Comparison: None.  Findings: Portable supine AP view 1719 hours.  Mild respiratory motion artifact.  NG tube in place.  Tip at the level of the gastric body.  Gas filled but nondilated bowel loops.  Patchy opacity at both lung bases.  IMPRESSION: NG tube tip in the region of the body of the stomach.   Original Report Authenticated By: Harley Hallmark, M.D.     Medications:  Prior to Admission:  Prescriptions prior to admission  Medication Sig Dispense Refill  . aspirin EC 81 MG tablet Take 81 mg by mouth daily.      Marland Kitchen atenolol (TENORMIN) 12.5 mg TABS Take 0.5 tablets (12.5 mg total) by mouth daily.  303 tablet  5  . diltiazem (CARDIZEM CD) 180 MG 24 hr capsule Take 1 capsule (180 mg total) by mouth daily.  30 capsule  5  . lisinopril (PRINIVIL,ZESTRIL) 10 MG tablet Take 1 tablet (10 mg total) by mouth daily.  303 tablet  5  . omega-3 acid ethyl  esters (LOVAZA) 1 G capsule Take 2 g by mouth 2 (two) times daily.       . simvastatin (ZOCOR) 20 MG tablet Take 20 mg by mouth daily.      Marland Kitchen terazosin (HYTRIN) 2 MG capsule Take 2 mg by mouth daily.      Marland Kitchen warfarin (COUMADIN) 5 MG tablet Take 5 mg by mouth daily. 10 mg (2 tabs) on Monday, 7.5 mg (1 1/2 tabs) on all other days of the week      . nitroGLYCERIN (NITROSTAT) 0.4 MG SL tablet Place 1 tablet (0.4 mg total) under the tongue every 5 (five) minutes x 3 doses as needed for chest pain.  25 tablet  5    Assessment/Plan:  Patient with seizures secondary to multiple etiology including multilobe old infarcts, no acute stroke Now with respiratory failure. EEG negative for seizures, no seizures since admissions.  No further Neurology work up. Will sign off, if needed please call, discussed with ICU  team  Spent approximately 30 minutes of critical care time, reviewing charts, medications, labs and images  Jurnei Latini V-P Eilleen Kempf., MD., Ph.D.,MS 12/01/2011 11:17 AM

## 2011-12-01 NOTE — Progress Notes (Signed)
Pt placed back on rate for aggitation 

## 2011-12-01 NOTE — Progress Notes (Signed)
Name: Terry Robertson MRN: [id number] DOB: 1928-08-05    LOS: 3  Referring Provider:  EDP -  Reason for Referral:    PULMONARY / CRITICAL CARE MEDICINE  HPI:  78 y/o M with PMH of CAD, HTN, CVA, ? DM (on no meds) who was noted on 9/23 to be driving erratically, went home and EMS was notified and found him sitting in a chair unable to speak but awake with right sided gaze, right arm twitching.  In ED upon arrival, was noted to have seizure. Further decompensated and required intubation per EDP.  Concern for new CVA.  PCCM consulted for ICU admit.     Vital Signs: Temp:  [98.6 F (37 C)-102.7 F (39.3 C)] 98.8 F (37.1 C) (09/27 0900) Pulse Rate:  [66-111] 73  (09/27 0900) Resp:  [14-25] 17  (09/27 0900) BP: (106-173)/(59-96) 140/60 mmHg (09/27 0900) SpO2:  [92 %-100 %] 93 % (09/27 0900) FiO2 (%):  [40 %] 40 % (09/27 0900) Weight:  [105.4 kg (232 lb 5.8 oz)] 105.4 kg (232 lb 5.8 oz) (09/27 0400)  Physical Examination: General:  Elderly male in NAD Neuro:  Sedated on vent HEENT:  OETT, mm pink/moist Cardiovascular:  s1s2 rrr, no m/r/g Lungs:  resp's even/non-labored on vent, breath sounds diminished bilaterally Abdomen:  Round/soft, bsx4 active Musculoskeletal:  No acute deformities Skin:  Warm/dry, no edema  Active Problems:  Acute respiratory failure  Altered mental status  Seizure  Status epilepticus, generalized convulsive   ASSESSMENT AND PLAN  NEUROLOGIC  A:   Acute Seizure - concern for new CVA.  Initial 9/24 CT negative for acute CVA.   Altered Mental Status   P:   -Change propofol to PRN versed/fentanyl. -Keppra load and dosing per neuro's recommendations. -Neurology Consult appreciated. -EEG noted no seizure. -Heparin drip and restart coumadin 9/27. -MRI noted no new strokes 57 old ones however.  PULMONARY  Lab 11/29/11 0402 11/28/11 1456  PHART 7.427 7.350  PCO2ART 33.3* 36.6  PO2ART 87.0 119.0*  HCO3 21.6 20.2  O2SAT 96.7 98.0   Ventilator  Settings: Vent Mode:  [-] PSV;CPAP FiO2 (%):  [40 %] 40 % Set Rate:  [14 bmp] 14 bmp Vt Set:  [500 mL] 500 mL PEEP:  [5 cmH20] 5 cmH20 Pressure Support:  [10 cmH20] 10 cmH20 Plateau Pressure:  [18 cmH20-19 cmH20] 19 cmH20 CXR:  9/23>>>  ETT:  9/23>>>9/26>>>9/26>>>  A:   Acute Respiratory Failure -in setting of neurological process but now due to upper airway obstruction, patient was a very difficult airway and multiple attempts at intubation in the ED that resulted in significant upper airway trauma.  Was extubated on 9/26 but had to be reintubated due to stridor.  Upon intubating the patient it was noted that there was significant edema in the upper airway resulting in upper airway compromise.  P:   -Maintain on PS but allow for a few days while steroids, diureses and healing hopefully address upper airway trauma. -Change propofol to intermittent sedation. -Additional lasix today. -Will need cuff leak prior to extubation given traumatic intubation.  CARDIOVASCULAR  Lab 11/28/11 1441  TROPONINI <0.30  LATICACIDVEN --  PROBNP --   ECG:   Lines:    A:  Hx of Afib - diagnosed in 08/2011 as new onset.   Hx of HTN Hx of HLD  P:  -Start coumadin per pharmacy. -Full dose heparin until INR is 2-3. -Metoprolol started and well tolerated. -Hold zocor. -KVO IVF. -Diureses as below.  RENAL  Lab  12/01/11 0400 11/30/11 1742 11/30/11 0504 11/29/11 0455 11/28/11 1440  NA 142 140 141 140 137  K 4.6 3.1* -- -- --  CL 109 105 107 110 104  CO2 23 23 23 21 19   BUN 15 8 7 9 11   CREATININE 1.31 0.92 0.98 0.84 0.87  CALCIUM 8.9 8.8 8.5 8.6 8.1*  MG 2.5 2.1 2.2 2.1 2.1  PHOS 3.7 3.9 3.6 1.9* --   Intake/Output      09/26 0701 - 09/27 0700 09/27 0701 - 09/28 0700   I.V. (mL/kg) 562.3 (5.3) 96.8 (0.9)   Other  60   NG/GT     IV Piggyback 228 105   Total Intake(mL/kg) 790.3 (7.5) 261.8 (2.5)   Urine (mL/kg/hr) 4360 (1.7) 125   Total Output 4360 125   Net -3569.7 +136.8         Stool Occurrence 1 x      Intake/Output Summary (Last 24 hours) at 12/01/11 0944 Last data filed at 12/01/11 0900  Gross per 24 hour  Intake  899.1 ml  Output   4370 ml  Net -3470.9 ml   Foley:  9/23  A:  Hypokalemia.  P:   -Replace K. -Recheck. -Lasix x2 doses today.  GASTROINTESTINAL  Lab 11/28/11 1440  AST 19  ALT 9  ALKPHOS 44  BILITOT 0.4  PROT 8.3  ALBUMIN 3.1*   A:   TF  P:   -Continue TF. -PPI.  HEMATOLOGIC  Lab 12/01/11 0400 11/30/11 0504 11/29/11 1306 11/29/11 0455 11/28/11 1441 11/28/11 1440  HGB 12.4* 11.0* 11.0* 11.0* -- 11.0*  HCT 40.0 34.5* 34.8* 34.4* -- 34.8*  PLT 93* 86* 95* 88* -- 103*  INR -- -- -- -- 1.63* --  APTT -- -- -- -- -- --   A:   Chronic Coumadin - in setting of Afib  P:  -Anticoagulation as discussed above.  INFECTIOUS  Lab 12/01/11 0400 11/30/11 0504 11/29/11 1306 11/29/11 0455 11/28/11 1441 11/28/11 1440  WBC 8.2 5.1 4.6 5.1 -- 5.6  PROCALCITON -- -- -- -- <0.10 --   Cultures: 9/23 Bcx2>>>NTD 9/23 UA>>>NTD 9/23 Sputum>>>NTD 9/26 Blood>>> 9/26 Urine>>>  Antibiotics: None  A:   No evidence of infection at this time.  Concerning for infection vs anniversary seizure.    P:   -Hold abx. -Follow cultures. -Follow fever curve, leukocytosis.  ENDOCRINE  Lab 12/01/11 0747 12/01/11 0355 12/01/11 11/30/11 1955 11/30/11 1559  GLUCAP 147* 145* 161* 131* 101*   A:   Hx of DM - questionable diagnosis as pt is not on any home medications for DM    P:   -ICU SSI  Family updated bedside.  CC time 35 min.  Alyson Reedy, M.D. [facility]. Pager: [phone]. After hours pager: [phone].

## 2011-12-01 NOTE — Progress Notes (Signed)
ANTICOAGULATION CONSULT NOTE - Follow Up Consult  Pharmacy Consult for heparin, Coumadin Indication: atrial fibrillation  Labs:  Basename 12/01/11 1524 12/01/11 0822 12/01/11 0400 11/30/11 2300 11/30/11 1742 11/30/11 0504 11/29/11 1306  HGB -- -- 12.4* -- -- 11.0* --  HCT -- -- 40.0 -- -- 34.5* 34.8*  PLT -- -- 93* -- -- 86* 95*  APTT -- -- -- -- -- -- --  LABPROT -- -- -- -- -- -- --  INR -- -- -- -- -- -- --  HEPARINUNFRC 0.47 0.37 -- 0.25* -- -- --  CREATININE -- -- 1.31 -- 0.92 0.98 --  CKTOTAL -- -- -- -- -- -- --  CKMB -- -- -- -- -- -- --  TROPONINI -- -- -- -- -- -- --    Assessment: Mr. Glantz continues on heparin drip for Afib, bridging to Coumadin. Heparin level is therapeutic, confirmed with repeat check.  Goal of Therapy:  Heparin level 0.3-0.5 units/ml   Plan:  Continue heparin at 1500 units/hr. F/up AM heparin level  Thanks, Carolee Channell K. Allena Katz, PharmD, BCPS.  Clinical Pharmacist Pager [phone]. 12/01/2011 4:50 PM

## 2011-12-01 NOTE — Progress Notes (Signed)
eLink Physician-Brief Progress Note Patient Name: Lavell Supple DOB: [birth year] MRN: [id number]  Date of Service  12/01/2011   HPI/Events of Note  Bleeding from ETT GPC clusters 1/2 ? contaminant   eICU Interventions  Hold coumadin Ct heparin  - low threshold to stop x few hr if bleeding continues Vanc until cx resulted   Intervention Category Intermediate Interventions: Other:  ALVA,RAKESH V. 12/01/2011, 6:56 PM

## 2011-12-01 NOTE — Progress Notes (Signed)
Pt still intubated this am. Will keep pt on caseload to evaluate swallow function post extubation. Harlon Ditty, MA CCC-SLP [phone]

## 2011-12-01 NOTE — Progress Notes (Signed)
Nursing  Patient continues to have blood in sputum s/p suctioning.  Dr. Vassie Loll made aware.  Order to discontinue today's dose of Coumadin.  Pharmacy made aware that patient did not receive Coumadin dose.  Will continue to reassess as patient is receiving Heparin IV.

## 2011-12-01 NOTE — Progress Notes (Signed)
CRITICAL VALUE ALERT  Critical value received:  Gram + cocci in clusters  Date of notification:  12/01/2011   Time of notification:  1836  Critical value read back:yes  Nurse who received alert:  Lucretia Field   MD notified (1st page):  Dr. Vassie Loll  Time of first page:  1840  MD notified (2nd page):  Time of second page:  Responding MD: Dr. Vassie Loll Time MD responded:  [phone]

## 2011-12-01 NOTE — Progress Notes (Signed)
ANTICOAGULATION CONSULT NOTE - Follow Up Consult  Pharmacy Consult for heparin, Coumadin Indication: atrial fibrillation  Labs:  Basename 12/01/11 0822 12/01/11 0400 11/30/11 2300 11/30/11 1742 11/30/11 0504 11/29/11 1306 11/28/11 1441  HGB -- 12.4* -- -- 11.0* -- --  HCT -- 40.0 -- -- 34.5* 34.8* --  PLT -- 93* -- -- 86* 95* --  APTT -- -- -- -- -- -- --  LABPROT -- -- -- -- -- -- 18.8*  INR -- -- -- -- -- -- 1.63*  HEPARINUNFRC 0.37 -- 0.25* -- 0.14* -- --  CREATININE -- 1.31 -- 0.92 0.98 -- --  CKTOTAL -- -- -- -- -- -- --  CKMB -- -- -- -- -- -- --  TROPONINI -- -- -- -- -- -- <0.30    Assessment: 78yo male therapeutic on heparin for afib.  Hg stable. Plan to restart coumadin today.  Home dose was 10 mg Mon and 7.5 mg other days.  Goal of Therapy:  Heparin level 0.3-0.5 units/ml INR 2-3   Plan:  Continue heparin at 1500 units/hr. Coumadin 10 mg today. Check daily heparin level, CBC and INR.  Talbert Cage, PharmD 12/01/2011,11:08 AM

## 2011-12-01 NOTE — Progress Notes (Signed)
Nursing  Lab reported to RN that patient's blood cultures are positive for gram + cocci in clusters, RN made Dr. Vassie Loll aware.  No new orders at this time.

## 2011-12-02 ENCOUNTER — Inpatient Hospital Stay (HOSPITAL_COMMUNITY): Payer: Medicare Other

## 2011-12-02 LAB — CBC
HCT: 38.5 % — ABNORMAL LOW (ref 39.0–52.0)
Hemoglobin: 12 g/dL — ABNORMAL LOW (ref 13.0–17.0)
MCH: 26.5 pg (ref 26.0–34.0)
MCHC: 31.2 g/dL (ref 30.0–36.0)
MCV: 85 fL (ref 78.0–100.0)
Platelets: 102 10*3/uL — ABNORMAL LOW (ref 150–400)
RBC: 4.53 MIL/uL (ref 4.22–5.81)
RDW: 15 % (ref 11.5–15.5)
WBC: 7.2 10*3/uL (ref 4.0–10.5)

## 2011-12-02 LAB — BLOOD GAS, ARTERIAL
Acid-base deficit: 2.7 mmol/L — ABNORMAL HIGH (ref 0.0–2.0)
Bicarbonate: 22.4 meq/L (ref 20.0–24.0)
FIO2: 0.4 %
MECHVT: 500 mL
O2 Saturation: 97.5 %
PEEP: 5 cmH2O
Patient temperature: 98.6
RATE: 14 {breaths}/min
TCO2: 23.7 mmol/L (ref 0–100)
pCO2 arterial: 44.3 mmHg (ref 35.0–45.0)
pH, Arterial: 7.324 — ABNORMAL LOW (ref 7.350–7.450)
pO2, Arterial: 107 mmHg — ABNORMAL HIGH (ref 80.0–100.0)

## 2011-12-02 LAB — BASIC METABOLIC PANEL
Calcium: 9.1 mg/dL (ref 8.4–10.5)
GFR calc Af Amer: 64 mL/min — ABNORMAL LOW (ref >90)
GFR calc non Af Amer: 55 mL/min — ABNORMAL LOW (ref >90)
Glucose, Bld: 178 mg/dL — ABNORMAL HIGH (ref 70–99)
Potassium: 4.5 mEq/L (ref 3.5–5.1)
Sodium: 142 mEq/L (ref 135–145)

## 2011-12-02 LAB — PROTIME-INR
INR: 1.2 (ref 0.00–1.49)
Prothrombin Time: 15 seconds (ref 11.6–15.2)
Prothrombin Time: 15 seconds (ref 11.6–15.2)

## 2011-12-02 LAB — PHOSPHORUS: Phosphorus: 2.9 mg/dL (ref 2.3–4.6)

## 2011-12-02 LAB — HEPARIN LEVEL (UNFRACTIONATED): Heparin Unfractionated: 0.3 [IU]/mL (ref 0.30–0.70)

## 2011-12-02 MED ORDER — POTASSIUM CHLORIDE 20 MEQ/15ML (10%) PO LIQD
40.0000 meq | Freq: Once | ORAL | Status: AC
  Administered 2011-12-02: 40 meq
  Filled 2011-12-02: qty 30

## 2011-12-02 MED ORDER — VANCOMYCIN HCL IN DEXTROSE 1-5 GM/200ML-% IV SOLN
1000.0000 mg | Freq: Two times a day (BID) | INTRAVENOUS | Status: DC
Start: 2011-12-02 — End: 2011-12-02
  Administered 2011-12-02: 1000 mg via INTRAVENOUS
  Filled 2011-12-02 (×2): qty 200

## 2011-12-02 MED ORDER — FUROSEMIDE 10 MG/ML IJ SOLN
40.0000 mg | Freq: Every day | INTRAMUSCULAR | Status: DC
  Administered 2011-12-02 – 2011-12-04 (×3): 40 mg via INTRAVENOUS
  Filled 2011-12-02 (×4): qty 4

## 2011-12-02 NOTE — Progress Notes (Signed)
Name: Terry Robertson MRN: [id number] DOB: 1928-09-20    LOS: 4  Referring Provider:  EDP -  Reason for Referral:    PULMONARY / CRITICAL CARE MEDICINE  HPI:  78 y/o M with PMH of CAD, HTN, CVA, ? DM (on no meds) who was noted on 9/23 to be driving erratically, went home and EMS was notified and found him sitting in a chair unable to speak but awake with right sided gaze, right arm twitching.  In ED upon arrival, was noted to have seizure. Further decompensated and required intubation per EDP.  Concern for new CVA.  PCCM consulted for ICU admit.     Subjective/interval Nursing and respiratory staff report significant endotracheal bloody secretions  Vital Signs: Temp:  [98.6 F (37 C)-99.5 F (37.5 C)] 99 F (37.2 C) (09/28 0700) Pulse Rate:  [59-80] 68  (09/28 0700) Resp:  [14-22] 16  (09/28 0700) BP: (104-159)/(52-100) 159/73 mmHg (09/28 0700) SpO2:  [93 %-99 %] 95 % (09/28 0700) FiO2 (%):  [40 %] 40 % (09/28 0750) Weight:  [105.8 kg (233 lb 4 oz)] 105.8 kg (233 lb 4 oz) (09/28 0500)  Physical Examination: General:  Elderly male in NAD Neuro:  Sedated on vent HEENT:  OETT, mm pink/moist, bloody tracheal secretions Cardiovascular:  s1s2 rrr, no m/r/g Lungs:  resp's even/non-labored on vent, breath sounds diminished bilaterally, occasional rhonchi Abdomen:  Round/soft, bsx4 active Musculoskeletal:  No acute deformities Skin:  Warm/dry, no edema  Active Problems:  Acute respiratory failure  Altered mental status  Seizure  Status epilepticus, generalized convulsive   ASSESSMENT AND PLAN  NEUROLOGIC  A:   Acute Seizure - felt by neurology to be due to multiple etiology including multi-lobe old infarcts, they did not think he had new CVA -EEG noted no seizure. -MRI noted no new strokes 19 old ones however. Altered Mental Status This had improved, was likely postictal state P:   -continue Keppra -Neurology Consult appreciated.   PULMONARY  Lab 12/02/11 0345 11/29/11  0402 11/28/11 1456  PHART 7.324* 7.427 7.350  PCO2ART 44.3 33.3* 36.6  PO2ART 107.0* 87.0 119.0*  HCO3 22.4 21.6 20.2  O2SAT 97.5 96.7 98.0   Ventilator Settings: Vent Mode:  [-] PSV FiO2 (%):  [40 %] 40 % Set Rate:  [14 bmp] 14 bmp Vt Set:  [500 mL] 500 mL PEEP:  [5 cmH20] 5 cmH20 Pressure Support:  [10 cmH20] 10 cmH20 Plateau Pressure:  [15 cmH20-21 cmH20] 21 cmH20 CXR:  9/28 Improving aeration on followup films, still has right basilar consolidation, and bilateral atelectasis  ETT:  9/23>>>9/26>>>9/26>>>  A:   Acute Respiratory Failure -initially in setting of neurological process but now due to upper airway obstruction, patient was a very difficult airway and multiple attempts at intubation in the ED that resulted in significant upper airway trauma.  Was extubated on 9/26 but had to be reintubated due to stridor.  Upon intubating the patient it was noted that there was significant edema in the upper airway resulting in upper airway compromise. Hemoptysis Likely due to upper airway trauma as well as anti-coagulation.   P:   -Maintain on PS but allow for a few days while steroids, diureses and healing hopefully address upper airway trauma. -diuresis as BUN and creatinine as well as blood pressure allow -Will need cuff leak prior to extubation given traumatic intubation. -we will hold anti-coagulation for the time being, hopefully when traumatic injuries improve we can resume anticoagulation. Will eval to resume heparin on 9/29 -low  threshold for empiric antibiotics if he spikes a fever, certainly at risk for aspiration injury  CARDIOVASCULAR  Lab 11/28/11 1441  TROPONINI <0.30  LATICACIDVEN --  PROBNP --   ECG:   Lines:  9/26 right IJ CVL>>> A:  Hx of Afib - diagnosed in 08/2011 as new onset.  He is currently in normal sinus rhythm Hx of HTN Hx of HLD  P:  -rate control, Metoprolol started and well tolerated. -Hold zocor. -KVO IVF. -Diureses as below. -We will hold  anticoagulation given bloody endotracheal output RENAL  Lab 12/02/11 0415 12/01/11 0400 11/30/11 1742 11/30/11 0504 11/29/11 0455  NA 142 142 140 141 140  K 4.5 4.6 -- -- --  CL 109 109 105 107 110  CO2 24 23 23 23 21   BUN 33* 15 8 7 9   CREATININE 1.20 1.31 0.92 0.98 0.84  CALCIUM 9.1 8.9 8.8 8.5 8.6  MG 2.5 2.5 2.1 2.2 2.1  PHOS 2.9 3.7 3.9 3.6 1.9*   Intake/Output      09/27 0701 - 09/28 0700 09/28 0701 - 09/29 0700   I.V. (mL/kg) 641.9 (6.1)    Other 460    NG/GT 540    IV Piggyback 728    Total Intake(mL/kg) 2369.9 (22.4)    Urine (mL/kg/hr) 2813 (1.1)    Total Output 2813    Net -443.1         Stool Occurrence 1 x      Intake/Output Summary (Last 24 hours) at 12/02/11 0804 Last data filed at 12/02/11 0700  Gross per 24 hour  Intake 2263.2 ml  Output   2738 ml  Net -474.8 ml   Foley:  9/23  A:  Hypokalemia. resolved  P:   -empiric potassium replacement with diuretics  GASTROINTESTINAL  Lab 11/28/11 1440  AST 19  ALT 9  ALKPHOS 44  BILITOT 0.4  PROT 8.3  ALBUMIN 3.1*   A:   nutrition  P:   -Continue TF. -PPI.  HEMATOLOGIC  Lab 12/02/11 0415 12/01/11 0400 11/30/11 0504 11/29/11 1306 11/29/11 0455 11/28/11 1441  HGB 12.0* 12.4* 11.0* 11.0* 11.0* --  HCT 38.5* 40.0 34.5* 34.8* 34.4* --  PLT 102* 93* 86* 95* 88* --  INR -- -- -- -- -- 1.63*  APTT -- -- -- -- -- --   A:   Airway bleeding - in setting of anticoagulation  P:  -Anticoagulation as discussed above.  INFECTIOUS  Lab 12/02/11 0415 12/01/11 0400 11/30/11 0504 11/29/11 1306 11/29/11 0455 11/28/11 1441  WBC 7.2 8.2 5.1 4.6 5.1 --  PROCALCITON -- -- -- -- -- <0.10   Cultures: 9/23 Bcx2>>>neg9/24 Sputum>>nonpathogenic oropharyngeal-type flora 9/26 Blood: coag neg staph 1/2 9/26 Urine>>>negative  Antibiotics: vanc 9/27>>>  A: Possible GPC bacteremia This appears to be one out of two samples, certainly could reflect contamination P:   -follow fever curve,  leukocytosis. -trend procalcitonin,  low -dc vancomycin   ENDOCRINE  Lab 12/02/11 0343 12/01/11 2351 12/01/11 2031 12/01/11 1534 12/01/11 1220  GLUCAP 184* 152* 161* 146* 147*   A:   Hx of DM - questionable diagnosis as pt is not on any home medications for DM    P:   -ICU SSI   Care during the described time interval was provided by me and/or other providers on the critical care team.  I have reviewed this patient's available data, including medical history, events of note, physical examination and test results as part of my evaluation  CC time x  32  m  Cyril Mourning MD. Tonny Bollman. [facility] Pager [phone] If no response call [phone]

## 2011-12-03 ENCOUNTER — Inpatient Hospital Stay (HOSPITAL_COMMUNITY): Payer: Medicare Other

## 2011-12-03 DIAGNOSIS — I519 Heart disease, unspecified: Secondary | ICD-10-CM

## 2011-12-03 LAB — BASIC METABOLIC PANEL
BUN: 35 mg/dL — ABNORMAL HIGH (ref 6–23)
Calcium: 9.3 mg/dL (ref 8.4–10.5)
Creatinine, Ser: 1 mg/dL (ref 0.50–1.35)
GFR calc non Af Amer: 68 mL/min — ABNORMAL LOW (ref >90)
Glucose, Bld: 169 mg/dL — ABNORMAL HIGH (ref 70–99)
Sodium: 148 mEq/L — ABNORMAL HIGH (ref 135–145)

## 2011-12-03 LAB — CBC
Hemoglobin: 11.9 g/dL — ABNORMAL LOW (ref 13.0–17.0)
MCH: 26 pg (ref 26.0–34.0)
MCHC: 30.4 g/dL (ref 30.0–36.0)

## 2011-12-03 LAB — GLUCOSE, CAPILLARY
Glucose-Capillary: 153 mg/dL — ABNORMAL HIGH (ref 70–99)
Glucose-Capillary: 158 mg/dL — ABNORMAL HIGH (ref 70–99)
Glucose-Capillary: 162 mg/dL — ABNORMAL HIGH (ref 70–99)
Glucose-Capillary: 175 mg/dL — ABNORMAL HIGH (ref 70–99)

## 2011-12-03 LAB — TRIGLYCERIDES: Triglycerides: 123 mg/dL (ref <150)

## 2011-12-03 LAB — PROTIME-INR: Prothrombin Time: 14.4 seconds (ref 11.6–15.2)

## 2011-12-03 MED ORDER — HEPARIN (PORCINE) IN NACL 100-0.45 UNIT/ML-% IJ SOLN
1500.0000 [IU]/h | INTRAMUSCULAR | Status: DC
  Administered 2011-12-03: 1550 [IU]/h via INTRAVENOUS
  Administered 2011-12-04: 1500 [IU]/h via INTRAVENOUS
  Filled 2011-12-03 (×3): qty 250

## 2011-12-03 MED ORDER — POTASSIUM CHLORIDE 20 MEQ/15ML (10%) PO LIQD
40.0000 meq | Freq: Once | ORAL | Status: AC
  Administered 2011-12-03: 40 meq
  Filled 2011-12-03: qty 30

## 2011-12-03 MED ORDER — INSULIN GLARGINE 100 UNIT/ML ~~LOC~~ SOLN
10.0000 [IU] | Freq: Every day | SUBCUTANEOUS | Status: DC
  Administered 2011-12-03: 10 [IU] via SUBCUTANEOUS

## 2011-12-03 NOTE — Progress Notes (Signed)
ANTICOAGULATION CONSULT NOTE - Follow Up Consult  Pharmacy Consult:  Heparin Indication:  Atrial fibrillation  No Known Allergies  Patient Measurements: Height: 5\' 5"  (165.1 cm) Weight: 229 lb 4.5 oz (104 kg) IBW/kg (Calculated) : 61.5  Heparin Dosing Weight: 85kg  Vital Signs: Temp: 99.7 F (37.6 C) (09/29 1300) BP: 174/73 mmHg (09/29 1300) Pulse Rate: 60  (09/29 1300)  Labs:  Basename 12/03/11 1720 12/03/11 0500 12/02/11 0855 12/02/11 0500 12/02/11 0415 12/01/11 1524 12/01/11 0400  HGB -- 11.9* -- -- 12.0* -- --  HCT -- 39.1 -- -- 38.5* -- 40.0  PLT -- 126* -- -- 102* -- 93*  APTT -- -- -- -- -- -- --  LABPROT -- 14.4 15.0 15.0 -- -- --  INR -- 1.14 1.20 1.20 -- -- --  HEPARINUNFRC 0.35 -- -- 0.30 -- 0.47 --  CREATININE -- 1.00 -- -- 1.20 -- 1.31  CKTOTAL -- -- -- -- -- -- --  CKMB -- -- -- -- -- -- --  TROPONINI -- -- -- -- -- -- --    Estimated Creatinine Clearance: 64.3 ml/min (by C-G formula based on Cr of 1).     Assessment: 52 YOM with atrial fibrillation to resume IV heparin as bloody tracheal secretions have resolved per documentation.  Heparin and Coumadin discontinued yesterday due to possible bleeding (likely from upper airway trauma).  Previously therapeutic on heparin 1500 units/hr, but the heparin levels were trending down.     Goal of Therapy:  Heparin level 0.3-0.5 units/ml Monitor platelets by anticoagulation protocol: Yes    Plan:  Continue Heparin at 1550 units/hr Recheck Heparin level in 6 hours to confirm.  Estella Husk, Pharm.D., BCPS Clinical Pharmacist  Phone [phone] Pager [phone] 12/03/2011, 5:47 PM

## 2011-12-03 NOTE — Progress Notes (Signed)
ANTICOAGULATION CONSULT NOTE - Follow Up Consult  Pharmacy Consult:  Heparin Indication:  Atrial fibrillation  No Known Allergies  Patient Measurements: Height: 5\' 5"  (165.1 cm) Weight: 229 lb 4.5 oz (104 kg) IBW/kg (Calculated) : 61.5  Heparin Dosing Weight: 85kg  Vital Signs: Temp: 99.1 F (37.3 C) (09/29 0700) BP: 174/72 mmHg (09/29 0700) Pulse Rate: 50  (09/29 0700)  Labs:  Basename 12/03/11 0500 12/02/11 0855 12/02/11 0500 12/02/11 0415 12/01/11 1524 12/01/11 0822 12/01/11 0400  HGB 11.9* -- -- 12.0* -- -- --  HCT 39.1 -- -- 38.5* -- -- 40.0  PLT 126* -- -- 102* -- -- 93*  APTT -- -- -- -- -- -- --  LABPROT 14.4 15.0 15.0 -- -- -- --  INR 1.14 1.20 1.20 -- -- -- --  HEPARINUNFRC -- -- 0.30 -- 0.47 0.37 --  CREATININE 1.00 -- -- 1.20 -- -- 1.31  CKTOTAL -- -- -- -- -- -- --  CKMB -- -- -- -- -- -- --  TROPONINI -- -- -- -- -- -- --    Estimated Creatinine Clearance: 64.3 ml/min (by C-G formula based on Cr of 1).     Assessment: 32 YOM with atrial fibrillation to resume IV heparin as bloody tracheal secretions have resolved per documentation.  Heparin and Coumadin discontinued yesterday due to possible bleeding (likely from upper airway trauma).  Previously therapeutic on heparin 1500 units/hr, but the heparin levels were trending down.     Goal of Therapy:  Heparin level 0.3-05 units/ml Monitor platelets by anticoagulation protocol: Yes    Plan:  - Resume heparin gtt at 1550 units/hr, no bolus - Check 8 hr HL - Daily HL / CBC - F/U Coumadin resumption      Leasa Kincannon D. Laney Potash, PharmD, BCPS Pager:  [phone] 12/03/2011, 8:09 AM

## 2011-12-03 NOTE — Progress Notes (Signed)
Name: Terry Robertson MRN: [id number] DOB: [birth year]    LOS: 5  Referring Provider:  EDP -  Reason for Referral:    PULMONARY / CRITICAL CARE MEDICINE  HPI:  78 y/o M with PMH of CAD, HTN, CVA, ? DM (on no meds) who was noted on 9/23 to be driving erratically, went home and EMS was notified and found him sitting in a chair unable to speak but awake with right sided gaze, right arm twitching.  In ED upon arrival, was noted to have seizure. Further decompensated and required intubation per EDP.  Concern for new CVA.  PCCM consulted for ICU admit.     Subjective/interval Bloody secretions have subsided  Vital Signs: Temp:  [98.8 F (37.1 C)-99.9 F (37.7 C)] 99.1 F (37.3 C) (09/29 0700) Pulse Rate:  [46-88] 50  (09/29 0700) Resp:  [12-20] 14  (09/29 0700) BP: (126-181)/(49-90) 174/72 mmHg (09/29 0700) SpO2:  [94 %-99 %] 97 % (09/29 0700) FiO2 (%):  [40 %] 40 % (09/29 0422) Weight:  [104 kg (229 lb 4.5 oz)] 104 kg (229 lb 4.5 oz) (09/29 0600)  Physical Examination: General:  Elderly male in NAD Neuro:  Sedated on vent HEENT:  OETT, mm pink/moist, tracheal secretions are clear Cardiovascular:  s1s2 rrr, no m/r/g Lungs:  Scatter rhonchi, bedside cuff test negative for air leak. Abdomen:  Round/soft, bsx4 active Musculoskeletal:  No acute deformities Skin:  Warm/dry, no edema  Active Problems:  Acute respiratory failure  Altered mental status  Seizure  Status epilepticus, generalized convulsive   ASSESSMENT AND PLAN  NEUROLOGIC  A:   Acute Seizure - felt by neurology to be due to multiple etiology including multi-lobe old infarcts, they did not think he had new CVA -EEG noted no seizure. -MRI noted no new strokes 44 old ones however. Altered Mental Status This had improved, was likely postictal state P:   -continue Keppra -Neurology Consult appreciated.   PULMONARY  Lab 12/02/11 0345 11/29/11 0402 11/28/11 1456  PHART 7.324* 7.427 7.350  PCO2ART 44.3 33.3* 36.6    PO2ART 107.0* 87.0 119.0*  HCO3 22.4 21.6 20.2  O2SAT 97.5 96.7 98.0   Ventilator Settings: Vent Mode:  [-] PRVC FiO2 (%):  [40 %] 40 % Set Rate:  [14 bmp] 14 bmp Vt Set:  [500 mL] 500 mL PEEP:  [5 cmH20] 5 cmH20 Pressure Support:  [10 cmH20] 10 cmH20 Plateau Pressure:  [16 cmH20] 16 cmH20 CXR:  9/29 right basilar atelectasis, otherwise improving film/aeration  ETT:  9/23>>>9/26>>>9/26>>>  A:   Acute Respiratory Failure -initially in setting of neurological process but now due to upper airway obstruction, patient was a very difficult airway and multiple attempts at intubation in the ED that resulted in significant upper airway trauma.  Was extubated on 9/26 but had to be reintubated due to stridor.  Upon intubating the patient it was noted that there was significant edema in the upper airway resulting in upper airway compromise. Negative leak test on endotracheal cuff 9/29 Hemoptysis Likely due to upper airway trauma as well as anti-coagulation. This has resolved  P:   -Maintain on PS but allow for a few days while steroids, diureses and healing hopefully address upper airway trauma. -diuresis as BUN and creatinine as well as blood pressure allow -Will need cuff leak prior to extubation given traumatic intubation, failed on 9/29 -Will watch tracheal secretions closely after resuming heparin  CARDIOVASCULAR  Lab 11/28/11 1441  TROPONINI <0.30  LATICACIDVEN --  PROBNP --  ECG:   Lines:  9/26 right IJ CVL>>> A:  Hx of Afib - diagnosed in 08/2011 as new onset.  He is currently in normal sinus rhythm Hx of HTN Hx of HLD  P:  -rate control, Metoprolol started and well tolerated. -Hold zocor. -KVO IVF. -Diureses as below. -Will resume heparin without bolus, now that bloody tracheal secretions have resolved  RENAL  Lab 12/03/11 0500 12/02/11 0415 12/01/11 0400 11/30/11 1742 11/30/11 0504 11/29/11 0455  NA 148* 142 142 140 141 --  K 4.4 4.5 -- -- -- --  CL 114* 109 109  105 107 --  CO2 27 24 23 23 23  --  BUN 35* 33* 15 8 7  --  CREATININE 1.00 1.20 1.31 0.92 0.98 --  CALCIUM 9.3 9.1 8.9 8.8 8.5 --  MG -- 2.5 2.5 2.1 2.2 2.1  PHOS -- 2.9 3.7 3.9 3.6 1.9*   Intake/Output      09/28 0701 - 09/29 0700 09/29 0701 - 09/30 0700   I.V. (mL/kg) 420 (4)    Other     NG/GT 600    IV Piggyback 224    Total Intake(mL/kg) 1244 (12)    Urine (mL/kg/hr) 2575 (1)    Total Output 2575    Net -1331           Intake/Output Summary (Last 24 hours) at 12/03/11 0751 Last data filed at 12/03/11 0700  Gross per 24 hour  Intake   1244 ml  Output   2575 ml  Net  -1331 ml   Foley:  9/23  A:   hypernatremia  P:   -empiric potassium replacement with diuretics -add free water  GASTROINTESTINAL  Lab 11/28/11 1440  AST 19  ALT 9  ALKPHOS 44  BILITOT 0.4  PROT 8.3  ALBUMIN 3.1*   A:   nutrition  P:   -Continue TF. -PPI.  HEMATOLOGIC  Lab 12/03/11 0500 12/02/11 0855 12/02/11 0500 12/02/11 0415 12/01/11 0400 11/30/11 0504 11/29/11 1306 11/28/11 1441  HGB 11.9* -- -- 12.0* 12.4* 11.0* 11.0* --  HCT 39.1 -- -- 38.5* 40.0 34.5* 34.8* --  PLT 126* -- -- 102* 93* 86* 95* --  INR 1.14 1.20 1.20 -- -- -- -- 1.63*  APTT -- -- -- -- -- -- -- --   A:   Airway bleeding - in setting of anticoagulation  P:  -Anticoagulation as discussed above.  INFECTIOUS  Lab 12/03/11 0500 12/02/11 0415 12/01/11 0400 11/30/11 0504 11/29/11 1306 11/28/11 1441  WBC 5.3 7.2 8.2 5.1 4.6 --  PROCALCITON 0.15 -- -- -- -- <0.10   Cultures: 9/23 Bcx2>>>neg9/24 Sputum>>nonpathogenic oropharyngeal-type flora 9/26 Blood: coag neg staph 1/2 9/26 Urine>>>negative  Antibiotics: vanc 9/27>>>9/28  A: coag negative staph blood culture This was coag negative staph, one out of 2 samples, doubt that this was a legitimate sample in likely reflects contamination P:   -follow fever curve, leukocytosis. -trend procalcitonin,  Off antibiotics   ENDOCRINE  Lab 12/03/11 0007  12/02/11 1928 12/02/11 1523 12/02/11 1219 12/02/11 0804  GLUCAP 169* 142* 160* 160* 174*   A:   Hx of DM - questionable diagnosis as pt is not on any home medications for DM    P:   -ICU SSI -Lantus  Care during the described time interval was provided by me and/or other providers on the critical care team.  I have reviewed this patient's available data, including medical history, events of note, physical examination and test results as part of my evaluation  CC time x 32 m  Cyril Mourning MD. Tonny Bollman. [facility] Pager [phone] If no response call [phone]

## 2011-12-04 ENCOUNTER — Inpatient Hospital Stay (HOSPITAL_COMMUNITY): Payer: Medicare Other

## 2011-12-04 LAB — CBC
HCT: 39.5 % (ref 39.0–52.0)
Hemoglobin: 12.2 g/dL — ABNORMAL LOW (ref 13.0–17.0)
RBC: 4.6 MIL/uL (ref 4.22–5.81)
RDW: 15.1 % (ref 11.5–15.5)
WBC: 4.6 10*3/uL (ref 4.0–10.5)

## 2011-12-04 LAB — GLUCOSE, CAPILLARY
Glucose-Capillary: 155 mg/dL — ABNORMAL HIGH (ref 70–99)
Glucose-Capillary: 99 mg/dL (ref 70–99)
Glucose-Capillary: 95 mg/dL (ref 70–99)

## 2011-12-04 LAB — BASIC METABOLIC PANEL
BUN: 39 mg/dL — ABNORMAL HIGH (ref 6–23)
CO2: 27 mEq/L (ref 19–32)
Chloride: 111 mEq/L (ref 96–112)
GFR calc Af Amer: 87 mL/min — ABNORMAL LOW (ref >90)
Potassium: 3.9 mEq/L (ref 3.5–5.1)

## 2011-12-04 LAB — CULTURE, BLOOD (ROUTINE X 2): Culture: NO GROWTH

## 2011-12-04 NOTE — Procedures (Signed)
Extubation Procedure Note  Patient Details:   Name: Terry Robertson DOB: October 12, 1928 MRN: [id number]   Airway Documentation:  AIRWAYS (Active)  Secured at (cm) 25 cm 11/28/2011  3:10 PM  Measured From Lips 11/28/2011  3:10 PM  Secured Location Right 11/28/2011  3:10 PM  Secured By [employer] 11/28/2011  3:10 PM     Airway 7 mm (Active)  Secured at (cm) 24 cm 12/04/2011  7:35 AM  Measured From Lips 12/04/2011  7:35 AM  Secured Location Right 12/03/2011  2:44 PM  Secured By [employer] 12/04/2011  7:35 AM  Tube Holder Repositioned Yes 12/04/2011  7:35 AM  Cuff Pressure (cm H2O) 24 cm H2O 12/02/2011 11:58 PM  Site Condition Cool;Dry 12/01/2011  8:04 AM    Evaluation  O2 sats: 98 Complications: None Patienttolerate procedure well. Bilateral Breath Sounds: Clear;Diminished Suctioning: Airway Patient able to speak at a whisper, no strider noted, HR 50, RR 18 on NIV at CPAP5, PSV5, 40% O2. No cuff leak detected prior to extubation.  MD aware and at bedside.  [name] 12/04/2011, 9:39 AM

## 2011-12-04 NOTE — Progress Notes (Signed)
Name: Terry Robertson MRN: [id number] DOB: 1928-05-06    LOS: 6  Referring Provider:  EDP -  Reason for Referral:    PULMONARY / CRITICAL CARE MEDICINE  HPI:  78 y/o M with PMH of CAD, HTN, CVA, ? DM (on no meds) who was noted on 9/23 to be driving erratically, went home and EMS was notified and found him sitting in a chair unable to speak but awake with right sided gaze, right arm twitching.  In ED upon arrival, was noted to have seizure. Further decompensated and required intubation per EDP.  Concern for new CVA.  PCCM consulted for ICU admit.     Subjective/interval Bloody secretions have subsided Awake, interactive, denies CP, dyspnea   Vital Signs: Temp:  [99 F (37.2 C)-99.7 F (37.6 C)] 99.3 F (37.4 C) (09/30 0800) Pulse Rate:  [41-71] 60  (09/30 0800) Resp:  [14-20] 17  (09/30 0800) BP: (129-190)/(53-100) 178/73 mmHg (09/30 0800) SpO2:  [94 %-100 %] 100 % (09/30 0800) FiO2 (%):  [30 %-40 %] 40 % (09/30 0831) Weight:  [103.3 kg (227 lb 11.8 oz)] 103.3 kg (227 lb 11.8 oz) (09/30 0500)  Physical Examination: General:  Elderly male in NAD Neuro: awake, interactive, non focal HEENT:  OETT, mm pink/moist, tracheal secretions are clear Cardiovascular:  s1s2 rrr, no m/r/g Lungs:  Clear, no rhonchi Abdomen:  Round/soft, bsx4 active Musculoskeletal:  No acute deformities Skin:  Warm/dry, no edema  Active Problems:  Acute respiratory failure  Altered mental status  Seizure  Status epilepticus, generalized convulsive   ASSESSMENT AND PLAN  NEUROLOGIC  A:   Acute Seizure - felt by neurology to be due to multiple etiology including multi-lobe old infarcts, they did not think he had new CVA -EEG noted no seizure. -MRI noted no new strokes 78 old ones however.  P:   -continue Keppra -Neurology Consult appreciated.   PULMONARY  Lab 12/02/11 0345 11/29/11 0402 11/28/11 1456  PHART 7.324* 7.427 7.350  PCO2ART 44.3 33.3* 36.6  PO2ART 107.0* 87.0 119.0*  HCO3 22.4 21.6  20.2  O2SAT 97.5 96.7 98.0   Ventilator Settings: Vent Mode:  [-] PSV;CPAP FiO2 (%):  [30 %-40 %] 40 % Set Rate:  [14 bmp] 14 bmp Vt Set:  [500 mL] 500 mL PEEP:  [5 cmH20] 5 cmH20 Pressure Support:  [5 cmH20-10 cmH20] 5 cmH20 Plateau Pressure:  [16 cmH20-18 cmH20] 18 cmH20 CXR:  9/29 right basilar atelectasis, otherwise improving film/aeration  ETT:  9/23>>>9/26>>>9/26>>>  A:   Acute Respiratory Failure -initially in setting of neurological process but now due to upper airway obstruction, patient was a very difficult airway and multiple attempts at intubation in the ED that resulted in significant upper airway trauma.  Was extubated on 9/26 but had to be reintubated due to stridor.  Upon intubating the patient it was noted that there was significant edema in the upper airway resulting in upper airway compromise. Negative leak test on endotracheal cuff 9/29 Hemoptysis Likely due to upper airway trauma as well as anti-coagulation. This has resolved  P:   -tolerating PS 5/5, proceed with extubation if cuff leak + -Will need cuff leak prior to extubation given traumatic intubation, failed on 9/29 -No bleeding after resuming heparin  CARDIOVASCULAR  Lab 11/28/11 1441  TROPONINI <0.30  LATICACIDVEN --  PROBNP --   ECG:   Lines:  9/26 right IJ CVL>>> A:  Hx of Afib - diagnosed in 08/2011 as new onset.  He is currently in normal sinus rhythm  Hx of HTN Hx of HLD  P:  -rate control, Metoprolol started and well tolerated. -Hold zocor. - ct heparin   RENAL  Lab 12/04/11 0315 12/03/11 0500 12/02/11 0415 12/01/11 0400 11/30/11 1742 11/30/11 0504 11/29/11 0455  NA 147* 148* 142 142 140 -- --  K 3.9 4.4 -- -- -- -- --  CL 111 114* 109 109 105 -- --  CO2 27 27 24 23 23  -- --  BUN 39* 35* 33* 15 8 -- --  CREATININE 0.97 1.00 1.20 1.31 0.92 -- --  CALCIUM 9.4 9.3 9.1 8.9 8.8 -- --  MG -- -- 2.5 2.5 2.1 2.2 2.1  PHOS -- -- 2.9 3.7 3.9 3.6 1.9*   Intake/Output      09/29 0701 -  09/30 0700 09/30 0701 - 10/01 0700   I.V. (mL/kg) 820.6 (7.9) 35 (0.3)   NG/GT 40 40   IV Piggyback     Total Intake(mL/kg) 860.6 (8.3) 75 (0.7)   Urine (mL/kg/hr) 2200 (0.9) 150   Total Output 2200 150   Net -1339.4 -75          Intake/Output Summary (Last 24 hours) at 12/04/11 0859 Last data filed at 12/04/11 0800  Gross per 24 hour  Intake  875.6 ml  Output   2200 ml  Net -1324.4 ml   Foley:  9/23  A:   hypernatremia  P:   -ct lasix 40 daily -ct free water  GASTROINTESTINAL  Lab 11/28/11 1440  AST 19  ALT 9  ALKPHOS 44  BILITOT 0.4  PROT 8.3  ALBUMIN 3.1*   A:   nutrition  P:   -Continue TF. -PPI.  HEMATOLOGIC  Lab 12/04/11 0753 12/04/11 0315 12/03/11 0500 12/02/11 0855 12/02/11 0500 12/02/11 0415 12/01/11 0400 11/30/11 0504 11/28/11 1441  HGB -- 12.2* 11.9* -- -- 12.0* 12.4* 11.0* --  HCT -- 39.5 39.1 -- -- 38.5* 40.0 34.5* --  PLT -- 126* 126* -- -- 102* 93* 86* --  INR 1.18 -- 1.14 1.20 1.20 -- -- -- 1.63*  APTT -- -- -- -- -- -- -- -- --   A:   Airway bleeding - in setting of anticoagulation  P:  -Anticoagulation as discussed above.  INFECTIOUS  Lab 12/04/11 0315 12/03/11 0500 12/02/11 0415 12/01/11 0400 11/30/11 0504 11/28/11 1441  WBC 4.6 5.3 7.2 8.2 5.1 --  PROCALCITON <0.10 0.15 -- -- -- <0.10   Cultures: 9/23 Bcx2>>>neg9/24 Sputum>>nonpathogenic oropharyngeal-type flora 9/26 Blood: coag neg staph 1/2 9/26 Urine>>>negative  Antibiotics: vanc 9/27>>>9/28  A: coag negative staph blood culture This was coag negative staph, one out of 2 samples, doubt that this was a legitimate sample in likely reflects contamination, low pct reassuring P:   -follow fever curve, leukocytosis. -Off antibiotics   ENDOCRINE  Lab 12/03/11 2349 12/03/11 1944 12/03/11 1650 12/03/11 1241 12/03/11 0751  GLUCAP 158* 175* 173* 162* 163*   A:   Hx of DM - questionable diagnosis as pt is not on any home medications for DM ?steroid induced   P:   -ICU  SSI -Lantus  Care during the described time interval was provided by me and/or other providers on the critical care team.  I have reviewed this patient's available data, including medical history, events of note, physical examination and test results as part of my evaluation  CC time x 32 m  Cyril Mourning MD. [facility]. [place] [facility] Pager [phone] If no response call [phone]

## 2011-12-04 NOTE — Progress Notes (Signed)
Pt's daughter requested that I go by and see pt since I was in the ED when he arrived and was very sick. Daughter said "he is a miracle." When I arrived to pt room, his friend (Mr. Loleta Chance) was bedside. Pt was talking and smiling. I introduced myself and told him he had a nice and concerned family. After visiting a few moments, I asked pt if there was anything I could do for him. He said, yes, you can pray.  Had prayer w/pt. He was very glad for the visit.  Marjory Lies Chaplain

## 2011-12-04 NOTE — Progress Notes (Signed)
ANTICOAGULATION CONSULT NOTE - Follow Up Consult  Pharmacy Consult for heparin Indication: atrial fibrillation  Labs:  Basename 12/03/11 2330 12/03/11 1720 12/03/11 0500 12/02/11 0855 12/02/11 0500 12/02/11 0415 12/01/11 0400  HGB -- -- 11.9* -- -- 12.0* --  HCT -- -- 39.1 -- -- 38.5* 40.0  PLT -- -- 126* -- -- 102* 93*  APTT -- -- -- -- -- -- --  LABPROT -- -- 14.4 15.0 15.0 -- --  INR -- -- 1.14 1.20 1.20 -- --  HEPARINUNFRC 0.58 0.35 -- -- 0.30 -- --  CREATININE -- -- 1.00 -- -- 1.20 1.31  CKTOTAL -- -- -- -- -- -- --  CKMB -- -- -- -- -- -- --  TROPONINI -- -- -- -- -- -- --    Assessment: 78yo male now slightly supratherapeutic on heparin after resumed for Afib with low goal.  Per RN no further bleeding complications.  Goal of Therapy:  Heparin level 0.3-0.5 units/ml   Plan:  Will decrease heparin gtt to 1500 units/hr and check level in 8hr.  Colleen Can PharmD BCPS 12/04/2011,12:12 AM

## 2011-12-05 ENCOUNTER — Inpatient Hospital Stay (HOSPITAL_COMMUNITY): Payer: Medicare Other

## 2011-12-05 LAB — BASIC METABOLIC PANEL
Calcium: 9.7 mg/dL (ref 8.4–10.5)
GFR calc Af Amer: 77 mL/min — ABNORMAL LOW (ref >90)
GFR calc non Af Amer: 66 mL/min — ABNORMAL LOW (ref >90)
Potassium: 4.1 mEq/L (ref 3.5–5.1)
Sodium: 153 mEq/L — ABNORMAL HIGH (ref 135–145)

## 2011-12-05 LAB — GLUCOSE, CAPILLARY
Glucose-Capillary: 113 mg/dL — ABNORMAL HIGH (ref 70–99)
Glucose-Capillary: 128 mg/dL — ABNORMAL HIGH (ref 70–99)

## 2011-12-05 MED ORDER — WARFARIN SODIUM 10 MG PO TABS
10.0000 mg | ORAL_TABLET | Freq: Once | ORAL | Status: AC
  Administered 2011-12-05: 10 mg via ORAL
  Filled 2011-12-05: qty 1

## 2011-12-05 MED ORDER — WARFARIN - PHARMACIST DOSING INPATIENT: Freq: Every day | Status: DC

## 2011-12-05 NOTE — Progress Notes (Signed)
Rehab Admissions Coordinator Note:  Patient was screened by Brock Ra for appropriateness for an Inpatient Acute Rehab Consult.  At this time, we are recommending Inpatient Rehab consult.  PT recommends CIR.  Melanee Spry S 12/05/2011, 4:03 PM  I can be reached at [phone].

## 2011-12-05 NOTE — Progress Notes (Addendum)
ANTICOAGULATION CONSULT NOTE - Initial Consult  Pharmacy Consult for Coumadin Indication: atrial fibrillation  No Known Allergies  Patient Measurements: Height: 5\' 5"  (165.1 cm) Weight: 224 lb 13.9 oz (102 kg) IBW/kg (Calculated) : 61.5    Vital Signs: Temp: 97.9 F (36.6 C) (10/01 1100) Temp src: Core (Comment) (10/01 0700) BP: 147/79 mmHg (10/01 1100) Pulse Rate: 72  (10/01 1100)  Labs:  Terry Robertson 12/05/11 0440 12/04/11 0753 12/04/11 0315 12/03/11 2330 12/03/11 1720 12/03/11 0500  HGB -- -- 12.2* -- -- 11.9*  HCT -- -- 39.5 -- -- 39.1  PLT -- -- 126* -- -- 126*  APTT -- -- -- -- -- --  LABPROT -- 14.8 -- -- -- 14.4  INR -- 1.18 -- -- -- 1.14  HEPARINUNFRC -- 0.74* -- 0.58 0.35 --  CREATININE 1.03 -- 0.97 -- -- 1.00  CKTOTAL -- -- -- -- -- --  CKMB -- -- -- -- -- --  TROPONINI -- -- -- -- -- --    Estimated Creatinine Clearance: 61.8 ml/min (by C-G formula based on Cr of 1.03).   Medical History: Past Medical History  Diagnosis Date  . Coronary artery disease   . Diabetes mellitus   . Hypertension   . Stroke     Medications:  Scheduled:    . antiseptic oral rinse  15 mL Mouth Rinse QID  . chlorhexidine  15 mL Mouth Rinse BID  . insulin aspart  2-6 Units Subcutaneous Q4H  . levetiracetam  500 mg Intravenous Q12H  . DISCONTD: furosemide  40 mg Intravenous Daily  . DISCONTD: insulin glargine  10 Units Subcutaneous QHS  . DISCONTD: metoprolol tartrate  12.5 mg Per Tube BID  . DISCONTD: pantoprazole (PROTONIX) IV  40 mg Intravenous QHS    Assessment: 78 yr old male to restart coumadin for afib.  Patient was transferred from 3100 after being on heparin, admitted on 9/23 with a seizure and concern for new CVA.  Patients PTA dose of coumadin is 10mg  on Mondays and 11.25mg  on all other days.    Goal of Therapy:  INR 2-3 Monitor platelets by anticoagulation protocol: Yes   Plan:  Coumadin 10 mg x 1 dose tonight. Daily PT/INR.  Terry Robertson, PharmD,  BCPS Clinical Pharmacist  Pager: [phone]   Terry Robertson 12/05/2011,1:43 PM

## 2011-12-05 NOTE — Progress Notes (Signed)
Name: Terry Robertson MRN: [id number] DOB: [birth year]    LOS: 7  Referring Provider:  EDP -  Reason for Referral:    PULMONARY / CRITICAL CARE MEDICINE  HPI:  78 y/o M with PMH of CAD, HTN, CVA, ? DM (on no meds) who was noted on 9/23 to be driving erratically, went home and EMS was notified and found him sitting in a chair unable to speak but awake with right sided gaze, right arm twitching.  In ED upon arrival, was noted to have seizure. Further decompensated and required intubation per EDP.  Concern for new CVA.  PCCM consulted for ICU admit.     Subjective/interval Did well post extubation Awake, interactive, denies CP, dyspnea   Vital Signs: Temp:  [92.5 F (33.6 C)-99.5 F (37.5 C)] 99.1 F (37.3 C) (10/01 0815) Pulse Rate:  [42-69] 52  (10/01 0815) Resp:  [6-19] 17  (10/01 0815) BP: (149-183)/(52-81) 166/63 mmHg (10/01 0815) SpO2:  [94 %-99 %] 96 % (10/01 0815) FiO2 (%):  [40 %] 40 % (09/30 1200) Weight:  [102 kg (224 lb 13.9 oz)] 102 kg (224 lb 13.9 oz) (10/01 0500)  Physical Examination: General:  Elderly male in NAD Neuro: awake, interactive, non focal HEENT:  mm pink/moist, good cough Cardiovascular:  s1s2 rrr, no m/r/g Lungs:  Clear, no rhonchi Abdomen:  Round/soft, bsx4 active Musculoskeletal:  No acute deformities Skin:  Warm/dry, no edema  Active Problems:  Acute respiratory failure  Altered mental status  Seizure  Status epilepticus, generalized convulsive   ASSESSMENT AND PLAN  NEUROLOGIC  A:   Acute Seizure - felt by neurology to be due to multiple etiology including multi-lobe old infarcts, they did not think he had new CVA -EEG noted no seizure. -MRI noted no new strokes 64 old ones however.  P:   -continue Keppra -change to PO when able -Neurology Consult appreciated.   PULMONARY  Lab 12/02/11 0345 11/29/11 0402 11/28/11 1456  PHART 7.324* 7.427 7.350  PCO2ART 44.3 33.3* 36.6  PO2ART 107.0* 87.0 119.0*  HCO3 22.4 21.6 20.2  O2SAT 97.5  96.7 98.0   Ventilator Settings: Vent Mode:  [-] PSV;CPAP FiO2 (%):  [40 %] 40 % PEEP:  [5 cmH20] 5 cmH20 Pressure Support:  [5 cmH20] 5 cmH20 CXR:  9/29 right basilar atelectasis, otherwise improving film/aeration  ETT:  9/23>>>9/26>>>9/26>>>9/30  A:   Acute Respiratory Failure -initially in setting of neurological process but now due to upper airway obstruction, patient was a very difficult airway and multiple attempts at intubation in the ED that resulted in significant upper airway trauma.  Was extubated on 9/26 but had to be reintubated due to stridor.  Upon intubating the patient it was noted that there was significant edema in the upper airway resulting in upper airway compromise. Negative leak test on endotracheal cuff 9/29 Hemoptysis Likely due to upper airway trauma as well as anti-coagulation. This has resolved  P:   Swallow eval, pT  CARDIOVASCULAR  Lab 11/28/11 1441  TROPONINI <0.30  LATICACIDVEN --  PROBNP --   ECG:   Lines:  9/26 right IJ CVL>>> A:  Hx of Afib - diagnosed in 08/2011 as new onset.  He is currently in normal sinus rhythm Hx of HTN Hx of HLD  P:  -rate control, Metoprolol started and well tolerated. -Hold zocor. - resume coumadin  RENAL  Lab 12/05/11 0440 12/04/11 0315 12/03/11 0500 12/02/11 0415 12/01/11 0400 11/30/11 1742 11/30/11 0504 11/29/11 0455  NA 153* 147* 148* 142 142 -- -- --  K 4.1 3.9 -- -- -- -- -- --  CL 115* 111 114* 109 109 -- -- --  CO2 31 27 27 24 23  -- -- --  BUN 33* 39* 35* 33* 15 -- -- --  CREATININE 1.03 0.97 1.00 1.20 1.31 -- -- --  CALCIUM 9.7 9.4 9.3 9.1 8.9 -- -- --  MG -- -- -- 2.5 2.5 2.1 2.2 2.1  PHOS -- -- -- 2.9 3.7 3.9 3.6 1.9*   Intake/Output      09/30 0701 - 10/01 0700 10/01 0701 - 10/02 0700   I.V. (mL/kg) 495 (4.9) 20 (0.2)   NG/GT 40    IV Piggyback 420 105   Total Intake(mL/kg) 955 (9.4) 125 (1.2)   Urine (mL/kg/hr) 2460 (1) 30   Total Output 2460 30   Net -1505 +95           Intake/Output Summary (Last 24 hours) at 12/05/11 0849 Last data filed at 12/05/11 0809  Gross per 24 hour  Intake   1005 ml  Output   2340 ml  Net  -1335 ml   Foley:  9/23  A:   hypernatremia  P:   -hold lasix -ct free water  GASTROINTESTINAL  Lab 11/28/11 1440  AST 19  ALT 9  ALKPHOS 44  BILITOT 0.4  PROT 8.3  ALBUMIN 3.1*   A:   nutrition  P:   -swallow & advance PO -dc PPI.  HEMATOLOGIC  Lab 12/04/11 0753 12/04/11 0315 12/03/11 0500 12/02/11 0855 12/02/11 0500 12/02/11 0415 12/01/11 0400 11/30/11 0504 11/28/11 1441  HGB -- 12.2* 11.9* -- -- 12.0* 12.4* 11.0* --  HCT -- 39.5 39.1 -- -- 38.5* 40.0 34.5* --  PLT -- 126* 126* -- -- 102* 93* 86* --  INR 1.18 -- 1.14 1.20 1.20 -- -- -- 1.63*  APTT -- -- -- -- -- -- -- -- --   A:   Airway bleeding - in setting of anticoagulation  P:  -Anticoagulation as discussed above.  INFECTIOUS  Lab 12/04/11 0315 12/03/11 0500 12/02/11 0415 12/01/11 0400 11/30/11 0504 11/28/11 1441  WBC 4.6 5.3 7.2 8.2 5.1 --  PROCALCITON <0.10 0.15 -- -- -- <0.10   Cultures: 9/23 Bcx2>>>neg9/24 Sputum>>nonpathogenic oropharyngeal-type flora 9/26 Blood: coag neg staph 1/2 9/26 Urine>>>negative  Antibiotics: vanc 9/27>>>9/28  A: coag negative staph blood culture This was coag negative staph, one out of 2 samples, doubt that this was a legitimate sample in likely reflects contamination, low pct reassuring P:   -Off antibiotics   ENDOCRINE  Lab 12/05/11 0801 12/05/11 0440 12/05/11 0031 12/04/11 1945 12/04/11 1613  GLUCAP 113* 101* 100* 98 95   A:   Hx of DM - questionable diagnosis as pt is not on any home medications for DM ?steroid induced   P:   -ICU SSI -dc Lantus (now that off steroids)   Cyril Mourning MD. FCCP. [place] [facility] Pager [phone] If no response call [phone]

## 2011-12-05 NOTE — Evaluation (Addendum)
Physical Therapy Evaluation Patient Details Name: Terry Robertson MRN: [id number] DOB: 21-Oct-1928 Today's Date: 12/05/2011 Time: 2130-8657 PT Time Calculation (min): 39 min  PT Assessment / Plan / Recommendation Clinical Impression  78 y.o. male admitted to [facility] for seizure VDRF.  Was extubated 12/04/11.  He has significant PMH for multiple strokes and presents today with generalized weakness, difficulty walking (having to use a RW instead of his usual cane), decreased activity tolerance and decreased balance.  He would benefit from an inpatient rehab consult.  I spoke at length with the pt and the family QI:[id number] options (HH vs SNF for rehab vs CIR) and they would like to persue inpatient rehab as an option.      PT Assessment  Patient needs continued PT services    Follow Up Recommendations  Post acute inpatient rehab    Barriers to Discharge  none- good family and friend support system, son used to work as Buyer, retail.        Equipment Recommendations  None recommended by PT    Recommendations for Other Services Rehab consult   Frequency Min 3X/week    Precautions / Restrictions Precautions Precautions: Fall   Pertinent Vitals/Pain O2 sats on RA 98% after walking with PT      Mobility  Bed Mobility Bed Mobility: Supine to Sit;Sitting - Scoot to Edge of Bed Supine to Sit: 3: Mod assist;HOB elevated;With rails Sitting - Scoot to Edge of Bed: 4: Min assist;With rail Details for Bed Mobility Assistance: mod assist to boost trunk up off of elevated bed.  This is the patient's first attempt at EOB and OOB.   Transfers Transfers: Sit to Stand;Stand to Sit Sit to Stand: 3: Mod assist;With upper extremity assist;From bed Stand to Sit: 3: Mod assist;With upper extremity assist;To chair/3-in-1 Details for Transfer Assistance: mod assist to support trunk for balance secondary to left posterior LOB upon initally standing.  Plopped back down onto the bed.    Ambulation/Gait Ambulation/Gait Assistance: 3: Mod assist Ambulation Distance (Feet): 20 Feet Assistive device: Rolling walker Ambulation/Gait Assistance Details: mod assist to weight shift to the left, keep pt's trunk balanced and in midline and to help steer Rw.  Pt with flexed posture family reports this was premorbid.   Gait Pattern: Step-through pattern;Shuffle;Trunk flexed;Lateral trunk lean to right Gait velocity: less than 1.8 ft/sec which puts him at risk for recurrent falls.         Exercises General Exercises - Lower Extremity Long Arc Quad: AROM;10 reps;Both;Seated Toe Raises: AROM;Both;20 reps;Seated Heel Raises: AROM;Both;20 reps;Seated   PT Diagnosis: Difficulty walking;Abnormality of gait;Generalized weakness  PT Problem List: Decreased strength;Decreased activity tolerance;Decreased balance;Decreased mobility;Decreased knowledge of use of DME PT Treatment Interventions: DME instruction;Gait training;Stair training;Functional mobility training;Therapeutic exercise;Therapeutic activities;Balance training;Neuromuscular re-education;Patient/family education   PT Goals Acute Rehab PT Goals PT Goal Formulation: With patient/family Time For Goal Achievement: 12/19/11 Potential to Achieve Goals: Good Pt will go Supine/Side to Sit: with modified independence;with HOB 0 degrees PT Goal: Supine/Side to Sit - Progress: Goal set today Pt will go Sit to Supine/Side: with modified independence;with HOB 0 degrees PT Goal: Sit to Supine/Side - Progress: Goal set today Pt will go Sit to Stand: with supervision PT Goal: Sit to Stand - Progress: Goal set today Pt will go Stand to Sit: with supervision PT Goal: Stand to Sit - Progress: Goal set today Pt will Go Up / Down Stairs: 3-5 stairs;with rail(s);with min assist PT Goal: Up/Down Stairs - Progress: Goal set today  Visit  Information  Last PT Received On: 12/05/11 Assistance Needed: +1    Subjective Data  Subjective: Pt's  family would like to persue inpatient rehab if at all possible.   Patient Stated Goal: to get strong enough to go home   Prior Functioning  Home Living Lives With: Significant other Available Help at Discharge: Friend(s);Family;Available 24 hours/day Type of Home: House Home Access: Stairs to enter [employer] of Steps: 3 Entrance Stairs-Rails: Right;Left;Can reach both Home Layout: One level (with 2 steps down into the den) Bathroom Shower/Tub: Walk-in shower;Door [employer] Toilet: Standard Home Adaptive Equipment: Bedside commode/3-in-1;Walker - rolling;Shower chair with back Additional Comments: PTA pt walked with a single point cane and did not use O2 Prior Function Level of Independence: Independent with assistive device(s) Able to Take Stairs?: Yes Driving: Yes Communication Communication: Expressive difficulties    Cognition  Overall Cognitive Status: Impaired Area of Impairment: Memory;Problem solving Arousal/Alertness: Awake/alert Orientation Level: Disoriented to;Time (could not say the year) Memory Deficits: decreased STM Problem Solving: slow to process information.  Difficulty following multi step commands.   Cognition - Other Comments: family quick to jump in with answers.      Extremity/Trunk Assessment Right Lower Extremity Assessment RLE ROM/Strength/Tone: Deficits RLE ROM/Strength/Tone Deficits: grossly 3- to 3/5 per functional assessment.  No obvious asymmetry noted.   Left Lower Extremity Assessment LLE ROM/Strength/Tone: Deficits LLE ROM/Strength/Tone Deficits: grossly 3- to 3/5 per functional assessment.     Balance Static Sitting Balance Static Sitting - Balance Support: Bilateral upper extremity supported;Feet supported Static Sitting - Level of Assistance: 4: Min assist Static Sitting - Comment/# of Minutes: sat EOB x 5 mins while pt became acclimitized to sitting.  Pt tends to lean posteriorly and right in sitting.    End of Session PT -  End of Session Activity Tolerance: Patient limited by fatigue Patient left: in chair;with call bell/phone within reach;with family/visitor present Nurse Communication: Mobility status;Other (comment) Manufacturing engineer- one person assist OOB 3x/day)      Kenyana Husak B. Sherri Levenhagen, PT, DPT [phone]   12/05/2011, 3:43 PM

## 2011-12-06 DIAGNOSIS — E119 Type 2 diabetes mellitus without complications: Secondary | ICD-10-CM

## 2011-12-06 DIAGNOSIS — R569 Unspecified convulsions: Secondary | ICD-10-CM

## 2011-12-06 DIAGNOSIS — I6330 Cerebral infarction due to thrombosis of unspecified cerebral artery: Secondary | ICD-10-CM

## 2011-12-06 DIAGNOSIS — R269 Unspecified abnormalities of gait and mobility: Secondary | ICD-10-CM

## 2011-12-06 LAB — BASIC METABOLIC PANEL
BUN: 27 mg/dL — ABNORMAL HIGH (ref 6–23)
Chloride: 108 mEq/L (ref 96–112)
GFR calc Af Amer: 71 mL/min — ABNORMAL LOW (ref >90)
GFR calc non Af Amer: 62 mL/min — ABNORMAL LOW (ref >90)
Glucose, Bld: 98 mg/dL (ref 70–99)
Potassium: 3.3 mEq/L — ABNORMAL LOW (ref 3.5–5.1)
Sodium: 145 mEq/L (ref 135–145)

## 2011-12-06 LAB — GLUCOSE, CAPILLARY
Glucose-Capillary: 100 mg/dL — ABNORMAL HIGH (ref 70–99)
Glucose-Capillary: 109 mg/dL — ABNORMAL HIGH (ref 70–99)
Glucose-Capillary: 163 mg/dL — ABNORMAL HIGH (ref 70–99)
Glucose-Capillary: 137 mg/dL — ABNORMAL HIGH (ref 70–99)

## 2011-12-06 LAB — CBC
HCT: 38.2 % — ABNORMAL LOW (ref 39.0–52.0)
Hemoglobin: 11.7 g/dL — ABNORMAL LOW (ref 13.0–17.0)
MCHC: 30.6 g/dL (ref 30.0–36.0)
RBC: 4.53 MIL/uL (ref 4.22–5.81)

## 2011-12-06 LAB — PROTIME-INR
INR: 1.09 (ref 0.00–1.49)
Prothrombin Time: 14 seconds (ref 11.6–15.2)

## 2011-12-06 MED ORDER — SODIUM CHLORIDE 0.9 % IJ SOLN
10.0000 mL | INTRAMUSCULAR | Status: DC | PRN
  Administered 2011-12-06 (×3): 10 mL

## 2011-12-06 MED ORDER — WARFARIN SODIUM 10 MG PO TABS
10.0000 mg | ORAL_TABLET | Freq: Once | ORAL | Status: AC
  Administered 2011-12-06: 10 mg via ORAL
  Filled 2011-12-06: qty 1

## 2011-12-06 MED ORDER — TERAZOSIN HCL 2 MG PO CAPS
2.0000 mg | ORAL_CAPSULE | Freq: Every day | ORAL | Status: DC
  Administered 2011-12-06 – 2011-12-08 (×3): 2 mg via ORAL
  Filled 2011-12-06 (×3): qty 1

## 2011-12-06 MED ORDER — POTASSIUM CHLORIDE CRYS ER 20 MEQ PO TBCR
40.0000 meq | EXTENDED_RELEASE_TABLET | Freq: Once | ORAL | Status: AC
  Administered 2011-12-06: 40 meq via ORAL
  Filled 2011-12-06: qty 2

## 2011-12-06 MED ORDER — LEVETIRACETAM 500 MG PO TABS
500.0000 mg | ORAL_TABLET | Freq: Two times a day (BID) | ORAL | Status: DC
  Administered 2011-12-06 – 2011-12-08 (×4): 500 mg via ORAL
  Filled 2011-12-06 (×5): qty 1

## 2011-12-06 MED ORDER — SIMVASTATIN 20 MG PO TABS
20.0000 mg | ORAL_TABLET | Freq: Every day | ORAL | Status: DC
  Administered 2011-12-06 – 2011-12-08 (×3): 20 mg via ORAL
  Filled 2011-12-06 (×3): qty 1

## 2011-12-06 MED ORDER — LEVETIRACETAM 500 MG PO TABS
500.0000 mg | ORAL_TABLET | Freq: Two times a day (BID) | ORAL | Status: DC
  Filled 2011-12-06 (×2): qty 1

## 2011-12-06 NOTE — Progress Notes (Signed)
Nutrition Follow-up  Intervention:    No nutrition intervention at this time -- pt declined RD to follow for nutrition care plan  Assessment:   Patient extubated 9/30. States his appetite is OK. No % meal intake recorded in flowsheet records. Per patient, intake approximately 50%. Declining addition of snacks or supplements. PT recommending [facility].  Diet Order:  Regular  Meds: Scheduled Meds:   . antiseptic oral rinse  15 mL Mouth Rinse QID  . chlorhexidine  15 mL Mouth Rinse BID  . levETIRAcetam  500 mg Oral BID  . potassium chloride  40 mEq Oral Once  . simvastatin  20 mg Oral Daily  . terazosin  2 mg Oral Daily  . warfarin  10 mg Oral ONCE-1800  . warfarin  10 mg Oral ONCE-1800  . Warfarin - Pharmacist Dosing Inpatient   Does not apply q1800  . DISCONTD: insulin aspart  2-6 Units Subcutaneous Q4H  . DISCONTD: levetiracetam  500 mg Intravenous Q12H  . DISCONTD: levETIRAcetam  500 mg Oral BID   Continuous Infusions:  PRN Meds:.sodium chloride, acetaminophen, sodium chloride, DISCONTD: acetaminophen  Labs:  CMP     Component Value Date/Time   NA 145 12/06/2011 0500   K 3.3* 12/06/2011 0500   CL 108 12/06/2011 0500   CO2 30 12/06/2011 0500   GLUCOSE 98 12/06/2011 0500   BUN 27* 12/06/2011 0500   CREATININE 1.09 12/06/2011 0500   CALCIUM 8.9 12/06/2011 0500   PROT 8.3 11/28/2011 1440   ALBUMIN 3.1* 11/28/2011 1440   AST 19 11/28/2011 1440   ALT 9 11/28/2011 1440   ALKPHOS 44 11/28/2011 1440   BILITOT 0.4 11/28/2011 1440   GFRNONAA 62* 12/06/2011 0500   GFRAA 71* 12/06/2011 0500    CBG (last 3)   Basename 12/06/11 0730 12/06/11 0420 12/06/11 0030  GLUCAP 86 96 109*    Weight Status:  106 kg (10/2 ) -- fluctuating   Re-estimated needs:  1900-2100 kcals, 90-100 gm protein  Nutrition Dx:  Inadequate Oral Intake r/t decreased appetite as evidenced by PO intake 50%, ongoing  New Goal:  Oral intake with meals to meet >/= 90% of estimated nutrition needs, unmet  Monitor:   PO intake, weight, labs, I/O's  Kirkland Hun, RD, LDN Pager #: [phone] After-Hours Pager #: [phone]

## 2011-12-06 NOTE — Progress Notes (Signed)
Name: Terry Robertson MRN: [id number] DOB: 1928/03/14    LOS: 8  Referring Provider:  EDP -  Reason for Referral:    PULMONARY / CRITICAL CARE MEDICINE  HPI:  78 y/o M with PMH of CAD, HTN, CVA, ? DM (on no meds) who was noted on 9/23 to be driving erratically, went home and EMS was notified and found him sitting in a chair unable to speak but awake with right sided gaze, right arm twitching.  In ED upon arrival, was noted to have seizure. Further decompensated and required intubation per EDP.  Concern for new CVA.  PCCM consulted for ICU admit.     Subjective/interval denies CP, dyspnea Did well with PT  Vital Signs: Temp:  [97.9 F (36.6 C)-98.2 F (36.8 C)] 98.2 F (36.8 C) (10/02 0400) Pulse Rate:  [51-72] 61  (10/02 0400) Resp:  [17-18] 17  (10/02 0400) BP: (130-151)/(69-79) 130/69 mmHg (10/02 0400) SpO2:  [94 %-98 %] 98 % (10/02 0400) Weight:  [106.187 kg (234 lb 1.6 oz)] 106.187 kg (234 lb 1.6 oz) (10/02 0500)  Physical Examination: General:  Elderly male in NAD Neuro: awake, interactive, non focal HEENT:  mm pink/moist, good cough Cardiovascular:  s1s2 rrr, no m/r/g Lungs:  Clear, no rhonchi Abdomen:  Round/soft, bsx4 active Musculoskeletal:  No acute deformities Skin:  Warm/dry, no edema  Active Problems:  Acute respiratory failure  Altered mental status  Seizure  Status epilepticus, generalized convulsive   ASSESSMENT AND PLAN  NEUROLOGIC  A:   Acute Seizure - felt by neurology to be due to multiple etiology including multi-lobe old infarcts, they did not think he had new CVA -EEG noted no seizure. -MRI noted no new strokes 76 old ones however.  P:   -continue Keppra -change to PO  -Neurology fu as outpt for duration of therapy   PULMONARY  Lab 12/02/11 0345  PHART 7.324*  PCO2ART 44.3  PO2ART 107.0*  HCO3 22.4  O2SAT 97.5   Ventilator Settings:   CXR:  9/29 right basilar atelectasis, otherwise improving film/aeration  ETT:   9/23>>>9/26>>>9/26>>>9/30  A:   Acute Respiratory Failure -initially in setting of neurological process but now due to upper airway obstruction, patient was a very difficult airway and multiple attempts at intubation in the ED that resulted in significant upper airway trauma.  Was extubated on 9/26 but had to be reintubated due to stridor.  Upon intubating the patient it was noted that there was significant edema in the upper airway resulting in upper airway compromise. Negative leak test on endotracheal cuff 9/29 Hemoptysis Likely due to upper airway trauma as well as anti-coagulation. This has resolved  P:   CIR consult  CARDIOVASCULAR No results found for this basename: TROPONINI:5,LATICACIDVEN:5, O2SATVEN:5,PROBNP:5 in the last 168 hours ECG:   Lines:  9/26 right IJ CVL>>> A:  Hx of Afib - diagnosed in 08/2011 as new onset.  He is currently in normal sinus rhythm Hx of HTN Hx of HLD  P:  -rate controlled -resume atenolol & cardizem at some point -resume zocor. - resume coumadin  RENAL  Lab 12/06/11 0500 12/05/11 0440 12/04/11 0315 12/03/11 0500 12/02/11 0415 12/01/11 0400 11/30/11 1742 11/30/11 0504  NA 145 153* 147* 148* 142 -- -- --  K 3.3* 4.1 -- -- -- -- -- --  CL 108 115* 111 114* 109 -- -- --  CO2 30 31 27 27 24  -- -- --  BUN 27* 33* 39* 35* 33* -- -- --  CREATININE 1.09  1.03 0.97 1.00 1.20 -- -- --  CALCIUM 8.9 9.7 9.4 9.3 9.1 -- -- --  MG -- -- -- -- 2.5 2.5 2.1 2.2  PHOS -- -- -- -- 2.9 3.7 3.9 3.6   Intake/Output      10/01 0701 - 10/02 0700 10/02 0701 - 10/03 0700   I.V. (mL/kg) 40 (0.4)    NG/GT     IV Piggyback 105    Total Intake(mL/kg) 145 (1.4)    Urine (mL/kg/hr) 230 (0.1)    Total Output 230    Net -85         Urine Occurrence 1 x     No intake or output data in the 24 hours ending 12/06/11 0949 Foley:  9/23  A:   Hypernatremia hypokalemia  P:  Replete K   GASTROINTESTINAL No results found for this basename:  AST:5,ALT:5,ALKPHOS:5,BILITOT:5,PROT:5,ALBUMIN:5 in the last 168 hours A:   nutrition  P:   -reg diet -dc PPI.  HEMATOLOGIC  Lab 12/06/11 0500 12/04/11 0753 12/04/11 0315 12/03/11 0500 12/02/11 0855 12/02/11 0500 12/02/11 0415 12/01/11 0400  HGB 11.7* -- 12.2* 11.9* -- -- 12.0* 12.4*  HCT 38.2* -- 39.5 39.1 -- -- 38.5* 40.0  PLT 133* -- 126* 126* -- -- 102* 93*  INR 1.09 1.18 -- 1.14 1.20 1.20 -- --  APTT -- -- -- -- -- -- -- --   A:   Airway bleeding - in setting of anticoagulation  P:  -Anticoagulation as discussed above.  INFECTIOUS  Lab 12/06/11 0500 12/04/11 0315 12/03/11 0500 12/02/11 0415 12/01/11 0400  WBC 3.7* 4.6 5.3 7.2 8.2  PROCALCITON -- <0.10 0.15 -- --   Cultures: 9/23 Bcx2>>>neg9/24 Sputum>>nonpathogenic oropharyngeal-type flora 9/26 Blood: coag neg staph 1/2 9/26 Urine>>>negative  Antibiotics: vanc 9/27>>>9/28  A: coag negative staph blood culture This was coag negative staph, one out of 2 samples, doubt that this was a legitimate sample in likely reflects contamination, low pct reassuring P:   -Off antibiotics   ENDOCRINE  Lab 12/06/11 0730 12/06/11 0420 12/06/11 0030 12/05/11 2001 12/05/11 1626  GLUCAP 86 96 109* 100* 91   A:   Hx of DM - questionable diagnosis as pt is not on any home medications for DM ?steroid induced   P:   -ICU SSI -dc Lantus (now that off steroids)   Cyril Mourning MD. FCCP.  Pulmonary & Critical care Pager [phone] If no response call [phone]

## 2011-12-06 NOTE — Consult Note (Signed)
Physical Medicine and Rehabilitation Consult Reason for Consult: Seizures Referring Physician:  Dr. Vassie Loll   HPI: Terry Robertson is a 78 y.o. male who recently suffered a moderate sized non hemorrhagic infarct of the  posterior left opercular region into the posterior left frontal - parietal lobe on 08/05/11. Patient has known A-fib and was on Coumadin prior to CVA. On 11/27/11 to be driving erratically, went home and EMS was notified and found him sitting in a chair unable to speak but awake with right sided gaze, right arm twitching. In ED upon arrival, was noted to have seizure. Further decompensated and required intubation (difficulty intubation with multiple attempts) per EDP. While in the ED patient had 2 TC seizures. Patient was loaded with Keppra 1 gram and 500 mg IV BID was initiated. MRI brain done revealing remote left parietal infarct and no acute changes. Neurology felt that paitent with seizures due to multiple etiologies including multiple old infarcts. Tolerated extubation on 09/30 and PT evaluation done yesterday. MD, PT recommending inpatient rehab.  Review of Systems  Eyes: Negative for blurred vision.  Respiratory: Negative for shortness of breath.   Cardiovascular: Negative for chest pain.  Gastrointestinal: Positive for constipation.  Neurological: Positive for weakness. Negative for headaches.   Past Medical History  Diagnosis Date  . Coronary artery disease   . Diabetes mellitus   . Hypertension   . Stroke    Past Surgical History  Procedure Date  . Cardiac surgery    History reviewed. No pertinent family history.  Social History:  Single. Sons live with him. Independent with cane PTA. Per reports that he quit smoking about 23 years ago. His smoking use included Cigarettes. He does not have any smokeless tobacco history on file. Per reports that he does not use illicit drugs. His alcohol history not on file. Friend helps with meals, medication management and bills.  She'll  be there to assist past discharge.   Allergies: No Known Allergies Medications Prior to Admission  Medication Sig Dispense Refill  . aspirin EC 81 MG tablet Take 81 mg by mouth daily.      Marland Kitchen atenolol (TENORMIN) 12.5 mg TABS Take 0.5 tablets (12.5 mg total) by mouth daily.  303 tablet  5  . diltiazem (CARDIZEM CD) 180 MG 24 hr capsule Take 1 capsule (180 mg total) by mouth daily.  30 capsule  5  . lisinopril (PRINIVIL,ZESTRIL) 10 MG tablet Take 1 tablet (10 mg total) by mouth daily.  303 tablet  5  . omega-3 acid ethyl esters (LOVAZA) 1 G capsule Take 2 g by mouth 2 (two) times daily.       . simvastatin (ZOCOR) 20 MG tablet Take 20 mg by mouth daily.      Marland Kitchen terazosin (HYTRIN) 2 MG capsule Take 2 mg by mouth daily.      Marland Kitchen warfarin (COUMADIN) 5 MG tablet Take 5 mg by mouth daily. 10 mg (2 tabs) on Monday, 7.5 mg (1 1/2 tabs) on all other days of the week      . nitroGLYCERIN (NITROSTAT) 0.4 MG SL tablet Place 1 tablet (0.4 mg total) under the tongue every 5 (five) minutes x 3 doses as needed for chest pain.  25 tablet  5    Home: Home Living Lives With: Significant other Available Help at Discharge: Friend(s);Family;Available 24 hours/day Type of Home: House Home Access: Stairs to enter [employer] of Steps: 3 Entrance Stairs-Rails: Right;Left;Can reach both Home Layout: One level (with 2 steps down  into the den) Bathroom Shower/Tub: [employer];Door [employer] Toilet: Standard Home Adaptive Equipment: Bedside commode/3-in-1;Walker - rolling;Shower chair with back Additional Comments: PTA pt walked with a single point cane and did not use O2  Functional History: Prior Function Able to Take Stairs?: Yes Driving: Yes Functional Status:  Mobility: Bed Mobility Bed Mobility: Supine to Sit;Sitting - Scoot to Edge of Bed Supine to Sit: 3: Mod assist;HOB elevated;With rails Sitting - Scoot to Edge of Bed: 4: Min assist;With rail Transfers Transfers: Sit to Stand;Stand to  Sit Sit to Stand: 3: Mod assist;With upper extremity assist;From bed Stand to Sit: 3: Mod assist;With upper extremity assist;To chair/3-in-1 Ambulation/Gait Ambulation/Gait Assistance: 3: Mod assist Ambulation Distance (Feet): 20 Feet Assistive device: Rolling walker Ambulation/Gait Assistance Details: mod assist to weight shift to the left, keep pt's trunk balanced and in midline and to help steer Rw.  Pt with flexed posture family reports this was premorbid.   Gait Pattern: Step-through pattern;Shuffle;Trunk flexed;Lateral trunk lean to right Gait velocity: less than 1.8 ft/sec which puts him at risk for recurrent falls.      ADL:    Cognition: Cognition Arousal/Alertness: Awake/alert Orientation Level: Oriented X4 Cognition Overall Cognitive Status: Impaired Area of Impairment: Memory;Problem solving Arousal/Alertness: Awake/alert Orientation Level: Disoriented to;Time (could not say the year) Memory Deficits: decreased STM Problem Solving: slow to process information.  Difficulty following multi step commands.   Cognition - Other Comments: family quick to jump in with answers.    Blood pressure 128/73, pulse 53, temperature 97.5 F (36.4 C), temperature source Oral, resp. rate 17, height 5\' 5"  (1.651 m), weight 106.187 kg (234 lb 1.6 oz), SpO2 98.00%. Physical Exam  Nursing note and vitals reviewed. Constitutional: He is oriented to person, place, and time. He appears well-developed and well-nourished.  HENT:  Head: Normocephalic.  Eyes: Pupils are equal, round, and reactive to light.  Neck: Normal range of motion.  Pulmonary/Chest: Effort normal and breath sounds normal.  Abdominal: Soft. Bowel sounds are normal.  Neurological: He is alert and oriented to person, place, and time.       Flat affect with delayed responses.  Unable to follow two step commands. Moves all four. With mild facial weakness on the left. Could identify most simple objects. Was delayed in telling me  why he was here. Struggled with the date. Slow to process in general.    Skin: Skin is warm and dry.  Psychiatric: He has a normal mood and affect. His behavior is normal.    Results for orders placed during the hospital encounter of 11/28/11 (from the past 24 hour(s))  GLUCOSE, CAPILLARY     Status: Abnormal   Collection Time   12/05/11 11:56 AM      Component Value Range   Glucose-Capillary 128 (*) 70 - 99 mg/dL  GLUCOSE, CAPILLARY     Status: Normal   Collection Time   12/05/11  4:26 PM      Component Value Range   Glucose-Capillary 91  70 - 99 mg/dL  GLUCOSE, CAPILLARY     Status: Abnormal   Collection Time   12/05/11  8:01 PM      Component Value Range   Glucose-Capillary 100 (*) 70 - 99 mg/dL   Comment 1 Notify RN    GLUCOSE, CAPILLARY     Status: Abnormal   Collection Time   12/06/11 12:30 AM      Component Value Range   Glucose-Capillary 109 (*) 70 - 99 mg/dL   Comment 1  Notify RN    GLUCOSE, CAPILLARY     Status: Normal   Collection Time   12/06/11  4:20 AM      Component Value Range   Glucose-Capillary 96  70 - 99 mg/dL   Comment 1 Notify RN    BASIC METABOLIC PANEL     Status: Abnormal   Collection Time   12/06/11  5:00 AM      Component Value Range   Sodium 145  135 - 145 mEq/L   Potassium 3.3 (*) 3.5 - 5.1 mEq/L   Chloride 108  96 - 112 mEq/L   CO2 30  19 - 32 mEq/L   Glucose, Bld 98  70 - 99 mg/dL   BUN 27 (*) 6 - 23 mg/dL   Creatinine, Ser 8.29  0.50 - 1.35 mg/dL   Calcium 8.9  8.4 - 56.2 mg/dL   GFR calc non Af Amer 62 (*) >90 mL/min   GFR calc Af Amer 71 (*) >90 mL/min  CBC     Status: Abnormal   Collection Time   12/06/11  5:00 AM      Component Value Range   WBC 3.7 (*) 4.0 - 10.5 K/uL   RBC 4.53  4.22 - 5.81 MIL/uL   Hemoglobin 11.7 (*) 13.0 - 17.0 g/dL   HCT 13.0 (*) 86.5 - 78.4 %   MCV 84.3  78.0 - 100.0 fL   MCH 25.8 (*) 26.0 - 34.0 pg   MCHC 30.6  30.0 - 36.0 g/dL   RDW 69.6  29.5 - 28.4 %   Platelets 133 (*) 150 - 400 K/uL  PROTIME-INR      Status: Normal   Collection Time   12/06/11  5:00 AM      Component Value Range   Prothrombin Time 14.0  11.6 - 15.2 seconds   INR 1.09  0.00 - 1.49  GLUCOSE, CAPILLARY     Status: Normal   Collection Time   12/06/11  7:30 AM      Component Value Range   Glucose-Capillary 86  70 - 99 mg/dL   Comment 1 Notify RN     Dg Chest Port 1 View  12/05/2011  *RADIOLOGY REPORT*  Clinical Data: The edema, pneumonia.  PORTABLE CHEST - 1 VIEW  Comparison: 12/04/2011  Findings: Right central line remains in place, unchanged. Cardiomegaly.  Improving right basilar atelectasis or infiltrate. No focal opacity on the left.  No effusions.  No acute bony abnormality.  IMPRESSION: Slight improvement right basilar atelectasis or infiltrate.   Original Report Authenticated By: Cyndie Chime, M.D.     Assessment/Plan: Diagnosis: hx of left CVA, new onset seizures, intubation, prolonged hospital course with relative deconditioning 1. Does the need for close, 24 hr/day medical supervision in concert with the patient's rehab needs make it unreasonable for this patient to be served in a less intensive setting? Yes 2. Co-Morbidities requiring supervision/potential complications: CAD, morbid obesity, HTN, DM 3. Due to bladder management, bowel management, safety, skin/wound care, disease management, medication administration, pain management and patient education, does the patient require 24 hr/day rehab nursing? Yes 4. Does the patient require coordinated care of a physician, rehab nurse, PT (1-2 hrs/day, 5 days/week), OT (1-2 hrs/day, 5 days/week) and SLP (1-2 hrs/day, 5 days/week) to address physical and functional deficits in the context of the above medical diagnosis(es)? Yes Addressing deficits in the following areas: balance, endurance, locomotion, strength, transferring, bowel/bladder control, bathing, dressing, feeding, grooming, toileting, cognition, speech, language, swallowing and  psychosocial support 5. Can  the patient actively participate in an intensive therapy program of at least 3 hrs of therapy per day at least 5 days per week? Yes 6. The potential for patient to make measurable gains while on inpatient rehab is excellent 7. Anticipated functional outcomes upon discharge from inpatient rehab are supervision to min assist with PT, supervision to min assist with OT, supervision to min assist with SLP. 8. Estimated rehab length of stay to reach the above functional goals is: 1-2 weeks 9. Does the patient have adequate social supports to accommodate these discharge functional goals? Yes 10. Anticipated D/C setting: Home 11. Anticipated post D/C treatments: HH therapy 12. Overall Rehab/Functional Prognosis: excellent  RECOMMENDATIONS: This patient's condition is appropriate for continued rehabilitative care in the following setting: CIR Patient has agreed to participate in recommended program. Yes Note that insurance prior authorization may be required for reimbursement for recommended care.  Comment: Pt had some baseline language deficits but was essentially functioning independently at a household level. Was even driving per friend. Would benefit from an inpatient rehab admit to help him return close to those premorbid levels. Rehab RN to follow up.   Ivory Broad, MD     12/06/2011

## 2011-12-06 NOTE — Progress Notes (Signed)
Patient would benefit from an inpt rehab admission prior to d/c home. I need an OT assessment to be able to pursue insurance approval for admit with [facility]. Please order in a.m. I will follow up. Please call for any questions. [phone]

## 2011-12-06 NOTE — Progress Notes (Signed)
ANTICOAGULATION CONSULT NOTE - Follow Up Consult  Pharmacy Consult for Coumadin Indication: atrial fibrillation  No Known Allergies  Patient Measurements: Height: 5\' 5"  (165.1 cm) Weight: 234 lb 1.6 oz (106.187 kg) IBW/kg (Calculated) : 61.5  Vital Signs: Temp: 97.5 F (36.4 C) (10/02 0800) Temp src: Oral (10/02 0800) BP: 128/73 mmHg (10/02 0800) Pulse Rate: 53  (10/02 0800)  Labs:  Basename 12/06/11 0500 12/05/11 0440 12/04/11 0753 12/04/11 0315 12/03/11 2330 12/03/11 1720  HGB 11.7* -- -- 12.2* -- --  HCT 38.2* -- -- 39.5 -- --  PLT 133* -- -- 126* -- --  APTT -- -- -- -- -- --  LABPROT 14.0 -- 14.8 -- -- --  INR 1.09 -- 1.18 -- -- --  HEPARINUNFRC -- -- 0.74* -- 0.58 0.35  CREATININE 1.09 1.03 -- 0.97 -- --  CKTOTAL -- -- -- -- -- --  CKMB -- -- -- -- -- --  TROPONINI -- -- -- -- -- --   Estimated Creatinine Clearance: 59.7 ml/min (by C-G formula based on Cr of 1.09).  Medications:  Scheduled:    . antiseptic oral rinse  15 mL Mouth Rinse QID  . chlorhexidine  15 mL Mouth Rinse BID  . levETIRAcetam  500 mg Oral BID  . potassium chloride  40 mEq Oral Once  . simvastatin  20 mg Oral Daily  . terazosin  2 mg Oral Daily  . warfarin  10 mg Oral ONCE-1800  . Warfarin - Pharmacist Dosing Inpatient   Does not apply q1800  . DISCONTD: insulin aspart  2-6 Units Subcutaneous Q4H  . DISCONTD: levetiracetam  500 mg Intravenous Q12H   Assessment: 78yo male restarted on chronic coumadin for atrial fibrillation. Patient was transferred from 3100 after being on heparin, admitted on 9/23 with a seizure and concern for new CVA. Patients PTA dose of coumadin is 10mg  on Mondays and 7.5mg  on all other days. INR today is 1.09 (decreased). Dose charted given on 10/1. CBC stable. No bleeding noted.   Goal of Therapy:  INR 2-3 Monitor platelets by anticoagulation protocol: Yes   Plan:  1. Coumadin 10mg  again tonight. 2. Follow-up PT/INR in AM.   Lonzo Cloud, Gala Lewandowsky 12/06/2011,10:17 AM

## 2011-12-07 LAB — PROTIME-INR
INR: 1.29 (ref 0.00–1.49)
Prothrombin Time: 15.8 seconds — ABNORMAL HIGH (ref 11.6–15.2)

## 2011-12-07 LAB — CULTURE, BLOOD (ROUTINE X 2): Culture: NO GROWTH

## 2011-12-07 MED ORDER — WARFARIN SODIUM 10 MG PO TABS
10.0000 mg | ORAL_TABLET | Freq: Once | ORAL | Status: AC
  Administered 2011-12-07: 10 mg via ORAL
  Filled 2011-12-07: qty 1

## 2011-12-07 NOTE — Evaluation (Signed)
Occupational Therapy Evaluation Patient Details Name: Terry Robertson MRN: [id number] DOB: Feb 20, 1929 Today's Date: 12/07/2011 Time: 5643-3295 OT Time Calculation (min): 18 min  OT Assessment / Plan / Recommendation Clinical Impression  Pt admitted with seizure activity with subsequent VDRF and extubated 9/30. Pt presents with cognitive, mobility and ADL deficits and will benefit from skilled OT in the acute setting followed by CIR to maximize I with ADL and ADL mobility prior to d/c.     OT Assessment  Patient needs continued OT Services    Follow Up Recommendations  Inpatient Rehab    Barriers to Discharge      Equipment Recommendations  None recommended by OT;None recommended by PT    Recommendations for Other Services Rehab consult  Frequency  Min 2X/week    Precautions / Restrictions Precautions Precautions: Fall Restrictions Weight Bearing Restrictions: No   Pertinent Vitals/Pain Pt denies any pain at this time    ADL  Grooming: Performed;Minimal assistance Where Assessed - Grooming: Supported standing Upper Body Bathing: Simulated;Minimal assistance Where Assessed - Upper Body Bathing: Supported sit to stand Lower Body Bathing: Simulated;Maximal assistance Where Assessed - Lower Body Bathing: Supported sit to stand Upper Body Dressing: Simulated;Min guard Where Assessed - Upper Body Dressing: Supported sitting Lower Body Dressing: Performed;Minimal assistance Where Assessed - Lower Body Dressing: Supported sit to Pharmacist, hospital: Counsellor Method: Sit to Barista:  (chair with armrests) Toileting - Clothing Manipulation and Hygiene: Simulated;Minimal assistance Where Assessed - Toileting Clothing Manipulation and Hygiene: Sit to stand from 3-in-1 or toilet Transfers/Ambulation Related to ADLs: Pt Min guard A with ambulation in room ~96ft forward and back to chair.  ADL Comments: Pt with slow processing    OT  Diagnosis: Generalized weakness  OT Problem List: Decreased strength;Decreased activity tolerance;Decreased safety awareness;Decreased knowledge of use of DME or AE;Decreased knowledge of precautions;Decreased cognition OT Treatment Interventions: Self-care/ADL training;DME and/or AE instruction;Therapeutic activities;Patient/family education;Balance training;Cognitive remediation/compensation   OT Goals Acute Rehab OT Goals OT Goal Formulation: With patient Time For Goal Achievement: 12/21/11 Potential to Achieve Goals: Good ADL Goals Pt Will Perform Grooming: Independently;Standing at sink ADL Goal: Grooming - Progress: Goal set today Pt Will Perform Lower Body Bathing: with set-up;Sit to stand in shower ADL Goal: Lower Body Bathing - Progress: Goal set today Pt Will Perform Upper Body Dressing: Independently;Sitting, bed;Sitting, chair ADL Goal: Upper Body Dressing - Progress: Goal set today Pt Will Perform Lower Body Dressing: Sit to stand from bed;Sit to stand from chair;with set-up ADL Goal: Lower Body Dressing - Progress: Goal set today Pt Will Transfer to Toilet: with modified independence;Ambulation;with DME ADL Goal: Toilet Transfer - Progress: Goal set today Pt Will Perform Toileting - Clothing Manipulation: Independently;Standing ADL Goal: Toileting - Clothing Manipulation - Progress: Goal set today Pt Will Perform Toileting - Hygiene: with set-up;Sit to stand from 3-in-1/toilet ADL Goal: Toileting - Hygiene - Progress: Goal set today Pt Will Perform Tub/Shower Transfer: Shower transfer;with supervision;Ambulation ADL Goal: Tub/Shower Transfer - Progress: Goal set today  Visit Information  Last OT Received On: 12/07/11 Assistance Needed: +1    Subjective Data  Subjective: I just walked with that other therapist. Patient Stated Goal: Return home   Prior Functioning     Home Living Lives With: Significant other Available Help at Discharge: Friend(s);Family;Available  24 hours/day Type of Home: House Home Access: Stairs to enter [employer] of Steps: 3 Entrance Stairs-Rails: Right;Left;Can reach both Home Layout: One level Bathroom Shower/Tub: Walk-in shower;Door [employer]:  Standard Home Adaptive Equipment: Bedside commode/3-in-1;Walker - rolling;Shower chair with back Additional Comments: PTA pt walked with a single point cane and did not use O2 Prior Function Level of Independence: Independent with assistive device(s) Able to Take Stairs?: Yes Driving: Yes Communication Communication: Expressive difficulties (word finding) Dominant Hand: Right         Vision/Perception     Cognition  Overall Cognitive Status: Impaired Area of Impairment: Memory;Problem solving Arousal/Alertness: Awake/alert Orientation Level: Disoriented to;Time (could not say the year) Memory Deficits: decreased STM Problem Solving: slow to process information.  Difficulty following multi step commands.   Cognition - Other Comments: friend endorses this is worse than pt's baseline    Extremity/Trunk Assessment Right Upper Extremity Assessment RUE ROM/Strength/Tone: Within functional levels RUE Sensation: WFL - Light Touch RUE Coordination: WFL - gross/fine motor Left Upper Extremity Assessment LUE ROM/Strength/Tone: Within functional levels LUE Sensation: WFL - Light Touch LUE Coordination: WFL - gross/fine motor     Mobility Bed Mobility Bed Mobility: Not assessed Transfers Sit to Stand: 4: Min assist;With upper extremity assist;From chair/3-in-1;With armrests Stand to Sit: 4: Min guard;With upper extremity assist;To chair/3-in-1;With armrests Details for Transfer Assistance: cues for sequencing and hand placement     Shoulder Instructions     Exercise     Balance Balance Balance Assessed: No   End of Session OT - End of Session Equipment Utilized During Treatment: Gait belt Activity Tolerance: Patient limited by fatigue Patient  left: in chair;with call bell/phone within reach Nurse Communication: Mobility status  GO     Connelly [place] 12/07/2011, 3:25 PM

## 2011-12-07 NOTE — Progress Notes (Signed)
ANTICOAGULATION CONSULT NOTE - Follow Up Consult  Pharmacy Consult for Coumadin Indication: atrial fibrillation  No Known Allergies  Patient Measurements: Height: 5\' 5"  (165.1 cm) Weight: 240 lb 12.8 oz (109.226 kg) IBW/kg (Calculated) : 61.5  Vital Signs: Temp: 98.4 F (36.9 C) (10/03 0800) Temp src: Oral (10/03 0400) BP: 131/69 mmHg (10/03 0800) Pulse Rate: 77  (10/03 0800)  Labs:  Basename 12/07/11 0426 12/06/11 0500 12/05/11 0440  HGB -- 11.7* --  HCT -- 38.2* --  PLT -- 133* --  APTT -- -- --  LABPROT 15.8* 14.0 --  INR 1.29 1.09 --  HEPARINUNFRC -- -- --  CREATININE -- 1.09 1.03  CKTOTAL -- -- --  CKMB -- -- --  TROPONINI -- -- --   Estimated Creatinine Clearance: 60.6 ml/min (by C-G formula based on Cr of 1.09).  Medications:  Scheduled:     . antiseptic oral rinse  15 mL Mouth Rinse QID  . chlorhexidine  15 mL Mouth Rinse BID  . levETIRAcetam  500 mg Oral BID  . simvastatin  20 mg Oral Daily  . terazosin  2 mg Oral Daily  . warfarin  10 mg Oral ONCE-1800  . Warfarin - Pharmacist Dosing Inpatient   Does not apply q1800   Assessment: 78yo male restarted on chronic coumadin for atrial fibrillation. Patient was transferred from 3100 after being on heparin, admitted on 9/23 with a seizure and concern for new CVA. Patients PTA dose of coumadin is 10mg  on Mondays and 7.5mg  on all other days. INR today is 1.29 (increasing). Dose charted given on 10/2. CBC stable-no repeat today. No bleeding noted.   Goal of Therapy:  INR 2-3 Monitor platelets by anticoagulation protocol: Yes   Plan:  1. Coumadin 10mg  again tonight - Note patient was sub-therapeutic on admission at 1.63. Recommend sending home on 10mg  Monday and Thursday and 7.5mg  all other days with follow-up INR in 3-4 days.  2. Follow-up PT/INR in AM.   Fayne Norrie 12/07/2011,12:30 PM

## 2011-12-07 NOTE — Progress Notes (Signed)
Physical Therapy Treatment Patient Details Name: Terry Robertson MRN: [id number] DOB: 26-Apr-1928 Today's Date: 12/07/2011 Time: 4098-1191 PT Time Calculation (min): 24 min  PT Assessment / Plan / Recommendation Comments on Treatment Session  pt presentsw ith Seizure and VDRF.  pt much improved today and eager to ambulate.      Follow Up Recommendations  Post acute inpatient rehab    Barriers to Discharge        Equipment Recommendations  None recommended by PT    Recommendations for Other Services    Frequency Min 3X/week   Plan Discharge plan remains appropriate;Frequency remains appropriate    Precautions / Restrictions Precautions Precautions: Fall Restrictions Weight Bearing Restrictions: No   Pertinent Vitals/Pain Denies pain.  O2 sats 97-99% on 2L O2 after ambulation.      Mobility  Bed Mobility Bed Mobility: Not assessed Transfers Transfers: Sit to Stand;Stand to Sit Sit to Stand: 4: Min assist;With upper extremity assist;From chair/3-in-1;With armrests Stand to Sit: 4: Min guard;With upper extremity assist;To chair/3-in-1;With armrests Details for Transfer Assistance: cues to get closer to recliner prior to sitting.   Ambulation/Gait Ambulation/Gait Assistance: 4: Min guard Ambulation Distance (Feet): 120 Feet Assistive device: Rolling walker Ambulation/Gait Assistance Details: pt remains very flexed and with cueing attempts upright posture, but unable to maintain.  cues to stay closer to RW and safety with turns.   Gait Pattern: Step-through pattern;Decreased stride length;Trunk flexed Stairs: No Wheelchair Mobility Wheelchair Mobility: No    Exercises     PT Diagnosis:    PT Problem List:   PT Treatment Interventions:     PT Goals Acute Rehab PT Goals Time For Goal Achievement: 12/19/11 PT Goal: Sit to Stand - Progress: Progressing toward goal PT Goal: Stand to Sit - Progress: Progressing toward goal  Visit Information  Last PT Received On:  12/07/11 Assistance Needed: +1    Subjective Data  Subjective: I'm doing better today.     Cognition  Overall Cognitive Status: Impaired Area of Impairment: Memory;Problem solving Arousal/Alertness: Awake/alert Orientation Level: Disoriented to;Time (could not say the year) Memory Deficits: decreased STM Problem Solving: slow to process information.  Difficulty following multi step commands.      Balance  Balance Balance Assessed: No  End of Session PT - End of Session Equipment Utilized During Treatment: Gait belt;Oxygen Activity Tolerance: Patient tolerated treatment well Patient left: in chair;with call bell/phone within reach Nurse Communication: Mobility status   GP     [name], [place] [phone] 12/07/2011, 12:08 PM

## 2011-12-07 NOTE — Discharge Summary (Addendum)
Physician Discharge Summary  Patient ID: Terry Robertson MRN: [id number] DOB/AGE: 1928/08/03 78 y.o.  Admit date: 11/28/2011 Discharge date: 12/07/2011  Problem List Active Problems:  Acute respiratory failure  Altered mental status  Seizure  Status epilepticus, generalized convulsive  HPI: 78 y/o M with PMH of CAD, HTN, CVA, ? DM (on no meds) who was noted on 9/23 to be driving erratically, went home and EMS was notified and found him sitting in a chair unable to speak but awake with right sided gaze, right arm twitching. In ED upon arrival, was noted to have seizure. Further decompensated and required intubation per EDP. Concern for new CVA. PCCM consulted for ICU admit.   Hospital Course:  ASSESSMENT AND PLAN  NEUROLOGIC  A:  Acute Seizure - felt by neurology to be due to multiple etiology including multi-lobe old infarcts, they did not think he had new CVA  -EEG noted no seizure.  -MRI noted no new strokes 70 old ones however.  P:  -continue Keppra -change to PO  -Neurology fu as outpt for duration of therapy . Dr Pearlean Brownie called 12-08-2011, his office will make follow up appointment. PULMONARY   Lab  12/02/11 0345   PHART  7.324*   PCO2ART  44.3   PO2ART  107.0*   HCO3  22.4   O2SAT  97.5    Ventilator Settings:   CXR: 9/29 right basilar atelectasis, otherwise improving film/aeration  ETT: 9/23>>>9/26>>>9/26>>>9/30  A:  Acute Respiratory Failure -initially in setting of neurological process but now due to upper airway obstruction, patient was a very difficult airway and multiple attempts at intubation in the ED that resulted in significant upper airway trauma. Was extubated on 9/26 but had to be reintubated due to stridor. Upon intubating the patient it was noted that there was significant edema in the upper airway resulting in upper airway compromise. Negative leak test on endotracheal cuff 9/29  Hemoptysis  Likely due to upper airway trauma as well as anti-coagulation. This  has resolved  P:  CIR consult  CARDIOVASCULAR  No results found for this basename: TROPONINI:5,LATICACIDVEN:5, O2SATVEN:5,PROBNP:5 in the last 168 hours  ECG:  Lines: 9/26 right IJ CVL>>>  A:  Hx of Afib - diagnosed in 08/2011 as new onset. He is currently in normal sinus rhythm  Hx of HTN  Hx of HLD  P:  -rate controlled -resume atenolol & cardizem at some point  -resume zocor.  - resume coumadin  RENAL   Lab  12/06/11 0500  12/05/11 0440  12/04/11 0315  12/03/11 0500  12/02/11 0415  12/01/11 0400  11/30/11 1742  11/30/11 0504   NA  145  153*  147*  148*  142  --  --  --   K  3.3*  4.1  --  --  --  --  --  --   CL  108  115*  111  114*  109  --  --  --   CO2  30  31  27  27  24   --  --  --   BUN  27*  33*  39*  35*  33*  --  --  --   CREATININE  1.09  1.03  0.97  1.00  1.20  --  --  --   CALCIUM  8.9  9.7  9.4  9.3  9.1  --  --  --   MG  --  --  --  --  2.5  2.5  2.1  2.2   PHOS  --  --  --  --  2.9  3.7  3.9  3.6    Intake/Output  10/01 0701 - 10/02 0700 10/02 0701 - 10/03 0700  I.V. (mL/kg) 40 (0.4)  NG/GT  IV Piggyback 105  Total Intake(mL/kg) 145 (1.4)  Urine (mL/kg/hr) 230 (0.1)  Total Output 230  Net -85  Urine Occurrence 1 x   No intake or output data in the 24 hours ending 12/06/11 0949  Foley: 9/23  A:  Hypernatremia  hypokalemia  P: Replete K  GASTROINTESTINAL  No results found for this basename: AST:5,ALT:5,ALKPHOS:5,BILITOT:5,PROT:5,ALBUMIN:5 in the last 168 hours  A:  nutrition  P:  -reg diet  -dc PPI.  HEMATOLOGIC   Lab  12/06/11 0500  12/04/11 0753  12/04/11 0315  12/03/11 0500  12/02/11 0855  12/02/11 0500  12/02/11 0415  12/01/11 0400   HGB  11.7*  --  12.2*  11.9*  --  --  12.0*  12.4*   HCT  38.2*  --  39.5  39.1  --  --  38.5*  40.0   PLT  133*  --  126*  126*  --  --  102*  93*   INR  1.09  1.18  --  1.14  1.20  1.20  --  --   APTT  --  --  --  --  --  --  --  --    A:  Airway bleeding - in setting of anticoagulation  P:    -Anticoagulation as discussed above.  INFECTIOUS   Lab  12/06/11 0500  12/04/11 0315  12/03/11 0500  12/02/11 0415  12/01/11 0400   WBC  3.7*  4.6  5.3  7.2  8.2   PROCALCITON  --  <0.10  0.15  --  --    Cultures:  9/23 Bcx2>>>neg9/24 Sputum>>nonpathogenic oropharyngeal-type flora  9/26 Blood: coag neg staph 1/2  9/26 Urine>>>negative  Antibiotics:  vanc 9/27>>>9/28  A: coag negative staph blood culture  This was coag negative staph, one out of 2 samples, doubt that this was a legitimate sample in likely reflects contamination, low pct reassuring  P:  -Off antibiotics  ENDOCRINE   Lab  12/06/11 0730  12/06/11 0420  12/06/11 0030  12/05/11 2001  12/05/11 1626   GLUCAP  86  96  109*  100*  91    A:  Hx of DM - questionable diagnosis as pt is not on any home medications for DM  ?steroid induced  P:  -ICU SSI  -dc Lantus (now that off steroids)       Labs at discharge Lab Results  Component Value Date   CREATININE 1.09 12/06/2011   BUN 27* 12/06/2011   NA 145 12/06/2011   K 3.3* 12/06/2011   CL 108 12/06/2011   CO2 30 12/06/2011   Lab Results  Component Value Date   WBC 3.7* 12/06/2011   HGB 11.7* 12/06/2011   HCT 38.2* 12/06/2011   MCV 84.3 12/06/2011   PLT 133* 12/06/2011   Lab Results  Component Value Date   ALT 9 11/28/2011   AST 19 11/28/2011   ALKPHOS 44 11/28/2011   BILITOT 0.4 11/28/2011   Lab Results  Component Value Date   INR 1.29 12/07/2011   INR 1.09 12/06/2011   INR 1.18 12/04/2011   Current facility-administered medications:0.9 %  sodium chloride infusion, 250 mL, Intravenous, PRN, Jeanella Craze, NP, Last Rate: 20 mL/hr at 12/06/11 0931, 500 mL  at 12/06/11 0931;  acetaminophen (TYLENOL) tablet 650 mg, 650 mg, Oral, Q6H PRN, Oretha Milch, MD, 650 mg at 12/07/11 0841;  antiseptic oral rinse (BIOTENE) solution 15 mL, 15 mL, Mouth Rinse, QID, Alyson Reedy, MD, 15 mL at 12/06/11 1600 chlorhexidine (PERIDEX) 0.12 % solution 15 mL, 15 mL, Mouth Rinse, BID,  Alyson Reedy, MD, 15 mL at 12/05/11 0809;  levETIRAcetam (KEPPRA) tablet 500 mg, 500 mg, Oral, BID, Gala Lewandowsky Belt, PHARMD, 500 mg at 12/06/11 2108;  potassium chloride SA (K-DUR,KLOR-CON) CR tablet 40 mEq, 40 mEq, Oral, Once, Oretha Milch, MD, 40 mEq at 12/06/11 1105;  simvastatin (ZOCOR) tablet 20 mg, 20 mg, Oral, Daily, Oretha Milch, MD, 20 mg at 12/06/11 1104 sodium chloride 0.9 % injection 10-40 mL, 10-40 mL, Intracatheter, PRN, Alyson Reedy, MD, 10 mL at 12/06/11 0830;  terazosin (HYTRIN) capsule 2 mg, 2 mg, Oral, Daily, Oretha Milch, MD, 2 mg at 12/06/11 1104;  warfarin (COUMADIN) tablet 10 mg, 10 mg, Oral, ONCE-1800, Gala Lewandowsky Mignon, PHARMD, 10 mg at 12/06/11 1712;  Warfarin - Pharmacist Dosing Inpatient, , Does not apply, q1800, Alyson Reedy, MD DISCONTD: levETIRAcetam (KEPPRA) tablet 500 mg, 500 mg, Oral, BID, Oretha Milch, MD  Current radiology studies No results found.  Disposition: Home  Discharge Orders    Future Appointments: Provider: Department: Dept Phone: Center:   12/14/2011 10:00 AM Windell Hummingbird [facility] [phone] None   12/14/2011 10:30 AM Benjiman Core, MD [facility] [phone] None        Follow up with Dr. Pearlean Brownie.  Discharged Condition: Fair Vital signs at Discharge. Temp:  [98.3 F (36.8 C)-99.2 F (37.3 C)] 98.4 F (36.9 C) (10/03 0800) Pulse Rate:  [52-77] 77  (10/03 0800) Resp:  [17-18] 18  (10/03 0800) BP: (97-131)/(62-76) 131/69 mmHg (10/03 0800) SpO2:  [95 %-98 %] 97 % (10/03 0800) Weight:  [109.226 kg (240 lb 12.8 oz)-109.907 kg (242 lb 4.8 oz)] 109.226 kg (240 lb 12.8 oz) (10/03 0500)  Special Information or instructions. Follow up with Dr. Pearlean Brownie of neurology. Follow up with Dr. Susette Racer of cardiology. Medically stable to transfer to inpatient rehab 12-08-2011 Rehab decline and discharged home with home PT and RN.   Signed: Brett Canales Minor ACNP Adolph Pollack PCCM Pager [phone] till 3 pm If no answer page  [phone] 12/07/2011, 9:52 AM FU appt with neurology to decide duration of Keppra Home PT  Arhan Mcmanamon V.

## 2011-12-07 NOTE — Progress Notes (Signed)
When I arrived pt's companion was bedside. Pt was sitting up in chair. We visited for a few minutes and talked about his improvements. Pt and visitor were very thankful to God for his health. With permission, I had prayer w/pt.  Marjory Lies Chaplain

## 2011-12-07 NOTE — Progress Notes (Signed)
I await OT assessment so that I can pursue insurance approval for inpt rehab. Ordered at [phone] this am. I will contact department to try to facilitate assessment to pursue admission. Please call me with any questions. [phone]

## 2011-12-08 MED ORDER — WARFARIN SODIUM 10 MG PO TABS
10.0000 mg | ORAL_TABLET | Freq: Once | ORAL | Status: DC
  Filled 2011-12-08: qty 1

## 2011-12-08 MED ORDER — SIMVASTATIN 20 MG PO TABS: 20.0000 mg | ORAL_TABLET | Freq: Every day | ORAL | Status: DC

## 2011-12-08 MED ORDER — LEVETIRACETAM 500 MG PO TABS: 500.0000 mg | ORAL_TABLET | Freq: Two times a day (BID) | ORAL | Status: DC

## 2011-12-08 NOTE — Progress Notes (Signed)
Physical Therapy Treatment Patient Details Name: Terry Robertson MRN: [id number] DOB: 05/26/1928 Today's Date: 12/08/2011 Time: 4098-1191 PT Time Calculation (min): 16 min  PT Assessment / Plan / Recommendation Comments on Treatment Session  pt presents with seizures and VDRF.  pt continues to improve with mobility and eager to D/C.      Follow Up Recommendations  Post acute inpatient rehab    Barriers to Discharge        Equipment Recommendations  None recommended by OT;None recommended by PT    Recommendations for Other Services    Frequency Min 3X/week   Plan Discharge plan remains appropriate;Frequency remains appropriate    Precautions / Restrictions Precautions Precautions: Fall Restrictions Weight Bearing Restrictions: No   Pertinent Vitals/Pain Denies pain.      Mobility  Bed Mobility Bed Mobility: Not assessed Transfers Transfers: Sit to Stand;Stand to Sit Sit to Stand: 4: Min assist;With upper extremity assist;From chair/3-in-1;With armrests Stand to Sit: 4: Min guard;With upper extremity assist;To chair/3-in-1;With armrests Details for Transfer Assistance: cues to get closer to recliner and control descent.   Ambulation/Gait Ambulation/Gait Assistance: 4: Min guard Ambulation Distance (Feet): 200 Feet Assistive device: Rolling walker Ambulation/Gait Assistance Details: pt attempts to improve flexed posture, but difficult for pt to maintain.  cues for positioning within RW, and R hand tends to slide forward.  pt improving distance today with less fatigue.   Gait Pattern: Step-through pattern;Decreased stride length;Trunk flexed Stairs: No Wheelchair Mobility Wheelchair Mobility: No    Exercises     PT Diagnosis:    PT Problem List:   PT Treatment Interventions:     PT Goals Acute Rehab PT Goals Time For Goal Achievement: 12/19/11 PT Goal: Sit to Stand - Progress: Progressing toward goal PT Goal: Stand to Sit - Progress: Progressing toward goal  Visit  Information  Last PT Received On: 12/08/11 Assistance Needed: +1    Subjective Data  Subjective: I'm doing better every day.     Cognition  Overall Cognitive Status: Impaired Area of Impairment: Memory;Problem solving Arousal/Alertness: Awake/alert Orientation Level: Disoriented to;Time (could not say the year) Memory Deficits: decreased STM Problem Solving: slow to process information.  Difficulty following multi step commands.      Balance  Balance Balance Assessed: No  End of Session PT - End of Session Equipment Utilized During Treatment: Gait belt;Oxygen Activity Tolerance: Patient tolerated treatment well Patient left: in chair;with call bell/phone within reach;with family/visitor present Nurse Communication: Mobility status   GP     [name], [place] [phone] 12/08/2011, 9:09 AM

## 2011-12-08 NOTE — Care Management Note (Signed)
    Page 1 of 2   12/08/2011     3:44:42 PM   CARE MANAGEMENT NOTE 12/08/2011  Patient:  Terry Robertson, Terry Robertson   Account Number:  [id number]  Date Initiated:  11/29/2011  Documentation initiated by:  Jacquelynn Cree  Subjective/Objective Assessment:   Admitted with seizure, respiratory failure.     Action/Plan:   PT/OT evals, CIR consult   Anticipated DC Date:  12/08/2011   Anticipated DC Plan:  HOME W HOME HEALTH SERVICES      DC Planning Services  CM consult      Choice offered to / List presented to:  C-1 Patient        HH arranged  HH-1 RN  HH-2 PT      [facility] agency  [facility]   Status of service:  Completed, signed off Medicare Important Message given?   (If response is "NO", the following Medicare IM given date fields will be blank) Date Medicare IM given:   Date Additional Medicare IM given:    Discharge Disposition:  HOME W HOME HEALTH SERVICES  Per UR Regulation:  Reviewed for med. necessity/level of care/duration of stay  If discussed at Long Length of Stay Meetings, dates discussed:   12/07/2011    Comments:  12/08/11- 1500- Donn Pierini RN, BSN [phone] Per Britta Mccreedy with CIR- pt has progressed past CIR and can go home with [facility]- pt to d/c home with [facility] today- orders for HH-RN/PT- spoke with pt and SO-Pallis Baldwin at bedside regarding [facility]- per conversation they have used [facility] in past- and wish to use them again this time for [facility] services- referral called to Hilda Lias with [facility]- services to begin within 24-48 post discharge. No other d/c needs noted. Pt to d/c home with Pallis Baldwin-friend (cell- [phone])  12/07/11- 1600- Donn Pierini RN, BSN [phone] CIR consulted for possible admission- believe pt is a good candidate- awaiting for insurance approval-

## 2011-12-08 NOTE — Progress Notes (Signed)
Pt, D/Ced home with family, instructed on prescriptions and discharge teaching. Pt. Stated understanding.

## 2011-12-08 NOTE — Progress Notes (Signed)
ANTICOAGULATION CONSULT NOTE - Follow Up Consult  Pharmacy Consult for Coumadin Indication: atrial fibrillation  No Known Allergies  Patient Measurements: Height: 5\' 5"  (165.1 cm) Weight: 240 lb 12.8 oz (109.226 kg) IBW/kg (Calculated) : 61.5  Vital Signs: Temp: 99.4 F (37.4 C) (10/04 0800) Temp src: Oral (10/04 0800) BP: 142/63 mmHg (10/04 1024) Pulse Rate: 75  (10/04 0800)  Labs:  Alvira Philips 12/08/11 0611 12/07/11 0426 12/06/11 0500  HGB -- -- 11.7*  HCT -- -- 38.2*  PLT -- -- 133*  APTT -- -- --  LABPROT 16.2* 15.8* 14.0  INR 1.33 1.29 1.09  HEPARINUNFRC -- -- --  CREATININE -- -- 1.09  CKTOTAL -- -- --  CKMB -- -- --  TROPONINI -- -- --   Estimated Creatinine Clearance: 60.6 ml/min (by C-G formula based on Cr of 1.09).  Assessment: 78yo male restarted on chronic coumadin for atrial fibrillation. Patient was transferred from 3100 after being on heparin, admitted on 9/23 with a seizure and concern for new CVA. Patients PTA dose of coumadin is 10mg  on Mondays and 7.5mg  on all other days. INR today is 1.33 (increasing after 3 doses of 10 mg). CBC stable-on 10/2.  No bleeding noted.   Goal of Therapy:  INR 2-3   Plan:  1. Coumadin 10mg  again tonight - Note patient was sub-therapeutic on admission at 1.63. Recommend sending home on 10mg  Monday and Thursday and 7.5mg  all other days with follow-up INR in 3-4 days.  2. Follow-up PT/INR in AM.   Len Childs T 12/08/2011,11:19 AM

## 2011-12-08 NOTE — Progress Notes (Signed)
I await insurance decision on approval for inpt rehab admission. As I spoke with pt and his significant other, Ms Cathlean Cower, yesterday, if pt not approved for inpt rheba today, they are requesting home with [facility]. They do not want SNF. I will let everyone know asap. [phone]

## 2011-12-08 NOTE — Progress Notes (Signed)
Patient doing very well with mobility today. He has progressed to a level that he can go home with home health. I will notify Ms Cathlean Cower. Please call for any questions.[phone]

## 2011-12-14 ENCOUNTER — Other Ambulatory Visit: Payer: Self-pay | Admitting: Lab

## 2011-12-14 ENCOUNTER — Ambulatory Visit: Payer: Self-pay | Admitting: Oncology

## 2012-01-02 ENCOUNTER — Telehealth: Payer: Self-pay | Admitting: Oncology

## 2012-01-02 ENCOUNTER — Other Ambulatory Visit (HOSPITAL_BASED_OUTPATIENT_CLINIC_OR_DEPARTMENT_OTHER): Payer: Medicare Other

## 2012-01-02 ENCOUNTER — Ambulatory Visit (HOSPITAL_BASED_OUTPATIENT_CLINIC_OR_DEPARTMENT_OTHER): Payer: Medicare Other | Admitting: Oncology

## 2012-01-02 VITALS — BP 121/60 | HR 70 | Temp 97.5°F | Resp 20 | Ht 65.0 in | Wt 239.9 lb

## 2012-01-02 DIAGNOSIS — D759 Disease of blood and blood-forming organs, unspecified: Secondary | ICD-10-CM

## 2012-01-02 DIAGNOSIS — D469 Myelodysplastic syndrome, unspecified: Secondary | ICD-10-CM

## 2012-01-02 DIAGNOSIS — D47Z9 Other specified neoplasms of uncertain behavior of lymphoid, hematopoietic and related tissue: Secondary | ICD-10-CM

## 2012-01-02 LAB — COMPREHENSIVE METABOLIC PANEL (CC13)
AST: 14 U/L (ref 5–34)
Albumin: 3.3 g/dL — ABNORMAL LOW (ref 3.5–5.0)
BUN: 12 mg/dL (ref 7.0–26.0)
Calcium: 9.2 mg/dL (ref 8.4–10.4)
Chloride: 109 mEq/L — ABNORMAL HIGH (ref 98–107)
Creatinine: 1 mg/dL (ref 0.7–1.3)
Glucose: 118 mg/dl — ABNORMAL HIGH (ref 70–99)

## 2012-01-02 LAB — CBC WITH DIFFERENTIAL/PLATELET
BASO%: 0.7 % (ref 0.0–2.0)
Basophils Absolute: 0 10*3/uL (ref 0.0–0.1)
EOS%: 0.3 % (ref 0.0–7.0)
HCT: 34.4 % — ABNORMAL LOW (ref 38.4–49.9)
HGB: 11.3 g/dL — ABNORMAL LOW (ref 13.0–17.1)
LYMPH%: 42.5 % (ref 14.0–49.0)
MCH: 26.4 pg — ABNORMAL LOW (ref 27.2–33.4)
MCHC: 32.8 g/dL (ref 32.0–36.0)
NEUT%: 27.7 % — ABNORMAL LOW (ref 39.0–75.0)
Platelets: 84 10*3/uL — ABNORMAL LOW (ref 140–400)

## 2012-01-02 NOTE — Progress Notes (Signed)
Hematology and Oncology Follow Up Visit  Terry Robertson [id number] March 27, 1928 78 y.o. 01/02/2012 4:10 PM    Principle Diagnosis: This is a 78 year old gentleman with myelodysplastic syndrome manifesting predominantly with mild anemia and neutropenia.  Prior Therapy: S/P bone marrow biopsy in 03/2008 to confirm the above diagnosis.   Interim History: Terry Robertson presents today for a follow-up visit.  He is a gentleman who has been following since October 2009, with an isolated neutropenia. Bone marrow biopsy showed an element of both myeloproliferative as well as myelodysplastic syndrome, but overall I felt that the features are consistent with myelodysplastic syndrome.  I have elected to treatment him with supportive care only for the time being.  He has been completely asymptomatic.  He has not reported any recurrent sign of pulmonary infection.  He is not having increased blast.  His cytogenetics are normal.  Since the last time I saw him, he was hospitalized for respiratory and seizures. He is getting better and continue to improve.  He has received one Neulasta injection in the past and has helped his blood count dramatically and it is good to know that he has a good response to it down the line.  Otherwise, Terry Robertson does not report any recurrent sign of pulmonary injection.  He has not reported any peripheral neuropathy.  He has not reported any other complaints.   Medications: I have reviewed the patient's current medications. Current outpatient prescriptions:aspirin EC 81 MG tablet, Take 81 mg by mouth daily., Disp: , Rfl: ;  atenolol (TENORMIN) 12.5 mg TABS, Take 0.5 tablets (12.5 mg total) by mouth daily., Disp: 303 tablet, Rfl: 5;  diltiazem (CARDIZEM CD) 180 MG 24 hr capsule, Take 1 capsule (180 mg total) by mouth daily., Disp: 30 capsule, Rfl: 5 levETIRAcetam (KEPPRA) 500 MG tablet, Take 1 tablet (500 mg total) by mouth 2 (two) times daily., Disp: 60 tablet, Rfl: 1;  lisinopril  (PRINIVIL,ZESTRIL) 10 MG tablet, Take 1 tablet (10 mg total) by mouth daily., Disp: 303 tablet, Rfl: 5;  nitroGLYCERIN (NITROSTAT) 0.4 MG SL tablet, Place 1 tablet (0.4 mg total) under the tongue every 5 (five) minutes x 3 doses as needed for chest pain., Disp: 25 tablet, Rfl: 5 omega-3 acid ethyl esters (LOVAZA) 1 G capsule, Take 2 g by mouth 2 (two) times daily. , Disp: , Rfl: ;  simvastatin (ZOCOR) 20 MG tablet, Take 1 tablet (20 mg total) by mouth daily., Disp: 30 tablet, Rfl: 0;  terazosin (HYTRIN) 2 MG capsule, Take 2 mg by mouth daily., Disp: , Rfl: ;  Vitamin D, Ergocalciferol, (DRISDOL) 50000 UNITS CAPS, Take 500 Units by mouth., Disp: , Rfl:  warfarin (COUMADIN) 5 MG tablet, Take 5 mg by mouth daily. 10 mg (2 tabs) on Monday, 7.5 mg (1 1/2 tabs) on all other days of the week, Disp: , Rfl: ;  DISCONTD: simvastatin (ZOCOR) 20 MG tablet, Take 40 mg by mouth daily. , Disp: , Rfl:   Allergies: No Known Allergies  Past Medical History, Surgical history, Social history, and Family History were reviewed and updated.  Review of Systems: Constitutional:  Negative for fever, chills, night sweats, anorexia, weight loss, pain. Cardiovascular: no chest pain or dyspnea on exertion Respiratory: no cough, shortness of breath, or wheezing Neurological: no TIA or stroke symptoms Dermatological: negative ENT: negative Skin: Negative. Gastrointestinal: no abdominal pain, change in bowel habits, or black or bloody stools Genito-Urinary: negative Hematological and Lymphatic: negative Breast: negative Musculoskeletal: negative Remaining ROS negative. Physical Exam: Blood pressure  121/60, pulse 70, temperature 97.5 F (36.4 C), temperature source Oral, resp. rate 20, height 5\' 5"  (1.651 m), weight 239 lb 14.4 oz (108.818 kg). ECOG: 1 General appearance: alert Head: Normocephalic, without obvious abnormality, atraumatic Neck: no adenopathy, no carotid bruit, no JVD, supple, symmetrical, trachea midline  and thyroid not enlarged, symmetric, no tenderness/mass/nodules Lymph nodes: Cervical, supraclavicular, and axillary nodes normal. Heart:regular rate and rhythm, S1, S2 normal, no murmur, click, rub or gallop Lung:chest clear, no wheezing, rales, normal symmetric air entry Abdomin: soft, non-tender, without masses or organomegaly EXT:no erythema, induration, or nodules   Lab Results: Lab Results  Component Value Date   WBC 2.3* 01/02/2012   HGB 11.3* 01/02/2012   HCT 34.4* 01/02/2012   MCV 80.6 01/02/2012   PLT 84* 01/02/2012     Chemistry      Component Value Date/Time   NA 139 01/02/2012 1526   NA 145 12/06/2011 0500   K 3.8 01/02/2012 1526   K 3.3* 12/06/2011 0500   CL 109* 01/02/2012 1526   CL 108 12/06/2011 0500   CO2 26 01/02/2012 1526   CO2 30 12/06/2011 0500   BUN 12.0 01/02/2012 1526   BUN 27* 12/06/2011 0500   CREATININE 1.0 01/02/2012 1526   CREATININE 1.09 12/06/2011 0500      Component Value Date/Time   CALCIUM 9.2 01/02/2012 1526   CALCIUM 8.9 12/06/2011 0500   ALKPHOS 52 01/02/2012 1526   ALKPHOS 44 11/28/2011 1440   AST 14 01/02/2012 1526   AST 19 11/28/2011 1440   ALT 8 01/02/2012 1526   ALT 9 11/28/2011 1440   BILITOT 0.40 01/02/2012 1526   BILITOT 0.4 11/28/2011 1440      Impression and Plan:  This is a 78 year old gentleman with following the issues: 1. Myelodysplastic/myeloproliferative disorder, JAK2 mutation is negative.  His cytogenetics normal, but for the time being it is more likely myelodysplastic syndrome more than anything else.  We will continue supportive measures at this time. He does not need any growth factor support unless he has a symptomatology such as recurrent infection etc.  We will continue to follow him every 6 months. 2.         Cytopenias: No need for any transfusions or growth factor support at this time.    Olevia Westervelt, MD 10/29/20134:10 PM

## 2012-01-02 NOTE — Telephone Encounter (Signed)
appts made and printed for pt aom °

## 2012-03-16 ENCOUNTER — Other Ambulatory Visit: Payer: Self-pay

## 2012-03-16 ENCOUNTER — Inpatient Hospital Stay (HOSPITAL_COMMUNITY)
Admission: EM | Admit: 2012-03-16 | Discharge: 2012-03-27 | DRG: 100 | Disposition: A | Discharge status: Rehabilitation Facility | Payer: Medicare Other | Attending: Internal Medicine | Admitting: Internal Medicine

## 2012-03-16 ENCOUNTER — Inpatient Hospital Stay (HOSPITAL_COMMUNITY): Payer: Medicare Other

## 2012-03-16 ENCOUNTER — Encounter (HOSPITAL_COMMUNITY): Payer: Self-pay

## 2012-03-16 ENCOUNTER — Emergency Department (HOSPITAL_COMMUNITY): Payer: Medicare Other

## 2012-03-16 DIAGNOSIS — D72819 Decreased white blood cell count, unspecified: Secondary | ICD-10-CM | POA: Diagnosis present

## 2012-03-16 DIAGNOSIS — Z7901 Long term (current) use of anticoagulants: Secondary | ICD-10-CM

## 2012-03-16 DIAGNOSIS — Z7982 Long term (current) use of aspirin: Secondary | ICD-10-CM

## 2012-03-16 DIAGNOSIS — I69998 Other sequelae following unspecified cerebrovascular disease: Secondary | ICD-10-CM

## 2012-03-16 DIAGNOSIS — R4182 Altered mental status, unspecified: Secondary | ICD-10-CM

## 2012-03-16 DIAGNOSIS — D696 Thrombocytopenia, unspecified: Secondary | ICD-10-CM | POA: Diagnosis present

## 2012-03-16 DIAGNOSIS — J811 Chronic pulmonary edema: Secondary | ICD-10-CM | POA: Diagnosis present

## 2012-03-16 DIAGNOSIS — R569 Unspecified convulsions: Secondary | ICD-10-CM | POA: Diagnosis present

## 2012-03-16 DIAGNOSIS — R509 Fever, unspecified: Secondary | ICD-10-CM | POA: Diagnosis not present

## 2012-03-16 DIAGNOSIS — D469 Myelodysplastic syndrome, unspecified: Secondary | ICD-10-CM | POA: Diagnosis present

## 2012-03-16 DIAGNOSIS — E785 Hyperlipidemia, unspecified: Secondary | ICD-10-CM

## 2012-03-16 DIAGNOSIS — G40401 Other generalized epilepsy and epileptic syndromes, not intractable, with status epilepticus: Principal | ICD-10-CM | POA: Diagnosis present

## 2012-03-16 DIAGNOSIS — E119 Type 2 diabetes mellitus without complications: Secondary | ICD-10-CM

## 2012-03-16 DIAGNOSIS — I959 Hypotension, unspecified: Secondary | ICD-10-CM

## 2012-03-16 DIAGNOSIS — D61818 Other pancytopenia: Secondary | ICD-10-CM | POA: Diagnosis present

## 2012-03-16 DIAGNOSIS — I5189 Other ill-defined heart diseases: Secondary | ICD-10-CM

## 2012-03-16 DIAGNOSIS — Z9861 Coronary angioplasty status: Secondary | ICD-10-CM

## 2012-03-16 DIAGNOSIS — I4891 Unspecified atrial fibrillation: Secondary | ICD-10-CM

## 2012-03-16 DIAGNOSIS — I69920 Aphasia following unspecified cerebrovascular disease: Secondary | ICD-10-CM

## 2012-03-16 DIAGNOSIS — Z87891 Personal history of nicotine dependence: Secondary | ICD-10-CM

## 2012-03-16 DIAGNOSIS — I519 Heart disease, unspecified: Secondary | ICD-10-CM | POA: Diagnosis present

## 2012-03-16 DIAGNOSIS — C9000 Multiple myeloma not having achieved remission: Secondary | ICD-10-CM | POA: Diagnosis present

## 2012-03-16 DIAGNOSIS — I10 Essential (primary) hypertension: Secondary | ICD-10-CM | POA: Diagnosis present

## 2012-03-16 DIAGNOSIS — A498 Other bacterial infections of unspecified site: Secondary | ICD-10-CM | POA: Diagnosis present

## 2012-03-16 DIAGNOSIS — R1310 Dysphagia, unspecified: Secondary | ICD-10-CM | POA: Diagnosis present

## 2012-03-16 DIAGNOSIS — N390 Urinary tract infection, site not specified: Secondary | ICD-10-CM | POA: Diagnosis present

## 2012-03-16 DIAGNOSIS — R5381 Other malaise: Secondary | ICD-10-CM | POA: Diagnosis present

## 2012-03-16 DIAGNOSIS — I639 Cerebral infarction, unspecified: Secondary | ICD-10-CM

## 2012-03-16 DIAGNOSIS — J9600 Acute respiratory failure, unspecified whether with hypoxia or hypercapnia: Secondary | ICD-10-CM | POA: Diagnosis present

## 2012-03-16 DIAGNOSIS — I2510 Atherosclerotic heart disease of native coronary artery without angina pectoris: Secondary | ICD-10-CM | POA: Diagnosis present

## 2012-03-16 DIAGNOSIS — G9340 Encephalopathy, unspecified: Secondary | ICD-10-CM | POA: Diagnosis present

## 2012-03-16 DIAGNOSIS — E876 Hypokalemia: Secondary | ICD-10-CM | POA: Diagnosis present

## 2012-03-16 HISTORY — DX: Dependence on other enabling machines and devices: Z99.89

## 2012-03-16 HISTORY — DX: Obstructive sleep apnea (adult) (pediatric): G47.33

## 2012-03-16 HISTORY — DX: Unspecified convulsions: R56.9

## 2012-03-16 LAB — COMPREHENSIVE METABOLIC PANEL
Alkaline Phosphatase: 51 U/L (ref 39–117)
BUN: 9 mg/dL (ref 6–23)
Creatinine, Ser: 0.97 mg/dL (ref 0.50–1.35)
GFR calc Af Amer: 87 mL/min — ABNORMAL LOW (ref >90)
Glucose, Bld: 110 mg/dL — ABNORMAL HIGH (ref 70–99)
Potassium: 3.6 mEq/L (ref 3.5–5.1)
Total Protein: 9.7 g/dL — ABNORMAL HIGH (ref 6.0–8.3)

## 2012-03-16 LAB — DIFFERENTIAL
Basophils Relative: 0 % (ref 0–1)
Eosinophils Absolute: 0 10*3/uL (ref 0.0–0.7)
Eosinophils Relative: 0 % (ref 0–5)
Lymphs Abs: 1.6 10*3/uL (ref 0.7–4.0)
Monocytes Absolute: 1.1 10*3/uL — ABNORMAL HIGH (ref 0.1–1.0)
Monocytes Relative: 34 % — ABNORMAL HIGH (ref 3–12)

## 2012-03-16 LAB — CBC
HCT: 39.7 % (ref 39.0–52.0)
Hemoglobin: 12.5 g/dL — ABNORMAL LOW (ref 13.0–17.0)
MCH: 26.7 pg (ref 26.0–34.0)
MCHC: 31.5 g/dL (ref 30.0–36.0)
MCV: 84.6 fL (ref 78.0–100.0)
RBC: 4.69 MIL/uL (ref 4.22–5.81)

## 2012-03-16 LAB — POCT I-STAT, CHEM 8
Calcium, Ion: 1.21 mmol/L (ref 1.13–1.30)
Chloride: 106 mEq/L (ref 96–112)
HCT: 40 % (ref 39.0–52.0)
Sodium: 144 mEq/L (ref 135–145)
TCO2: 27 mmol/L (ref 0–100)

## 2012-03-16 LAB — URINALYSIS, ROUTINE W REFLEX MICROSCOPIC
Bilirubin Urine: NEGATIVE
Specific Gravity, Urine: 1.013 (ref 1.005–1.030)
pH: 6 (ref 5.0–8.0)

## 2012-03-16 LAB — GLUCOSE, CAPILLARY: Glucose-Capillary: 101 mg/dL — ABNORMAL HIGH (ref 70–99)

## 2012-03-16 LAB — LACTIC ACID, PLASMA: Lactic Acid, Venous: 0.9 mmol/L (ref 0.5–2.2)

## 2012-03-16 LAB — URINE MICROSCOPIC-ADD ON

## 2012-03-16 LAB — TROPONIN I: Troponin I: <0.3 ng/mL (ref <0.30)

## 2012-03-16 LAB — MRSA PCR SCREENING: MRSA by PCR: NEGATIVE

## 2012-03-16 MED ORDER — LORAZEPAM 2 MG/ML IJ SOLN
2.0000 mg | Freq: Once | INTRAMUSCULAR | Status: AC
  Administered 2012-03-16: 2 mg via INTRAVENOUS

## 2012-03-16 MED ORDER — SODIUM CHLORIDE 0.9 % IV SOLN
1000.0000 mg | Freq: Two times a day (BID) | INTRAVENOUS | Status: DC
  Administered 2012-03-17 – 2012-03-24 (×15): 1000 mg via INTRAVENOUS
  Filled 2012-03-16 (×19): qty 10

## 2012-03-16 MED ORDER — SODIUM CHLORIDE 0.9 % IV SOLN
1000.0000 mg | Freq: Once | INTRAVENOUS | Status: AC
  Administered 2012-03-16: 1000 mg via INTRAVENOUS
  Filled 2012-03-16: qty 10

## 2012-03-16 MED ORDER — SODIUM CHLORIDE 0.9 % IV SOLN
INTRAVENOUS | Status: AC
  Administered 2012-03-16: 100 mL/h via INTRAVENOUS

## 2012-03-16 MED ORDER — LORAZEPAM 2 MG/ML IJ SOLN
INTRAMUSCULAR | Status: AC
  Administered 2012-03-16: 1 mg via INTRAVENOUS
  Filled 2012-03-16: qty 1

## 2012-03-16 MED ORDER — SODIUM CHLORIDE 0.9 % IV SOLN: 250.0000 mL | INTRAVENOUS | Status: DC | PRN

## 2012-03-16 MED ORDER — LORAZEPAM 2 MG/ML IJ SOLN
1.0000 mg | INTRAMUSCULAR | Status: DC | PRN
  Administered 2012-03-16: 1 mg via INTRAVENOUS
  Filled 2012-03-16: qty 1

## 2012-03-16 MED ORDER — ALBUTEROL SULFATE (5 MG/ML) 0.5% IN NEBU: 2.5000 mg | INHALATION_SOLUTION | RESPIRATORY_TRACT | Status: AC | PRN

## 2012-03-16 MED ORDER — LORAZEPAM 2 MG/ML IJ SOLN
INTRAMUSCULAR | Status: AC
  Filled 2012-03-16: qty 1

## 2012-03-16 MED ORDER — HYDRALAZINE HCL 20 MG/ML IJ SOLN
10.0000 mg | Freq: Four times a day (QID) | INTRAMUSCULAR | Status: DC | PRN
  Administered 2012-03-16: 10 mg via INTRAVENOUS
  Filled 2012-03-16: qty 1

## 2012-03-16 NOTE — Code Documentation (Addendum)
Code Stroke called at 1717 Patient arrival 73 EDP exam 1722 Stroke Team arrival 1725 Last Seen Normal 1400 Pt arrival in CT 1727 Phlebotomist arrival 1728  Patient with history of seizures on Keppra.  Patient with Right sided weakness and one word speech and right gaze upon arrival.  Head CT done, Dr Roseanne Reno at bedside.  Patient with seizure activity noted post head CT (Right leg movement and eye fluttering, Right gaze).  Upon return to ED O2 sats 80s on 6L/[place] O2 increased to NRB  O2 sats 100%.  Code Stroke canceled.

## 2012-03-16 NOTE — ED Notes (Signed)
Pt remains unable to respond at this time. Vitals signs stable. Family informed and given update on situation. Family at bedside.

## 2012-03-16 NOTE — ED Notes (Signed)
CBG was 101. Notified Nurse Marylene Land.

## 2012-03-16 NOTE — ED Notes (Signed)
16 Temp [place] Foley

## 2012-03-16 NOTE — ED Notes (Signed)
Dr. Betti Cruz has paged Critical Care.  Per MD, pt needs ICU bed.

## 2012-03-16 NOTE — Consult Note (Signed)
NEURO HOSPITALIST CONSULT NOTE    Reason for Consult: Recurrent seizure activity.  HPI:                                                                                                                                          Terry Robertson is an 79 y.o. male with a history of previous left parietal stroke, seizure disorder, hypertension, diabetes mellitus and coronary artery disease, presenting with speech difficulty and reduced movement of his right extremities. Patient arrived in code stroke status. He was noted to have markedly reduced speech output as well as eyes deviated to the right side. He was also noted not to be moving his right side, as well. CT scan of his head showed old left parietal stroke. There were no acute findings. While in radiology the patient had a witnessed focal motor seizure involving his right arm right leg and right side of his face. Eyes were deviated tonically to the right side as well. He was given with 2 mg of Ativan. He's been taking Keppra 500 mg twice a day. A loading dose of Keppra 1 g was ordered to be given IV. Patient's seizure activity stopped following administration of initial dose of Ativan. He remained confused, however. Code stroke was canceled.  Past Medical History  Diagnosis Date  . Coronary artery disease   . Diabetes mellitus   . Hypertension   . Stroke   . Seizures     History reviewed. No pertinent past surgical history.  No family history on file.   Social History:  reports that he quit smoking about 24 years ago. His smoking use included Cigarettes. He does not have any smokeless tobacco history on file. He reports that he does not drink alcohol or use illicit drugs.  No Known Allergies  MEDICATIONS:                                                                                                                     Prior to Admission:  Aspirin 81 mg per day Tenormin 12.5 mg per day Cardizem CD 180 mg per  day Keppra 500 mg twice a day Lisinopril 10 mg per day Nitroglycerin 0.4 mg when necessary Omega-3 fatty acid 2 g twice a day Zocor 20 mg per day  Hytrin 2 mg per day Vitamin C 500 units per day Coumadin 10 mg on Mondays and 7.5 mg on all other days   Blood pressure 187/80, pulse 90, temperature 100.4 F (38 C), temperature source Rectal, resp. rate 18, SpO2 100.00%.  Neurologic Examination:                                                                                                      Mental Status: Patient was confused with gaze deviation to the right side. An occasional single syllable responses and tended to perseverate. He was able to follow some commands with use of his left extremities. Cranial Nerves: II-Visual fields intact bilaterally to visual threat. III/IV/VI-Pupils were equal and reacted. Extraocular movements were limited to midline and pulled the right side.    V/VII-no facial weakness. X-occasional single syllable responses which were somewhat dysarthric. XII-midline tongue extension Motor: No voluntary movements of right extremities. Muscle tone was flaccid when seizure activity stopped. Normal strength of left upper and lower extremities. Sensory: Unable to adequately assess due to confusion Deep Tendon Reflexes: 1+ and symmetric. Plantars: Flexor on the left than mute on the right Cerebellar: Unable to adequately test due to confusion  Lab Results  Component Value Date/Time   CHOL 100 08/10/2011  5:18 AM    Results for orders placed during the hospital encounter of 03/16/12 (from the past 48 hour(s))  POCT I-STAT, CHEM 8     Status: Abnormal   Collection Time   03/16/12  5:39 PM      Component Value Range Comment   Sodium 144  135 - 145 mEq/L    Potassium 3.7  3.5 - 5.1 mEq/L    Chloride 106  96 - 112 mEq/L    BUN 9  6 - 23 mg/dL    Creatinine, Ser 1.61  0.50 - 1.35 mg/dL    Glucose, Bld 096 (*) 70 - 99 mg/dL    Calcium, Ion 0.45  4.09 - 1.30 mmol/L     TCO2 27  0 - 100 mmol/L    Hemoglobin 13.6  13.0 - 17.0 g/dL    HCT 81.1  91.4 - 78.2 %   PROTIME-INR     Status: Abnormal   Collection Time   03/16/12  5:45 PM      Component Value Range Comment   Prothrombin Time 23.9 (*) 11.6 - 15.2 seconds    INR 2.25 (*) 0.00 - 1.49   APTT     Status: Abnormal   Collection Time   03/16/12  5:45 PM      Component Value Range Comment   aPTT 47 (*) 24 - 37 seconds   CBC     Status: Abnormal   Collection Time   03/16/12  5:45 PM      Component Value Range Comment   WBC 3.3 (*) 4.0 - 10.5 K/uL    RBC 4.69  4.22 - 5.81 MIL/uL    Hemoglobin 12.5 (*) 13.0 - 17.0 g/dL    HCT 95.6  21.3 - 08.6 %    MCV 84.6  78.0 - 100.0 fL    MCH 26.7  26.0 - 34.0 pg    MCHC 31.5  30.0 - 36.0 g/dL    RDW 19.1  47.8 - 29.5 %    Platelets 79 (*) 150 - 400 K/uL PLATELET COUNT CONFIRMED BY SMEAR  DIFFERENTIAL     Status: Abnormal   Collection Time   03/16/12  5:45 PM      Component Value Range Comment   Neutrophils Relative 16 (*) 43 - 77 %    Neutro Abs 0.5 (*) 1.7 - 7.7 K/uL    Lymphocytes Relative 50 (*) 12 - 46 %    Lymphs Abs 1.6  0.7 - 4.0 K/uL    Monocytes Relative 34 (*) 3 - 12 %    Monocytes Absolute 1.1 (*) 0.1 - 1.0 K/uL    Eosinophils Relative 0  0 - 5 %    Eosinophils Absolute 0.0  0.0 - 0.7 K/uL    Basophils Relative 0  0 - 1 %    Basophils Absolute 0.0  0.0 - 0.1 K/uL   GLUCOSE, CAPILLARY     Status: Abnormal   Collection Time   03/16/12  5:54 PM      Component Value Range Comment   Glucose-Capillary 101 (*) 70 - 99 mg/dL    Comment 1 Documented in Chart      Comment 2 Notify RN       Ct Head Wo Contrast  03/16/2012  *RADIOLOGY REPORT*  Clinical Data: Code stroke.  Aphasia and confusion.  CT HEAD WITHOUT CONTRAST  Technique:  Contiguous axial images were obtained from the base of the skull through the vertex without contrast.  Comparison: 11/29/2011  Findings: Encephalomalacia within the left parietal lobe is similar to previous MRI.  There is  prominence of the sulci and ventricles consistent with brain atrophy.  Chronic right basal ganglia lacunar infarcts are again identified.  There is no evidence for acute brain infarct, hemorrhage or mass.  There is evidence of old trauma to the right orbit.  Mild mucosal thickening is identified involving the right maxillary sinus.  The skull is intact.  IMPRESSION:  1.  No acute intracranial abnormalities. 2.  Remote left parietal lobe infarct.   Original Report Authenticated By: Signa Kell, M.D.    Assessment/Plan: Recurrent, breakthrough focal seizure activity with focal motor activity as well as altered mental status with confusion. Likely sources previous left cerebral infarction. Recurrent stroke will need to be ruled out however.  Recommendations: 1. Increase daily Keppra dose to 1000 mg twice a day. 2. MRI of the brain without contrast to rule out recurrent acute stroke. 3. Ativan 2 mg IV when necessary recurrent seizure activity.   Venetia Maxon M.D. [facility] [phone]  03/16/2012, 6:20 PM

## 2012-03-16 NOTE — H&P (Signed)
Name: Terry Robertson MRN: [id number] DOB: 1928-06-28    LOS: 0  Referring Provider:  Dr. Betti Robertson  Reason for Referral:  Seizure with concern for status epilepticus  PULMONARY / CRITICAL CARE MEDICINE  HPI:  [name] is an 80 y/o male with past medical history of prior CVA, known seizure d/o on keppra, a-fib, HTN and CAD who presented to the [facility] ED via EMS after developing aphasia and right sided weakness around 2pm.  He was evaluated by neurology for code stroke but during his head CT had tonic/clonic seizure activity in his right arm and leg with rightward eye deviation.  The code stroke was cancelled and he was given 2mg  ativan with resolution of seizure activity.  He has had a head CT which showed no acute infarct or bleed and is currently getting a brain MRI.  He was evaluated by [facility] for admission but there was concern for ongoing seizure activity and PCCM was consulted.   In speaking with a friend who is here with him she reports that he has had runny nose and congestion for the last few days.  No known fevers.   The sequelae of his previous stroke were right sided weekness and slowed speech/aphasia.  His friend reports that these do seem to come back out again if he gets stressed or frustrated.   Past Medical History  Diagnosis Date  . Coronary artery disease   . Diabetes mellitus   . Hypertension   . Stroke   . Seizures    History reviewed. No pertinent past surgical history. Prior to Admission medications   Medication Sig Start Date End Date Taking? Authorizing Provider  aspirin EC 81 MG tablet Take 81 mg by mouth daily.    Historical Provider, MD  atenolol (TENORMIN) 12.5 mg TABS Take 0.5 tablets (12.5 mg total) by mouth daily. 08/14/11   Wilburt Finlay, PA  diltiazem (CARDIZEM CD) 180 MG 24 hr capsule Take 1 capsule (180 mg total) by mouth daily. 08/14/11 08/13/12  Wilburt Finlay, PA  levETIRAcetam (KEPPRA) 500 MG tablet Take 1 tablet (500 mg total) by mouth 2 (two)  times daily. 12/08/11   Vilinda Blanks Minor, NP  lisinopril (PRINIVIL,ZESTRIL) 10 MG tablet Take 1 tablet (10 mg total) by mouth daily. 08/14/11 08/13/12  Wilburt Finlay, PA  nitroGLYCERIN (NITROSTAT) 0.4 MG SL tablet Place 1 tablet (0.4 mg total) under the tongue every 5 (five) minutes x 3 doses as needed for chest pain. 08/14/11 08/13/12  Wilburt Finlay, PA  omega-3 acid ethyl esters (LOVAZA) 1 G capsule Take 2 g by mouth 2 (two) times daily.     Historical Provider, MD  simvastatin (ZOCOR) 20 MG tablet Take 1 tablet (20 mg total) by mouth daily. 12/08/11   Vilinda Blanks Minor, NP  terazosin (HYTRIN) 2 MG capsule Take 2 mg by mouth daily.    Historical Provider, MD  Vitamin D, Ergocalciferol, (DRISDOL) 50000 UNITS CAPS Take 500 Units by mouth.    Historical Provider, MD  warfarin (COUMADIN) 5 MG tablet Take 5 mg by mouth daily. 10 mg (2 tabs) on Monday, 7.5 mg (1 1/2 tabs) on all other days of the week    Historical Provider, MD   Allergies No Known Allergies  Family History No family history on file. Social History  reports that he quit smoking about 24 years ago. His smoking use included Cigarettes. He does not have any smokeless tobacco history on file. He reports that he does not drink alcohol or  use illicit drugs.  Review Of Systems:  Could not be obtained due to altered mental status  Brief patient description:  79 y/o pt with prior cva, afib and seizure d/o who presented with seizure  Events Since Admission: none  Current Status:  Vital Signs: Temp:  [98.2 F (36.8 C)-100.4 F (38 C)] 98.2 F (36.8 C) (01/11 1900) Pulse Rate:  [90-114] 114  (01/11 1900) Resp:  [14-23] 14  (01/11 1900) BP: (186-193)/(80-89) 186/89 mmHg (01/11 1900) SpO2:  [77 %-100 %] 100 % (01/11 1900)  Physical Examination: General:  Obese male, appears younger than stated age, responsive and able to answer yes or no questions  Neuro:  PERRL, EOMI, no eye deviation, right ptosis HEENT:  OP clear Neck:   obese Cardiovascular:  NRRR, no mrg Lungs:  CTAB, no wrr, initially sonourous but improved with raising the head of the bed Abdomen:  Obese, NTND, +BS, no HSM Musculoskeletal:  Moving all 4 extremities spontaneously, able to squeeze my hand on the left, not on right,  Skin:  No lesions  Principal Problem:  *Status epilepticus, generalized convulsive Active Problems:  CAD (coronary artery disease) PCI to circumflex in 1997.  100% occluded RCA  HTN (hypertension)  DM type 2 (diabetes mellitus, type 2)  Atrial fibrillation  Acute respiratory failure  Altered mental status   ASSESSMENT AND PLAN  PULMONARY No results found for this basename: PHART:5,PCO2:5,PCO2ART:5,PO2ART:5,HCO3:5,O2SAT:5 in the last 168 hours Ventilator Settings:   CXR:  Lower lung volumes. Increased interstitial and patchy bibasilar  opacity could reflect interstitial edema and atelectasis versus  bilateral respiratory infection.  ETT:  Not iintubated  A:  O2 saturations recorded as 77% on admission, currently 100% on non-rebreather P:   Titrate FiO2 to SpO2 >92% Monitor respiratory status closely   CARDIOVASCULAR  Lab 03/16/12 1844 03/16/12 1745  TROPONINI -- <0.30  LATICACIDVEN 0.9 --  PROBNP -- --   ECG:  A-fib Lines: PIV  A: atrial fibrillation on multiple rate control agents at home, HTN P:  On atenolol and diltiazem at home for rate control and HTN Holding PO meds for now due to aspiration risk, may require NG tube for drug administration or IV agents for rate control.  RENAL  Lab 03/16/12 1745 03/16/12 1739  NA 138 144  K 3.6 3.7  CL 102 106  CO2 26 --  BUN 9 9  CREATININE 0.97 1.00  CALCIUM 9.3 --  MG -- --  PHOS -- --   Intake/Output    None    Foley:  28F placed 1/11  A:  No current problems P:   Strict Is and Os Daily BMP  GASTROINTESTINAL  Lab 03/16/12 1745  AST 24  ALT 14  ALKPHOS 51  BILITOT 0.3  PROT 9.7*  ALBUMIN 3.5    A:  Risk of aspiration P:    NPO until speech eval can be obtained  HEMATOLOGIC  Lab 03/16/12 1745 03/16/12 1739  HGB 12.5* 13.6  HCT 39.7 40.0  PLT 79* --  INR 2.25* --  APTT 47* --   A:  History of multiple myeloma with leukopenia and thrombocytopenia, on coumadin for afib P:  INR 2.5 - hold coumadin for now, start lovenox or heparin when INR less than 2  INFECTIOUS  Lab 03/16/12 1745  WBC 3.3*  PROCALCITON --   Cultures: Blood, urine Antibiotics: No antibiotics at this time  A:  Low threshold to start antibiotics with leukopenia from multiple myeloma P:   Obtain rapid  flu swab Blood and urine cx obtained on admission, will follow   NEUROLOGIC  A:  Seizure activity, rule out stroke P:   Seen and evaluated by neurology, was not thought to be in status at that time Loaded with 1g keppra, dose increased to 1g BID 2mg  ativan prn seizure activity lasting longer than 5 min or more than 3 seizures in a period   BEST PRACTICE / DISPOSITION Level of Care:  ICU Primary Service:  PCCM Consultants:  neurology Code Status:  full Diet:  full DVT Px:  SCDs GI Px:  None indicatied Skin Integrity:  good Social / Family:  At bedside, lives with son  I spent 40 minutes of critical care time in the care of this patient separate from procedures which are documented elsewhere.    I certify that in my opinion this patients condition warrant inpatient admission and will require a stay the crosses two midnights.   Carolan Clines., M.D. Pulmonary and Critical Care Medicine [facility] Pager: [phone]  03/16/2012, 8:11 PM

## 2012-03-16 NOTE — ED Notes (Signed)
Pt LSN 1400.  Per EMS pt with R sided weakness, R gaze, disoriented and confused.

## 2012-03-16 NOTE — ED Notes (Signed)
Patient has returned from MRI. Nuc Med informed RN that patient demonstrated some mild seizure like activity on the way back from MRI.

## 2012-03-16 NOTE — ED Provider Notes (Signed)
History     CSN: [id number]  Arrival date & time 03/16/12  1721   First MD Initiated Contact with Patient 03/16/12 1727      Chief Complaint  Patient presents with  . Code Stroke     HPI Patient brought in by EMS with dominant right-sided gaze and disorientation.  According to EMS personnel the patient's family saw him normal at 2 PM. A code stroke was called and patient was attended to by Dr. Roseanne Reno.  CT scan showed no acute findings in Dr. Roseanne Reno felt patient was having seizure activity which she's had in the past.  Dr. Roseanne Reno was familiar with patient. Past Medical History  Diagnosis Date  . Coronary artery disease   . Diabetes mellitus   . Hypertension   . Stroke   . Seizures     History reviewed. No pertinent past surgical history.  No family history on file.  History  Substance Use Topics  . Smoking status: Former Smoker    Types: Cigarettes    Quit date: 03/08/1988  . Smokeless tobacco: Not on file  . Alcohol Use: No      Review of Systems  Unable to perform ROS: Mental status change    Allergies  Review of patient's allergies indicates no known allergies.  Home Medications   Current Outpatient Rx  Name  Route  Sig  Dispense  Refill  . ASPIRIN EC 81 MG PO TBEC   Oral   Take 81 mg by mouth daily.         . ATENOLOL 12.5 MG HALF TABLET   Oral   Take 0.5 tablets (12.5 mg total) by mouth daily.   303 tablet   5   . DILTIAZEM HCL ER COATED BEADS 180 MG PO CP24   Oral   Take 1 capsule (180 mg total) by mouth daily.   30 capsule   5   . LEVETIRACETAM 500 MG PO TABS   Oral   Take 1 tablet (500 mg total) by mouth 2 (two) times daily.   60 tablet   1   . LISINOPRIL 10 MG PO TABS   Oral   Take 1 tablet (10 mg total) by mouth daily.   303 tablet   5   . NITROGLYCERIN 0.4 MG SL SUBL   Sublingual   Place 1 tablet (0.4 mg total) under the tongue every 5 (five) minutes x 3 doses as needed for chest pain.   25 tablet   5   . OMEGA-3-ACID  ETHYL ESTERS 1 G PO CAPS   Oral   Take 2 g by mouth 2 (two) times daily.          Marland Kitchen SIMVASTATIN 20 MG PO TABS   Oral   Take 1 tablet (20 mg total) by mouth daily.   30 tablet   0   . TERAZOSIN HCL 2 MG PO CAPS   Oral   Take 2 mg by mouth daily.         Marland Kitchen VITAMIN D (ERGOCALCIFEROL) 50000 UNITS PO CAPS   Oral   Take 500 Units by mouth.         . WARFARIN SODIUM 5 MG PO TABS   Oral   Take 5 mg by mouth daily. 10 mg (2 tabs) on Monday, 7.5 mg (1 1/2 tabs) on all other days of the week           BP 187/80  Pulse 90  Temp 100.4 F (38 C) (  Rectal)  Resp 18  SpO2 100%  Physical Exam  Nursing note and vitals reviewed. Constitutional: He appears well-developed and well-nourished. No distress.  HENT:  Head: Normocephalic and atraumatic.  Eyes: Pupils are equal, round, and reactive to light.  Neck: Normal range of motion.  Cardiovascular: Normal rate and intact distal pulses.   Pulmonary/Chest: No respiratory distress.  Abdominal: Normal appearance. He exhibits no distension. There is no tenderness.  Musculoskeletal: Normal range of motion.  Neurological: No cranial nerve deficit. GCS eye subscore is 4. GCS verbal subscore is 2. GCS motor subscore is 5.  Skin: Skin is warm and dry. No rash noted.  Psychiatric: He has a normal mood and affect. His behavior is normal.    ED Course  Procedures (including critical care time)  Medications  0.9 %  sodium chloride infusion (not administered)  albuterol (PROVENTIL) (5 MG/ML) 0.5% nebulizer solution 2.5 mg (not administered)  LORazepam (ATIVAN) injection 2 mg (2 mg Intravenous Given 03/16/12 1746)  levETIRAcetam (KEPPRA) 1,000 mg in sodium chloride 0.9 % 100 mL IVPB (1000 mg Intravenous New Bag/Given 03/16/12 1802)   CRITICAL CARE Performed by: Nelva Nay L   Total critical care time: 30 min  Critical care time was exclusive of separately billable procedures and treating other patients.  Critical care was necessary  to treat or prevent imminent or life-threatening deterioration.  Critical care was time spent personally by me on the following activities: development of treatment plan with patient and/or surrogate as well as nursing, discussions with consultants, evaluation of patient's response to treatment, examination of patient, obtaining history from patient or surrogate, ordering and performing treatments and interventions, ordering and review of laboratory studies, ordering and review of radiographic studies, pulse oximetry and re-evaluation of patient's condition.   Labs Reviewed  PROTIME-INR - Abnormal; Notable for the following:    Prothrombin Time 23.9 (*)     INR 2.25 (*)     All other components within normal limits  APTT - Abnormal; Notable for the following:    aPTT 47 (*)     All other components within normal limits  CBC - Abnormal; Notable for the following:    WBC 3.3 (*)     Hemoglobin 12.5 (*)     Platelets 79 (*)  PLATELET COUNT CONFIRMED BY SMEAR   All other components within normal limits  DIFFERENTIAL - Abnormal; Notable for the following:    Neutrophils Relative 16 (*)     Neutro Abs 0.5 (*)     Lymphocytes Relative 50 (*)     Monocytes Relative 34 (*)     Monocytes Absolute 1.1 (*)     All other components within normal limits  COMPREHENSIVE METABOLIC PANEL - Abnormal; Notable for the following:    Glucose, Bld 110 (*)     Total Protein 9.7 (*)     GFR calc non Af Amer 75 (*)     GFR calc Af Amer 87 (*)     All other components within normal limits  POCT I-STAT, CHEM 8 - Abnormal; Notable for the following:    Glucose, Bld 111 (*)     All other components within normal limits  GLUCOSE, CAPILLARY - Abnormal; Notable for the following:    Glucose-Capillary 101 (*)     All other components within normal limits  TROPONIN I   Ct Head Wo Contrast  03/16/2012  *RADIOLOGY REPORT*  Clinical Data: Code stroke.  Aphasia and confusion.  CT HEAD WITHOUT CONTRAST  Technique:  Contiguous axial images were obtained from the base of the skull through the vertex without contrast.  Comparison: 11/29/2011  Findings: Encephalomalacia within the left parietal lobe is similar to previous MRI.  There is prominence of the sulci and ventricles consistent with brain atrophy.  Chronic right basal ganglia lacunar infarcts are again identified.  There is no evidence for acute brain infarct, hemorrhage or mass.  There is evidence of old trauma to the right orbit.  Mild mucosal thickening is identified involving the right maxillary sinus.  The skull is intact.  IMPRESSION:  1.  No acute intracranial abnormalities. 2.  Remote left parietal lobe infarct.   Original Report Authenticated By: Signa Kell, M.D.      1. Seizure       MDM  [facility] hospitalists contacted for admission.      Nelia Shi, MD 03/16/12 [phone]

## 2012-03-16 NOTE — Consult Note (Addendum)
Patient's PCP: Gwynneth Aliment, MD  Chief Complaint: Altered mental status  History of Present Illness: Terry Robertson is a 79 y.o. Caucasian male with history of coronary artery disease, diabetes, hypertension, stroke, seizure with status epilepticus, and history of acute respiratory failure due to status epilepticus requiring intubation in the past who presented with the above complaints.  Patient cannot provide any history at all.  Most of the history was obtained from reviewing the records and from the nurse.  It was noted that this afternoon around 2 o'clock patient was at his baseline then had difficulty with speech and weakness on the right side.  Code stroke was called.  Dr. Vonita Moss neurology evaluated the patient and noted eyes deviating conically to the right side with right lower extremity twitching.  He was given 2 mg of Ativan in the ER and was loaded on 1 gram of Keppra.  Review of Systems: Could not be obtained due to patient's altered mental status.  Past Medical History  Diagnosis Date  . Coronary artery disease   . Diabetes mellitus   . Hypertension   . Stroke   . Seizures    History reviewed. No pertinent past surgical history. No family history on file. History   Social History  . Marital Status: Married    Spouse Name: N/A    Number of Children: N/A  . Years of Education: N/A   Occupational History  . Not on file.   Social History Main Topics  . Smoking status: Former Smoker    Types: Cigarettes    Quit date: 03/08/1988  . Smokeless tobacco: Not on file  . Alcohol Use: No  . Drug Use: No  . Sexually Active: Not on file   Other Topics Concern  . Not on file   Social History Narrative  . No narrative on file   Family history: Could not be obtained due to patient's mental status.  Allergies: Review of patient's allergies indicates no known allergies.  Home Meds: Prior to Admission medications   Medication Sig Start Date End Date Taking? Authorizing  Provider  aspirin EC 81 MG tablet Take 81 mg by mouth daily.    Historical Provider, MD  atenolol (TENORMIN) 12.5 mg TABS Take 0.5 tablets (12.5 mg total) by mouth daily. 08/14/11   Wilburt Finlay, PA  diltiazem (CARDIZEM CD) 180 MG 24 hr capsule Take 1 capsule (180 mg total) by mouth daily. 08/14/11 08/13/12  Wilburt Finlay, PA  levETIRAcetam (KEPPRA) 500 MG tablet Take 1 tablet (500 mg total) by mouth 2 (two) times daily. 12/08/11   Vilinda Blanks Minor, NP  lisinopril (PRINIVIL,ZESTRIL) 10 MG tablet Take 1 tablet (10 mg total) by mouth daily. 08/14/11 08/13/12  Wilburt Finlay, PA  nitroGLYCERIN (NITROSTAT) 0.4 MG SL tablet Place 1 tablet (0.4 mg total) under the tongue every 5 (five) minutes x 3 doses as needed for chest pain. 08/14/11 08/13/12  Wilburt Finlay, PA  omega-3 acid ethyl esters (LOVAZA) 1 G capsule Take 2 g by mouth 2 (two) times daily.     Historical Provider, MD  simvastatin (ZOCOR) 20 MG tablet Take 1 tablet (20 mg total) by mouth daily. 12/08/11   Vilinda Blanks Minor, NP  terazosin (HYTRIN) 2 MG capsule Take 2 mg by mouth daily.    Historical Provider, MD  Vitamin D, Ergocalciferol, (DRISDOL) 50000 UNITS CAPS Take 500 Units by mouth.    Historical Provider, MD  warfarin (COUMADIN) 5 MG tablet Take 5 mg by mouth daily. 10 mg (2  tabs) on Monday, 7.5 mg (1 1/2 tabs) on all other days of the week    Historical Provider, MD    Physical Exam: Blood pressure 193/84, pulse 113, temperature 100.4 F (38 C), temperature source Rectal, resp. rate 23, SpO2 100.00%. General: Somnolent, arousable to painful stimuli, not oriented times, in some distress. HEENT: Eyes deviated to the right side, Moist mucous membranes Neck: Supple CV: S1 and S2 Lungs: Crackles at bases Abdomen: Soft, Nontender, Nondistended, +bowel sounds. Ext: Good pulses. Trace edema. No clubbing or cyanosis noted. Neuro: Cannot be done due to patient's compliance.  Patient response to painful stimuli.  Lab results:  [facility] 03/16/12 1745  03/16/12 1739  NA 138 144  K 3.6 3.7  CL 102 106  CO2 26 --  GLUCOSE 110* 111*  BUN 9 9  CREATININE 0.97 1.00  CALCIUM 9.3 --  MG -- --  PHOS -- --    Basename 03/16/12 1745  AST 24  ALT 14  ALKPHOS 51  BILITOT 0.3  PROT 9.7*  ALBUMIN 3.5   No results found for this basename: LIPASE:2,AMYLASE:2 in the last 72 hours  Basename 03/16/12 1745 03/16/12 1739  WBC 3.3* --  NEUTROABS 0.5* --  HGB 12.5* 13.6  HCT 39.7 40.0  MCV 84.6 --  PLT 79* --    Basename 03/16/12 1745  CKTOTAL --  CKMB --  CKMBINDEX --  TROPONINI <0.30   No components found with this basename: POCBNP:3 No results found for this basename: DDIMER in the last 72 hours No results found for this basename: HGBA1C:2 in the last 72 hours No results found for this basename: CHOL:2,HDL:2,LDLCALC:2,TRIG:2,CHOLHDL:2,LDLDIRECT:2 in the last 72 hours No results found for this basename: TSH,T4TOTAL,FREET3,T3FREE,THYROIDAB in the last 72 hours No results found for this basename: VITAMINB12:2,FOLATE:2,FERRITIN:2,TIBC:2,IRON:2,RETICCTPCT:2 in the last 72 hours Imaging results:  Ct Head Wo Contrast  03/16/2012  *RADIOLOGY REPORT*  Clinical Data: Code stroke.  Aphasia and confusion.  CT HEAD WITHOUT CONTRAST  Technique:  Contiguous axial images were obtained from the base of the skull through the vertex without contrast.  Comparison: 11/29/2011  Findings: Encephalomalacia within the left parietal lobe is similar to previous MRI.  There is prominence of the sulci and ventricles consistent with brain atrophy.  Chronic right basal ganglia lacunar infarcts are again identified.  There is no evidence for acute brain infarct, hemorrhage or mass.  There is evidence of old trauma to the right orbit.  Mild mucosal thickening is identified involving the right maxillary sinus.  The skull is intact.  IMPRESSION:  1.  No acute intracranial abnormalities. 2.  Remote left parietal lobe infarct.   Original Report Authenticated By: Signa Kell, M.D.    Other results: EKG: Sinus rhythm with a heart rate of 84.  Assessment & Plan by Problem: Focal seizure?/History of status epilepticus Patient still having seizure.  Continue Ativan 1-2 mg every hour as needed.  Patient loaded with Keppra 1 g IV.  Per neurology continue Keppra 1 g twice daily.  As patient still having seizures requested pulmonary critical care consultation.  Patient has a gag reflex and per Dr. Radford Pax ED physician is able to protect his airway.  Check lactic acid and CK.  N.p.o. until mentation improves.  MRI of the brain ordered by neurology, head CT showed no acute intracranial abnormalities, showed remote left parietal lobe infarct.  Acute respiratory failure Currently satting 100% on nonrebreather.  Initially set at 77% on arrival.  Will get a chest x-ray.  Likely  related to seizure.  Uncontrolled hypertension Likely due to ongoing seizure activity.  When necessary hydralazine for systolic greater than 160.  Patient on diltiazem, lisinopril, and atenolol which has been held.  Altered mental status/acute delirium/acute encephalopathy Likely due to seizure.  History of CVA Resume aspirin once able to take oral intake.  A. Fib Rate controlled currently in sinus.  INR therapeutic.  Diabetes? Hemoglobin A1c on 08/09/2011 was 5.9.  Not on any diabetic medication.  Do not suspect him to be diabetic at this time.  Given patient's ongoing seizure and somnolence.  Patient may require higher level of care, requested pulmonary critical care to assess the patient.  Will sign off for now.  Please call with any questions or concerns.  Time spent on consult, talking to the patient, and coordinating care was: 60 mins.  Bayan Kushnir A, MD 03/16/2012, 6:47 PM

## 2012-03-16 NOTE — ED Notes (Signed)
Pt in route to MRI

## 2012-03-17 DIAGNOSIS — I2510 Atherosclerotic heart disease of native coronary artery without angina pectoris: Secondary | ICD-10-CM

## 2012-03-17 DIAGNOSIS — I10 Essential (primary) hypertension: Secondary | ICD-10-CM

## 2012-03-17 DIAGNOSIS — I6340 Cerebral infarction due to embolism of unspecified cerebral artery: Secondary | ICD-10-CM

## 2012-03-17 DIAGNOSIS — E785 Hyperlipidemia, unspecified: Secondary | ICD-10-CM

## 2012-03-17 LAB — CBC
MCV: 85.1 fL (ref 78.0–100.0)
Platelets: 70 10*3/uL — ABNORMAL LOW (ref 150–400)
RDW: 15.4 % (ref 11.5–15.5)
WBC: 3.8 10*3/uL — ABNORMAL LOW (ref 4.0–10.5)

## 2012-03-17 LAB — BASIC METABOLIC PANEL
Chloride: 102 mEq/L (ref 96–112)
Creatinine, Ser: 0.87 mg/dL (ref 0.50–1.35)
GFR calc Af Amer: >90 mL/min (ref >90)
GFR calc non Af Amer: 79 mL/min — ABNORMAL LOW (ref >90)

## 2012-03-17 LAB — GLUCOSE, CAPILLARY
Glucose-Capillary: 87 mg/dL (ref 70–99)
Glucose-Capillary: 79 mg/dL (ref 70–99)

## 2012-03-17 LAB — INFLUENZA PANEL BY PCR (TYPE A & B)
H1N1 flu by pcr: NOT DETECTED
Influenza A By PCR: NEGATIVE
Influenza B By PCR: NEGATIVE

## 2012-03-17 MED ORDER — POTASSIUM CHLORIDE 10 MEQ/100ML IV SOLN
10.0000 meq | INTRAVENOUS | Status: AC
  Administered 2012-03-17 (×4): 10 meq via INTRAVENOUS
  Filled 2012-03-17: qty 300

## 2012-03-17 MED ORDER — ACETAMINOPHEN 650 MG RE SUPP
650.0000 mg | Freq: Four times a day (QID) | RECTAL | Status: DC | PRN
  Administered 2012-03-18 – 2012-03-19 (×2): 650 mg via RECTAL
  Filled 2012-03-17 (×5): qty 1

## 2012-03-17 NOTE — Progress Notes (Signed)
eLink Physician-Brief Progress Note Patient Name: Terry Robertson DOB: 17-May-1928 MRN: [id number]  Date of Service  03/17/2012   HPI/Events of Note  Patient with fever to 102.4 F is HD stable.  Blood cultures/urine/resp obtained.  Is HFlu negative.     eICU Interventions  Plan: Continue to hold ABX Tylenol prn for fever   Intervention Category Minor Interventions: Routine modifications to care plan (e.g. PRN medications for pain, fever)  DETERDING,ELIZABETH 03/17/2012, 11:41 PM

## 2012-03-17 NOTE — Progress Notes (Signed)
eLink Physician-Brief Progress Note Patient Name: Terry Robertson DOB: 08/02/1928 MRN: [id number]  Date of Service  03/17/2012   HPI/Events of Note  Hypokalemia   eICU Interventions  Potassium replaced   Intervention Category Minor Interventions: Electrolytes abnormality - evaluation and management  Enaya Howze 03/17/2012, 7:05 AM

## 2012-03-17 NOTE — Progress Notes (Signed)
Name: Terry Robertson MRN: [id number] DOB: [birth year]    LOS: 1  Referring Provider:  Dr. Betti Cruz  Reason for Referral:  Seizure with concern for status epilepticus  PULMONARY / CRITICAL CARE MEDICINE  HPI:  [name] is an 79 y/o male with past medical history of prior CVA, known seizure d/o on keppra, a-fib, HTN and CAD who presented to the [facility] ED via EMS after developing aphasia and right sided weakness around 2pm.  He was evaluated by neurology for code stroke but during his head CT had tonic/clonic seizure activity in his right arm and leg with rightward eye deviation.  The code stroke was cancelled and he was given 2mg  ativan with resolution of seizure activity.  He has had a head CT which showed no acute infarct or bleed and is currently getting a brain MRI.  He was evaluated by [facility] for admission but there was concern for ongoing seizure activity and PCCM was consulted.   In speaking with a friend who is here with him she reports that he has had runny nose and congestion for the last few days.  No known fevers.   The sequelae of his previous stroke were right sided weekness and slowed speech/aphasia.  His friend reports that these do seem to come back out again if he gets stressed or frustrated.   Past Medical History  Diagnosis Date  . Coronary artery disease   . Diabetes mellitus   . Hypertension   . Stroke   . Seizures    History reviewed. No pertinent past surgical history.  Prior to Admission medications   Medication Sig Start Date End Date Taking? Authorizing Provider  aspirin EC 81 MG tablet Take 81 mg by mouth daily.    Historical Provider, MD  atenolol (TENORMIN) 12.5 mg TABS Take 0.5 tablets (12.5 mg total) by mouth daily. 08/14/11   Wilburt Finlay, PA  diltiazem (CARDIZEM CD) 180 MG 24 hr capsule Take 1 capsule (180 mg total) by mouth daily. 08/14/11 08/13/12  Wilburt Finlay, PA  levETIRAcetam (KEPPRA) 500 MG tablet Take 1 tablet (500 mg total) by mouth 2 (two)  times daily. 12/08/11   Vilinda Blanks Minor, NP  lisinopril (PRINIVIL,ZESTRIL) 10 MG tablet Take 1 tablet (10 mg total) by mouth daily. 08/14/11 08/13/12  Wilburt Finlay, PA  nitroGLYCERIN (NITROSTAT) 0.4 MG SL tablet Place 1 tablet (0.4 mg total) under the tongue every 5 (five) minutes x 3 doses as needed for chest pain. 08/14/11 08/13/12  Wilburt Finlay, PA  omega-3 acid ethyl esters (LOVAZA) 1 G capsule Take 2 g by mouth 2 (two) times daily.     Historical Provider, MD  simvastatin (ZOCOR) 20 MG tablet Take 1 tablet (20 mg total) by mouth daily. 12/08/11   Vilinda Blanks Minor, NP  terazosin (HYTRIN) 2 MG capsule Take 2 mg by mouth daily.    Historical Provider, MD  Vitamin D, Ergocalciferol, (DRISDOL) 50000 UNITS CAPS Take 500 Units by mouth.    Historical Provider, MD  warfarin (COUMADIN) 5 MG tablet Take 5 mg by mouth daily. 10 mg (2 tabs) on Monday, 7.5 mg (1 1/2 tabs) on all other days of the week    Historical Provider, MD   Allergies No Known Allergies    Brief patient description:  79 y/o pt with prior cva, afib and seizure d/o who presented with seizure  Events Since Admission: 1/12> awake, sl lethargic, no seizure activ, no focal weakness  Vital Signs: Temp:  [98.2 F (36.8  C)-100.4 F (38 C)] 99 F (37.2 C) (01/12 0400) Pulse Rate:  [84-114] 84  (01/12 0736) Resp:  [14-25] 25  (01/12 0736) BP: (128-193)/(60-93) 152/72 mmHg (01/12 0736) SpO2:  [77 %-100 %] 97 % (01/12 0736) Weight:  [115.6 kg (254 lb 13.6 oz)] 115.6 kg (254 lb 13.6 oz) (01/11 2045)  Physical Examination: General:  Obese male, appears younger than stated age, responsive and able to answer yes or no questions  Neuro:  PERRL, EOMI, no eye deviation, right ptosis HEENT:  OP clear Neck:  obese Cardiovascular:  NRRR, no mrg Lungs:  CTAB, no wrr, initially sonourous but improved with raising the head of the bed Abdomen:  Obese, NTND, +BS, no HSM Musculoskeletal:  Moving all 4 extremities spontaneously, able to squeeze my hand  on the left, not on right,  Skin:  No lesions   Principal Problem:  *Status epilepticus, generalized convulsive Active Problems:  CAD (coronary artery disease) PCI to circumflex in 1997.  100% occluded RCA  HTN (hypertension)  DM type 2 (diabetes mellitus, type 2)  Atrial fibrillation  Acute respiratory failure  Altered mental status   ASSESSMENT AND PLAN  PULMONARY No results found for this basename: PHART:5,PCO2:5,PCO2ART:5,PO2ART:5,HCO3:5,O2SAT:5 in the last 168 hours Ventilator Settings:   CXR 1/11:  Lower lung volumes. Increased interstitial and patchy bibasilar opacity could reflect interstitial edema and atelectasis versus bilateral respiratory infection.  ETT:  Not iintubated  A:  O2 saturations recorded as 77% on admission, currently 100% on non-rebreather P:   Titrate FiO2 to SpO2 >92% Monitor respiratory status closely   CARDIOVASCULAR  Lab 03/16/12 1844 03/16/12 1745  TROPONINI -- <0.30  LATICACIDVEN 0.9 --  PROBNP -- --   ECG:  A-fib Lines: PIV  A: atrial fibrillation on multiple rate control agents at home, HTN P:  On atenolol and diltiazem at home for rate control and HTN Holding PO meds for now due to aspiration risk, may require NG tube for drug administration or IV agents for rate control.   RENAL  Lab 03/17/12 0500 03/16/12 1745 03/16/12 1739  NA 138 138 144  K 3.3* 3.6 --  CL 102 102 106  CO2 25 26 --  BUN 8 9 9   CREATININE 0.87 0.97 1.00  CALCIUM 9.0 9.3 --  MG -- -- --  PHOS -- -- --   Intake/Output      01/11 0701 - 01/12 0700 01/12 0701 - 01/13 0700   I.V. (mL/kg) 500 (4.3)    Total Intake(mL/kg) 500 (4.3)    Urine (mL/kg/hr) 900 (0.3)    Total Output 900    Net -400          Foley:  66F placed 1/11  A:  No current problems P:   Strict Is and Os Daily BMP   GASTROINTESTINAL  Lab 03/16/12 1745  AST 24  ALT 14  ALKPHOS 51  BILITOT 0.3  PROT 9.7*  ALBUMIN 3.5   A:  Risk of aspiration P:   NPO until speech  eval can be obtained   HEMATOLOGIC  Lab 03/17/12 0500 03/16/12 1745 03/16/12 1739  HGB 11.3* 12.5* 13.6  HCT 36.5* 39.7 40.0  PLT 70* 79* --  INR -- 2.25* --  APTT -- 47* --   A:  History of Myelodysplastic syndrome/ multiple myeloma with leukopenia and thrombocytopenia> followed by [facility]adad, last seen 10/13- note reviewed in Epic... LABS> Hg=11.3  WBC=3.8  Plat=70K and stable... P:  INR 2.5 - hold coumadin for now, start lovenox  or heparin when INR less than 2   INFECTIOUS  Lab 03/17/12 0500 03/16/12 1745  WBC 3.8* 3.3*  PROCALCITON -- --   Cultures: Blood, urine Antibiotics: No antibiotics at this time  A:  Low threshold to start antibiotics with leukopenia from multiple myeloma P:   Obtain rapid flu swab Blood and urine cx obtained on admission, will follow   NEUROLOGIC MRI 1/11> IMPRESSION:  1. Difficult to exclude acute lacunar infarct in the dorsal left thalamus, but this may be artifact as there is no associated new T2  or FLAIR signal abnormality.  2. No other acute intracranial abnormality. Chronic posterior left MCA infarct. Chronic deep gray matter small vessel ischemia  A:  Seizure activity, rule out stroke P:   Seen and evaluated by neurology, was not thought to be in status at that time Loaded with 1g keppra, dose increased to 1g BID 2mg  ativan prn seizure activity lasting longer than 5 min or more than 3 seizures in a period   BEST PRACTICE / DISPOSITION Level of Care:  ICU Primary Service:  PCCM Consultants:  neurology Code Status:  full Diet:  full DVT Px:  SCDs GI Px:  None indicatied Skin Integrity:  good Social / Family:  At bedside, lives with son    Michele Mcalpine, M.D. Pulmonary and Critical Care Medicine [facility] Pager: [phone] 03/17/2012, 10:32 AM

## 2012-03-18 DIAGNOSIS — D469 Myelodysplastic syndrome, unspecified: Secondary | ICD-10-CM

## 2012-03-18 DIAGNOSIS — R4182 Altered mental status, unspecified: Secondary | ICD-10-CM

## 2012-03-18 DIAGNOSIS — R569 Unspecified convulsions: Secondary | ICD-10-CM

## 2012-03-18 DIAGNOSIS — I4891 Unspecified atrial fibrillation: Secondary | ICD-10-CM

## 2012-03-18 LAB — URINE CULTURE
Culture: NO GROWTH
Special Requests: NORMAL

## 2012-03-18 MED ORDER — WARFARIN - PHARMACIST DOSING INPATIENT: Freq: Every day | Status: DC

## 2012-03-18 MED ORDER — METOPROLOL TARTRATE 1 MG/ML IV SOLN
2.5000 mg | INTRAVENOUS | Status: DC | PRN
  Administered 2012-03-18 – 2012-03-21 (×2): 5 mg via INTRAVENOUS
  Filled 2012-03-18 (×2): qty 5

## 2012-03-18 MED ORDER — WARFARIN SODIUM 5 MG IV SOLR
7.5000 mg | Freq: Once | INTRAVENOUS | Status: AC
  Administered 2012-03-18: 7.5 mg via INTRAVENOUS
  Filled 2012-03-18: qty 3.75

## 2012-03-18 MED ORDER — KCL IN DEXTROSE-NACL 40-5-0.45 MEQ/L-%-% IV SOLN
INTRAVENOUS | Status: DC
  Administered 2012-03-18 – 2012-03-21 (×4): via INTRAVENOUS
  Filled 2012-03-18 (×8): qty 1000

## 2012-03-18 NOTE — Progress Notes (Signed)
Name: Terry Robertson MRN: [id number] DOB: 1929-02-25    LOS: 2  Referring Provider:  Dr. Betti Cruz  Reason for Referral:  Seizure with concern for status epilepticus  PULMONARY / CRITICAL CARE MEDICINE        Brief patient description:  79 y/o pt with prior cva, afib and seizure d/o who presented with seizure  SUBJ: Poorly oriented. RASS 0. No distress  Vital Signs: Temp:  [97.9 F (36.6 C)-102.4 F (39.1 C)] 98.7 F (37.1 C) (01/13 1600) Pulse Rate:  [103-112] 106  (01/13 1600) Resp:  [12-31] 29  (01/13 1600) BP: (117-149)/(58-84) 117/67 mmHg (01/13 1600) SpO2:  [94 %-98 %] 97 % (01/13 1600) Weight:  [116.9 kg (257 lb 11.5 oz)] 116.9 kg (257 lb 11.5 oz) (01/13 0500)  Physical Examination: General:  NAD  Neuro:  PERRL, EOMI, MAEs HEENT:  WNL Neck: No JVD noted Cardiovascular:  RRR, no M Lungs: Clear Abdomen:  Soft, NT, NABS Ext: no edema  BMET    Component Value Date/Time   NA 138 03/17/2012 0500   NA 139 01/02/2012 1526   K 3.3* 03/17/2012 0500   K 3.8 01/02/2012 1526   CL 102 03/17/2012 0500   CL 109* 01/02/2012 1526   CO2 25 03/17/2012 0500   CO2 26 01/02/2012 1526   GLUCOSE 88 03/17/2012 0500   GLUCOSE 118* 01/02/2012 1526   BUN 8 03/17/2012 0500   BUN 12.0 01/02/2012 1526   CREATININE 0.87 03/17/2012 0500   CREATININE 1.0 01/02/2012 1526   CALCIUM 9.0 03/17/2012 0500   CALCIUM 9.2 01/02/2012 1526   GFRNONAA 79* 03/17/2012 0500   GFRAA >90 03/17/2012 0500    CBC    Component Value Date/Time   WBC 3.8* 03/17/2012 0500   WBC 2.3* 01/02/2012 1526   RBC 4.29 03/17/2012 0500   RBC 4.27 01/02/2012 1526   HGB 11.3* 03/17/2012 0500   HGB 11.3* 01/02/2012 1526   HCT 36.5* 03/17/2012 0500   HCT 34.4* 01/02/2012 1526   PLT 70* 03/17/2012 0500   PLT 84* 01/02/2012 1526   MCV 85.1 03/17/2012 0500   MCV 80.6 01/02/2012 1526   MCH 26.3 03/17/2012 0500   MCH 26.4* 01/02/2012 1526   MCHC 31.0 03/17/2012 0500   MCHC 32.8 01/02/2012 1526   RDW 15.4 03/17/2012 0500   RDW 16.1*  01/02/2012 1526   LYMPHSABS 1.6 03/16/2012 1745   LYMPHSABS 1.0 01/02/2012 1526   MONOABS 1.1* 03/16/2012 1745   MONOABS 0.7 01/02/2012 1526   EOSABS 0.0 03/16/2012 1745   EOSABS 0.0 01/02/2012 1526   BASOSABS 0.0 03/16/2012 1745   BASOSABS 0.0 01/02/2012 1526    CXR: no new  Principal Problem:  *Status epilepticus, generalized convulsive Active Problems:  Altered mental status  HTN (hypertension)  DM type 2 (diabetes mellitus, type 2)  Hx of AF, NSR presently  Acute respiratory failure, resolved  CAD (coronary artery disease) PCI to circumflex in 1997.  100% occluded RCA Suspect prolonged postictal state Documented MDS/MM - followed b yShaddad Thrombocytopenia Low grade fever - no evident site of infection  PLAN Cont Keppra Remains NPO until MS improves Resume anticoagulation - IV warfarin until able to take PO Watch in SDU until cognition improves Monitor closely off abx   Billy Fischer, MD ; [facility] service Mobile [phone].  After 5:30 PM or weekends, call [phone]

## 2012-03-18 NOTE — Progress Notes (Signed)
Left nare nasal trumpet d/c'd per MD order, patient well will continue to monitor.

## 2012-03-18 NOTE — Progress Notes (Signed)
Utilization review completed.  

## 2012-03-18 NOTE — Progress Notes (Signed)
ANTICOAGULATION CONSULT NOTE - Initial Consult  Pharmacy Consult for Coumadin Indication: atrial fibrillation  No Known Allergies  Patient Measurements: Height: 5\' 8"  (172.7 cm) Weight: 257 lb 11.5 oz (116.9 kg) IBW/kg (Calculated) : 68.4  Heparin Dosing Weight:   Vital Signs: Temp: 101.4 F (38.6 C) (01/13 1200) Temp src: Oral (01/13 1200) BP: 142/68 mmHg (01/13 1200) Pulse Rate: 112  (01/13 1200)  Labs:  Basename 03/17/12 0500 03/16/12 1846 03/16/12 1745 03/16/12 1739  HGB 11.3* -- 12.5* --  HCT 36.5* -- 39.7 40.0  PLT 70* -- 79* --  APTT -- -- 47* --  LABPROT -- -- 23.9* --  INR -- -- 2.25* --  HEPARINUNFRC -- -- -- --  CREATININE 0.87 -- 0.97 1.00  CKTOTAL -- 220 -- --  CKMB -- -- -- --  TROPONINI -- -- <0.30 --    Estimated Creatinine Clearance: 82.7 ml/min (by C-G formula based on Cr of 0.87).   Medical History: Past Medical History  Diagnosis Date  . Coronary artery disease   . Diabetes mellitus   . Hypertension   . Stroke   . Seizures     Medications:  Prescriptions prior to admission  Medication Sig Dispense Refill  . aspirin EC 81 MG tablet Take 81 mg by mouth daily.      Marland Kitchen atenolol (TENORMIN) 12.5 mg TABS Take 0.5 tablets (12.5 mg total) by mouth daily.  303 tablet  5  . diltiazem (CARDIZEM CD) 180 MG 24 hr capsule Take 1 capsule (180 mg total) by mouth daily.  30 capsule  5  . levETIRAcetam (KEPPRA) 500 MG tablet Take 1 tablet (500 mg total) by mouth 2 (two) times daily.  60 tablet  1  . lisinopril (PRINIVIL,ZESTRIL) 10 MG tablet Take 1 tablet (10 mg total) by mouth daily.  303 tablet  5  . nitroGLYCERIN (NITROSTAT) 0.4 MG SL tablet Place 1 tablet (0.4 mg total) under the tongue every 5 (five) minutes x 3 doses as needed for chest pain.  25 tablet  5  . omega-3 acid ethyl esters (LOVAZA) 1 G capsule Take 2 g by mouth 2 (two) times daily.       . simvastatin (ZOCOR) 20 MG tablet Take 1 tablet (20 mg total) by mouth daily.  30 tablet  0  .  terazosin (HYTRIN) 2 MG capsule Take 2 mg by mouth daily.      . Vitamin D, Ergocalciferol, (DRISDOL) 50000 UNITS CAPS Take 500 Units by mouth.      . warfarin (COUMADIN) 5 MG tablet Take 5 mg by mouth daily. 10 mg (2 tabs) on Monday, 7.5 mg (1 1/2 tabs) on all other days of the week        Assessment: 83yom on Coumadin PTA for hx Afib. Coumadin has been held since admission - pharmacy now consulted to restart Coumadin using IV form until taking POs (confirmed with Dr. Sung Amabile). INR on 1/11 PM was therapeutic at 2.25. With limited nutritional intake, will restart Coumadin conservatively.  - H/H trended down - Plts low (chronic thrombocytopenia with h/o multiple myeloma)  Goal of Therapy:  INR 2-3   Plan:  1. Coumadin 7.5mg  IV x 1 today 2. Daily INR 3. Follow-up nutritional status/intake  Terry Robertson [phone] 03/18/2012,1:34 PM

## 2012-03-18 NOTE — Progress Notes (Signed)
When i arrived on shift there was only 3 rails up and no other restraints on pt will continue to monitor

## 2012-03-19 ENCOUNTER — Inpatient Hospital Stay (HOSPITAL_COMMUNITY): Payer: Medicare Other

## 2012-03-19 DIAGNOSIS — J9600 Acute respiratory failure, unspecified whether with hypoxia or hypercapnia: Secondary | ICD-10-CM

## 2012-03-19 DIAGNOSIS — R509 Fever, unspecified: Secondary | ICD-10-CM

## 2012-03-19 LAB — CBC
Hemoglobin: 12.7 g/dL — ABNORMAL LOW (ref 13.0–17.0)
MCH: 26.3 pg (ref 26.0–34.0)
MCHC: 31.3 g/dL (ref 30.0–36.0)
MCV: 84.2 fL (ref 78.0–100.0)
RBC: 4.82 MIL/uL (ref 4.22–5.81)

## 2012-03-19 LAB — URINALYSIS, ROUTINE W REFLEX MICROSCOPIC
Glucose, UA: NEGATIVE mg/dL
Nitrite: POSITIVE — AB
Specific Gravity, Urine: 1.024 (ref 1.005–1.030)
pH: 5.5 (ref 5.0–8.0)

## 2012-03-19 LAB — BASIC METABOLIC PANEL
BUN: 16 mg/dL (ref 6–23)
CO2: 24 mEq/L (ref 19–32)
Glucose, Bld: 137 mg/dL — ABNORMAL HIGH (ref 70–99)
Potassium: 4.1 mEq/L (ref 3.5–5.1)
Sodium: 140 mEq/L (ref 135–145)

## 2012-03-19 LAB — GLUCOSE, CAPILLARY: Glucose-Capillary: 125 mg/dL — ABNORMAL HIGH (ref 70–99)

## 2012-03-19 MED ORDER — INSULIN ASPART 100 UNIT/ML ~~LOC~~ SOLN: 0.0000 [IU] | Freq: Every day | SUBCUTANEOUS | Status: DC

## 2012-03-19 MED ORDER — DILTIAZEM HCL ER COATED BEADS 180 MG PO CP24
180.0000 mg | ORAL_CAPSULE | Freq: Every day | ORAL | Status: DC
  Filled 2012-03-19 (×2): qty 1

## 2012-03-19 MED ORDER — INSULIN ASPART 100 UNIT/ML ~~LOC~~ SOLN
0.0000 [IU] | SUBCUTANEOUS | Status: DC
  Administered 2012-03-20: 3 [IU] via SUBCUTANEOUS
  Administered 2012-03-20 – 2012-03-21 (×3): 2 [IU] via SUBCUTANEOUS

## 2012-03-19 MED ORDER — WARFARIN SODIUM 5 MG IV SOLR
10.0000 mg | Freq: Once | INTRAVENOUS | Status: AC
  Administered 2012-03-19: 10 mg via INTRAVENOUS
  Filled 2012-03-19 (×3): qty 5

## 2012-03-19 MED ORDER — INSULIN ASPART 100 UNIT/ML ~~LOC~~ SOLN: 0.0000 [IU] | Freq: Three times a day (TID) | SUBCUTANEOUS | Status: DC

## 2012-03-19 MED ORDER — ACETAMINOPHEN 325 MG PO TABS: 650.0000 mg | ORAL_TABLET | ORAL | Status: DC | PRN

## 2012-03-19 NOTE — Progress Notes (Signed)
ANTICOAGULATION CONSULT NOTE - Follow Up Consult  Pharmacy Consult for Coumadin Indication: atrial fibrillation  No Known Allergies  Patient Measurements: Height: 5\' 8"  (172.7 cm) Weight: 257 lb 0.9 oz (116.6 kg) IBW/kg (Calculated) : 68.4  Heparin Dosing Weight:   Vital Signs: Temp: 98.6 F (37 C) (01/14 0700) Temp src: Oral (01/14 0700) BP: 117/51 mmHg (01/14 0700) Pulse Rate: 108  (01/14 0700)  Labs:  Basename 03/19/12 0500 03/17/12 0500 03/16/12 1846 03/16/12 1745  HGB 12.7* 11.3* -- --  HCT 40.6 36.5* -- 39.7  PLT 62* 70* -- 79*  APTT -- -- -- 47*  LABPROT 16.8* -- -- 23.9*  INR 1.40 -- -- 2.25*  HEPARINUNFRC -- -- -- --  CREATININE 1.09 0.87 -- 0.97  CKTOTAL -- -- 220 --  CKMB -- -- -- --  TROPONINI -- -- -- <0.30    Estimated Creatinine Clearance: 65.9 ml/min (by C-G formula based on Cr of 1.09).  Assessment:  83yom on Coumadin PTA for hx Afib. Coumadin was restarted on 1/13 - IV form requested until taking POs. INR (1.4) is subtherapeutic due to held doses since admission. PTA regimen updated to: 7.5mg  daily except 10mg  on Mondays, Wednesdays and Fridays.  - H/H improving, Plts trending down (chronic thrombocytopenia with h/o multiple myeloma) - No significant bleeding reported  Goal of Therapy:  INR 2-3   Plan:  1. Coumadin 10mg  IV x 1 today 2. Follow-up AM INR  Cleon Dew [phone] 03/19/2012,10:19 AM

## 2012-03-19 NOTE — Progress Notes (Signed)
Name: Terry Robertson MRN: [id number] DOB: 1928-04-17    LOS: 3  Referring Provider:  Dr. Betti Cruz  Reason for Referral:  Seizure with concern for status epilepticus  PULMONARY / CRITICAL CARE MEDICINE   Brief patient description:  79 y/o pt with prior cva, afib and seizure d/o who presented with seizure and prolonged AMS, tenuous airway/respiratory status  SUBJ: Better oriented. More conversant. RASS 0. No respiratory distress  Vital Signs: Temp:  [98.6 F (37 C)-102.5 F (39.2 C)] 101.4 F (38.6 C) (01/14 1119) Pulse Rate:  [106-112] 108  (01/14 0700) Resp:  [22-35] 22  (01/14 0700) BP: (111-142)/(51-68) 117/51 mmHg (01/14 0700) SpO2:  [94 %-98 %] 98 % (01/14 0700) Weight:  [116.6 kg (257 lb 0.9 oz)] 116.6 kg (257 lb 0.9 oz) (01/14 0305)  Physical Examination: General:  NAD  Neuro:  PERRL, EOMI, MAEs weakly, L grip > R grip HEENT:  WNL Neck: No JVD noted Cardiovascular:  RRR, no M Lungs: Clear anteriorly Abdomen:  Soft, NT, NABS Ext: no edema  BMET    Component Value Date/Time   NA 140 03/19/2012 0500   NA 139 01/02/2012 1526   K 4.1 03/19/2012 0500   K 3.8 01/02/2012 1526   CL 105 03/19/2012 0500   CL 109* 01/02/2012 1526   CO2 24 03/19/2012 0500   CO2 26 01/02/2012 1526   GLUCOSE 137* 03/19/2012 0500   GLUCOSE 118* 01/02/2012 1526   BUN 16 03/19/2012 0500   BUN 12.0 01/02/2012 1526   CREATININE 1.09 03/19/2012 0500   CREATININE 1.0 01/02/2012 1526   CALCIUM 8.9 03/19/2012 0500   CALCIUM 9.2 01/02/2012 1526   GFRNONAA 62* 03/19/2012 0500   GFRAA 71* 03/19/2012 0500    CBC    Component Value Date/Time   WBC 10.1 03/19/2012 0500   WBC 2.3* 01/02/2012 1526   RBC 4.82 03/19/2012 0500   RBC 4.27 01/02/2012 1526   HGB 12.7* 03/19/2012 0500   HGB 11.3* 01/02/2012 1526   HCT 40.6 03/19/2012 0500   HCT 34.4* 01/02/2012 1526   PLT 62* 03/19/2012 0500   PLT 84* 01/02/2012 1526   MCV 84.2 03/19/2012 0500   MCV 80.6 01/02/2012 1526   MCH 26.3 03/19/2012 0500   MCH 26.4*  01/02/2012 1526   MCHC 31.3 03/19/2012 0500   MCHC 32.8 01/02/2012 1526   RDW 15.3 03/19/2012 0500   RDW 16.1* 01/02/2012 1526   LYMPHSABS 1.6 03/16/2012 1745   LYMPHSABS 1.0 01/02/2012 1526   MONOABS 1.1* 03/16/2012 1745   MONOABS 0.7 01/02/2012 1526   EOSABS 0.0 03/16/2012 1745   EOSABS 0.0 01/02/2012 1526   BASOSABS 0.0 03/16/2012 1745   BASOSABS 0.0 01/02/2012 1526    CXR: CM, vasc congestion  Principal Problem:  *Seizure, resolved Active Problems:  Altered mental status, Suspect prolonged postictal state  Improving  Fever, unclear etiology  BC 1/11 >> ngtd  UC 1/11 >> NEG  BC 1/14 >>   Urine 1/14 >>   HTN (hypertension), controlled  DM type 2 (diabetes mellitus, type 2)  Thrombocytopenia, chronic  Hx of AF, NSR presently  CAD (coronary artery disease) PCI to circumflex in 1997.  100% occluded RCA  Myelodysplastic syndrome - followed by Juliette Alcide  Acute respiratory failure, resolved   PLAN Watch in ICU X 24 hrs more Recultured 1/14 for fever, no abx unless deteriorates physiologically or site of infection ID'd Cont Keppra Begin diet 1/14 Begin SSI 1/14 Cont anticoagulation - IV warfarin until able to take PO Resume daily  dose of diltiazem Holding other home meds  Discussed with [facility]. They will assume care as of AM 1/15 and PCCM will sign off  Billy Fischer, MD ; [facility] [phone].  After 5:30 PM or weekends, call [phone]

## 2012-03-19 NOTE — Progress Notes (Signed)
INITIAL NUTRITION ASSESSMENT  DOCUMENTATION CODES Per approved criteria  -Obesity Unspecified   INTERVENTION:  Recommend swallow evaluation with SLP to determine safest diet consistency for patient.  NUTRITION DIAGNOSIS: Inadequate oral intake related to suspected swallowing difficulty as evidenced by NPO status.   Goal: Intake to meet >90% of estimated nutrition needs.  Monitor:  PO intake, swallowing function, labs, weight trend.  Reason for Assessment: Low Braden  79 y.o. male  Admitting Dx: Seizure  ASSESSMENT: Patient presented to the [facility] ED via EMS after developing aphasia and right sided weakness. He was evaluated by neurology for code stroke but during his head CT had tonic/clonic seizure activity in his right arm and leg with rightward eye deviation; code stroke was cancelled.    Patient has been NPO since admission.  RN reports that diet was advanced this morning, but patient coughed when taking pills; concern for aspiration/dysphagia.   Height: Ht Readings from Last 1 Encounters:  03/16/12 5\' 8"  (1.727 m)    Weight: Wt Readings from Last 1 Encounters:  03/19/12 257 lb 0.9 oz (116.6 kg)    Ideal Body Weight: 70 kg   % Ideal Body Weight: 167%  Wt Readings from Last 10 Encounters:  03/19/12 257 lb 0.9 oz (116.6 kg)  01/02/12 239 lb 14.4 oz (108.818 kg)  12/07/11 240 lb 12.8 oz (109.226 kg)  08/14/11 236 lb 15.9 oz (107.5 kg)  06/14/11 259 lb 14.4 oz (117.89 kg)    Usual Body Weight: ~240 lb  % Usual Body Weight: 107%  BMI:  Body mass index is 39.09 kg/(m^2).  Estimated Nutritional Needs: Kcal: 1900-2100 Protein: 110-120 gm Fluid: ~ 2 L  Skin: no problems noted  Diet Order: Carb Control  EDUCATION NEEDS: -Education not appropriate at this time   Intake/Output Summary (Last 24 hours) at 03/19/12 1330 Last data filed at 03/19/12 1208  Gross per 24 hour  Intake   1575 ml  Output   1075 ml  Net    500 ml    Labs:   Lab  03/19/12 0500 03/17/12 0500 03/16/12 1745  NA 140 138 138  K 4.1 3.3* 3.6  CL 105 102 102  CO2 24 25 26   BUN 16 8 9   CREATININE 1.09 0.87 0.97  CALCIUM 8.9 9.0 9.3  MG -- -- --  PHOS -- -- --  GLUCOSE 137* 88 110*    CBG (last 3)   Basename 03/19/12 1157 03/18/12 03/17/12 2202  GLUCAP 125* 94 79    Scheduled Meds:   . diltiazem  180 mg Oral Daily  . insulin aspart  0-15 Units Subcutaneous TID WC  . insulin aspart  0-5 Units Subcutaneous QHS  . levetiracetam  1,000 mg Intravenous Q12H  . Warfarin - Pharmacist Dosing Inpatient   Does not apply q1800  . warfarin IV  10 mg Intravenous ONCE-1800    Continuous Infusions:   . dextrose 5 % and 0.45 % NaCl with KCl 40 mEq/L 10 mL/hr at 03/19/12 1219    Past Medical History  Diagnosis Date  . Coronary artery disease   . Diabetes mellitus   . Hypertension   . Stroke   . Seizures     History reviewed. No pertinent past surgical history.  Joaquin Courts, RD, LDN, CNSC Pager# [phone] After Hours Pager# [phone]

## 2012-03-19 NOTE — Evaluation (Signed)
Physical Therapy Evaluation Patient Details Name: Terry Robertson MRN: [id number] DOB: 1928-06-17 Today's Date: 03/19/2012 Time: 1330-1400 PT Time Calculation (min): 30 min  PT Assessment / Plan / Recommendation Clinical Impression  79 y.o. male with a history of previous left parietal stroke, seizure disorder, hypertension, diabetes mellitus and coronary artery disease, presenting with speech difficulty and reduced movement of his right extremities. Patient arrived in code stroke status. He was noted to have markedly reduced speech output as well as eyes deviated to the right side. He was also noted not to be moving his right side, as well. CT scan of his head showed old left parietal stroke. There were no acute findings. While in radiology the patient had a witnessed focal motor seizure involving his right arm right leg and right side of his face. Eyes were deviated tonically to the right side as well.   Pt with overall cognitive and speech impairment limiting evaluation.  Pt preservating on "yes" and difficulty with expressive and receptive language.  RN also stated pt having difficulty swallowing pills.  Pt wil benefit from speech consult for swallowing and cognition.  Pt will benefit from acute PT services to improve overall mobility, balance and strength.  Pt will benefit from inpatient rehab.    PT Assessment  Patient needs continued PT services    Follow Up Recommendations  CIR    Barriers to Discharge None      Equipment Recommendations  None recommended by PT    Recommendations for Other Services Rehab consult;Speech consult   Frequency Min 3X/week    Precautions / Restrictions Precautions Precautions: Fall Restrictions Weight Bearing Restrictions: No   Pertinent Vitals/Pain Unable to rate      Mobility  Bed Mobility Bed Mobility: Supine to Sit;Sitting - Scoot to Edge of Bed Supine to Sit: 1: +2 Total assist;With rails Supine to Sit: Patient Percentage: 40% Sitting - Scoot  to Edge of Bed: 2: Max assist Details for Bed Mobility Assistance: +2 (A) to elevate trunk OOB and advance hips and LE to EOB with max verbal and visual cues. Transfers Transfers: Sit to Stand;Stand to Sit;Stand Pivot Transfers Sit to Stand: 1: +2 Total assist;From bed Sit to Stand: Patient Percentage: 40% Stand to Sit: 1: +2 Total assist;To chair/3-in-1 Stand to Sit: Patient Percentage: 40% Stand Pivot Transfers: 1: +2 Total assist Stand Pivot Transfers: Patient Percentage: 50% Details for Transfer Assistance: +2 (A) to initiate transfer and slowly descend to chair with max visual, tactile and manual cues for proper technique.  (A) to advance hips and LE to recliner with stand pivot transfer.  Pt difficulty with motor planning and following commands Ambulation/Gait Ambulation/Gait Assistance: Not tested (comment) Stairs: No Wheelchair Mobility Wheelchair Mobility: No Modified Rankin (Stroke Patients Only) Pre-Morbid Rankin Score: No significant disability Modified Rankin: Severe disability     PT Diagnosis: Difficulty walking;Abnormality of gait;Generalized weakness  PT Problem List: Decreased strength;Decreased activity tolerance;Decreased balance;Decreased mobility;Decreased knowledge of use of DME;Decreased coordination;Decreased cognition;Decreased knowledge of precautions PT Treatment Interventions: DME instruction;Gait training;Stair training;Therapeutic activities;Functional mobility training;Therapeutic exercise;Balance training;Neuromuscular re-education;Patient/family education   PT Goals Acute Rehab PT Goals PT Goal Formulation: Patient unable to participate in goal setting Time For Goal Achievement: 04/02/12 Potential to Achieve Goals: Good Pt will go Supine/Side to Sit: with min assist PT Goal: Supine/Side to Sit - Progress: Goal set today Pt will Sit at Edge of Bed: with supervision;1-2 min PT Goal: Sit at Edge Of Bed - Progress: Goal set today Pt will go Sit to  Supine/Side: with min assist PT Goal: Sit to Supine/Side - Progress: Goal set today Pt will go Sit to Stand: with min assist PT Goal: Sit to Stand - Progress: Goal set today Pt will go Stand to Sit: with min assist PT Goal: Stand to Sit - Progress: Goal set today Pt will Transfer Bed to Chair/Chair to Bed: with min assist PT Transfer Goal: Bed to Chair/Chair to Bed - Progress: Goal set today Pt will Stand: with mod assist;1 - 2 min PT Goal: Stand - Progress: Goal set today Pt will Ambulate: 1 - 15 feet;with +2 total assist PT Goal: Ambulate - Progress: Goal set today  Visit Information  Last PT Received On: 03/19/12 Assistance Needed: +2    Subjective Data  Subjective: "Yes I know your a woman." (Pt continues to repeat himself) Patient Stated Goal: family wishes for pt to receive further therapy and possible d/c to [facility] with daughter   Prior Functioning  Home Living Lives With: Daughter Available Help at Discharge: Available 24 hours/day Type of Home: House Home Access: Stairs to enter Secretary/administrator of Steps: 2 Entrance Stairs-Rails: None Home Layout: Two level (plans to put stair lift in house) Bathroom Shower/Tub: Walk-in shower;Door Bathroom Toilet: Standard Bathroom Accessibility: Yes Prior Function Level of Independence: Needs assistance;Independent with assistive device(s) (intermittent use of SPC) Needs Assistance: Bathing;Dressing;Meal Prep;Light Housekeeping Bath: Supervision/set-up Dressing: Supervision/set-up Meal Prep: Minimal Light Housekeeping: Minimal Able to Take Stairs?: Yes Driving: Yes (was driving ) Vocation: Retired Musician: Other (comment) ("He had difficulty finding his words at times."  (per family) Dominant Hand: Right    Cognition  Overall Cognitive Status: Impaired Area of Impairment: Attention;Memory;Following commands;Safety/judgement;Awareness of deficits;Problem solving Arousal/Alertness:  (needs  constant cues to arouse) Orientation Level:  (Pt continues to repeat "yes" to all questions) Behavior During Session: Lethargic Current Attention Level: Focused Memory:  (unable to assess) Following Commands:  (Unable to follow commands due to receptive difficulties) Safety/Judgement:  (unable to fully assess) Problem Solving: Pt slow to process and difficulty problem solving    Extremity/Trunk Assessment Right Upper Extremity Assessment RUE ROM/Strength/Tone: Unable to fully assess;Due to impaired cognition (Unable to due to grip with verbal and visual cues) RUE Sensation:  (unable to assess) Left Upper Extremity Assessment LUE ROM/Strength/Tone: Unable to fully assess;Due to impaired cognition Right Lower Extremity Assessment RLE ROM/Strength/Tone: Unable to fully assess;Due to impaired cognition (Pt able to extend right LE sitting EOB with visual cues) RLE Sensation: Deficits (unable to fully assess) Left Lower Extremity Assessment LLE ROM/Strength/Tone: Unable to fully assess;Due to impaired cognition (knee extend on EOB with visual cues) LLE Sensation: Deficits (unable to assess due to cognition)   Balance Balance Balance Assessed: Yes Static Sitting Balance Static Sitting - Balance Support: Feet supported Static Sitting - Level of Assistance: 3: Mod assist;4: Min assist Static Sitting - Comment/# of Minutes: occasional mod (A) due to right sided lean with tactile and manual cues to promote midline  End of Session PT - End of Session Equipment Utilized During Treatment: Gait belt Activity Tolerance: Patient tolerated treatment well Patient left: in chair;with call bell/phone within reach;with family/visitor present Nurse Communication: Mobility status  GP     Jemmie Rhinehart 03/19/2012, 3:31 PM [place], PT DPT [phone]

## 2012-03-19 NOTE — Progress Notes (Signed)
Attempted to give patient water with his medicine. Patient pocketed pill and coughed with the water. Notified MD and will keep patient NPO. MD does not want to order a swallow eval at this time. Will continue to monitor.

## 2012-03-20 LAB — COMPREHENSIVE METABOLIC PANEL
AST: 31 U/L (ref 0–37)
Alkaline Phosphatase: 43 U/L (ref 39–117)
BUN: 19 mg/dL (ref 6–23)
CO2: 21 mEq/L (ref 19–32)
Chloride: 108 mEq/L (ref 96–112)
Creatinine, Ser: 1.01 mg/dL (ref 0.50–1.35)
GFR calc non Af Amer: 68 mL/min — ABNORMAL LOW (ref >90)
Potassium: 3.9 mEq/L (ref 3.5–5.1)
Total Bilirubin: 0.6 mg/dL (ref 0.3–1.2)

## 2012-03-20 LAB — GLUCOSE, CAPILLARY
Glucose-Capillary: 115 mg/dL — ABNORMAL HIGH (ref 70–99)
Glucose-Capillary: 110 mg/dL — ABNORMAL HIGH (ref 70–99)
Glucose-Capillary: 184 mg/dL — ABNORMAL HIGH (ref 70–99)

## 2012-03-20 LAB — HEMOGLOBIN A1C: Hgb A1c MFr Bld: 5.7 % — ABNORMAL HIGH (ref <5.7)

## 2012-03-20 LAB — CBC
MCH: 27.6 pg (ref 26.0–34.0)
Platelets: 62 10*3/uL — ABNORMAL LOW (ref 150–400)
RBC: 4.75 MIL/uL (ref 4.22–5.81)
RDW: 15.6 % — ABNORMAL HIGH (ref 11.5–15.5)
WBC: 11.9 10*3/uL — ABNORMAL HIGH (ref 4.0–10.5)

## 2012-03-20 MED ORDER — DEXTROSE 5 % IV SOLN
1.0000 g | INTRAVENOUS | Status: DC
  Administered 2012-03-20 – 2012-03-24 (×5): 1 g via INTRAVENOUS
  Filled 2012-03-20 (×5): qty 10

## 2012-03-20 MED ORDER — ACETAMINOPHEN 650 MG RE SUPP
650.0000 mg | RECTAL | Status: DC | PRN
  Administered 2012-03-20: 650 mg via RECTAL
  Filled 2012-03-20 (×2): qty 1

## 2012-03-20 MED ORDER — WARFARIN SODIUM 5 MG IV SOLR
10.0000 mg | Freq: Once | INTRAVENOUS | Status: AC
  Administered 2012-03-20: 10 mg via INTRAVENOUS
  Filled 2012-03-20 (×2): qty 5

## 2012-03-20 MED ORDER — METOPROLOL TARTRATE 1 MG/ML IV SOLN
5.0000 mg | Freq: Four times a day (QID) | INTRAVENOUS | Status: DC
  Administered 2012-03-20 – 2012-03-21 (×5): 5 mg via INTRAVENOUS
  Filled 2012-03-20 (×9): qty 5

## 2012-03-20 NOTE — Consult Note (Signed)
Physical Medicine and Rehabilitation Consult Reason for Consult: Seizures Referring Physician:  Dr. Sharon Seller.    HPI: Terry Robertson is a 79 y.o. 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 He was admitted on 03/16/12 with markedly reduced speech output as well as eyes deviated to the right side. He was also noted not to be moving his right side, as well. CT scan of his head showed old left parietal stroke. There were no acute findings. While in radiology the patient had a witnessed focal motor seizure involving his right arm right leg and right side of his face. Eyes were deviated tonically to the right side as well. He was given with 2 mg of Ativan. He's been taking Keppra 500 mg twice a day. A loading dose of Keppra 1 g was ordered to be given IV. Patient's seizure activity stopped following administration of initial dose of Ativan. He remained confused. MRI of brain without acute changes.  Neurology consulted and recommended increasing Keppra to 1000 mg bid. PT evaluation done and patient was noted to have difficulty following commands as well as problems with motor planning. ST evaluation done and patient started on D1 diet, thin liquids due to impaired mentation and decreased responses.  Noted to be febrile on 01/15 and was started on IV rocephin for suspicion of UTI.  MD, PT, ST recommending CIR.   Diaphoretic but otherwise no c/o Review of Systems  Unable to perform ROS: mental acuity   Past Medical History  Diagnosis Date  . Coronary artery disease   . Diabetes mellitus   . Hypertension   . Stroke   . Seizures    History reviewed. No pertinent past surgical history.  No family history on file.  Social History: Lives with son who provides 24 hours supervision. Per  reports that he quit smoking about 24 years ago. His smoking use included Cigarettes. He  does not have any smokeless tobacco history on file. Per reports that he does not drink alcohol or use illicit drugs.  Allergies: No Known Allergies  Medications Prior to Admission  Medication Sig Dispense Refill  . aspirin EC 81 MG tablet Take 81 mg by mouth daily.      Marland Kitchen atenolol (TENORMIN) 12.5 mg TABS Take 0.5 tablets (12.5 mg total) by mouth daily.  303 tablet  5  . diltiazem (CARDIZEM CD) 180 MG 24 hr capsule Take 1 capsule (180 mg total) by mouth daily.  30 capsule  5  . levETIRAcetam (KEPPRA) 500 MG tablet Take 1 tablet (500 mg total) by mouth 2 (two) times daily.  60 tablet  1  . lisinopril (PRINIVIL,ZESTRIL) 10 MG tablet Take 1 tablet (10 mg total) by mouth daily.  303 tablet  5  . omega-3 acid ethyl esters (LOVAZA) 1 G capsule Take 2 g by mouth 2 (two) times daily.       . simvastatin (ZOCOR) 20 MG tablet Take 1 tablet (20 mg total) by mouth daily.  30 tablet  0  . terazosin (HYTRIN) 2 MG capsule Take 2 mg by mouth daily.      . Vitamin D, Ergocalciferol, (DRISDOL) 50000 UNITS CAPS Take 50,000 Units by mouth 2 (two) times a week. Takes 1 capsule on Wednesday and Saturday every week      . warfarin (COUMADIN) 5 MG tablet Take 7.5-10 mg by mouth daily. Takes 10mg  on Monday, Wednesday, and Friday. Takes 1.5  tablets on Sunday, Tuesday, Thursday, and Saturday        Home: Home Living Lives With: Daughter Available Help at Discharge: Available 24 hours/day Type of Home: House Home Access: Stairs to enter [employer] of Steps: 2 Entrance Stairs-Rails: None Home Layout: Two level Bathroom Shower/Tub: Walk-in shower;Door [employer] Toilet: Pharmacist, community: Yes  Functional History: Prior Function Bath: Supervision/set-up Dressing: Supervision/set-up Meal Prep: Minimal Light Housekeeping: Minimal Able to Take Stairs?: Yes Driving: Yes Vocation: Retired Functional Status:  Mobility: Bed Mobility Bed Mobility: Supine to Sit;Sitting - Scoot to Edge of  Bed Supine to Sit: 1: +2 Total assist;With rails Supine to Sit: Patient Percentage: 50% Sitting - Scoot to Edge of Bed: 2: Max assist Transfers Transfers: Sit to Stand;Stand to [employer] Transfers Sit to Stand: 1: +2 Total assist;From bed Sit to Stand: Patient Percentage: 30% Stand to Sit: 1: +2 Total assist;To chair/3-in-1 Stand to Sit: Patient Percentage: 30% Stand Pivot Transfers: 1: +2 Total assist Stand Pivot Transfers: Patient Percentage: 40% Ambulation/Gait Ambulation/Gait Assistance: Not tested (comment) Stairs: No Wheelchair Mobility Wheelchair Mobility: No  ADL: ADL Lower Body Bathing: +2 Total assistance Where Assessed - Lower Body Bathing: Supported sit to stand (total (A) for peri care ) Toilet Transfer: +2 Total assistance Toilet Transfer Method: Stand pivot;Sit to stand Toilet Transfer Equipment: Raised toilet seat with arms (or 3-in-1 over toilet) Equipment Used: Gait belt Transfers/Ambulation Related to ADLs: Pt with strong Lt lean during stand pivot transfer. pt required (A) to moved LT LE toward chair. pt requries total +2 to facilitate weight shift ADL Comments: Pt   Cognition: Cognition Arousal/Alertness: Awake/alert Orientation Level: Oriented to person;Disoriented to place;Disoriented to time;Disoriented to situation Cognition Overall Cognitive Status: Impaired Area of Impairment: Attention;Memory;Following commands;Safety/judgement;Awareness of deficits;Problem solving Arousal/Alertness: Awake/alert Orientation Level:  (unknown- oriented to name) Behavior During Session: Flat affect Current Attention Level: Focused Memory:  (unable to assess) Following Commands: Follows one step commands inconsistently;Follows one step commands with increased time Safety/Judgement:  (unable to fully assess) Problem Solving: Pt slow to process information and required visual, tactile and auditory cues to follow commands  Blood pressure 132/65, pulse 95,  temperature 98.8 F (37.1 C), temperature source Oral, resp. rate 25, height 5\' 8"  (1.727 m), weight 111.3 kg (245 lb 6 oz), SpO2 93.00%. Physical Exam  Nursing note and vitals reviewed. Constitutional: He appears well-developed and well-nourished.  HENT:  Head: Normocephalic.  Eyes: Pupils are equal, round, and reactive to light.  Neck: Normal range of motion.  Cardiovascular: Regular rhythm.  Tachycardia present.   Pulmonary/Chest: Effort normal and breath sounds normal.  Abdominal: Soft. Bowel sounds are normal.  Neurological: He is alert.       Confused, comfabulatory speech. Unable to state or recognize name. Moves all four but difficulty following basic commands. Needs redirection due to distractibility.   Motor 5/5 in bilateral Deltoid, biceps, triceps, grip 4/5 in bilateral hip flexors knee extensors ankle dorsiflexors plantar flexors Mood and affect without lability or agitation however appears fatigued. He is oriented to person and place but not time Results for orders placed during the hospital encounter of 03/16/12 (from the past 24 hour(s))  GLUCOSE, CAPILLARY     Status: Abnormal   Collection Time   03/20/12 12:36 PM      Component Value Range   Glucose-Capillary 138 (*) 70 - 99 mg/dL   Comment 1 Notify RN     Comment 2 Documented in Chart    GLUCOSE, CAPILLARY  Status: Abnormal   Collection Time   03/20/12  4:36 PM      Component Value Range   Glucose-Capillary 184 (*) 70 - 99 mg/dL   Comment 1 Notify RN     Comment 2 Documented in Chart    GLUCOSE, CAPILLARY     Status: Abnormal   Collection Time   03/20/12  8:16 PM      Component Value Range   Glucose-Capillary 140 (*) 70 - 99 mg/dL   Comment 1 Notify RN     Comment 2 Documented in Chart    GLUCOSE, CAPILLARY     Status: Abnormal   Collection Time   03/20/12 11:43 PM      Component Value Range   Glucose-Capillary 122 (*) 70 - 99 mg/dL   Comment 1 Notify RN     Comment 2 Documented in Chart    GLUCOSE,  CAPILLARY     Status: Abnormal   Collection Time   03/21/12  4:00 AM      Component Value Range   Glucose-Capillary 119 (*) 70 - 99 mg/dL   Comment 1 Notify RN     Comment 2 Documented in Chart    PROTIME-INR     Status: Abnormal   Collection Time   03/21/12  4:50 AM      Component Value Range   Prothrombin Time 23.2 (*) 11.6 - 15.2 seconds   INR 2.16 (*) 0.00 - 1.49  COMPREHENSIVE METABOLIC PANEL     Status: Abnormal   Collection Time   03/21/12  4:50 AM      Component Value Range   Sodium 146 (*) 135 - 145 mEq/L   Potassium 4.2  3.5 - 5.1 mEq/L   Chloride 113 (*) 96 - 112 mEq/L   CO2 22  19 - 32 mEq/L   Glucose, Bld 131 (*) 70 - 99 mg/dL   BUN 27 (*) 6 - 23 mg/dL   Creatinine, Ser 1.61  0.50 - 1.35 mg/dL   Calcium 8.7  8.4 - 09.6 mg/dL   Total Protein 8.9 (*) 6.0 - 8.3 g/dL   Albumin 2.5 (*) 3.5 - 5.2 g/dL   AST 45 (*) 0 - 37 U/L   ALT 17  0 - 53 U/L   Alkaline Phosphatase 42  39 - 117 U/L   Total Bilirubin 0.5  0.3 - 1.2 mg/dL   GFR calc non Af Amer 57 (*) >90 mL/min   GFR calc Af Amer 66 (*) >90 mL/min  CBC     Status: Abnormal   Collection Time   03/21/12  4:50 AM      Component Value Range   WBC 9.9  4.0 - 10.5 K/uL   RBC 4.48  4.22 - 5.81 MIL/uL   Hemoglobin 11.9 (*) 13.0 - 17.0 g/dL   HCT 04.5 (*) 40.9 - 81.1 %   MCV 84.2  78.0 - 100.0 fL   MCH 26.6  26.0 - 34.0 pg   MCHC 31.6  30.0 - 36.0 g/dL   RDW 91.4 (*) 78.2 - 95.6 %   Platelets 67 (*) 150 - 400 K/uL  GLUCOSE, CAPILLARY     Status: Abnormal   Collection Time   03/21/12  7:46 AM      Component Value Range   Glucose-Capillary 108 (*) 70 - 99 mg/dL   Dg Chest Port 1 View  03/21/2012  *RADIOLOGY REPORT*  Clinical Data: Focal infiltrate.  PORTABLE CHEST - 1 VIEW  Comparison: 03/19/2012.  Findings:  Cardiomegaly.  Pulmonary vascular congestion.  Bilateral basilar atelectasis.  Aortic arch atherosclerosis.  The patient mildly rotated to the left.  Compared to 03/19/2012, there is little interval change.   IMPRESSION: No interval change.  Persistent cardiomegaly, basilar atelectasis and pulmonary vascular congestion.   Original Report Authenticated By: Andreas Newport, M.D.     Assessment/Plan: Diagnosis: Deconditioning after seizure as well as UTI and recurrent A. fib 1. Does the need for close, 24 hr/day medical supervision in concert with the patient's rehab needs make it unreasonable for this patient to be served in a less intensive setting? Potentially 2. Co-Morbidities requiring supervision/potential complications: Hypertension, tachycardia, seizure disorder, morbid obesity 3. Due to bladder management, bowel management, safety, skin/wound care, disease management, medication administration, pain management and patient education, does the patient require 24 hr/day rehab nursing? Potentially 4. Does the patient require coordinated care of a physician, rehab nurse, PT (0.5-1 hrs/day, 5 days/week), OT (0.5-1 hrs/day, 5 days/week) and SLP (0.5-1 hrs/day, 55 days/week) to address physical and functional deficits in the context of the above medical diagnosis(es)? Potentially Addressing deficits in the following areas: balance, endurance, locomotion, strength, transferring, bowel/bladder control, bathing, dressing, toileting and cognition 5. Can the patient actively participate in an intensive therapy program of at least 3 hrs of therapy per day at least 5 days per week? No 6. The potential for patient to make measurable gains while on inpatient rehab is poor and not able to tolerate 7. Anticipated functional outcomes upon discharge from inpatient rehab are Not applicable with PT, Not applicable with OT, Not applicable with SLP. RECOMMENDATIONS: This patient's condition is appropriate for continued rehabilitative care in the following setting: SNF Patient has agreed to participate in recommended program. Potentially Note that insurance prior authorization may be required for reimbursement for recommended  care.  Comment:Rehabilitation RN follow progress, If endurance improves then may be a CIR candidate    03/21/2012

## 2012-03-20 NOTE — Progress Notes (Signed)
[facility] Progress Note [place] TEAM 1 - Stepdown/ICU TEAM   Terry Robertson NWG:[id number] DOB: 27-Nov-1928 DOA: 03/16/2012 PCP: Gwynneth Aliment, MD  Brief narrative: 79 y.o. male with history of coronary artery disease, diabetes, hypertension, stroke, seizure with status epilepticus, and history of acute respiratory failure due to status epilepticus requiring intubation in the past who presented with altered mental status on 03/16/12. It was noted that the pt suddenly had difficulty with speech and weakness on the right side. Dr. Vonita Moss with neurology evaluated the patient and noted eyes deviating clonically to the right side with right lower extremity twitching. He was given 2 mg of Ativan in the ER and was loaded on 1 gram of Keppra.  The pt was admitted to the PCCM service due to risk for intubation.  Intubation was ultimately note required.  Neuro did not perform and f/u visits.  The hospital course has been complicated by recurring low grade fevers of unclear origin.    Assessment/Plan:  Recurrent, breakthrough focal seizure activity with focal motor activity / altered mental status  Neuro has not seen since admit consult was completed - Keppra was increased to 1000mg  BID - MRI raised a question of acute lacunar infract in dorsal L thalamus, but was noncommittal to this diagnosis - will ask Neuro to follow up (confirmed they will see him 1/16)  Acute respiratory failure Due to above - did not require intubation - resolved  Fever - UTI Temp to 102 today - all culture date negative to date but UA markedly + on 1/14 - initiate empiric abx and follow   Dysphagia Ask SLP to eval - f/u CXR in AM  Chronic afib On chronic coumadin tx - tachycardic but <120 - tachycardia likely being driven by fever - no change in cardiac meds today   DM CBG reasonably controlled at this time - follow w/ SSI  HTN BP not at goal - will adjust meds and follow trend   Hx of L brain CVA Chronic  right sided weekness and slowed speech/aphasia  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 Juliette Alcide - JAK2 mutation is negative - plt count was 84 in Oct - I suspect he has an early gram negative rod bacteremia of a urinary source (most likely E colit) and that this this is to blame for his declining plt count - at present he is not bleeding - will cont his anticoag for now, but if plts drop further may have to consider stopping coumadin and consulting Heme  CAD  Code Status: FULL Disposition Plan: SDU  Consultants: Neuro/Stroke Team   Antibiotics: Rocephin 1/15 >>  DVT prophylaxis: Coumadin via IV  HPI/Subjective: Pt is awake and conversant, but confused.  He denies any complaints, but his hx is not felt to be reliable.     Objective: Blood pressure 152/81, pulse 110, temperature 101.4 F (38.6 C), temperature source Oral, resp. rate 43, height 5\' 8"  (1.727 m), weight 111.3 kg (245 lb 6 oz), SpO2 97.00%.  Intake/Output Summary (Last 24 hours) at 03/20/12 1105 Last data filed at 03/20/12 0900  Gross per 24 hour  Intake    210 ml  Output    485 ml  Net   -275 ml     Exam: General: No acute respiratory distress but is tachypneic at present w/ RR 30 (during fever) Lungs: Clear to auscultation bilaterally w/ exception to mild bibasilar crackles Cardiovascular: tachycardic - regular  rhythm at present - no appreciable M Abdomen: Nontender, nondistended, soft, bowel sounds positive, no rebound, no ascites, no appreciable mass Extremities: trace B le edema w/o cyanosis or clubbing   Data Reviewed: Basic Metabolic Panel:  Lab 03/20/12 2130 03/19/12 0500 03/17/12 0500 03/16/12 1745 03/16/12 1739  NA 143 140 138 138 144  K 3.9 4.1 3.3* 3.6 3.7  CL 108 105 102 102 106  CO2 21 24 25 26  --  GLUCOSE 122* 137* 88 110* 111*  BUN 19 16 8 9 9   CREATININE 1.01 1.09 0.87 0.97 1.00  CALCIUM 9.0 8.9 9.0 9.3  --  MG -- -- -- -- --  PHOS -- -- -- -- --   Liver Function Tests:  Lab 03/20/12 0500 03/16/12 1745  AST 31 24  ALT 11 14  ALKPHOS 43 51  BILITOT 0.6 0.3  PROT 9.5* 9.7*  ALBUMIN 2.8* 3.5   CBC:  Lab 03/20/12 0500 03/19/12 0500 03/17/12 0500 03/16/12 1745 03/16/12 1739  WBC 11.9* 10.1 3.8* 3.3* --  NEUTROABS -- -- -- 0.5* --  HGB 13.1 12.7* 11.3* 12.5* 13.6  HCT 40.4 40.6 36.5* 39.7 40.0  MCV 85.1 84.2 85.1 84.6 --  PLT 62* 62* 70* 79* --   Cardiac Enzymes:  Lab 03/16/12 1846 03/16/12 1745  CKTOTAL 220 --  CKMB -- --  CKMBINDEX -- --  TROPONINI -- <0.30   CBG:  Lab 03/20/12 0809 03/20/12 0406 03/19/12 2347 03/19/12 2037 03/19/12 1646  GLUCAP 116* 115* 110* 102* 114*    Recent Results (from the past 240 hour(s))  MRSA PCR SCREENING     Status: Normal   Collection Time   03/16/12  9:13 PM      Component Value Range Status Comment   MRSA by PCR NEGATIVE  NEGATIVE Final   CULTURE, BLOOD (ROUTINE X 2)     Status: Normal (Preliminary result)   Collection Time   03/16/12  9:35 PM      Component Value Range Status Comment   Specimen Description BLOOD RIGHT ARM   Final    Special Requests BOTTLES DRAWN AEROBIC ONLY 5CC   Final    Culture  Setup Time 03/17/2012 04:04   Final    Culture     Final    Value:        BLOOD CULTURE RECEIVED NO GROWTH TO DATE CULTURE WILL BE HELD FOR 5 DAYS BEFORE ISSUING A FINAL NEGATIVE REPORT   Report Status PENDING   Incomplete   CULTURE, BLOOD (ROUTINE X 2)     Status: Normal (Preliminary result)   Collection Time   03/16/12  9:50 PM      Component Value Range Status Comment   Specimen Description BLOOD RIGHT HAND   Final    Special Requests BOTTLES DRAWN AEROBIC ONLY 3CC   Final    Culture  Setup Time 03/17/2012 04:04   Final    Culture     Final    Value:        BLOOD CULTURE RECEIVED NO GROWTH TO DATE CULTURE WILL BE HELD FOR 5 DAYS BEFORE ISSUING A FINAL NEGATIVE REPORT   Report Status PENDING   Incomplete   URINE CULTURE      Status: Normal   Collection Time   03/16/12 10:00 PM      Component Value Range Status Comment   Specimen Description URINE, CATHETERIZED   Final    Special Requests Normal   Final    Culture  Setup Time  03/17/2012 04:01   Final    Colony Count NO GROWTH   Final    Culture NO GROWTH   Final    Report Status 03/18/2012 FINAL   Final   CULTURE, BLOOD (ROUTINE X 2)     Status: Normal (Preliminary result)   Collection Time   03/19/12 12:10 PM      Component Value Range Status Comment   Specimen Description BLOOD HAND RIGHT   Final    Special Requests BOTTLES DRAWN AEROBIC AND ANAEROBIC 10CC EACH   Final    Culture  Setup Time 03/19/2012 19:22   Final    Culture     Final    Value:        BLOOD CULTURE RECEIVED NO GROWTH TO DATE CULTURE WILL BE HELD FOR 5 DAYS BEFORE ISSUING A FINAL NEGATIVE REPORT   Report Status PENDING   Incomplete   CULTURE, BLOOD (ROUTINE X 2)     Status: Normal (Preliminary result)   Collection Time   03/19/12 12:18 PM      Component Value Range Status Comment   Specimen Description BLOOD HAND LEFT   Final    Special Requests BOTTLES DRAWN AEROBIC AND ANAEROBIC 10CC EACH   Final    Culture  Setup Time 03/19/2012 19:22   Final    Culture     Final    Value:        BLOOD CULTURE RECEIVED NO GROWTH TO DATE CULTURE WILL BE HELD FOR 5 DAYS BEFORE ISSUING A FINAL NEGATIVE REPORT   Report Status PENDING   Incomplete      Studies:  Recent x-ray studies have been reviewed in detail by the Attending Physician  Scheduled Meds:  Reviewed in detail by the Attending Physician   Lonia Blood, MD [facility] Office  [phone] Pager [phone]  On-Call/Text Page:      [email or web address]      password [email or web address]  If 7PM-7AM, please contact night-coverage [email or web address] Password [email or web address] 03/20/2012, 11:05 AM   LOS: 4 days

## 2012-03-20 NOTE — Evaluation (Signed)
Clinical/Bedside Swallow Evaluation Patient Details  Name: Michelle Vanhise MRN: [id number] Date of Birth: [birth year]  Today's Date: 03/20/2012 Time: 7846-9629 SLP Time Calculation (min): 29 min  Past Medical History:  Past Medical History  Diagnosis Date  . Coronary artery disease   . Diabetes mellitus   . Hypertension   . Stroke   . Seizures    Past Surgical History: History reviewed. No pertinent past surgical history. HPI:  79 y.o. male with history of coronary artery disease, diabetes, hypertension, stroke, seizure with status epilepticus, and history of acute respiratory failure due to status epilepticus requiring intubation in the past who presented with altered mental status on 03/16/12. It was noted that the pt suddenly had difficulty with speech and weakness on the right side. Dr. Vonita Moss with neurology evaluated the patient and noted eyes deviating clonically to the right side with right lower extremity twitching. He was given 2 mg of Ativan in the ER and was loaded on 1 gram of Keppra.  The pt was admitted to the PCCM service due to risk for intubation.  Intubation was ultimately not required.   The hospital course has been complicated by recurring low grade fevers of unclear origin.  Lungs: Clear to auscultation bilaterally w/ exception to mild bibasilar crackles.  No prior dysphagia per wife.     Assessment / Plan / Recommendation Clinical Impression  Pt presents with continued decreased mentation, delayed motor responses and awareness, leading to a likely transient, acute dysphagia.  In addition, RR was reaching the mid-40s during assessment, exacerbating swallow dysfunction due to incoordination of the ventilatory/swallowing cycle.  Recommend initiating a dysphagia 1 diet with no liquids; assist with feeding.  SLP will follow for toleration and readiness for liquids.  Discussed with pt, wife, and dtr, who are in agreement with plan.    Aspiration Risk  Moderate    Diet  Recommendation Dysphagia 1 (Puree);No liquids   Medication Administration: Whole meds with puree Supervision: Full supervision/cueing for compensatory strategies Compensations: Slow rate Postural Changes and/or Swallow Maneuvers: Seated upright 90 degrees    Other  Recommendations Oral Care Recommendations: Oral care BID   Follow Up Recommendations  Inpatient Rehab    Frequency and Duration min 2x/week  2 weeks   Pertinent Vitals/Pain No pain    SLP Swallow Goals Patient will utilize recommended strategies during swallow to increase swallowing safety with: Supervision/safety   Swallow Study Prior Functional Status  Type of Home: House Lives With: Daughter Available Help at Discharge: Available 24 hours/day Vocation: Retired    General Date of Onset: 03/16/12 HPI: 79 y.o. male with history of coronary artery disease, diabetes, hypertension, stroke, seizure with status epilepticus, and history of acute respiratory failure due to status epilepticus requiring intubation in the past who presented with altered mental status on 03/16/12. It was noted that the pt suddenly had difficulty with speech and weakness on the right side. Dr. Vonita Moss with neurology evaluated the patient and noted eyes deviating clonically to the right side with right lower extremity twitching. He was given 2 mg of Ativan in the ER and was loaded on 1 gram of Keppra.  The pt was admitted to the PCCM service due to risk for intubation.  Intubation was ultimately note required.  Neuro did not perform and f/u visits.  The hospital course has been complicated by recurring low grade fevers of unclear origin.  Lungs: Clear to auscultation bilaterally w/ exception to mild bibasilar crackles Type of Study: Bedside swallow evaluation Diet Prior  to this Study: NPO Temperature Spikes Noted: Yes Respiratory Status: Supplemental O2 delivered via (comment) Behavior/Cognition: Doesn't follow directions;Requires cueing;Alert Oral  Cavity - Dentition: Adequate natural dentition Self-Feeding Abilities: Needs assist Patient Positioning: Upright in chair Baseline Vocal Quality: Clear Volitional Cough: Cognitively unable to elicit Volitional Swallow: Unable to elicit    Oral/Motor/Sensory Function Overall Oral Motor/Sensory Function: Impaired at baseline   Ice Chips Ice chips: Impaired Presentation: Spoon Oral Phase Functional Implications: Prolonged oral transit Pharyngeal Phase Impairments: Suspected delayed Swallow;Throat Clearing - Delayed;Cough - Delayed   Thin Liquid Thin Liquid: Not tested    Nectar Thick Nectar Thick Liquid: Not tested   Honey Thick Honey Thick Liquid: Not tested   Puree Puree: Impaired Presentation: Spoon Oral Phase Functional Implications: Prolonged oral transit Pharyngeal Phase Impairments: Suspected delayed Swallow;Multiple swallows   Solid   GO    Solid: Not tested      Marchelle Folks L. Samson Frederic, [place] CCC/SLP Pager [phone]  Blenda Mounts Laurice 03/20/2012,3:01 PM

## 2012-03-20 NOTE — Evaluation (Signed)
Occupational Therapy Evaluation Patient Details Name: Terry Robertson MRN: [id number] DOB: April 12, 1928 Today's Date: 03/20/2012 Time: 4098-1191 OT Time Calculation (min): 23 min  OT Assessment / Plan / Recommendation Clinical Impression  79 yo male admitted for suddenly had difficulty with speech and weakness on the right side. Dr. Vonita Moss with neurology evaluated the patient and noted eyes deviating clonically to the right side with right lower extremity twitching. history of coronary artery disease, diabetes, hypertension, stroke, seizure with status epilepticus, and history of acute respiratory failure due to status epilepticus requiring intubation in the past who presented with altered mental status on 03/16/12. Pt now with 102 temperature dark urine output RR 38-45. Ot to follow acutely. Recommend SNF for d/c planning    OT Assessment  Patient needs continued OT Services    Follow Up Recommendations  SNF    Barriers to Discharge      Equipment Recommendations  None recommended by OT    Recommendations for Other Services    Frequency  Min 2X/week    Precautions / Restrictions Precautions Precautions: Fall Restrictions Weight Bearing Restrictions: No   Pertinent Vitals/Pain RR 38-45 Temperature 102 Spoke with Dr Sharon Seller who is agreeable to patient OOB to chair at this time Pt noted to have dark urine output    ADL  Lower Body Bathing: +2 Total assistance Lower Body Bathing: Patient Percentage: 10% Where Assessed - Lower Body Bathing: Supported sit to stand (total (A) for peri care ) Toilet Transfer: +2 Total assistance Toilet Transfer: Patient Percentage: 30% Toilet Transfer Method: Stand pivot;Sit to stand Toilet Transfer Equipment: Raised toilet seat with arms (or 3-in-1 over toilet) Toileting - Clothing Manipulation and Hygiene: +2 Total assistance Toileting - Clothing Manipulation and Hygiene: Patient Percentage: 0% Where Assessed - Toileting Clothing Manipulation and  Hygiene: Sit to stand from 3-in-1 or toilet Equipment Used: Gait belt Transfers/Ambulation Related to ADLs: Pt with strong Lt lean during stand pivot transfer. pt required (A) to moved LT LE toward chair. pt requries total +2 to facilitate weight shift ADL Comments: Pt     OT Diagnosis: Generalized weakness;Cognitive deficits  OT Problem List: Decreased strength;Decreased activity tolerance;Decreased range of motion;Impaired balance (sitting and/or standing);Decreased cognition;Decreased safety awareness;Decreased knowledge of use of DME or AE;Decreased coordination;Cardiopulmonary status limiting activity;Impaired UE functional use OT Treatment Interventions: Self-care/ADL training;Neuromuscular education;DME and/or AE instruction;Therapeutic activities;Patient/family education;Balance training   OT Goals Acute Rehab OT Goals OT Goal Formulation: Patient unable to participate in goal setting Time For Goal Achievement: 04/03/12 Potential to Achieve Goals: Good ADL Goals Pt Will Transfer to Toilet: with max assist;Stand pivot transfer;3-in-1 ADL Goal: Toilet Transfer - Progress: Goal set today Miscellaneous OT Goals Miscellaneous OT Goal #1: Pt will sit EOB with static sitting balance Supervision as precursor to adls OT Goal: Miscellaneous Goal #1 - Progress: Goal set today Miscellaneous OT Goal #2: Pt will complete sit<>stand Max (A) as precusor for 3n1 transfer OT Goal: Miscellaneous Goal #2 - Progress: Goal set today  Visit Information  Last OT Received On: 03/20/12 Assistance Needed: +2 PT/OT Co-Evaluation/Treatment: Yes    Subjective Data  Subjective: "Pascal Lux"- Pt asked to state name. pt repeats the same answer for "when is your birthday?" "what year is it" "what is the name of this building" Patient Stated Goal: pt unable to participate at this time   Prior Functioning     Home Living Lives With: Daughter Available Help at Discharge: Available 24 hours/day Type of  Home: House Home Access: Stairs to enter Entrance  Stairs-Number of Steps: 2 Entrance Stairs-Rails: None Home Layout: Two level Bathroom Shower/Tub: Walk-in shower;Door Bathroom Toilet: Standard Bathroom Accessibility: Yes Prior Function Level of Independence: Needs assistance;Independent with assistive device(s) (occasional use of SPC) Needs Assistance: Bathing;Dressing;Meal Prep;Light Housekeeping Bath: Supervision/set-up Dressing: Supervision/set-up Meal Prep: Minimal Light Housekeeping: Minimal Able to Take Stairs?: Yes Driving: Yes Vocation: Retired Dominant Hand: Right         Vision/Perception Vision - Assessment Vision Assessment: Vision not tested Additional Comments: pt following therapist around room however pt not tracking completely to the right during session at times. Pt with cognitive deficits and global aphasia affecting visual testing.   Cognition  Overall Cognitive Status: Impaired Area of Impairment: Attention;Memory;Following commands;Safety/judgement;Awareness of deficits;Problem solving Arousal/Alertness: Awake/alert Orientation Level:  (unknown- oriented to name) Behavior During Session: Flat affect Current Attention Level: Focused Following Commands: Follows one step commands inconsistently;Follows one step commands with increased time Safety/Judgement:  (unable to fully assess) Problem Solving: Pt slow to process information and required visual, tactile and auditory cues to follow commands    Extremity/Trunk Assessment Right Upper Extremity Assessment RUE ROM/Strength/Tone: Unable to fully assess;Due to impaired cognition;Deficits RUE ROM/Strength/Tone Deficits: Pt demonstrates AROM of the hand wrist and elbow during session with gravity eliminated however not able to fully assess due to global aphasia and cognitive deficits. Pt resisting ROM on Rt UE during testing. Left Upper Extremity Assessment LUE ROM/Strength/Tone: Deficits;Unable to fully  assess;Due to impaired cognition LUE ROM/Strength/Tone Deficits: pt with decreased ROM observed. pt moving hand in gravity eliminated. Ot to further assess.     Mobility Bed Mobility Bed Mobility: Supine to Sit;Sitting - Scoot to Edge of Bed Supine to Sit: 1: +2 Total assist;With rails Supine to Sit: Patient Percentage: 50% Sitting - Scoot to Edge of Bed: 2: Max assist Details for Bed Mobility Assistance: +2 (A) to elevate trunk OOB and advance hips and LE to EOB with max verbal and visual cues. Transfers Sit to Stand: 1: +2 Total assist;From bed Sit to Stand: Patient Percentage: 30% Stand to Sit: 1: +2 Total assist;To chair/3-in-1 Stand to Sit: Patient Percentage: 30% Details for Transfer Assistance: +2 (A) to initiate transfer and slowly descend to recliner with max tactile, visual and manual cues for proper technique and advance hips to recliner with stand pivoty.  Pt tends to lean to the left side.     Shoulder Instructions     Exercise     Balance Balance Balance Assessed: Yes Static Sitting Balance Static Sitting - Balance Support: Feet supported;No upper extremity supported Static Sitting - Level of Assistance: 4: Min assist Static Sitting - Comment/# of Minutes: pt with lt lean and LOB Static Standing Balance Static Standing - Balance Support: Bilateral upper extremity supported;During functional activity Static Standing - Level of Assistance: 1: +2 Total assist;Patient percentage (comment) (40%) Static Standing - Comment/# of Minutes: leaning heavily to LT   End of Session OT - End of Session Activity Tolerance: Patient limited by fatigue Patient left: in chair;with call bell/phone within reach Nurse Communication: Mobility status;Precautions  GO     Lucile Shutters 03/20/2012, 2:35 PM Pager: [phone]

## 2012-03-20 NOTE — Clinical Social Work Placement (Addendum)
    Clinical Social Work Department CLINICAL SOCIAL WORK PLACEMENT NOTE 03/20/2012  Patient:  Terry Robertson, Terry Robertson  Account Number:  [id number] Admit date:  03/16/2012  Clinical Social Worker:  Theresia Bough, Theresia Majors  Date/time:  03/20/2012 01:47 PM  Clinical Social Work is seeking post-discharge placement for this patient at the following level of care:   SKILLED NURSING   (*CSW will update this form in Epic as items are completed)   03/20/2012  Patient/family provided with [facility] Department of Clinical Social Work's list of facilities offering this level of care within the geographic area requested by the patient (or if unable, by the patient's family).  03/20/2012  Patient/family informed of their freedom to choose among providers that offer the needed level of care, that participate in Medicare, Medicaid or managed care program needed by the patient, have an available bed and are willing to accept the patient.  03/20/2012  Patient/family informed of [facility]' ownership interest in [facility], as well as of the fact that they are under no obligation to receive care at this facility.  PASARR submitted to [employer] on 03/20/2012 PASARR number received from [employer] on exisiting  FL2 transmitted to all facilities in geographic area requested by pt/family on  03/20/2012 FL2 transmitted to all facilities within larger geographic area on   Patient informed that his/her managed care company has contracts with or will negotiate with  certain facilities, including the following:     Patient/family informed of bed offers received:  03/23/11 Patient chooses bed at  Physician recommends and patient chooses bed at    Patient to be transferred to  on   Patient to be transferred to facility by   The following physician request were entered in Epic:   Additional Comments:

## 2012-03-20 NOTE — Clinical Social Work Psychosocial (Signed)
     Clinical Social Work Department BRIEF PSYCHOSOCIAL ASSESSMENT 03/20/2012  Patient:  Terry Robertson, Terry Robertson     Account Number:  [id number]     Admit date:  03/16/2012  Clinical Social Worker:  Lourdes Sledge  Date/Time:  03/20/2012 11:27 AM  Referred by:  RN  Date Referred:  03/20/2012 Referred for  SNF Placement   Other Referral:   Interview type:  Other - See comment Other interview type:   CSW unable to reach pt son Windy Fast by phone and unable to leave a message. CSW observed that pt friend Terry Robertson was present in the room. Assessment completed with Terry Robertson who reports as pt significant other of 7 years.    PSYCHOSOCIAL DATA Living Status:  SIGNIFICANT OTHER Admitted from facility:   Level of care:   Primary support name:  Terry Robertson [phone] Primary support relationship to patient:  PARTNER Degree of support available:   Pt significant other present in the room. Pt resting in the bed and did not confirm degree of support available.    CURRENT CONCERNS Current Concerns  Post-Acute Placement   Other Concerns:   CIR vs. SNF    SOCIAL WORK ASSESSMENT / PLAN CSW observed that PT has recommended CIR for pt.    CSW visited pt room as CSW was unable to reach pt son. CSW introduced herself and role to pt signicant other Terry Robertson. Pt asleep in bed and still remains confused per RN. Pt girl friend.    Pt girlfriend Terry Robertson attempted to also reach pt son however unsuccessful. CSW informed pt girlfriend of PT recommendations for CIR. Pt girlfriend stated she and pt son would like CIR for pt however are agreeable to SNF placement at [facility] if CIR does not accept.    CSW to await a call from pt son to confirm discharge planning.    Once consent received CSW to do a SNF search   Assessment/plan status:  Psychosocial Support/Ongoing Assessment of Needs Other assessment/ plan:   Information/referral to community resources:   CSW provided pt girlfriend Ms. Terry Robertson a SNF list.      PATIENTS/FAMILYS RESPONSE TO PLAN OF CARE: Pt laying in bed resting however per RN pt presents confused. Assessment completed with pt girlfriend who pt lives with.    CSW awaiting to speak to pt son.

## 2012-03-20 NOTE — Progress Notes (Signed)
ANTICOAGULATION CONSULT NOTE - Follow Up Consult  Pharmacy Consult for Coumadin Indication: atrial fibrillation  No Known Allergies  Patient Measurements: Height: 5\' 8"  (172.7 cm) Weight: 245 lb 6 oz (111.3 kg) IBW/kg (Calculated) : 68.4  Heparin Dosing Weight:   Vital Signs: Temp: 101.4 F (38.6 C) (01/15 0807) Temp src: Oral (01/15 0807) BP: 148/79 mmHg (01/15 0400) Pulse Rate: 115  (01/15 0400)  Labs:  Basename 03/20/12 0500 03/19/12 0500  HGB 13.1 12.7*  HCT 40.4 40.6  PLT 62* 62*  APTT -- --  LABPROT 18.6* 16.8*  INR 1.61* 1.40  HEPARINUNFRC -- --  CREATININE 1.01 1.09  CKTOTAL -- --  CKMB -- --  TROPONINI -- --    Estimated Creatinine Clearance: 69.4 ml/min (by C-G formula based on Cr of 1.01).  Assessment: Terry Robertson on Coumadin PTA for hx Afib. Coumadin was restarted on 1/13 - IV form requested until taking POs. INR (1.61) is subtherapeutic but trended up with Coumadin 10mg  - will repeat. PTA regimen: 7.5mg  daily except 10mg  on Mondays, Wednesdays and Fridays. Patient remains NPO.  - H/H improving, Plts leveled off (chronic thrombocytopenia with h/o multiple myeloma)  - No significant bleeding reported  Goal of Therapy:  INR 2-3   Plan:  1. Repeat Coumadin 10mg  IV x 1 today 2. Follow-up AM INR  Cleon Dew [phone] 03/20/2012,8:32 AM

## 2012-03-20 NOTE — Progress Notes (Signed)
Physical Therapy Treatment Patient Details Name: Terry Robertson MRN: [id number] DOB: 05/15/1928 Today's Date: 03/20/2012 Time: 4098-1191 PT Time Calculation (min): 23 min  PT Assessment / Plan / Recommendation Comments on Treatment Session  Pt continues to be limited due to cognitive and speech impairment.  Pt continues to perseverate  on name throughout session with expressive and receptive deficits.  Pt with Speech consult ordered.  Pt now leaning and pushing toward left side with transfers and standing.    Follow Up Recommendations  CIR     Barriers to Discharge        Equipment Recommendations  None recommended by PT    Recommendations for Other Services Rehab consult;Speech consult  Frequency Min 3X/week   Plan Discharge plan remains appropriate;Frequency remains appropriate    Precautions / Restrictions Precautions Precautions: Fall   Pertinent Vitals/Pain Unable to rate; But answers "No" however unsure of accuracy    Mobility  Bed Mobility Bed Mobility: Supine to Sit;Sitting - Scoot to Edge of Bed Supine to Sit: 1: +2 Total assist;With rails Supine to Sit: Patient Percentage: 50% Sitting - Scoot to Edge of Bed: 2: Max assist Details for Bed Mobility Assistance: +2 (A) to elevate trunk OOB and advance hips and LE to EOB with max verbal and visual cues. Transfers Transfers: Sit to Stand;Stand to Sit;Stand Pivot Transfers Sit to Stand: 1: +2 Total assist;From bed Sit to Stand: Patient Percentage: 30% Stand to Sit: 1: +2 Total assist;To chair/3-in-1 Stand to Sit: Patient Percentage: 30% Stand Pivot Transfers: 1: +2 Total assist Stand Pivot Transfers: Patient Percentage: 40% Details for Transfer Assistance: +2 (A) to initiate transfer and slowly descend to recliner with max tactile, visual and manual cues for proper technique and advance hips to recliner with stand pivoty.  Pt tends to lean to the left side. Ambulation/Gait Ambulation/Gait Assistance: Not tested  (comment) Stairs: No Wheelchair Mobility Wheelchair Mobility: No Modified Rankin (Stroke Patients Only) Pre-Morbid Rankin Score: No significant disability Modified Rankin: Severe disability    Exercises     PT Diagnosis:    PT Problem List:   PT Treatment Interventions:     PT Goals Acute Rehab PT Goals PT Goal Formulation: Patient unable to participate in goal setting Time For Goal Achievement: 04/02/12 Potential to Achieve Goals: Good Pt will go Supine/Side to Sit: with min assist PT Goal: Supine/Side to Sit - Progress: Progressing toward goal Pt will Sit at Edge of Bed: with supervision;1-2 min PT Goal: Sit at Edge Of Bed - Progress: Progressing toward goal Pt will go Sit to Supine/Side: with min assist PT Goal: Sit to Supine/Side - Progress: Not met Pt will go Sit to Stand: with min assist PT Goal: Sit to Stand - Progress: Not met Pt will go Stand to Sit: with min assist PT Goal: Stand to Sit - Progress: Not met Pt will Transfer Bed to Chair/Chair to Bed: with min assist PT Transfer Goal: Bed to Chair/Chair to Bed - Progress: Not met Pt will Stand: with mod assist;1 - 2 min PT Goal: Stand - Progress: Progressing toward goal Pt will Ambulate: 1 - 15 feet;with +2 total assist PT Goal: Ambulate - Progress: Not met  Visit Information  Last PT Received On: 03/20/12 Assistance Needed: +2    Subjective Data  Subjective: "Terry Robertson." (Pt perseverating on name when asked other questions) Patient Stated Goal: family wishes for pt to receive further therapy and possible d/c to [facility] with daughter   Cognition  Overall Cognitive Status: Impaired Area  of Impairment: Attention;Memory;Following commands;Safety/judgement;Awareness of deficits;Problem solving Arousal/Alertness: Awake/alert Orientation Level:  (unable to fully assess due to expressive deficits) Behavior During Session: [facility] for tasks performed Following Commands:  (Unable to follow commands due to receptive  difficulties) Safety/Judgement:  (unable to fully assess) Problem Solving: Pt slow to process and needs extra time and visual cues to complete task.    Balance  Balance Balance Assessed: Yes Static Sitting Balance Static Sitting - Balance Support: Feet supported Static Sitting - Level of Assistance: 4: Min assist;5: Stand by assistance Static Sitting - Comment/# of Minutes: Initial min (A) to maintain upright posture and midline Static Standing Balance Static Standing - Balance Support: Bilateral upper extremity supported Static Standing - Level of Assistance: 1: +2 Total assist;Patient percentage (comment) (40%) Static Standing - Comment/# of Minutes: Pt leaning heavy to left side and pushing toward left side.  End of Session PT - End of Session Equipment Utilized During Treatment: Gait belt Activity Tolerance: Patient limited by fatigue (increase HR and RR) Patient left: in chair;with call bell/phone within reach;with family/visitor present Nurse Communication: Mobility status   GP     Terry Robertson 03/20/2012, 12:27 PM Jake Shark, PT DPT [phone]

## 2012-03-20 NOTE — Progress Notes (Signed)
Clinical Social Worker (CSW) spoke with pt son Pranish Akhavan over the phone and informed him of PT recommendations for CIR. CSW explored whether son would be agreeable to SNF placement if CIR did not accept pt. Pt son informed CSW that he would prefer that pt either go to CIR or home with [facility]. Pt son states he will speak to the rest of the family regarding SNF placement and the amount of support family able to provide at home. CSW has received consent to do a SNF search as a back-up option.  Theresia Bough, MSW, Theresia Majors [phone]

## 2012-03-20 NOTE — Progress Notes (Signed)
03/20/12 1600  Spiritual Encounters  Spiritual Needs Prayer    Chaplain met pt's companion in the hallway and she asked me to visit pt. (I had served as Media planner during a previous admission.) Pt was sitting up in chair but said he was tired.  Pt's companion said he was also wet. We notified staff and they came to change and put him in bed. Prior to leaving, family asked me to pray for pt. Pt and family were grateful for prayer and spiritual support. Marjory Lies Chaplain

## 2012-03-21 ENCOUNTER — Inpatient Hospital Stay (HOSPITAL_COMMUNITY): Payer: Medicare Other

## 2012-03-21 DIAGNOSIS — I519 Heart disease, unspecified: Secondary | ICD-10-CM

## 2012-03-21 LAB — GLUCOSE, CAPILLARY
Glucose-Capillary: 122 mg/dL — ABNORMAL HIGH (ref 70–99)
Glucose-Capillary: 140 mg/dL — ABNORMAL HIGH (ref 70–99)
Glucose-Capillary: 115 mg/dL — ABNORMAL HIGH (ref 70–99)

## 2012-03-21 LAB — URINE CULTURE: Colony Count: >=100000

## 2012-03-21 LAB — COMPREHENSIVE METABOLIC PANEL
ALT: 17 U/L (ref 0–53)
AST: 45 U/L — ABNORMAL HIGH (ref 0–37)
Albumin: 2.5 g/dL — ABNORMAL LOW (ref 3.5–5.2)
CO2: 22 mEq/L (ref 19–32)
Chloride: 113 mEq/L — ABNORMAL HIGH (ref 96–112)
GFR calc non Af Amer: 57 mL/min — ABNORMAL LOW (ref >90)
Sodium: 146 mEq/L — ABNORMAL HIGH (ref 135–145)
Total Bilirubin: 0.5 mg/dL (ref 0.3–1.2)

## 2012-03-21 LAB — CBC
Hemoglobin: 11.9 g/dL — ABNORMAL LOW (ref 13.0–17.0)
MCH: 26.6 pg (ref 26.0–34.0)
MCHC: 31.6 g/dL (ref 30.0–36.0)
MCV: 84.2 fL (ref 78.0–100.0)
Platelets: 67 10*3/uL — ABNORMAL LOW (ref 150–400)

## 2012-03-21 LAB — INFLUENZA VIRUS AG, A+B (DFA)

## 2012-03-21 LAB — PROTIME-INR
INR: 2.16 — ABNORMAL HIGH (ref 0.00–1.49)
Prothrombin Time: 23.2 seconds — ABNORMAL HIGH (ref 11.6–15.2)

## 2012-03-21 MED ORDER — DILTIAZEM HCL ER COATED BEADS 180 MG PO CP24
180.0000 mg | ORAL_CAPSULE | Freq: Every day | ORAL | Status: DC
  Administered 2012-03-21 – 2012-03-27 (×7): 180 mg via ORAL
  Filled 2012-03-21 (×8): qty 1

## 2012-03-21 MED ORDER — INSULIN ASPART 100 UNIT/ML ~~LOC~~ SOLN
0.0000 [IU] | Freq: Three times a day (TID) | SUBCUTANEOUS | Status: DC
  Administered 2012-03-22 – 2012-03-26 (×4): 2 [IU] via SUBCUTANEOUS

## 2012-03-21 MED ORDER — METOPROLOL TARTRATE 1 MG/ML IV SOLN
5.0000 mg | Freq: Once | INTRAVENOUS | Status: AC
  Administered 2012-03-21: 5 mg via INTRAVENOUS

## 2012-03-21 MED ORDER — TERAZOSIN HCL 2 MG PO CAPS
2.0000 mg | ORAL_CAPSULE | Freq: Every day | ORAL | Status: DC
  Administered 2012-03-21 – 2012-03-26 (×6): 2 mg via ORAL
  Filled 2012-03-21 (×7): qty 1

## 2012-03-21 MED ORDER — DILTIAZEM HCL 100 MG IV SOLR
5.0000 mg/h | INTRAVENOUS | Status: DC
  Administered 2012-03-21: 5 mg/h via INTRAVENOUS
  Filled 2012-03-21: qty 100

## 2012-03-21 MED ORDER — DEXTROSE 5 % IV SOLN
INTRAVENOUS | Status: DC
  Administered 2012-03-21: 14:00:00 via INTRAVENOUS

## 2012-03-21 MED ORDER — STARCH (THICKENING) PO POWD
ORAL | Status: DC | PRN
  Filled 2012-03-21: qty 227

## 2012-03-21 MED ORDER — METOPROLOL TARTRATE 1 MG/ML IV SOLN
5.0000 mg | Freq: Four times a day (QID) | INTRAVENOUS | Status: AC
  Administered 2012-03-21 – 2012-03-22 (×2): 5 mg via INTRAVENOUS

## 2012-03-21 MED ORDER — RESOURCE THICKENUP CLEAR PO POWD
ORAL | Status: DC | PRN
  Filled 2012-03-21: qty 125

## 2012-03-21 MED ORDER — WARFARIN SODIUM 7.5 MG PO TABS
7.5000 mg | ORAL_TABLET | Freq: Once | ORAL | Status: AC
  Administered 2012-03-21: 7.5 mg via ORAL
  Filled 2012-03-21: qty 1

## 2012-03-21 MED ORDER — ENSURE COMPLETE PO LIQD
237.0000 mL | Freq: Three times a day (TID) | ORAL | Status: DC
  Administered 2012-03-21 – 2012-03-27 (×15): 237 mL via ORAL

## 2012-03-21 MED ORDER — ATENOLOL 12.5 MG HALF TABLET
12.5000 mg | ORAL_TABLET | Freq: Every day | ORAL | Status: DC
  Administered 2012-03-23 – 2012-03-27 (×5): 12.5 mg via ORAL
  Filled 2012-03-21 (×6): qty 1

## 2012-03-21 MED ORDER — WARFARIN SODIUM 5 MG IV SOLR
7.5000 mg | Freq: Once | INTRAVENOUS | Status: DC
  Filled 2012-03-21: qty 3.75

## 2012-03-21 MED ORDER — DILTIAZEM HCL ER COATED BEADS 180 MG PO CP24: 180.0000 mg | ORAL_CAPSULE | Freq: Every day | ORAL | Status: DC

## 2012-03-21 NOTE — Progress Notes (Addendum)
ANTICOAGULATION CONSULT NOTE - Follow Up Consult  Pharmacy Consult for Coumadin Indication: atrial fibrillation  No Known Allergies  Patient Measurements: Height: 5\' 8"  (172.7 cm) Weight: 245 lb 6 oz (111.3 kg) IBW/kg (Calculated) : 68.4  Heparin Dosing Weight:   Vital Signs: Temp: 98.8 F (37.1 C) (01/16 0800) Temp src: Oral (01/16 0800) BP: 132/65 mmHg (01/16 0800) Pulse Rate: 95  (01/16 0800)  Labs:  Basename 03/21/12 0450 03/20/12 0500 03/19/12 0500  HGB 11.9* 13.1 --  HCT 37.7* 40.4 40.6  PLT 67* 62* 62*  APTT -- -- --  LABPROT 23.2* 18.6* 16.8*  INR 2.16* 1.61* 1.40  HEPARINUNFRC -- -- --  CREATININE 1.16 1.01 1.09  CKTOTAL -- -- --  CKMB -- -- --  TROPONINI -- -- --    Estimated Creatinine Clearance: 60.5 ml/min (by C-G formula based on Cr of 1.16).  Assessment: 83yom on Coumadin for hx Afib. INR (2.16) is now therapeutic - will resume PTA regimen (7.5mg  daily except 10mg  MWF). No oral intake, MD is beginning to advance diet - will continue IV for now and follow-up toleration. - H/H trended down, Plts stable but low (chronic thrombocytopenia with h/o multiple myeloma) - No significant bleeding reported  Goal of Therapy:  INR 2-3 Monitor platelets by anticoagulation protocol: Yes   Plan:  1. Coumadin 7.5mg  IV x 1 today 2. Follow-up AM INR and oral toleration 3. Ecoli sensitivities reported - consider narrowing based on results  Cleon Dew [phone] 03/21/2012,10:51 AM  Addendum: MD requesting Coumadin to be changed to PO - 7.5mg  po x 1 tonight.  Wilfred Lacy, PharmD Clinical Pharmacist [phone] 03/21/2012, 2:08 PM

## 2012-03-21 NOTE — Progress Notes (Signed)
Stroke Team Progress Note  HISTORY Terry Robertson is an 79 y.o. male with a history of previous left parietal stroke, seizure disorder, hypertension, diabetes mellitus and coronary artery disease, presenting with speech difficulty and reduced movement of his right extremities. Patient arrived 03/16/2012 in code stroke status. He was noted to have markedly reduced speech output as well as eyes deviated to the right side. He was also noted not to be moving his right side, as well. CT scan of his head showed old left parietal stroke. There were no acute findings. While in radiology the patient had a witnessed focal motor seizure involving his right arm right leg and right side of his face. Eyes were deviated tonically to the right side as well. He was given with 2 mg of Ativan. He's been taking Keppra 500 mg twice a day. A loading dose of Keppra 1 g was ordered to be given IV. Patient's seizure activity stopped following administration of initial dose of Ativan. He remained confused, however. Code stroke was canceled. Patient was not a TPA candidate secondary to acute seziure. He was admitted for further evaluation and treatment.  SUBJECTIVE No family is at the bedside.  Overall he feels his condition is stable.   OBJECTIVE Most recent Vital Signs: Filed Vitals:   03/20/12 2200 03/21/12 0013 03/21/12 0429 03/21/12 0747  BP:  124/69 133/65 132/65  Pulse: 101 88 100 95  Temp:  98.1 F (36.7 C) 97.9 F (36.6 C) 98.8 F (37.1 C)  TempSrc:  Oral Oral Oral  Resp: 33 40 31 32  Height:      Weight:      SpO2: 92% 100% 96% 93%   CBG (last 3)   Basename 03/21/12 0746 03/21/12 0400 03/20/12 2343  GLUCAP 108* 119* 122*    IV Fluid Intake:     . dextrose 5 % and 0.45 % NaCl with KCl 40 mEq/L 100 mL/hr at 03/21/12 0017    MEDICATIONS    . cefTRIAXone (ROCEPHIN)  IV  1 g Intravenous Q24H  . insulin aspart  0-15 Units Subcutaneous Q4H  . levetiracetam  1,000 mg Intravenous Q12H  . metoprolol  5 mg  Intravenous Q6H  . Warfarin - Pharmacist Dosing Inpatient   Does not apply q1800   PRN:  sodium chloride, acetaminophen, hydrALAZINE, LORazepam, metoprolol  Diet:  Dysphagia 1 pudding thick liquids Activity:  as tolerated DVT Prophylaxis:  IV warfarin  CLINICALLY SIGNIFICANT STUDIES Basic Metabolic Panel:  Lab 03/21/12 4098 03/20/12 0500  NA 146* 143  K 4.2 3.9  CL 113* 108  CO2 22 21  GLUCOSE 131* 122*  BUN 27* 19  CREATININE 1.16 1.01  CALCIUM 8.7 9.0  MG -- --  PHOS -- --   Liver Function Tests:  Lab 03/21/12 0450 03/20/12 0500  AST 45* 31  ALT 17 11  ALKPHOS 42 43  BILITOT 0.5 0.6  PROT 8.9* 9.5*  ALBUMIN 2.5* 2.8*   CBC:  Lab 03/21/12 0450 03/20/12 0500 03/16/12 1745  WBC 9.9 11.9* --  NEUTROABS -- -- 0.5*  HGB 11.9* 13.1 --  HCT 37.7* 40.4 --  MCV 84.2 85.1 --  PLT 67* 62* --   Coagulation:  Lab 03/21/12 0450 03/20/12 0500 03/19/12 0500 03/16/12 1745  LABPROT 23.2* 18.6* 16.8* 23.9*  INR 2.16* 1.61* 1.40 2.25*   Cardiac Enzymes:  Lab 03/16/12 1846 03/16/12 1745  CKTOTAL 220 --  CKMB -- --  CKMBINDEX -- --  TROPONINI -- <0.30   Urinalysis:  Lab  03/19/12 1213 03/16/12 1858  COLORURINE AMBER* YELLOW  LABSPEC 1.024 1.013  PHURINE 5.5 6.0  GLUCOSEU NEGATIVE NEGATIVE  HGBUR LARGE* SMALL*  BILIRUBINUR SMALL* NEGATIVE  KETONESUR NEGATIVE NEGATIVE  PROTEINUR >300* 100*  UROBILINOGEN 1.0 0.2  NITRITE POSITIVE* NEGATIVE  LEUKOCYTESUR SMALL* NEGATIVE   Lipid Panel    Component Value Date/Time   CHOL 100 08/10/2011 0518   TRIG 170* 12/04/2011 0315   HDL 27* 08/10/2011 0518   CHOLHDL 3.7 08/10/2011 0518   VLDL 21 08/10/2011 0518   LDLCALC 52 08/10/2011 0518   HgbA1C  Lab Results  Component Value Date   HGBA1C 5.7* 03/20/2012    Urine Drug Screen:   No results found for this basename: labopia, cocainscrnur, labbenz, amphetmu, thcu, labbarb    Alcohol Level: No results found for this basename: ETH:2 in the last 168 hours  CT of the brain 03/16/2012     1.  No acute intracranial abnormalities. 2.  Remote left parietal lobe infarct.        MRI of the brain  03/16/2012   1.  Difficult to exclude acute lacunar infarct in the dorsal left thalamus, but this may be artifact as there is no associated new T2 or FLAIR signal abnormality. 2.  No other acute intracranial abnormality.  Chronic posterior left MCA infarct.  Chronic deep gray matter small vessel ischemia.     2D Echocardiogram  08/2011 EF 55-60% with no source of embolus.   Carotid Doppler  08/2011 No evidence of hemodynamically significant internal carotid artery stenosis. Vertebral artery flow is antegrade.   CXR  03/21/2012   No interval change.  Persistent cardiomegaly, basilar atelectasis and pulmonary vascular congestion.   Therapy Recommendations PT - CIR and OT - SNF  Physical Exam  Elderly African American male currently not in distress..Awake alert. Afebrile. Head is nontraumatic. Neck is supple without bruit. Hearing is diminished. Cardiac exam no murmur or gallop. Lungs are clear to auscultation. Distal pulses irregular afib but are well felt.  Neurological Exam :  Awake  Alert oriented x 3. Normal speech and language.eye movements full without nystagmus.fundi not visualized. Vision acuity and fields appear normal. Face symmetric. Tongue midline. Normal strength, tone, reflexes and coordination. Normal sensation. Gait deferred.  ASSESSMENT [name] is a 79 y.o. male presenting with right sided weakness, speech difficulty with his eyes deviated to the right. He had a seizure in the ED. MRI reveals a dorsal left thalamic stroke that is not felt to be associated with his current symptoms, as he has had significant improvement after seizure treatment. Infarct  likely thrombotic secondary to either small vessel disease or embolic given atrial fibrillation. Either way, full workup completed in June 2013 without need to repeat further. Agree with warfarin for secondary stroke prevention as  long as platelets remain stable. Continue current seizure treatment.   Hospital day # 5  TREATMENT/PLAN  Continue warfarin for secondary stroke prevention.  Continue Keppra for seizure management  Agree with plans for CIR/SNF  Stroke Service will sign off.  Annie Main, MSN, RN, ANVP-BC, ANP-BC, [facility] Pager: [phone] 03/21/2012 8:33 AM  I have personally obtained a history, examined the patient, evaluated imaging results, and formulated the assessment and plan of care. I agree with the above.  Delia Heady, MD Medical Director [facility] Pager: [phone] 03/21/2012 5:12 PM

## 2012-03-21 NOTE — Progress Notes (Signed)
Patient's HR has been going into the 130-150 a-fib.  PRN lopressor and po cardizem given and HR still elevated.  MD notified and new orders received.  Will continue to monitor.

## 2012-03-21 NOTE — Progress Notes (Signed)
Speech Language Pathology Dysphagia Treatment Patient Details Name: Terry Robertson MRN: [id number] DOB: Aug 19, 1928 Today's Date: 03/21/2012 Time: 1100-1120 SLP Time Calculation (min): 20 min  Assessment / Plan / Recommendation Clinical Impression  F/u after yesterday's clinical swallow evaluation.  Family present.  Pt more alert and better able to follow commands, answer questions.  Consumed trials of ice chips and thin liquids with improved swallow response; no indication of compromised airway protection.  RR lower today; remained in 20's, occasionally climbing into low 30s but with overall better coordination of swallow/respiratory mechanism.  Continues with slowed response time, slowed mastication, and needs assist with self-feeding.  Recommend advancing to Dysphagia 1, thin liquids.  Meds whole with puree.  Discussed recs/feeding techniques with wife and dtr, who verbalize agreement.      Diet Recommendation  Continue with Current Diet: Dysphagia 1 (puree);Thin liquid    SLP Plan Continue with current plan of care   Pertinent Vitals/Pain No pain   Swallowing Goals  SLP Swallowing Goals Patient will utilize recommended strategies during swallow to increase swallowing safety with: Supervision/safety Swallow Study Goal #2 - Progress: Progressing toward goal  General Temperature Spikes Noted: No Respiratory Status: Supplemental O2 delivered via (comment) Behavior/Cognition: Cooperative;Alert Oral Cavity - Dentition: Adequate natural dentition Patient Positioning: Upright in bed  Oral Cavity - Oral Hygiene Does patient have any of the following "at risk" factors?: Other - dysphagia;Nutritional status - dependent feeder Brush patient's teeth BID with toothbrush (using toothpaste with fluoride): Yes Patient is AT RISK - Oral Care Protocol followed (see row info): Yes   Dysphagia Treatment Treatment focused on: Skilled observation of diet tolerance;Upgraded PO texture  trials;Patient/family/caregiver education Family/Caregiver Educated: dtr/wife Treatment Methods/Modalities: Skilled observation;Differential diagnosis Patient observed directly with PO's: Yes Type of PO's observed: Dysphagia 1 (puree);Thin liquids Feeding: Needs assist Liquids provided via: Cup Oral Phase Signs & Symptoms: Prolonged bolus formation Pharyngeal Phase Signs & Symptoms: Suspected delayed swallow initiation Type of cueing: Verbal Amount of cueing: Minimal   GO    Shequita Peplinski L. Samson Frederic, [place] CCC/SLP Pager [phone]' Carolan Shiver 03/21/2012, 12:03 PM

## 2012-03-21 NOTE — Progress Notes (Signed)
[facility] Progress Note [place] TEAM 1 - Stepdown/ICU TEAM   Terry Robertson NWG:[id number] DOB: 06/14/1928 DOA: 03/16/2012 PCP: Gwynneth Aliment, MD  Brief narrative: 79 y.o. male with history of coronary artery disease, diabetes, hypertension, stroke, seizure with status epilepticus, and history of acute respiratory failure due to status epilepticus requiring intubation in the past who presented with altered mental status on 03/16/12. It was noted that the pt suddenly had difficulty with speech and weakness on the right side. Dr. Vonita Moss with neurology evaluated the patient and noted eyes deviating clonically to the right side with right lower extremity twitching. He was given 2 mg of Ativan in the ER and was loaded on 1 gram of Keppra.  The pt was admitted to the PCCM service due to risk for intubation.  Intubation was ultimately note required.  Neuro did not perform and f/u visits.  The hospital course has been complicated by recurring low grade fevers of unclear origin.    Assessment/Plan:  Recurrent, breakthrough focal seizure activity with focal motor activity / altered mental status  Neuro has not seen since admit consult was completed - Keppra was increased to 1000mg  BID - MRI raised a question of acute lacunar infract in dorsal L thalamus, but was noncommittal to this diagnosis - will ask Neuro to follow up (confirmed they will see him 1/16)  Acute respiratory failure Due to above - did not require intubation - resolved  Fever - UTI- Ecoli Temp to 102 on 1/15 - started on Rocephin- will cont- limit PO meds to necessary meds due to nausea today- will need total 7 days  Dysphagia Ask SLP to eval- on clears with pureed -  Chronic afib- now rate uncontrolled On chronic coumadin tx - tachycardic now in 140 - started back on PO Cardizem- given IV lopressor- will give and extra dose now  DM CBG reasonably controlled at this time - follow w/ SSI  HTN BP not at goal - Cardizem  ordered for today and atenolol re-ordered for tomorrow- currently on IV Lopressor to control HR  Hx of L brain CVA Chronic right sided weekness and slowed speech/aphasia  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 Juliette Alcide - JAK2 mutation is negative - plt count was 84 in Oct - I suspect he has an early gram negative rod bacteremia of a urinary source (most likely E colit) and that this this is to blame for his declining plt count - at present he is not bleeding - will cont his anticoag for now, but if plts drop further may have to consider stopping coumadin and consulting Heme  CAD  Code Status: FULL Disposition Plan: SDU-- CIR  Consultants: Neuro/Stroke Team   Antibiotics: Rocephin 1/15 >>  DVT prophylaxis: Coumadin via IV- changing to PO today  HPI/Subjective: Pt is awake but not very conversant- spoken with daughter and friend in room. Plans to go to CIR when stable.    Objective: Blood pressure 114/73, pulse 86, temperature 98.6 F (37 C), temperature source Oral, resp. rate 37, height 5\' 8"  (1.727 m), weight 111.3 kg (245 lb 6 oz), SpO2 94.00%.  Intake/Output Summary (Last 24 hours) at 03/21/12 1654 Last data filed at 03/21/12 1300  Gross per 24 hour  Intake   2510 ml  Output    975 ml  Net   1535 ml     Exam: General: No acute respiratory distress - alert Lungs: Clear to auscultation  bilaterally w/ exception to mild bibasilar crackles Cardiovascular: tachycardic -IIRR - no appreciable M Abdomen: Nontender, nondistended, soft, bowel sounds positive, no rebound, no ascites, no appreciable mass Extremities: trace B le edema w/o cyanosis or clubbing   Data Reviewed: Basic Metabolic Panel:  Lab 03/21/12 4098 03/20/12 0500 03/19/12 0500 03/17/12 0500 03/16/12 1745  NA 146* 143 140 138 138  K 4.2 3.9 4.1 3.3* 3.6  CL 113* 108 105 102 102  CO2 22 21 24 25 26   GLUCOSE 131* 122* 137* 88  110*  BUN 27* 19 16 8 9   CREATININE 1.16 1.01 1.09 0.87 0.97  CALCIUM 8.7 9.0 8.9 9.0 9.3  MG -- -- -- -- --  PHOS -- -- -- -- --   Liver Function Tests:  Lab 03/21/12 0450 03/20/12 0500 03/16/12 1745  AST 45* 31 24  ALT 17 11 14   ALKPHOS 42 43 51  BILITOT 0.5 0.6 0.3  PROT 8.9* 9.5* 9.7*  ALBUMIN 2.5* 2.8* 3.5   CBC:  Lab 03/21/12 0450 03/20/12 0500 03/19/12 0500 03/17/12 0500 03/16/12 1745  WBC 9.9 11.9* 10.1 3.8* 3.3*  NEUTROABS -- -- -- -- 0.5*  HGB 11.9* 13.1 12.7* 11.3* 12.5*  HCT 37.7* 40.4 40.6 36.5* 39.7  MCV 84.2 85.1 84.2 85.1 84.6  PLT 67* 62* 62* 70* 79*   Cardiac Enzymes:  Lab 03/16/12 1846 03/16/12 1745  CKTOTAL 220 --  CKMB -- --  CKMBINDEX -- --  TROPONINI -- <0.30   CBG:  Lab 03/21/12 1628 03/21/12 1150 03/21/12 0746 03/21/12 0400 03/20/12 2343  GLUCAP 104* 149* 108* 119* 122*    Recent Results (from the past 240 hour(s))  MRSA PCR SCREENING     Status: Normal   Collection Time   03/16/12  9:13 PM      Component Value Range Status Comment   MRSA by PCR NEGATIVE  NEGATIVE Final   CULTURE, BLOOD (ROUTINE X 2)     Status: Normal (Preliminary result)   Collection Time   03/16/12  9:35 PM      Component Value Range Status Comment   Specimen Description BLOOD RIGHT ARM   Final    Special Requests BOTTLES DRAWN AEROBIC ONLY 5CC   Final    Culture  Setup Time 03/17/2012 04:04   Final    Culture     Final    Value:        BLOOD CULTURE RECEIVED NO GROWTH TO DATE CULTURE WILL BE HELD FOR 5 DAYS BEFORE ISSUING A FINAL NEGATIVE REPORT   Report Status PENDING   Incomplete   CULTURE, BLOOD (ROUTINE X 2)     Status: Normal (Preliminary result)   Collection Time   03/16/12  9:50 PM      Component Value Range Status Comment   Specimen Description BLOOD RIGHT HAND   Final    Special Requests BOTTLES DRAWN AEROBIC ONLY 3CC   Final    Culture  Setup Time 03/17/2012 04:04   Final    Culture     Final    Value:        BLOOD CULTURE RECEIVED NO GROWTH TO DATE  CULTURE WILL BE HELD FOR 5 DAYS BEFORE ISSUING A FINAL NEGATIVE REPORT   Report Status PENDING   Incomplete   URINE CULTURE     Status: Normal   Collection Time   03/16/12 10:00 PM      Component Value Range Status Comment   Specimen Description URINE, CATHETERIZED   Final  Special Requests Normal   Final    Culture  Setup Time 03/17/2012 04:01   Final    Colony Count NO GROWTH   Final    Culture NO GROWTH   Final    Report Status 03/18/2012 FINAL   Final   INFLUENZA VIRUS AG, A+B (DFA)     Status: Normal   Collection Time   03/18/12 10:10 AM      Component Value Range Status Comment   Influenza Virus A and B Ag REPORT   Final   CULTURE, BLOOD (ROUTINE X 2)     Status: Normal (Preliminary result)   Collection Time   03/19/12 12:10 PM      Component Value Range Status Comment   Specimen Description BLOOD HAND RIGHT   Final    Special Requests BOTTLES DRAWN AEROBIC AND ANAEROBIC 10CC EACH   Final    Culture  Setup Time 03/19/2012 19:22   Final    Culture     Final    Value:        BLOOD CULTURE RECEIVED NO GROWTH TO DATE CULTURE WILL BE HELD FOR 5 DAYS BEFORE ISSUING A FINAL NEGATIVE REPORT   Report Status PENDING   Incomplete   URINE CULTURE     Status: Normal   Collection Time   03/19/12 12:13 PM      Component Value Range Status Comment   Specimen Description URINE, CATHETERIZED   Final    Special Requests NONE   Final    Culture  Setup Time 03/19/2012 14:03   Final    Colony Count >=100,000 COLONIES/ML   Final    Culture ESCHERICHIA COLI   Final    Report Status 03/21/2012 FINAL   Final    Organism ID, Bacteria ESCHERICHIA COLI   Final   CULTURE, BLOOD (ROUTINE X 2)     Status: Normal (Preliminary result)   Collection Time   03/19/12 12:18 PM      Component Value Range Status Comment   Specimen Description BLOOD HAND LEFT   Final    Special Requests BOTTLES DRAWN AEROBIC AND ANAEROBIC 10CC EACH   Final    Culture  Setup Time 03/19/2012 19:22   Final    Culture     Final      Value:        BLOOD CULTURE RECEIVED NO GROWTH TO DATE CULTURE WILL BE HELD FOR 5 DAYS BEFORE ISSUING A FINAL NEGATIVE REPORT   Report Status PENDING   Incomplete      Studies:  Recent x-ray studies have been reviewed in detail by the Attending Physician  Scheduled Meds:  Reviewed in detail by the Attending Physician   Calvert Cantor, MD [phone] If 7PM-7AM, please contact night-coverage [email or web address] Password [email or web address] 03/21/2012, 4:54 PM   LOS: 5 days

## 2012-03-21 NOTE — Progress Notes (Signed)
HR still elevated and new orders received for cardizem gtt to be started.  Gtt started at 5 ml/hr and verified by dawn fields, RN.  Will continue to monitor.

## 2012-03-21 NOTE — Progress Notes (Signed)
NUTRITION FOLLOW UP  Intervention:   Chocolate Ensure Complete PO TID, each supplement provides 350 kcal and 13 grams of protein.  Nutrition Dx:   Inadequate oral intake related to swallowing difficulty as evidenced by diet just advanced today.  Goal:   Intake to meet >90% of estimated nutrition needs.  Monitor:   PO intake, labs, weight trend, supplement tolerance.  Assessment:   SLP following for diet advancement.  Patient was allowed pureed foods but no liquids for breakfast today.  Diet advanced to Dysphagia 1 with thin liquids starting with lunch today. Patient says that his appetite is good, but family is not sure whether or not he will like the pureed foods.  Height: Ht Readings from Last 1 Encounters:  03/16/12 5\' 8"  (1.727 m)    Weight Status:   Wt Readings from Last 1 Encounters:  03/20/12 245 lb 6 oz (111.3 kg)  1/14 116.6 kg  Re-estimated needs:  Kcal: 1900-2100  Protein: 110-120 gm  Fluid: ~ 2 L  Skin: no problems noted  Diet Order: Dysphagia 1 with thin liquids   Intake/Output Summary (Last 24 hours) at 03/21/12 1152 Last data filed at 03/21/12 0800  Gross per 24 hour  Intake   2020 ml  Output   1001 ml  Net   1019 ml    Last BM: 1/15   Labs:   Lab 03/21/12 0450 03/20/12 0500 03/19/12 0500  NA 146* 143 140  K 4.2 3.9 4.1  CL 113* 108 105  CO2 22 21 24   BUN 27* 19 16  CREATININE 1.16 1.01 1.09  CALCIUM 8.7 9.0 8.9  MG -- -- --  PHOS -- -- --  GLUCOSE 131* 122* 137*    CBG (last 3)   Basename 03/21/12 0746 03/21/12 0400 03/20/12 2343  GLUCAP 108* 119* 122*    Scheduled Meds:   . cefTRIAXone (ROCEPHIN)  IV  1 g Intravenous Q24H  . feeding supplement  237 mL Oral TID BM  . insulin aspart  0-15 Units Subcutaneous Q4H  . levetiracetam  1,000 mg Intravenous Q12H  . metoprolol  5 mg Intravenous Q6H  . Warfarin - Pharmacist Dosing Inpatient   Does not apply q1800  . warfarin IV  7.5 mg Intravenous ONCE-1800    Continuous Infusions:    . dextrose 5 % and 0.45 % NaCl with KCl 40 mEq/L 100 mL/hr at 03/21/12 0017    Joaquin Courts, RD, LDN, CNSC Pager# [phone] After Hours Pager# [phone]

## 2012-03-22 DIAGNOSIS — J811 Chronic pulmonary edema: Secondary | ICD-10-CM | POA: Diagnosis present

## 2012-03-22 LAB — BASIC METABOLIC PANEL
CO2: 20 mEq/L (ref 19–32)
Calcium: 8.4 mg/dL (ref 8.4–10.5)
GFR calc non Af Amer: 75 mL/min — ABNORMAL LOW (ref >90)
Glucose, Bld: 126 mg/dL — ABNORMAL HIGH (ref 70–99)
Potassium: 3.7 mEq/L (ref 3.5–5.1)
Sodium: 141 mEq/L (ref 135–145)

## 2012-03-22 LAB — GLUCOSE, CAPILLARY
Glucose-Capillary: 120 mg/dL — ABNORMAL HIGH (ref 70–99)
Glucose-Capillary: 150 mg/dL — ABNORMAL HIGH (ref 70–99)
Glucose-Capillary: 124 mg/dL — ABNORMAL HIGH (ref 70–99)

## 2012-03-22 LAB — PROTIME-INR: INR: 2.67 — ABNORMAL HIGH (ref 0.00–1.49)

## 2012-03-22 MED ORDER — FUROSEMIDE 10 MG/ML IJ SOLN
40.0000 mg | Freq: Two times a day (BID) | INTRAMUSCULAR | Status: AC
  Administered 2012-03-22 – 2012-03-23 (×3): 40 mg via INTRAVENOUS
  Filled 2012-03-22 (×3): qty 4

## 2012-03-22 MED ORDER — ASPIRIN EC 81 MG PO TBEC
81.0000 mg | DELAYED_RELEASE_TABLET | Freq: Every day | ORAL | Status: DC
  Administered 2012-03-22 – 2012-03-27 (×6): 81 mg via ORAL
  Filled 2012-03-22 (×6): qty 1

## 2012-03-22 MED ORDER — WARFARIN SODIUM 2.5 MG PO TABS
2.5000 mg | ORAL_TABLET | Freq: Once | ORAL | Status: AC
  Administered 2012-03-22: 2.5 mg via ORAL
  Filled 2012-03-22: qty 1

## 2012-03-22 MED ORDER — OMEGA-3-ACID ETHYL ESTERS 1 G PO CAPS
2.0000 g | ORAL_CAPSULE | Freq: Two times a day (BID) | ORAL | Status: DC
  Administered 2012-03-22 – 2012-03-27 (×11): 2 g via ORAL
  Filled 2012-03-22 (×12): qty 2

## 2012-03-22 MED ORDER — ATENOLOL 12.5 MG HALF TABLET
12.5000 mg | ORAL_TABLET | ORAL | Status: AC
  Administered 2012-03-22: 12.5 mg via ORAL
  Filled 2012-03-22: qty 1

## 2012-03-22 NOTE — Progress Notes (Signed)
Rehab admissions - Evaluated for possible admission.  I spoke with patient and his girlfriend.  They would like inpatient rehab admission.  Patient is alert and he participated with PT today.  I will open the case with [employer] and will begin authorization process.  I will not have an answer until Monday at the earliest.  Will update all as soon as I hear back from the insurance carrier.  Call me for questions.  #[phone]

## 2012-03-22 NOTE — Progress Notes (Signed)
Physical Therapy Treatment Patient Details Name: Terry Robertson MRN: [id number] DOB: 1928-10-08 Today's Date: 03/22/2012 Time: 1191-4782 PT Time Calculation (min): 23 min  PT Assessment / Plan / Recommendation Comments on Treatment Session  Pt more alert this session with improved overall speech however occasional perservating on word "Annie Paras"  However pt able to improve overall communication but continues to motor planning deficits with problem solving impairments with all mobility.  Pt making great progress and able to increase overall mobility and ability to tolerate therapy.  Will continue to follow.    Follow Up Recommendations  CIR     Equipment Recommendations  None recommended by PT    Recommendations for Other Services Rehab consult  Frequency Min 3X/week   Plan Discharge plan remains appropriate;Frequency remains appropriate    Precautions / Restrictions Precautions Precautions: Fall Restrictions Weight Bearing Restrictions: No   Pertinent Vitals/Pain No c/o pain    Mobility  Bed Mobility Bed Mobility: Rolling Right;Right Sidelying to Sit Rolling Right: 3: Mod assist;With rail Right Sidelying to Sit: 3: Mod assist;With rails Details for Bed Mobility Assistance: (A) to initiate and complete roll and (A) to elevate trunk OOB. Max cues for hand placement and proper technique.  Pt needs extra time to complete task. Transfers Transfers: Sit to Stand;Stand to Sit;Stand Pivot Transfers Sit to Stand: 1: +2 Total assist;From bed Sit to Stand: Patient Percentage: 60% Stand to Sit: 1: +2 Total assist;To chair/3-in-1 Stand to Sit: Patient Percentage: 60% Details for Transfer Assistance: +2 (A) to initiate transfer and slowly descend to recliner with max cues for LE and UE placement.   Ambulation/Gait Ambulation/Gait Assistance: 1: +2 Total assist Ambulation/Gait: Patient Percentage: 70% Ambulation Distance (Feet): 6 Feet Assistive device: Rolling walker Ambulation/Gait  Assistance Details: +2 (A) for safety and chair to follow due to overall weakness.  Pt need max cues for RW management and proper body position within RW.  Pt with motor planning deficts with LE placement.  Needs extra time and visual cues to complete step sequence. Gait Pattern: Step-to pattern;Decreased step length - left;Decreased stance time - right;Shuffle;Trunk flexed Stairs: No Wheelchair Mobility Wheelchair Mobility: No Modified Rankin (Stroke Patients Only) Pre-Morbid Rankin Score: No significant disability Modified Rankin: Moderately severe disability    Exercises     PT Diagnosis:    PT Problem List:   PT Treatment Interventions:     PT Goals Acute Rehab PT Goals PT Goal Formulation: Patient unable to participate in goal setting Time For Goal Achievement: 04/02/12 Potential to Achieve Goals: Good Pt will go Supine/Side to Sit: with min assist PT Goal: Supine/Side to Sit - Progress: Progressing toward goal Pt will Sit at Edge of Bed: with supervision;1-2 min PT Goal: Sit at Edge Of Bed - Progress: Progressing toward goal Pt will go Sit to Supine/Side: with min assist PT Goal: Sit to Supine/Side - Progress: Progressing toward goal Pt will go Sit to Stand: with min assist PT Goal: Sit to Stand - Progress: Progressing toward goal Pt will go Stand to Sit: with min assist PT Goal: Stand to Sit - Progress: Progressing toward goal Pt will Transfer Bed to Chair/Chair to Bed: with min assist PT Transfer Goal: Bed to Chair/Chair to Bed - Progress: Progressing toward goal Pt will Stand: with mod assist;1 - 2 min PT Goal: Stand - Progress: Progressing toward goal Pt will Ambulate: 1 - 15 feet;with +2 total assist PT Goal: Ambulate - Progress: Progressing toward goal  Visit Information  Last PT Received On: 03/22/12 Assistance  Needed: +2    Subjective Data  Patient Stated Goal: Pt did not set   Cognition  Overall Cognitive Status: Impaired Area of Impairment:  Memory;Following commands;Safety/judgement;Problem solving Arousal/Alertness: Awake/alert Orientation Level: Disoriented to;Place;Time (Pt unable to recall place.  Pt able recall month but not yea) Behavior During Session: Flat affect Current Attention Level: Selective Following Commands: Follows multi-step commands with increased time Problem Solving: Pt slow to process and question motor planning deficits.    Balance  Balance Balance Assessed: Yes Static Sitting Balance Static Sitting - Balance Support: Feet supported;No upper extremity supported Static Sitting - Level of Assistance: 5: Stand by assistance Static Standing Balance Static Standing - Balance Support: Bilateral upper extremity supported Static Standing - Level of Assistance: 4: Min assist Static Standing - Comment/# of Minutes: Initial (A) to maintain balance with cues for upright posture   End of Session PT - End of Session Equipment Utilized During Treatment: Gait belt Activity Tolerance: Patient limited by fatigue Patient left: in chair;with call bell/phone within reach;with family/visitor present Nurse Communication: Mobility status   GP     Sina Sumpter 03/22/2012, 11:45 AM Jake Shark, PT DPT [phone]

## 2012-03-22 NOTE — Progress Notes (Signed)
ANTICOAGULATION CONSULT NOTE - Follow Up Consult  Pharmacy Consult for coumadin Indication: atrial fibrillation  No Known Allergies  Patient Measurements: Height: 5\' 8"  (172.7 cm) Weight: 245 lb 6 oz (111.3 kg) IBW/kg (Calculated) : 68.4    Vital Signs: Temp: 98.8 F (37.1 C) (01/17 0700) Temp src: Axillary (01/17 0700) BP: 104/70 mmHg (01/17 0700) Pulse Rate: 67  (01/17 0500)  Labs:  Basename 03/22/12 0450 03/21/12 0450 03/20/12 0500  HGB -- 11.9* 13.1  HCT -- 37.7* 40.4  PLT -- 67* 62*  APTT -- -- --  LABPROT 27.1* 23.2* 18.6*  INR 2.67* 2.16* 1.61*  HEPARINUNFRC -- -- --  CREATININE 0.99 1.16 1.01  CKTOTAL -- -- --  CKMB -- -- --  TROPONINI -- -- --    Estimated Creatinine Clearance: 70.9 ml/min (by C-G formula based on Cr of 0.99).  Assessment: Patient is an 79 y.o M on coumadin for afib. INR is therapeutic but continues to rise quickly from 2.16 to 2.67 today. This may be d/t poor oral intake. No bleeding noted.     Goal of Therapy:  INR 2-3    Plan:  1) coumadin 2.5mg  PO x1 today  Dotti Busey P 03/22/2012,10:58 AM

## 2012-03-22 NOTE — Progress Notes (Signed)
Clinical Child psychotherapist (CSW) informed pt significant other Ms. Terry Robertson that Terry Robertson is unable to offer placement. CSW has provided bed offers. Ms. Terry Robertson and pt son Terry Robertson to discuss offers and inform CSW of decision.  Theresia Bough, MSW, Theresia Majors [phone]

## 2012-03-22 NOTE — Progress Notes (Signed)
Utilization review completed.  

## 2012-03-22 NOTE — Care Management Note (Signed)
    Page 1 of 1   03/22/2012     11:49:56 AM   CARE MANAGEMENT NOTE 03/22/2012  Patient:  Terry Robertson, Terry Robertson   Account Number:  [id number]  Date Initiated:  03/20/2012  Documentation initiated by:  Donn Pierini  Subjective/Objective Assessment:   Pt admitted with seizure and prolonged AMS, tenuous airway/respiratory status     Action/Plan:   PTA pt lived at home with spouse- PT/OT/ST evals- CIR consult   Anticipated DC Date:  03/22/2012   Anticipated DC Plan:    In-house referral  Clinical Social Worker         Choice offered to / List presented to:             Status of service:  In process, will continue to follow Medicare Important Message given?   (If response is "NO", the following Medicare IM given date fields will be blank) Date Medicare IM given:   Date Additional Medicare IM given:    Discharge Disposition:    Per UR Regulation:  Reviewed for med. necessity/level of care/duration of stay  If discussed at Long Length of Stay Meetings, dates discussed:    Comments:  03/22/12- 1130- Donn Pierini RN, BSN [phone] CIR consult completed at this time they are recommending ST-SNF placement for rehab unless pt makes significant improvement in ability to participate in therapies. per Genie with CIR- she will check in on Monday 03/25/12 on pt's progress- but at this time feels like SNF a more appropriate option- CSW to f/u with ST-SNF as a back up option and give family bed offers today.  03/20/12- 1120- Donn Pierini RN, BSN [phone] consulted CIR today - d/c planning cir vs SNF- will await CIR consult- NCM to continue to follow

## 2012-03-22 NOTE — Progress Notes (Signed)
[facility] Progress Note [place] TEAM 1 - Stepdown/ICU TEAM   Sahir Tolson QMV:[id number] DOB: 01-19-1929 DOA: 03/16/2012 PCP: Gwynneth Aliment, MD  Brief narrative: 79 y.o. male with history of coronary artery disease, diabetes, hypertension, stroke, seizure with status epilepticus, and history of acute respiratory failure due to status epilepticus requiring intubation in the past who presented with altered mental status on 03/16/12. It was noted that the pt suddenly had difficulty with speech and weakness on the right side. Dr. Vonita Moss with neurology evaluated the patient and noted eyes deviating clonically to the right side with right lower extremity twitching. He was given 2 mg of Ativan in the ER and was loaded on 1 gram of Keppra.  The pt was admitted to the PCCM service due to risk for intubation.  Intubation was ultimately note required.  Neuro did not perform and f/u visits.  The hospital course has been complicated by recurring low grade fevers of unclear origin.    Assessment/Plan:  Recurrent, breakthrough focal seizure activity with focal motor activity / altered mental status  Neuro has not seen since admit consult was completed - Keppra was increased to 1000mg  BID - MRI raised a question of acute lacunar infract in dorsal L thalamus, but was noncommittal to this diagnosis -neuro does not feel there is an acute infarct- Dr Pearlean Brownie commented that his seizures are likely a result of the prior infarct.   Acute respiratory failure Due to above - did not require intubation - Now becoming tachypneic- likely pulm edema exacerbated from RVR yesterday- will start IV Lasix BID and order 3 doses- will need to be re-assessed tomorrow before ordering more.   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 Juliette Alcide - JAK2 mutation is negative - plt count was 84 in Oct - I suspect he has an early gram negative rod bacteremia of a urinary source (most likely E colit) and that this this is to blame for his declining plt count - at present he is not bleeding - will cont his anticoag for now, but if plts drop further may have to consider stopping coumadin and consulting Heme  CAD On ASA- resuming today  Deconditioning Attempting to try for CIR- pt and his family do not want him to go to SNF but if CIR denies him or if his insurance does not pay for CIR, he will need to go to SNF- pt just wants to go home but is clearly too weak.   Code Status: FULL Disposition Plan: SDU-- CIR  Consultants: Neuro/Stroke Team   Antibiotics: Rocephin 1/15 >>  DVT prophylaxis: Coumadin via IV- changing to PO today  HPI/Subjective: Pt is awake- sitting in a chair- too weak to even lean forward- no c/o dyspnea but breathing about 30 breaths per min- no  palpitations- HR in 100s in AM but down to 70s by afternoon   Objective: Blood pressure 123/62, pulse 91, temperature 98 F (36.7 C), temperature source Oral, resp. rate 29, height 5\' 8"  (1.727 m), weight 111.3 kg (245 lb 6 oz), SpO2 95.00%.  Intake/Output Summary (Last 24 hours) at 03/22/12 1425 Last data filed at 03/22/12 1326  Gross per 24 hour  Intake   2185 ml  Output    775 ml  Net   1410 ml     Exam: General: No acute respiratory distress - alert Lungs: unable  to listen from back today- poor breath sounds when listened from front Cardiovascular: tachycardic in low 100's in AM and then in 70s by afternoon -IIRR - no appreciable M Abdomen: Nontender, nondistended, soft, bowel sounds positive, no rebound, no ascites, no appreciable mass Extremities: trace B le edema w/o cyanosis or clubbing   Data Reviewed: Basic Metabolic Panel:  Lab 03/22/12 1610 03/21/12 0450 03/20/12 0500 03/19/12 0500 03/17/12 0500  NA 141 146* 143 140 138  K 3.7 4.2 3.9 4.1 3.3*  CL 109 113* 108 105 102  CO2 20 22 21 24 25   GLUCOSE 126* 131* 122* 137* 88  BUN 23 27* 19 16 8   CREATININE 0.99 1.16 1.01 1.09 0.87  CALCIUM 8.4 8.7 9.0 8.9 9.0  MG -- -- -- -- --  PHOS -- -- -- -- --   Liver Function Tests:  Lab 03/21/12 0450 03/20/12 0500 03/16/12 1745  AST 45* 31 24  ALT 17 11 14   ALKPHOS 42 43 51  BILITOT 0.5 0.6 0.3  PROT 8.9* 9.5* 9.7*  ALBUMIN 2.5* 2.8* 3.5   CBC:  Lab 03/21/12 0450 03/20/12 0500 03/19/12 0500 03/17/12 0500 03/16/12 1745  WBC 9.9 11.9* 10.1 3.8* 3.3*  NEUTROABS -- -- -- -- 0.5*  HGB 11.9* 13.1 12.7* 11.3* 12.5*  HCT 37.7* 40.4 40.6 36.5* 39.7  MCV 84.2 85.1 84.2 85.1 84.6  PLT 67* 62* 62* 70* 79*   Cardiac Enzymes:  Lab 03/16/12 1846 03/16/12 1745  CKTOTAL 220 --  CKMB -- --  CKMBINDEX -- --  TROPONINI -- <0.30   CBG:  Lab 03/22/12 1250 03/22/12 0729 03/22/12 0332 03/21/12 2352 03/21/12 2023  GLUCAP 150* 120* 118* 103* 115*    Recent Results (from the past 240 hour(s))  MRSA PCR SCREENING     Status: Normal   Collection Time   03/16/12  9:13 PM      Component Value Range Status Comment   MRSA by PCR NEGATIVE  NEGATIVE Final   CULTURE, BLOOD (ROUTINE X 2)     Status: Normal (Preliminary result)   Collection Time   03/16/12  9:35 PM      Component Value Range Status Comment   Specimen Description BLOOD RIGHT ARM   Final    Special Requests BOTTLES DRAWN AEROBIC ONLY 5CC   Final    Culture  Setup Time 03/17/2012 04:04   Final     Culture     Final    Value:        BLOOD CULTURE RECEIVED NO GROWTH TO DATE CULTURE WILL BE HELD FOR 5 DAYS BEFORE ISSUING A FINAL NEGATIVE REPORT   Report Status PENDING   Incomplete   CULTURE, BLOOD (ROUTINE X 2)     Status: Normal (Preliminary result)   Collection Time   03/16/12  9:50 PM      Component Value Range Status Comment   Specimen Description BLOOD RIGHT HAND  Final    Special Requests BOTTLES DRAWN AEROBIC ONLY 3CC   Final    Culture  Setup Time 03/17/2012 04:04   Final    Culture     Final    Value:        BLOOD CULTURE RECEIVED NO GROWTH TO DATE CULTURE WILL BE HELD FOR 5 DAYS BEFORE ISSUING A FINAL NEGATIVE REPORT   Report Status PENDING   Incomplete   URINE CULTURE     Status: Normal   Collection Time   03/16/12 10:00 PM      Component Value Range Status Comment   Specimen Description URINE, CATHETERIZED   Final    Special Requests Normal   Final    Culture  Setup Time 03/17/2012 04:01   Final    Colony Count NO GROWTH   Final    Culture NO GROWTH   Final    Report Status 03/18/2012 FINAL   Final   INFLUENZA VIRUS AG, A+B (DFA)     Status: Normal   Collection Time   03/18/12 10:10 AM      Component Value Range Status Comment   Influenza Virus A and B Ag REPORT   Final   CULTURE, BLOOD (ROUTINE X 2)     Status: Normal (Preliminary result)   Collection Time   03/19/12 12:10 PM      Component Value Range Status Comment   Specimen Description BLOOD HAND RIGHT   Final    Special Requests BOTTLES DRAWN AEROBIC AND ANAEROBIC 10CC EACH   Final    Culture  Setup Time 03/19/2012 19:22   Final    Culture     Final    Value:        BLOOD CULTURE RECEIVED NO GROWTH TO DATE CULTURE WILL BE HELD FOR 5 DAYS BEFORE ISSUING A FINAL NEGATIVE REPORT   Report Status PENDING   Incomplete   URINE CULTURE     Status: Normal   Collection Time   03/19/12 12:13 PM      Component Value Range Status Comment   Specimen Description URINE, CATHETERIZED   Final    Special Requests NONE    Final    Culture  Setup Time 03/19/2012 14:03   Final    Colony Count >=100,000 COLONIES/ML   Final    Culture ESCHERICHIA COLI   Final    Report Status 03/21/2012 FINAL   Final    Organism ID, Bacteria ESCHERICHIA COLI   Final   CULTURE, BLOOD (ROUTINE X 2)     Status: Normal (Preliminary result)   Collection Time   03/19/12 12:18 PM      Component Value Range Status Comment   Specimen Description BLOOD HAND LEFT   Final    Special Requests BOTTLES DRAWN AEROBIC AND ANAEROBIC 10CC EACH   Final    Culture  Setup Time 03/19/2012 19:22   Final    Culture     Final    Value:        BLOOD CULTURE RECEIVED NO GROWTH TO DATE CULTURE WILL BE HELD FOR 5 DAYS BEFORE ISSUING A FINAL NEGATIVE REPORT   Report Status PENDING   Incomplete      Studies:  Recent x-ray studies have been reviewed in detail by the Attending Physician  Scheduled Meds:  Reviewed in detail by the Attending Physician   Calvert Cantor, MD [phone] If 7PM-7AM, please contact night-coverage [email or web address] Password [email or web address] 03/22/2012, 2:25 PM   LOS: 6 days

## 2012-03-23 LAB — BASIC METABOLIC PANEL
BUN: 24 mg/dL — ABNORMAL HIGH (ref 6–23)
Calcium: 8.5 mg/dL (ref 8.4–10.5)
Creatinine, Ser: 0.92 mg/dL (ref 0.50–1.35)
GFR calc Af Amer: 89 mL/min — ABNORMAL LOW (ref >90)
GFR calc non Af Amer: 77 mL/min — ABNORMAL LOW (ref >90)
Glucose, Bld: 100 mg/dL — ABNORMAL HIGH (ref 70–99)

## 2012-03-23 LAB — GLUCOSE, CAPILLARY: Glucose-Capillary: 90 mg/dL (ref 70–99)

## 2012-03-23 LAB — CULTURE, BLOOD (ROUTINE X 2)
Culture: NO GROWTH
Culture: NO GROWTH

## 2012-03-23 LAB — PROTIME-INR: INR: 3.35 — ABNORMAL HIGH (ref 0.00–1.49)

## 2012-03-23 NOTE — Progress Notes (Signed)
  ANTICOAGULATION CONSULT NOTE - Follow Up Consult  Pharmacy Consult for Coumadin Indication: atrial fibrillation  No Known Allergies  Patient Measurements: Height: 5\' 8"  (172.7 cm) Weight: 245 lb 6 oz (111.3 kg) IBW/kg (Calculated) : 68.4  Heparin Dosing Weight:   Vital Signs: Temp: 97.6 F (36.4 C) (01/18 0700) Temp src: Oral (01/18 0700) BP: 118/62 mmHg (01/18 0800) Pulse Rate: 96  (01/18 0617)  Labs:  Basename 03/23/12 0530 03/22/12 0450 03/21/12 0450  HGB -- -- 11.9*  HCT -- -- 37.7*  PLT -- -- 67*  APTT -- -- --  LABPROT 32.1* 27.1* 23.2*  INR 3.35* 2.67* 2.16*  HEPARINUNFRC -- -- --  CREATININE -- 0.99 1.16  CKTOTAL -- -- --  CKMB -- -- --  TROPONINI -- -- --    Estimated Creatinine Clearance: 70.9 ml/min (by C-G formula based on Cr of 0.99).  Assessment: 83yom on Coumadin for hx Afib. INR (3.35) is supratherapeutic after continuing to increase despite reduced Coumadin dose. Will plan to hold Coumadin tonight and allow INR to drift back into range. - No CBC this AM - No significant bleeding reported  Goal of Therapy:  INR 2-3   Plan:  1. No Coumadin tonight 2. Follow-up AM INR  Cleon Dew [phone] 03/23/2012,10:19 AM

## 2012-03-23 NOTE — Progress Notes (Signed)
[facility] Progress Note [place] TEAM 1 - Stepdown/ICU TEAM   Cuyler Vandyken OZH:[id number] DOB: 05/04/1928 DOA: 03/16/2012 PCP: Gwynneth Aliment, MD  Brief narrative: 79 y.o. male with history of coronary artery disease, diabetes, hypertension, stroke, seizure with status epilepticus, and history of acute respiratory failure due to status epilepticus requiring intubation in the past who presented with altered mental status on 03/16/12. It was noted that the pt suddenly had difficulty with speech and weakness on the right side. Dr. Vonita Moss with neurology evaluated the patient and noted eyes deviating clonically to the right side with right lower extremity twitching. He was given 2 mg of Ativan in the ER and was loaded on 1 gram of Keppra.  The pt was admitted to the PCCM service due to risk for intubation.  Intubation was ultimately note required.  Neuro did not perform and f/u visits.  The hospital course has been complicated by recurring low grade fevers- possibly from UTI.  Pt developed a-fib on 1/16- this was controlled as detailed below-  Also developed pulm edema due to RVR He is now awaiting response in regards to DIR admission  Assessment/Plan:  Recurrent, breakthrough focal seizure activity with focal motor activity / altered mental status  Neuro has not seen since admit consult was completed - Keppra was increased to 1000mg  BID - MRI raised a question of acute lacunar infract in dorsal L thalamus, but was noncommittal to this diagnosis -neuro does not feel there is an acute infarct- Dr Pearlean Brownie commented that his seizures are likely a result of the prior infarct.   Acute respiratory failure Due to above - did not require intubation - Tachypnea from yesterday improved- likely pulm edema exacerbated from RVR - given IV Lasix BID and order 3 doses- last dose was this evening- will hold off on ordering more as lungs are clear.   Fever - UTI- Ecoli Temp to 102 on 1/15 - started on  Rocephin- switched to Cipro  will need total 7 days  Dysphagia Asked SLP to eval- placed on clears with pureed - tolerating well  Chronic afib-  Rate went up to 150s - temporarily required cardizem infusion on 1/17 - we resumed his Atenolol (at 25 mg), PO Cardizem Rate better controlled now On chronic coumadin tx  DM CBG reasonably controlled at this time - follow w/ SSI  HTN BP better controlled today  Hx of L brain CVA Chronic right sided weekness and slowed speech/aphasia  Hx of MDS manifesting predominantly with mild anemia and neurtropenia - acute on chronic thrombocytopenia Followed by Dr. Juliette Alcide - JAK2 mutation is negative - plt count was 84 in Oct - I suspect he has an early gram negative rod bacteremia of a urinary source (most likely E colit) and that this this is to blame for his declining plt count - at present he is not bleeding - will cont his anticoag for now, but if plts drop further may have to consider stopping coumadin and consulting Heme  CAD On ASA- resuming today  Deconditioning Attempting to try for CIR- pt and his family do not want him to go to SNF but if CIR denies him or if his insurance does not pay for CIR, he will need to go to SNF- pt just wants to go home but is clearly too weak.   Code Status: FULL Disposition Plan: SDU-- CIR  Consultants: Neuro/Stroke Team   Antibiotics: Rocephin 1/15 >>  DVT prophylaxis: Coumadin   HPI/Subjective: Pt is awake- sitting  in a chair- appears stronger than yesterday- no complaints. No dyspnea/ cough/ nausea or constipation   Objective: Blood pressure 122/58, pulse 72, temperature 98.4 F (36.9 C), temperature source Oral, resp. rate 20, height 5\' 5"  (1.651 m), weight 111.131 kg (245 lb), SpO2 93.00%.  Intake/Output Summary (Last 24 hours) at 03/23/12 1751 Last data filed at 03/23/12 1246  Gross per 24 hour  Intake    840 ml  Output   1475 ml  Net   -635 ml     Exam: General: No acute  respiratory distress - alert Lungs: CTA b/l  Cardiovascular:IIRR - no appreciable M Abdomen: Nontender, nondistended, soft, bowel sounds positive, no rebound, no ascites, no appreciable mass Extremities: trace B le edema w/o cyanosis or clubbing   Data Reviewed: Basic Metabolic Panel:  Lab 03/23/12 0865 03/22/12 0450 03/21/12 0450 03/20/12 0500 03/19/12 0500  NA 138 141 146* 143 140  K 3.6 3.7 4.2 3.9 4.1  CL 103 109 113* 108 105  CO2 23 20 22 21 24   GLUCOSE 100* 126* 131* 122* 137*  BUN 24* 23 27* 19 16  CREATININE 0.92 0.99 1.16 1.01 1.09  CALCIUM 8.5 8.4 8.7 9.0 8.9  MG -- -- -- -- --  PHOS -- -- -- -- --   Liver Function Tests:  Lab 03/21/12 0450 03/20/12 0500  AST 45* 31  ALT 17 11  ALKPHOS 42 43  BILITOT 0.5 0.6  PROT 8.9* 9.5*  ALBUMIN 2.5* 2.8*   CBC:  Lab 03/21/12 0450 03/20/12 0500 03/19/12 0500 03/17/12 0500  WBC 9.9 11.9* 10.1 3.8*  NEUTROABS -- -- -- --  HGB 11.9* 13.1 12.7* 11.3*  HCT 37.7* 40.4 40.6 36.5*  MCV 84.2 85.1 84.2 85.1  PLT 67* 62* 62* 70*   Cardiac Enzymes:  Lab 03/16/12 1846  CKTOTAL 220  CKMB --  CKMBINDEX --  TROPONINI --   CBG:  Lab 03/23/12 1634 03/23/12 1154 03/23/12 0732 03/23/12 0413 03/23/12 0036  GLUCAP 114* 113* 112* 112* 131*    Recent Results (from the past 240 hour(s))  MRSA PCR SCREENING     Status: Normal   Collection Time   03/16/12  9:13 PM      Component Value Range Status Comment   MRSA by PCR NEGATIVE  NEGATIVE Final   CULTURE, BLOOD (ROUTINE X 2)     Status: Normal   Collection Time   03/16/12  9:35 PM      Component Value Range Status Comment   Specimen Description BLOOD RIGHT ARM   Final    Special Requests BOTTLES DRAWN AEROBIC ONLY 5CC   Final    Culture  Setup Time 03/17/2012 04:04   Final    Culture NO GROWTH 5 DAYS   Final    Report Status 03/23/2012 FINAL   Final   CULTURE, BLOOD (ROUTINE X 2)     Status: Normal   Collection Time   03/16/12  9:50 PM      Component Value Range Status Comment     Specimen Description BLOOD RIGHT HAND   Final    Special Requests BOTTLES DRAWN AEROBIC ONLY 3CC   Final    Culture  Setup Time 03/17/2012 04:04   Final    Culture NO GROWTH 5 DAYS   Final    Report Status 03/23/2012 FINAL   Final   URINE CULTURE     Status: Normal   Collection Time   03/16/12 10:00 PM      Component Value  Range Status Comment   Specimen Description URINE, CATHETERIZED   Final    Special Requests Normal   Final    Culture  Setup Time 03/17/2012 04:01   Final    Colony Count NO GROWTH   Final    Culture NO GROWTH   Final    Report Status 03/18/2012 FINAL   Final   INFLUENZA VIRUS AG, A+B (DFA)     Status: Normal   Collection Time   03/18/12 10:10 AM      Component Value Range Status Comment   Influenza Virus A and B Ag REPORT   Final   CULTURE, BLOOD (ROUTINE X 2)     Status: Normal (Preliminary result)   Collection Time   03/19/12 12:10 PM      Component Value Range Status Comment   Specimen Description BLOOD HAND RIGHT   Final    Special Requests BOTTLES DRAWN AEROBIC AND ANAEROBIC 10CC EACH   Final    Culture  Setup Time 03/19/2012 19:22   Final    Culture     Final    Value:        BLOOD CULTURE RECEIVED NO GROWTH TO DATE CULTURE WILL BE HELD FOR 5 DAYS BEFORE ISSUING A FINAL NEGATIVE REPORT   Report Status PENDING   Incomplete   URINE CULTURE     Status: Normal   Collection Time   03/19/12 12:13 PM      Component Value Range Status Comment   Specimen Description URINE, CATHETERIZED   Final    Special Requests NONE   Final    Culture  Setup Time 03/19/2012 14:03   Final    Colony Count >=100,000 COLONIES/ML   Final    Culture ESCHERICHIA COLI   Final    Report Status 03/21/2012 FINAL   Final    Organism ID, Bacteria ESCHERICHIA COLI   Final   CULTURE, BLOOD (ROUTINE X 2)     Status: Normal (Preliminary result)   Collection Time   03/19/12 12:18 PM      Component Value Range Status Comment   Specimen Description BLOOD HAND LEFT   Final    Special  Requests BOTTLES DRAWN AEROBIC AND ANAEROBIC 10CC EACH   Final    Culture  Setup Time 03/19/2012 19:22   Final    Culture     Final    Value:        BLOOD CULTURE RECEIVED NO GROWTH TO DATE CULTURE WILL BE HELD FOR 5 DAYS BEFORE ISSUING A FINAL NEGATIVE REPORT   Report Status PENDING   Incomplete      Studies:  Recent x-ray studies have been reviewed in detail by the Attending Physician  Scheduled Meds:  Reviewed in detail by the Attending Physician   Calvert Cantor, MD [phone] If 7PM-7AM, please contact night-coverage [email or web address] Password [email or web address] 03/23/2012, 5:51 PM   LOS: 7 days

## 2012-03-23 NOTE — Progress Notes (Signed)
Patient is being transferred to room 3027 via his bed. Phone report called to [facility]. Patient and family members aware of the transfer.

## 2012-03-24 DIAGNOSIS — J811 Chronic pulmonary edema: Secondary | ICD-10-CM

## 2012-03-24 LAB — GLUCOSE, CAPILLARY
Glucose-Capillary: 151 mg/dL — ABNORMAL HIGH (ref 70–99)
Glucose-Capillary: 91 mg/dL (ref 70–99)
Glucose-Capillary: 118 mg/dL — ABNORMAL HIGH (ref 70–99)
Glucose-Capillary: 108 mg/dL — ABNORMAL HIGH (ref 70–99)

## 2012-03-24 LAB — BASIC METABOLIC PANEL
Chloride: 104 mEq/L (ref 96–112)
GFR calc Af Amer: >90 mL/min (ref >90)
GFR calc non Af Amer: 81 mL/min — ABNORMAL LOW (ref >90)
Potassium: 3.4 mEq/L — ABNORMAL LOW (ref 3.5–5.1)

## 2012-03-24 LAB — PROTIME-INR
INR: 2.84 — ABNORMAL HIGH (ref 0.00–1.49)
Prothrombin Time: 28.4 seconds — ABNORMAL HIGH (ref 11.6–15.2)

## 2012-03-24 MED ORDER — POTASSIUM CHLORIDE CRYS ER 20 MEQ PO TBCR
40.0000 meq | EXTENDED_RELEASE_TABLET | ORAL | Status: AC
  Administered 2012-03-24 (×2): 40 meq via ORAL
  Filled 2012-03-24 (×2): qty 2

## 2012-03-24 MED ORDER — CEPHALEXIN 500 MG PO CAPS
500.0000 mg | ORAL_CAPSULE | Freq: Two times a day (BID) | ORAL | Status: DC
  Administered 2012-03-25 – 2012-03-27 (×5): 500 mg via ORAL
  Filled 2012-03-24 (×6): qty 1

## 2012-03-24 MED ORDER — FUROSEMIDE 20 MG PO TABS
20.0000 mg | ORAL_TABLET | Freq: Every day | ORAL | Status: DC
  Administered 2012-03-25 – 2012-03-27 (×3): 20 mg via ORAL
  Filled 2012-03-24 (×3): qty 1

## 2012-03-24 MED ORDER — WARFARIN SODIUM 2.5 MG PO TABS
2.5000 mg | ORAL_TABLET | Freq: Once | ORAL | Status: AC
  Administered 2012-03-24: 2.5 mg via ORAL
  Filled 2012-03-24: qty 1

## 2012-03-24 MED ORDER — LEVETIRACETAM 500 MG PO TABS
1000.0000 mg | ORAL_TABLET | Freq: Two times a day (BID) | ORAL | Status: DC
  Administered 2012-03-24 – 2012-03-27 (×6): 1000 mg via ORAL
  Filled 2012-03-24 (×7): qty 2

## 2012-03-24 MED ORDER — FUROSEMIDE 40 MG PO TABS: 40.0000 mg | ORAL_TABLET | Freq: Every day | ORAL | Status: DC

## 2012-03-24 NOTE — Progress Notes (Signed)
[facility] Progress Note  Terry Robertson:[id number] DOB: 1928/04/06 DOA: 03/16/2012 PCP: Gwynneth Aliment, MD  Assessment/Plan:  Recurrent, breakthrough focal seizure activity with focal motor activity / altered mental status  -Continue Keppra 1000mg  BID; no further seizure. - MRI raised a question of acute lacunar infract in dorsal L thalamus, but was noncommittal to this diagnosis; neurology does not feel there is an acute infarct (Dr Pearlean Brownie commented that his seizures are likely a result of the prior infarct and acute infection).  Acute respiratory failure secondary to seizure and RVR Due to above - do not require intubation - Will start daily PO lasix; patient with grade 2 diastolic dysfunction at baseline  Fever - UTI- Ecoli No further fever. Antibiotics transition to PO to complete tx. Will use keflex base on sensitivity and to avoid to much interaction with coumadin.  Dysphagia Follow SPT Continue thin liquids and puree diet  Chronic afib-  Rate now controlled and per telemetry evaluation NSR Continue B-blocker and cardizem at current dose On chronic coumadin tx; pharmacy to dose.  DM CBG reasonably controlled at this time - follow w/ SSI  HTN Stable. Continue current dose.  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 Continue coumadin.  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 Juliette Alcide - JAK2 mutation is negative - plt count was 84 in Oct -  No acute bleeding Follow platelets trend  CAD On ASA- resuming today  Deconditioning Follow results of CIR authorization and acceptance; if not in agreement to go to SNF for rehab.  Hypokalemia:  due to diuresis Will replete. BMET in am   Code Status: FULL Disposition Plan: CIR vs SNF  Consultants: Neuro/Stroke Team   Antibiotics: Rocephin 1/15 >>1/19 Keflex 1/19>>1/24  DVT  prophylaxis: Coumadin   HPI/Subjective: Afebrile; AAOX3, no acute distress; denies any complaints. Breathing comfortable.   Objective: Blood pressure 136/64, pulse 66, temperature 98 F (36.7 C), temperature source Oral, resp. rate 20, height 5\' 5"  (1.651 m), weight 111.131 kg (245 lb), SpO2 95.00%.  Intake/Output Summary (Last 24 hours) at 03/24/12 1557 Last data filed at 03/24/12 0500  Gross per 24 hour  Intake    480 ml  Output    300 ml  Net    180 ml     Exam: General: NAD, afebrile Lungs: CTA b/l  Cardiovascular: RRR - no rubs, no gallops Abdomen: Nontender, nondistended, soft, bowel sounds positive, no rebound, no ascites, no appreciable mass Extremities: trace B le edema w/o cyanosis or clubbing   Data Reviewed: Basic Metabolic Panel:  Lab 03/24/12 8119 03/23/12 1224 03/22/12 0450 03/21/12 0450 03/20/12 0500  NA 137 138 141 146* 143  K 3.4* 3.6 3.7 4.2 3.9  CL 104 103 109 113* 108  CO2 23 23 20 22 21   GLUCOSE 107* 100* 126* 131* 122*  BUN 21 24* 23 27* 19  CREATININE 0.81 0.92 0.99 1.16 1.01  CALCIUM 8.5 8.5 8.4 8.7 9.0  MG -- -- -- -- --  PHOS -- -- -- -- --   Liver Function Tests:  Lab 03/21/12 0450 03/20/12 0500  AST 45* 31  ALT 17 11  ALKPHOS 42 43  BILITOT 0.5 0.6  PROT 8.9* 9.5*  ALBUMIN 2.5* 2.8*   CBC:  Lab 03/21/12 0450 03/20/12 0500 03/19/12 0500  WBC 9.9 11.9* 10.1  NEUTROABS -- -- --  HGB 11.9* 13.1 12.7*  HCT 37.7*  40.4 40.6  MCV 84.2 85.1 84.2  PLT 67* 62* 62*   CBG:  Lab 03/24/12 1201 03/24/12 0758 03/24/12 0418 03/24/12 0055 03/23/12 2014  GLUCAP 98 91 95 151* 90    Recent Results (from the past 240 hour(s))  MRSA PCR SCREENING     Status: Normal   Collection Time   03/16/12  9:13 PM      Component Value Range Status Comment   MRSA by PCR NEGATIVE  NEGATIVE Final   CULTURE, BLOOD (ROUTINE X 2)     Status: Normal   Collection Time   03/16/12  9:35 PM      Component Value Range Status Comment   Specimen Description  BLOOD RIGHT ARM   Final    Special Requests BOTTLES DRAWN AEROBIC ONLY 5CC   Final    Culture  Setup Time 03/17/2012 04:04   Final    Culture NO GROWTH 5 DAYS   Final    Report Status 03/23/2012 FINAL   Final   CULTURE, BLOOD (ROUTINE X 2)     Status: Normal   Collection Time   03/16/12  9:50 PM      Component Value Range Status Comment   Specimen Description BLOOD RIGHT HAND   Final    Special Requests BOTTLES DRAWN AEROBIC ONLY 3CC   Final    Culture  Setup Time 03/17/2012 04:04   Final    Culture NO GROWTH 5 DAYS   Final    Report Status 03/23/2012 FINAL   Final   URINE CULTURE     Status: Normal   Collection Time   03/16/12 10:00 PM      Component Value Range Status Comment   Specimen Description URINE, CATHETERIZED   Final    Special Requests Normal   Final    Culture  Setup Time 03/17/2012 04:01   Final    Colony Count NO GROWTH   Final    Culture NO GROWTH   Final    Report Status 03/18/2012 FINAL   Final   INFLUENZA VIRUS AG, A+B (DFA)     Status: Normal   Collection Time   03/18/12 10:10 AM      Component Value Range Status Comment   Influenza Virus A and B Ag REPORT   Final   CULTURE, BLOOD (ROUTINE X 2)     Status: Normal (Preliminary result)   Collection Time   03/19/12 12:10 PM      Component Value Range Status Comment   Specimen Description BLOOD HAND RIGHT   Final    Special Requests BOTTLES DRAWN AEROBIC AND ANAEROBIC 10CC EACH   Final    Culture  Setup Time 03/19/2012 19:22   Final    Culture     Final    Value:        BLOOD CULTURE RECEIVED NO GROWTH TO DATE CULTURE WILL BE HELD FOR 5 DAYS BEFORE ISSUING A FINAL NEGATIVE REPORT   Report Status PENDING   Incomplete   URINE CULTURE     Status: Normal   Collection Time   03/19/12 12:13 PM      Component Value Range Status Comment   Specimen Description URINE, CATHETERIZED   Final    Special Requests NONE   Final    Culture  Setup Time 03/19/2012 14:03   Final    Colony Count >=100,000 COLONIES/ML   Final     Culture ESCHERICHIA COLI   Final    Report Status 03/21/2012 FINAL  Final    Organism ID, Bacteria ESCHERICHIA COLI   Final   CULTURE, BLOOD (ROUTINE X 2)     Status: Normal (Preliminary result)   Collection Time   03/19/12 12:18 PM      Component Value Range Status Comment   Specimen Description BLOOD HAND LEFT   Final    Special Requests BOTTLES DRAWN AEROBIC AND ANAEROBIC 10CC EACH   Final    Culture  Setup Time 03/19/2012 19:22   Final    Culture     Final    Value:        BLOOD CULTURE RECEIVED NO GROWTH TO DATE CULTURE WILL BE HELD FOR 5 DAYS BEFORE ISSUING A FINAL NEGATIVE REPORT   Report Status PENDING   Incomplete      Shiryl Ruddy [phone] If 7PM-7AM, please contact night-coverage [email or web address] Password [email or web address] 03/24/2012, 3:57 PM   LOS: 8 days

## 2012-03-24 NOTE — Progress Notes (Signed)
ANTICOAGULATION CONSULT NOTE - Follow Up Consult  Pharmacy Consult for Coumadin Indication: atrial fibrillation  No Known Allergies  Patient Measurements: Height: 5\' 5"  (165.1 cm) Weight: 245 lb (111.131 kg) IBW/kg (Calculated) : 61.5  Heparin Dosing Weight:   Vital Signs: Temp: 98.4 F (36.9 C) (01/19 0500) BP: 142/82 mmHg (01/19 0500) Pulse Rate: 70  (01/19 0500)  Labs:  Basename 03/24/12 0628 03/23/12 1224 03/23/12 0530 03/22/12 0450  HGB -- -- -- --  HCT -- -- -- --  PLT -- -- -- --  APTT -- -- -- --  LABPROT 28.4* -- 32.1* 27.1*  INR 2.84* -- 3.35* 2.67*  HEPARINUNFRC -- -- -- --  CREATININE 0.81 0.92 -- 0.99  CKTOTAL -- -- -- --  CKMB -- -- -- --  TROPONINI -- -- -- --    Estimated Creatinine Clearance: 82.2 ml/min (by C-G formula based on Cr of 0.81).  Assessment: 1. 83yom on Coumadin for hx Afib. INR (2.84) is now therapeutic after trending down with held dose. Patient still has poor intake per documentation - will order Coumadin 2.5mg  tonight and follow-up AM INR. - No CBC this AM - No significant bleeding reported - PTA regimen: 7.5mg  daily except 10mg  on MWF  2. Patient remains on Ceftriaxone for Ecoli UTI. Per note, MD changing to Cipro to complete therapy. Ecoli is sensitive to all except Ampicillin - recommend to change to Keflex 500mg  BID instead of Cipro (less interaction with Coumadin and reduction of Fluoroquinolone use).  Goal of Therapy:  INR 2-3   Plan:  1. Coumadin 2.5mg  po x 1 today 2. Follow-up AM INR 3. Recommend to change Ceftriaxone to Keflex 500mg  BID for Ecoli UTI  Cleon Dew [phone] 03/24/2012,8:31 AM

## 2012-03-25 DIAGNOSIS — I959 Hypotension, unspecified: Secondary | ICD-10-CM

## 2012-03-25 LAB — CULTURE, BLOOD (ROUTINE X 2): Culture: NO GROWTH

## 2012-03-25 LAB — PROTIME-INR: Prothrombin Time: 23.1 seconds — ABNORMAL HIGH (ref 11.6–15.2)

## 2012-03-25 LAB — CBC
Hemoglobin: 11 g/dL — ABNORMAL LOW (ref 13.0–17.0)
MCH: 26.1 pg (ref 26.0–34.0)
MCV: 82.5 fL (ref 78.0–100.0)
Platelets: 135 10*3/uL — ABNORMAL LOW (ref 150–400)
RBC: 4.22 MIL/uL (ref 4.22–5.81)
WBC: 4.2 10*3/uL (ref 4.0–10.5)

## 2012-03-25 LAB — BASIC METABOLIC PANEL
CO2: 24 mEq/L (ref 19–32)
Calcium: 9.2 mg/dL (ref 8.4–10.5)
Chloride: 105 mEq/L (ref 96–112)
Glucose, Bld: 112 mg/dL — ABNORMAL HIGH (ref 70–99)
Sodium: 141 mEq/L (ref 135–145)

## 2012-03-25 LAB — GLUCOSE, CAPILLARY
Glucose-Capillary: 115 mg/dL — ABNORMAL HIGH (ref 70–99)
Glucose-Capillary: 138 mg/dL — ABNORMAL HIGH (ref 70–99)

## 2012-03-25 MED ORDER — WARFARIN SODIUM 5 MG PO TABS
5.0000 mg | ORAL_TABLET | Freq: Once | ORAL | Status: AC
  Administered 2012-03-25: 5 mg via ORAL
  Filled 2012-03-25: qty 1

## 2012-03-25 NOTE — Progress Notes (Signed)
ANTICOAGULATION CONSULT NOTE - Follow Up Consult  Pharmacy Consult for Coumadin Indication: atrial fibrillation  No Known Allergies  Patient Measurements: Height: 5\' 5"  (165.1 cm) Weight: 247 lb 1.6 oz (112.084 kg) IBW/kg (Calculated) : 61.5   Vital Signs: Temp: 98.2 F (36.8 C) (01/20 0500) BP: 155/79 mmHg (01/20 0500) Pulse Rate: 68  (01/20 0500)  Labs:  Basename 03/25/12 0555 03/24/12 0628 03/23/12 1224 03/23/12 0530  HGB 11.0* -- -- --  HCT 34.8* -- -- --  PLT 135* -- -- --  APTT -- -- -- --  LABPROT 23.1* 28.4* -- 32.1*  INR 2.15* 2.84* -- 3.35*  HEPARINUNFRC -- -- -- --  CREATININE 0.76 0.81 0.92 --  CKTOTAL -- -- -- --  CKMB -- -- -- --  TROPONINI -- -- -- --    Estimated Creatinine Clearance: 83.7 ml/min (by C-G formula based on Cr of 0.76).  Assessment: 1. 83yom on Coumadin for hx Afib. INR (2.15) remains therapeutic. No bleeding noted. H/H is stable and plts have improved to 135.   Goal of Therapy:  INR 2-3   Plan:  1. Coumadin 5mg  PO x 1 tonight 2. F/u AM INR  Lysle Pearl, PharmD, BCPS Pager # [phone] 03/25/2012 9:29 AM

## 2012-03-25 NOTE — Progress Notes (Signed)
Rehab admissions - given that today is a holiday, doubt that I will hear back from insurance carrier today.  I will follow for progress and update all when I hear back from insurance case manager.  Call me for questions.  #[phone]

## 2012-03-25 NOTE — Progress Notes (Signed)
Physical Therapy Treatment Patient Details Name: Terry Robertson MRN: [id number] DOB: [birth year] Today's Date: 03/25/2012 Time: 4098-1191 PT Time Calculation (min): 17 min  PT Assessment / Plan / Recommendation Comments on Treatment Session  Pt with increased alertness and orientation today. No perservating today with session and was able to answer 90% of questions appropriately. Did need some incr time with responding to questions (?word finding/stuttering/other issues). Max cues for safety as pt demo'd incr impulsivity today. Spouse present for PT session  as well. Mobility improving however pt still demo's motor planning deficits and problem solving issues.        Follow Up Recommendations  CIR           Equipment Recommendations  None recommended by PT    Recommendations for Other Services Rehab consult  Frequency Min 3X/week   Plan Discharge plan remains appropriate;Frequency remains appropriate    Precautions / Restrictions Precautions Precautions: Fall    Pertinent Vitals/Pain No complaints of pain. Pt found to be wet in bed due to condom cath leaking. RN notified and to address this issue.    Mobility  Bed Mobility Supine to Sit: 4: Min assist;HOB elevated (HOB 30 degrees) Supine to Sit: Patient Percentage: 80% Sitting - Scoot to Edge of Bed: 4: Min guard Details for Bed Mobility Assistance: cues for use of arms to assist with transitional movements. cues for ant wt shifing to assist with scooting to edge of bed. Transfers Sit to Stand: 3: Mod assist;From bed;With upper extremity assist Sit to Stand: Patient Percentage: 70% Stand to Sit: 3: Mod assist;To chair/3-in-1;With upper extremity assist;With armrests Stand to Sit: Patient Percentage: 70% Details for Transfer Assistance: cues for hand placement with transfers, to ant wt shift to assist with standing, and to back all the way to chair before sitting down. Ambulation/Gait Ambulation/Gait Assistance: 4: Min assist  (plus chair follow for safety) Ambulation/Gait: Patient Percentage: 80% Ambulation Distance (Feet): 30 Feet Assistive device: Rolling walker Gait Pattern: Step-through pattern;Decreased stride length;Decreased step length - right;Decreased step length - left;Trunk flexed;Narrow base of support;Shuffle      PT Goals Acute Rehab PT Goals PT Goal: Supine/Side to Sit - Progress: Met PT Goal: Sit to Stand - Progress: Progressing toward goal PT Goal: Stand to Sit - Progress: Progressing toward goal PT Transfer Goal: Bed to Chair/Chair to Bed - Progress: Progressing toward goal PT Goal: Ambulate - Progress: Met  Visit Information  Last PT Received On: 03/25/12 Assistance Needed: +2 (for safety/to incr mobility)    Subjective Data  Subjective: No new complaints, agreeable to therapy. "to get walking again" when asked why he was here.   Cognition  Overall Cognitive Status: Impaired Area of Impairment: Memory;Problem solving;Safety/judgement Arousal/Alertness: Awake/alert Orientation Level: Disoriented to;Time (recall month with time, unable to recall year) Behavior During Session: Flat affect Current Attention Level: Selective Following Commands: Follows multi-step commands with increased time Safety/Judgement: Impulsive Safety/Judgement - Other Comments: cues to slow down with bed mobility, transfers and gait for safety (wait after sitting up before standing, slow down with turns and stay with walker, get all the way to surface before sitting for safety)       End of Session PT - End of Session Equipment Utilized During Treatment: Gait belt Activity Tolerance: Patient tolerated treatment well Patient left: in chair;with call bell/phone within reach;with family/visitor present Nurse Communication: Mobility status   GP     Sallyanne Kuster 03/25/2012, 10:36 AM  Sallyanne Kuster, PTA Office- [phone]

## 2012-03-25 NOTE — Progress Notes (Signed)
[facility] Progress Note  Terry Robertson WUJ:[id number] DOB: 22-Feb-1929 DOA: 03/16/2012 PCP: Gwynneth Aliment, MD  Assessment/Plan:  Recurrent, breakthrough focal seizure activity with focal motor activity / altered mental status  -Continue Keppra 1000mg  BID; no further seizure. - MRI raised a question of acute lacunar infract in dorsal L thalamus, but was noncommittal to this diagnosis; neurology does not feel there is an acute infarct (Dr Pearlean Brownie commented that his seizures are likely a result of the prior infarct and acute infection).  Acute respiratory failure secondary to seizure and RVR Due to above - do not require intubation - Will continue daily PO lasix; patient with grade 2 diastolic dysfunction at baseline No crackles, good O2 sat on RA  Fever - UTI- Ecoli No further fever. Antibiotics transition to PO to complete tx. Will use keflex for 4 more days base on sensitivity and to avoid to much interaction with coumadin.  Dysphagia Secondary to decondition and acute encephalopathy from seizure and UTI Continue thin liquids and puree diet as recommended by SPT  Chronic afib-  Rate now controlled and per telemetry evaluation NSR Continue B-blocker and cardizem at current dose On chronic coumadin tx; pharmacy to dose.  DM CBG reasonably controlled at this time - continue SSI  HTN Stable. Continue antihypertensive at current dose.  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 Continue coumadin.  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 Juliette Alcide - JAK2 mutation is negative - plt count was 84 in Oct -  No acute bleeding Platelets 135 today and stable. Will monitor  CAD On ASA- resuming today  Deconditioning Follow results of CIR authorization and acceptance; if not in agreement to go to SNF for rehab; SW helping with back up  plan  Hypokalemia:  due to diuresis repleted   Code Status: FULL Disposition Plan: CIR vs SNF  Consultants: Neuro/Stroke Team   Antibiotics: Rocephin 1/15 >>1/19 Keflex 1/19>>1/24  DVT prophylaxis: Coumadin   HPI/Subjective: Afebrile; AAOX3, no acute distress; denies any complaints. Breathing comfortable.   Objective: Blood pressure 157/67, pulse 65, temperature 98.1 F (36.7 C), temperature source Oral, resp. rate 20, height 5\' 5"  (1.651 m), weight 112.084 kg (247 lb 1.6 oz), SpO2 96.00%.  Intake/Output Summary (Last 24 hours) at 03/25/12 1550 Last data filed at 03/25/12 1432  Gross per 24 hour  Intake    480 ml  Output   1600 ml  Net  -1120 ml     Exam: General: NAD, afebrile Lungs: CTA b/l  Cardiovascular: RRR - no rubs, no gallops Abdomen: Nontender, nondistended, soft, bowel sounds positive, no rebound, no ascites, no appreciable mass Extremities: trace B le edema w/o cyanosis or clubbing   Data Reviewed: Basic Metabolic Panel:  Lab 03/25/12 9562 03/24/12 0628 03/23/12 1224 03/22/12 0450 03/21/12 0450  NA 141 137 138 141 146*  K 4.4 3.4* 3.6 3.7 4.2  CL 105 104 103 109 113*  CO2 24 23 23 20 22   GLUCOSE 112* 107* 100* 126* 131*  BUN 15 21 24* 23 27*  CREATININE 0.76 0.81 0.92 0.99 1.16  CALCIUM 9.2 8.5 8.5 8.4 8.7  MG -- -- -- -- --  PHOS -- -- -- -- --   Liver Function Tests:  Lab 03/21/12 0450 03/20/12 0500  AST 45* 31  ALT 17 11  ALKPHOS 42 43  BILITOT 0.5 0.6  PROT 8.9* 9.5*  ALBUMIN 2.5* 2.8*  CBC:  Lab 03/25/12 0555 03/21/12 0450 03/20/12 0500 03/19/12 0500  WBC 4.2 9.9 11.9* 10.1  NEUTROABS -- -- -- --  HGB 11.0* 11.9* 13.1 12.7*  HCT 34.8* 37.7* 40.4 40.6  MCV 82.5 84.2 85.1 84.2  PLT 135* 67* 62* 62*   CBG:  Lab 03/25/12 1118 03/25/12 0731 03/25/12 0046 03/24/12 2033 03/24/12 1710  GLUCAP 129* 100* 124* 108* 118*    Recent Results (from the past 240 hour(s))  MRSA PCR SCREENING     Status: Normal   Collection Time    03/16/12  9:13 PM      Component Value Range Status Comment   MRSA by PCR NEGATIVE  NEGATIVE Final   CULTURE, BLOOD (ROUTINE X 2)     Status: Normal   Collection Time   03/16/12  9:35 PM      Component Value Range Status Comment   Specimen Description BLOOD RIGHT ARM   Final    Special Requests BOTTLES DRAWN AEROBIC ONLY 5CC   Final    Culture  Setup Time 03/17/2012 04:04   Final    Culture NO GROWTH 5 DAYS   Final    Report Status 03/23/2012 FINAL   Final   CULTURE, BLOOD (ROUTINE X 2)     Status: Normal   Collection Time   03/16/12  9:50 PM      Component Value Range Status Comment   Specimen Description BLOOD RIGHT HAND   Final    Special Requests BOTTLES DRAWN AEROBIC ONLY 3CC   Final    Culture  Setup Time 03/17/2012 04:04   Final    Culture NO GROWTH 5 DAYS   Final    Report Status 03/23/2012 FINAL   Final   URINE CULTURE     Status: Normal   Collection Time   03/16/12 10:00 PM      Component Value Range Status Comment   Specimen Description URINE, CATHETERIZED   Final    Special Requests Normal   Final    Culture  Setup Time 03/17/2012 04:01   Final    Colony Count NO GROWTH   Final    Culture NO GROWTH   Final    Report Status 03/18/2012 FINAL   Final   INFLUENZA VIRUS AG, A+B (DFA)     Status: Normal   Collection Time   03/18/12 10:10 AM      Component Value Range Status Comment   Influenza Virus A and B Ag REPORT   Final   CULTURE, BLOOD (ROUTINE X 2)     Status: Normal   Collection Time   03/19/12 12:10 PM      Component Value Range Status Comment   Specimen Description BLOOD HAND RIGHT   Final    Special Requests BOTTLES DRAWN AEROBIC AND ANAEROBIC 10CC EACH   Final    Culture  Setup Time 03/19/2012 19:22   Final    Culture NO GROWTH 5 DAYS   Final    Report Status 03/25/2012 FINAL   Final   URINE CULTURE     Status: Normal   Collection Time   03/19/12 12:13 PM      Component Value Range Status Comment   Specimen Description URINE, CATHETERIZED   Final     Special Requests NONE   Final    Culture  Setup Time 03/19/2012 14:03   Final    Colony Count >=100,000 COLONIES/ML   Final    Culture ESCHERICHIA COLI   Final  Report Status 03/21/2012 FINAL   Final    Organism ID, Bacteria ESCHERICHIA COLI   Final   CULTURE, BLOOD (ROUTINE X 2)     Status: Normal   Collection Time   03/19/12 12:18 PM      Component Value Range Status Comment   Specimen Description BLOOD HAND LEFT   Final    Special Requests BOTTLES DRAWN AEROBIC AND ANAEROBIC 10CC EACH   Final    Culture  Setup Time 03/19/2012 19:22   Final    Culture NO GROWTH 5 DAYS   Final    Report Status 03/25/2012 FINAL   Final      Maxim Bedel [phone] If 7PM-7AM, please contact night-coverage [email or web address] Password [email or web address] 03/25/2012, 3:50 PM   LOS: 9 days

## 2012-03-25 NOTE — PMR Pre-admission (Signed)
PMR Admission Coordinator Pre-Admission Assessment  Patient: Terry Robertson is an 79 y.o., male MRN: [id number] DOB: Mar 10, 1928 Height: 5\' 5"  (165.1 cm) Weight: 112.084 kg (247 lb 1.6 oz)            Insurance Information HMO:      PPO:       PCP:       IPA:       80/20:       OTHER:  Group # [id number] PRIMARY: [employer]      Policy#: [id number]      Subscriber: Pascal Lux CM Name: Tilford Pillar      Phone#: [phone]     Fax#: [phone] Pre-Cert#:                  Employer: Retired Benefits:  Phone #: [phone]     Name: Joslyn Hy. Date: 07/05/11     Deduct: $0      Out of Pocket Max: $3950      Life Max: None CIR: w/auth $265 days 1-6, $0 days 7-90      SNF: w/auth $0 days 1-7, $25 days 8-20, $135 days 21-100 Outpatient: w/medical necessity     Co-Pay: $40/visit Home Health: 100%      Co-Pay: none DME: 80% w/auth > $100 and for all rentals     Co-Pay: 20% Providers: in network  SECONDARY: Guaranteed Trust      Policy#: [id number]      Subscriber: Pascal Lux CM Name:        Phone#:       Fax#:   Pre-Cert#:        Employer: Retired Benefits:  Phone #:  [phone]     Name:   Eff. Date:       Deduct:        Out of Pocket Max:        Life Max:   CIR:        SNF:   Outpatient:       Co-Pay:   Home Health:        Co-Pay:   DME:       Co-Pay:    Emergency Contact Information Contact Information    Name Relation Home Work Mobile   Council Daughter [phone]  [phone]   Erlene Quan [phone]  [phone]   Greg, [place] [phone]     [place], [place] [phone]       Current Medical History  Patient Admitting Diagnosis:  Seizures, UTI, AFib  History of Present Illness:  A  79 y.o. 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 He was admitted on 03/16/12 with markedly reduced speech output  as well as eyes deviated to the right side. He was also noted not to be moving his right side, as well. CT scan of his head showed old left parietal stroke. There were no acute findings. While in radiology the patient had a witnessed focal motor seizure involving his right arm right leg and right side of his face. Eyes were deviated tonically to the right side as well. He was given with 2 mg of Ativan. He's been taking Keppra 500 mg twice a day. A loading dose of Keppra 1 g was ordered to be given IV. Patient's seizure activity stopped following administration of initial dose of Ativan. He remained confused. MRI of brain without acute changes. Neurology consulted and recommended  increasing Keppra to 1000 mg bid. PT evaluation done and patient was noted to have difficulty following commands as well as problems with motor planning. ST evaluation done and patient started on D1 diet, thin liquids due to impaired mentation and decreased responses. Noted to be febrile on 01/15 and was started on IV rocephin for suspicion of UTI. MD, PT, ST recommending CIR.  On 01/20 he is now participating and tolerating therapies.  He is answering questions appropriately.  He is alert and responsive.     Total: 12 =NIH  Past Medical History  Past Medical History  Diagnosis Date  . Coronary artery disease   . Diabetes mellitus   . Hypertension   . Stroke   . Seizures    Family History  family history is not on file.  Prior Rehab/Hospitalizations: Had HH therapies after CVA in 06/13 and again in 09/13.   Current Medications  Current facility-administered medications:0.9 %  sodium chloride infusion, 250 mL, Intravenous, PRN, Carolan Clines, MD;  acetaminophen (TYLENOL) suppository 650 mg, 650 mg, Rectal, Q4H PRN, Lonia Blood, MD, 650 mg at 03/20/12 1303;  aspirin EC tablet 81 mg, 81 mg, Oral, Daily, Calvert Cantor, MD, 81 mg at 03/25/12 1012;  atenolol (TENORMIN) tablet 12.5 mg, 12.5 mg, Oral, Daily, Calvert Cantor,  MD, 12.5 mg at 03/25/12 1012 cephALEXin (KEFLEX) capsule 500 mg, 500 mg, Oral, Q12H, Vassie Loll, MD, 500 mg at 03/25/12 1012;  diltiazem (CARDIZEM CD) 24 hr capsule 180 mg, 180 mg, Oral, Daily, Brett Fairy, PHARMD, 180 mg at 03/25/12 1012;  feeding supplement (ENSURE COMPLETE) liquid 237 mL, 237 mL, Oral, TID BM, Hettie Holstein, RD, 237 mL at 03/24/12 2044;  furosemide (LASIX) tablet 20 mg, 20 mg, Oral, Daily, Vassie Loll, MD, 20 mg at 03/25/12 1012 insulin aspart (novoLOG) injection 0-15 Units, 0-15 Units, Subcutaneous, TID WC, Calvert Cantor, MD, 2 Units at 03/22/12 1708;  levETIRAcetam (KEPPRA) tablet 1,000 mg, 1,000 mg, Oral, BID, Vassie Loll, MD, 1,000 mg at 03/25/12 1012;  LORazepam (ATIVAN) injection 1-2 mg, 1-2 mg, Intravenous, Q1H PRN, Cristal Ford, MD, 1 mg at 03/16/12 2019;  omega-3 acid ethyl esters (LOVAZA) capsule 2 g, 2 g, Oral, BID, Calvert Cantor, MD, 2 g at 03/25/12 1012 RESOURCE THICKENUP CLEAR, , Oral, PRN, Lonia Blood, MD;  terazosin (HYTRIN) capsule 2 mg, 2 mg, Oral, QHS, Saima Rizwan, MD, 2 mg at 03/24/12 2103;  warfarin (COUMADIN) tablet 5 mg, 5 mg, Oral, ONCE-1800, Drake Leach Rumbarger, PHARMD;  Warfarin - Pharmacist Dosing Inpatient, , Does not apply, q1800, Cleon Dew, PHARMD  Patients Current Diet: Dysphagia  Precautions / Restrictions Precautions Precautions: Fall Restrictions Weight Bearing Restrictions: No   Prior Activity Level Went out daily.   Home Assistive Devices / Equipment Home Assistive Devices/Equipment: Dan Humphreys (specify type)  Prior Functional Level Prior Function Level of Independence: Needs assistance;Independent with assistive device(s) (occasional use of SPC) Needs Assistance: Bathing;Dressing;Meal Prep;Light Housekeeping Bath: Supervision/set-up Dressing: Supervision/set-up Meal Prep: Minimal Light Housekeeping: Minimal Able to Take Stairs?: Yes Driving: Yes Vocation: Retired  Current Functional  Level Cognition  Arousal/Alertness: Awake/alert Overall Cognitive Status: Impaired Current Attention Level: Selective Memory:  (unable to assess) Orientation Level: Oriented X4 Following Commands: Follows multi-step commands with increased time Safety/Judgement: Impulsive Safety/Judgement - Other Comments: cues to slow down with bed mobility, transfers and gait for safety (wait after sitting up before standing, slow down with turns and stay with walker, get all the way to surface before sitting for safety)  Extremity Assessment (includes Sensation/Coordination)  RUE ROM/Strength/Tone: Unable to fully assess;Due to impaired cognition;Deficits RUE ROM/Strength/Tone Deficits: Pt demonstrates AROM of the hand wrist and elbow during session with gravity eliminated however not able to fully assess due to global aphasia and cognitive deficits. Pt resisting ROM on Rt UE during testing. RUE Sensation:  (unable to assess)  RLE ROM/Strength/Tone: Unable to fully assess;Due to impaired cognition (Pt able to extend right LE sitting EOB with visual cues) RLE Sensation: Deficits (unable to fully assess)    ADLs  Grooming: Performed;Wash/dry hands;Wash/dry face Where Assessed - Grooming: Unsupported standing Lower Body Bathing: +2 Total assistance Lower Body Bathing: Patient Percentage: 10% Where Assessed - Lower Body Bathing: Supported sit to stand (total (A) for peri care ) Lower Body Dressing: Performed;Moderate assistance Where Assessed - Lower Body Dressing: Unsupported sitting;Unsupported standing Toilet Transfer: Performed;Minimal assistance Toilet Transfer: Patient Percentage: 90% Toilet Transfer Method: Stand pivot Toilet Transfer Equipment: Comfort height toilet;Grab bars Toileting - Clothing Manipulation and Hygiene: Minimal assistance;Performed Toileting - Architect and Hygiene: Patient Percentage: 80% Where Assessed - Glass blower/designer Manipulation and Hygiene:  Standing Equipment Used: Rolling walker Transfers/Ambulation Related to ADLs: Pt. required cues for proper hand placement ADL Comments:  (Pt. would benefit from AE training)    Mobility  Bed Mobility: Rolling Right;Right Sidelying to Sit Rolling Right: 3: Mod assist;With rail Right Sidelying to Sit: 3: Mod assist;With rails Supine to Sit: 4: Min assist;HOB elevated (HOB 30 degrees) Supine to Sit: Patient Percentage: 80% Sitting - Scoot to Edge of Bed: 4: Min guard    Transfers  Transfers: Sit to Stand;Stand to [employer] Transfers Sit to Stand: 3: Mod assist;From bed;With upper extremity assist Sit to Stand: Patient Percentage: 70% Stand to Sit: 3: Mod assist;To chair/3-in-1;With upper extremity assist;With armrests Stand to Sit: Patient Percentage: 70% Stand Pivot Transfers: 1: +2 Total assist Stand Pivot Transfers: Patient Percentage: 40%    Ambulation / Gait / Stairs / Wheelchair Mobility  Ambulation/Gait Ambulation/Gait Assistance: 4: Min assist (plus chair follow for safety) Ambulation/Gait: Patient Percentage: 80% Ambulation Distance (Feet): 30 Feet Assistive device: Rolling walker Ambulation/Gait Assistance Details: +2 (A) for safety and chair to follow due to overall weakness.  Pt need max cues for RW management and proper body position within RW.  Pt with motor planning deficts with LE placement.  Needs extra time and visual cues to complete step sequence. Gait Pattern: Step-through pattern;Decreased stride length;Decreased step length - right;Decreased step length - left;Trunk flexed;Narrow base of support;Shuffle Stairs: No Wheelchair Mobility Wheelchair Mobility: No    Posture / Games developer Sitting - Balance Support: Feet supported;No upper extremity supported Static Sitting - Level of Assistance: 5: Stand by assistance Static Sitting - Comment/# of Minutes: pt with lt lean and LOB Static Standing Balance Static Standing - Balance  Support: Bilateral upper extremity supported Static Standing - Level of Assistance: 4: Min assist Static Standing - Comment/# of Minutes: Initial (A) to maintain balance with cues for upright posture     Special needs/care consideration BiPAP/CPAP Yes, has a CPAP at home CPM No Continuous Drip IV NO Dialysis NO        Life Vest No Oxygen No Special Bed No Trach Size No Wound Vac (area) No      Skin No                              Bowel mgmt: Had BM 03/24/12  Bladder mgmt: Has a condom catheter Diabetic mgmt  Yes     Previous Home Environment Living Arrangements: Children Lives With: Son (Lives with 3 sons.  2 of the sons work.) Available Help at Discharge: Available 24 hours/day Type of Home: House Home Layout: Two level Home Access: Stairs to enter Entrance Stairs-Rails: None Entrance Stairs-Number of Steps: 2 Bathroom Shower/Tub: Walk-in shower;Door [employer] Toilet: Standard Bathroom Accessibility: Yes Home Care Services: No  Discharge Living Setting Plans for Discharge Living Setting: Patient's home;Alone;House Type of Home at Discharge: House Discharge Home Layout: One level Discharge Home Access: Stairs to enter Entrance Stairs-Number of Steps: 3 Do you have any problems obtaining your medications?: No  Social/Family/Support Systems Patient Roles: Parent;Other (Comment) (Has a girlfriend who lives 3-4 minutes away.) Contact Information: Valinda Hoar - Dtr, Delma Officer - girlfriend Anticipated Caregiver: girlfriend and family Anticipated Caregiver's Contact Information: See emergency contact list Ability/Limitations of Caregiver: Girlfriend can assist.  Dtr in [place].  Has 5 sons, 1 dtr, 2 step dtrs and a step son Caregiver Availability: Other (Comment) (GF can move in with him if needed.  Could go to [place] with dtr.) Discharge Plan Discussed with Primary Caregiver: Yes Is Caregiver In Agreement with Plan?: Yes Does Caregiver/Family have Issues with  Lodging/Transportation while Pt is in Rehab?: No  Goals/Additional Needs Patient/Family Goal for Rehab: PT/OT/ST Supervision goals Expected length of stay: 7-10 days Cultural Considerations: [employer] Dietary Needs: Dys 1, thin liquids Equipment Needs: TBD Pt/Family Agrees to Admission and willing to participate: Yes Program Orientation Provided & Reviewed with Pt/Caregiver Including Roles  & Responsibilities: Yes   Decrease burden of Care through IP rehab admission:  Not applicable  Possible need for SNF placement upon discharge: Yes, if family cannot manage after discharge.  Patient Condition: This patient's  functional status has changed. His endurance has improved since he was seen by Dr. Wynn Banker in consult on 03/21/12.  Patient is participating and tolerating therapies.  He is tolerating sitting up in chair during the day.   After speaking with the rehab MD and the acute therapy  Team, we feel that he is appropriate for inpatient rehab. Will admit to inpatient rehab today.  Preadmission Screen Completed By:  Trish Mage, 03/25/2012 11:58 AM ______________________________________________________________________   Discussed status with Dr. Riley Kill on 03/27/12 at 1422 and received telephone approval for admission today.  Admission Coordinator:  Trish Mage, time1422/Date01/22/14

## 2012-03-25 NOTE — Progress Notes (Signed)
Clinical Social Worker reviewed bed offers. [facility] first choice; message left with Revonda Standard.  CIR continues to pursue insurance authorization.   Angelia Mould, MSW, [place] [phone]

## 2012-03-25 NOTE — Progress Notes (Signed)
Occupational Therapy Treatment Patient Details Name: Terry Robertson MRN: [id number] DOB: [birth year] Today's Date: 03/25/2012 Time: 1115-1200 OT Time Calculation (min): 45 min  OT Assessment / Plan / Recommendation Comments on Treatment Session Pt. plans on d/c to CIR (Pt. plans on d/c to CIR)    Follow Up Recommendations  CIR    Barriers to Discharge       Equipment Recommendations       Recommendations for Other Services    Frequency Min 2X/week   Plan Discharge plan remains appropriate    Precautions / Restrictions Precautions Precautions: Fall   Pertinent Vitals/Pain No c/o    ADL  Grooming: Performed;Wash/dry hands;Wash/dry face Where Assessed - Grooming: Unsupported standing Lower Body Dressing: Performed;Moderate assistance Where Assessed - Lower Body Dressing: Unsupported sitting;Unsupported standing Toilet Transfer: Performed;Minimal Dentist: Patient Percentage: 90% Toilet Transfer Method: Stand pivot Acupuncturist: Comfort height toilet;Grab bars Toileting - Clothing Manipulation and Hygiene: Minimal assistance;Performed Toileting - Architect and Hygiene: Patient Percentage: 80% Where Assessed - Glass blower/designer Manipulation and Hygiene: Standing Equipment Used: Rolling walker Transfers/Ambulation Related to ADLs: Pt. required cues for proper hand placement ADL Comments:  (Pt. would benefit from AE training)    OT Diagnosis:    OT Problem List:   OT Treatment Interventions:     OT Goals Acute Rehab OT Goals OT Goal Formulation: With patient Time For Goal Achievement: 04/03/12 Potential to Achieve Goals: Good ADL Goals Pt Will Perform Upper Body Bathing: with set-up;Sitting in shower;Sitting, edge of bed ADL Goal: Upper Body Bathing - Progress: Goal set today Pt Will Perform Lower Body Bathing: with supervision;Sitting, chair;Sitting, edge of bed;with adaptive equipment ADL Goal: Lower Body Bathing - Progress:  Goal set today Pt Will Perform Upper Body Dressing: with supervision;Sitting, chair;Unsupported ADL Goal: Upper Body Dressing - Progress: Goal set today Pt Will Perform Lower Body Dressing: with min assist;Unsupported;Sitting, chair;Sitting, bed;with adaptive equipment ADL Goal: Lower Body Dressing - Progress: Goal set today Pt Will Transfer to Toilet: with supervision ADL Goal: Toilet Transfer - Progress:  (goal updated) Miscellaneous OT Goals Miscellaneous OT Goal #2: Pt will complete sit<>stand Max (A) as precusor for 3n1 transfer (Pt. is Min A with sit to stand from chari and commode) OT Goal: Miscellaneous Goal #2 - Progress: Met  Visit Information  Last OT Received On: 03/25/12 Assistance Needed: +1    Subjective Data      Prior Functioning  Home Living Lives With: Son (Lives with 3 sons.  2 of the sons work.)    Cognition  Overall Cognitive Status: Impaired Area of Impairment: Memory Arousal/Alertness: Awake/alert Orientation Level: Place;Time;Situation Behavior During Session: WFL for tasks performed Current Attention Level: Selective Following Commands: Follows one step commands consistently Safety/Judgement: Impulsive Safety/Judgement - Other Comments:  (cues for hand placement and to make safe turns.)    Mobility  Shoulder Instructions Bed Mobility Supine to Sit: 4: Min assist;HOB elevated (HOB 30 degrees) Supine to Sit: Patient Percentage: 80% Sitting - Scoot to Edge of Bed: 4: Min guard Details for Bed Mobility Assistance: cues for use of arms to assist with transitional movements. cues for ant wt shifing to assist with scooting to edge of bed. Transfers Sit to Stand: 4: Min assist;With armrests;From chair/3-in-1;From toilet Sit to Stand: Patient Percentage: 90% Stand to Sit: 4: Min assist;To chair/3-in-1;To toilet Stand to Sit: Patient Percentage: 90% Details for Transfer Assistance:  (cues for hand placement and for turns)       Exercises  Balance     End of Session OT - End of Session Activity Tolerance: Patient tolerated treatment well Patient left: in chair;with family/visitor present;with call bell/phone within reach  GO     Rawn Quiroa 03/25/2012, 12:23 PM

## 2012-03-26 LAB — GLUCOSE, CAPILLARY
Glucose-Capillary: 109 mg/dL — ABNORMAL HIGH (ref 70–99)
Glucose-Capillary: 145 mg/dL — ABNORMAL HIGH (ref 70–99)

## 2012-03-26 MED ORDER — WARFARIN SODIUM 7.5 MG PO TABS
7.5000 mg | ORAL_TABLET | Freq: Once | ORAL | Status: AC
  Administered 2012-03-26: 7.5 mg via ORAL
  Filled 2012-03-26: qty 1

## 2012-03-26 NOTE — Progress Notes (Addendum)
Clinical Child psychotherapist met with pt at bedside to review SNF choices as back up; pt requesting CSW to discuss with "friend" Pallis.  CSW spoke with Pallis and explained a SNF needed to be chosen as a back up choice in case insurance does not approve CIR.  Pallis to call CSW back shortly.  Pallis stated understanding to need to pick SNF choice.    Angelia Mould, MSW, [place] [phone]

## 2012-03-26 NOTE — Progress Notes (Signed)
Physical Therapy Treatment Patient Details Name: Mekhai Venuto MRN: [id number] DOB: Apr 22, 1928 Today's Date: 03/26/2012 Time: 4098-1191 PT Time Calculation (min): 24 min  PT Assessment / Plan / Recommendation Comments on Treatment Session  Appears much improved from admission, but still unsafe with RW and needs assist.  Following commands with/without cues well.    Follow Up Recommendations  CIR     Does the patient have the potential to tolerate intense rehabilitation     Barriers to Discharge        Equipment Recommendations  None recommended by PT    Recommendations for Other Services    Frequency Min 3X/week   Plan Discharge plan remains appropriate;Frequency remains appropriate    Precautions / Restrictions Precautions Precautions: Fall   Pertinent Vitals/Pain sats on RA 92-94%,  EHR  100's    Mobility  Transfers Transfers: Sit to Stand;Stand to Sit Sit to Stand: 4: Min assist;With armrests;From chair/3-in-1;From toilet Sit to Stand: Patient Percentage: 90% Stand to Sit: 4: Min assist;To chair/3-in-1;To toilet Stand to Sit: Patient Percentage: 90% Details for Transfer Assistance: vc's for hand placement and minimal assist to help come or stay forward Ambulation/Gait Ambulation/Gait Assistance: 4: Min assist Ambulation/Gait: Patient Percentage: 80% Ambulation Distance (Feet): 200 Feet Assistive device: Rolling walker Ambulation/Gait Assistance Details: v/tc's for postural checks, better positioning in the RW for safety; infrequent steady assist Gait Pattern: Step-through pattern;Decreased stride length;Decreased step length - right;Decreased step length - left;Trunk flexed Modified Rankin (Stroke Patients Only) Modified Rankin: Moderately severe disability    Exercises     PT Diagnosis:    PT Problem List:   PT Treatment Interventions:     PT Goals Acute Rehab PT Goals Time For Goal Achievement: 04/02/12 Potential to Achieve Goals: Good PT Goal: Sit at Edge  Of Bed - Progress: Progressing toward goal Pt will go Sit to Stand: with supervision PT Goal: Sit to Stand - Progress: Updated due to goal met Pt will go Stand to Sit: with supervision PT Goal: Stand to Sit - Progress: Updated due to goals met PT Transfer Goal: Bed to Chair/Chair to Bed - Progress: Progressing toward goal PT Goal: Stand - Progress: Progressing toward goal Pt will Ambulate: >150 feet;with least restrictive assistive device (min guard assist) PT Goal: Ambulate - Progress: Updated due to goal met  Visit Information  Last PT Received On: 03/26/12 Assistance Needed: +1    Subjective Data  Subjective: I'm about 50% (from my normal)   Cognition  Overall Cognitive Status: Appears within functional limits for tasks assessed/performed Arousal/Alertness: Awake/alert Orientation Level: Appears intact for tasks assessed Behavior During Session: [facility] for tasks performed Current Attention Level: Selective Following Commands: Follows one step commands consistently    Balance  Static Standing Balance Static Standing - Balance Support: During functional activity;Bilateral upper extremity supported Static Standing - Level of Assistance: Other (comment) (min guard assist)  End of Session PT - End of Session Activity Tolerance: Patient limited by fatigue Patient left: in chair;with call bell/phone within reach;with family/visitor present Nurse Communication: Mobility status   GP     Labresha Mellor, Eliseo Gum 03/26/2012, 3:40 PM

## 2012-03-26 NOTE — Progress Notes (Signed)
I met with patient and his girlfriend at bedside. I await decision from insurance for inpt rehab vs snf rehab approval. Doubtful they will approve inpt. SW is also aware.[phone]

## 2012-03-26 NOTE — Progress Notes (Signed)
Speech Language Pathology Dysphagia Treatment Patient Details Name: Terry Robertson MRN: [id number] DOB: 08/31/1928 Today's Date: 03/26/2012 Time: 0945-1000 SLP Time Calculation (min): 15 min  Assessment / Plan / Recommendation Clinical Impression  Pt now demonstrates functional consumption of regular solids without difficulty. Pts respiration WNL, no overt s/s of aspiration observed though pt observed to take large consecutive straw sips. SLP provided education regarding basic aspiration precautions. Pt safe to upgrade to regular diet and continue thin liquids. No SLP f/u required for swallow, SLP will sign off.     Diet Recommendation  Initiate / Change Diet: Regular;Thin liquid    SLP Plan All goals met   Pertinent Vitals/Pain NA   Swallowing Goals  SLP Swallowing Goals Patient will utilize recommended strategies during swallow to increase swallowing safety with: Supervision/safety Swallow Study Goal #2 - Progress: Met  General Temperature Spikes Noted: No Respiratory Status: Supplemental O2 delivered via (comment) Behavior/Cognition: Cooperative;Alert Oral Cavity - Dentition: Adequate natural dentition Patient Positioning: Upright in chair  Oral Cavity - Oral Hygiene     Dysphagia Treatment Treatment focused on: Skilled observation of diet tolerance;Upgraded PO texture trials;Patient/family/caregiver education Family/Caregiver Educated: son Treatment Methods/Modalities: Skilled observation;Differential diagnosis Patient observed directly with PO's: Yes Type of PO's observed: Regular;Thin liquids Liquids provided via: Cup;Straw Type of cueing: Verbal Amount of cueing: Minimal   GO    Harlon Ditty, MA CCC-SLP [phone]  Claudine Mouton 03/26/2012, 10:11 AM

## 2012-03-26 NOTE — Progress Notes (Signed)
Clinical Child psychotherapist received notification from Theresia Bough, MSW, LCSWA that pt's girlfriend-Pallis phoned [facility] and stated [employer] was their SNF choice if insurance does not approve CIR.  CSW notified Tammy at [facility].     Angelia Mould, MSW, [place] [phone]

## 2012-03-26 NOTE — Progress Notes (Signed)
ANTICOAGULATION CONSULT NOTE - Follow Up Consult  Pharmacy Consult for Coumadin Indication: atrial fibrillation  No Known Allergies  Patient Measurements: Height: 5\' 5"  (165.1 cm) Weight: 247 lb 1.6 oz (112.084 kg) IBW/kg (Calculated) : 61.5   Vital Signs: Temp: 98.6 F (37 C) (01/21 0500) BP: 126/69 mmHg (01/21 0500) Pulse Rate: 78  (01/21 0500)  Labs:  Basename 03/26/12 0512 03/25/12 0555 03/24/12 0628 03/23/12 1224  HGB -- 11.0* -- --  HCT -- 34.8* -- --  PLT -- 135* -- --  APTT -- -- -- --  LABPROT 19.9* 23.1* 28.4* --  INR 1.76* 2.15* 2.84* --  HEPARINUNFRC -- -- -- --  CREATININE -- 0.76 0.81 0.92  CKTOTAL -- -- -- --  CKMB -- -- -- --  TROPONINI -- -- -- --    Estimated Creatinine Clearance: 83.7 ml/min (by C-G formula based on Cr of 0.76).  Assessment: Terry Robertson on Coumadin for hx Afib. INR (1.76) is now subtherapeutic. No CBC today, no bleeding noted. INR has been variable throughout admission and have been given lower doses than his home regimen.   Goal of Therapy:  INR 2-3   Plan:  1. Coumadin 7.5mg  (home dose) PO x 1 at noon today (giving early in hopes of getting INR moving) 2. F/u AM INR 3. Consider bridging with a CHADS2 of 5  Lysle Pearl, PharmD, BCPS Pager # [phone] 03/26/2012 10:31 AM

## 2012-03-26 NOTE — Progress Notes (Signed)
[facility] Progress Note  Terry Robertson ZOX:[id number] DOB: [birth year] DOA: 03/16/2012 PCP: Gwynneth Aliment, MD  Assessment/Plan:  Recurrent, breakthrough focal seizure activity with focal motor activity / altered mental status  -Continue Keppra 1000mg  BID; no further seizure. - MRI raised a question of acute lacunar infract in dorsal L thalamus, but was noncommittal to this diagnosis; neurology does not feel there is an acute infarct (Dr Pearlean Brownie commented that his seizures are likely a result of the prior infarct and acute infection and no further inpatient workup are needed from neurology standpoint at this point.)  Acute respiratory failure secondary to seizure and RVR Due to above - do not require intubation during admission - Will continue daily PO lasix; patient with grade 2 diastolic dysfunction at baseline No crackles, good O2 sat on RA  Fever - UTI- Ecoli No further fever. Antibiotics transition to PO to complete tx. Will use keflex for 3 more days base on sensitivity and to avoid to much interaction with coumadin.  Dysphagia Secondary to decondition and acute encephalopathy from seizure and UTI Continue thin liquids and puree diet as recommended by SPT  Chronic afib-  Rate controlled and per telemetry evaluation back into NSR Continue B-blocker and cardizem at current dose On chronic coumadin tx; pharmacy to dose.  DM CBG reasonably controlled at this time - continue SSI  HTN Stable. Continue antihypertensive at current dose.  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 Continue coumadin.  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 Juliette Alcide - JAK2 mutation is negative - plt count was 84 in Oct -  No overt bleeding Latest platelets 135 range and stable. Will monitor  CAD Continue ASA  Deconditioning Follow results of CIR authorization  and acceptance; if not in agreement to go to SNF for rehab; SW helping with back up plan  Hypokalemia:  due to diuresis repleted   Code Status: FULL Disposition Plan: CIR vs SNF  Consultants: Neuro/Stroke Team   Antibiotics: Rocephin 1/15 >>1/19 Keflex 1/19>>1/24  DVT prophylaxis: Coumadin   HPI/Subjective: Afebrile; AAOX3, no acute distress. Good oxygen saturation on room air and patient participated in physical therapy. Significant improvement on his level of activity. Still very weak and without appropriate support at home. Will need skilled nursing facility at discharge, if he can not be accepted by CIR.   Objective: Blood pressure 148/75, pulse 72, temperature 98 F (36.7 C), temperature source Oral, resp. rate 20, height 5\' 5"  (1.651 m), weight 112.084 kg (247 lb 1.6 oz), SpO2 93.00%.  Intake/Output Summary (Last 24 hours) at 03/26/12 1738 Last data filed at 03/26/12 1430  Gross per 24 hour  Intake      0 ml  Output   1200 ml  Net  -1200 ml     Exam: General: NAD, afebrile Lungs: CTA b/l  Cardiovascular: RRR - no rubs, no gallops Abdomen: Nontender, nondistended, soft, bowel sounds positive, no rebound, no ascites, no appreciable mass Extremities: trace B le edema w/o cyanosis or clubbing   Data Reviewed: Basic Metabolic Panel:  Lab 03/25/12 8119 03/24/12 0628 03/23/12 1224 03/22/12 0450 03/21/12 0450  NA 141 137 138 141 146*  K 4.4 3.4* 3.6 3.7 4.2  CL 105 104 103 109 113*  CO2 24 23 23 20 22   GLUCOSE 112* 107* 100* 126* 131*  BUN 15 21 24* 23 27*  CREATININE 0.76 0.81 0.92 0.99 1.16  CALCIUM 9.2 8.5 8.5 8.4 8.7  MG -- -- -- -- --  PHOS -- -- -- -- --   Liver Function Tests:  Lab 03/21/12 0450 03/20/12 0500  AST 45* 31  ALT 17 11  ALKPHOS 42 43  BILITOT 0.5 0.6  PROT 8.9* 9.5*  ALBUMIN 2.5* 2.8*   CBC:  Lab 03/25/12 0555 03/21/12 0450 03/20/12 0500  WBC 4.2 9.9 11.9*  NEUTROABS -- -- --  HGB 11.0* 11.9* 13.1  HCT 34.8* 37.7* 40.4  MCV  82.5 84.2 85.1  PLT 135* 67* 62*   CBG:  Lab 03/26/12 1647 03/26/12 1143 03/26/12 0744 03/25/12 2105 03/25/12 1653  GLUCAP 145* 109* 101* 138* 115*    Recent Results (from the past 240 hour(s))  MRSA PCR SCREENING     Status: Normal   Collection Time   03/16/12  9:13 PM      Component Value Range Status Comment   MRSA by PCR NEGATIVE  NEGATIVE Final   CULTURE, BLOOD (ROUTINE X 2)     Status: Normal   Collection Time   03/16/12  9:35 PM      Component Value Range Status Comment   Specimen Description BLOOD RIGHT ARM   Final    Special Requests BOTTLES DRAWN AEROBIC ONLY 5CC   Final    Culture  Setup Time 03/17/2012 04:04   Final    Culture NO GROWTH 5 DAYS   Final    Report Status 03/23/2012 FINAL   Final   CULTURE, BLOOD (ROUTINE X 2)     Status: Normal   Collection Time   03/16/12  9:50 PM      Component Value Range Status Comment   Specimen Description BLOOD RIGHT HAND   Final    Special Requests BOTTLES DRAWN AEROBIC ONLY 3CC   Final    Culture  Setup Time 03/17/2012 04:04   Final    Culture NO GROWTH 5 DAYS   Final    Report Status 03/23/2012 FINAL   Final   URINE CULTURE     Status: Normal   Collection Time   03/16/12 10:00 PM      Component Value Range Status Comment   Specimen Description URINE, CATHETERIZED   Final    Special Requests Normal   Final    Culture  Setup Time 03/17/2012 04:01   Final    Colony Count NO GROWTH   Final    Culture NO GROWTH   Final    Report Status 03/18/2012 FINAL   Final   INFLUENZA VIRUS AG, A+B (DFA)     Status: Normal   Collection Time   03/18/12 10:10 AM      Component Value Range Status Comment   Influenza Virus A and B Ag REPORT   Final   CULTURE, BLOOD (ROUTINE X 2)     Status: Normal   Collection Time   03/19/12 12:10 PM      Component Value Range Status Comment   Specimen Description BLOOD HAND RIGHT   Final    Special Requests BOTTLES DRAWN AEROBIC AND ANAEROBIC 10CC EACH   Final    Culture  Setup Time 03/19/2012 19:22    Final    Culture NO GROWTH 5 DAYS   Final    Report Status 03/25/2012 FINAL   Final   URINE CULTURE     Status: Normal   Collection Time   03/19/12 12:13 PM      Component Value Range Status Comment  Specimen Description URINE, CATHETERIZED   Final    Special Requests NONE   Final    Culture  Setup Time 03/19/2012 14:03   Final    Colony Count >=100,000 COLONIES/ML   Final    Culture ESCHERICHIA COLI   Final    Report Status 03/21/2012 FINAL   Final    Organism ID, Bacteria ESCHERICHIA COLI   Final   CULTURE, BLOOD (ROUTINE X 2)     Status: Normal   Collection Time   03/19/12 12:18 PM      Component Value Range Status Comment   Specimen Description BLOOD HAND LEFT   Final    Special Requests BOTTLES DRAWN AEROBIC AND ANAEROBIC 10CC EACH   Final    Culture  Setup Time 03/19/2012 19:22   Final    Culture NO GROWTH 5 DAYS   Final    Report Status 03/25/2012 FINAL   Final      Kamau Weatherall [phone] If 7PM-7AM, please contact night-coverage [email or web address] Password [facility] 03/26/2012, 5:38 PM   LOS: 10 days

## 2012-03-27 ENCOUNTER — Inpatient Hospital Stay (HOSPITAL_COMMUNITY)
Admission: RE | Admit: 2012-03-27 | Discharge: 2012-03-30 | DRG: 945 | Disposition: A | Discharge status: Home / Self Care | Payer: Medicare Other | Source: Intra-hospital | Attending: Physical Medicine & Rehabilitation | Admitting: Physical Medicine & Rehabilitation

## 2012-03-27 ENCOUNTER — Encounter (HOSPITAL_COMMUNITY): Payer: Self-pay | Admitting: Physical Medicine and Rehabilitation

## 2012-03-27 DIAGNOSIS — I69998 Other sequelae following unspecified cerebrovascular disease: Secondary | ICD-10-CM | POA: Diagnosis not present

## 2012-03-27 DIAGNOSIS — Z9861 Coronary angioplasty status: Secondary | ICD-10-CM | POA: Diagnosis present

## 2012-03-27 DIAGNOSIS — I10 Essential (primary) hypertension: Secondary | ICD-10-CM | POA: Diagnosis not present

## 2012-03-27 DIAGNOSIS — A498 Other bacterial infections of unspecified site: Secondary | ICD-10-CM | POA: Diagnosis not present

## 2012-03-27 DIAGNOSIS — R29898 Other symptoms and signs involving the musculoskeletal system: Secondary | ICD-10-CM

## 2012-03-27 DIAGNOSIS — G4733 Obstructive sleep apnea (adult) (pediatric): Secondary | ICD-10-CM | POA: Diagnosis not present

## 2012-03-27 DIAGNOSIS — I69928 Other speech and language deficits following unspecified cerebrovascular disease: Secondary | ICD-10-CM

## 2012-03-27 DIAGNOSIS — I2510 Atherosclerotic heart disease of native coronary artery without angina pectoris: Secondary | ICD-10-CM | POA: Diagnosis present

## 2012-03-27 DIAGNOSIS — Z7901 Long term (current) use of anticoagulants: Secondary | ICD-10-CM | POA: Diagnosis not present

## 2012-03-27 DIAGNOSIS — I519 Heart disease, unspecified: Secondary | ICD-10-CM

## 2012-03-27 DIAGNOSIS — Z7982 Long term (current) use of aspirin: Secondary | ICD-10-CM

## 2012-03-27 DIAGNOSIS — G40109 Localization-related (focal) (partial) symptomatic epilepsy and epileptic syndromes with simple partial seizures, not intractable, without status epilepticus: Secondary | ICD-10-CM

## 2012-03-27 DIAGNOSIS — N390 Urinary tract infection, site not specified: Secondary | ICD-10-CM | POA: Diagnosis not present

## 2012-03-27 DIAGNOSIS — Z79899 Other long term (current) drug therapy: Secondary | ICD-10-CM

## 2012-03-27 DIAGNOSIS — R7309 Other abnormal glucose: Secondary | ICD-10-CM | POA: Diagnosis not present

## 2012-03-27 DIAGNOSIS — Z5189 Encounter for other specified aftercare: Principal | ICD-10-CM

## 2012-03-27 DIAGNOSIS — D469 Myelodysplastic syndrome, unspecified: Secondary | ICD-10-CM | POA: Diagnosis present

## 2012-03-27 DIAGNOSIS — R5381 Other malaise: Secondary | ICD-10-CM

## 2012-03-27 DIAGNOSIS — I5189 Other ill-defined heart diseases: Secondary | ICD-10-CM | POA: Diagnosis present

## 2012-03-27 DIAGNOSIS — E119 Type 2 diabetes mellitus without complications: Secondary | ICD-10-CM | POA: Diagnosis present

## 2012-03-27 DIAGNOSIS — I4891 Unspecified atrial fibrillation: Secondary | ICD-10-CM

## 2012-03-27 DIAGNOSIS — R569 Unspecified convulsions: Secondary | ICD-10-CM | POA: Diagnosis present

## 2012-03-27 DIAGNOSIS — I639 Cerebral infarction, unspecified: Secondary | ICD-10-CM

## 2012-03-27 LAB — GLUCOSE, CAPILLARY: Glucose-Capillary: 110 mg/dL — ABNORMAL HIGH (ref 70–99)

## 2012-03-27 MED ORDER — WARFARIN SODIUM 10 MG PO TABS
10.0000 mg | ORAL_TABLET | Freq: Once | ORAL | Status: DC
  Filled 2012-03-27: qty 1

## 2012-03-27 MED ORDER — ACETAMINOPHEN 325 MG PO TABS: 325.0000 mg | ORAL_TABLET | ORAL | Status: DC | PRN

## 2012-03-27 MED ORDER — FUROSEMIDE 20 MG PO TABS
20.0000 mg | ORAL_TABLET | Freq: Every day | ORAL | Status: DC
  Administered 2012-03-28 – 2012-03-30 (×3): 20 mg via ORAL
  Filled 2012-03-27 (×4): qty 1

## 2012-03-27 MED ORDER — GUAIFENESIN-DM 100-10 MG/5ML PO SYRP: 5.0000 mL | ORAL_SOLUTION | Freq: Four times a day (QID) | ORAL | Status: DC | PRN

## 2012-03-27 MED ORDER — ALUM & MAG HYDROXIDE-SIMETH 200-200-20 MG/5ML PO SUSP: 30.0000 mL | ORAL | Status: DC | PRN

## 2012-03-27 MED ORDER — LORAZEPAM 2 MG/ML IJ SOLN: 1.0000 mg | INTRAMUSCULAR | Status: DC | PRN

## 2012-03-27 MED ORDER — FLEET ENEMA 7-19 GM/118ML RE ENEM: 1.0000 | ENEMA | Freq: Once | RECTAL | Status: AC | PRN

## 2012-03-27 MED ORDER — DILTIAZEM HCL ER COATED BEADS 180 MG PO CP24
180.0000 mg | ORAL_CAPSULE | Freq: Every day | ORAL | Status: DC
  Administered 2012-03-28 – 2012-03-30 (×3): 180 mg via ORAL
  Filled 2012-03-27 (×4): qty 1

## 2012-03-27 MED ORDER — ASPIRIN EC 81 MG PO TBEC
81.0000 mg | DELAYED_RELEASE_TABLET | Freq: Every day | ORAL | Status: DC
  Administered 2012-03-28 – 2012-03-29 (×2): 81 mg via ORAL
  Filled 2012-03-27 (×5): qty 1

## 2012-03-27 MED ORDER — WARFARIN - PHARMACIST DOSING INPATIENT: Freq: Every day | Status: DC

## 2012-03-27 MED ORDER — WARFARIN SODIUM 10 MG PO TABS
10.0000 mg | ORAL_TABLET | Freq: Once | ORAL | Status: AC
  Administered 2012-03-27: 10 mg via ORAL
  Filled 2012-03-27: qty 1

## 2012-03-27 MED ORDER — ENSURE COMPLETE PO LIQD
237.0000 mL | Freq: Three times a day (TID) | ORAL | Status: DC
  Administered 2012-03-27 – 2012-03-29 (×7): 237 mL via ORAL

## 2012-03-27 MED ORDER — PROCHLORPERAZINE 25 MG RE SUPP
12.5000 mg | Freq: Four times a day (QID) | RECTAL | Status: DC | PRN
  Filled 2012-03-27: qty 1

## 2012-03-27 MED ORDER — PROCHLORPERAZINE EDISYLATE 5 MG/ML IJ SOLN
5.0000 mg | Freq: Four times a day (QID) | INTRAMUSCULAR | Status: DC | PRN
  Filled 2012-03-27: qty 2

## 2012-03-27 MED ORDER — INSULIN ASPART 100 UNIT/ML ~~LOC~~ SOLN: 0.0000 [IU] | Freq: Three times a day (TID) | SUBCUTANEOUS | Status: DC

## 2012-03-27 MED ORDER — CEPHALEXIN 500 MG PO CAPS
500.0000 mg | ORAL_CAPSULE | Freq: Two times a day (BID) | ORAL | Status: AC
  Administered 2012-03-27 – 2012-03-29 (×5): 500 mg via ORAL
  Filled 2012-03-27 (×5): qty 1

## 2012-03-27 MED ORDER — LEVETIRACETAM 500 MG PO TABS: 1000.0000 mg | ORAL_TABLET | Freq: Two times a day (BID) | ORAL | Status: DC

## 2012-03-27 MED ORDER — BISACODYL 10 MG RE SUPP: 10.0000 mg | Freq: Every day | RECTAL | Status: DC | PRN

## 2012-03-27 MED ORDER — PROCHLORPERAZINE MALEATE 5 MG PO TABS
5.0000 mg | ORAL_TABLET | Freq: Four times a day (QID) | ORAL | Status: DC | PRN
  Filled 2012-03-27: qty 2

## 2012-03-27 MED ORDER — OMEGA-3-ACID ETHYL ESTERS 1 G PO CAPS
2.0000 g | ORAL_CAPSULE | Freq: Two times a day (BID) | ORAL | Status: DC
  Administered 2012-03-27 – 2012-03-30 (×6): 2 g via ORAL
  Filled 2012-03-27 (×8): qty 2

## 2012-03-27 MED ORDER — LEVETIRACETAM 500 MG PO TABS
1000.0000 mg | ORAL_TABLET | Freq: Two times a day (BID) | ORAL | Status: DC
  Administered 2012-03-27 – 2012-03-30 (×6): 1000 mg via ORAL
  Filled 2012-03-27 (×8): qty 2

## 2012-03-27 MED ORDER — POLYETHYLENE GLYCOL 3350 17 G PO PACK
17.0000 g | PACK | Freq: Every day | ORAL | Status: DC | PRN
  Filled 2012-03-27: qty 1

## 2012-03-27 MED ORDER — ATENOLOL 12.5 MG HALF TABLET
12.5000 mg | ORAL_TABLET | Freq: Every day | ORAL | Status: DC
  Administered 2012-03-28 – 2012-03-30 (×3): 12.5 mg via ORAL
  Filled 2012-03-27 (×4): qty 1

## 2012-03-27 NOTE — Progress Notes (Signed)
Rehab admissions - I have approval from [employer] insurance carrier and will admit to inpatient rehab today.  Patient and family are in agreement.  Call me for questions.  #[phone]

## 2012-03-27 NOTE — Progress Notes (Signed)
Patient admitted to 4033 at 61. Fiancee at bedside. Patient alert and oriented x4, denies pain. Patient oriented to unit, rehab schedule, call bell system, and safety plan. Patient verbalized understanding. Hedy Camara

## 2012-03-27 NOTE — Progress Notes (Addendum)
[facility] PROGRESS NOTE  Refoel Palladino ZOX:[id number] DOB: 04-26-1928 DOA: 03/16/2012 PCP: Gwynneth Aliment, MD  Assessment/Plan: 1. Recurrent, breakthrough focal seizure activity with focal motor activity, possible status epilepticus: Stable. No recurrent seizures. Continue Keppra 1000mg  BID 2. Possible acute dorsal left thalamic stroke: Per neurology not felt to be associated with his current symptoms, as he has had significant improvement after seizure treatment. Infarct likely thrombotic secondary to either small vessel disease or embolic given atrial fibrillation. Either way, full workup completed in June 2013 without need to repeat further. Neurology suggested continuing warfarin for secondary stroke prevention as long as platelets remain stable. Continue current seizure treatment 3. Acute respiratory failure secondary to seizure and RVR: Secondary to seizure. Did not require intubation.  4. UTI- Ecoli: Afebrile. Finish antibiotics.  5. Acute Dysphagia: Resolved. Secondary to decondition and acute encephalopathy from seizure and UTI  6. Chronic afib/RVR: Now in sinus rhythm again. Continue B-blocker and cardizem at current dose. On chronic coumadin tx; pharmacy to dose.  7. Grade 2 diastolic dysfunction: Stable. Continue Lasix. 8. DM type II diet controlled: CBG stable. Continue SSI . 9. HTN: Stable. Continue antihypertensive at current dose.  10. Hx of L brain CVA: Chronic right sided weekness and slowed speech/aphasia  11. Hx of MDS manifesting predominantly with mild anemia and neurtropenia: Thrombocytopenia stable. Followed by Dr. Juliette Alcide - JAK2 mutation is negative - plt count was 84 in Oct - No overt bleeding. Latest platelets 135 range and stable.  12. Deconditioning: Follow results of CIR authorization and acceptance; if not in agreement to go to SNF for rehab; SW helping with back up plan   Code Status: Full code Family Communication: Discussed with friend at  bedside Disposition Plan: CIR versus skilled nursing facility. Stable for discharge.  Brendia Sacks, MD  [facility] Team 5 Pager [phone] If 7PM-7AM, please contact night-coverage at [email or web address], password [facility] 03/27/2012, 10:42 AM  LOS: 11 days   Brief narrative: 79 y.o. male with a history of previous left parietal stroke, seizure disorder, hypertension, diabetes mellitus and coronary artery disease, presenting with speech difficulty and reduced movement of his right extremities. Patient arrived in code stroke status. He was noted to have markedly reduced speech output as well as eyes deviated to the right side. He was also noted not to be moving his right side, as well. CT scan of his head showed old left parietal stroke. There were no acute findings. While in radiology the patient had a witnessed focal motor seizure involving his right arm right leg and right side of his face. Eyes were deviated tonically to the right side as well. He was given with 2 mg of Ativan. He's been taking Keppra 500 mg twice a day. A loading dose of Keppra 1 g was ordered to be given IV. Patient's seizure activity stopped following administration of initial dose of Ativan. He remained confused, however. Code stroke was canceled.  Noted to have acute respiratory failure, uncontrolled hypertension, acute encephalopathy and admitted by critical care. Intubation was ultimately not required.   Respiratory status improved with conservative care, mentation improved. Subsequently was transferred to the hospital service. Condition is slowly continue to improve. Treated for UTI.  Consultants:  Admitted by critical care  Neurology  Physical therapy: CIR  Occupational therapy: CIR  Speech therapy: Regular diet, thin liquids  Procedures:  None  Antibiotics:  Ceftriaxone 1/15 >> 1/19  Keflex 1/20 >> 1/22  HPI/Subjective: Afebrile, vital signs stable. Feels better. No complaints.  Objective: Filed  Vitals:   03/26/12 1400 03/26/12 2100 03/27/12 0500 03/27/12 1004  BP: 148/75 147/76 150/71 162/81  Pulse: 72 67 67 70  Temp: 98 F (36.7 C) 98 F (36.7 C) 97.8 F (36.6 C)   TempSrc: Oral     Resp: 20 18 18    Height:      Weight:      SpO2: 93% 93% 91%     Intake/Output Summary (Last 24 hours) at 03/27/12 1042 Last data filed at 03/27/12 0600  Gross per 24 hour  Intake      0 ml  Output   1200 ml  Net  -1200 ml   Filed Weights   03/20/12 0500 03/23/12 1532 03/25/12 0500  Weight: 111.3 kg (245 lb 6 oz) 111.131 kg (245 lb) 112.084 kg (247 lb 1.6 oz)    Exam:  General:  Appears calm and comfortable Cardiovascular: RRR, no m/r/g. No LE edema. Telemetry: SR, no arrhythmias  Respiratory: CTA bilaterally, no w/r/r. Normal respiratory effort. Musculoskeletal: grossly normal tone BUE/BLE Psychiatric: grossly normal mood and affect, speech fluent and appropriate  Data Reviewed: Basic Metabolic Panel:  Lab 03/25/12 0981 03/24/12 0628 03/23/12 1224 03/22/12 0450 03/21/12 0450  NA 141 137 138 141 146*  K 4.4 3.4* 3.6 3.7 4.2  CL 105 104 103 109 113*  CO2 24 23 23 20 22   GLUCOSE 112* 107* 100* 126* 131*  BUN 15 21 24* 23 27*  CREATININE 0.76 0.81 0.92 0.99 1.16  CALCIUM 9.2 8.5 8.5 8.4 8.7  MG -- -- -- -- --  PHOS -- -- -- -- --   Liver Function Tests:  Lab 03/21/12 0450  AST 45*  ALT 17  ALKPHOS 42  BILITOT 0.5  PROT 8.9*  ALBUMIN 2.5*   CBC:  Lab 03/25/12 0555 03/21/12 0450  WBC 4.2 9.9  NEUTROABS -- --  HGB 11.0* 11.9*  HCT 34.8* 37.7*  MCV 82.5 84.2  PLT 135* 67*   CBG:  Lab 03/27/12 0736 03/26/12 2058 03/26/12 1647 03/26/12 1143 03/26/12 0744  GLUCAP 99 107* 145* 109* 101*    Recent Results (from the past 240 hour(s))  INFLUENZA VIRUS AG, A+B (DFA)     Status: Normal   Collection Time   03/18/12 10:10 AM      Component Value Range Status Comment   Influenza Virus A and B Ag REPORT   Final   CULTURE, BLOOD (ROUTINE X 2)     Status: Normal    Collection Time   03/19/12 12:10 PM      Component Value Range Status Comment   Specimen Description BLOOD HAND RIGHT   Final    Special Requests BOTTLES DRAWN AEROBIC AND ANAEROBIC 10CC EACH   Final    Culture  Setup Time 03/19/2012 19:22   Final    Culture NO GROWTH 5 DAYS   Final    Report Status 03/25/2012 FINAL   Final   URINE CULTURE     Status: Normal   Collection Time   03/19/12 12:13 PM      Component Value Range Status Comment   Specimen Description URINE, CATHETERIZED   Final    Special Requests NONE   Final    Culture  Setup Time 03/19/2012 14:03   Final    Colony Count >=100,000 COLONIES/ML   Final    Culture ESCHERICHIA COLI   Final    Report Status 03/21/2012 FINAL   Final    Organism ID, Bacteria ESCHERICHIA COLI   Final  CULTURE, BLOOD (ROUTINE X 2)     Status: Normal   Collection Time   03/19/12 12:18 PM      Component Value Range Status Comment   Specimen Description BLOOD HAND LEFT   Final    Special Requests BOTTLES DRAWN AEROBIC AND ANAEROBIC 10CC EACH   Final    Culture  Setup Time 03/19/2012 19:22   Final    Culture NO GROWTH 5 DAYS   Final    Report Status 03/25/2012 FINAL   Final      Studies: No results found.  Scheduled Meds:   . aspirin EC  81 mg Oral Daily  . atenolol  12.5 mg Oral Daily  . cephALEXin  500 mg Oral Q12H  . diltiazem  180 mg Oral Daily  . feeding supplement  237 mL Oral TID BM  . furosemide  20 mg Oral Daily  . insulin aspart  0-15 Units Subcutaneous TID WC  . levETIRAcetam  1,000 mg Oral BID  . omega-3 acid ethyl esters  2 g Oral BID  . terazosin  2 mg Oral QHS  . warfarin  10 mg Oral ONCE-1800  . Warfarin - Pharmacist Dosing Inpatient   Does not apply q1800   Continuous Infusions:   Principal Problem:  *Seizure, resolved Active Problems:  CAD (coronary artery disease) PCI to circumflex in 1997.  100% occluded RCA  Myelodysplastic syndrome  HTN (hypertension), controlled  DM type 2 (diabetes mellitus, type 2)   Thrombocytopenia, chronic  Hx of AF, NSR presently  Acute respiratory failure, resolved  Altered mental status, suspect post ictal  Fever, unclear etiology  Pulmonary edema     Brendia Sacks, MD  [facility] Team 5 Pager [phone] If 7PM-7AM, please contact night-coverage at [email or web address], password [facility] 03/27/2012, 10:42 AM  LOS: 11 days   Time spent: 35 minutes

## 2012-03-27 NOTE — Discharge Summary (Signed)
Physician Discharge Summary  Ignacio Lowder ZOX:[id number] DOB: Mar 21, 1928 DOA: 03/16/2012  PCP: Gwynneth Aliment, MD  Admit date: 03/16/2012 Discharge date: 03/27/2012  Recommendations for Outpatient Follow-up:  1. CIR for acute rehabilitation needs 2. Continue treatment to prevent further seizures 3. Continue warfarin As directed 4. Completes antibiotics for UTI 1/22    Discharge Diagnoses:  1. Recurrent, breakthrough focal seizure activity with focal motor activity, possible status epilepticus 2. Possible acute dorsal left thalamic stroke 3. Acute respiratory failure secondary to seizure 4. UTI 5. Acute Dysphagia   6. Chronic afib/RVR 7. Grade 2 diastolic dysfunction 8. DM type II diet controlled 9. HTN  10. Hx of L brain CVA 11. Hx of MDS manifesting predominantly with mild anemia and neurtropenia 12. Deconditioning  Discharge Condition: Improved Disposition: CIR  Diet recommendation: Heart healthy diet, thin liquids  Filed Weights   03/20/12 0500 03/23/12 1532 03/25/12 0500  Weight: 111.3 kg (245 lb 6 oz) 111.131 kg (245 lb) 112.084 kg (247 lb 1.6 oz)   History of present illness:  79 y.o. male with a history of previous left parietal stroke, seizure disorder, hypertension, diabetes mellitus and coronary artery disease, presenting with speech difficulty and reduced movement of his right extremities. Marland Kitchen  Hospital Course:  Mr. Krotzer arrived in code stroke status. He was noted to have markedly reduced speech output as well as eyes deviated to the right side. He was also noted not to be moving his right side, as well. CT scan of his head showed old left parietal stroke. There were no acute findings. While in radiology the patient had a witnessed focal motor seizure involving his right arm right leg and right side of his face. Eyes were deviated tonically to the right side as well. Patient's seizure activity stopped following administration of initial dose of Ativan. He remained  confused, however. Code stroke was canceled.  Noted to have acute respiratory failure, uncontrolled hypertension, acute encephalopathy and admitted by critical care. Intubation was ultimately not required.   Respiratory status improved with conservative care, mentation improved. No recurrent seizures. Subsequently was transferred to the hospitalist service. Condition continues to improve. Treated for UTI. Dysphagia resolved. The question of a possible acute stroke was raised, full details below. Other issues that to below. Given patient's improvement he appears clinically stable for transfer to inpatient rehabilitation.  1. Recurrent, breakthrough focal seizure activity with focal motor activity, possible status epilepticus: Stable. No recurrent seizures. Continue Keppra 1000mg  BID  2. Possible acute dorsal left thalamic stroke: Per neurology not felt to be associated with his current symptoms, as he has had significant improvement after seizure treatment. Infarct likely thrombotic secondary to either small vessel disease or embolic given atrial fibrillation. Either way, full workup completed in June 2013 without need to repeat further. Neurology suggested continuing warfarin for secondary stroke prevention as long as platelets remain stable. Continue current seizure treatment  3. Acute respiratory failure secondary to seizure and RVR: Secondary to seizure. Did not require intubation.    4. UTI- Ecoli: Afebrile. Finish antibiotics.   5. Acute Dysphagia: Resolved. Secondary to decondition and acute encephalopathy from seizure and UTI    6. Chronic afib/RVR: In sinus rhythm. Continue B-blocker and cardizem at current dose. On chronic coumadin tx; pharmacy to dose.    7. Grade 2 diastolic dysfunction: Stable.  8. DM type II diet controlled: CBG stable with sliding scale insulin  9. HTN: Stable. Continue antihypertensive at current dose.    10. Hx of L brain CVA:  Chronic right sided weekness and slowed  speech/aphasia    11. Hx of MDS manifesting predominantly with mild anemia and neurtropenia: Thrombocytopenia stable. Followed by Dr. Juliette Alcide - JAK2 mutation is negative - plt count was 84 in Oct - No overt bleeding. Latest platelets 135 range and stable.    12. Deconditioning: CIR  Consultants:  Admitted by critical care   Neurology   Physical therapy: CIR   Occupational therapy: CIR  Speech therapy: Regular diet, thin liquids  Procedures:  None  Antibiotics:  Ceftriaxone 1/15 >> 1/19   Keflex 1/20 >> 1/22   Discharge Instructions  Discharge Orders    Future Appointments: Provider: Department: Dept Phone: Center:   03/28/2012 8:00 AM Leone Brand, PT [facility]  [facility] INPATIENT  REHAB [phone] None   03/28/2012 10:30 AM Melonie Florida, OT [facility]  4000 INPATIENT  REHAB [phone] None   03/28/2012 1:30 PM Huston Foley, CCC-SLP [facility]  4000 INPATIENT  REHAB [phone] None   03/28/2012 2:30 PM Delon Sacramento, OT [facility]  4000 INPATIENT  REHAB [phone] None   07/02/2012 11:30 AM Krista Blue [facility] MEDICAL ONCOLOGY [phone] None   07/02/2012 12:00 PM Benjiman Core, MD [facility] MEDICAL ONCOLOGY [phone] None       Medication List     As of 03/27/2012  4:45 PM    TAKE these medications         aspirin EC 81 MG tablet   Take 81 mg by mouth daily.      atenolol 12.5 mg Tabs   Commonly known as: TENORMIN   Take 0.5 tablets (12.5 mg total) by mouth daily.      diltiazem 180 MG 24 hr capsule   Commonly known as: CARDIZEM CD   Take 1 capsule (180 mg total) by mouth daily.      levETIRAcetam 500 MG tablet   Commonly known as: KEPPRA   Take 2 tablets (1,000 mg total) by mouth 2 (two) times daily.      lisinopril 10 MG tablet   Commonly known as: PRINIVIL,ZESTRIL   Take 1 tablet (10 mg total) by mouth daily.       omega-3 acid ethyl esters 1 G capsule   Commonly known as: LOVAZA   Take 2 g by mouth 2 (two) times daily.      simvastatin 20 MG tablet   Commonly known as: ZOCOR   Take 1 tablet (20 mg total) by mouth daily.      terazosin 2 MG capsule   Commonly known as: HYTRIN   Take 2 mg by mouth daily.      Vitamin D (Ergocalciferol) 50000 UNITS Caps   Commonly known as: DRISDOL   Take 50,000 Units by mouth 2 (two) times a week. Takes 1 capsule on Wednesday and Saturday every week      warfarin 5 MG tablet   Commonly known as: COUMADIN   Take 7.5-10 mg by mouth daily. Takes 10mg  on Monday, Wednesday, and Friday. Takes 1.5 tablets on Sunday, Tuesday, Thursday, and Saturday         The results of significant diagnostics from this hospitalization (including imaging, microbiology, ancillary and laboratory) are listed below for reference.    Significant Diagnostic Studies: Ct Head Wo Contrast  03/16/2012  *RADIOLOGY REPORT*  Clinical Data: Code stroke.  Aphasia and confusion.  CT HEAD WITHOUT CONTRAST  Technique:  Contiguous axial images were obtained from the base of the skull through the vertex without contrast.  Comparison: 11/29/2011  Findings: Encephalomalacia within the left parietal lobe is similar to previous MRI.  There is prominence of the sulci and ventricles consistent with brain atrophy.  Chronic right basal ganglia lacunar infarcts are again identified.  There is no evidence for acute brain infarct, hemorrhage or mass.  There is evidence of old trauma to the right orbit.  Mild mucosal thickening is identified involving the right maxillary sinus.  The skull is intact.  IMPRESSION:  1.  No acute intracranial abnormalities. 2.  Remote left parietal lobe infarct.   Original Report Authenticated By: Signa Kell, M.D.    Mr Brain Wo Contrast  03/16/2012  *RADIOLOGY REPORT*  Clinical Data: 79 year old male.  Seizures.  MRI HEAD WITHOUT CONTRAST  Technique:  Multiplanar, multiecho pulse  sequences of the brain and surrounding structures were obtained according to standard protocol without intravenous contrast.  Comparison: Head CT 03/16/2012.  Brain MRI 11/29/2011.  Findings: Stable cerebral volume.  Stable left parietal and posterior frontal operculum encephalomalacia.  No ventriculomegaly. Chronic micro hemorrhage in the left thalamus is more apparent but probably stable.  Chronic deep gray matter nuclei lacunar infarcts and/or dilated perivascular spaces appear stable on T2 imaging. No acute intracranial hemorrhage identified.  Major intracranial vascular flow voids are stable.  .  Subtle increased trace diffusion signal in the dorsal left thalamus.  No associated T2 were FLAIR hyperintensity.  Diffusion elsewhere is within normal limits.  Stable pituitary.  Grossly stable cervicomedullary junction and visualized cervical spine.  Normal bone marrow signal.  Stable visualized orbit soft tissues.  Chronic right lamina papyracea fracture.  Mildly increased paranasal sinus mucosal thickening.  Mastoids remain clear.  Negative scalp soft tissues.  IMPRESSION: 1.  Difficult to exclude acute lacunar infarct in the dorsal left thalamus, but this may be artifact as there is no associated new T2 or FLAIR signal abnormality. 2.  No other acute intracranial abnormality.  Chronic posterior left MCA infarct.  Chronic deep gray matter small vessel ischemia.   Original Report Authenticated By: Erskine Speed, M.D.    Dg Chest Port 1 View  03/21/2012  *RADIOLOGY REPORT*  Clinical Data: Focal infiltrate.  PORTABLE CHEST - 1 VIEW  Comparison: 03/19/2012.  Findings: Cardiomegaly.  Pulmonary vascular congestion.  Bilateral basilar atelectasis.  Aortic arch atherosclerosis.  The patient mildly rotated to the left.  Compared to 03/19/2012, there is little interval change.  IMPRESSION: No interval change.  Persistent cardiomegaly, basilar atelectasis and pulmonary vascular congestion.   Original Report Authenticated By:  Andreas Newport, M.D.      Microbiology: Recent Results (from the past 240 hour(s))  INFLUENZA VIRUS AG, A+B (DFA)     Status: Normal   Collection Time   03/18/12 10:10 AM      Component Value Range Status Comment   Influenza Virus A and B Ag REPORT   Final   CULTURE, BLOOD (ROUTINE X 2)     Status: Normal   Collection Time   03/19/12 12:10 PM      Component Value Range Status Comment   Specimen Description BLOOD HAND RIGHT   Final    Special Requests BOTTLES DRAWN AEROBIC AND ANAEROBIC [facility]   Final    Culture  Setup Time 03/19/2012 19:22   Final    Culture NO GROWTH 5 DAYS   Final    Report Status 03/25/2012 FINAL   Final  URINE CULTURE     Status: Normal   Collection Time   03/19/12 12:13 PM      Component Value Range Status Comment   Specimen Description URINE, CATHETERIZED   Final    Special Requests NONE   Final    Culture  Setup Time 03/19/2012 14:03   Final    Colony Count >=100,000 COLONIES/ML   Final    Culture ESCHERICHIA COLI   Final    Report Status 03/21/2012 FINAL   Final    Organism ID, Bacteria ESCHERICHIA COLI   Final   CULTURE, BLOOD (ROUTINE X 2)     Status: Normal   Collection Time   03/19/12 12:18 PM      Component Value Range Status Comment   Specimen Description BLOOD HAND LEFT   Final    Special Requests BOTTLES DRAWN AEROBIC AND ANAEROBIC 10CC EACH   Final    Culture  Setup Time 03/19/2012 19:22   Final    Culture NO GROWTH 5 DAYS   Final    Report Status 03/25/2012 FINAL   Final      Labs: Basic Metabolic Panel:  Lab 03/25/12 9147 03/24/12 0628 03/23/12 1224 03/22/12 0450 03/21/12 0450  NA 141 137 138 141 146*  K 4.4 3.4* 3.6 3.7 4.2  CL 105 104 103 109 113*  CO2 24 23 23 20 22   GLUCOSE 112* 107* 100* 126* 131*  BUN 15 21 24* 23 27*  CREATININE 0.76 0.81 0.92 0.99 1.16  CALCIUM 9.2 8.5 8.5 8.4 8.7  MG -- -- -- -- --  PHOS -- -- -- -- --   Liver Function Tests:  Lab 03/21/12 0450  AST 45*  ALT 17  ALKPHOS 42  BILITOT 0.5  PROT  8.9*  ALBUMIN 2.5*   CBC:  Lab 03/25/12 0555 03/21/12 0450  WBC 4.2 9.9  NEUTROABS -- --  HGB 11.0* 11.9*  HCT 34.8* 37.7*  MCV 82.5 84.2  PLT 135* 67*   CBG:  Lab 03/27/12 1146 03/27/12 0736 03/26/12 2058 03/26/12 1647 03/26/12 1143  GLUCAP 110* 99 107* 145* 109*    Principal Problem:  *Seizure, resolved Active Problems:  CAD (coronary artery disease) PCI to circumflex in 1997.  100% occluded RCA  Myelodysplastic syndrome  HTN (hypertension), controlled  DM type 2 (diabetes mellitus, type 2)  Thrombocytopenia, chronic  Hx of AF, NSR presently  Acute respiratory failure, resolved  Altered mental status, suspect post ictal  Fever, unclear etiology  Pulmonary edema   Time coordinating discharge: 40 minutes  Signed:  Brendia Sacks, MD [facility] 03/27/2012, 4:45 PM

## 2012-03-27 NOTE — Progress Notes (Signed)
Rehab admissions - Patient did much better with PT today requiring only min guard assist to amb 250 ft.  I anticipate getting a denial from [employer], but they have not called me yet with decision.  Will update all when I hear back from insurance case manager.  #[phone]

## 2012-03-27 NOTE — Progress Notes (Signed)
ANTICOAGULATION CONSULT NOTE - Follow Up Consult  Pharmacy Consult for Coumadin Indication: atrial fibrillation  No Known Allergies  Patient Measurements: Height: 5\' 5"  (165.1 cm) Weight: 247 lb 1.6 oz (112.084 kg) IBW/kg (Calculated) : 61.5   Vital Signs: Temp: 97.8 F (36.6 C) (01/22 0500) BP: 150/71 mmHg (01/22 0500) Pulse Rate: 67  (01/22 0500)  Labs:  Basename 03/27/12 0602 03/26/12 0512 03/25/12 0555  HGB -- -- 11.0*  HCT -- -- 34.8*  PLT -- -- 135*  APTT -- -- --  LABPROT 17.8* 19.9* 23.1*  INR 1.51* 1.76* 2.15*  HEPARINUNFRC -- -- --  CREATININE -- -- 0.76  CKTOTAL -- -- --  CKMB -- -- --  TROPONINI -- -- --    Estimated Creatinine Clearance: 83.7 ml/min (by C-G formula based on Cr of 0.76).  Assessment: 83yom on Coumadin for hx Afib. INR (1.51) is now subtherapeutic. INR cont to trend down so will increase dose of coumadin today.   Goal of Therapy:  INR 2-3   Plan:  1. Coumadin 10mg  (home dose) PO x 1 2. F/u AM INR 3. Consider bridging with a CHADS2 of 5

## 2012-03-27 NOTE — Plan of Care (Signed)
Overall Plan of Care [facility]) Patient Details Name: Terry Robertson MRN: [id number] DOB: [birth year]  Diagnosis:  Left thalamic infarct  Co-morbidities: seizure, dm, htn,    Functional Problem List  Patient demonstrates impairments in the following areas: Balance, Bladder, Bowel, Endurance, Medication Management, Motor, Nutrition, Pain and Skin Integrity  Basic ADL's: grooming, bathing, dressing and toileting Advanced ADL's: simple meal preparation  Transfers:  bed mobility, bed to chair, toilet, tub/shower, car and furniture Locomotion:  ambulation and stairs  Additional Impairments:  Communication  comprehension and expression  Anticipated Outcomes Item Anticipated Outcome  Eating/Swallowing  Independent   Basic self-care    Tolieting    Bowel/Bladder  Continent mod independence  Transfers  Mod I  Locomotion  Mod I  Communication  Min A with mildly complex  Cognition  At baseline per family   Pain  No c/o pain   Safety/Judgment  At baseline per family  Other  Skin: no new breakdown    Therapy Plan: PT Intensity: Minimum of 1-2 x/day ,45 to 90 minutes PT Frequency: 5 out of 7 days PT Duration Estimated Length of Stay: 2-3 days OT Intensity: Minimum of 1-2 x/day, 45 to 90 minutes OT Duration/Estimated Length of Stay: 3-4 days SLP Intensity: Minumum of 1-2 x/day, 30 to 90 minutes SLP Frequency: 5 out of 7 days SLP Duration/Estimated Length of Stay: 1 week    Team Interventions: Item RN PT OT SLP SW TR Other  Self Care/Advanced ADL Retraining   x      Neuromuscular Re-Education  x x      Therapeutic Activities  x x x     UE/LE Strength Training/ROM  x x      UE/LE Coordination Activities  x x      Visual/Perceptual Remediation/Compensation         DME/Adaptive Equipment Instruction  x x      Therapeutic Exercise  x x      Balance/Vestibular Training  x x      Patient/Family Education x x x x     Cognitive Remediation/Compensation         Functional Mobility  Training  x x      Ambulation/Gait Training  x       Museum/gallery curator  x       Wheelchair Propulsion/Positioning  x       Functional Tourist information centre manager Reintegration   x      Dysphagia/Aspiration Landscape architect Facilitation    x     Bladder Management x        Bowel Management x        Disease Management/Prevention x        Pain Management x x       Medication Management x        Skin Care/Wound Management x        Splinting/Orthotics  x       Discharge Planning  x x x     Psychosocial Support   x x                            Team Discharge Planning: Destination: PT-Home ,OT- Home , SLP-Home Projected Follow-up: PT-Home health PT, OT-  Home health OT, SLP-24 hour supervision/assistance Projected Equipment Needs: PT-Rolling walker with 5" wheels, OT-  , SLP-None recommended by SLP Patient/family involved  in discharge planning: PT- Patient,  OT-Patient, SLP-Patient  MD ELOS: 4 days Medical Rehab Prognosis:  Excellent Assessment: The patient has been admitted for CIR therapies. The team will be addressing, functional mobility, strength, stamina, balance, safety, adaptive techniques/equipment, self-care, bowel and bladder mgt, patient and caregiver education, NMR, communication, cognition. Goals have been set at mod I with basic mobility, min assist to supervision to communication/cognition.      See Team Conference Notes for weekly updates to the plan of care

## 2012-03-27 NOTE — Progress Notes (Signed)
Physical Therapy Treatment Patient Details Name: Terry Robertson MRN: [id number] DOB: 04-24-1928 Today's Date: 03/27/2012 Time: 4098-1191 PT Time Calculation (min): 25 min  PT Assessment / Plan / Recommendation Comments on Treatment Session  Assisted pt OOB to amb in hallway twice needing one sitting rest break.  Pt demon limited activity tolerance and requires rest breaks.    Follow Up Recommendations        Does the patient have the potential to tolerate intense rehabilitation     Barriers to Discharge        Equipment Recommendations  None recommended by PT    Recommendations for Other Services    Frequency Min 3X/week   Plan Discharge plan remains appropriate    Precautions / Restrictions Precautions Precautions: Fall Precaution Comments: Hx Sz Restrictions Weight Bearing Restrictions: No   Pertinent Vitals/Pain No c/o pain    Mobility  Bed Mobility Bed Mobility: Supine to Sit Supine to Sit: 4: Min assist;With rails;HOB elevated Sitting - Scoot to Edge of Bed: 4: Min guard (increased time) Details for Bed Mobility Assistance: increased time and use of rail Transfers Transfers: Sit to Stand;Stand to Sit Sit to Stand: 4: Min assist;From bed;From chair/3-in-1 Stand to Sit: 4: Min assist;To chair/3-in-1;To toilet Details for Transfer Assistance: 25% VC's on proper hand placement esp with stand to sit Ambulation/Gait Ambulation/Gait Assistance: 4: Min guard;4: Min assist Ambulation Distance (Feet): 250 Feet (125' x 2 with one sitting rest break) Assistive device: Rolling walker Ambulation/Gait Assistance Details: Pt demon poor forward flex posture due to "bad back" with inability to stand upright.  RA 94% and HR 85 pt states he feels better. Gait Pattern: Step-through pattern;Decreased stride length;Trunk flexed Gait velocity: decreased     PT Goals                                                       progressing    Visit Information  Last PT Received On:  03/27/12 Assistance Needed: +1    Subjective Data  Subjective: I feel a little better Patient Stated Goal: home   Cognition       Balance     End of Session PT - End of Session Equipment Utilized During Treatment: Gait belt Activity Tolerance: Patient tolerated treatment well Patient left: in chair;with call bell/phone within reach   Felecia Shelling  PTA [facility]  Acute  Rehab Pager     [phone]

## 2012-03-27 NOTE — Progress Notes (Signed)
Clinical Social Worker received notification that pt has been approved and accepted for CIR.  CSW to sign off. Please re consult if needed.   Angelia Mould, MSW, [place] [phone]

## 2012-03-27 NOTE — Progress Notes (Signed)
Clinical Child psychotherapist spoke with Conservation officer, nature at [employer].  Steward Drone is currently reviewing pt for dc planning.  Steward Drone to phone CSW back with answer.   Angelia Mould, MSW, [place] [phone]

## 2012-03-27 NOTE — Progress Notes (Signed)
ANTICOAGULATION CONSULT NOTE - Follow Up Consult  Pharmacy Consult for Coumadin (New Rehab Admit) Indication: atrial fibrillation  No Known Allergies  Patient Measurements:    Vital Signs: Temp: 99.6 F (37.6 C) (01/22 1400) Temp src: Oral (01/22 1400) BP: 137/76 mmHg (01/22 1400) Pulse Rate: 68  (01/22 1400)  Labs:  Basename 03/27/12 0602 03/26/12 0512 03/25/12 0555  HGB -- -- 11.0*  HCT -- -- 34.8*  PLT -- -- 135*  APTT -- -- --  LABPROT 17.8* 19.9* 23.1*  INR 1.51* 1.76* 2.15*  HEPARINUNFRC -- -- --  CREATININE -- -- 0.76  CKTOTAL -- -- --  CKMB -- -- --  TROPONINI -- -- --    The CrCl is unknown because both a height and weight (above a minimum accepted value) are required for this calculation.  Assessment: 83yom on Coumadin for hx Afib. INR (1.51) is now subtherapeutic. INR cont to trend down so will increase dose of coumadin today. Last CBC was ok. No repeat since 1/20.   Goal of Therapy:  INR 2-3   Plan:  1. Continue Coumadin 10mg  (home dose) PO x 1 tonight 2. F/u AM INR (daily while on therapy) 3. Consider bridging with a CHADS2 of 5  Link Snuffer, PharmD, BCPS Clinical Pharmacist [phone] 03/27/2012, 5:21 PM

## 2012-03-27 NOTE — Progress Notes (Signed)
Pt transferred/discharged to CIR per MD order. Report given to RN on 4000.  Pt IV removed prior to transfer.  Pt transferred with condom catheter intact. Pt transferred to 4000 via wheelchair by nurse tech. Pt alert and oriented at time of discharge with no complaints of pain. VSS.  Terry Robertson

## 2012-03-28 ENCOUNTER — Inpatient Hospital Stay (HOSPITAL_COMMUNITY): Payer: Medicare Other | Admitting: Speech Pathology

## 2012-03-28 ENCOUNTER — Encounter (HOSPITAL_COMMUNITY): Payer: Medicare Other | Admitting: Occupational Therapy

## 2012-03-28 ENCOUNTER — Inpatient Hospital Stay (HOSPITAL_COMMUNITY): Payer: Medicare Other | Admitting: Physical Therapy

## 2012-03-28 ENCOUNTER — Inpatient Hospital Stay (HOSPITAL_COMMUNITY): Payer: Medicare Other | Admitting: Occupational Therapy

## 2012-03-28 DIAGNOSIS — R569 Unspecified convulsions: Secondary | ICD-10-CM

## 2012-03-28 DIAGNOSIS — I639 Cerebral infarction, unspecified: Secondary | ICD-10-CM

## 2012-03-28 DIAGNOSIS — I6330 Cerebral infarction due to thrombosis of unspecified cerebral artery: Secondary | ICD-10-CM

## 2012-03-28 DIAGNOSIS — I10 Essential (primary) hypertension: Secondary | ICD-10-CM

## 2012-03-28 LAB — GLUCOSE, CAPILLARY
Glucose-Capillary: 92 mg/dL (ref 70–99)
Glucose-Capillary: 94 mg/dL (ref 70–99)

## 2012-03-28 LAB — COMPREHENSIVE METABOLIC PANEL
Albumin: 2.8 g/dL — ABNORMAL LOW (ref 3.5–5.2)
BUN: 13 mg/dL (ref 6–23)
Chloride: 102 mEq/L (ref 96–112)
Creatinine, Ser: 0.84 mg/dL (ref 0.50–1.35)
Total Bilirubin: 0.3 mg/dL (ref 0.3–1.2)
Total Protein: 8.9 g/dL — ABNORMAL HIGH (ref 6.0–8.3)

## 2012-03-28 LAB — CBC WITH DIFFERENTIAL/PLATELET
Basophils Relative: 0 % (ref 0–1)
Eosinophils Absolute: 0 10*3/uL (ref 0.0–0.7)
HCT: 34.3 % — ABNORMAL LOW (ref 39.0–52.0)
Hemoglobin: 10.9 g/dL — ABNORMAL LOW (ref 13.0–17.0)
MCH: 26.4 pg (ref 26.0–34.0)
MCHC: 31.8 g/dL (ref 30.0–36.0)
MCV: 83.1 fL (ref 78.0–100.0)
Monocytes Absolute: 1.5 10*3/uL — ABNORMAL HIGH (ref 0.1–1.0)
Monocytes Relative: 35 % — ABNORMAL HIGH (ref 3–12)

## 2012-03-28 LAB — PROTIME-INR
INR: 1.52 — ABNORMAL HIGH (ref 0.00–1.49)
Prothrombin Time: 17.9 seconds — ABNORMAL HIGH (ref 11.6–15.2)

## 2012-03-28 MED ORDER — WARFARIN SODIUM 10 MG PO TABS
10.0000 mg | ORAL_TABLET | Freq: Once | ORAL | Status: AC
  Administered 2012-03-28: 10 mg via ORAL
  Filled 2012-03-28: qty 1

## 2012-03-28 NOTE — H&P (Signed)
Physical Medicine and Rehabilitation Admission H&P  Chief Complaint   Patient presents with   .  Recurrent seizures.   HPI: Terry Robertson is a 79 y.o. 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 He was admitted on 03/16/12 with markedly reduced speech output as well as eyes deviated to the right side. He was also noted not to be moving his right side, as well. CT scan of his head showed old left parietal stroke. There were no acute findings. While in radiology the patient had a witnessed focal motor seizure involving his right arm right leg and right side of his face. Eyes were deviated tonically to the right side as well. He was given with 2 mg of Ativan. He's been taking Keppra 500 mg twice a day. A loading dose of Keppra 1 g was ordered to be given IV. Patient's seizure activity stopped following administration of initial dose of Ativan. He remained confused. MRI of brain with difficulty excluding acute infarct in dorsal left thalamus. Neurology consulted and recommended increasing Keppra to 1000 mg bid. Neurology feels that stroke not associated with current symptoms and likely thrombotic. To continue coumadin and no further work up needed. PT evaluation done and patient was noted to have difficulty following commands as well as problems with motor planning. He was noted to be febrile on 01/15 and was started on IV rocephin for E coli UTI. Therapies ongoing and patient noted to be deconditioned with poor safety. Encephalopathy resolving. Therapy team recommended CIR level therapies and patient admitted today for follow up therapy.  Review of Systems  HENT: Negative for hearing loss.  Eyes: Negative for double vision.  Respiratory: Negative for cough and shortness of breath.  Cardiovascular: Negative for chest pain and palpitations.  Gastrointestinal: Negative for heartburn and nausea.    Genitourinary: Negative for dysuria and urgency.  Nocturia. Condom cath came off last night Neurological: Negative for dizziness, tingling and headaches.  Psychiatric/Behavioral: The patient has insomnia (usually up and down at nights. ).   Past Medical History   Diagnosis  Date   .  Coronary artery disease    .  Diabetes mellitus    .  Hypertension    .  Stroke    .  Seizures     History reviewed. No pertinent past surgical history.  No family history on file.  Social History: Lives with family. Has a girlfriend who assists with ADLs, meals, etc. Per reports that he quit smoking about 24 years ago. His smoking use included Cigarettes. He does not have any smokeless tobacco history on file. He reports that he does not drink alcohol or use illicit drugs.  Allergies: No Known Allergies  Medications Prior to Admission   Medication  Sig  Dispense  Refill   .  aspirin EC 81 MG tablet  Take 81 mg by mouth daily.     Marland Kitchen  atenolol (TENORMIN) 12.5 mg TABS  Take 0.5 tablets (12.5 mg total) by mouth daily.  303 tablet  5   .  diltiazem (CARDIZEM CD) 180 MG 24 hr capsule  Take 1 capsule (180 mg total) by mouth daily.  30 capsule  5   .  levETIRAcetam (KEPPRA) 500 MG tablet  Take 1 tablet (500 mg total) by mouth 2 (two) times daily.  60 tablet  1   .  lisinopril (PRINIVIL,ZESTRIL) 10 MG tablet  Take 1 tablet (10 mg  total) by mouth daily.  303 tablet  5   .  omega-3 acid ethyl esters (LOVAZA) 1 G capsule  Take 2 g by mouth 2 (two) times daily.     .  simvastatin (ZOCOR) 20 MG tablet  Take 1 tablet (20 mg total) by mouth daily.  30 tablet  0   .  terazosin (HYTRIN) 2 MG capsule  Take 2 mg by mouth daily.     .  Vitamin D, Ergocalciferol, (DRISDOL) 50000 UNITS CAPS  Take 50,000 Units by mouth 2 (two) times a week. Takes 1 capsule on Wednesday and Saturday every week     .  warfarin (COUMADIN) 5 MG tablet  Take 7.5-10 mg by mouth daily. Takes 10mg  on Monday, Wednesday, and Friday. Takes 1.5 tablets on  Sunday, Tuesday, Thursday, and Saturday      Home:  Home Living  Lives With: Son (Lives with 3 sons. 2 of the sons work.)  Available Help at Discharge: Available 24 hours/day  Type of Home: House  Home Access: Stairs to enter  [employer] of Steps: 2  Entrance Stairs-Rails: None  Home Layout: Two level  Bathroom Shower/Tub: Walk-in shower;Door  Engineer, water: Yes  Functional History:  Prior Function  Bath: Supervision/set-up  Dressing: Supervision/set-up  Meal Prep: Minimal  Light Housekeeping: Minimal  Able to Take Stairs?: Yes  Driving: Yes  Vocation: Retired  Functional Status:  Mobility:  Bed Mobility  Bed Mobility: Supine to Sit  Rolling Right: 3: Mod assist;With rail  Right Sidelying to Sit: 3: Mod assist;With rails  Supine to Sit: 4: Min assist;With rails;HOB elevated  Supine to Sit: Patient Percentage: 80%  Sitting - Scoot to [place] of Bed: 4: Min guard (increased time)  Transfers  Transfers: Sit to Stand;Stand to Sit  Sit to Stand: 4: Min assist;From bed;From chair/3-in-1  Sit to Stand: Patient Percentage: 90%  Stand to Sit: 4: Min assist;To chair/3-in-1;To toilet  Stand to Sit: Patient Percentage: 90%  Stand Pivot Transfers: 1: +2 Total assist  Stand Pivot Transfers: Patient Percentage: 40%  Ambulation/Gait  Ambulation/Gait Assistance: 4: Min guard;4: Min assist  Ambulation/Gait: Patient Percentage: 80%  Ambulation Distance (Feet): 250 Feet (125' x 2 with one sitting rest break)  Assistive device: Rolling walker  Ambulation/Gait Assistance Details: Pt demon poor forward flex posture due to "bad back" with inability to stand upright. RA 94% and HR 85 pt states he feels better.  Gait Pattern: Step-through pattern;Decreased stride length;Trunk flexed  Gait velocity: decreased  Stairs: No  Wheelchair Mobility  Wheelchair Mobility: No  ADL:  ADL  Grooming: Performed;Wash/dry hands;Wash/dry face  Where Assessed -  Grooming: Unsupported standing  Lower Body Bathing: +2 Total assistance  Where Assessed - Lower Body Bathing: Supported sit to stand (total (A) for peri care )  Lower Body Dressing: Performed;Moderate assistance  Where Assessed - Lower Body Dressing: Unsupported sitting;Unsupported standing  Toilet Transfer: Performed;Minimal assistance  Toilet Transfer Method: Stand pivot  Acupuncturist: Comfort height toilet;Grab bars  Equipment Used: Rolling walker  Transfers/Ambulation Related to ADLs: Pt. required cues for proper hand placement  ADL Comments: (Pt. would benefit from AE training)  Cognition:  Cognition  Arousal/Alertness: Awake/alert  Orientation Level: Oriented X4  Cognition  Overall Cognitive Status: Appears within functional limits for tasks assessed/performed  Area of Impairment: Memory  Arousal/Alertness: Awake/alert  Orientation Level: Appears intact for tasks assessed  Behavior During Session: [facility] for tasks performed  Current Attention Level: Selective  Memory: (  unable to assess)  Following Commands: Follows one step commands consistently  Safety/Judgement: Impulsive  Safety/Judgement - Other Comments: (cues for hand placement and to make safe turns.)  Problem Solving: Pt slow to process and question motor planning deficits.  Blood pressure 162/81, pulse 70, temperature 97.8 F (36.6 C), temperature source Oral, resp. rate 18, height 5\' 5"  (1.651 m), weight 112.084 kg (247 lb 1.6 oz), SpO2 91.00%.  Physical Exam  Nursing note and vitals reviewed.  Constitutional: He is oriented to person, place, and time. He appears well-developed and well-nourished.  HENT:  Head: Normocephalic and atraumatic.  Cardiovascular: Normal rate and regular rhythm.  Pulmonary/Chest: Effort normal and breath sounds normal.  Abdominal: Soft. He exhibits no distension.  Musculoskeletal: He exhibits no edema.  Neurological: He is alert and oriented to person, place, and time.    Follows basic commands without difficulty. Mild expressive deficits noted. Decreased fine motor control RUE.   Results for orders placed during the hospital encounter of 03/16/12 (from the past 48 hour(s))   PROTIME-INR Status: Abnormal    Collection Time    03/27/12 6:02 AM   Component  Value  Range  Comment    Prothrombin Time  17.8 (*)  11.6 - 15.2 seconds     INR  1.51 (*)  0.00 - 1.49    GLUCOSE, CAPILLARY Status: Normal    Collection Time    03/27/12 7:36 AM   Component  Value  Range  Comment    Glucose-Capillary  99  70 - 99 mg/dL     Comment 1  Notify RN     GLUCOSE, CAPILLARY Status: Abnormal    Collection Time    03/27/12 11:46 AM   Component  Value  Range  Comment    Glucose-Capillary  110 (*)  70 - 99 mg/dL     Comment 1  Notify RN     No results found.  Post Admission Physician Evaluation:  1. Functional deficits secondary to thrombotic left thalamic infarct in the setting of seizure and post-ictal encephalopathy. 2. Patient is admitted to receive collaborative, interdisciplinary care between the physiatrist, rehab nursing staff, and therapy team. 3. Patient's level of medical complexity and substantial therapy needs in context of that medical necessity cannot be provided at a lesser intensity of care such as a SNF. 4. Patient has experienced substantial functional loss from his/her baseline which was documented above under the "Functional History" and "Functional Status" headings. Judging by the patient's diagnosis, physical exam, and functional history, the patient has potential for functional progress which will result in measurable gains while on inpatient rehab. These gains will be of substantial and practical use upon discharge in facilitating mobility and self-care at the household level. 5. Physiatrist will provide 24 hour management of medical needs as well as oversight of the therapy plan/treatment and provide guidance as appropriate regarding the interaction of the  two. 6. 24 hour rehab nursing will assist with bladder management, bowel management, safety, skin/wound care, disease management, medication administration, pain management and patient education and help integrate therapy concepts, techniques,education, etc. 7. PT will assess and treat for: Lower extremity strength, range of motion, stamina, balance, functional mobility, safety, adaptive techniques and equipment, NMR, education. Goals are: supervision to mod I. 8. OT will assess and treat for: ADL's, functional mobility, safety, upper extremity strength, adaptive techniques and equipment, NMR, education. Goals are: supervision to mod I. 9. SLP will assess and treat for: cognition, communication/language. Goals are: supervision. 10. Case Management  and Child psychotherapist will assess and treat for psychological issues and discharge planning. 11. Team conference will be held weekly to assess progress toward goals and to determine barriers to discharge. 12. Patient will receive at least 3 hours of therapy per day at least 5 days per week. 13. ELOS: 7-10 days Prognosis: good Medical Problem List and Plan:  1. DVT Prophylaxis/Anticoagulation: Pharmaceutical: Coumadin  2. Pain Management: N/A  3. Mood: Looks lot better cognitively. No signs of distress noted. Will have LCSW follow for formal evaluation.  4. Neuropsych: This patient is capable of making decisions on his/her own behalf.  5. E. Coli UTI: Treated with IV rocephin X 5 days. Keflex D#4/5   -would like to dc condom cath 6. Chronic A Fib: RVR due to fever resolved. Continue Cardizem and atenolol for rate control. Will continue to monitor HR with bid checks.  7. Grade 2 diastolic dysfunction: Will check daily weights. Continue lasix.  8. Seizure disorder: on Keppra 1000mg  bid.  9. Borderline diabetes: Monitor BS with AC/HS cbg checks. Reasonably controlled on diet.  10. OSA: resume CPAP. Patient educated on importance of compliance/use.   Ranelle Oyster, MD, [place] [facility]  03/28/12

## 2012-03-28 NOTE — Evaluation (Signed)
Speech Language Pathology Assessment and Plan  Patient Details  Name: Terry Robertson MRN: [id number] Date of Birth: 1929/01/06  SLP Diagnosis: Aphasia  Rehab Potential: Good ELOS: 1 week   Today's Date: 03/28/2012 Time: 4098-1191 Time Calculation (min): 55 min  Skilled Therapeutic Intervention: Administered a cognitive-linguistic evaluation and BSE. Please see below for details.   Problem List:  Patient Active Problem List  Diagnosis  . Hypotension - resolved; due to poor PO intake with continued Antihypertensive therapy  . CAD (coronary artery disease) PCI to circumflex in 1997.  100% occluded RCA  . Morbid obesity  . Myelodysplastic syndrome  . Dyslipidemia  . HTN (hypertension), controlled  . S/P TURP Oct 2012  . DM type 2 (diabetes mellitus, type 2)  . Diastolic dysfunction: Grade 2  . Thrombocytopenia, chronic  . Acute stroke - possibly  cardioembolic (08/09/2011)  . Hx of AF, NSR presently  . Acute respiratory failure, resolved  . Altered mental status, suspect post ictal  . Seizure, resolved  . Fever, unclear etiology  . Pulmonary edema  . CVA (cerebral infarction)   Past Medical History:  Past Medical History  Diagnosis Date  . Coronary artery disease   . Diabetes mellitus   . Hypertension   . Stroke   . Seizures   . OSA on CPAP    Past Surgical History: No past surgical history on file.  Assessment / Plan / Recommendation Clinical Impression  Pt is an 79 y.o. 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 He was admitted on 03/16/12 with markedly reduced speech output as well as eyes deviated to the right side. He was also noted not to be moving his right side, as well. CT scan of his head showed old left parietal stroke. There were no acute findings. While in radiology the patient had a witnessed focal motor seizure involving his right arm right leg  and right side of his face. Eyes were deviated tonically to the right side as well. He was given with 2 mg of Ativan. He's been taking Keppra 500 mg twice a day. A loading dose of Keppra 1 g was ordered to be given IV. Patient's seizure activity stopped following administration of initial dose of Ativan. He remained confused. MRI of brain with difficulty excluding acute infarct in dorsal left thalamus. Neurology consulted and recommended increasing Keppra to 1000 mg bid. Neurology feels that stroke not associated with current symptoms and likely thrombotic. To continue coumadin and no further work up needed. He was noted to be febrile on 01/15 and was started on IV rocephin for E coli UTI. Therapies ongoing and patient noted to be deconditioned with poor safety awareness. Encephalopathy resolving. Therapy team recommended CIR level therapies and patient admitted today for follow up therapy. Pt admitted to CIR 03/27/12 and presents with mild global aphasia impacting his functional communication, however, suspect most of his difficulty with verbal expression is from his previous CVA.  Pt would benefit from skilled SLP intervention to maximize functional communication.     SLP Assessment  Patient will need skilled Speech Lanaguage Pathology Services during CIR admission    Recommendations  Diet Recommendations: Regular;Thin liquid Liquid Administration via: Straw Medication Administration: Whole meds with liquid Supervision: Patient able to self feed Compensations: Small sips/bites;Slow rate Postural Changes and/or Swallow Maneuvers: Seated upright 90 degrees Oral Care Recommendations: Oral care BID Patient destination: Home Follow up Recommendations: 24 hour supervision/assistance  Equipment Recommended: None recommended by SLP    SLP Frequency 5 out of 7 days   SLP Treatment/Interventions Cueing hierarchy;Functional tasks;Environmental controls;Multimodal communication approach;Patient/family  education;Therapeutic Activities;Speech/Language facilitation    Pain Pain Assessment Pain Assessment: No/denies pain  Short Term Goals: Week 1: SLP Short Term Goal 1 (Week 1): LTG's=STG's  See FIM for current functional status Refer to Care Plan for Long Term Goals  Recommendations for other services: None  Discharge Criteria: Patient will be discharged from SLP if patient refuses treatment 3 consecutive times without medical reason, if treatment goals not met, if there is a change in medical status, if patient makes no progress towards goals or if patient is discharged from hospital.  The above assessment, treatment plan, treatment alternatives and goals were discussed and mutually agreed upon: by patient and by family  Devin Ganaway 03/28/2012, 3:39 PM

## 2012-03-28 NOTE — Progress Notes (Signed)
Patient information reviewed and entered into eRehab system by Ezrah Panning, RN, CRRN, PPS Coordinator.  Information including medical coding and functional independence measure will be reviewed and updated through discharge.     Per nursing patient was given "Data Collection Information Summary for Patients in Inpatient Rehabilitation Facilities with attached "Privacy Act Statement-Health Care Records" upon admission.  

## 2012-03-28 NOTE — Evaluation (Signed)
Occupational Therapy Assessment and Plan  Patient Details  Name: Laquentin Loudermilk MRN: [id number] Date of Birth: 1928-12-10  OT Diagnosis: muscle weakness (generalized) Rehab Potential: Rehab Potential: Good ELOS: 3-4 days   Today's Date: 03/28/2012 Time: 1478-2956 Time Calculation (min): 27 min  Problem List:  Patient Active Problem List  Diagnosis  . Hypotension - resolved; due to poor PO intake with continued Antihypertensive therapy  . CAD (coronary artery disease) PCI to circumflex in 1997.  100% occluded RCA  . Morbid obesity  . Myelodysplastic syndrome  . Dyslipidemia  . HTN (hypertension), controlled  . S/P TURP Oct 2012  . DM type 2 (diabetes mellitus, type 2)  . Diastolic dysfunction: Grade 2  . Thrombocytopenia, chronic  . Acute stroke - possibly  cardioembolic (08/09/2011)  . Hx of AF, NSR presently  . Acute respiratory failure, resolved  . Altered mental status, suspect post ictal  . Seizure, resolved  . Fever, unclear etiology  . Pulmonary edema  . CVA (cerebral infarction)    Past Medical History:  Past Medical History  Diagnosis Date  . Coronary artery disease   . Diabetes mellitus   . Hypertension   . Stroke   . Seizures   . OSA on CPAP    Past Surgical History: No past surgical history on file.  Assessment & Plan Clinical Impression: Patient is a 79 y.o. 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 He was admitted on 03/16/12 with markedly reduced speech output as well as eyes deviated to the right side. He was also noted not to be moving his right side, as well. CT scan of his head showed old left parietal stroke. There were no acute findings. While in radiology the patient had a witnessed focal motor seizure involving his right arm right leg and right side of his face. Eyes were deviated tonically to the right side as well. He was  given with 2 mg of Ativan. He's been taking Keppra 500 mg twice a day. A loading dose of Keppra 1 g was ordered to be given IV. Patient's seizure activity stopped following administration of initial dose of Ativan. He remained confused. MRI of brain with difficulty excluding acute infarct in dorsal left thalamus. Neurology consulted and recommended increasing Keppra to 1000 mg bid. Neurology feels that stroke not associated with current symptoms and likely thrombotic. To continue coumadin and no further work up needed. PT evaluation done and patient was noted to have difficulty following commands as well as problems with motor planning. He was noted to be febrile on 01/15 and was started on IV rocephin for E coli UTI. Therapies ongoing and patient noted to be deconditioned with poor safety. Encephalopathy resolving.   Patient transferred to CIR on 03/27/2012 .    Patient currently requires supervision to min A with basic self-care skills and basic mobility secondary to muscle weakness, decreased cardiorespiratoy endurance and decreased standing balance.  Prior to hospitalization, patient could complete ADL with occasional min A and mod I with basic mobility with RW. Pt does continue to present with residual weakness, decreased motor planning and decreased coordination from old CVA  Patient will benefit from skilled intervention to increase independence with basic self-care skills prior to discharge home with care partner.  Anticipate patient will require intermittent supervision and follow up home health.  OT - End of Session Activity Tolerance: Tolerates 30+ min activity with multiple rests  Endurance Deficit: Yes OT Assessment Rehab Potential: Good OT Plan OT Intensity: Minimum of 1-2 x/day, 45 to 90 minutes OT Duration/Estimated Length of Stay: 3-4 days OT Treatment/Interventions: Balance/vestibular training;Community reintegration;Discharge planning;DME/adaptive equipment instruction;Disease  mangement/prevention;Neuromuscular re-education;Therapeutic Exercise;UE/LE Coordination activities;UE/LE Strength taining/ROM;Self Care/advanced ADL retraining;Pain management;Patient/family education;Therapeutic Activities OT Recommendation Patient destination: Home Follow Up Recommendations: Home health OT  OT Evaluation Precautions/Restrictions  Precautions Precautions: Fall Precaution Comments: hx of seizure, forward knee flexion General Chart Reviewed: Yes Family/Caregiver Present: No Vital Signs Therapy Vitals Temp: 97.7 F (36.5 C) Temp src: Oral Pulse Rate: 71  Resp: 18  BP: 126/72 mmHg Patient Position, if appropriate: Sitting Oxygen Therapy SpO2: 95 % O2 Device: None (Room air) Pain Pain Assessment Pain Assessment: No/denies pain Pain Score: 0-No pain Home Living/Prior Functioning Home Living Lives With: Son Available Help at Discharge: Available 24 hours/day Type of Home: House Home Access: Stairs to enter Secretary/administrator of Steps: 1 Home Layout: One level Bathroom Shower/Tub: Health visitor: Standard Bathroom Accessibility: Yes Home Adaptive Equipment: Walker - rolling;Shower chair with back ADL   Vision/Perception  Vision - History Baseline Vision: Wears glasses all the time Vision - Assessment Eye Alignment: Within Functional Limits Perception Perception: Within Functional Limits Praxis Praxis: Impaired Praxis Impairment Details: Motor planning  Cognition Overall Cognitive Status: Appears within functional limits for tasks assessed Arousal/Alertness: Awake/alert Orientation Level: Oriented X4 Memory: Appears intact Awareness: Appears intact Problem Solving: Appears intact Safety/Judgment: Appears intact Sensation Sensation Light Touch: Impaired Detail Light Touch Impaired Details: Impaired RUE Hot/Cold: Appears Intact Proprioception: Impaired by gross assessment Coordination Fine Motor Movements are Fluid and  Coordinated: No Finger Nose Finger Test: off pointing to chest instead of nose Motor  Motor Motor - Skilled Clinical Observations: generalized weakness Mobility     Trunk/Postural Assessment  Cervical Assessment Cervical Assessment:  (forward flexion) Thoracic Assessment Thoracic Assessment:  (forward flexion)  Balance Balance Balance Assessed: Yes Standardized Balance Assessment Standardized Balance Assessment: Berg Balance Test Berg Balance Test Sit to Stand: Able to stand  independently using hands Standing Unsupported: Able to stand 30 seconds unsupported Sitting with Back Unsupported but Feet Supported on Floor or Stool: Able to sit safely and securely 2 minutes Stand to Sit: Sits safely with minimal use of hands Transfers: Able to transfer safely, definite need of hands From Standing Position, Pick up Object from Floor: Unable to pick up shoe, but reaches 2-5 cm (1-2") from shoe and balances independently Turn 360 Degrees: Able to turn 360 degrees safely but slowly Static Standing Balance Static Standing - Balance Support: During functional activity Static Standing - Level of Assistance: 5: Stand by assistance Dynamic Standing Balance Dynamic Standing - Balance Support: During functional activity Dynamic Standing - Level of Assistance: 4: Min assist Extremity/Trunk Assessment RUE Assessment RUE Assessment:  (demonstrated motor apraxia 4/5 strength overall) LUE Assessment LUE Assessment: Within Functional Limits  See FIM for current functional status Refer to Care Plan for Long Term Goals  Recommendations for other services: None  Discharge Criteria: Patient will be discharged from OT if patient refuses treatment 3 consecutive times without medical reason, if treatment goals not met, if there is a change in medical status, if patient makes no progress towards goals or if patient is discharged from hospital.  The above assessment, treatment plan, treatment alternatives  and goals were discussed and mutually agreed upon: by patient  OT evaluation with OT role, purpose and goals discussed with pt. Self care training at shower level. Focus on functional ambulation  with RW, sit to stands, standing balance, activity tolerance, practiced shower stall transfer (stepping over ledge). Noted with apraxia - pt reported it was his baseline.   [name] [facility] 03/28/2012, 4:08 PM

## 2012-03-28 NOTE — Progress Notes (Signed)
Occupational Therapy Session Note  Patient Details  Name: Terry Robertson MRN: [id number] Date of Birth: [birth year]  Today's Date: 03/28/2012 Time: 7829-5621 Time Calculation (min): 27 min  Skilled Therapeutic Interventions/Progress Updates:   Pt performed simple mobility to the BI gym with the RW and close supervision.  Pt exhibits moderate lumbar kyphosis and cervical flexion in standing.  Worked on RUE coordination with ball toss.  Began with large ball which pt was able to catch and toss without difficulty.  Progressed to tossing and catching a tennis ball.  Pt demonstrated moderated difficulty catching the tennis ball with both hands, but was able to toss it back with the RUE.  Also performed FM task nine hole peg test.  Pt able to perform in 30 seconds on the LUE and 50 seconds on the right (just putting the pegs in).   Therapy Documentation Precautions:  Precautions Precautions: Fall Precaution Comments: hx of seizure, forward knee flexion Restrictions Weight Bearing Restrictions: No  Vital Signs: Therapy Vitals Temp: 97.7 F (36.5 C) Temp src: Oral Pulse Rate: 71  Resp: 18  BP: 126/72 mmHg Patient Position, if appropriate: Sitting Oxygen Therapy SpO2: 95 % O2 Device: None (Room air) Pain: Pain Assessment Pain Assessment: No/denies pain   See FIM for current functional status  Therapy/Group: Individual Therapy  Mazey Mantell OTR/L 03/28/2012, 4:10 PM

## 2012-03-28 NOTE — Progress Notes (Signed)
ANTICOAGULATION CONSULT NOTE - Follow Up Consult  Pharmacy Consult for Coumadin Indication: atrial fibrillation  No Known Allergies  Patient Measurements: Weight: 245 lb 9.5 oz (111.4 kg)  Vital Signs: Temp: 97.8 F (36.6 C) (01/23 0503) Temp src: Oral (01/23 0503) BP: 136/68 mmHg (01/23 0743) Pulse Rate: 75  (01/23 0743)  Labs:  Alvira Philips 03/28/12 0615 03/27/12 0602 03/26/12 0512  HGB 10.9* -- --  HCT 34.3* -- --  PLT 186 -- --  APTT -- -- --  LABPROT 17.9* 17.8* 19.9*  INR 1.52* 1.51* 1.76*  HEPARINUNFRC -- -- --  CREATININE 0.84 -- --  CKTOTAL -- -- --  CKMB -- -- --  TROPONINI -- -- --    The CrCl is unknown because both a height and weight (above a minimum accepted value) are required for this calculation.  Assessment: 83yom on Coumadin for hx Afib. INR (1.52) remains subtherapeutic. CBC stable, no bleeding noted per chart.  Coumadin home dose: 10mg  on Monday, Wednesday, and Friday; 7.5mg  on Sunday, Tuesday, Thursday, and Saturday  Goal of Therapy:  INR 2-3   Plan:  1. Coumadin 10mg  (home dose) PO x 1 tonight 2. F/u AM INR (daily while on therapy) 3. Consider bridging with a CHADS2 of 5  Bayard Hugger, PharmD, BCPS  Clinical Pharmacist  Pager: [phone]   03/28/2012, 1:57 PM

## 2012-03-28 NOTE — Evaluation (Signed)
Physical Therapy Assessment and Plan  Patient Details  Name: Andreas Sobolewski MRN: [id number] Date of Birth: 24-Sep-1928  PT Diagnosis: Difficulty walking and Muscle weakness Rehab Potential: Good ELOS: 2-3 days   Today's Date: 03/28/2012 Time: 755-855 60 minutes Problem List:  Patient Active Problem List  Diagnosis  . Hypotension - resolved; due to poor PO intake with continued Antihypertensive therapy  . CAD (coronary artery disease) PCI to circumflex in 1997.  100% occluded RCA  . Morbid obesity  . Myelodysplastic syndrome  . Dyslipidemia  . HTN (hypertension), controlled  . S/P TURP Oct 2012  . DM type 2 (diabetes mellitus, type 2)  . Diastolic dysfunction: Grade 2  . Thrombocytopenia, chronic  . Acute stroke - possibly  cardioembolic (08/09/2011)  . Hx of AF, NSR presently  . Acute respiratory failure, resolved  . Altered mental status, suspect post ictal  . Seizure, resolved  . Fever, unclear etiology  . Pulmonary edema  . CVA (cerebral infarction)    Past Medical History:  Past Medical History  Diagnosis Date  . Coronary artery disease   . Diabetes mellitus   . Hypertension   . Stroke   . Seizures   . OSA on CPAP    Past Surgical History: No past surgical history on file.  Assessment & Plan Clinical Impression: Patient is a 79 y.o. year old male with recent admission to the hospital on 03/16/12 with markedly reduced speech output as well as eyes deviated to the right side. He was also noted not to be moving his right side, as well. CT scan of his head showed old left parietal stroke. There were no acute findings. While in radiology the patient had a witnessed focal motor seizure involving his right arm right leg and right side of his face. Eyes were deviated tonically to the right side as well. He was given with 2 mg of Ativan. He's been taking Keppra 500 mg twice a day. A loading dose of Keppra 1 g was ordered to be given IV. Patient's seizure activity stopped following  administration of initial dose of Ativan. He remained confused. MRI of brain with difficulty excluding acute infarct in dorsal left thalamus. Neurology consulted and recommended increasing Keppra to 1000 mg bid. Neurology feels that stroke not associated with current symptoms and likely thrombotic. To continue coumadin and no further work up needed.  Patient transferred to CIR on 03/27/2012 .   Patient currently requires min with mobility secondary to muscle weakness and decreased standing balance and decreased balance strategies.  Prior to hospitalization, patient was modified independent  with mobility and lived with Son in a House home.  Home access is 1Stairs to enter.  Patient will benefit from skilled PT intervention to maximize safe functional mobility, minimize fall risk and decrease caregiver burden for planned discharge home with 24 hour supervision.  Anticipate patient will benefit from follow up HH at discharge.  PT - End of Session Activity Tolerance: Tolerates 30+ min activity with multiple rests Endurance Deficit: Yes PT Assessment Rehab Potential: Good Barriers to Discharge: None PT Plan PT Intensity: Minimum of 1-2 x/day ,45 to 90 minutes PT Frequency: 5 out of 7 days PT Duration Estimated Length of Stay: 2-3 days PT Treatment/Interventions: Ambulation/gait training;Balance/vestibular training;Discharge planning;Patient/family education;Stair training;Therapeutic Activities;Functional mobility training;UE/LE Coordination activities;Wheelchair propulsion/positioning;UE/LE Strength taining/ROM;Pain management;Neuromuscular re-education;Therapeutic Exercise;DME/adaptive equipment instruction PT Recommendation Follow Up Recommendations: Home health PT Patient destination: Home Equipment Recommended: Rolling walker with 5" wheels  PT Evaluation Precautions/Restrictions Precautions Precautions: Fall Restrictions  Weight Bearing Restrictions: No Pain Pain Assessment Pain  Assessment: No/denies pain Home Living/Prior Functioning Home Living Lives With: Son Available Help at Discharge: Family;Available 24 hours/day Type of Home: House Home Access: Stairs to enter [employer] of Steps: 1 Entrance Stairs-Rails: Can reach both Home Layout: One level Home Adaptive Equipment: Walker - rolling Prior Function Level of Independence: Needs assistance with ADLs;Requires assistive device for independence Able to Take Stairs?: Yes Driving: Yes   Cognition Overall Cognitive Status: Appears within functional limits for tasks assessed Arousal/Alertness: Awake/alert Orientation Level: Oriented X4 Memory: Appears intact Awareness: Appears intact Problem Solving: Appears intact Safety/Judgment: Appears intact Sensation Sensation Light Touch: Impaired Detail Light Touch Impaired Details: Impaired RUE Proprioception: Impaired by gross assessment Coordination Gross Motor Movements are Fluid and Coordinated: Yes Motor  Motor Motor - Skilled Clinical Observations: generalized weakness  Mobility Transfers Stand Pivot Transfers: 4: Min assist Stand Pivot Transfer Details (indicate cue type and reason): steadying assist Locomotion  Ambulation Ambulation/Gait Assistance: 4: Min assist Ambulation Distance (Feet): 150 Feet Assistive device: Rolling walker Ambulation/Gait Assistance Details: cues for safety, fwd flexed posture  Trunk/Postural Assessment  Cervical Assessment Cervical Assessment:  (fwd flexed) Thoracic Assessment Thoracic Assessment:  (fwd flexed) Lumbar Assessment Lumbar Assessment:  (post tilt) Postural Control Postural Control: Within Functional Limits  Balance Standardized Balance Assessment Standardized Balance Assessment: Berg Balance Test Berg Balance Test Sit to Stand: Able to stand  independently using hands Standing Unsupported: Able to stand 30 seconds unsupported Sitting with Back Unsupported but Feet Supported on Floor  or Stool: Able to sit safely and securely 2 minutes Stand to Sit: Sits safely with minimal use of hands Transfers: Able to transfer safely, definite need of hands From Standing Position, Pick up Object from Floor: Unable to pick up shoe, but reaches 2-5 cm (1-2") from shoe and balances independently Turn 360 Degrees: Able to turn 360 degrees safely but slowly Static Standing Balance Static Standing - Level of Assistance: 4: Min assist Extremity Assessment      RLE Assessment RLE Assessment: Within Functional Limits LLE Assessment LLE Assessment: Within Functional Limits  FIM:  FIM - Locomotion: Ambulation Ambulation/Gait Assistance: 4: Min assist   Refer to Care Plan for Long Term Goals  Recommendations for other services: None  Discharge Criteria: Patient will be discharged from PT if patient refuses treatment 3 consecutive times without medical reason, if treatment goals not met, if there is a change in medical status, if patient makes no progress towards goals or if patient is discharged from hospital.  The above assessment, treatment plan, treatment alternatives and goals were discussed and mutually agreed upon: by patient  Treatment initiated during session: Gait training with obstacle negotiation with close supervision-min A on carpet and tile.  Berg balance components performed (see above).  Pt unable to complete full Berg due to back pain. Step ups and tap ups performed for LE strength and endurance.  Pt reports he feels his balance is close to baseline, he just gets "more tired"  Lorenna Lurry 03/28/2012, 3:56 PM

## 2012-03-29 ENCOUNTER — Inpatient Hospital Stay (HOSPITAL_COMMUNITY): Payer: Medicare Other | Admitting: Physical Therapy

## 2012-03-29 ENCOUNTER — Encounter (HOSPITAL_COMMUNITY): Payer: Medicare Other | Admitting: Occupational Therapy

## 2012-03-29 ENCOUNTER — Inpatient Hospital Stay (HOSPITAL_COMMUNITY): Payer: Medicare Other | Admitting: Speech Pathology

## 2012-03-29 DIAGNOSIS — R569 Unspecified convulsions: Secondary | ICD-10-CM

## 2012-03-29 DIAGNOSIS — I10 Essential (primary) hypertension: Secondary | ICD-10-CM

## 2012-03-29 DIAGNOSIS — I6330 Cerebral infarction due to thrombosis of unspecified cerebral artery: Secondary | ICD-10-CM

## 2012-03-29 LAB — GLUCOSE, CAPILLARY: Glucose-Capillary: 94 mg/dL (ref 70–99)

## 2012-03-29 MED ORDER — WARFARIN SODIUM 10 MG PO TABS
10.0000 mg | ORAL_TABLET | Freq: Once | ORAL | Status: AC
  Administered 2012-03-29: 10 mg via ORAL
  Filled 2012-03-29: qty 1

## 2012-03-29 MED ORDER — LEVETIRACETAM 1000 MG PO TABS: 1000.0000 mg | ORAL_TABLET | Freq: Two times a day (BID) | ORAL | Status: DC

## 2012-03-29 MED ORDER — WARFARIN SODIUM 10 MG PO TABS: 10.0000 mg | ORAL_TABLET | Freq: Every day | ORAL | Status: DC

## 2012-03-29 MED ORDER — FUROSEMIDE 20 MG PO TABS: 20.0000 mg | ORAL_TABLET | Freq: Every day | ORAL | Status: DC

## 2012-03-29 NOTE — Progress Notes (Signed)
Speech Language Pathology Session Note & Discharge Summary  Patient Details  Name: Terry Robertson MRN: [id number] Date of Birth: 11-14-1928  Today's Date: 03/29/2012 Time: 0930-1000 Time Calculation (min): 30 min  Skilled Therapeutic Intervention: Treatment focus on cognitive-linguistic goals. Pt participated in mildly complex verbal expression task of comparing/contrasting two objects. Pt was able to name the objects with 100% accuracy but required Min A sentence completion cues to for phrase level communication for describing similarities/diffferences between the objects.   Patient has met 2 of 2 long term goals.  Patient to discharge at overall Modified Independent;Min level.   Reasons goals not met: N/A   Clinical Impression/Discharge Summary: Pt has met all LTG's this admission. Currently, pt is overall Mod I for basic, functional communication but requires Min A multimodal cueing for more mildly complex auditory comprehension and verbal expression.  Pt education complete. Suspect pt is at cognitive-linguistic baseline; therefore f/u skilled SLP intervention is not warranted at this time. Pt will discharge home with family.   Care Partner:  Caregiver Able to Provide Assistance: Yes  Type of Caregiver Assistance: Cognitive  Recommendation:  24 hour supervision/assistance;None      Equipment: N/A   Reasons for discharge: Treatment goals met;Discharged from hospital   Patient/Family Agrees with Progress Made and Goals Achieved: Yes   See FIM for current functional status  Nyala Kirchner 03/29/2012, 11:07 AM

## 2012-03-29 NOTE — Progress Notes (Signed)
[facility] Individual Statement of Services  Patient Name:  Terry Robertson  Date:  03/29/2012  Welcome to the [facility].  Our goal is to provide you with an individualized program based on your diagnosis and situation, designed to meet your specific needs.  With this comprehensive rehabilitation program, you will be expected to participate in at least 3 hours of rehabilitation therapies Monday-Friday, with modified therapy programming on the weekends.  Your rehabilitation program will include the following services:  Physical Therapy (PT), Occupational Therapy (OT), Speech Therapy (ST), 24 hour per day rehabilitation nursing, Case Management (Social Worker), Rehabilitation Medicine, Nutrition Services and Pharmacy Services  Weekly team conferences will be held on Tuesdays to discuss your progress.  Your  Social Worker will talk with you frequently to get your input and to update you on team discussions.  Team conferences with you and your family in attendance may also be held.  Expected length of stay: 3 days  Overall anticipated outcome: modified independent  Depending on your progress and recovery, your program may change.  Your  Social Worker will coordinate services and will keep you informed of any changes.  Your  Social Worker's name and contact numbers are listed  below.  The following services may also be recommended but are not provided by the [facility]:   Driving Evaluations  Home Health Rehabiltiation Services  Outpatient Rehabilitatation [facility]  Vocational Rehabilitation   Arrangements will be made to provide these services after discharge if needed.  Arrangements include referral to agencies that provide these services.  Your insurance has been verified to be:  [employer] and [employer] Your primary doctor is:  Dr. Dorothyann Peng  Pertinent information will be shared with your doctor and your  insurance company.   Social Worker:  [place], [place] [phone] or ([phone]  Information discussed with and copy given to patient by: Amada Jupiter, 03/29/2012, 2:17 PM

## 2012-03-29 NOTE — Progress Notes (Signed)
Physical Therapy Discharge Summary  Patient Details  Name: Terry Robertson MRN: [id number] Date of Birth: May 01, 1928  Today's Date: 03/29/2012  Patient has met 6 of 6 long term goals due to improved activity tolerance, improved balance and ability to compensate for deficits.  Patient to discharge at an ambulatory level Modified Independent with RW.  Pt appears to be at baseline level of function and cognition.  Reasons goals not met: n/a  Equipment: RW  Reasons for discharge: treatment goals met and discharge from hospital  Patient/family agrees with progress made and goals achieved: Yes  PT Discharge  Cognition Overall Cognitive Status: Impaired at baseline Arousal/Alertness: Awake/alert Orientation Level: Oriented X4 Sensation Sensation Light Touch Impaired Details: Impaired RUE (not new from old CVA) Hot/Cold: Appears Intact Coordination Gross Motor Movements are Fluid and Coordinated: Yes Fine Motor Movements are Fluid and Coordinated: No Motor  Motor Motor: Within Functional Limits   Trunk/Postural Assessment  Cervical Assessment Cervical Assessment:  (fwd flexed) Thoracic Assessment Thoracic Assessment:  (forward flexed ) Lumbar Assessment Lumbar Assessment:  (posterior tilt) Postural Control Postural Control: Within Functional Limits  Balance Balance Balance Assessed: Yes Static Standing Balance Static Standing - Balance Support: During functional activity Static Standing - Level of Assistance: 6: Modified independent (Device/Increase time) Dynamic Standing Balance Dynamic Standing - Balance Support: Bilateral upper extremity supported;During functional activity Dynamic Standing - Level of Assistance: 6: Modified independent (Device/Increase time)  See FIM for current functional status  Harneet Noblett 03/29/2012, 1:43 PM

## 2012-03-29 NOTE — Progress Notes (Signed)
Physical Therapy Note  Patient Details  Name: Bradrick Kamau MRN: [id number] Date of Birth: Sep 02, 1928 Today's Date: 03/29/2012  Time: 1300-1340 40 minutes  No c/o pain.  Bed mobility training in ADL apartment with pt able to perform supine <> sit with mod I.  Standing therex marches, wall squats and mini squats for LE strength and endurance.  Nustep for LE/UE strength and endurance x 5 minutes level 4.  Pt requires frequent rest breaks between all exercises, more fatigued this afternoon.  Long distance walking with focus in increasing strength and activity tolerance mod I with RW.  Pt reports he feels ready for d/c home tomorrow.  Individual therapy   Taijuan Serviss 03/29/2012, 1:41 PM

## 2012-03-29 NOTE — Progress Notes (Signed)
Subjective/Complaints: Had a good day with therapies yesterday. A 12 point review of systems has been performed and if not noted above is otherwise negative.   Objective: Vital Signs: Blood pressure 138/65, pulse 77, temperature 98 F (36.7 C), temperature source Oral, resp. rate 18, weight 111.4 kg (245 lb 9.5 oz), SpO2 96.00%. No results found.  Basename 03/28/12 0615  WBC 4.3  HGB 10.9*  HCT 34.3*  PLT 186    Basename 03/28/12 0615  NA 140  K 4.6  CL 102  GLUCOSE 107*  BUN 13  CREATININE 0.84  CALCIUM 9.5   CBG (last 3)   Basename 03/28/12 2146 03/28/12 1636 03/28/12 1130  GLUCAP 94 117* 86    Wt Readings from Last 3 Encounters:  03/28/12 111.4 kg (245 lb 9.5 oz)  03/25/12 112.084 kg (247 lb 1.6 oz)  01/02/12 108.818 kg (239 lb 14.4 oz)    Physical Exam:  Constitutional: He is oriented to person, place, and time. He appears well-developed and well-nourished.  HENT:  Head: Normocephalic and atraumatic.  Cardiovascular: Normal rate and regular rhythm.  Pulmonary/Chest: Effort normal and breath sounds normal.  Abdominal: Soft. He exhibits no distension.  Musculoskeletal: He exhibits no edema.  Neurological: He is alert and oriented to person, place, and time.  Follows basic commands without difficulty. Minimal expressive deficits. Decreased fine motor control RUE.    Assessment/Plan: 1. Functional deficits secondary to thrombotic left thalamic infarct with recent seizure which require 3+ hours per day of interdisciplinary therapy in a comprehensive inpatient rehab setting. Physiatrist is providing close team supervision and 24 hour management of active medical problems listed below. Physiatrist and rehab team continue to assess barriers to discharge/monitor patient progress toward functional and medical goals.  Did quite well with therapies yesterday. Will target dc tomorrow  FIM:             FIM - Bed/Chair Transfer Bed/Chair Transfer Assistive  Devices: Bed rails;Arm rests Bed/Chair Transfer: 4: Bed > Chair or W/C: Min A (steadying Pt. > 75%);4: Chair or W/C > Bed: Min A (steadying Pt. > 75%)  FIM - Locomotion: Wheelchair Locomotion: Wheelchair: 0: Activity did not occur FIM - Locomotion: Ambulation Ambulation/Gait Assistance: 4: Min assist Locomotion: Ambulation: 4: Travels 150 ft or more with minimal assistance (Pt.>75%)  Comprehension Comprehension Mode: Auditory Comprehension: 5-Follows basic conversation/direction: With extra time/assistive device  Expression Expression Mode: Verbal Expression: 5-Expresses basic needs/ideas: With extra time/assistive device  Social Interaction Social Interaction: 5-Interacts appropriately 90% of the time - Needs monitoring or encouragement for participation or interaction.  Problem Solving Problem Solving: 5-Solves basic problems: With no assist  Memory Memory: 6-More than reasonable amt of time  Medical Problem List and Plan:  1. DVT Prophylaxis/Anticoagulation: Pharmaceutical: Coumadin  2. Pain Management: N/A  3. Mood: Looks lot better cognitively. No signs of distress noted. Will have LCSW follow for formal evaluation.  4. Neuropsych: This patient is capable of making decisions on his/her own behalf.  5. E. Coli UTI: Treated with IV rocephin X 5 days. Keflex complete today -would like to dc condom cath  6. Chronic A Fib: RVR due to fever resolved. Continue Cardizem and atenolol for rate control. Will continue to monitor HR with bid checks.  7. Grade 2 diastolic dysfunction: Will check daily weights. Continue lasix.  8. Seizure disorder: on Keppra 1000mg  bid.  9. Borderline diabetes: Monitor BS with AC/HS cbg checks. Reasonably controlled on diet.  10. OSA: resume CPAP. Patient educated on importance of compliance/use.  LOS (Days) 2 A FACE TO FACE EVALUATION WAS PERFORMED  SWARTZ,ZACHARY T 03/29/2012 6:56 AM

## 2012-03-29 NOTE — Progress Notes (Signed)
Social Work  Discharge Note  The overall goal for the admission was met for:   Discharge location: Yes - home with sons and girlfriend to assist as needed  Length of Stay: Yes - 3 days (w/ 1/25 discharge)  Discharge activity level: Yes - modified independent  Home/community participation: Yes  Services provided included: MD, RD, PT, OT, SLP, RN, TR, Pharmacy and SW  Financial Services: Medicare and Private Insurance: [employer]  Follow-up services arranged: DME: rolling walker via [facility]  Comments (or additional information):  Patient/Family verbalized understanding of follow-up arrangements: Other : NA no follow up therapies recommended  Individual responsible for coordination of the follow-up plan: patient  Confirmed correct DME delivered: Nikhita Mentzel 03/29/2012    Alver Leete

## 2012-03-29 NOTE — Progress Notes (Signed)
Occupational Therapy Discharge Summary  Patient Details  Name: Terry Robertson MRN: [id number] Date of Birth: November 19, 1928  Today's Date: 03/29/2012  Patient has met 7 of 7 long term goals due to improved activity tolerance, improved balance, postural control, ability to compensate for deficits and activity tolerance.  Patient to discharge at overall supervision to mod I  level.  Patient's care partner is independent to provide the necessary physical assistance at discharge for IADLs and intermittent A with ADLs    Reasons goals not met: n/a  Recommendation:  No OT f/u at this time.   Equipment: No equipment provided  Reasons for discharge: treatment goals met and discharge from hospital  Patient/family agrees with progress made and goals achieved: Yes  OT Discharge Precautions/Restrictions  Precautions Precaution Comments: hx of seizure, forward knee flexion, old CVA Restrictions Weight Bearing Restrictions: No   Vital Signs Therapy Vitals Pulse Rate: 80  BP: 126/78 mmHg Pain Pain Assessment Pain Assessment: No/denies pain ADL  see FIM Vision/Perception  Vision - History Baseline Vision: Wears glasses all the time Patient Visual Report: No change from baseline Vision - Assessment Eye Alignment: Within Functional Limits Perception Perception: Within Functional Limits Praxis Praxis Impairment Details: Motor planning (from old CVA)  Cognition Overall Cognitive Status: Impaired at baseline Arousal/Alertness: Awake/alert Orientation Level: Oriented X4 Sensation Sensation Light Touch Impaired Details: Impaired RUE (not new from old CVA) Hot/Cold: Appears Intact Coordination Gross Motor Movements are Fluid and Coordinated: Yes Fine Motor Movements are Fluid and Coordinated: No Motor  Motor Motor: Within Functional Limits Mobility  Transfers Sit to Stand: 6: Modified independent (Device/Increase time) Stand to Sit: 6: Modified independent (Device/Increase time)    Trunk/Postural Assessment  Cervical Assessment Cervical Assessment:  (fwd flexed) Thoracic Assessment Thoracic Assessment:  (forward flexed ) Lumbar Assessment Lumbar Assessment:  (posterior tilt) Postural Control Postural Control: Within Functional Limits  Balance Balance Balance Assessed: Yes Static Standing Balance Static Standing - Balance Support: During functional activity Static Standing - Level of Assistance: 6: Modified independent (Device/Increase time) Dynamic Standing Balance Dynamic Standing - Balance Support: Bilateral upper extremity supported;During functional activity Dynamic Standing - Level of Assistance: 6: Modified independent (Device/Increase time) Extremity/Trunk Assessment RUE Assessment RUE Assessment:  (demonstrated motor apraxia 4/5 strength overall) LUE Assessment LUE Assessment: Within Functional Limits  See FIM for current functional status  [name] [facility] 03/29/2012, 1:21 PM

## 2012-03-29 NOTE — Progress Notes (Signed)
Physical Therapy Note  Patient Details  Name: Terry Robertson MRN: [id number] Date of Birth: 05-Jun-1928 Today's Date: 03/29/2012  Time: 800-855 55 minutes  No c/o pain.  Gait training with RW in controlled and household environments with mod I, no LOB noted.  Stair training x 12 stairs with B handrails with mod I.  Curb step training with RW with mod I, no LOB.  Simulated car transfer with mod I, pt with good safety awareness.  Standing therex for LE strengthening and endurance, step ups and tap ups to 6'' step with RW for support.  Seated UE strengthening with ball toss, lift and pass with focus on endurance and ROM of all UE muscles.  Pt fatigues quickly, requiring frequent rests but is safe and able to maintain balance throughout session.  Individual therapy   Trenee Igoe 03/29/2012, 8:55 AM

## 2012-03-29 NOTE — Progress Notes (Signed)
This note has been reviewed and this clinician agrees with information provided.  

## 2012-03-29 NOTE — Progress Notes (Signed)
Occupational Therapy Session Note  Patient Details  Name: Jerick Khachatryan MRN: [id number] Date of Birth: 1928/05/21  Today's Date: 03/29/2012 Time: 1030-1130 Time Calculation (min): 60 min  Short Term Goals: Week 1:  OT Short Term Goal 1 (Week 1): STG=LTG  Skilled Therapeutic Interventions/Progress Updates: Worked with pt. today to address self care task with a focus on functional ambulation, activity tolerance, sit to stands and dynamic standing balance. Pt. functionally ambulated to BR using RW, performing bathing and dressing task at shower level with supervision/set up level and min verbal cue for safety. Pt. Had difficulty with donning socks and states he uses a sock aid at home. Ambulated from patients room to the  kitchen to assess functional mobility within fucntional task including cooking and washing dishes. Completed task with supervision and min cuing requiring multiple breaks. Patient Mod I for toileting.    Therapy Documentation Precautions:  Precautions Precautions: Fall Precaution Comments: hx of seizure, forward knee flexion Restrictions Weight Bearing Restrictions: No Pain: Pain Assessment Pain Assessment: No/denies pain  See FIM for current functional status  Therapy/Group: Individual Therapy  Raven Furnas OTA/S Occupational Therapy Assistant Student 03/29/2012, 12:56 PM

## 2012-03-29 NOTE — Progress Notes (Signed)
Social Work  Social Work Assessment and Plan  Patient Details  Name: Terry Robertson MRN: [id number] Date of Birth: 11/02/1928  Today's Date: 03/29/2012  Problem List:  Patient Active Problem List  Diagnosis  . Hypotension - resolved; due to poor PO intake with continued Antihypertensive therapy  . CAD (coronary artery disease) PCI to circumflex in 1997.  100% occluded RCA  . Morbid obesity  . Myelodysplastic syndrome  . Dyslipidemia  . HTN (hypertension), controlled  . S/P TURP Oct 2012  . DM type 2 (diabetes mellitus, type 2)  . Diastolic dysfunction: Grade 2  . Thrombocytopenia, chronic  . Acute stroke - possibly  cardioembolic (08/09/2011)  . Hx of AF, NSR presently  . Acute respiratory failure, resolved  . Altered mental status, suspect post ictal  . Seizure, resolved  . Fever, unclear etiology  . Pulmonary edema  . CVA (cerebral infarction)   Past Medical History:  Past Medical History  Diagnosis Date  . Coronary artery disease   . Diabetes mellitus   . Hypertension   . Stroke   . Seizures   . OSA on CPAP    Past Surgical History: No past surgical history on file. Social History:  reports that he quit smoking about 24 years ago. His smoking use included Cigarettes. He does not have any smokeless tobacco history on file. He reports that he does not drink alcohol or use illicit drugs.  Family / Support Systems Marital Status: Widow/Widower Patient Roles: Parent;Other (Comment) (Has a girlfriend who lives 3-4 minutes away.) Spouse/Significant Other: girlfriend, Terry Robertson @ [phone] or [phone] Children: sons, Terry Robertson and Terry Robertson @ [phone] and Terry Robertson @ [phone] plus a daughter, Terry Robertson ([place] @ [phone] or [phone] Anticipated Caregiver: girlfriend and family Ability/Limitations of Caregiver: Girlfriend can assist.  Dtr in [place].  Has 5 sons, 1 dtr, 2 step dtrs and a step son Caregiver Availability: Other (Comment)  (GF can move in with him if needed.  Could go to [place] with dtr.) Family Dynamics: Pt notes most children are very supportive.  Good relationship with girlfriend.  Social History Preferred language: English Religion: Non-Denominational Cultural Background: NA Education: HS Read: Yes Write: Yes Employment Status: Retired Fish farm manager Issues: none Guardian/Conservator: Daughter in [place] is pt' POA   Abuse/Neglect Physical Abuse: Denies Verbal Abuse: Denies Sexual Abuse: Denies Exploitation of patient/patient's resources: Denies Self-Neglect: Denies  Emotional Status Pt's affect, behavior adn adjustment status: Pt very pleasant, oriented and motivated for tx.  Denies any s/s of depression or anxiety. Recent Psychosocial Issues: none Pyschiatric History: none Substance Abuse History: none  Patient / Family Perceptions, Expectations & Goals Pt/Family understanding of illness & functional limitations: Pt and girlfriend with basic understanding that pt suffered seizure activity which was cause of physical and cognitive decline.  Much better now and approaching baseline. Premorbid pt/family roles/activities: Pt was relatively independent.  Sons and girlfriend would assist as needed. Anticipated changes in roles/activities/participation: Little change anticipated as pt and girlfriend feel he is approaching baseling level of functioning Pt/family expectations/goals: 'Go home"  [employer]: None Premorbid Home Care/DME Agencies: None Transportation available at discharge: yes  Discharge Planning Living Arrangements: Children Support Systems: Children;Spouse/significant other Type of Residence: Private residence Insurance Resources: Medicare ([employer] Medicare) Financial Resources: Restaurant manager, [employer] Referred: No Living Expenses: Own Money Management: Patient Do you have any problems obtaining your medications?: No Home  Management: pt and sons Patient/Family Preliminary Plans: Pt to  return home with sons, girlfriend to assist as needed DC Planning Additional Notes/Comments: no therapy follow up being recommended Expected length of stay: 3 days  Clinical Impression Very pleasant gentleman here after decline in function due to seizure activity.  Approaching baseline function very quickly and teams feel will be ready to go home tomorrow.  No follow up needs identified except for DME.  Terry Robertson 03/29/2012, 2:15 PM

## 2012-03-29 NOTE — Progress Notes (Signed)
ANTICOAGULATION CONSULT NOTE - Follow Up Consult  Pharmacy Consult for Coumadin Indication: atrial fibrillation  No Known Allergies  Patient Measurements: Weight: 232 lb (105.235 kg) (stand up scale)  Vital Signs: Temp: 98 F (36.7 C) (01/24 0652) Temp src: Oral (01/24 0652) BP: 126/78 mmHg (01/24 0958) Pulse Rate: 80  (01/24 0958)  Labs:  Terry Robertson 03/29/12 4098 03/28/12 0615 03/27/12 0602  HGB -- 10.9* --  HCT -- 34.3* --  PLT -- 186 --  APTT -- -- --  LABPROT 18.3* 17.9* 17.8*  INR 1.57* 1.52* 1.51*  HEPARINUNFRC -- -- --  CREATININE -- 0.84 --  CKTOTAL -- -- --  CKMB -- -- --  TROPONINI -- -- --    The CrCl is unknown because both a height and weight (above a minimum accepted value) are required for this calculation.  Assessment: 83yom on Coumadin for hx Afib. INR (1.57) remains subtherapeutic. CBC stable, no bleeding noted per chart.  Coumadin home dose: 10mg  on Monday, Wednesday, and Friday; 7.5mg  on Sunday, Tuesday, Thursday, and Saturday  Goal of Therapy:  INR 2-3   Plan:  1. Coumadin 10mg  (home dose) PO x 1 tonight 2. F/u AM INR (daily while on therapy) 3. Consider bridging with a CHADS2 of 5  Bayard Hugger, PharmD, BCPS  Clinical Pharmacist  Pager: [phone]   03/29/2012, 1:50 PM

## 2012-03-30 LAB — PROTIME-INR: Prothrombin Time: 19.4 seconds — ABNORMAL HIGH (ref 11.6–15.2)

## 2012-03-30 NOTE — Progress Notes (Signed)
Subjective/Complaints: Ready to go home today A 12 point review of systems has been performed and if not noted above is otherwise negative.   Objective: Vital Signs: Blood pressure 139/68, pulse 80, temperature 98.2 F (36.8 C), temperature source Oral, resp. rate 18, weight 105.235 kg (232 lb), SpO2 99.00%. No results found.  Basename 03/28/12 0615  WBC 4.3  HGB 10.9*  HCT 34.3*  PLT 186    Basename 03/28/12 0615  NA 140  K 4.6  CL 102  GLUCOSE 107*  BUN 13  CREATININE 0.84  CALCIUM 9.5   CBG (last 3)   Basename 03/29/12 2102 03/29/12 1619 03/29/12 1126  GLUCAP 130* 94 94    Wt Readings from Last 3 Encounters:  03/29/12 105.235 kg (232 lb)  03/25/12 112.084 kg (247 lb 1.6 oz)  01/02/12 108.818 kg (239 lb 14.4 oz)    Physical Exam:  Constitutional: He is oriented to person, place, and time. He appears well-developed and well-nourished.  HENT:  Head: Normocephalic and atraumatic.  Cardiovascular: Normal rate and regular rhythm.  Pulmonary/Chest: Effort normal and breath sounds normal.  Abdominal: Soft. He exhibits no distension.  Musculoskeletal: He exhibits no edema.  Neurological: He is alert and oriented to person, place, and time.  Follows basic commands without difficulty. Minimal expressive deficits. Decreased fine motor control RUE.    Assessment/Plan: 1. Functional deficits secondary to thrombotic left thalamic infarct with recent seizure which require 3+ hours per day of interdisciplinary therapy in a comprehensive inpatient rehab setting. Physiatrist is providing close team supervision and 24 hour management of active medical problems listed below. Physiatrist and rehab team continue to assess barriers to discharge/monitor patient progress toward functional and medical goals.  Dc home today/ instructions reviewed and pt clear FIM: FIM - Bathing Bathing Steps Patient Completed: Chest;Right Arm;Left Arm;Abdomen;Front perineal area;Buttocks;Right upper  leg;Left upper leg;Right lower leg (including foot);Left lower leg (including foot) Bathing: 5: Supervision: Safety issues/verbal cues  FIM - Upper Body Dressing/Undressing Upper body dressing/undressing steps patient completed: Thread/unthread left sleeve of pullover shirt/dress;Thread/unthread right sleeve of pullover shirt/dresss;Put head through opening of pull over shirt/dress;Pull shirt over trunk;Thread/unthread right sleeve of front closure shirt/dress;Thread/unthread left sleeve of front closure shirt/dress;Button/unbutton shirt;Pull shirt around back of front closure shirt/dress Upper body dressing/undressing: 5: Set-up assist to: Obtain clothing/put away FIM - Lower Body Dressing/Undressing Lower body dressing/undressing steps patient completed: Thread/unthread right underwear leg;Thread/unthread left underwear leg;Pull underwear up/down;Thread/unthread right pants leg;Thread/unthread left pants leg;Pull pants up/down;Fasten/unfasten pants;Don/Doff right sock;Don/Doff left sock Lower body dressing/undressing: 5: Supervision: Safety issues/verbal cues        FIM - Bed/Chair Transfer Bed/Chair Transfer Assistive Devices: Bed rails;Arm rests Bed/Chair Transfer: 5: Bed > Chair or W/C: Supervision (verbal cues/safety issues)  FIM - Locomotion: Wheelchair Locomotion: Wheelchair: 0: Activity did not occur FIM - Locomotion: Ambulation Ambulation/Gait Assistance: 4: Min assist Locomotion: Ambulation: 6: Travels 150 ft or more with assistive device/no helper  Comprehension Comprehension Mode: Auditory Comprehension: 5-Follows basic conversation/direction: With extra time/assistive device  Expression Expression Mode: Verbal Expression: 5-Expresses basic needs/ideas: With extra time/assistive device  Social Interaction Social Interaction: 5-Interacts appropriately 90% of the time - Needs monitoring or encouragement for participation or interaction.  Problem Solving Problem Solving:  5-Solves basic problems: With no assist  Memory Memory: 6-More than reasonable amt of time  Medical Problem List and Plan:  1. DVT Prophylaxis/Anticoagulation: Pharmaceutical: Coumadin -hh f/u 2. Pain Management: N/A  3. Mood: Looks lot better cognitively. No signs of distress noted.   4.  Neuropsych: This patient is capable of making decisions on his/her own behalf.  5. E. Coli UTI: Treated with IV rocephin X 5 days. Keflex complete  -would like to dc condom cath  6. Chronic A Fib: RVR due to fever resolved. Continue Cardizem and atenolol for rate control with good results 7. Grade 2 diastolic dysfunction: . Continue lasix.  8. Seizure disorder: on Keppra 1000mg  bid.  9. Borderline diabetes: Monitor BS with AC/HS cbg checks. Reasonably controlled    10. OSA: resume CPAP. Patient educated on importance of compliance/use.   LOS (Days) 3 A FACE TO FACE EVALUATION WAS PERFORMED  Terry Robertson T 03/30/2012 6:56 AM

## 2012-03-30 NOTE — Progress Notes (Signed)
patient placed on CPAP. Tolerating well with no O2 at this time. Sats 97% with patient able to removed mask if needed

## 2012-04-02 NOTE — Patient Care Conference (Signed)
Inpatient RehabilitationTeam Conference and Plan of Care Update Date: 03/29/2012   Time: 9:00 AM    Patient Name: Terry Robertson      Medical Record Number: [id number]  Date of Birth: [birth year] Sex: Male         Room/Bed: 4033/4033-01 Payor Info: Payor: Joette Catching MEDICARE  Plan: [facility] MEDICARE  Product Type: *No Product type*     Admitting Diagnosis: Seizures  Admit Date/Time:  03/27/2012  4:41 PM Admission Comments: No comment available   Primary Diagnosis:  CVA (cerebral infarction) Principal Problem: CVA (cerebral infarction)  Patient Active Problem List   Diagnosis Date Noted  . CVA (cerebral infarction) 03/28/2012  . Pulmonary edema 03/22/2012  . Fever, unclear etiology 03/19/2012  . Acute respiratory failure, resolved 11/28/2011  . Altered mental status, suspect post ictal 11/28/2011  . Seizure, resolved 11/28/2011  . Hx of AF, NSR presently 08/12/2011  . Acute stroke - possibly  cardioembolic (08/09/2011) 08/09/2011  . Diastolic dysfunction: Grade 2 13/10/6576  . Thrombocytopenia, chronic 08/07/2011  . Hypotension - resolved; due to poor PO intake with continued Antihypertensive therapy 08/05/2011  . CAD (coronary artery disease) PCI to circumflex in 1997.  100% occluded RCA 08/05/2011  . Morbid obesity 08/05/2011  . Myelodysplastic syndrome 08/05/2011  . Dyslipidemia 08/05/2011  . HTN (hypertension), controlled 08/05/2011  . S/P TURP Oct 2012 08/05/2011  . DM type 2 (diabetes mellitus, type 2) 08/05/2011    Expected Discharge Date: Expected Discharge Date: 03/30/12  Team Members Present: Physician leading conference: Dr. Faith Rogue Social Worker Present: Amada Jupiter, LCSW Nurse Present: Laural Roes, RN PT Present: Reggy Eye, PT OT Present: [name], OT     Current Status/Progress Goal Weekly Team Focus  Medical   left thalamic infarct, seizure, afib with rvr, uti  control seizures, HR control  rx uti, stabilize medically for dc     Bowel/Bladder   Continent bowel/bladder,   mod independent continent bowel/bladder  toilet q3hours   Swallow/Nutrition/ Hydration             ADL's   Mod I   Mod I   d/c planning, ordering equipment   Mobility   mod I  mod I  d/c planning   Communication             Safety/Cognition/ Behavioral Observations            Pain   no c/o pain  no c/o pain  monitor for new onset of pain   Skin   C.D.I.  C.D.I.  assess skin qshift, protective skincare products and turning/repostitioning as needed     Rehab Goals Patient on target to meet rehab goals: Yes *See Interdisciplinary Assessment and Plan and progress notes for long and short-term goals  Barriers to Discharge: none    Possible Resolutions to Barriers:  none    Discharge Planning/Teaching Needs:  home with girlfriend and son to provide 24/7 assistance if needed      Team Discussion:  No concerns for patient to d/c tomorrow.  Admitted at high functional level.  Revisions to Treatment Plan:  None   Continued Need for Acute Rehabilitation Level of Care: The patient requires daily medical management by a physician with specialized training in physical medicine and rehabilitation for the following conditions: Daily direction of a multidisciplinary physical rehabilitation program to ensure safe treatment while eliciting the highest outcome that is of practical value to the patient.: Yes Daily medical management of patient stability for increased activity during participation  in an intensive rehabilitation regime.: Yes Daily analysis of laboratory values and/or radiology reports with any subsequent need for medication adjustment of medical intervention for : Cardiac problems;Neurological problems (UTI)  Lucas Exline 04/02/2012, 9:59 AM

## 2012-04-08 ENCOUNTER — Encounter: Payer: Self-pay | Admitting: Cardiology

## 2012-04-08 NOTE — Progress Notes (Signed)
Discharge summary # [phone]

## 2012-04-09 NOTE — Discharge Summary (Signed)
NAME:  Terry, Robertson NO.:  [id number]  MEDICAL RECORD NO.:  [id number]  LOCATION:  4033                         FACILITY:  [facility]  PHYSICIAN:  Delle Reining, P.A.      DATE OF BIRTH:  December 20, 1928  DATE OF ADMISSION:  03/27/2012 DATE OF DISCHARGE:  03/30/2012                              DISCHARGE SUMMARY   DISCHARGE DIAGNOSES: 1. Left thrombotic infarct with recurrent seizures. 2. Escherichia coli urinary tract infection. 3. Borderline diabetes. 4. Seizure disorder. 5. Obstructive sleep apnea.  HISTORY OF PRESENT ILLNESS:  [name] is an 79 year old male with history of prior CVA, seizure disorder who was admitted March 16, 2012, with reduced verbal outlook and eyes deviated to right side.  He had a witnessed focal seizure activity involving right arm, right leg, and right face while in Radiology and eyes were deviated tonically to the right as well.  He was loaded with Keppra after initial dose of Ativan.  Neurology was consulted for input and recommended increasing keppra to 1000 mg b.i.d.  MRI had difficulty excluding acute infarct in dorsal left thalamus. Neuro consulted and felt that stroke was not associated  With current symptoms and likely thrombotic.  The patient was noted to be febrile on January 15 due to E. coli UTI and was started on IV Rocephin. Therapies are ongoing and patient is noted to be deconditioned with poor safety as well as problems with motor planning.  Therapy team recommended CIR, therefore patient was admitted for further therapies.  PAST MEDICAL HISTORY:  Coronary artery disease, DM type 2-diet controlled, hypertension, strokes, and seizure.  FUNCTIONAL HISTORY:  The patient was independent for mobility.  He requires some supervision setup for ADLs.  FUNCTIONAL STATUS:  The patient was min to mod assist for bed mobility, min assist for sit-to-stand transfers plus min guard assist for ambulating 200 feet with rolling  walker showing poor posture.  He was +2 total assist for lower body bathing, mod assist for lower body dressing.  RECENT LABS:  Check of lytes revealed sodium 140, potassium 4.6, chloride 102, CO2 27, BUN 13, creatinine 0.84, glucose 107.  LFTs were noted to be slightly elevated with AST 44, ALT 57, total protein 8.9, albumin at 2.8.  CBC revealed hemoglobin 10.9, hematocrit 34.3, white count 4.3, platelets 186.  The PT/INR at the time of discharge were 19.4 and 1.70.  HOSPITAL COURSE:  [name] was admitted to Rehab on March 27, 2012, for inpatient therapies to consist of PT, OT, and speech therapy at least 3 hours 5 days a week.  Past admission physiatrist, rehab RN, and therapy team worked together to provide customized collaborative interdisciplinary care.  Blood pressures were monitored on BID basis  and were well controlled. Blood sugars were checked on AC/HS basis and  have ranged from 90-130 on carb modified diet. The patient made good progress during his stay.  Physical Therapy worked on balance as well as overall mobility.  The patient progressed to being modified independent level for balance, for ambulation of household distances, for navigating  stairs as well as car transfers.    Speech Therapy worked with patient on  cognitive linguistic goals.  The patient was modified independent  for basic and functional communication.  He required min multimodal cuing for more mildly complex auditory comprehension and verbal expression.  It was  suspected that patient's cognitive linguistic abilities were at baseline, therefore no followup speech therapy needed past discharge.   OT has worked with patient on self-care tasks. The patient was able to perform bathing and dressing tasks at shower level with supervision past setup.  He required min verbal cues for safety.  He had difficulty donning socks, but reported using sock aide at home.  Education was done with patient's  care partner who will provide physical assistance for IADLs past discharge. On March 30, 2012, patient was discharged to home in improved condition.  DISCHARGE MEDICATIONS: 1. Lasix 20 mg p.o. per day. 2. Keppra 1000 mg p.o. b.i.d. 3. Coumadin 10 mg p.o. q. p.m. 4. Coated aspirin 81 mg p.o. per day. 5. Atenolol 12.5 mg p.o. per day. 6. Diltiazem 180 mg p.o. per day. 7. Lovaza 2 g p.o. b.i.d. 8. Zocor 20 mg p.o. per day. 9. Vitamin D 50,000 units on Wednesdays and Saturdays.  DIET:  Cardiac diet.  ACTIVITY LEVEL:  As tolerated.  No driving.  SPECIAL INSTRUCTIONS:  Intermittent supervision assistance, walk with walker.  Do not use lisinopril or Hytrin.  FOLLOWUP:  The patient to follow up with Dr. Dorothyann Peng on February 6 at 3:15.  Follow up with Dr. Thurmon Fair on January 28 for Pro-Time draw.  Follow up with Dr. Delia Heady in the next 3-4 weeks.  Follow up with Dr. Riley Kill as needed.     Delle Reining, P.A.     PL/MEDQ  D:  04/08/2012  T:  04/09/2012  Job:  [id number]  cc:   Candyce Churn. Allyne Gee, M.D. Thurmon Fair, MD

## 2012-05-21 ENCOUNTER — Ambulatory Visit: Payer: Self-pay | Admitting: Cardiology

## 2012-05-21 DIAGNOSIS — I4891 Unspecified atrial fibrillation: Secondary | ICD-10-CM

## 2012-05-21 DIAGNOSIS — Z7901 Long term (current) use of anticoagulants: Secondary | ICD-10-CM | POA: Insufficient documentation

## 2012-05-21 DIAGNOSIS — I639 Cerebral infarction, unspecified: Secondary | ICD-10-CM

## 2012-07-02 ENCOUNTER — Encounter: Payer: Self-pay | Admitting: *Deleted

## 2012-07-02 ENCOUNTER — Other Ambulatory Visit: Payer: Medicare Other | Admitting: Lab

## 2012-07-02 ENCOUNTER — Ambulatory Visit: Payer: Medicare Other | Admitting: Oncology

## 2012-07-03 ENCOUNTER — Telehealth: Payer: Self-pay | Admitting: Oncology

## 2012-07-03 ENCOUNTER — Other Ambulatory Visit: Payer: Medicare Other | Admitting: Lab

## 2012-07-09 ENCOUNTER — Telehealth: Payer: Self-pay

## 2012-07-09 NOTE — Telephone Encounter (Signed)
Keppra refill request.  Patient has not been seen in the office.  They will contact PCP for refill.

## 2012-07-24 ENCOUNTER — Encounter: Payer: Self-pay | Admitting: Physician Assistant

## 2012-07-30 ENCOUNTER — Encounter: Payer: Self-pay | Admitting: Cardiology

## 2012-07-31 ENCOUNTER — Ambulatory Visit (INDEPENDENT_AMBULATORY_CARE_PROVIDER_SITE_OTHER): Payer: Medicare Other | Admitting: Pharmacist Clinician (PhC)/ Clinical Pharmacy Specialist

## 2012-07-31 ENCOUNTER — Ambulatory Visit (INDEPENDENT_AMBULATORY_CARE_PROVIDER_SITE_OTHER): Payer: Medicare Other | Admitting: Physician Assistant

## 2012-07-31 ENCOUNTER — Ambulatory Visit: Payer: Medicare Other | Admitting: Oncology

## 2012-07-31 ENCOUNTER — Other Ambulatory Visit: Payer: Medicare Other | Admitting: Lab

## 2012-07-31 ENCOUNTER — Encounter: Payer: Self-pay | Admitting: Physician Assistant

## 2012-07-31 VITALS — BP 148/80 | HR 73 | Ht 65.0 in | Wt 256.0 lb

## 2012-07-31 DIAGNOSIS — Z7901 Long term (current) use of anticoagulants: Secondary | ICD-10-CM

## 2012-07-31 DIAGNOSIS — I10 Essential (primary) hypertension: Secondary | ICD-10-CM

## 2012-07-31 DIAGNOSIS — I6340 Cerebral infarction due to embolism of unspecified cerebral artery: Secondary | ICD-10-CM

## 2012-07-31 DIAGNOSIS — I639 Cerebral infarction, unspecified: Secondary | ICD-10-CM

## 2012-07-31 DIAGNOSIS — I2510 Atherosclerotic heart disease of native coronary artery without angina pectoris: Secondary | ICD-10-CM

## 2012-07-31 DIAGNOSIS — I4891 Unspecified atrial fibrillation: Secondary | ICD-10-CM

## 2012-07-31 DIAGNOSIS — I5189 Other ill-defined heart diseases: Secondary | ICD-10-CM

## 2012-07-31 DIAGNOSIS — E785 Hyperlipidemia, unspecified: Secondary | ICD-10-CM

## 2012-07-31 DIAGNOSIS — I519 Heart disease, unspecified: Secondary | ICD-10-CM

## 2012-07-31 LAB — POCT INR: INR: 2.2

## 2012-07-31 NOTE — Assessment & Plan Note (Signed)
INR will be checked today 

## 2012-07-31 NOTE — Assessment & Plan Note (Signed)
I discussed dietary changes with the patient.  Without diagnoses of diabetes, dietary consult would not be covered.

## 2012-07-31 NOTE — Patient Instructions (Signed)
Followup in 6 months with Dr. Herbie Baltimore

## 2012-07-31 NOTE — Assessment & Plan Note (Addendum)
Currently appears euvolemic and well compensated. No symptoms of CHF: shortness of breath orthopnea PND lower extremity edema.

## 2012-07-31 NOTE — Progress Notes (Signed)
Date:  07/31/2012   ID:  Terry Robertson, DOB 20-Sep-1928, MRN [id number]  PCP:  Terry Aliment, MD  Primary Cardiologist:  Dr. Herbie Robertson   History of Present Illness: Terry Robertson is a 79 y.o. male was morbidly obese and has a history of CVAs, obstructive sleep apnea, paroxysmal atrial fibrillation on Coumadin, coronary artery disease status post PCI to the LAD and circumflex in 1997. Last heart catheterization in 2008 showed widely patent stents in the circumflex and LAD. He had a mid RCA lesion and was totally occluded distally. This is treated medically. Most recent 2-D echocardiogram was June 2013 showed ejection fraction 55-65% with grade 2 diastolic dysfunction. He has a dilated mildly dilated left atrium. History also includes hypertension, dyslipidemia, BPH, frequent PVCs.  Patient presents today for his six-month followup which is technically only 4 months since his last office visit. He reports no complaints at this time. He spent this past weekend at a family reunion from [place] weekend and had a good time. He denies nausea, vomiting, fever, chest pain, shortness of breath, orthopnea, PND, lower extremity edema, abdominal pain, hematochezia, melena, hematuria.   Wt Readings from Last 3 Encounters:  07/31/12 256 lb (116.121 kg)  03/29/12 232 lb (105.235 kg)  03/25/12 247 lb 1.6 oz (112.084 kg)     Past Medical History  Diagnosis Date  . Coronary artery disease     status post remote PCI to the LAD and circumflex in 1997, heart cath in 2008 showed widely patent stents in the circumflex and LAD.  RCA had a mid lesion and then was totally occluded distally.  . Diabetes mellitus   . Hypertension   . Stroke 08/2011, 03/2012    L MCA  . Seizures   . OSA on CPAP   . Paroxysmal atrial fibrillation     on coumadin  . Obesity, morbid     BMI  . Heart attack 1997    inferior MI with left circumflex stent  . BPH (benign prostatic hypertrophy)     TURP in 12/2010  . Myelodysplastic syndrome      w/mild anemia an neutropenia    Current Outpatient Prescriptions  Medication Sig Dispense Refill  . aspirin EC 81 MG tablet Take 81 mg by mouth daily.      Marland Kitchen atenolol (TENORMIN) 25 MG tablet Take 12.5 mg by mouth daily.      Marland Kitchen diltiazem (CARDIZEM CD) 180 MG 24 hr capsule Take 1 capsule (180 mg total) by mouth daily.  30 capsule  5  . fish oil-omega-3 fatty acids 1000 MG capsule Take 1 g by mouth daily.      . furosemide (LASIX) 20 MG tablet Take 1 tablet (20 mg total) by mouth daily with breakfast.  30 tablet  1  . HYDROcodone-acetaminophen (NORCO/VICODIN) 5-325 MG per tablet Take 1 tablet by mouth as needed for pain.      Marland Kitchen levETIRAcetam (KEPPRA) 1000 MG tablet Take 500 mg by mouth at bedtime.      . nitroGLYCERIN (NITROSTAT) 0.4 MG SL tablet Place 0.4 mg under the tongue every 5 (five) minutes as needed for chest pain.      Marland Kitchen omega-3 acid ethyl esters (LOVAZA) 1 G capsule Take 2 g by mouth 2 (two) times daily.       . simvastatin (ZOCOR) 20 MG tablet Take 1 tablet (20 mg total) by mouth daily.  30 tablet  0  . terazosin (HYTRIN) 2 MG capsule Take 2 mg by mouth at bedtime.      Marland Kitchen  Vitamin D, Ergocalciferol, (DRISDOL) 50000 UNITS CAPS Take 50,000 Units by mouth 2 (two) times a week. Takes 1 capsule on Wednesday and Saturday every week      . warfarin (COUMADIN) 10 MG tablet Take 1 tablet (10 mg total) by mouth daily with supper.       No current facility-administered medications for this visit.    Allergies:   No Known Allergies  Social History:  The patient  reports that he quit smoking about 24 years ago. His smoking use included Cigarettes. He smoked 0.00 packs per day. He does not have any smokeless tobacco history on file. He reports that he does not drink alcohol or use illicit drugs.   ROS:  Please see the history of present illness.  All other systems reviewed and negative.   PHYSICAL EXAM: VS:  BP 148/80  Pulse 73  Ht 5\' 5"  (1.651 m)  Wt 256 lb (116.121 kg)  BMI 42.6  kg/m2 Morbidly obese, well developed, in no acute distress HEENT: Pupils are equal round react to light accommodation extraocular movements are intact.  Neck: no JVDNo cervical lymphadenopathy. Cardiac: Irregularly irregular rhythm without murmurs rubs or gallops. Lungs:  clear to auscultation bilaterally, no wheezing, rhonchi or rales Abd: soft, nontender, positive bowel sounds all quadrants Ext: no lower extremity edema.  2+ radial and dorsalis pedis pulses. Skin: warm and dry Neuro:  Grossly normal  EKG:  Sinus rhythm quadrigeminy. Rate 72 beats per minute nonspecific T wave changes, left atrial enlargement  ASSESSMENT AND PLAN:  Problem List Items Addressed This Visit   CAD (coronary artery disease) PCI to circumflex in 1997.  100% occluded RCA - Primary     Left coronary catheterization was 2008 he had a widely patent stents in the circumflex and LAD. RCA had a mid lesion and was totally occluded distally this was treated medically. Patient reports no anginal symptoms in the last 3 months. Currently on aspirin beta blocker statin .    Relevant Orders      EKG 12-Lead   Morbid obesity     I discussed dietary changes with the patient.  Without diagnoses of diabetes, dietary consult would not be covered.    Dyslipidemia      Currently treated with simvastatin and lovaza.  Last lipid pane well-controlled l: June 2013  Results for Terry Robertson (MRN [id number])   Ref. Range 08/10/2011 05:18  Cholesterol Latest Range: 0-200 mg/dL 409  Triglycerides Latest Range: <150 mg/dL 811  HDL Latest Range: >39 mg/dL 27 (L)  LDL (calc) Latest Range: 0-99 mg/dL 52  VLDL Latest Range: 0-40 mg/dL 21  Total CHOL/HDL Ratio No range found 3.7      HTN (hypertension), controlled     Patient systolic pressures in the 140s where Dr. Herbie Robertson had suggested we tarrget for. I'll make no changes to his therapy at this time    Diastolic dysfunction: Grade 2     Currently appears euvolemic and well  compensated. No symptoms of CHF: shortness of breath orthopnea PND lower extremity edema.      Long term (current) use of anticoagulants     INR will be checked today    Relevant Orders      INR/PT     Patient will follow up with Dr. Herbie Robertson in 6 months time.

## 2012-07-31 NOTE — Assessment & Plan Note (Addendum)
Left coronary catheterization was 2008 he had a widely patent stents in the circumflex and LAD. RCA had a mid lesion and was totally occluded distally this was treated medically. Patient reports no anginal symptoms in the last 3 months. Currently on aspirin beta blocker statin .

## 2012-07-31 NOTE — Assessment & Plan Note (Addendum)
Patient systolic pressures in the 140s where Dr. Herbie Baltimore had suggested we tarrget for. I'll make no changes to his therapy at this time

## 2012-07-31 NOTE — Assessment & Plan Note (Addendum)
Currently treated with simvastatin and lovaza.  Last lipid pane well-controlled l: June 2013  Results for AQIB, LOUGH (MRN [id number])   Ref. Range 08/10/2011 05:18  Cholesterol Latest Range: 0-200 mg/dL 409  Triglycerides Latest Range: <150 mg/dL 811  HDL Latest Range: >39 mg/dL 27 (L)  LDL (calc) Latest Range: 0-99 mg/dL 52  VLDL Latest Range: 0-40 mg/dL 21  Total CHOL/HDL Ratio No range found 3.7

## 2012-08-20 ENCOUNTER — Ambulatory Visit (INDEPENDENT_AMBULATORY_CARE_PROVIDER_SITE_OTHER): Payer: No Typology Code available for payment source | Admitting: Nurse Practitioner

## 2012-08-20 ENCOUNTER — Encounter: Payer: Self-pay | Admitting: Nurse Practitioner

## 2012-08-20 VITALS — BP 150/86 | HR 71 | Ht 64.0 in | Wt 256.0 lb

## 2012-08-20 DIAGNOSIS — I639 Cerebral infarction, unspecified: Secondary | ICD-10-CM

## 2012-08-20 DIAGNOSIS — R569 Unspecified convulsions: Secondary | ICD-10-CM | POA: Insufficient documentation

## 2012-08-20 DIAGNOSIS — I6350 Cerebral infarction due to unspecified occlusion or stenosis of unspecified cerebral artery: Secondary | ICD-10-CM

## 2012-08-20 MED ORDER — LEVETIRACETAM 500 MG PO TABS: 500.0000 mg | ORAL_TABLET | Freq: Two times a day (BID) | ORAL | Status: DC

## 2012-08-20 NOTE — Progress Notes (Signed)
HPI: Patient returns for followup after his last visit 02/20/2012. He has a history of stroke and seizure . He was admitted to the hospital 08/05/11 for 2 day history of weakness, night sweats and CP. On admission he was hypotensive 67/40 labs showed elevated BUN and creatinine. 2 D echo showed EF 55-65% with normal wall motion and grade 2 diastolic dysfunction. On 08/07/2011 patient converted to A fib with RVR patient was given heparin bolus followed by heparin drip and diltiazem. 08/09/11 patient was noted to be more confused with speech difficulty. CT head obtained demonstrating 3 cm acute non hemorrhagic left parietal MCA territory infarction without hemorrhage or significant mass effect.  He was initially place on IV heparin, this was changed to PO coumadin with asa bridge    MRI confirmed acute moderate sized non hemorrhagic infarct extends from the posterior left opercular region into the posterior left frontal - parietal lobe. US carotid, No significant extracranial carotid artery stenosis demonstrated. Vertebrals are patent with antegrade flow.  He was noted on 9/23 to be driving erratically, went home and EMS was notified and found him sitting in a chair unable to speak but awake with right sided gaze, right arm twitching. In ED upon arrival, was noted to have seizure. Further decompensated and required intubation and  ICU admit.  Repeat MRI brain  Remote left parietal lobe infarct.  On diffusion sequence without evidence of an acute infarct.  EEG normal diffusely slow  without any evidence of seizure  He is taking keppra 500mg  bid, tolerating it well, no recurrent seizure, mild memory loss, ambulate with cane. Does not exercise. No further stroke activity     ROS:  negative  Physical Exam General: well developed, obese male , seated, in no evident distress Head: head normocephalic and atraumatic. Oropharynx benign Neck: supple with no carotid  bruits Cardiovascular: regular rate and rhythm,  no murmurs  Neurologic Exam Mental Status: Awake and fully alert. Oriented to place and time. Follows all commands. Speech and language normal.   Cranial Nerves: Fundoscopic exam reveals sharp disc margins. Pupils equal, briskly reactive to light. Extraocular movements full without nystagmus. Visual fields full to confrontation. Hearing intact and symmetric to finger snap. Facial sensation intact. Face, tongue, palate move normally and symmetrically. Neck flexion and extension normal.  Motor: Slight right arm fixation, no significant right leg weakness, left side normal  Sensory.: Decreased light touch, pinprick and vibratory .  Coordination: Rapid alternating movements normal in all extremities. Finger-to-nose and heel-to-shin performed accurately bilaterally. No dysmetria Gait and Station: Arises from chair without difficulty. Stance is wide-based and ambulated 70 feet with cane mildly unsteady gait, no difficulty with turns Reflexes: 2+ and symmetric except Achilles 1+. Toes downgoing.     ASSESSMENT: Medical history of hypertension,  hyperlipidemia, stroke, seizure , chronic Coumadin treatment for atrial fibrillation     PLAN: Pt to continue Keppra at current dose, will renew Continue Coumadin for secondary stroke prevention Exercise by walking short distances Given examples of exercises for the mind as well Followup in 6 months  Nilda Riggs, GNP-BC APRN

## 2012-08-20 NOTE — Patient Instructions (Addendum)
Pt to continue Keppra at current dose, will renew Continue Coumadin for secondary stroke prevention Exercise by walking short distances Given examples of exercises for the mind as well Followup in 6 months

## 2012-09-13 ENCOUNTER — Ambulatory Visit (INDEPENDENT_AMBULATORY_CARE_PROVIDER_SITE_OTHER): Payer: Medicare Other | Admitting: Pharmacist Clinician (PhC)/ Clinical Pharmacy Specialist

## 2012-09-13 VITALS — BP 160/84 | HR 72

## 2012-09-13 DIAGNOSIS — I4891 Unspecified atrial fibrillation: Secondary | ICD-10-CM

## 2012-09-13 DIAGNOSIS — Z7901 Long term (current) use of anticoagulants: Secondary | ICD-10-CM

## 2012-09-13 DIAGNOSIS — I6340 Cerebral infarction due to embolism of unspecified cerebral artery: Secondary | ICD-10-CM

## 2012-09-13 DIAGNOSIS — I639 Cerebral infarction, unspecified: Secondary | ICD-10-CM

## 2012-09-13 LAB — POCT INR: INR: 2.2

## 2012-10-14 ENCOUNTER — Ambulatory Visit (INDEPENDENT_AMBULATORY_CARE_PROVIDER_SITE_OTHER): Payer: Medicare Other | Admitting: Pharmacist Clinician (PhC)/ Clinical Pharmacy Specialist

## 2012-10-14 VITALS — BP 160/82 | HR 84

## 2012-10-14 DIAGNOSIS — Z7901 Long term (current) use of anticoagulants: Secondary | ICD-10-CM

## 2012-10-14 DIAGNOSIS — I4891 Unspecified atrial fibrillation: Secondary | ICD-10-CM

## 2012-10-14 DIAGNOSIS — I6340 Cerebral infarction due to embolism of unspecified cerebral artery: Secondary | ICD-10-CM

## 2012-10-14 DIAGNOSIS — I639 Cerebral infarction, unspecified: Secondary | ICD-10-CM

## 2012-11-08 ENCOUNTER — Telehealth: Payer: Self-pay | Admitting: Pharmacist Clinician (PhC)/ Clinical Pharmacy Specialist

## 2012-11-08 MED ORDER — WARFARIN SODIUM 5 MG PO TABS: ORAL_TABLET | ORAL | Status: DC

## 2012-11-08 NOTE — Telephone Encounter (Signed)
Message forwarded to K. Alvstad, PharmD.  

## 2012-11-08 NOTE — Telephone Encounter (Signed)
Needs refill on Coumadin

## 2012-11-08 NOTE — Telephone Encounter (Signed)
Please refill Coumadin---[facility] on [address].  Patient only has enough for tomorrow.  Let [name] know when this has been called in.

## 2012-11-11 ENCOUNTER — Ambulatory Visit (INDEPENDENT_AMBULATORY_CARE_PROVIDER_SITE_OTHER): Payer: Medicare Other | Admitting: Pharmacist Clinician (PhC)/ Clinical Pharmacy Specialist

## 2012-11-11 VITALS — BP 140/76 | HR 88

## 2012-11-11 DIAGNOSIS — Z7901 Long term (current) use of anticoagulants: Secondary | ICD-10-CM

## 2012-11-11 DIAGNOSIS — I6340 Cerebral infarction due to embolism of unspecified cerebral artery: Secondary | ICD-10-CM

## 2012-11-11 DIAGNOSIS — I4891 Unspecified atrial fibrillation: Secondary | ICD-10-CM

## 2012-11-11 DIAGNOSIS — I639 Cerebral infarction, unspecified: Secondary | ICD-10-CM

## 2012-11-11 LAB — POCT INR: INR: 3.2

## 2012-12-09 ENCOUNTER — Ambulatory Visit (INDEPENDENT_AMBULATORY_CARE_PROVIDER_SITE_OTHER): Payer: Medicare Other | Admitting: Pharmacist Clinician (PhC)/ Clinical Pharmacy Specialist

## 2012-12-09 VITALS — BP 162/80 | HR 88

## 2012-12-09 DIAGNOSIS — I6340 Cerebral infarction due to embolism of unspecified cerebral artery: Secondary | ICD-10-CM

## 2012-12-09 DIAGNOSIS — Z7901 Long term (current) use of anticoagulants: Secondary | ICD-10-CM

## 2012-12-09 DIAGNOSIS — I4891 Unspecified atrial fibrillation: Secondary | ICD-10-CM

## 2012-12-09 DIAGNOSIS — I639 Cerebral infarction, unspecified: Secondary | ICD-10-CM

## 2012-12-09 LAB — POCT INR: INR: 3

## 2012-12-17 ENCOUNTER — Telehealth: Payer: Self-pay | Admitting: Nurse Practitioner

## 2012-12-18 NOTE — Telephone Encounter (Signed)
This patient is not seen here for pain management. Notify the prescribing MD please.

## 2012-12-18 NOTE — Telephone Encounter (Signed)
Called and spoke with son and he said that patient is not home at present but he is getting up in middle of the night with pain, having hard time turning his head and having feet pain as well, has been going on for a couple of months but has gotten worse within the the last couple of days.

## 2012-12-20 NOTE — Telephone Encounter (Addendum)
I called and spoke to other son.  Pt not available.  I told him that we are seeing him for seizures/ post stroke  Have not prescribed pain medication for him or have seen him for his feet pain.  He would need to see his pcp for evaluation.  He then stated he has appt for pcp on Monday.  If gets worse may need to go to urgent care.

## 2013-01-04 ENCOUNTER — Other Ambulatory Visit: Payer: Self-pay | Admitting: Neurology

## 2013-01-06 ENCOUNTER — Ambulatory Visit (INDEPENDENT_AMBULATORY_CARE_PROVIDER_SITE_OTHER): Payer: Medicare Other | Admitting: Pharmacist Clinician (PhC)/ Clinical Pharmacy Specialist

## 2013-01-06 VITALS — BP 176/80 | HR 84

## 2013-01-06 DIAGNOSIS — I4891 Unspecified atrial fibrillation: Secondary | ICD-10-CM

## 2013-01-06 DIAGNOSIS — I639 Cerebral infarction, unspecified: Secondary | ICD-10-CM

## 2013-01-06 DIAGNOSIS — I6340 Cerebral infarction due to embolism of unspecified cerebral artery: Secondary | ICD-10-CM

## 2013-01-06 DIAGNOSIS — Z7901 Long term (current) use of anticoagulants: Secondary | ICD-10-CM

## 2013-01-06 MED ORDER — WARFARIN SODIUM 5 MG PO TABS: ORAL_TABLET | ORAL | Status: DC

## 2013-01-07 ENCOUNTER — Other Ambulatory Visit: Payer: Self-pay | Admitting: Neurology

## 2013-01-07 MED ORDER — LEVETIRACETAM 500 MG PO TABS: 500.0000 mg | ORAL_TABLET | Freq: Two times a day (BID) | ORAL | Status: DC

## 2013-02-03 ENCOUNTER — Ambulatory Visit (INDEPENDENT_AMBULATORY_CARE_PROVIDER_SITE_OTHER): Payer: Medicare Other | Admitting: Pharmacist Clinician (PhC)/ Clinical Pharmacy Specialist

## 2013-02-03 VITALS — BP 154/80 | HR 68

## 2013-02-03 DIAGNOSIS — Z7901 Long term (current) use of anticoagulants: Secondary | ICD-10-CM

## 2013-02-03 DIAGNOSIS — I639 Cerebral infarction, unspecified: Secondary | ICD-10-CM

## 2013-02-03 DIAGNOSIS — I4891 Unspecified atrial fibrillation: Secondary | ICD-10-CM

## 2013-02-03 DIAGNOSIS — I6340 Cerebral infarction due to embolism of unspecified cerebral artery: Secondary | ICD-10-CM

## 2013-02-10 ENCOUNTER — Ambulatory Visit: Payer: No Typology Code available for payment source | Admitting: Podiatry

## 2013-02-12 ENCOUNTER — Encounter: Payer: Self-pay | Admitting: Podiatry

## 2013-02-12 ENCOUNTER — Ambulatory Visit (INDEPENDENT_AMBULATORY_CARE_PROVIDER_SITE_OTHER): Payer: Medicare Other | Admitting: Podiatry

## 2013-02-12 VITALS — BP 160/90 | HR 82 | Resp 16 | Ht 65.0 in | Wt 255.0 lb

## 2013-02-12 DIAGNOSIS — M79609 Pain in unspecified limb: Secondary | ICD-10-CM

## 2013-02-12 DIAGNOSIS — E1149 Type 2 diabetes mellitus with other diabetic neurological complication: Secondary | ICD-10-CM

## 2013-02-12 DIAGNOSIS — B351 Tinea unguium: Secondary | ICD-10-CM

## 2013-02-12 NOTE — Patient Instructions (Signed)
Remove bandage on fourth right toe in 24 hours and continue to apply a topical antibiotic ointment daily to the fourth right toe, cover with Band-Aid until healed.

## 2013-02-12 NOTE — Progress Notes (Signed)
   Subjective:    Patient ID: Terry Robertson, male    DOB: May 14, 1928, 79 y.o.   MRN: [id number] "Check his feet and nails," stated patient's friend."  Diabetes He presents for his initial (Diabetic Foot Care, Debridement B/L) diabetic visit. He has type 2 diabetes mellitus. His disease course has been stable. Hypoglycemia symptoms include seizures. (Toenails crumble and painful) Associated symptoms include polydipsia. Current diabetic treatment includes diet. His weight is stable. He never participates in exercise.      Review of Systems  Cardiovascular: Positive for leg swelling.       Calf pain with walking  Endocrine: Positive for polydipsia.  Musculoskeletal: Positive for back pain.  Skin:       Change in nails  Neurological: Positive for seizures.  All other systems reviewed and are negative.       Objective:   Physical Exam 79 year old black male presents with friend and appears to understand conversation but speaks with hesitating speech pattern.  Vascular: The DP and PT pulses are two over four bilaterally  Neurological: Sensation detained in monofilament wire intact 2/10 locations right, 4/10 locations left. Vibratory sensation diminished bilaterally  Dermatological: Hypertrophic, discolored, elongated, painful toenails x10. Dry scaling skin plantarly, bilaterally.  Musculoskeletal: Medium arch contour noted bilaterally. HAV deformity left.       Assessment & Plan:   Assessment: Symptomatic onychomycoses x10 Sensory neuropathy bilaterally  Plan: All 10 toenails are debrided back. Slight amount of bleeding noted distal fourth right toe after debridement. Triple Antibiotic ointment and a protective gauze bandage applied to the fourth right toe, with instructions for patient to continue daily bandage changes the fourth right toe until healed.

## 2013-02-16 ENCOUNTER — Other Ambulatory Visit: Payer: Self-pay | Admitting: Physician Assistant

## 2013-02-17 NOTE — Telephone Encounter (Signed)
Rx was sent to pharmacy electronically. 

## 2013-02-19 ENCOUNTER — Encounter: Payer: Self-pay | Admitting: Nurse Practitioner

## 2013-02-19 ENCOUNTER — Ambulatory Visit (INDEPENDENT_AMBULATORY_CARE_PROVIDER_SITE_OTHER): Payer: Medicare Other | Admitting: Nurse Practitioner

## 2013-02-19 VITALS — BP 138/84 | HR 80 | Ht 65.0 in | Wt 257.0 lb

## 2013-02-19 DIAGNOSIS — I6350 Cerebral infarction due to unspecified occlusion or stenosis of unspecified cerebral artery: Secondary | ICD-10-CM

## 2013-02-19 DIAGNOSIS — R569 Unspecified convulsions: Secondary | ICD-10-CM

## 2013-02-19 DIAGNOSIS — I639 Cerebral infarction, unspecified: Secondary | ICD-10-CM

## 2013-02-19 MED ORDER — LEVETIRACETAM 500 MG PO TABS: 500.0000 mg | ORAL_TABLET | Freq: Two times a day (BID) | ORAL | Status: DC

## 2013-02-19 NOTE — Progress Notes (Signed)
[facility]  PATIENT: Terry Robertson DOB: 06-10-1928   REASON FOR VISIT: follow up for seizures and history of stroke   HISTORY OF PRESENT ILLNESS:Terry Robertson, 79 year old black male returns for followup history of hemorrhagic infarct extending from the posterior left region to the posterior left frontal parietal lobe this occurred in June of 2013. He has not had further stroke symptoms. He also has a history history of seizures and is currently on Keppra 500 twice daily without recurrent seizure events. He does not exercise. He ambulates with a cane, no falls  since last seen. He is on chronic Coumadin therapy with minimal bruising.  HISTORY: He has a history of stroke and seizure . He was admitted to the hospital 08/05/11 for 2 day history of weakness, night sweats and CP. On admission he was hypotensive 67/40 labs showed elevated BUN and creatinine. 2 D echo showed EF 55-65% with normal wall motion and grade 2 diastolic dysfunction. On 08/07/2011 patient converted to A fib with RVR patient was given heparin bolus followed by heparin drip and diltiazem. 08/09/11 patient was noted to be more confused with speech difficulty. CT head obtained demonstrating 3 cm acute non hemorrhagic left parietal MCA territory infarction without hemorrhage or significant mass effect. He was initially place on IV heparin, this was changed to PO coumadin with asa bridge  MRI confirmed acute moderate sized non hemorrhagic infarct extends from the posterior left opercular region into the posterior left frontal - parietal lobe.  US carotid, No significant extracranial carotid artery stenosis demonstrated. Vertebrals are patent with antegrade flow.  He was noted on 9/23 to be driving erratically, went home and EMS was notified and found him sitting in a chair unable to speak but awake with right sided gaze, right arm twitching. In ED upon arrival, was noted to have seizure. Further decompensated and required  intubation and ICU admit.  Repeat MRI brain Remote left parietal lobe infarct. On diffusion sequence without evidence of an acute infarct.  EEG normal diffusely slow without any evidence of seizure  He is taking keppra 500mg  bid, tolerating it well, no recurrent seizure, mild memory loss, ambulate with cane. Does not exercise. No further stroke activity  REVIEW OF SYSTEMS: Full 14 system review of systems performed and notable only for those listed, all others are neg:  Constitutional: N/A  Cardiovascular: N/A  Ear/Nose/Throat: N/A  Skin: N/A  Eyes: N/A  Respiratory: N/A  Gastroitestinal: N/A  Hematology/Lymphatic: N/A  Endocrine: N/A Musculoskeletal:back pain treated by PCP Allergy/Immunology: N/A  Neurological: N/A Psychiatric: N/A   ALLERGIES: No Known Allergies  HOME MEDICATIONS: Outpatient Prescriptions Prior to Visit  Medication Sig Dispense Refill  . aspirin EC 81 MG tablet Take 81 mg by mouth daily.      Marland Kitchen atenolol (TENORMIN) 25 MG tablet TAKE 1/2 TABLET BY MOUTH EVERY DAY  15 tablet  5  . fish oil-omega-3 fatty acids 1000 MG capsule Take 1 g by mouth daily.      . furosemide (LASIX) 20 MG tablet Take 1 tablet (20 mg total) by mouth daily with breakfast.  30 tablet  1  . HYDROcodone-acetaminophen (NORCO/VICODIN) 5-325 MG per tablet Take 1 tablet by mouth as needed for pain.      Bess Harvest Ethyl (VASCEPA) 1 G CAPS Take 1 g by mouth daily.      Marland Kitchen levETIRAcetam (KEPPRA) 500 MG tablet Take 1 tablet (500 mg total) by mouth every 12 (twelve) hours.  60 tablet  6  .  nitroGLYCERIN (NITROSTAT) 0.4 MG SL tablet Place 0.4 mg under the tongue every 5 (five) minutes as needed for chest pain.      . simvastatin (ZOCOR) 20 MG tablet Take 1 tablet (20 mg total) by mouth daily.  30 tablet  0  . terazosin (HYTRIN) 2 MG capsule Take 2 mg by mouth at bedtime.      . Vitamin D, Ergocalciferol, (DRISDOL) 50000 UNITS CAPS Take 50,000 Units by mouth 2 (two) times a week. Takes 1 capsule on  Wednesday and Saturday every week      . warfarin (COUMADIN) 5 MG tablet Take 1 & 1/2 to 2 tablets by mouth daily as directed  50 tablet  5   No facility-administered medications prior to visit.    PAST MEDICAL HISTORY: Past Medical History  Diagnosis Date  . Coronary artery disease     status post remote PCI to the LAD and circumflex in 1997, heart cath in 2008 showed widely patent stents in the circumflex and LAD.  RCA had a mid lesion and then was totally occluded distally.  . Diabetes mellitus   . Hypertension   . Stroke 08/2011, 03/2012    L MCA  . Seizures   . OSA on CPAP   . Paroxysmal atrial fibrillation     on coumadin  . Obesity, morbid     BMI  . Heart attack 1997    inferior MI with left circumflex stent  . BPH (benign prostatic hypertrophy)     TURP in 12/2010  . Myelodysplastic syndrome     w/mild anemia an neutropenia    PAST SURGICAL HISTORY: Past Surgical History  Procedure Laterality Date  . Transthoracic echocardiogram  08/06/11    EF 55% to 65% with grade2 diastolic dysfunction/pseudonormal with mildly dilated left atrium, had evidence of elevated filling ressures in the left vientricle and left atrium  . Cardiac catheterization  02/08/07    widely patent stent in the circumflex and LAD.  RCA had a mid lesion and then was totally occluded distally. Cook bare-metal 3.5x20 mm stent  . Transurethral resection of prostate  12/2010    FAMILY HISTORY: Family History  Problem Relation Age of Onset  . Heart disease Mother   . Heart attack Father 2    SOCIAL HISTORY: History   Social History  . Marital Status: Widowed    Spouse Name: N/A    Number of Children: 9  . Years of Education: 11   Occupational History  . Not on file.   Social History Main Topics  . Smoking status: Former Smoker    Types: Cigarettes    Quit date: 03/08/1988  . Smokeless tobacco: Never Used  . Alcohol Use: No  . Drug Use: No  . Sexual Activity: Not on file   Other  Topics Concern  . Not on file   Social History Narrative   Pt is widowed father of 27, 28 grandchildren, 70 great-grandchildren and 3 great-great-grandchildren.  He does not get routine exercise.   Patient has a high school education.   Patient is right-handed.   Patient drinks one cup of coffee daily.     PHYSICAL EXAM  Filed Vitals:   02/19/13 1321  BP: 148/80  Pulse: 80  Height: 5\' 5"  (1.651 m)  Weight: 257 lb (116.574 kg)   Body mass index is 42.77 kg/(m^2).  Generalized: Well developed, obese male in no acute distress  Head: normocephalic and atraumatic,. Oropharynx benign  Neck: Supple, no carotid bruits  Cardiac: Regular rate rhythm, no murmur  Musculoskeletal: No deformity   Neurological examination   Mentation: Alert oriented to time, place, history taking. Follows all commands speech and language fluent  Cranial nerve II-XII: Pupils were equal round reactive to light extraocular movements were full, visual field were full on confrontational test. Facial sensation and strength were normal. hearing was intact to finger rubbing bilaterally. Uvula tongue midline. head turning and shoulder shrug were normal and symmetric.Tongue protrusion into cheek strength was normal. Motor: Slight right arm fixation no significant right leg weakness normal on the left  Sensory: decreased light touch, pinprick, and  vibration  Coordination: finger-nose-finger, heel-to-shin bilaterally, no dysmetria Reflexes: Brachioradialis 2/2, biceps 2/2, triceps 2/2, patellar 2/2, Achilles 1/1, plantar responses were flexor bilaterally. Gait and Station: Rising up from seated position without assistance, wide based stance,  ambulated 70 feet with a cane mildly unsteady gait and stooped posture.   DIAGNOSTIC DATA (LABS, IMAGING, TESTING) - I reviewed patient records, labs, notes, testing and imaging myself where available.  Lab Results  Component Value Date   WBC 4.3 03/28/2012   HGB 10.9*  03/28/2012   HCT 34.3* 03/28/2012   MCV 83.1 03/28/2012   PLT 186 03/28/2012      Component Value Date/Time   NA 140 03/28/2012 0615   NA 139 01/02/2012 1526   K 4.6 03/28/2012 0615   K 3.8 01/02/2012 1526   CL 102 03/28/2012 0615   CL 109* 01/02/2012 1526   CO2 27 03/28/2012 0615   CO2 26 01/02/2012 1526   GLUCOSE 107* 03/28/2012 0615   GLUCOSE 118* 01/02/2012 1526   BUN 13 03/28/2012 0615   BUN 12.0 01/02/2012 1526   CREATININE 0.84 03/28/2012 0615   CREATININE 1.0 01/02/2012 1526   CALCIUM 9.5 03/28/2012 0615   CALCIUM 9.2 01/02/2012 1526   PROT 8.9* 03/28/2012 0615   PROT 8.9* 01/02/2012 1526   ALBUMIN 2.8* 03/28/2012 0615   ALBUMIN 3.3* 01/02/2012 1526   AST 44* 03/28/2012 0615   AST 14 01/02/2012 1526   ALT 57* 03/28/2012 0615   ALT 8 01/02/2012 1526   ALKPHOS 89 03/28/2012 0615   ALKPHOS 52 01/02/2012 1526   BILITOT 0.3 03/28/2012 0615   BILITOT 0.40 01/02/2012 1526   GFRNONAA 80* 03/28/2012 0615   GFRAA >90 03/28/2012 0615    Lab Results  Component Value Date   HGBA1C 5.7* 03/20/2012    ASSESSMENT AND PLAN  79 y.o. year old male  has a past medical history of Coronary artery disease; Diabetes mellitus; Hypertension; Stroke (08/2011, 03/2012); Seizures; OSA on CPAP; Paroxysmal atrial fibrillation; Obesity, morbid; Heart attack (1997); here for followup stroke and seizures  Continue Keppra at current dose, will renew Continue Coumadin for secondary stroke prevention and atrial fib Exercise by walking short distances every day F/U in 6 months Nilda Riggs, [facility], [facility], APRN  [facility] [address] [place], [place] [address] [phone]

## 2013-02-19 NOTE — Patient Instructions (Signed)
Continue Keppra at current dose, will renew Continue Coumadin for secondary stroke prevention  Exercise by walking short distances every day F/U in 6 months

## 2013-03-02 ENCOUNTER — Other Ambulatory Visit: Payer: Self-pay | Admitting: Physician Assistant

## 2013-03-03 NOTE — Telephone Encounter (Signed)
Rx was sent to pharmacy electronically. 

## 2013-03-07 ENCOUNTER — Encounter: Payer: Self-pay | Admitting: Cardiology

## 2013-03-07 ENCOUNTER — Ambulatory Visit (INDEPENDENT_AMBULATORY_CARE_PROVIDER_SITE_OTHER): Payer: Medicare HMO | Admitting: Cardiology

## 2013-03-07 ENCOUNTER — Ambulatory Visit (INDEPENDENT_AMBULATORY_CARE_PROVIDER_SITE_OTHER): Payer: Medicare HMO | Admitting: Pharmacist Clinician (PhC)/ Clinical Pharmacy Specialist

## 2013-03-07 VITALS — BP 140/70 | HR 74 | Ht 65.0 in | Wt 258.5 lb

## 2013-03-07 DIAGNOSIS — E785 Hyperlipidemia, unspecified: Secondary | ICD-10-CM

## 2013-03-07 DIAGNOSIS — I6350 Cerebral infarction due to unspecified occlusion or stenosis of unspecified cerebral artery: Secondary | ICD-10-CM

## 2013-03-07 DIAGNOSIS — I6340 Cerebral infarction due to embolism of unspecified cerebral artery: Secondary | ICD-10-CM

## 2013-03-07 DIAGNOSIS — I4891 Unspecified atrial fibrillation: Secondary | ICD-10-CM

## 2013-03-07 DIAGNOSIS — Z7901 Long term (current) use of anticoagulants: Secondary | ICD-10-CM

## 2013-03-07 DIAGNOSIS — I639 Cerebral infarction, unspecified: Secondary | ICD-10-CM

## 2013-03-07 DIAGNOSIS — I480 Paroxysmal atrial fibrillation: Secondary | ICD-10-CM

## 2013-03-07 DIAGNOSIS — I2510 Atherosclerotic heart disease of native coronary artery without angina pectoris: Secondary | ICD-10-CM

## 2013-03-07 DIAGNOSIS — I10 Essential (primary) hypertension: Secondary | ICD-10-CM

## 2013-03-07 LAB — POCT INR: INR: 2.9

## 2013-03-07 NOTE — Patient Instructions (Signed)
No change with with medication   Your physician wants you to follow-up in 6 month with an extender and 12 month Dr Ellyn Hack. .  You will receive a reminder letter in the mail two months in advance. If you don't receive a letter, please call our office to schedule the follow-up appointment.

## 2013-03-09 ENCOUNTER — Encounter: Payer: Self-pay | Admitting: Cardiology

## 2013-03-09 DIAGNOSIS — I480 Paroxysmal atrial fibrillation: Secondary | ICD-10-CM | POA: Insufficient documentation

## 2013-03-09 NOTE — Assessment & Plan Note (Signed)
He definitely needs to work on dietary modification. He isn't really motivated to do much the way of activity. He had his significant other and lives daughter accompanied her today, and we had a good talk about the importance of him becoming more active. He seemed to give this lip service but nothing more than that.

## 2013-03-09 NOTE — Progress Notes (Signed)
PATIENT: Terry Robertson MRN: [id number]  DOB: September 14, 1928   DOV:03/09/2013 PCP: [name], MD  Clinic Note: Chief Complaint  Patient presents with  . ROV 6 months    C/o occas leg pain, otherwise no complaints.    HPI: Terry Robertson is a 80 y.o.  male with a PMH below who presents today for  six-month followup CAD PAF with a stroke as well as obesity, hypertension dyslipidemia.  Interval History: He presents today doing relatively well. He is essentially fully recovered from his stroke, only noticing occasionally have some mild word recognition problems and stuttering speech. But otherwise no recurrent symptoms of lightheadedness, dizziness, syncope or near CVA or TIA RCA stent symptoms. No chest pressure with pressors exertion. No dyspnea at rest or exertion. He really doesn't do much besides walk around the house. He does not do any exercise. He continued eating like he used to but does not do as much exercise as he used to.  No rapid or irregular heartbeats or palpitations to suggest recurrence of A. fib. No headaches or blurred vision. No melena, hematochezia or or gross hematuria. He does see urology for microscopic hematuria. Has occasional cramping in the legs at night but no claudication. Otherwise no real myalgias.  Past Medical History  Diagnosis Date  . Diabetes mellitus   . Hypertension   . Stroke 08/2011, 03/2012    L MCA (Afib RVR while admitted for hypotension) - expressive aphasia (almost totally recovered)  . Seizures     Post CVA; on Keppra  . OSA on CPAP   . Paroxysmal atrial fibrillation     on coumadin; Diagnosed at the time of his 1st CVA  . Obesity, morbid     BMI  . Heart attack 1997    inferior MI with left circumflex stent  . CAD S/P percutaneous coronary angioplasty 1997    status post remote PCI to the LAD and circumflex in 1997, heart cath in 2008 showed widely patent stents in the circumflex and LAD.  RCA had a mid lesion and then was totally occluded  distally.  . Myelodysplastic syndrome     w/mild anemia an neutropenia  . BPH (benign prostatic hyperplasia)     TURP in 12/2010    Prior Cardiac Evaluation and Past Surgical History: Past Surgical History  Procedure Laterality Date  . Transthoracic echocardiogram  08/06/11    EF 55% to 65% with grade2 diastolic dysfunction/pseudonormal with mildly dilated left atrium, had evidence of elevated filling ressures in the left vientricle and left atrium  . Cardiac catheterization  02/08/07    widely patent stent in the circumflex and LAD.  RCA had a mid lesion and then was totally occluded distally. Cook bare-metal 3.5x20 mm stent  . Transurethral resection of prostate  12/2010  . Nm myoview ltd  01/09/1999    Inferior wall thinning with possible mild ischemia versus infarct. This would correlate well with his known RCA occlusion    No Known Allergies  Current Outpatient Prescriptions  Medication Sig Dispense Refill  . aspirin EC 81 MG tablet Take 81 mg by mouth daily.      Marland Kitchen atenolol (TENORMIN) 25 MG tablet TAKE 1/2 TABLET BY MOUTH EVERY DAY  15 tablet  5  . diltiazem (DILACOR XR) 180 MG 24 hr capsule TAKE 1 CAPSULE BY MOUTH EVERY DAY  30 capsule  1  . fish oil-omega-3 fatty acids 1000 MG capsule Take 1 g by mouth daily.      Marland Kitchen  FLUVIRIN INJ injection       . furosemide (LASIX) 20 MG tablet Take 1 tablet (20 mg total) by mouth daily with breakfast.  30 tablet  1  . HYDROcodone-acetaminophen (NORCO/VICODIN) 5-325 MG per tablet Take 1 tablet by mouth as needed for pain.      Vanessa Kick Ethyl (VASCEPA) 1 G CAPS Take 1 g by mouth daily.      Marland Kitchen levETIRAcetam (KEPPRA) 500 MG tablet Take 1 tablet (500 mg total) by mouth every 12 (twelve) hours.  60 tablet  6  . nitroGLYCERIN (NITROSTAT) 0.4 MG SL tablet Place 0.4 mg under the tongue every 5 (five) minutes as needed for chest pain.      . simvastatin (ZOCOR) 20 MG tablet Take 1 tablet (20 mg total) by mouth daily.  30 tablet  0  . terazosin  (HYTRIN) 2 MG capsule Take 2 mg by mouth at bedtime.      Marland Kitchen warfarin (COUMADIN) 5 MG tablet Take 1 & 1/2 to 2 tablets by mouth daily as directed  50 tablet  5  . Vitamin D, Ergocalciferol, (DRISDOL) 50000 UNITS CAPS Take 50,000 Units by mouth 2 (two) times a week. Takes 1 capsule on Wednesday and Saturday every week       No current facility-administered medications for this visit.    History   Social History Narrative   Pt is widowed father of 29, 48 grandchildren, 32 great-grandchildren and 3 great-great-grandchildren.  He does not get routine exercise.   Patient has a high school education.   Patient is right-handed.   Patient drinks one cup of coffee daily.    ROS: A comprehensive Review of Systems - Negative except Mostly just lethargy, lack of desire to exercise or during activity. He truly has arthralgias and myalgias from arthritis. He has gained a significant amount of weight.  PHYSICAL EXAM BP 140/70  Pulse 74  Ht 5\' 5"  (5.852 m)  Wt 258 lb 8 oz (117.255 kg)  BMI 43.02 kg/m2 General: Pleasant gentleman. Appears younger than his stated age. He is in no acute distress. He is alert and oriented x3. He answers questions appropriately but is a bit slow with answering some questions.Marland Kitchen He is well groomed and well nourished. H HEENT: NCAT. EOMI. MMM. Speech is not so slurred, but remains a bit slow. Anicteric sclerae.  Neck: Supple with no LAN, JVD, or carotid bruit.  Lungs: CTAB. Nonlabored. Normal effort. Good air movement.  Heart: RRR, Distant S1, S2. Occasional ectopy but soft SEM heard at the right upper sternal border. Abdomen: Soft/NT/ND/NABS. No HSM. Difficult to palpate due to obesity.  Extremities: No C/C/E. Pedal pulses are 1+ bilaterally and upper extremities are 2+ bilaterally.   DPO:EUMPNTIRW today: Yes Rate:74 , Rhythm: NSR, PVC; Otherwise normal EKG    Recent Labs: None  ASSESSMENT / PLAN: CAD (coronary artery disease) PCI to circumflex in 1997.  100% occluded  RCA No active cardiac symptoms. And he does measure widely patent LAD stent. The mild amount of ischemia noted on stress test correlated well with the RCA occlusion. No anginal symptoms or heart failure symptoms. Is on aspirin, statin and diltiazem which is more for rate control. He is also on low-dose beta blocker.   Unfortunately, he is not active enough to really know if he would have exertional angina with moderate exertion. He did talk to him the importance of trying to get some exercise.   PAF (paroxysmal atrial fibrillation) As far as I can tell, he  does not have any recurrences of A. fib. He was mildly symptomatic in the hospital, which would make me believe that he probably does have some episodes are not seen. He is rate controlled on the calcium channel blocker and beta blocker, and anticoagulated on warfarin. No severe bleeding issues just microscopic hematuria.  Acute stroke - possibly  cardioembolic ([id number]) He seems to have fully recovered from his stroke after rehabilitation. He remains on aspirin plus warfarin.  HTN (hypertension), controlled Blood pressure is relatively well controlled. I think we are fine for this to be target range in the 140s. I would be reluctant to have him be much lower since he was hospitalized for hypotension at the time of developing A. fib RVR.  Long term (current) use of anticoagulants INR followed here at [facility] office.  Dyslipidemia Relatively stable on current medications. This is being followed by his PCP.  Severe obesity (BMI >= 40)  He definitely needs to work on dietary modification. He isn't really motivated to do much the way of activity. He had his significant other and lives daughter accompanied her today, and we had a good talk about the importance of him becoming more active. He seemed to give this lip service but nothing more than that.    No orders of the defined types were placed in this encounter.   No orders of the  defined types were placed in this encounter.    Followup: 6 month with APP; 12 month with Dr. Leonie Man. Ellyn Hack, M.D., M.S. [facility] [address] [place], [place]  [address]  [phone] Pager # [phone]

## 2013-03-09 NOTE — Assessment & Plan Note (Signed)
Blood pressure is relatively well controlled. I think we are fine for this to be target range in the 140s. I would be reluctant to have him be much lower since he was hospitalized for hypotension at the time of developing A. fib RVR.

## 2013-03-09 NOTE — Assessment & Plan Note (Signed)
Relatively stable on current medications. This is being followed by his PCP.

## 2013-03-09 NOTE — Assessment & Plan Note (Signed)
INR followed here at [facility] office 

## 2013-03-09 NOTE — Assessment & Plan Note (Signed)
As far as I can tell, he does not have any recurrences of A. fib. He was mildly symptomatic in the hospital, which would make me believe that he probably does have some episodes are not seen. He is rate controlled on the calcium channel blocker and beta blocker, and anticoagulated on warfarin. No severe bleeding issues just microscopic hematuria.

## 2013-03-09 NOTE — Assessment & Plan Note (Signed)
He seems to have fully recovered from his stroke after rehabilitation. He remains on aspirin plus warfarin.

## 2013-03-09 NOTE — Assessment & Plan Note (Signed)
No active cardiac symptoms. And he does measure widely patent LAD stent. The mild amount of ischemia noted on stress test correlated well with the RCA occlusion. No anginal symptoms or heart failure symptoms. Is on aspirin, statin and diltiazem which is more for rate control. He is also on low-dose beta blocker.   Unfortunately, he is not active enough to really know if he would have exertional angina with moderate exertion. He did talk to him the importance of trying to get some exercise.

## 2013-04-04 ENCOUNTER — Ambulatory Visit: Payer: Medicare Other | Admitting: Pharmacist Clinician (PhC)/ Clinical Pharmacy Specialist

## 2013-04-07 ENCOUNTER — Ambulatory Visit (INDEPENDENT_AMBULATORY_CARE_PROVIDER_SITE_OTHER): Payer: Medicare HMO | Admitting: Pharmacist Clinician (PhC)/ Clinical Pharmacy Specialist

## 2013-04-07 VITALS — BP 144/84 | HR 84

## 2013-04-07 DIAGNOSIS — I4891 Unspecified atrial fibrillation: Secondary | ICD-10-CM

## 2013-04-07 DIAGNOSIS — I6340 Cerebral infarction due to embolism of unspecified cerebral artery: Secondary | ICD-10-CM

## 2013-04-07 DIAGNOSIS — I639 Cerebral infarction, unspecified: Secondary | ICD-10-CM

## 2013-04-07 DIAGNOSIS — Z7901 Long term (current) use of anticoagulants: Secondary | ICD-10-CM

## 2013-04-07 LAB — POCT INR: INR: 3.7

## 2013-04-21 ENCOUNTER — Ambulatory Visit: Payer: Medicare HMO | Admitting: Pharmacist Clinician (PhC)/ Clinical Pharmacy Specialist

## 2013-04-25 ENCOUNTER — Ambulatory Visit (INDEPENDENT_AMBULATORY_CARE_PROVIDER_SITE_OTHER): Payer: Medicare HMO | Admitting: Pharmacist Clinician (PhC)/ Clinical Pharmacy Specialist

## 2013-04-25 VITALS — BP 164/78 | HR 80

## 2013-04-25 DIAGNOSIS — I4891 Unspecified atrial fibrillation: Secondary | ICD-10-CM

## 2013-04-25 DIAGNOSIS — Z7901 Long term (current) use of anticoagulants: Secondary | ICD-10-CM

## 2013-04-25 DIAGNOSIS — I6340 Cerebral infarction due to embolism of unspecified cerebral artery: Secondary | ICD-10-CM

## 2013-04-25 DIAGNOSIS — I639 Cerebral infarction, unspecified: Secondary | ICD-10-CM

## 2013-04-25 LAB — POCT INR: INR: 1.9

## 2013-04-30 ENCOUNTER — Telehealth: Payer: Self-pay | Admitting: Internal Medicine

## 2013-04-30 NOTE — Telephone Encounter (Signed)
C/D 04/30/13 for appt. 05/08/13

## 2013-04-30 NOTE — Telephone Encounter (Signed)
S/W [facility] PATIENT FRIEND AND GAVE APPT FOR 03/05 @ 1:30 W/DR. CHISM.  REFERRING DR. Sedro-Woolley PACKET MAILED

## 2013-05-02 ENCOUNTER — Other Ambulatory Visit: Payer: Self-pay | Admitting: Cardiology

## 2013-05-02 NOTE — Telephone Encounter (Signed)
Rx was sent to pharmacy electronically. 

## 2013-05-05 ENCOUNTER — Encounter: Payer: Self-pay | Admitting: Podiatry

## 2013-05-05 ENCOUNTER — Ambulatory Visit (INDEPENDENT_AMBULATORY_CARE_PROVIDER_SITE_OTHER): Payer: Medicare HMO | Admitting: Podiatry

## 2013-05-05 VITALS — BP 120/78 | HR 78 | Resp 16

## 2013-05-05 DIAGNOSIS — M79609 Pain in unspecified limb: Secondary | ICD-10-CM

## 2013-05-05 DIAGNOSIS — B351 Tinea unguium: Secondary | ICD-10-CM

## 2013-05-05 NOTE — Patient Instructions (Signed)
Apply Cocoa butter into the bottom of the feet daily to control scaling skin.  Diabetes and Foot Care Diabetes may cause you to have problems because of poor blood supply (circulation) to your feet and legs. This may cause the skin on your feet to become thinner, break easier, and heal more slowly. Your skin may become dry, and the skin may peel and crack. You may also have nerve damage in your legs and feet causing decreased feeling in them. You may not notice minor injuries to your feet that could lead to infections or more serious problems. Taking care of your feet is one of the most important things you can do for yourself.  HOME CARE INSTRUCTIONS  Wear shoes at all times, even in the house. Do not go barefoot. Bare feet are easily injured.  Check your feet daily for blisters, cuts, and redness. If you cannot see the bottom of your feet, use a mirror or ask someone for help.  Wash your feet with warm water (do not use hot water) and mild soap. Then pat your feet and the areas between your toes until they are completely dry. Do not soak your feet as this can dry your skin.  Apply a moisturizing lotion or petroleum jelly (that does not contain alcohol and is unscented) to the skin on your feet and to dry, brittle toenails. Do not apply lotion between your toes.  Trim your toenails straight across. Do not dig under them or around the cuticle. File the edges of your nails with an emery board or nail file.  Do not cut corns or calluses or try to remove them with medicine.  Wear clean socks or stockings every day. Make sure they are not too tight. Do not wear knee-high stockings since they may decrease blood flow to your legs.  Wear shoes that fit properly and have enough cushioning. To break in new shoes, wear them for just a few hours a day. This prevents you from injuring your feet. Always look in your shoes before you put them on to be sure there are no objects inside.  Do not cross your  legs. This may decrease the blood flow to your feet.  If you find a minor scrape, cut, or break in the skin on your feet, keep it and the skin around it clean and dry. These areas may be cleansed with mild soap and water. Do not cleanse the area with peroxide, alcohol, or iodine.  When you remove an adhesive bandage, be sure not to damage the skin around it.  If you have a wound, look at it several times a day to make sure it is healing.  Do not use heating pads or hot water bottles. They may burn your skin. If you have lost feeling in your feet or legs, you may not know it is happening until it is too late.  Make sure your health care provider performs a complete foot exam at least annually or more often if you have foot problems. Report any cuts, sores, or bruises to your health care provider immediately. SEEK MEDICAL CARE IF:   You have an injury that is not healing.  You have cuts or breaks in the skin.  You have an ingrown nail.  You notice redness on your legs or feet.  You feel burning or tingling in your legs or feet.  You have pain or cramps in your legs and feet.  Your legs or feet are numb.  Your feet always feel cold. SEEK IMMEDIATE MEDICAL CARE IF:   There is increasing redness, swelling, or pain in or around a wound.  There is a red line that goes up your leg.  Pus is coming from a wound.  You develop a fever or as directed by your health care provider.  You notice a bad smell coming from an ulcer or wound. Document Released: 02/18/2000 Document Revised: 10/23/2012 Document Reviewed: 07/30/2012 [facility] Patient Information 2014 [place].

## 2013-05-06 NOTE — Progress Notes (Signed)
Patient ID: Terry Robertson, male   DOB: 20-Nov-1928, 80 y.o.   MRN: [id number]  Subjective: This patient presents complaining of painful toenails. The last visit for this service was on 02/12/2013.  Objective: Elongated, discolored, hypertrophic toenails with palpable tenderness in all nail plates. Dry scaling skin bilaterally  Assessment: Symptomatic onychomycoses x10  Dry skin bilaterally  Plan: Nails x10 are debrided back without a bleeding reappoint at three-month intervals. Advised to apply cocoa butter to feet daily. After visit summary provided with diabetic foot information

## 2013-05-08 ENCOUNTER — Telehealth: Payer: Self-pay | Admitting: Internal Medicine

## 2013-05-08 ENCOUNTER — Ambulatory Visit: Payer: Medicare HMO

## 2013-05-08 NOTE — Telephone Encounter (Signed)
pt called to r/s missed appt...done...pt aware of new d.t of appt

## 2013-05-14 ENCOUNTER — Ambulatory Visit (HOSPITAL_BASED_OUTPATIENT_CLINIC_OR_DEPARTMENT_OTHER): Payer: Medicare HMO | Admitting: Internal Medicine

## 2013-05-14 VITALS — BP 167/75 | HR 90 | Temp 97.4°F | Resp 20 | Ht 65.0 in | Wt 247.3 lb

## 2013-05-14 DIAGNOSIS — D469 Myelodysplastic syndrome, unspecified: Secondary | ICD-10-CM

## 2013-05-14 DIAGNOSIS — D696 Thrombocytopenia, unspecified: Secondary | ICD-10-CM

## 2013-05-14 NOTE — Patient Instructions (Signed)
Myelodysplastic Syndrome Myelodysplastic syndrome (MDS) is a group of blood diseases that affects new blood cells. This syndrome starts in the bone marrow where new blood cells are made. New blood cells are called immature cells or stem cells. With MDS, some immature cells do not grow into adult blood cells. Immature blood cells cannot live for very long in the body and they die. Over time, immature blood cells crowd out normal adult blood cells. This causes a low blood count. The bone marrow produces red blood cells which carry oxygen, white blood cells which fight infection, and platelets which help stop bleeding. If you have too few red blood cells, your blood may not have enough oxygen, and you will feel tired. If you do not have enough white blood cells, you might get infections often. If youhave too few platelets, you could bruise or bleed easily. CAUSES Sometimes, the cause is unknown. Known causes include:  Being exposed to certain drugs or chemicals.   Radiation treatment. This treatment is often given to fight cancer. Some things make it more likely that MDS will develop. These are called risk factors. They include:   Being white (Caucasian).   Being male.   Being older than 60 years.   Having been treated with cancer drugs or radiation.   Having been exposed to tobacco smoke, pesticides, lead, mercury, or the benzene chemical.  SYMPTOMS As MDS develops, the body reaches a point where it does not have enough adult blood cells. That is when symptoms usually start. Symptoms often develop slowly over time. The type of symptoms people have depends on which blood cells are affected. Symptoms may include:   Being tired all the time.   Feeling out of breath.   Having pale skin.   Getting infections often.   Having a fever often.   Bruising easily.   Bleeding that lasts longer than normal.   Having tiny spots of bleeding under the skin (petechiae). DIAGNOSIS A  physical exam is performed. Blood tests that may be performed include:  A complete blood count. This test counts the number of red, white, and platelet blood cells.   Peripheral blood smear. This test checks the number and type of blood cells. It also checks their size and shape.   Bone marrow testing. A small piece of bone marrow is examined under a microscope to look for MDS.  TREATMENT There is no cure for MDS. However, treatment is directed at relieving symptoms. Treatment can also slow down the making of immature blood cells. The type of treatment that is best for you depends on the type of MDS you have. Common treatments include:   Blood transfusions.  Medicines that:  Slow down the number of immature blood cells that are made.   Help immature cells develop into adult cells.   Make more blood cells.   Treat infections (antibiotics).  Bone marrow stem cell transplant. Your stem cells would be replaced with healthy stem cells from a donor.  HOME CARE INSTRUCTIONS  Only take medicine as prescribed by your caregiver. Follow the directions carefully. Do not start a new medicine unless your caregiver says it is okay.   Ask your caregiver if it is okay to take aspirin. Aspirin can make bleeding more likely.   Avoid dangerous activities to prevent bleeding. Ask your caregiver what activities are safe for you.   Check with your caregiver before you have any minor surgery or dental procedure.   Wash all fruits and vegetables before   eating them. Make sure meat and eggs are well-cooked. Do not eat raw fish and shellfish.   Wash your hands often. You can also use hand sanitizer. Stay away from people who are sick.   Keep all your follow-up appointments. SEEK MEDICAL CARE IF:  You feel more tired than normal.   You think you might have an infection.   You have more bruising than usual.   You have trouble catching your breath.  SEEK IMMEDIATE MEDICAL CARE IF:     You have an infection that is getting worse.   You have bleeding that you cannot stop.   You have trouble breathing.   You have chest pain.   You have a fever. Document Released: 08/22/2011 Document Reviewed: 08/22/2011 [facility] Patient Information 2014 [place], [place]. Anemia, Nonspecific Anemia is a condition in which the concentration of red blood cells or hemoglobin in the blood is below normal. Hemoglobin is a substance in red blood cells that carries oxygen to the tissues of the body. Anemia results in not enough oxygen reaching these tissues.  CAUSES  Common causes of anemia include:   Excessive bleeding. Bleeding may be internal or external. This includes excessive bleeding from periods (in women) or from the intestine.   Poor nutrition.   Chronic kidney, thyroid, and liver disease.  Bone marrow disorders that decrease red blood cell production.  Cancer and treatments for cancer.  HIV, AIDS, and their treatments.  Spleen problems that increase red blood cell destruction.  Blood disorders.  Excess destruction of red blood cells due to infection, medicines, and autoimmune disorders. SIGNS AND SYMPTOMS   Minor weakness.   Dizziness.   Headache.  Palpitations.   Shortness of breath, especially with exercise.   Paleness.  Cold sensitivity.  Indigestion.  Nausea.  Difficulty sleeping.  Difficulty concentrating. Symptoms may occur suddenly or they may develop slowly.  DIAGNOSIS  Additional blood tests are often needed. These help your health care provider determine the best treatment. Your health care provider will check your stool for blood and look for other causes of blood loss.  TREATMENT  Treatment varies depending on the cause of the anemia. Treatment can include:   Supplements of iron, vitamin X38, or folic acid.   Hormone medicines.   A blood transfusion. This may be needed if blood loss is severe.   Hospitalization. This  may be needed if there is significant continual blood loss.   Dietary changes.  Spleen removal. HOME CARE INSTRUCTIONS Keep all follow-up appointments. It often takes many weeks to correct anemia, and having your health care provider check on your condition and your response to treatment is very important. SEEK IMMEDIATE MEDICAL CARE IF:   You develop extreme weakness, shortness of breath, or chest pain.   You become dizzy or have trouble concentrating.  You develop heavy vaginal bleeding.   You develop a rash.   You have bloody or black, tarry stools.   You faint.   You vomit up blood.   You vomit repeatedly.   You have abdominal pain.  You have a fever or persistent symptoms for more than 2 3 days.   You have a fever and your symptoms suddenly get worse.   You are dehydrated.  MAKE SURE YOU:  Understand these instructions.  Will watch your condition.  Will get help right away if you are not doing well or get worse. Document Released: 03/30/2004 Document Revised: 10/23/2012 Document Reviewed: 08/16/2012 [facility] Patient Information 2014 [place].  Thrombocytopenia  Thrombocytopenia is a condition in which there is an abnormally small number of platelets in your blood. Platelets are also called thrombocytes. Platelets are needed for blood clotting. CAUSES Thrombocytopenia is caused by:   Decreased production of platelets. This can be caused by:  Aplastic anemia in which your bone marrow quits making blood cells.  Cancer in the bone marrow.  Use of certain medicines, including chemotherapy.  Infection in the bone marrow.  Heavy alcohol consumption.  Increased destruction of platelets. This can be caused by:  Certain immune diseases.  Use of certain drugs.  Certain blood clotting disorders.  Certain inherited disorders.  Certain bleeding disorders.  Pregnancy.  Having an enlarged spleen (hypersplenism). In hypersplenism, the  spleen gathers up platelets from circulation. This means the platelets are not available to help with blood clotting. The spleen can enlarge due to cirrhosis or other conditions. SYMPTOMS  The symptoms of thrombocytopenia are side effects of poor blood clotting. Some of these are:  Abnormal bleeding.  Nosebleeds.  Heavy menstrual periods.  Blood in the urine or stools.  Purpura. This is a purplish discoloration in the skin produced by small bleeding vessels near the surface of the skin.  Bruising.  A rash that may be petechial. This looks like pinpoint, purplish-red spots on the skin and mucous membranes. It is caused by bleeding from small blood vessels (capillaries). DIAGNOSIS  Your caregiver will make this diagnosis based on your exam and blood tests. Sometimes, a bone marrow study is done to look for the original cells (megakaryocytes) that make platelets. TREATMENT  Treatment depends on the cause of the condition.  Medicines may be given to help protect your platelets from being destroyed.  In some cases, a replacement (transfusion) of platelets may be required to stop or prevent bleeding.  Sometimes, the spleen must be surgically removed. HOME CARE INSTRUCTIONS   Check the skin and linings inside your mouth for bruising or bleeding as directed by your caregiver.  Check your sputum, urine, and stool for blood as directed by your caregiver.  Do not return to any activities that could cause bumps or bruises until your caregiver says it is okay.  Take extra care not to cut yourself when shaving or when using scissors, needles, knives, and other tools.  Take extra care not to burn yourself when ironing or cooking.  Ask your caregiver if it is okay for you to drink alcohol.  Only take over-the-counter or prescription medicines as directed by your caregiver.  Notify all your caregivers, including dentists and eye doctors, about your condition. SEEK IMMEDIATE MEDICAL CARE  IF:   You develop active bleeding from anywhere in your body.  You develop unexplained bruising or bleeding.  You have blood in your sputum, urine, or stool. MAKE SURE YOU:  Understand these instructions.  Will watch your condition.  Will get help right away if you are not doing well or get worse. Document Released: 02/20/2005 Document Revised: 05/15/2011 Document Reviewed: 12/23/2010 [facility] Patient Information 2014 [place], [place].

## 2013-05-15 ENCOUNTER — Telehealth: Payer: Self-pay | Admitting: Internal Medicine

## 2013-05-15 NOTE — Telephone Encounter (Signed)
lvm for pt regarding to June appt....mailed pt appt sched/avs and letter °

## 2013-05-16 ENCOUNTER — Ambulatory Visit (INDEPENDENT_AMBULATORY_CARE_PROVIDER_SITE_OTHER): Payer: Medicare HMO | Admitting: Pharmacist Clinician (PhC)/ Clinical Pharmacy Specialist

## 2013-05-16 DIAGNOSIS — I639 Cerebral infarction, unspecified: Secondary | ICD-10-CM

## 2013-05-16 DIAGNOSIS — Z7901 Long term (current) use of anticoagulants: Secondary | ICD-10-CM

## 2013-05-16 DIAGNOSIS — I6340 Cerebral infarction due to embolism of unspecified cerebral artery: Secondary | ICD-10-CM

## 2013-05-16 DIAGNOSIS — I4891 Unspecified atrial fibrillation: Secondary | ICD-10-CM

## 2013-05-16 LAB — POCT INR: INR: 2.4

## 2013-05-17 NOTE — Progress Notes (Signed)
[facility] OFFICE PROGRESS NOTE  Terry Greenland, MD [address] [place] [address]  DIAGNOSIS: Myelodysplastic syndrome - Plan: CBC with Differential, Comprehensive metabolic panel (Cmet) - [facility], Lactate dehydrogenase (LDH) - [facility]  Thrombocytopenia, chronic - Plan: CBC with Differential, Comprehensive metabolic panel (Cmet) - [facility], Lactate dehydrogenase (LDH) - [facility]  Chief Complaint  Patient presents with  . Myelodysplastic syndrome    CURRENT TREATMENT: Observation.   INTERVAL HISTORY: Terry Robertson 80 y.o. male with a history of MDS is here for follow-up visit.  He was last seen by Dr. Zola Robertson on 01/02/2012.   He is a gentleman who has been following since October 2009, with an isolated neutropenia. Bone marrow biopsy showed an element of both myeloproliferative as well as myelodysplastic syndrome, but overall I felt that the features are consistent with myelodysplastic syndrome. I have elected to treatment him with supportive care only for the time being. He has been completely asymptomatic. He has not reported any recurrent sign of pulmonary infection. He is not having increased blast. His cytogenetics are normal. He denies any recent emergency room visits or hospitalizations.   He has received one Neulasta injection in the past and has helped his blood count dramatically and it is good to know that he has a good response to it down the line. Otherwise, Terry Robertson does not report any recurrent sign of pulmonary injection. He has not reported any peripheral neuropathy. He has not reported any other complaints.   MEDICAL HISTORY: Past Medical History  Diagnosis Date  . Diabetes mellitus   . Hypertension   . Stroke 08/2011, 03/2012    L MCA (Afib RVR while admitted for hypotension) - expressive aphasia (almost totally recovered)  . Seizures     Post CVA; on Keppra  . OSA on CPAP   . Paroxysmal atrial fibrillation     on coumadin; Diagnosed at the  time of his 1st CVA  . Obesity, morbid     BMI  . Heart attack 1997    inferior MI with left circumflex stent  . CAD S/P percutaneous coronary angioplasty 1997    status post remote PCI to the LAD and circumflex in 1997, heart cath in 2008 showed widely patent stents in the circumflex and LAD.  RCA had a mid lesion and then was totally occluded distally.  . Myelodysplastic syndrome     w/mild anemia an neutropenia  . BPH (benign prostatic hyperplasia)     TURP in 12/2010    INTERIM HISTORY: has Hypotension - resolved; due to poor PO intake with continued Antihypertensive therapy; CAD (coronary artery disease) PCI to circumflex in 1997.  100% occluded RCA; Morbid obesity; Myelodysplastic syndrome; Dyslipidemia; HTN (hypertension), controlled; S/P TURP Oct 2012; DM type 2 (diabetes mellitus, type 2); Diastolic dysfunction: Grade 2; Thrombocytopenia, chronic; Acute stroke - possibly  cardioembolic (2/0/8022); Hx of AF, NSR presently; Acute respiratory failure, resolved; Altered mental status, suspect post ictal; Seizure, resolved; Fever, unclear etiology; Pulmonary edema; CVA (cerebral infarction); Long term (current) use of anticoagulants; Other convulsions; Severe obesity (BMI >= 40); and PAF (paroxysmal atrial fibrillation) on his problem list.    ALLERGIES:  has No Known Allergies.  MEDICATIONS: has a current medication list which includes the following prescription(s): aspirin ec, atenolol, diltiazem, fish oil-omega-3 fatty acids, furosemide, hydrocodone-acetaminophen, icosapent ethyl, levetiracetam, nitroglycerin, simvastatin, terazosin, vitamin d (ergocalciferol), warfarin, and fluvirin.  SURGICAL HISTORY:  Past Surgical History  Procedure Laterality Date  . Transthoracic echocardiogram  08/06/11  EF 55% to 65% with grade2 diastolic dysfunction/pseudonormal with mildly dilated left atrium, had evidence of elevated filling ressures in the left vientricle and left atrium  . Cardiac  catheterization  02/08/07    widely patent stent in the circumflex and LAD.  RCA had a mid lesion and then was totally occluded distally. Cook bare-metal 3.5x20 mm stent  . Transurethral resection of prostate  12/2010  . Nm myoview ltd  01/09/1999    Inferior wall thinning with possible mild ischemia versus infarct. This would correlate well with his known RCA occlusion    REVIEW OF SYSTEMS:   Constitutional: Denies fevers, chills or abnormal weight loss Eyes: Denies blurriness of vision Ears, nose, mouth, throat, and face: Denies mucositis or sore throat Respiratory: Denies cough, dyspnea or wheezes Cardiovascular: Denies palpitation, chest discomfort or lower extremity swelling Gastrointestinal:  Denies nausea, heartburn or change in bowel habits Skin: Denies abnormal skin rashes Lymphatics: Denies new lymphadenopathy or easy bruising Neurological:Denies numbness, tingling or new weaknesses Behavioral/Psych: Mood is stable, no new changes  All other systems were reviewed with the patient and are negative.  PHYSICAL EXAMINATION: ECOG PERFORMANCE STATUS: 1 - Symptomatic but completely ambulatory  Blood pressure 167/75, pulse 90, temperature 97.4 F (36.3 C), temperature source Oral, resp. rate 20, height '5\' 5"'  (1.651 m), weight 247 lb 4.8 oz (112.175 kg), SpO2 100.00%.  GENERAL:alert, no distress and comfortable; ambulates with a cane SKIN: skin color, texture, turgor are normal, no rashes or significant lesions EYES: normal, Conjunctiva are pink and non-injected, sclera clear OROPHARYNX:no exudate, no erythema and lips, buccal mucosa, and tongue normal  NECK: supple, thyroid normal size, non-tender, without nodularity LYMPH:  no palpable lymphadenopathy in the cervical, axillary or supraclavicular LUNGS: clear to auscultation with normal breathing effort, no wheezes or rhonchi HEART: regular rate & rhythm and no murmurs and no lower extremity edema ABDOMEN:abdomen soft, non-tender  and normal bowel sounds Musculoskeletal:no cyanosis of digits and no clubbing  NEURO: alert & oriented x 3 with fluent speech, no focal motor/sensory deficits  Labs:  Lab Results  Component Value Date   WBC 4.3 03/28/2012   HGB 10.9* 03/28/2012   HCT 34.3* 03/28/2012   MCV 83.1 03/28/2012   PLT 186 03/28/2012   NEUTROABS 1.5* 03/28/2012      Chemistry      Component Value Date/Time   NA 140 03/28/2012 0615   NA 139 01/02/2012 1526   K 4.6 03/28/2012 0615   K 3.8 01/02/2012 1526   CL 102 03/28/2012 0615   CL 109* 01/02/2012 1526   CO2 27 03/28/2012 0615   CO2 26 01/02/2012 1526   BUN 13 03/28/2012 0615   BUN 12.0 01/02/2012 1526   CREATININE 0.84 03/28/2012 0615   CREATININE 1.0 01/02/2012 1526      Component Value Date/Time   CALCIUM 9.5 03/28/2012 0615   CALCIUM 9.2 01/02/2012 1526   ALKPHOS 89 03/28/2012 0615   ALKPHOS 52 01/02/2012 1526   AST 44* 03/28/2012 0615   AST 14 01/02/2012 1526   ALT 57* 03/28/2012 0615   ALT 8 01/02/2012 1526   BILITOT 0.3 03/28/2012 0615   BILITOT 0.40 01/02/2012 1526       GFR Estimated Creatinine Clearance: 78.4 ml/min (by C-G formula based on Cr of 0.84). Coagulation profile  Recent Labs Lab 05/16/13 1339  INR 2.4    Studies:  No results found.   RADIOGRAPHIC STUDIES: No results found.  ASSESSMENT: Terry Robertson 80 y.o. male with a history of  Myelodysplastic syndrome - Plan: CBC with Differential, Comprehensive metabolic panel (Cmet) - [facility], Lactate dehydrogenase (LDH) - [facility]  Thrombocytopenia, chronic - Plan: CBC with Differential, Comprehensive metabolic panel (Cmet) - [facility], Lactate dehydrogenase (LDH) - [facility]   PLAN:   1. Myelodysplastic/myeloproliferative disorder, JAK2 mutation is negative.  --His cytogenetics normal, but for the time being it is more likely myelodysplastic syndrome more than anything else. We will continue supportive measures at this time. He does not need any growth factor support unless he has a  symptomatology such as recurrent infection etc. We will continue to follow him every 6 months.   2. Cytopenias:  --No need for any transfusions or growth factor support at this time  3. Follow-up. --He will follow up in 3 months for repeat labs including CBC, chemistries and LDH.   All questions were answered. The patient knows to call the clinic with any problems, questions or concerns. We can certainly see the patient much sooner if necessary.  I spent 15 minutes counseling the patient face to face. The total time spent in the appointment was 25 minutes.    Terry Runions, MD 05/17/2013 10:06 AM

## 2013-06-03 ENCOUNTER — Emergency Department (HOSPITAL_COMMUNITY)
Admission: EM | Admit: 2013-06-03 | Discharge: 2013-06-03 | Discharge status: Against Medical Advice | Payer: Medicare HMO | Attending: Emergency Medicine | Admitting: Emergency Medicine

## 2013-06-03 ENCOUNTER — Encounter (HOSPITAL_COMMUNITY): Payer: Self-pay | Admitting: Emergency Medicine

## 2013-06-03 DIAGNOSIS — R040 Epistaxis: Secondary | ICD-10-CM | POA: Insufficient documentation

## 2013-06-03 LAB — PROTIME-INR
INR: 2.44 — AB (ref 0.00–1.49)
PROTHROMBIN TIME: 25.7 s — AB (ref 11.6–15.2)

## 2013-06-03 LAB — CBC
HCT: 38.5 % — ABNORMAL LOW (ref 39.0–52.0)
HEMOGLOBIN: 12.3 g/dL — AB (ref 13.0–17.0)
MCH: 27.5 pg (ref 26.0–34.0)
MCHC: 31.9 g/dL (ref 30.0–36.0)
MCV: 86.1 fL (ref 78.0–100.0)
Platelets: 61 10*3/uL — ABNORMAL LOW (ref 150–400)
RBC: 4.47 MIL/uL (ref 4.22–5.81)
RDW: 14.8 % (ref 11.5–15.5)
WBC: 2.7 10*3/uL — AB (ref 4.0–10.5)

## 2013-06-03 NOTE — ED Notes (Signed)
Pt and family sts that they are leaving. Pt nose bleeding has stopped. Tried to get pt seen in [facility] but they refused.

## 2013-06-03 NOTE — ED Notes (Signed)
Pt and family member reports that pt having continuous nosebleed today, mainly from right nare. Pt is on coumadin. Minimal bleeding noted at triage.

## 2013-06-04 ENCOUNTER — Ambulatory Visit (INDEPENDENT_AMBULATORY_CARE_PROVIDER_SITE_OTHER): Payer: Medicare HMO | Admitting: Pharmacist Clinician (PhC)/ Clinical Pharmacy Specialist

## 2013-06-04 DIAGNOSIS — I4891 Unspecified atrial fibrillation: Secondary | ICD-10-CM

## 2013-06-04 DIAGNOSIS — I639 Cerebral infarction, unspecified: Secondary | ICD-10-CM

## 2013-06-04 DIAGNOSIS — Z7901 Long term (current) use of anticoagulants: Secondary | ICD-10-CM

## 2013-06-04 DIAGNOSIS — I6340 Cerebral infarction due to embolism of unspecified cerebral artery: Secondary | ICD-10-CM

## 2013-06-04 LAB — POCT INR: INR: 2.8

## 2013-06-18 ENCOUNTER — Ambulatory Visit: Payer: Medicare HMO | Admitting: Pharmacist Clinician (PhC)/ Clinical Pharmacy Specialist

## 2013-06-21 ENCOUNTER — Emergency Department (HOSPITAL_COMMUNITY): Payer: Medicare HMO

## 2013-06-21 ENCOUNTER — Inpatient Hospital Stay (HOSPITAL_COMMUNITY)
Admission: EM | Admit: 2013-06-21 | Discharge: 2013-06-28 | DRG: 208 | Disposition: A | Discharge status: Home Health | Payer: Medicare HMO | Attending: Internal Medicine | Admitting: Internal Medicine

## 2013-06-21 ENCOUNTER — Encounter (HOSPITAL_COMMUNITY): Payer: Self-pay | Admitting: Emergency Medicine

## 2013-06-21 DIAGNOSIS — R4182 Altered mental status, unspecified: Secondary | ICD-10-CM

## 2013-06-21 DIAGNOSIS — J90 Pleural effusion, not elsewhere classified: Secondary | ICD-10-CM

## 2013-06-21 DIAGNOSIS — H1130 Conjunctival hemorrhage, unspecified eye: Secondary | ICD-10-CM | POA: Diagnosis present

## 2013-06-21 DIAGNOSIS — E119 Type 2 diabetes mellitus without complications: Secondary | ICD-10-CM

## 2013-06-21 DIAGNOSIS — E785 Hyperlipidemia, unspecified: Secondary | ICD-10-CM

## 2013-06-21 DIAGNOSIS — R791 Abnormal coagulation profile: Secondary | ICD-10-CM | POA: Diagnosis present

## 2013-06-21 DIAGNOSIS — J942 Hemothorax: Secondary | ICD-10-CM

## 2013-06-21 DIAGNOSIS — Z9079 Acquired absence of other genital organ(s): Secondary | ICD-10-CM

## 2013-06-21 DIAGNOSIS — I5032 Chronic diastolic (congestive) heart failure: Secondary | ICD-10-CM | POA: Diagnosis present

## 2013-06-21 DIAGNOSIS — N179 Acute kidney failure, unspecified: Secondary | ICD-10-CM

## 2013-06-21 DIAGNOSIS — R509 Fever, unspecified: Secondary | ICD-10-CM

## 2013-06-21 DIAGNOSIS — I509 Heart failure, unspecified: Secondary | ICD-10-CM | POA: Diagnosis present

## 2013-06-21 DIAGNOSIS — Z8673 Personal history of transient ischemic attack (TIA), and cerebral infarction without residual deficits: Secondary | ICD-10-CM

## 2013-06-21 DIAGNOSIS — Z79899 Other long term (current) drug therapy: Secondary | ICD-10-CM

## 2013-06-21 DIAGNOSIS — D469 Myelodysplastic syndrome, unspecified: Secondary | ICD-10-CM

## 2013-06-21 DIAGNOSIS — D649 Anemia, unspecified: Secondary | ICD-10-CM

## 2013-06-21 DIAGNOSIS — D696 Thrombocytopenia, unspecified: Secondary | ICD-10-CM

## 2013-06-21 DIAGNOSIS — J189 Pneumonia, unspecified organism: Principal | ICD-10-CM

## 2013-06-21 DIAGNOSIS — Z7982 Long term (current) use of aspirin: Secondary | ICD-10-CM

## 2013-06-21 DIAGNOSIS — I4892 Unspecified atrial flutter: Secondary | ICD-10-CM | POA: Diagnosis present

## 2013-06-21 DIAGNOSIS — Z87891 Personal history of nicotine dependence: Secondary | ICD-10-CM

## 2013-06-21 DIAGNOSIS — Z6841 Body Mass Index (BMI) 40.0 and over, adult: Secondary | ICD-10-CM

## 2013-06-21 DIAGNOSIS — I639 Cerebral infarction, unspecified: Secondary | ICD-10-CM

## 2013-06-21 DIAGNOSIS — I252 Old myocardial infarction: Secondary | ICD-10-CM

## 2013-06-21 DIAGNOSIS — Z7901 Long term (current) use of anticoagulants: Secondary | ICD-10-CM

## 2013-06-21 DIAGNOSIS — G40909 Epilepsy, unspecified, not intractable, without status epilepticus: Secondary | ICD-10-CM

## 2013-06-21 DIAGNOSIS — G4733 Obstructive sleep apnea (adult) (pediatric): Secondary | ICD-10-CM | POA: Diagnosis present

## 2013-06-21 DIAGNOSIS — R569 Unspecified convulsions: Secondary | ICD-10-CM

## 2013-06-21 DIAGNOSIS — Z9861 Coronary angioplasty status: Secondary | ICD-10-CM

## 2013-06-21 DIAGNOSIS — M545 Low back pain, unspecified: Secondary | ICD-10-CM | POA: Diagnosis present

## 2013-06-21 DIAGNOSIS — J9600 Acute respiratory failure, unspecified whether with hypoxia or hypercapnia: Secondary | ICD-10-CM

## 2013-06-21 DIAGNOSIS — I5189 Other ill-defined heart diseases: Secondary | ICD-10-CM

## 2013-06-21 DIAGNOSIS — I2510 Atherosclerotic heart disease of native coronary artery without angina pectoris: Secondary | ICD-10-CM

## 2013-06-21 DIAGNOSIS — I10 Essential (primary) hypertension: Secondary | ICD-10-CM

## 2013-06-21 DIAGNOSIS — I480 Paroxysmal atrial fibrillation: Secondary | ICD-10-CM

## 2013-06-21 DIAGNOSIS — D62 Acute posthemorrhagic anemia: Secondary | ICD-10-CM | POA: Diagnosis present

## 2013-06-21 DIAGNOSIS — I959 Hypotension, unspecified: Secondary | ICD-10-CM

## 2013-06-21 DIAGNOSIS — J811 Chronic pulmonary edema: Secondary | ICD-10-CM

## 2013-06-21 DIAGNOSIS — I4891 Unspecified atrial fibrillation: Secondary | ICD-10-CM

## 2013-06-21 DIAGNOSIS — G8929 Other chronic pain: Secondary | ICD-10-CM | POA: Diagnosis present

## 2013-06-21 DIAGNOSIS — D61818 Other pancytopenia: Secondary | ICD-10-CM | POA: Diagnosis present

## 2013-06-21 HISTORY — DX: Pneumoconiosis due to asbestos and other mineral fibers: J61

## 2013-06-21 HISTORY — DX: Heart failure, unspecified: I50.9

## 2013-06-21 LAB — URINALYSIS, ROUTINE W REFLEX MICROSCOPIC
Bilirubin Urine: NEGATIVE
Glucose, UA: NEGATIVE mg/dL
Ketones, ur: NEGATIVE mg/dL
Leukocytes, UA: NEGATIVE
NITRITE: NEGATIVE
Specific Gravity, Urine: 1.028 (ref 1.005–1.030)
UROBILINOGEN UA: 1 mg/dL (ref 0.0–1.0)
pH: 6 (ref 5.0–8.0)

## 2013-06-21 LAB — CBC WITH DIFFERENTIAL/PLATELET
BASOS ABS: 0 10*3/uL (ref 0.0–0.1)
BASOS PCT: 0 % (ref 0–1)
Band Neutrophils: 0 % (ref 0–10)
Blasts: 0 %
EOS ABS: 0 10*3/uL (ref 0.0–0.7)
EOS PCT: 0 % (ref 0–5)
HCT: 34.3 % — ABNORMAL LOW (ref 39.0–52.0)
HEMOGLOBIN: 10.8 g/dL — AB (ref 13.0–17.0)
LYMPHS ABS: 2.2 10*3/uL (ref 0.7–4.0)
LYMPHS PCT: 34 % (ref 12–46)
MCH: 27 pg (ref 26.0–34.0)
MCHC: 31.5 g/dL (ref 30.0–36.0)
MCV: 85.8 fL (ref 78.0–100.0)
Metamyelocytes Relative: 0 %
Monocytes Absolute: 1.2 10*3/uL — ABNORMAL HIGH (ref 0.1–1.0)
Monocytes Relative: 18 % — ABNORMAL HIGH (ref 3–12)
Myelocytes: 0 %
NEUTROS ABS: 3.1 10*3/uL (ref 1.7–7.7)
NEUTROS PCT: 48 % (ref 43–77)
Platelets: 87 10*3/uL — ABNORMAL LOW (ref 150–400)
Promyelocytes Absolute: 0 %
RBC: 4 MIL/uL — AB (ref 4.22–5.81)
RDW: 15.3 % (ref 11.5–15.5)
WBC: 6.5 10*3/uL (ref 4.0–10.5)
nRBC: 0 /100 WBC

## 2013-06-21 LAB — COMPREHENSIVE METABOLIC PANEL
ALK PHOS: 53 U/L (ref 39–117)
ALT: 12 U/L (ref 0–53)
AST: 20 U/L (ref 0–37)
Albumin: 3.3 g/dL — ABNORMAL LOW (ref 3.5–5.2)
BUN: 12 mg/dL (ref 6–23)
CALCIUM: 8.8 mg/dL (ref 8.4–10.5)
CO2: 23 mEq/L (ref 19–32)
Chloride: 103 mEq/L (ref 96–112)
Creatinine, Ser: 1.11 mg/dL (ref 0.50–1.35)
GFR calc non Af Amer: 60 mL/min — ABNORMAL LOW (ref >90)
GFR, EST AFRICAN AMERICAN: 69 mL/min — AB (ref >90)
GLUCOSE: 115 mg/dL — AB (ref 70–99)
Potassium: 4 mEq/L (ref 3.7–5.3)
Sodium: 138 mEq/L (ref 137–147)
TOTAL PROTEIN: 9.5 g/dL — AB (ref 6.0–8.3)
Total Bilirubin: 0.5 mg/dL (ref 0.3–1.2)

## 2013-06-21 LAB — GLUCOSE, CAPILLARY: GLUCOSE-CAPILLARY: 106 mg/dL — AB (ref 70–99)

## 2013-06-21 LAB — URINE MICROSCOPIC-ADD ON

## 2013-06-21 LAB — LACTIC ACID, PLASMA: Lactic Acid, Venous: 0.6 mmol/L (ref 0.5–2.2)

## 2013-06-21 LAB — PROTIME-INR
INR: 2.5 — ABNORMAL HIGH (ref 0.00–1.49)
PROTHROMBIN TIME: 26.2 s — AB (ref 11.6–15.2)

## 2013-06-21 MED ORDER — SIMVASTATIN 20 MG PO TABS
20.0000 mg | ORAL_TABLET | Freq: Every evening | ORAL | Status: DC
  Filled 2013-06-21: qty 1

## 2013-06-21 MED ORDER — PIPERACILLIN-TAZOBACTAM 3.375 G IVPB 30 MIN: 3.3750 g | Freq: Three times a day (TID) | INTRAVENOUS | Status: DC

## 2013-06-21 MED ORDER — DILTIAZEM HCL ER 180 MG PO CP24
180.0000 mg | ORAL_CAPSULE | Freq: Every day | ORAL | Status: DC
  Filled 2013-06-21: qty 1

## 2013-06-21 MED ORDER — SODIUM CHLORIDE 0.9 % IV SOLN: 250.0000 mL | INTRAVENOUS | Status: DC | PRN

## 2013-06-21 MED ORDER — TERAZOSIN HCL 2 MG PO CAPS
2.0000 mg | ORAL_CAPSULE | Freq: Every day | ORAL | Status: DC
  Administered 2013-06-21: 2 mg via ORAL
  Filled 2013-06-21 (×2): qty 1

## 2013-06-21 MED ORDER — LEVETIRACETAM 500 MG PO TABS
500.0000 mg | ORAL_TABLET | Freq: Two times a day (BID) | ORAL | Status: DC
  Administered 2013-06-21 – 2013-06-22 (×2): 500 mg via ORAL
  Filled 2013-06-21 (×3): qty 1

## 2013-06-21 MED ORDER — INSULIN ASPART 100 UNIT/ML ~~LOC~~ SOLN: 0.0000 [IU] | Freq: Every day | SUBCUTANEOUS | Status: DC

## 2013-06-21 MED ORDER — ACETAMINOPHEN 500 MG PO TABS
1000.0000 mg | ORAL_TABLET | Freq: Once | ORAL | Status: AC
  Administered 2013-06-21: 1000 mg via ORAL
  Filled 2013-06-21: qty 2

## 2013-06-21 MED ORDER — PIPERACILLIN-TAZOBACTAM 3.375 G IVPB
3.3750 g | Freq: Three times a day (TID) | INTRAVENOUS | Status: DC
  Administered 2013-06-22: 3.375 g via INTRAVENOUS
  Filled 2013-06-21 (×4): qty 50

## 2013-06-21 MED ORDER — FUROSEMIDE 20 MG PO TABS
20.0000 mg | ORAL_TABLET | Freq: Every day | ORAL | Status: DC
  Administered 2013-06-22: 20 mg via ORAL
  Filled 2013-06-21 (×2): qty 1

## 2013-06-21 MED ORDER — INSULIN ASPART 100 UNIT/ML ~~LOC~~ SOLN
0.0000 [IU] | Freq: Three times a day (TID) | SUBCUTANEOUS | Status: DC
  Administered 2013-06-22 (×2): 1 [IU] via SUBCUTANEOUS

## 2013-06-21 MED ORDER — BIOTENE DRY MOUTH MT LIQD
15.0000 mL | Freq: Two times a day (BID) | OROMUCOSAL | Status: DC
  Administered 2013-06-21 – 2013-06-28 (×13): 15 mL via OROMUCOSAL

## 2013-06-21 MED ORDER — DEXTROSE 5 % IV SOLN
1.0000 g | Freq: Once | INTRAVENOUS | Status: AC
  Administered 2013-06-21: 1 g via INTRAVENOUS
  Filled 2013-06-21: qty 10

## 2013-06-21 MED ORDER — SODIUM CHLORIDE 0.9 % IJ SOLN: 3.0000 mL | INTRAMUSCULAR | Status: DC | PRN

## 2013-06-21 MED ORDER — DEXTROSE 5 % IV SOLN: 500.0000 mg | Freq: Once | INTRAVENOUS | Status: DC

## 2013-06-21 MED ORDER — ATENOLOL 12.5 MG HALF TABLET
12.5000 mg | ORAL_TABLET | Freq: Every day | ORAL | Status: DC
  Filled 2013-06-21: qty 1

## 2013-06-21 MED ORDER — ICOSAPENT ETHYL 1 G PO CAPS: 1.0000 g | ORAL_CAPSULE | Freq: Every day | ORAL | Status: DC

## 2013-06-21 MED ORDER — PIPERACILLIN-TAZOBACTAM 3.375 G IVPB 30 MIN
3.3750 g | Freq: Once | INTRAVENOUS | Status: AC
  Administered 2013-06-21: 3.375 g via INTRAVENOUS
  Filled 2013-06-21: qty 50

## 2013-06-21 MED ORDER — ASPIRIN EC 81 MG PO TBEC
81.0000 mg | DELAYED_RELEASE_TABLET | Freq: Every day | ORAL | Status: DC
  Administered 2013-06-22 – 2013-06-28 (×7): 81 mg via ORAL
  Filled 2013-06-21 (×7): qty 1

## 2013-06-21 MED ORDER — SODIUM CHLORIDE 0.9 % IJ SOLN
3.0000 mL | Freq: Two times a day (BID) | INTRAMUSCULAR | Status: DC
  Administered 2013-06-22 – 2013-06-23 (×2): 3 mL via INTRAVENOUS
  Administered 2013-06-23: 10 mL via INTRAVENOUS
  Administered 2013-06-24 – 2013-06-28 (×7): 3 mL via INTRAVENOUS

## 2013-06-21 MED ORDER — VANCOMYCIN HCL IN DEXTROSE 1-5 GM/200ML-% IV SOLN
1000.0000 mg | Freq: Once | INTRAVENOUS | Status: AC
Start: 2013-06-21 — End: 2013-06-22
  Administered 2013-06-21: 1000 mg via INTRAVENOUS
  Filled 2013-06-21: qty 200

## 2013-06-21 MED ORDER — SODIUM CHLORIDE 0.9 % IV BOLUS (SEPSIS)
500.0000 mL | Freq: Once | INTRAVENOUS | Status: AC
  Administered 2013-06-21: 500 mL via INTRAVENOUS

## 2013-06-21 NOTE — ED Notes (Signed)
Woke up today with weakness and fever.

## 2013-06-21 NOTE — ED Provider Notes (Signed)
CSN: [id number]     Arrival date & time 06/21/13  1809 History   First MD Initiated Contact with Patient 06/21/13 1822     Chief Complaint  Patient presents with  . Fever     (Consider location/radiation/quality/duration/timing/severity/associated sxs/prior Treatment) Patient is a 80 y.o. male presenting with fever. The history is provided by the patient. No language interpreter was used.  Fever Associated symptoms: no chest pain, no confusion, no congestion, no cough, no diarrhea, no headaches, no myalgias, no nausea, no sore throat and no vomiting   Associated symptoms comment:  The patient was found to be febrile at home by family earlier today and transported here for further evaluation. The patient denies symptoms of cough, SOB, vomiting, pain. He states he has a decreased appetite but feels fine. He initially refused transport by EMS but came prompted by family.   Past Medical History  Diagnosis Date  . Diabetes mellitus   . Hypertension   . Stroke 08/2011, 03/2012    L MCA (Afib RVR while admitted for hypotension) - expressive aphasia (almost totally recovered)  . Seizures     Post CVA; on Keppra  . OSA on CPAP   . Paroxysmal atrial fibrillation     on coumadin; Diagnosed at the time of his 1st CVA  . Obesity, morbid     BMI  . Heart attack 1997    inferior MI with left circumflex stent  . CAD S/P percutaneous coronary angioplasty 1997    status post remote PCI to the LAD and circumflex in 1997, heart cath in 2008 showed widely patent stents in the circumflex and LAD.  RCA had a mid lesion and then was totally occluded distally.  . Myelodysplastic syndrome     w/mild anemia an neutropenia  . BPH (benign prostatic hyperplasia)     TURP in 12/2010   Past Surgical History  Procedure Laterality Date  . Transthoracic echocardiogram  08/06/11    EF 55% to 65% with grade2 diastolic dysfunction/pseudonormal with mildly dilated left atrium, had evidence of elevated filling ressures  in the left vientricle and left atrium  . Cardiac catheterization  02/08/07    widely patent stent in the circumflex and LAD.  RCA had a mid lesion and then was totally occluded distally. Cook bare-metal 3.5x20 mm stent  . Transurethral resection of prostate  12/2010  . Nm myoview ltd  01/09/1999    Inferior wall thinning with possible mild ischemia versus infarct. This would correlate well with his known RCA occlusion   Family History  Problem Relation Age of Onset  . Heart disease Mother   . Heart attack Father 63   History  Substance Use Topics  . Smoking status: Former Smoker    Types: Cigarettes    Quit date: 03/08/1988  . Smokeless tobacco: Never Used  . Alcohol Use: No    Review of Systems  Constitutional: Positive for fever and appetite change.  HENT: Negative for congestion and sore throat.   Respiratory: Negative for cough and shortness of breath.   Cardiovascular: Negative for chest pain.  Gastrointestinal: Negative for nausea, vomiting, abdominal pain and diarrhea.  Musculoskeletal: Negative for myalgias.  Neurological: Negative for weakness and headaches.  Psychiatric/Behavioral: Negative for confusion.      Allergies  Review of patient's allergies indicates no known allergies.  Home Medications   Prior to Admission medications   Medication Sig Start Date End Date Taking? Authorizing Provider  aspirin EC 81 MG tablet Take 81 mg  by mouth daily.   Yes Historical Provider, MD  atenolol (TENORMIN) 25 MG tablet Take 12.5 mg by mouth daily.   Yes Historical Provider, MD  diltiazem (DILACOR XR) 180 MG 24 hr capsule Take 180 mg by mouth daily.   Yes Historical Provider, MD  fish oil-omega-3 fatty acids 1000 MG capsule Take 1 g by mouth daily.   Yes Historical Provider, MD  furosemide (LASIX) 20 MG tablet Take 1 tablet (20 mg total) by mouth daily with breakfast. 03/29/12  Yes Ivan Anchors Love, PA-C  Icosapent Ethyl (VASCEPA) 1 G CAPS Take 1 g by mouth daily.   Yes  Historical Provider, MD  levETIRAcetam (KEPPRA) 500 MG tablet Take 1 tablet (500 mg total) by mouth every 12 (twelve) hours. 02/19/13  Yes Dennie Bible, NP  nitroGLYCERIN (NITROSTAT) 0.4 MG SL tablet Place 0.4 mg under the tongue every 5 (five) minutes as needed for chest pain.   Yes Historical Provider, MD  simvastatin (ZOCOR) 20 MG tablet Take 1 tablet (20 mg total) by mouth daily. 12/08/11  Yes Grace Bushy Minor, NP  terazosin (HYTRIN) 2 MG capsule Take 2 mg by mouth at bedtime.   Yes Historical Provider, MD  Vitamin D, Ergocalciferol, (DRISDOL) 50000 UNITS CAPS Take 50,000 Units by mouth 2 (two) times a week. Takes 1 capsule on Wednesday and Saturday every week   Yes Historical Provider, MD  warfarin (COUMADIN) 5 MG tablet Take 1 & 1/2 to 2 tablets by mouth daily as directed 01/06/13  Yes Kristin Alvstad, RPH-CPP   BP 156/68  Pulse 96  Temp(Src) 103.1 F (39.5 C) (Oral)  Ht 5\' 5"  (1.651 m)  Wt 250 lb (113.399 kg)  BMI 41.60 kg/m2  SpO2 91% Physical Exam  Constitutional: He is oriented to person, place, and time. He appears well-developed and well-nourished.  HENT:  Head: Normocephalic.  Mouth/Throat: Oropharynx is clear and moist.  Eyes:  Right eye has subconjunctival hemorrhage.  Neck: Normal range of motion. Neck supple.  Cardiovascular: Normal rate and regular rhythm.   Pulmonary/Chest: Effort normal and breath sounds normal. He has no wheezes. He has no rales.  Diminished breath sounds at bases.  Abdominal: Soft. Bowel sounds are normal. There is no tenderness. There is no rebound and no guarding.  Musculoskeletal: Normal range of motion. He exhibits no edema.  Neurological: He is alert and oriented to person, place, and time.  Skin: Skin is warm and dry. No rash noted.  Psychiatric: He has a normal mood and affect.    ED Course  Procedures (including critical care time) Labs Review Labs Reviewed  URINALYSIS, ROUTINE W REFLEX MICROSCOPIC - Abnormal; Notable for the  following:    Color, Urine AMBER (*)    Hgb urine dipstick MODERATE (*)    Protein, ur >300 (*)    All other components within normal limits  CBC WITH DIFFERENTIAL - Abnormal; Notable for the following:    RBC 4.00 (*)    Hemoglobin 10.8 (*)    HCT 34.3 (*)    Platelets 87 (*)    Monocytes Relative 18 (*)    Monocytes Absolute 1.2 (*)    All other components within normal limits  COMPREHENSIVE METABOLIC PANEL - Abnormal; Notable for the following:    Glucose, Bld 115 (*)    Total Protein 9.5 (*)    Albumin 3.3 (*)    GFR calc non Af Amer 60 (*)    GFR calc Af Amer 69 (*)    All other  components within normal limits  PROTIME-INR - Abnormal; Notable for the following:    Prothrombin Time 26.2 (*)    INR 2.50 (*)    All other components within normal limits  URINE MICROSCOPIC-ADD ON - Abnormal; Notable for the following:    Casts GRANULAR CAST (*)    All other components within normal limits  URINE CULTURE  CULTURE, BLOOD (ROUTINE X 2)  CULTURE, BLOOD (ROUTINE X 2)  LACTIC ACID, PLASMA   Results for orders placed during the hospital encounter of 06/21/13  URINALYSIS, ROUTINE W REFLEX MICROSCOPIC      Result Value Ref Range   Color, Urine AMBER (*) YELLOW   APPearance CLEAR  CLEAR   Specific Gravity, Urine 1.028  1.005 - 1.030   pH 6.0  5.0 - 8.0   Glucose, UA NEGATIVE  NEGATIVE mg/dL   Hgb urine dipstick MODERATE (*) NEGATIVE   Bilirubin Urine NEGATIVE  NEGATIVE   Ketones, ur NEGATIVE  NEGATIVE mg/dL   Protein, ur >324 (*) NEGATIVE mg/dL   Urobilinogen, UA 1.0  0.0 - 1.0 mg/dL   Nitrite NEGATIVE  NEGATIVE   Leukocytes, UA NEGATIVE  NEGATIVE  CBC WITH DIFFERENTIAL      Result Value Ref Range   WBC 6.5  4.0 - 10.5 K/uL   RBC 4.00 (*) 4.22 - 5.81 MIL/uL   Hemoglobin 10.8 (*) 13.0 - 17.0 g/dL   HCT 40.1 (*) 02.7 - 25.3 %   MCV 85.8  78.0 - 100.0 fL   MCH 27.0  26.0 - 34.0 pg   MCHC 31.5  30.0 - 36.0 g/dL   RDW 66.4  40.3 - 47.4 %   Platelets 87 (*) 150 - 400 K/uL    Neutrophils Relative % 48  43 - 77 %   Lymphocytes Relative 34  12 - 46 %   Monocytes Relative 18 (*) 3 - 12 %   Eosinophils Relative 0  0 - 5 %   Basophils Relative 0  0 - 1 %   Band Neutrophils 0  0 - 10 %   Metamyelocytes Relative 0     Myelocytes 0     Promyelocytes Absolute 0     Blasts 0     nRBC 0  0 /100 WBC   Neutro Abs 3.1  1.7 - 7.7 K/uL   Lymphs Abs 2.2  0.7 - 4.0 K/uL   Monocytes Absolute 1.2 (*) 0.1 - 1.0 K/uL   Eosinophils Absolute 0.0  0.0 - 0.7 K/uL   Basophils Absolute 0.0  0.0 - 0.1 K/uL   RBC Morphology POLYCHROMASIA PRESENT     Smear Review LARGE PLATELETS PRESENT    COMPREHENSIVE METABOLIC PANEL      Result Value Ref Range   Sodium 138  137 - 147 mEq/L   Potassium 4.0  3.7 - 5.3 mEq/L   Chloride 103  96 - 112 mEq/L   CO2 23  19 - 32 mEq/L   Glucose, Bld 115 (*) 70 - 99 mg/dL   BUN 12  6 - 23 mg/dL   Creatinine, Ser 2.59  0.50 - 1.35 mg/dL   Calcium 8.8  8.4 - 56.3 mg/dL   Total Protein 9.5 (*) 6.0 - 8.3 g/dL   Albumin 3.3 (*) 3.5 - 5.2 g/dL   AST 20  0 - 37 U/L   ALT 12  0 - 53 U/L   Alkaline Phosphatase 53  39 - 117 U/L   Total Bilirubin 0.5  0.3 - 1.2 mg/dL  GFR calc non Af Amer 60 (*) >90 mL/min   GFR calc Af Amer 69 (*) >90 mL/min  PROTIME-INR      Result Value Ref Range   Prothrombin Time 26.2 (*) 11.6 - 15.2 seconds   INR 2.50 (*) 0.00 - 1.49  URINE MICROSCOPIC-ADD ON      Result Value Ref Range   RBC / HPF 3-6  <3 RBC/hpf   Casts GRANULAR CAST (*) NEGATIVE    Imaging Review Dg Chest Portable 1 View  06/21/2013   CLINICAL DATA:  80 year old male with fever. Initial encounter.  EXAM: PORTABLE CHEST - 1 VIEW  COMPARISON:  03/21/2012 and earlier.  FINDINGS: Seated AP view at 1904 hrs.  More lordotic view. Increased opacity at the left lung base most resembling moderate pleural effusion. Increased dense retrocardiac opacity. Stable cardiomegaly and mediastinal contours. No pneumothorax. No overt pulmonary edema.  IMPRESSION: New or increased  moderate left pleural effusion with increased lower lobe collapse or consolidation.   Electronically Signed   By: Lars Pinks M.D.   On: 06/21/2013 19:29     EKG Interpretation None      MDM   Final diagnoses:  None    1. CAP 2. Fever  Patient has O2 saturations of 89-91% on room air. CXR significantly abnormal with complete white out of LLL, effusion vs consolidation. He continues to deny any symptoms of SOB, cough, painful respirations, lightheadedness. He is a nonsmoker. No pain. Discussed with Dr. Roel Cluck who has accepted patient for admission.    Dewaine Oats, PA-C 06/21/13 2025

## 2013-06-21 NOTE — H&P (Signed)
PCP:  Gwynneth Aliment, MD  [facility]  Chief Complaint:  fever  HPI: Terry Robertson is a 80 y.o. male   has a past medical history of Diabetes mellitus; Hypertension; Stroke (08/2011, 03/2012); Seizures; OSA on CPAP; Paroxysmal atrial fibrillation; Obesity, morbid; Heart attack (1997); CAD S/P percutaneous coronary angioplasty (1997); Myelodysplastic syndrome; and BPH (benign prostatic hyperplasia).   Presented with  Family states that he has been feeling weak all over and had a fever. EMS was called and he was noted to be febrile up to 103 patient was brought to ER and CXR was worrisome for PNA. Patient have had mild cough. Easy bruising and bleeding. He takes coumadin for hx of PAF and has chronic thrombocytopenia with hx of MDS He have had mild cough, no chest pain, mild SOB, fatigue  Review of Systems:     Pertinent positives include:Fevers, chills,  Constitutional:  No weight loss, night sweats,  weight loss  HEENT:  No headaches, Difficulty swallowing,Tooth/dental problems,Sore throat,  No sneezing, itching, ear ache, nasal congestion, post nasal drip,  Cardio-vascular:  No chest pain, Orthopnea, PND, anasarca, dizziness, palpitations.no Bilateral lower extremity swelling  GI:  No heartburn, indigestion, abdominal pain, nausea, vomiting, diarrhea, change in bowel habits, loss of appetite, melena, blood in stool, hematemesis Resp:  no shortness of breath at rest. No dyspnea on exertion, No excess mucus, no productive cough, No non-productive cough, No coughing up of blood.No change in color of mucus.No wheezing. Skin:  no rash or lesions. No jaundice GU:  no dysuria, change in color of urine, no urgency or frequency. No straining to urinate.  No flank pain.  Musculoskeletal:  No joint pain or no joint swelling. No decreased range of motion. No back pain.  Psych:  No change in mood or affect. No depression or anxiety. No memory loss.  Neuro: no localizing  neurological complaints, no tingling, no weakness, no double vision, no gait abnormality, no slurred speech, no confusion  Otherwise ROS are negative except for above, 10 systems were reviewed  Past Medical History: Past Medical History  Diagnosis Date  . Diabetes mellitus   . Hypertension   . Stroke 08/2011, 03/2012    L MCA (Afib RVR while admitted for hypotension) - expressive aphasia (almost totally recovered)  . Seizures     Post CVA; on Keppra  . OSA on CPAP   . Paroxysmal atrial fibrillation     on coumadin; Diagnosed at the time of his 1st CVA  . Obesity, morbid     BMI  . Heart attack 1997    inferior MI with left circumflex stent  . CAD S/P percutaneous coronary angioplasty 1997    status post remote PCI to the LAD and circumflex in 1997, heart cath in 2008 showed widely patent stents in the circumflex and LAD.  RCA had a mid lesion and then was totally occluded distally.  . Myelodysplastic syndrome     w/mild anemia an neutropenia  . BPH (benign prostatic hyperplasia)     TURP in 12/2010   Past Surgical History  Procedure Laterality Date  . Transthoracic echocardiogram  08/06/11    EF 55% to 65% with grade2 diastolic dysfunction/pseudonormal with mildly dilated left atrium, had evidence of elevated filling ressures in the left vientricle and left atrium  . Cardiac catheterization  02/08/07    widely patent stent in the circumflex and LAD.  RCA had a mid lesion and then was totally occluded distally. Cook bare-metal 3.5x20 mm  stent  . Transurethral resection of prostate  12/2010  . Nm myoview ltd  01/09/1999    Inferior wall thinning with possible mild ischemia versus infarct. This would correlate well with his known RCA occlusion     Medications: Prior to Admission medications   Medication Sig Start Date End Date Taking? Authorizing Provider  aspirin EC 81 MG tablet Take 81 mg by mouth daily.   Yes Historical Provider, MD  atenolol (TENORMIN) 25 MG tablet Take 12.5 mg  by mouth daily.   Yes Historical Provider, MD  diltiazem (DILACOR XR) 180 MG 24 hr capsule Take 180 mg by mouth daily.   Yes Historical Provider, MD  fish oil-omega-3 fatty acids 1000 MG capsule Take 1 g by mouth daily.   Yes Historical Provider, MD  furosemide (LASIX) 20 MG tablet Take 1 tablet (20 mg total) by mouth daily with breakfast. 03/29/12  Yes Ivan Anchors Love, PA-C  Icosapent Ethyl (VASCEPA) 1 G CAPS Take 1 g by mouth daily.   Yes Historical Provider, MD  levETIRAcetam (KEPPRA) 500 MG tablet Take 1 tablet (500 mg total) by mouth every 12 (twelve) hours. 02/19/13  Yes Dennie Bible, NP  nitroGLYCERIN (NITROSTAT) 0.4 MG SL tablet Place 0.4 mg under the tongue every 5 (five) minutes as needed for chest pain.   Yes Historical Provider, MD  simvastatin (ZOCOR) 20 MG tablet Take 1 tablet (20 mg total) by mouth daily. 12/08/11  Yes Grace Bushy Minor, NP  terazosin (HYTRIN) 2 MG capsule Take 2 mg by mouth at bedtime.   Yes Historical Provider, MD  Vitamin D, Ergocalciferol, (DRISDOL) 50000 UNITS CAPS Take 50,000 Units by mouth 2 (two) times a week. Takes 1 capsule on Wednesday and Saturday every week   Yes Historical Provider, MD  warfarin (COUMADIN) 5 MG tablet Take 7.5 mg by mouth daily at 6 PM. Take 1 & 1/2 to 2 tablets by mouth daily as directed 01/06/13  Yes Tommy Medal, RPH-CPP    Allergies:  No Known Allergies  Social History:  Ambulatory cane Lives at   Home with family   reports that he quit smoking about 25 years ago. His smoking use included Cigarettes. He smoked 0.00 packs per day. He has never used smokeless tobacco. He reports that he does not drink alcohol or use illicit drugs.   Family History: family history includes Heart attack (age of onset: 48) in his father; Heart disease in his mother.    Physical Exam: Patient Vitals for the past 24 hrs:  BP Temp Temp src Pulse SpO2 Height Weight  06/21/13 2105 - - - - 99 % - -  06/21/13 2045 149/68 mmHg - - 92 96 % - -   06/21/13 2000 152/63 mmHg - - 96 92 % - -  06/21/13 1927 - - - - 91 % - -  06/21/13 1900 156/68 mmHg - - 96 100 % - -  06/21/13 1858 - 103.1 F (39.5 C) - - - - -  06/21/13 1845 148/64 mmHg - - 95 97 % - -  06/21/13 1830 145/63 mmHg - - 96 96 % - -  06/21/13 1815 152/65 mmHg - - 98 97 % - -  06/21/13 1811 - 103.1 F (39.5 C) Oral 96 94 % 5\' 5"  (1.651 m) 113.399 kg (250 lb)    1. General:  in No Acute distress 2. Psychological: Alert and   Oriented 3. Head/ENT:   Moist  Mucous Membranes  Head Non traumatic, neck supple, hemoragic conjunctiva of right eye                          Normal  Dentition 4. SKIN: normal   Skin turgor,  Skin clean Dry and intact no rash 5. Heart: Regular rate and rhythm no Murmur, Rub or gallop 6. Lungs: diminished air movement on the left base   7. Abdomen: Soft, non-tender, Non distended 8. Lower extremities: no clubbing, cyanosis, or edema 9. Neurologically Grossly intact, moving all 4 extremities equally 10. MSK: Normal range of motion  body mass index is 41.6 kg/(m^2).   Labs on Admission:   Recent Labs  06/21/13 1841  NA 138  K 4.0  CL 103  CO2 23  GLUCOSE 115*  BUN 12  CREATININE 1.11  CALCIUM 8.8    Recent Labs  06/21/13 1841  AST 20  ALT 12  ALKPHOS 53  BILITOT 0.5  PROT 9.5*  ALBUMIN 3.3*   No results found for this basename: LIPASE, AMYLASE,  in the last 72 hours  Recent Labs  06/21/13 1841  WBC 6.5  NEUTROABS 3.1  HGB 10.8*  HCT 34.3*  MCV 85.8  PLT 87*   No results found for this basename: CKTOTAL, CKMB, CKMBINDEX, TROPONINI,  in the last 72 hours No results found for this basename: TSH, T4TOTAL, FREET3, T3FREE, THYROIDAB,  in the last 72 hours No results found for this basename: VITAMINB12, FOLATE, FERRITIN, TIBC, IRON, RETICCTPCT,  in the last 72 hours Lab Results  Component Value Date   HGBA1C 5.7* 03/20/2012    Estimated Creatinine Clearance: 59.7 ml/min (by C-G formula based on  Cr of 1.11). ABG    Component Value Date/Time   PHART 7.324* 12/02/2011 0345   HCO3 22.4 12/02/2011 0345   TCO2 27 03/16/2012 1739   ACIDBASEDEF 2.7* 12/02/2011 0345   O2SAT 97.5 12/02/2011 0345     No results found for this basename: DDIMER        UA  Proteinuria     Cultures:    Component Value Date/Time   SDES BLOOD HAND LEFT 03/19/2012 1218   SPECREQUEST BOTTLES DRAWN AEROBIC AND ANAEROBIC 10CC EACH 03/19/2012 1218   CULT NO GROWTH 5 DAYS 03/19/2012 1218   REPTSTATUS 03/25/2012 FINAL 03/19/2012 1218       Radiological Exams on Admission: Dg Chest Portable 1 View  06/21/2013   CLINICAL DATA:  80 year old male with fever. Initial encounter.  EXAM: PORTABLE CHEST - 1 VIEW  COMPARISON:  03/21/2012 and earlier.  FINDINGS: Seated AP view at 1904 hrs.  More lordotic view. Increased opacity at the left lung base most resembling moderate pleural effusion. Increased dense retrocardiac opacity. Stable cardiomegaly and mediastinal contours. No pneumothorax. No overt pulmonary edema.  IMPRESSION: New or increased moderate left pleural effusion with increased lower lobe collapse or consolidation.   Electronically Signed   By: Lars Pinks M.D.   On: 06/21/2013 19:29    Chart has been reviewed  Assessment/Plan  80 year old male with history of coronary disease, diabetes, myelodysplastic syndrome and atrial fibrillation on chronic Coumadin who presents with fever and chest x-ray worrisome for pneumonia in a setting of likely immunosuppression from her myelodysplastic syndrome  Present on Admission:  . CAP (community acquired pneumonia) - obtain blood cultures and sputum cultures cover broadly given possible immunosuppression due to myelodysplastic syndrome. We'll attempt to obtain CT to evaluate this father make sure there is no effusion and postobstructive  pneumonia  . CAD (coronary artery disease) PCI to circumflex in 1997.  100% occluded RCA - currently chest pain-free  . DM type 2 (diabetes  mellitus, type 2) - usually diet controlled put on sliding scale  . Myelodysplastic syndrome - patient was on conservative treatment up until now given no symptomatology. After discharge would refer to his oncologist for followup  . PAF (paroxysmal atrial fibrillation) - given thrombocytopenia below 100 and conjunctiva hemorrhage would hold Coumadin for now  . Thrombocytopenia, chronic - given easy bruising and bleeding and conjunctiva hemorrhage and thrombocytopenia below 85 for now would hold Coumadin  . Seizure disorder - continue home medication      Prophylaxis: SCDs patient is thrombocytopenic and currently elevated INR , Protonix  CODE STATUS: FULL CODE  Other plan as per orders.  I have spent a total of 55 min on this admission  Quanasia Defino 06/21/2013, 9:17 PM

## 2013-06-22 ENCOUNTER — Inpatient Hospital Stay (HOSPITAL_COMMUNITY): Payer: Medicare HMO

## 2013-06-22 ENCOUNTER — Encounter (HOSPITAL_COMMUNITY): Payer: Self-pay

## 2013-06-22 DIAGNOSIS — R509 Fever, unspecified: Secondary | ICD-10-CM

## 2013-06-22 DIAGNOSIS — G40909 Epilepsy, unspecified, not intractable, without status epilepticus: Secondary | ICD-10-CM

## 2013-06-22 DIAGNOSIS — I10 Essential (primary) hypertension: Secondary | ICD-10-CM

## 2013-06-22 DIAGNOSIS — J9600 Acute respiratory failure, unspecified whether with hypoxia or hypercapnia: Secondary | ICD-10-CM

## 2013-06-22 DIAGNOSIS — R4182 Altered mental status, unspecified: Secondary | ICD-10-CM

## 2013-06-22 DIAGNOSIS — I6340 Cerebral infarction due to embolism of unspecified cerebral artery: Secondary | ICD-10-CM

## 2013-06-22 DIAGNOSIS — D696 Thrombocytopenia, unspecified: Secondary | ICD-10-CM

## 2013-06-22 LAB — COMPREHENSIVE METABOLIC PANEL
ALBUMIN: 3 g/dL — AB (ref 3.5–5.2)
ALK PHOS: 51 U/L (ref 39–117)
ALK PHOS: 54 U/L (ref 39–117)
ALT: 13 U/L (ref 0–53)
ALT: 17 U/L (ref 0–53)
AST: 21 U/L (ref 0–37)
AST: 25 U/L (ref 0–37)
Albumin: 3.1 g/dL — ABNORMAL LOW (ref 3.5–5.2)
BILIRUBIN TOTAL: 0.6 mg/dL (ref 0.3–1.2)
BUN: 11 mg/dL (ref 6–23)
BUN: 12 mg/dL (ref 6–23)
CHLORIDE: 101 meq/L (ref 96–112)
CO2: 21 mEq/L (ref 19–32)
CO2: 22 meq/L (ref 19–32)
Calcium: 8.4 mg/dL (ref 8.4–10.5)
Calcium: 8.4 mg/dL (ref 8.4–10.5)
Chloride: 102 mEq/L (ref 96–112)
Creatinine, Ser: 0.98 mg/dL (ref 0.50–1.35)
Creatinine, Ser: 1.44 mg/dL — ABNORMAL HIGH (ref 0.50–1.35)
GFR calc Af Amer: 86 mL/min — ABNORMAL LOW (ref >90)
GFR calc non Af Amer: 74 mL/min — ABNORMAL LOW (ref >90)
GFR, EST AFRICAN AMERICAN: 51 mL/min — AB (ref >90)
GFR, EST NON AFRICAN AMERICAN: 44 mL/min — AB (ref >90)
GLUCOSE: 189 mg/dL — AB (ref 70–99)
Glucose, Bld: 147 mg/dL — ABNORMAL HIGH (ref 70–99)
POTASSIUM: 4.1 meq/L (ref 3.7–5.3)
Potassium: 3.9 mEq/L (ref 3.7–5.3)
SODIUM: 137 meq/L (ref 137–147)
SODIUM: 140 meq/L (ref 137–147)
TOTAL PROTEIN: 9 g/dL — AB (ref 6.0–8.3)
Total Bilirubin: 0.6 mg/dL (ref 0.3–1.2)
Total Protein: 8.7 g/dL — ABNORMAL HIGH (ref 6.0–8.3)

## 2013-06-22 LAB — CBC WITH DIFFERENTIAL/PLATELET
BASOS ABS: 0 10*3/uL (ref 0.0–0.1)
BASOS PCT: 0 % (ref 0–1)
Eosinophils Absolute: 0 10*3/uL (ref 0.0–0.7)
Eosinophils Relative: 0 % (ref 0–5)
HCT: 26 % — ABNORMAL LOW (ref 39.0–52.0)
Hemoglobin: 7.9 g/dL — ABNORMAL LOW (ref 13.0–17.0)
Lymphocytes Relative: 4 % — ABNORMAL LOW (ref 12–46)
Lymphs Abs: 0.4 10*3/uL — ABNORMAL LOW (ref 0.7–4.0)
MCH: 26.1 pg (ref 26.0–34.0)
MCHC: 30.4 g/dL (ref 30.0–36.0)
MCV: 85.8 fL (ref 78.0–100.0)
MONO ABS: 1.2 10*3/uL — AB (ref 0.1–1.0)
MONOS PCT: 12 % (ref 3–12)
NEUTROS ABS: 8.2 10*3/uL — AB (ref 1.7–7.7)
NEUTROS PCT: 84 % — AB (ref 43–77)
Platelets: 71 10*3/uL — ABNORMAL LOW (ref 150–400)
RBC: 3.03 MIL/uL — ABNORMAL LOW (ref 4.22–5.81)
RDW: 15.4 % (ref 11.5–15.5)
WBC: 9.8 10*3/uL (ref 4.0–10.5)

## 2013-06-22 LAB — PRO B NATRIURETIC PEPTIDE: PRO B NATRI PEPTIDE: 7439 pg/mL — AB (ref 0–450)

## 2013-06-22 LAB — GLUCOSE, CAPILLARY
GLUCOSE-CAPILLARY: 140 mg/dL — AB (ref 70–99)
Glucose-Capillary: 128 mg/dL — ABNORMAL HIGH (ref 70–99)
Glucose-Capillary: 144 mg/dL — ABNORMAL HIGH (ref 70–99)
Glucose-Capillary: 196 mg/dL — ABNORMAL HIGH (ref 70–99)

## 2013-06-22 LAB — BLOOD GAS, ARTERIAL
Acid-base deficit: 0.9 mmol/L (ref 0.0–2.0)
Bicarbonate: 23.6 mEq/L (ref 20.0–24.0)
DRAWN BY: 246861
O2 CONTENT: 4 L/min
O2 SAT: 84.9 %
PCO2 ART: 41.5 mmHg (ref 35.0–45.0)
Patient temperature: 98.6
TCO2: 24.9 mmol/L (ref 0–100)
pH, Arterial: 7.373 (ref 7.350–7.450)
pO2, Arterial: 52.2 mmHg — ABNORMAL LOW (ref 80.0–100.0)

## 2013-06-22 LAB — CBC
HCT: 26.3 % — ABNORMAL LOW (ref 39.0–52.0)
HEMATOCRIT: 26.9 % — AB (ref 39.0–52.0)
Hemoglobin: 8.3 g/dL — ABNORMAL LOW (ref 13.0–17.0)
Hemoglobin: 8.1 g/dL — ABNORMAL LOW (ref 13.0–17.0)
MCH: 26.6 pg (ref 26.0–34.0)
MCH: 26.4 pg (ref 26.0–34.0)
MCHC: 30.9 g/dL (ref 30.0–36.0)
MCHC: 30.8 g/dL (ref 30.0–36.0)
MCV: 86.2 fL (ref 78.0–100.0)
MCV: 85.7 fL (ref 78.0–100.0)
PLATELETS: 65 10*3/uL — AB (ref 150–400)
PLATELETS: 65 10*3/uL — AB (ref 150–400)
RBC: 3.12 MIL/uL — ABNORMAL LOW (ref 4.22–5.81)
RBC: 3.07 MIL/uL — AB (ref 4.22–5.81)
RDW: 15.5 % (ref 11.5–15.5)
RDW: 15.4 % (ref 11.5–15.5)
WBC: 11.4 10*3/uL — AB (ref 4.0–10.5)
WBC: 10.4 10*3/uL (ref 4.0–10.5)

## 2013-06-22 LAB — BODY FLUID CELL COUNT WITH DIFFERENTIAL
Eos, Fluid: 0 %
Lymphs, Fluid: 42 %
MONOCYTE-MACROPHAGE-SEROUS FLUID: 3 % — AB (ref 50–90)
Neutrophil Count, Fluid: 55 % — ABNORMAL HIGH (ref 0–25)
Total Nucleated Cell Count, Fluid: 291 cu mm (ref 0–1000)

## 2013-06-22 LAB — LACTIC ACID, PLASMA
LACTIC ACID, VENOUS: 1.3 mmol/L (ref 0.5–2.2)
Lactic Acid, Venous: 1.5 mmol/L (ref 0.5–2.2)

## 2013-06-22 LAB — POCT I-STAT 3, ART BLOOD GAS (G3+)
ACID-BASE DEFICIT: 3 mmol/L — AB (ref 0.0–2.0)
BICARBONATE: 22.6 meq/L (ref 20.0–24.0)
O2 Saturation: 99 %
PH ART: 7.334 — AB (ref 7.350–7.450)
Patient temperature: 102
TCO2: 24 mmol/L (ref 0–100)
pCO2 arterial: 43.3 mmHg (ref 35.0–45.0)
pO2, Arterial: 168 mmHg — ABNORMAL HIGH (ref 80.0–100.0)

## 2013-06-22 LAB — HEMATOCRIT, BODY FLUID: Hematocrit, Fluid: 11.9 %

## 2013-06-22 LAB — PROTIME-INR
INR: 1.48 (ref 0.00–1.49)
INR: 1.43 (ref 0.00–1.49)
PROTHROMBIN TIME: 17.5 s — AB (ref 11.6–15.2)
Prothrombin Time: 17.1 seconds — ABNORMAL HIGH (ref 11.6–15.2)

## 2013-06-22 LAB — APTT: aPTT: 49 seconds — ABNORMAL HIGH (ref 24–37)

## 2013-06-22 LAB — LACTATE DEHYDROGENASE, PLEURAL OR PERITONEAL FLUID: LD FL: 1100 U/L — AB (ref 3–23)

## 2013-06-22 LAB — TROPONIN I
Troponin I: <0.3 ng/mL (ref <0.30)
Troponin I: <0.3 ng/mL (ref <0.30)

## 2013-06-22 LAB — GLUCOSE, SEROUS FLUID: Glucose, Fluid: 124 mg/dL

## 2013-06-22 LAB — PHOSPHORUS: Phosphorus: 4.2 mg/dL (ref 2.3–4.6)

## 2013-06-22 LAB — PROTEIN, BODY FLUID: Total protein, fluid: 7.2 g/dL

## 2013-06-22 LAB — TRIGLYCERIDES: TRIGLYCERIDES: 109 mg/dL (ref <150)

## 2013-06-22 LAB — PROCALCITONIN: Procalcitonin: 1.16 ng/mL

## 2013-06-22 LAB — ABO/RH: ABO/RH(D): O POS

## 2013-06-22 LAB — MAGNESIUM: Magnesium: 2.1 mg/dL (ref 1.5–2.5)

## 2013-06-22 LAB — STREP PNEUMONIAE URINARY ANTIGEN: Strep Pneumo Urinary Antigen: NEGATIVE

## 2013-06-22 MED ORDER — FENTANYL CITRATE 0.05 MG/ML IJ SOLN
INTRAMUSCULAR | Status: AC
  Administered 2013-06-22: 100 ug
  Filled 2013-06-22: qty 4

## 2013-06-22 MED ORDER — DILTIAZEM LOAD VIA INFUSION
15.0000 mg | Freq: Once | INTRAVENOUS | Status: AC
  Administered 2013-06-22: 15 mg via INTRAVENOUS
  Filled 2013-06-22: qty 15

## 2013-06-22 MED ORDER — VITAMIN K1 10 MG/ML IJ SOLN
5.0000 mg | Freq: Once | INTRAVENOUS | Status: AC
  Administered 2013-06-22: 5 mg via INTRAVENOUS
  Filled 2013-06-22: qty 0.5

## 2013-06-22 MED ORDER — ATENOLOL 12.5 MG HALF TABLET
12.5000 mg | ORAL_TABLET | Freq: Every day | ORAL | Status: DC
  Administered 2013-06-22: 12.5 mg via ORAL
  Filled 2013-06-22: qty 1

## 2013-06-22 MED ORDER — PROPOFOL 10 MG/ML IV EMUL
INTRAVENOUS | Status: AC
  Filled 2013-06-22: qty 100

## 2013-06-22 MED ORDER — PROPOFOL 10 MG/ML IV BOLUS
INTRAVENOUS | Status: AC
  Administered 2013-06-22: 80 mg
  Filled 2013-06-22: qty 20

## 2013-06-22 MED ORDER — MIDAZOLAM HCL 2 MG/2ML IJ SOLN
2.0000 mg | Freq: Once | INTRAMUSCULAR | Status: AC
  Administered 2013-06-22: 2 mg via INTRAVENOUS

## 2013-06-22 MED ORDER — SODIUM CHLORIDE 0.9 % IV SOLN
500.0000 mg | Freq: Two times a day (BID) | INTRAVENOUS | Status: DC
  Administered 2013-06-22 – 2013-06-24 (×4): 500 mg via INTRAVENOUS
  Filled 2013-06-22 (×4): qty 5

## 2013-06-22 MED ORDER — MIDAZOLAM HCL 2 MG/2ML IJ SOLN
INTRAMUSCULAR | Status: AC
  Administered 2013-06-22: 2 mg
  Filled 2013-06-22: qty 4

## 2013-06-22 MED ORDER — FAMOTIDINE IN NACL 20-0.9 MG/50ML-% IV SOLN
20.0000 mg | Freq: Two times a day (BID) | INTRAVENOUS | Status: DC
  Administered 2013-06-22 – 2013-06-23 (×3): 20 mg via INTRAVENOUS
  Filled 2013-06-22 (×4): qty 50

## 2013-06-22 MED ORDER — DILTIAZEM HCL 100 MG IV SOLR
5.0000 mg/h | INTRAVENOUS | Status: DC
  Administered 2013-06-22: 5 mg/h via INTRAVENOUS
  Filled 2013-06-22: qty 100

## 2013-06-22 MED ORDER — LEVOFLOXACIN IN D5W 750 MG/150ML IV SOLN
750.0000 mg | INTRAVENOUS | Status: DC
  Administered 2013-06-22: 750 mg via INTRAVENOUS
  Filled 2013-06-22 (×2): qty 150

## 2013-06-22 MED ORDER — METHYLPREDNISOLONE SODIUM SUCC 125 MG IJ SOLR
125.0000 mg | Freq: Three times a day (TID) | INTRAMUSCULAR | Status: DC
  Administered 2013-06-22 – 2013-06-24 (×6): 125 mg via INTRAVENOUS
  Filled 2013-06-22 (×12): qty 2

## 2013-06-22 MED ORDER — FENTANYL CITRATE 0.05 MG/ML IJ SOLN
100.0000 ug | Freq: Once | INTRAMUSCULAR | Status: AC
  Administered 2013-06-22: 100 ug via INTRAVENOUS

## 2013-06-22 MED ORDER — "THROMBI-PAD 3""X3"" EX PADS"
1.0000 | MEDICATED_PAD | Freq: Once | CUTANEOUS | Status: AC
  Administered 2013-06-22: 1 via TOPICAL
  Filled 2013-06-22: qty 1

## 2013-06-22 MED ORDER — IOHEXOL 300 MG/ML  SOLN
80.0000 mL | Freq: Once | INTRAMUSCULAR | Status: AC | PRN
  Administered 2013-06-22: 100 mL via INTRAVENOUS

## 2013-06-22 MED ORDER — DILTIAZEM HCL ER 180 MG PO CP24
180.0000 mg | ORAL_CAPSULE | Freq: Every day | ORAL | Status: DC
  Administered 2013-06-22: 180 mg via ORAL
  Filled 2013-06-22: qty 1

## 2013-06-22 MED ORDER — INSULIN ASPART 100 UNIT/ML ~~LOC~~ SOLN
1.0000 [IU] | SUBCUTANEOUS | Status: DC
  Administered 2013-06-22: 2 [IU] via SUBCUTANEOUS
  Administered 2013-06-22 – 2013-06-23 (×2): 1 [IU] via SUBCUTANEOUS
  Administered 2013-06-23 (×3): 2 [IU] via SUBCUTANEOUS
  Administered 2013-06-23 (×2): 1 [IU] via SUBCUTANEOUS
  Administered 2013-06-24: 2 [IU] via SUBCUTANEOUS
  Administered 2013-06-24: 1 [IU] via SUBCUTANEOUS
  Administered 2013-06-24 (×2): 2 [IU] via SUBCUTANEOUS
  Administered 2013-06-24: 1 [IU] via SUBCUTANEOUS
  Administered 2013-06-24 – 2013-06-25 (×2): 2 [IU] via SUBCUTANEOUS
  Administered 2013-06-25 (×3): 1 [IU] via SUBCUTANEOUS

## 2013-06-22 MED ORDER — SODIUM CHLORIDE 0.9 % IV SOLN: 250.0000 mL | INTRAVENOUS | Status: DC | PRN

## 2013-06-22 MED ORDER — DEXTROSE 5 % IV SOLN
500.0000 mg | INTRAVENOUS | Status: DC
  Filled 2013-06-22: qty 500

## 2013-06-22 MED ORDER — DEXTROSE 5 % IV SOLN
5.0000 mg/h | INTRAVENOUS | Status: DC
  Filled 2013-06-22: qty 100

## 2013-06-22 MED ORDER — PROPOFOL 10 MG/ML IV EMUL
0.0000 ug/kg/min | INTRAVENOUS | Status: DC
  Administered 2013-06-22: 25 ug/kg/min via INTRAVENOUS
  Administered 2013-06-22 – 2013-06-23 (×3): 30 ug/kg/min via INTRAVENOUS
  Filled 2013-06-22 (×3): qty 100

## 2013-06-22 MED ORDER — VANCOMYCIN HCL 10 G IV SOLR
1500.0000 mg | INTRAVENOUS | Status: DC
  Administered 2013-06-23 – 2013-06-24 (×2): 1500 mg via INTRAVENOUS
  Filled 2013-06-22 (×3): qty 1500

## 2013-06-22 MED ORDER — PIPERACILLIN-TAZOBACTAM 3.375 G IVPB
3.3750 g | Freq: Three times a day (TID) | INTRAVENOUS | Status: DC
  Administered 2013-06-22 – 2013-06-24 (×6): 3.375 g via INTRAVENOUS
  Filled 2013-06-22 (×8): qty 50

## 2013-06-22 MED ORDER — ALBUTEROL SULFATE (2.5 MG/3ML) 0.083% IN NEBU
2.5000 mg | INHALATION_SOLUTION | RESPIRATORY_TRACT | Status: DC | PRN
  Administered 2013-06-22: 2.5 mg via RESPIRATORY_TRACT
  Filled 2013-06-22: qty 3

## 2013-06-22 MED ORDER — ATORVASTATIN CALCIUM 10 MG PO TABS
10.0000 mg | ORAL_TABLET | Freq: Every day | ORAL | Status: DC
  Filled 2013-06-22: qty 1

## 2013-06-22 MED ORDER — VANCOMYCIN HCL IN DEXTROSE 1-5 GM/200ML-% IV SOLN
1000.0000 mg | Freq: Once | INTRAVENOUS | Status: AC
  Administered 2013-06-22: 1000 mg via INTRAVENOUS
  Filled 2013-06-22: qty 200

## 2013-06-22 NOTE — Significant Event (Addendum)
Rapid Response Event Note  Overview:  Called to assist with patient with decreased O2sats and increased WOB Time Called: 0600 Arrival Time: 1350 Event Type: Respiratory  Initial Focused Assessment: On arrival patient alert - skin hot and diaphoretic - able to speak few words but very SOB - following commands - resps rapid and shallow - very little air movement on left side -some  inspiratory wheezing right side - on NRB mask - CT scan shows pleural effusion left side without ptx  - HR 115 -  RR 36 - O2 sats 95% on NRB mask - abd large soft - incontinent of urine - his norm per RN.  IV patent.  Dr. Aileen Fass at the bedside.  BP 156/86.     Interventions:  Stat ABG drawn.  Solumedrol given per order MD.  Nebulizer treatment done.  Called for BiPap for increased WOB.  Dr. Aileen Fass advised of CT chest results.  He called PCCM for consult.  Patient placed on BiPap per RT to bridge WOB with possible need for intubation.  ICU bed request made.  After 10 minutes on Bipap patient resting some better - able to speak full sentence - states he feels better - still very little air movement on left side- skin now warm and dry.  Was started on IV Cardizem drip with 15 mg IV load and the 5 mg per hour per Vickii Chafe RN and Tresa Moore  RN per order Dr. Aileen Fass. BP 148/74 HR 108 RR 28 O2 sats 100% on Bipap.  Transferred to 4H99 by [facility] staff - RT and Dr Aileen Fass.  Handoff to [employer] - left for next call.     Event Summary: Name of Physician Notified: Dr. Aileen Fass at  (PTA RRT)  Name of Consulting Physician Notified: Dr. Wyvonna Plum at  [phone] per Dr. Aileen Fass)  Outcome: Transferred (Comment) [phone])  Event End Time: [place]

## 2013-06-22 NOTE — H&P (Signed)
PULMONARY / CRITICAL CARE MEDICINE   Name: Ester Mabe MRN: [id number] DOB: [birth year]    ADMISSION DATE:  06/21/2013 CONSULTATION DATE:  06/22/13  REFERRING MD :  [facility] PRIMARY SERVICE: PCCM  CHIEF COMPLAINT:  Respiratory distress  BRIEF PATIENT DESCRIPTION: Patient is a 80 year old man with a PMH of Stroke (08/2011, 03/2012); Seizures; PAF on coumadin; CAD S/P percutaneous coronary angioplasty (1997); chronic thrombocytopenia and Myelodysplastic syndrome, who presented with CAP initially. He was noted to have acute respiratory distress and large left pleural effusion, and was subsequently transferred to ICU for management.Marland Kitchen   SIGNIFICANT EVENTS / STUDIES:  4/18 Admit for CAP 4/18 Transfer to ICU. Acute respiratory failure and intubated. Left chest tube insertion planned 4/18 FFP x 4 and Vit K 5 mg IV x 1 for INR reverse   LINES / TUBES: 4/18 left  IJ>>> 4/18 OETT>>> 4/18 Left chest tube>>>  CULTURES: 4/18 blood>>> 4/18 urine>>> 4/18 sputum>>> 4/18 Urine strep>>> 4/18 urine Legionella>>>  4/18 Lactic acid>>>0.6 4/19 PCT>>>  ANTIBIOTICS: Vancomycin 4/18>>>  Zosyn 4/18>>>  HISTORY OF PRESENT ILLNESS:  Patient is a 80 year old man with a PMH of T2DM, HTN, Stroke (08/2011, 03/2012); Seizures; OSA on CPAP; Paroxysmal atrial fibrillation on coumadin; Obesity, morbid; Heart attack (1997); CAD S/P percutaneous coronary angioplasty (1997); Myelodysplastic syndrome; and BPH (benign prostatic hyperplasia).   Patient has been feeling weak all over and had a fever. EMS was called and he was noted to be febrile up to 103.  patient was brought to ER and CXR was worrisome for PNA.   Patient have had mild cough. Easy bruising and bleeding. He takes coumadin for hx of PAF and has chronic thrombocytopenia with hx of MDS.    PAST MEDICAL HISTORY :  Past Medical History  Diagnosis Date  . Diabetes mellitus   . Hypertension   . Stroke 08/2011, 03/2012    L MCA (Afib RVR while admitted for  hypotension) - expressive aphasia (almost totally recovered)  . Seizures     Post CVA; on Keppra  . OSA on CPAP   . Paroxysmal atrial fibrillation     on coumadin; Diagnosed at the time of his 1st CVA  . Obesity, morbid     BMI  . Heart attack 1997    inferior MI with left circumflex stent  . CAD S/P percutaneous coronary angioplasty 1997    status post remote PCI to the LAD and circumflex in 1997, heart cath in 2008 showed widely patent stents in the circumflex and LAD.  RCA had a mid lesion and then was totally occluded distally.  . Myelodysplastic syndrome     w/mild anemia an neutropenia  . BPH (benign prostatic hyperplasia)     TURP in 12/2010  . CHF (congestive heart failure)    Past Surgical History  Procedure Laterality Date  . Transthoracic echocardiogram  08/06/11    EF 55% to 65% with grade2 diastolic dysfunction/pseudonormal with mildly dilated left atrium, had evidence of elevated filling ressures in the left vientricle and left atrium  . Cardiac catheterization  02/08/07    widely patent stent in the circumflex and LAD.  RCA had a mid lesion and then was totally occluded distally. Cook bare-metal 3.5x20 mm stent  . Transurethral resection of prostate  12/2010  . Nm myoview ltd  01/09/1999    Inferior wall thinning with possible mild ischemia versus infarct. This would correlate well with his known RCA occlusion   Prior to Admission medications  Medication Sig Start Date End Date Taking? Authorizing Provider  aspirin EC 81 MG tablet Take 81 mg by mouth daily.   Yes Historical Provider, MD  atenolol (TENORMIN) 25 MG tablet Take 12.5 mg by mouth daily.   Yes Historical Provider, MD  diltiazem (DILACOR XR) 180 MG 24 hr capsule Take 180 mg by mouth daily.   Yes Historical Provider, MD  fish oil-omega-3 fatty acids 1000 MG capsule Take 1 g by mouth daily.   Yes Historical Provider, MD  furosemide (LASIX) 20 MG tablet Take 1 tablet (20 mg total) by mouth daily with breakfast.  03/29/12  Yes Ivan Anchors Love, PA-C  Icosapent Ethyl (VASCEPA) 1 G CAPS Take 1 g by mouth daily.   Yes Historical Provider, MD  levETIRAcetam (KEPPRA) 500 MG tablet Take 1 tablet (500 mg total) by mouth every 12 (twelve) hours. 02/19/13  Yes Dennie Bible, NP  nitroGLYCERIN (NITROSTAT) 0.4 MG SL tablet Place 0.4 mg under the tongue every 5 (five) minutes as needed for chest pain.   Yes Historical Provider, MD  simvastatin (ZOCOR) 20 MG tablet Take 1 tablet (20 mg total) by mouth daily. 12/08/11  Yes Grace Bushy Minor, NP  terazosin (HYTRIN) 2 MG capsule Take 2 mg by mouth at bedtime.   Yes Historical Provider, MD  Vitamin D, Ergocalciferol, (DRISDOL) 50000 UNITS CAPS Take 50,000 Units by mouth 2 (two) times a week. Takes 1 capsule on Wednesday and Saturday every week   Yes Historical Provider, MD  warfarin (COUMADIN) 5 MG tablet Take 7.5 mg by mouth daily at 6 PM. Take 1 & 1/2 to 2 tablets by mouth daily as directed 01/06/13  Yes Tommy Medal, RPH-CPP   No Known Allergies  FAMILY HISTORY:  Family History  Problem Relation Age of Onset  . Heart disease Mother   . Heart attack Father 73   SOCIAL HISTORY:  reports that he quit smoking about 25 years ago. His smoking use included Cigarettes. He smoked 0.00 packs per day. He has never used smokeless tobacco. He reports that he does not drink alcohol or use illicit drugs.  REVIEW OF SYSTEMS:   Dyspnea and unable to obtain due to respiratory failure  VITAL SIGNS: Temp:  [97 F (36.1 C)-103.1 F (39.5 C)] 97 F (36.1 C) (04/19 0534) Pulse Rate:  [92-128] 108 (04/19 1340) Resp:  [18-28] 28 (04/19 1011) BP: (143-167)/(63-88) 151/78 mmHg (04/19 1340) SpO2:  [88 %-100 %] 90 % (04/19 1359) FiO2 (%):  [36 %] 36 % (04/19 1359) Weight:  [250 lb (113.399 kg)-250 lb 14.4 oz (113.807 kg)] 250 lb 14.4 oz (113.807 kg) (04/18 2313) HEMODYNAMICS:   VENTILATOR SETTINGS: Vent Mode:  [-]  FiO2 (%):  [36 %] 36 % INTAKE / OUTPUT: Intake/Output      04/18 0701 - 04/19 0700 04/19 0701 - 04/20 0700   P.O. 240 120   IV Piggyback 50    Total Intake(mL/kg) 290 (2.5) 120 (1.1)   Net +290 +120        Urine Occurrence 1 x 1 x   Stool Occurrence  1 x     PHYSICAL EXAMINATION: General:  severe distress Neuro:  Alert and oriented x 2 HEENT:  No JVD Cardiovascular: RRR, no M/G/R   Lungs:  Very diminished lung sounds Abdomen:  Soft. NTND. BS x 4 Musculoskeletal:  PPP Skin:  intact  LABS:  CBC  Recent Labs Lab 06/21/13 1841  WBC 6.5  HGB 10.8*  HCT 34.3*  PLT 87*  Coag's  Recent Labs Lab 06/21/13 1841  INR 2.50*   BMET  Recent Labs Lab 06/21/13 1841 06/22/13 0500  NA 138 137  K 4.0 3.9  CL 103 102  CO2 23 21  BUN 12 11  CREATININE 1.11 0.98  GLUCOSE 115* 147*   Electrolytes  Recent Labs Lab 06/21/13 1841 06/22/13 0500  CALCIUM 8.8 8.4   Sepsis Markers  Recent Labs Lab 06/21/13 2034  LATICACIDVEN 0.6   ABG  Recent Labs Lab 06/22/13 1400  PHART 7.373  PCO2ART 41.5  PO2ART 52.2*   Liver Enzymes  Recent Labs Lab 06/21/13 1841 06/22/13 0500  AST 20 21  ALT 12 13  ALKPHOS 53 51  BILITOT 0.5 0.6  ALBUMIN 3.3* 3.0*   Cardiac Enzymes No results found for this basename: TROPONINI, PROBNP,  in the last 168 hours Glucose  Recent Labs Lab 06/21/13 2340 06/22/13 0612 06/22/13 1250  GLUCAP 106* 128* 140*    Imaging Ct Chest W Contrast  06/22/2013   CLINICAL DATA:  Pleural effusions.  Fevers.  EXAM: CT CHEST WITH CONTRAST  TECHNIQUE: Multidetector CT imaging of the chest was performed during intravenous contrast administration.  CONTRAST:  1101mL OMNIPAQUE IOHEXOL 300 MG/ML  SOLN  COMPARISON:  No priors.  FINDINGS: Mediastinum: Heart size is mildly enlarged. No significant pericardial fluid, thickening or pericardial calcification. There is atherosclerosis of the thoracic aorta, the great vessels of the mediastinum and the coronary arteries, including calcified atherosclerotic plaque in  the left main, left anterior descending, left circumflex and right coronary arteries. Numerous borderline enlarged mediastinal and bilateral hilar lymph nodes are conspicuous by the number rather their size, but are nonspecific and likely reactive. The largest of these measures up to 9 mm in the high right paratracheal station. Esophagus is unremarkable in appearance.  Lungs/Pleura: There are multiple noncalcified and small calcified pleural plaques bilaterally. Large left pleural effusion is intermediate in attenuation, likely with proteinaceous contents. This is associated with complete atelectasis of the left lower lobe and partial atelectasis of the left upper lobe. This effusion appears potentially partially loculated, particularly in the left apex. Multifocal noncalcified pleural plaques are also noted in the right hemithorax. No definite suspicious appearing pulmonary nodules or masses are identified, although accurate assessment is limited by extensive patient respiratory motion.  Upper Abdomen: Mild thickening of the adrenal glands bilaterally.  Musculoskeletal: There are no aggressive appearing lytic or blastic lesions noted in the visualized portions of the skeleton.  IMPRESSION: 1. Large left pleural effusion which appears mildly complicated, likely partially loculated with proteinaceous contents. Sampling of the pleural fluid may be useful for diagnostic purposes, particularly to exclude the possibility of infection or underlying malignancy. 2. Multiple calcified non calcified pleural plaques bilaterally, suggesting asbestos related pleural disease. 3. Numerous borderline enlarged mediastinal and hilar lymph nodes are nonspecific and may be reactive. 4. Mild cardiomegaly. 5. Atherosclerosis, including left main and 3 vessel coronary artery disease. 6. Mild bilateral adrenal thickening, unchanged, presumably adrenal hyperplasia.   Electronically Signed   By: Vinnie Langton M.D.   On: 06/22/2013 13:52    Dg Chest Portable 1 View  06/21/2013   CLINICAL DATA:  80 year old male with fever. Initial encounter.  EXAM: PORTABLE CHEST - 1 VIEW  COMPARISON:  03/21/2012 and earlier.  FINDINGS: Seated AP view at 1904 hrs.  More lordotic view. Increased opacity at the left lung base most resembling moderate pleural effusion. Increased dense retrocardiac opacity. Stable cardiomegaly and mediastinal contours. No pneumothorax. No overt pulmonary  edema.  IMPRESSION: New or increased moderate left pleural effusion with increased lower lobe collapse or consolidation.   Electronically Signed   By: Lars Pinks M.D.   On: 06/21/2013 19:29    ASSESSMENT / PLAN:  PULMONARY A:  Acute hypoxic respiratory failure Large left pleural effusion - r/o empyema Cap  P: Mechanical ventilation STAT ETT VAP bundle Follow ABG and CXR Pancultures Urine strep/Leginemlla  Chest CT reviewed, concern empyema Left chest tube insertion--Pleural fluid study planned after ffp Abx, Vanc and zosyn 100% TV 8 cc/kg May need cvts assessment  CARDIOVASCULAR A:  Demand ischemia vs NSTEMI Hx PAF Hx of CAD and s/p stent in 1997. 100% RCA occlusion  high risk septic shock P: Trend Trops Follow EKG  Stop Coumadin Reverse INR for chest tube insertion Lactic reassuring obatin cvp resuc  RENAL A:  High risk ATN P:   Follow BMP?Mg/Phos Saline, cvp  GASTROINTESTINAL A:   Stress Px P:   Pepcid 20 IV BID npo  HEMATOLOGIC A:  INR therapeutic on coumadin  Myelodysplastic syndrome Chronic thrombocytopenia, PLT 80's VTE Px P:  FFP x 4 units and Vit K 5 IV x 1 for INR reverse Hold coumadin CBC in am SCDs  INFECTIOUS A:  CAP, r/o empyema P:   Abx as above Zosyn, vanc Add atypical as from home [facility] Sputum Chest tube studies May need vats pending evaluation  ENDOCRINE A:   T2DM   P:   SSI  NEUROLOGIC A:   Seizure disorder Hx of CVA Vent dyschrony P:   Continue Keppra Continue ASA propofol  Charlann Lange,  MD PGY-3 IMTS  06/22/2013, 2:58 PM   Ccm time 40 min   I have fully examined this patient and agree with above findings.    And edite d in full  [name]. Titus Mould, MD, [facility] P[facility]: [facility]

## 2013-06-22 NOTE — ED Provider Notes (Signed)
80 y.o. Male with fever, ow no complaints.  GF convinced him to come to ed because she thought he didn't seem himself.  Temp 103 on presentation. Ct Chest W Contrast  06/22/2013   CLINICAL DATA:  Pleural effusions.  Fevers.  EXAM: CT CHEST WITH CONTRAST  TECHNIQUE: Multidetector CT imaging of the chest was performed during intravenous contrast administration.  CONTRAST:  172mL OMNIPAQUE IOHEXOL 300 MG/ML  SOLN  COMPARISON:  No priors.  FINDINGS: Mediastinum: Heart size is mildly enlarged. No significant pericardial fluid, thickening or pericardial calcification. There is atherosclerosis of the thoracic aorta, the great vessels of the mediastinum and the coronary arteries, including calcified atherosclerotic plaque in the left main, left anterior descending, left circumflex and right coronary arteries. Numerous borderline enlarged mediastinal and bilateral hilar lymph nodes are conspicuous by the number rather their size, but are nonspecific and likely reactive. The largest of these measures up to 9 mm in the high right paratracheal station. Esophagus is unremarkable in appearance.  Lungs/Pleura: There are multiple noncalcified and small calcified pleural plaques bilaterally. Large left pleural effusion is intermediate in attenuation, likely with proteinaceous contents. This is associated with complete atelectasis of the left lower lobe and partial atelectasis of the left upper lobe. This effusion appears potentially partially loculated, particularly in the left apex. Multifocal noncalcified pleural plaques are also noted in the right hemithorax. No definite suspicious appearing pulmonary nodules or masses are identified, although accurate assessment is limited by extensive patient respiratory motion.  Upper Abdomen: Mild thickening of the adrenal glands bilaterally.  Musculoskeletal: There are no aggressive appearing lytic or blastic lesions noted in the visualized portions of the skeleton.  IMPRESSION: 1. Large  left pleural effusion which appears mildly complicated, likely partially loculated with proteinaceous contents. Sampling of the pleural fluid may be useful for diagnostic purposes, particularly to exclude the possibility of infection or underlying malignancy. 2. Multiple calcified non calcified pleural plaques bilaterally, suggesting asbestos related pleural disease. 3. Numerous borderline enlarged mediastinal and hilar lymph nodes are nonspecific and may be reactive. 4. Mild cardiomegaly. 5. Atherosclerosis, including left main and 3 vessel coronary artery disease. 6. Mild bilateral adrenal thickening, unchanged, presumably adrenal hyperplasia.   Electronically Signed   By: Vinnie Langton M.D.   On: 06/22/2013 13:52   Dg Chest Port 1 View  06/22/2013   CLINICAL DATA:  Hypoxia  EXAM: PORTABLE CHEST - 1 VIEW  COMPARISON:  Study obtained earlier in the day  FINDINGS: There is now a chest tube on the left with significantly smaller effusion on the left. Moderate effusion remains on the left. Endotracheal tube tip is 3.9 cm above the carina. Nasogastric tube tip and side port are below the diaphragm. Left jugular catheter tip is in the left innominate vein. No pneumothorax.  There is consolidation in the left base, stable. Right lung is clear. Heart is enlarged, stable. Pulmonary vascularity is normal.  IMPRESSION: Left effusion considerably smaller following chest tube placement. There is moderate effusion with left base consolidation persisting on the left side. Right lung is clear. Other tubes and catheters as described. No appreciable pneumothorax. Stable cardiomegaly.   Electronically Signed   By: Lowella Grip M.D.   On: 06/22/2013 18:56   Dg Chest Port 1 View  06/22/2013   CLINICAL DATA:  Central line placement.  EXAM: PORTABLE CHEST - 1 VIEW  COMPARISON:  Chest x-Mykeria Garman 06/21/2013.  FINDINGS: An endotracheal tube is in place with tip 3.1 cm above the carina. There is a  left-sided internal jugular  central venous catheter with tip terminating in the mid superior vena cava. Large left pleural effusion, potentially partially loculated, increased compared to recent prior examination, with extensive passive atelectasis in the left lung. Patchy interstitial opacities throughout the right lung base, concerning for sequela of aspiration or developing bronchopneumonia. No right pleural effusion. Mild enlargement of the cardiopericardial silhouette.  IMPRESSION: 1. Enlarging left pleural effusion (potentially partially loculated) with extensive passive atelectasis in the left lung. 2. Ill-defined opacities developing in the right lung base, concerning for developing infection or sequela of aspiration.   Electronically Signed   By: Vinnie Langton M.D.   On: 06/22/2013 16:21   Dg Chest Portable 1 View  06/21/2013   CLINICAL DATA:  80 year old male with fever. Initial encounter.  EXAM: PORTABLE CHEST - 1 VIEW  COMPARISON:  03/21/2012 and earlier.  FINDINGS: Seated AP view at 1904 hrs.  More lordotic view. Increased opacity at the left lung base most resembling moderate pleural effusion. Increased dense retrocardiac opacity. Stable cardiomegaly and mediastinal contours. No pneumothorax. No overt pulmonary edema.  IMPRESSION: New or increased moderate left pleural effusion with increased lower lobe collapse or consolidation.   Electronically Signed   By: Lars Pinks M.D.   On: 06/21/2013 19:29   CXR c.w. lll pneumonia.   I performed a history and physical examination of Terry Robertson and discussed his management with Ms. Upstill.  I agree with the history, physical, assessment, and plan of care, with the following exceptions: None  I was present for the following procedures: None Time Spent in Critical Care of the patient: None Time spent in discussions with the patient and family: [place], MD 06/22/13 [phone]

## 2013-06-22 NOTE — Progress Notes (Signed)
Notified Dr. Burnard Bunting of Mr. Whalley most recent INR of 2.5.

## 2013-06-22 NOTE — Progress Notes (Addendum)
CCM Interim Progress Note.  80 yo M with h/o of Afib on coumadin, admitted with resp distress with large left pleural effusion, now with left sided chest tube.  INR 2.8 --> 2.5 after 4u FFP and 5mg  IV Vit K.  Lost ~500cc blood s/p chest tube, hemodynamically stable.   Hb 7.9 --> 8.3 (7p, after chest tube insertion)  Plan: CBCs are q6h, will check additional CBC at 11p Additional Vit K 5mg  x 1 (may take 6-24h to correct INR) Will consider further FFP if Hb decreases, if becomes unstable, or if repeat chest CT is concerning   Fransisca Kaufmann, MD (PGY3, IMTS)  ADDENDUM: bed soaked with blood, IJ line oozing - order thrombipad and 2u FFP

## 2013-06-22 NOTE — H&P (Signed)
[facility] PROGRESS NOTE  Assessment/Plan: CAP (community acquired pneumonia): - Started on 4.18.2015 Rocephin and azithro. - Spiking fevers overnight. CT chest pending for loculation. - Sputum culture pending.  Seizure disorder: - continue home AED.  PAF (paroxysmal atrial fibrillation)/ Thrombocytopenia, chronic/ Myelodysplastic syndrome: - PLT's are actually improved form previous, cont to be less than 100K. Hold coumadin for now. - INR therapeutic. - Cont metoprolol and diltiazem.   CAD (coronary artery disease) PCI to circumflex in 1997.  100% occluded RCA - Currently chest pain-free  - Cont ASA and metoprolol.  DM type 2 (diabetes mellitus, type 2) - SSI, Hbg A1c pending.  Chronic diastolic heart failure: - cont metoprolol Code Status: full Family Communication: none  Disposition Plan: inpatient   Consultants:  none  Procedures:  CT chest pending  CXR  Antibiotics:  Rocephin and azithro  HPI/Subjective: Relates his SOB is somewhat better.  Objective: Filed Vitals:   06/21/13 2313 06/21/13 2324 06/22/13 0105 06/22/13 0534  BP:  167/77  166/72  Pulse:  108 97 114  Temp:  98.7 F (37.1 C)  97 F (36.1 C)  TempSrc:  Oral  Axillary  Resp:  24 18 22   Height: 5\' 5"  (1.651 m)     Weight: 113.807 kg (250 lb 14.4 oz)     SpO2:  96% 95% 90%    Intake/Output Summary (Last 24 hours) at 06/22/13 0752 Last data filed at 06/22/13 0429  Gross per 24 hour  Intake    290 ml  Output      0 ml  Net    290 ml   Filed Weights   06/21/13 1811 06/21/13 2313  Weight: 113.399 kg (250 lb) 113.807 kg (250 lb 14.4 oz)    Exam:  General: Alert, awake, oriented x3, in no acute distress.  HEENT: No bruits, no goiter. -JVD Heart: Regular rate and rhythm,  Lungs: Good air movement, clear to auscultation. Abdomen: Soft, nontender, nondistended, positive bowel sounds.    Data Reviewed: Basic Metabolic Panel:  Recent Labs Lab 06/21/13 1841  06/22/13 0500  NA 138 137  K 4.0 3.9  CL 103 102  CO2 23 21  GLUCOSE 115* 147*  BUN 12 11  CREATININE 1.11 0.98  CALCIUM 8.8 8.4   Liver Function Tests:  Recent Labs Lab 06/21/13 1841 06/22/13 0500  AST 20 21  ALT 12 13  ALKPHOS 53 51  BILITOT 0.5 0.6  PROT 9.5* 9.0*  ALBUMIN 3.3* 3.0*   No results found for this basename: LIPASE, AMYLASE,  in the last 168 hours No results found for this basename: AMMONIA,  in the last 168 hours CBC:  Recent Labs Lab 06/21/13 1841  WBC 6.5  NEUTROABS 3.1  HGB 10.8*  HCT 34.3*  MCV 85.8  PLT 87*   Cardiac Enzymes: No results found for this basename: CKTOTAL, CKMB, CKMBINDEX, TROPONINI,  in the last 168 hours BNP (last 3 results) No results found for this basename: PROBNP,  in the last 8760 hours CBG:  Recent Labs Lab 06/21/13 2340 06/22/13 0612  GLUCAP 106* 128*    No results found for this or any previous visit (from the past 240 hour(s)).   Studies: Dg Chest Portable 1 View  06/21/2013   CLINICAL DATA:  80 year old male with fever. Initial encounter.  EXAM: PORTABLE CHEST - 1 VIEW  COMPARISON:  03/21/2012 and earlier.  FINDINGS: Seated AP view at 1904 hrs.  More lordotic view. Increased opacity at the left lung base most  resembling moderate pleural effusion. Increased dense retrocardiac opacity. Stable cardiomegaly and mediastinal contours. No pneumothorax. No overt pulmonary edema.  IMPRESSION: New or increased moderate left pleural effusion with increased lower lobe collapse or consolidation.   Electronically Signed   By: Lars Pinks M.D.   On: 06/21/2013 19:29    Scheduled Meds: . antiseptic oral rinse  15 mL Mouth Rinse BID  . aspirin EC  81 mg Oral Daily  . atenolol  12.5 mg Oral Daily  . diltiazem  180 mg Oral Daily  . furosemide  20 mg Oral Q breakfast  . Icosapent Ethyl  1 g Oral Daily  . insulin aspart  0-5 Units Subcutaneous QHS  . insulin aspart  0-9 Units Subcutaneous TID WC  . levETIRAcetam  500 mg Oral  Q12H  . piperacillin-tazobactam (ZOSYN)  IV  3.375 g Intravenous 3 times per day  . simvastatin  20 mg Oral QPM  . sodium chloride  3 mL Intravenous Q12H  . terazosin  2 mg Oral QHS  . [START ON 06/23/2013] vancomycin  1,500 mg Intravenous Q24H   Continuous Infusions:    [facility] Pager [phone]. If 8PM-8AM, please contact night-coverage at [email or web address], password [facility] 06/22/2013, 7:52 AM  LOS: 1 day

## 2013-06-22 NOTE — Progress Notes (Signed)
ANTIBIOTIC CONSULT NOTE - INITIAL  Pharmacy Consult for Vancomycin  Indication: rule out pneumonia  No Known Allergies  Patient Measurements: Height: 5\' 5"  (165.1 cm) Weight: 250 lb 14.4 oz (113.807 kg) IBW/kg (Calculated) : 61.5  Vital Signs: Temp: 98.7 F (37.1 C) (04/18 2324) Temp src: Oral (04/18 2324) BP: 167/77 mmHg (04/18 2324) Pulse Rate: 108 (04/18 2324)  Labs:  Recent Labs  06/21/13 1841  WBC 6.5  HGB 10.8*  PLT 87*  CREATININE 1.11   Estimated Creatinine Clearance: 59.8 ml/min (by C-G formula based on Cr of 1.11).  Medical History: Past Medical History  Diagnosis Date  . Diabetes mellitus   . Hypertension   . Stroke 08/2011, 03/2012    L MCA (Afib RVR while admitted for hypotension) - expressive aphasia (almost totally recovered)  . Seizures     Post CVA; on Keppra  . OSA on CPAP   . Paroxysmal atrial fibrillation     on coumadin; Diagnosed at the time of his 1st CVA  . Obesity, morbid     BMI  . Heart attack 1997    inferior MI with left circumflex stent  . CAD S/P percutaneous coronary angioplasty 1997    status post remote PCI to the LAD and circumflex in 1997, heart cath in 2008 showed widely patent stents in the circumflex and LAD.  RCA had a mid lesion and then was totally occluded distally.  . Myelodysplastic syndrome     w/mild anemia an neutropenia  . BPH (benign prostatic hyperplasia)     TURP in 12/2010  . CHF (congestive heart failure)    Assessment: 80 y/o M here with fever, possible PNA on CXR, WBC wnl, renal function ok, other labs as above.   Goal of Therapy:  Vancomycin trough level 15-20 mcg/ml  Plan:  -Vancomycin 1000 mg to complete 2000 mg LOAD, then 1500 mg IV q24h -Zosyn per MD -Trend WBC, temp, renal function  -Drug levels as indicated   Narda Bonds 06/22/2013,12:07 AM

## 2013-06-22 NOTE — Procedures (Signed)
Intubation Procedure Note Terry Robertson [id number] [birth year]  Procedure: Intubation Indications: Respiratory insufficiency  Procedure Details Consent: Risks of procedure as well as the alternatives and risks of each were explained to the (patient/caregiver).  Consent for procedure obtained. Time Out: Verified patient identification, verified procedure, site/side was marked, verified correct patient position, special equipment/implants available, medications/allergies/relevent history reviewed, required imaging and test results available.  Performed  Maximum sterile technique was used including gloves, gown and hand hygiene.  MAC and 3    Evaluation Hemodynamic Status: BP stable throughout; O2 sats: stable throughout Patient's Current Condition: stable Complications: No apparent complications Patient did tolerate procedure well. Chest X-ray ordered to verify placement.  CXR: pending.   Terry Robertson 06/22/2013  gliude  [place] Titus Mould, MD, [place] Pgr: [facility]

## 2013-06-22 NOTE — Progress Notes (Addendum)
BP 151/78  Pulse 108  Temp(Src) 97 F (36.1 C) (Axillary)  Resp 28  Ht 5\' 5"  (1.651 m)  Wt 113.807 kg (250 lb 14.4 oz)  BMI 41.75 kg/m2  SpO2 90% - Was called and rhythm the patient as he is breathing about 30 times per minute on 4 L of oxygen and satting 88%, CT scan of the chest came back to show a collapsed lung with partially loculated fluid. He was satting 95% on a non-rebreather, ABG was done that shows a pH of 7.37/41/52.  - On physical exam he has poor air movement on the left with wheezing on the right breathing about 25-30 times per minute., I started him on IV Solu-Medrol give him albuterol treatments back-to-back. Now a nonrebreather satting 95%. Will try him on bipap to see if it help (unlikely) - I a have consult Critical care will transfer him to the unit. Get PT and INR. - Thoracocentesis tray at bedside.    time 2:07pm-3:010pm

## 2013-06-22 NOTE — Procedures (Signed)
Chest tube- left  Emergent and consented by pt and sons  Pre op Dx: r/o empyema, r/o hemothorax Post op : hemothorax  Prepped chloraprep left chest 4th-5th ics Lido 15 cc 1% injected 1.4 cm vert incision made Dissection to rib Punctured through space Massive high pressure bold blood out 32 fr CT placed, attempted posterior, at 12 cm Sutured into place 2 silk matress  Blood loss from procedure less 5 cc  Blood from chest 500 cc old blood  Dressed  hemodynamics tolerated well  [name]. Titus Mould, MD, [place] Pgr: [facility]

## 2013-06-22 NOTE — Progress Notes (Signed)
ANTIBIOTIC CONSULT NOTE - Follow Up  Pharmacy Consult for Vancomycin/Zosyn Indication: rule out pneumonia  No Known Allergies  Patient Measurements: Height: 5\' 5"  (165.1 cm) Weight: 250 lb 14.4 oz (113.807 kg) IBW/kg (Calculated) : 61.5  Vital Signs: Temp: 102 F (38.9 C) (04/19 1630) Temp src: Oral (04/19 1630) BP: 112/45 mmHg (04/19 1630) Pulse Rate: 86 (04/19 1630)  Labs:  Recent Labs  06/21/13 1841 06/22/13 0500  WBC 6.5  --   HGB 10.8*  --   PLT 87*  --   CREATININE 1.11 0.98   Estimated Creatinine Clearance: 67.7 ml/min (by C-G formula based on Cr of 0.98).  Medical History: Past Medical History  Diagnosis Date  . Diabetes mellitus   . Hypertension   . Stroke 08/2011, 03/2012    L MCA (Afib RVR while admitted for hypotension) - expressive aphasia (almost totally recovered)  . Seizures     Post CVA; on Keppra  . OSA on CPAP   . Paroxysmal atrial fibrillation     on coumadin; Diagnosed at the time of his 1st CVA  . Obesity, morbid     BMI  . Heart attack 1997    inferior MI with left circumflex stent  . CAD S/P percutaneous coronary angioplasty 1997    status post remote PCI to the LAD and circumflex in 1997, heart cath in 2008 showed widely patent stents in the circumflex and LAD.  RCA had a mid lesion and then was totally occluded distally.  . Myelodysplastic syndrome     w/mild anemia an neutropenia  . BPH (benign prostatic hyperplasia)     TURP in 12/2010  . CHF (congestive heart failure)    Assessment: 80 y/o M here with fever, possible PNA on CXR, WBC wnl, renal function ok, other labs as above. He was started on IV Zosyn and Vancomycin for broad spectrum coverage.  He has now been transferred to ICU for ongoing management.  Goal of Therapy:  Vancomycin trough level 15-20 mcg/ml  Plan:  - Will continue IV Vancomycin 1500 mg IV q24h - Continue IV Zosyn at 3.375gm every 8 hours. -Trend WBC, temp, renal function  -Drug levels as indicated    Rober Minion, PharmD., MS Clinical Pharmacist Pager:  [phone] Thank you for allowing pharmacy to be part of this patients care team. 06/22/2013,4:46 PM

## 2013-06-22 NOTE — Procedures (Signed)
Central Venous Catheter Insertion Procedure Note Terry Robertson [id number] 11-Oct-1928  Procedure: Insertion of Central Venous Catheter Indications: Assessment of intravascular volume, Drug and/or fluid administration and Frequent blood sampling  Procedure Details Consent: Risks of procedure as well as the alternatives and risks of each were explained to the (patient/caregiver).  Consent for procedure obtained. Time Out: Verified patient identification, verified procedure, site/side was marked, verified correct patient position, special equipment/implants available, medications/allergies/relevent history reviewed, required imaging and test results available.  Performed  Maximum sterile technique was used including antiseptics, cap, gloves, gown, hand hygiene, mask and sheet. Skin prep: Chlorhexidine; local anesthetic administered A antimicrobial bonded/coated triple lumen catheter was placed in the left internal jugular vein using the Seldinger technique.  Evaluation Blood flow good Complications: No apparent complications Patient did tolerate procedure well. Chest X-ray ordered to verify placement.  CXR: pending.  Terry Robertson 06/22/2013, 3:53 PM  [place] Unable ffp , no access for  Terry Robertson. Terry Mould, MD, [place] Pgr: [name]

## 2013-06-22 NOTE — Progress Notes (Signed)
Patient transported to and from CT on the vent with no complications.

## 2013-06-22 NOTE — Progress Notes (Signed)
I was called in room because pt. Was again extremely SOB but now using Accessory Muscles to breathe, Rapid Response nurse/Team and Respiratory Therapist on the floor came in room to assist with pt., MD on call was also in the room, pt. Placed on Non-Rebreather mask, given IV Solu-Medrol STAT per MD, pt. Also started on Cardizem due to Tachy HR, pt., pt. Was put on BiPap and transferred to ICU unit, report called to receiving RN prior to transport, pt. Stabilized prior to transport, pt. Transported with O2 and IV.

## 2013-06-22 NOTE — Progress Notes (Signed)
I entered pt.'s room to find him SOB and with his O2 not on anymore and Tachy between 120's-140's, Respirations in the 30's, I notified MD on call of pt.'s condition, MD advised to give pt.'s 10 a.m. medications now to see if his condition improves. Pt.'s HR decreased to low 1Teens and breathing improved, O2 Sats were in the low 90's on 3 L.

## 2013-06-23 ENCOUNTER — Encounter (HOSPITAL_COMMUNITY): Payer: Self-pay | Admitting: Internal Medicine

## 2013-06-23 ENCOUNTER — Inpatient Hospital Stay (HOSPITAL_COMMUNITY): Payer: Medicare HMO

## 2013-06-23 DIAGNOSIS — J189 Pneumonia, unspecified organism: Principal | ICD-10-CM

## 2013-06-23 DIAGNOSIS — E119 Type 2 diabetes mellitus without complications: Secondary | ICD-10-CM

## 2013-06-23 DIAGNOSIS — I4891 Unspecified atrial fibrillation: Secondary | ICD-10-CM

## 2013-06-23 DIAGNOSIS — J90 Pleural effusion, not elsewhere classified: Secondary | ICD-10-CM

## 2013-06-23 LAB — BLOOD GAS, ARTERIAL
Acid-base deficit: 1.4 mmol/L (ref 0.0–2.0)
Bicarbonate: 22.9 mEq/L (ref 20.0–24.0)
DRAWN BY: 40415
FIO2: 0.5 %
LHR: 18 {breaths}/min
O2 Saturation: 91.6 %
PCO2 ART: 38.7 mmHg (ref 35.0–45.0)
PEEP: 5 cmH2O
Patient temperature: 98.6
TCO2: 24.1 mmol/L (ref 0–100)
VT: 500 mL
pH, Arterial: 7.389 (ref 7.350–7.450)
pO2, Arterial: 63.6 mmHg — ABNORMAL LOW (ref 80.0–100.0)

## 2013-06-23 LAB — BASIC METABOLIC PANEL
BUN: 25 mg/dL — AB (ref 6–23)
CALCIUM: 8.3 mg/dL — AB (ref 8.4–10.5)
CO2: 22 mEq/L (ref 19–32)
Chloride: 104 mEq/L (ref 96–112)
Creatinine, Ser: 1.64 mg/dL — ABNORMAL HIGH (ref 0.50–1.35)
GFR calc non Af Amer: 37 mL/min — ABNORMAL LOW (ref >90)
GFR, EST AFRICAN AMERICAN: 43 mL/min — AB (ref >90)
GLUCOSE: 177 mg/dL — AB (ref 70–99)
Potassium: 3.6 mEq/L — ABNORMAL LOW (ref 3.7–5.3)
Sodium: 139 mEq/L (ref 137–147)

## 2013-06-23 LAB — LACTIC ACID, PLASMA
LACTIC ACID, VENOUS: 1.1 mmol/L (ref 0.5–2.2)
LACTIC ACID, VENOUS: 1.1 mmol/L (ref 0.5–2.2)
LACTIC ACID, VENOUS: 1.1 mmol/L (ref 0.5–2.2)

## 2013-06-23 LAB — CBC
HCT: 25.2 % — ABNORMAL LOW (ref 39.0–52.0)
HEMATOCRIT: 23.9 % — AB (ref 39.0–52.0)
HEMOGLOBIN: 7.3 g/dL — AB (ref 13.0–17.0)
Hemoglobin: 8 g/dL — ABNORMAL LOW (ref 13.0–17.0)
MCH: 26.1 pg (ref 26.0–34.0)
MCH: 26.9 pg (ref 26.0–34.0)
MCHC: 30.5 g/dL (ref 30.0–36.0)
MCHC: 31.7 g/dL (ref 30.0–36.0)
MCV: 85.4 fL (ref 78.0–100.0)
MCV: 84.8 fL (ref 78.0–100.0)
Platelets: 75 10*3/uL — ABNORMAL LOW (ref 150–400)
Platelets: 114 10*3/uL — ABNORMAL LOW (ref 150–400)
RBC: 2.8 MIL/uL — ABNORMAL LOW (ref 4.22–5.81)
RBC: 2.97 MIL/uL — ABNORMAL LOW (ref 4.22–5.81)
RDW: 15.4 % (ref 11.5–15.5)
RDW: 15 % (ref 11.5–15.5)
WBC: 9 10*3/uL (ref 4.0–10.5)
WBC: 14 10*3/uL — ABNORMAL HIGH (ref 4.0–10.5)

## 2013-06-23 LAB — PROTIME-INR
INR: 1.37 (ref 0.00–1.49)
PROTHROMBIN TIME: 16.5 s — AB (ref 11.6–15.2)

## 2013-06-23 LAB — URINE CULTURE: Colony Count: 60000

## 2013-06-23 LAB — PREPARE FRESH FROZEN PLASMA
UNIT DIVISION: 0
Unit division: 0
Unit division: 0

## 2013-06-23 LAB — CORTISOL: CORTISOL PLASMA: 41.8 ug/dL

## 2013-06-23 LAB — MAGNESIUM: Magnesium: 2.2 mg/dL (ref 1.5–2.5)

## 2013-06-23 LAB — LEGIONELLA ANTIGEN, URINE: Legionella Antigen, Urine: NEGATIVE

## 2013-06-23 LAB — PH, BODY FLUID: pH, Fluid: 8.5

## 2013-06-23 LAB — GLUCOSE, CAPILLARY
GLUCOSE-CAPILLARY: 128 mg/dL — AB (ref 70–99)
Glucose-Capillary: 122 mg/dL — ABNORMAL HIGH (ref 70–99)
Glucose-Capillary: 147 mg/dL — ABNORMAL HIGH (ref 70–99)
Glucose-Capillary: 162 mg/dL — ABNORMAL HIGH (ref 70–99)
Glucose-Capillary: 180 mg/dL — ABNORMAL HIGH (ref 70–99)
Glucose-Capillary: 189 mg/dL — ABNORMAL HIGH (ref 70–99)

## 2013-06-23 LAB — PREPARE RBC (CROSSMATCH)

## 2013-06-23 LAB — LACTATE DEHYDROGENASE: LDH: 215 U/L (ref 94–250)

## 2013-06-23 LAB — AMYLASE, PLEURAL FLUID: Amylase, Pleural Fluid: 187 U/L

## 2013-06-23 LAB — PHOSPHORUS: Phosphorus: 2.7 mg/dL (ref 2.3–4.6)

## 2013-06-23 LAB — MRSA PCR SCREENING: MRSA by PCR: NEGATIVE

## 2013-06-23 LAB — TROPONIN I: Troponin I: <0.3 ng/mL (ref <0.30)

## 2013-06-23 MED ORDER — VITAMIN K1 10 MG/ML IJ SOLN
5.0000 mg | Freq: Once | INTRAVENOUS | Status: AC
  Administered 2013-06-23: 5 mg via INTRAVENOUS
  Filled 2013-06-23 (×2): qty 0.5

## 2013-06-23 MED ORDER — "THROMBI-PAD 3""X3"" EX PADS"
1.0000 | MEDICATED_PAD | CUTANEOUS | Status: AC
  Administered 2013-06-23: 1 via TOPICAL
  Filled 2013-06-23: qty 1

## 2013-06-23 MED ORDER — LEVOFLOXACIN IN D5W 750 MG/150ML IV SOLN
750.0000 mg | INTRAVENOUS | Status: DC
  Filled 2013-06-23: qty 150

## 2013-06-23 MED ORDER — FENTANYL CITRATE 0.05 MG/ML IJ SOLN: 12.5000 ug | INTRAMUSCULAR | Status: DC | PRN

## 2013-06-23 MED ORDER — PRO-STAT SUGAR FREE PO LIQD
30.0000 mL | Freq: Two times a day (BID) | ORAL | Status: DC
  Filled 2013-06-23 (×2): qty 30

## 2013-06-23 MED ORDER — VITAL HIGH PROTEIN PO LIQD
1000.0000 mL | ORAL | Status: DC
  Filled 2013-06-23 (×2): qty 1000

## 2013-06-23 NOTE — Progress Notes (Signed)
PULMONARY / CRITICAL CARE MEDICINE   Name: Terry Robertson MRN: [id number] DOB: 1928-07-09    ADMISSION DATE:  06/21/2013 CONSULTATION DATE:  06/23/13  REFERRING MD : [facility]  PRIMARY SERVICE: PCCM   BRIEF PATIENT DESCRIPTION: Patient is a 80 year old man with a PMH of Stroke (08/2011, 03/2012); Seizures; PAF on coumadin; CAD S/P percutaneous coronary angioplasty (1997); chronic thrombocytopenia and Myelodysplastic syndrome, who presented with CAP initially. He was noted to have acute respiratory distress and large left pleural effusion, and was subsequently transferred to ICU for management.  SIGNIFICANT EVENTS / STUDIES:  4/18 Admit for CAP  4/19 Transfer to ICU. Acute respiratory failure and intubated. Left chest tube insertion planned  4/19 FFP x 4 and Vit K 5 mg IV x 1 for INR reverse  4/19 Chest CT s/p chest tube>>>evacuation of most of the left pleural fluid, Residual consolidation or atelectasis in both lung bases, greater on the left. Cardiac enlargement.  LINES / TUBES:  4/19 left IJ>>>  4/19 OETT>>> 4/19  OGTT>>> 4/19 Left chest tube>>>  4/19 Foley>>>  CULTURES:  4/18 blood>>>  4/18 urine>>> Neg 4/18 sputum>>>  4/18 Urine strep>>>  4/18 urine Legionella>>>  4/18 Lactic acid>>>0.6  4/19 PCT>>>1.16 4/19 Pleural fluid culture >>> WBC PRESENT, PREDOMINANTLY PMN  ANTIBIOTICS:  Vancomycin 4/18>>>  Zosyn 4/18>>>  Levaquin 4/19>>>  SUBJECTIVE Afebrile. Chest tube site stable with 340 ml out last night. continuous oozing from left IJ site. S/p a total of 6 units of FFP, Vit K 15 mg and PLT one unit. Awaiting for am labs.   VITAL SIGNS: Temp:  [96.8 F (36 C)-102 F (38.9 C)] 96.9 F (36.1 C) (04/20 0500) Pulse Rate:  [59-128] 59 (04/20 0500) Resp:  [16-28] 20 (04/20 0500) BP: (68-157)/(35-88) 101/50 mmHg (04/20 0500) SpO2:  [88 %-100 %] 98 % (04/20 0500) FiO2 (%):  [36 %-100 %] 50 % (04/20 0404) Weight:  [251 lb 15.8 oz (114.3 kg)] 251 lb 15.8 oz (114.3 kg) (04/20  0500) HEMODYNAMICS: CVP:  [9 mmHg-11 mmHg] 9 mmHg VENTILATOR SETTINGS: Vent Mode:  [-] PRVC FiO2 (%):  [36 %-100 %] 50 % Set Rate:  [18 bmp] 18 bmp Vt Set:  [500 mL] 500 mL PEEP:  [5 cmH20] 5 cmH20 Plateau Pressure:  [22 cmH20-27 cmH20] 24 cmH20 INTAKE / OUTPUT: Intake/Output     04/19 0701 - 04/20 0700   P.O. 120   I.V. (mL/kg) 504.9 (4.4)   Blood 1388   IV Piggyback 900   Total Intake(mL/kg) 2912.9 (25.5)   Urine (mL/kg/hr) 455 (0.2)   Total Output 455   Net +2457.9       Urine Occurrence 1 x   Stool Occurrence 1 x     PHYSICAL EXAMINATION: General: Intubated  Neuro: sedated on propofol HEENT: No JVD  Cardiovascular: RRR, no M/G/R  Lungs: Very diminished lung sounds. Left chest tube Abdomen: Soft. NTND. BS x 4  Musculoskeletal: PPP  Skin: intact   LABS:  CBC  Recent Labs Lab 06/22/13 1715 06/22/13 1900 06/22/13 2203  WBC 9.8 11.4* 10.4  HGB 7.9* 8.3* 8.1*  HCT 26.0* 26.9* 26.3*  PLT 71* 65* 65*   Coag's  Recent Labs Lab 06/21/13 1841 06/22/13 2145  INR 2.50* 1.43   BMET  Recent Labs Lab 06/21/13 1841 06/22/13 0500 06/22/13 1715  NA 138 137 140  K 4.0 3.9 4.1  CL 103 102 101  CO2 23 21 22   BUN 12 11 12   CREATININE 1.11 0.98 1.44*  GLUCOSE  115* 147* 189*   Electrolytes  Recent Labs Lab 06/21/13 1841 06/22/13 0500 06/22/13 1715  CALCIUM 8.8 8.4 8.4  MG  --   --  2.1  PHOS  --   --  4.2   Sepsis Markers  Recent Labs Lab 06/21/13 2034 06/22/13 1541 06/22/13 1720 06/22/13 2143  LATICACIDVEN 0.6 1.5  --  1.3  PROCALCITON  --   --  1.16  --    ABG  Recent Labs Lab 06/22/13 1400 06/22/13 1649 06/23/13 0503  PHART 7.373 7.334* 7.389  PCO2ART 41.5 43.3 38.7  PO2ART 52.2* 168.0* 63.6*   Liver Enzymes  Recent Labs Lab 06/21/13 1841 06/22/13 0500 06/22/13 1715  AST 20 21 25   ALT 12 13 17   ALKPHOS 53 51 54  BILITOT 0.5 0.6 0.6  ALBUMIN 3.3* 3.0* 3.1*   Cardiac Enzymes  Recent Labs Lab 06/22/13 1720  06/22/13 2143  TROPONINI <0.30 <0.30  PROBNP 7439.0*  --    Glucose  Recent Labs Lab 06/22/13 0612 06/22/13 1250 06/22/13 1453 06/22/13 1945 06/23/13 0020 06/23/13 0411  GLUCAP 128* 140* 144* 196* 180* 189*    Imaging Ct Chest Wo Contrast  06/23/2013   CLINICAL DATA:  Empyema with chest tube.  EXAM: CT CHEST WITHOUT CONTRAST  TECHNIQUE: Multidetector CT imaging of the chest was performed following the standard protocol without IV contrast.  COMPARISON:  DG CHEST 1V PORT dated 06/22/2013; CT CHEST W/CM dated 06/22/2013  FINDINGS: Since the prior study, there is been interval placement of a left chest tube with significant evacuation of previous left pleural effusion. There is residual consolidation and atelectasis in the left lung. Mild subcutaneous emphysema in the left chest. Endotracheal and enteric tubes are in place. Left central venous catheter with tip in the brachiocephalic vein. Cardiac enlargement with Coronary artery calcifications. Calcification of thoracic aorta without aneurysm. Scattered mediastinal lymph nodes are not pathologically enlarged and are likely reactive, similar to prior study. Visualization of lung fields is limited due to respiratory motion artifact. Atelectasis or consolidation on the right lung base. Intramuscular lipoma versus elastofibroma in the right posterior chest wall. No destructive bone lesions.  IMPRESSION: Interval placement of left chest tube with evacuation of most of the left pleural fluid seen previously. Residual consolidation or atelectasis in both lung bases, greater on the left. Cardiac enlargement.   Electronically Signed   By: Lucienne Capers M.D.   On: 06/23/2013 00:57   Ct Chest W Contrast  06/22/2013   CLINICAL DATA:  Pleural effusions.  Fevers.  EXAM: CT CHEST WITH CONTRAST  TECHNIQUE: Multidetector CT imaging of the chest was performed during intravenous contrast administration.  CONTRAST:  152mL OMNIPAQUE IOHEXOL 300 MG/ML  SOLN   COMPARISON:  No priors.  FINDINGS: Mediastinum: Heart size is mildly enlarged. No significant pericardial fluid, thickening or pericardial calcification. There is atherosclerosis of the thoracic aorta, the great vessels of the mediastinum and the coronary arteries, including calcified atherosclerotic plaque in the left main, left anterior descending, left circumflex and right coronary arteries. Numerous borderline enlarged mediastinal and bilateral hilar lymph nodes are conspicuous by the number rather their size, but are nonspecific and likely reactive. The largest of these measures up to 9 mm in the high right paratracheal station. Esophagus is unremarkable in appearance.  Lungs/Pleura: There are multiple noncalcified and small calcified pleural plaques bilaterally. Large left pleural effusion is intermediate in attenuation, likely with proteinaceous contents. This is associated with complete atelectasis of the left lower lobe and partial atelectasis  of the left upper lobe. This effusion appears potentially partially loculated, particularly in the left apex. Multifocal noncalcified pleural plaques are also noted in the right hemithorax. No definite suspicious appearing pulmonary nodules or masses are identified, although accurate assessment is limited by extensive patient respiratory motion.  Upper Abdomen: Mild thickening of the adrenal glands bilaterally.  Musculoskeletal: There are no aggressive appearing lytic or blastic lesions noted in the visualized portions of the skeleton.  IMPRESSION: 1. Large left pleural effusion which appears mildly complicated, likely partially loculated with proteinaceous contents. Sampling of the pleural fluid may be useful for diagnostic purposes, particularly to exclude the possibility of infection or underlying malignancy. 2. Multiple calcified non calcified pleural plaques bilaterally, suggesting asbestos related pleural disease. 3. Numerous borderline enlarged mediastinal and  hilar lymph nodes are nonspecific and may be reactive. 4. Mild cardiomegaly. 5. Atherosclerosis, including left main and 3 vessel coronary artery disease. 6. Mild bilateral adrenal thickening, unchanged, presumably adrenal hyperplasia.   Electronically Signed   By: Vinnie Langton M.D.   On: 06/22/2013 13:52   Dg Chest Port 1 View  06/22/2013   CLINICAL DATA:  Hypoxia  EXAM: PORTABLE CHEST - 1 VIEW  COMPARISON:  Study obtained earlier in the day  FINDINGS: There is now a chest tube on the left with significantly smaller effusion on the left. Moderate effusion remains on the left. Endotracheal tube tip is 3.9 cm above the carina. Nasogastric tube tip and side port are below the diaphragm. Left jugular catheter tip is in the left innominate vein. No pneumothorax.  There is consolidation in the left base, stable. Right lung is clear. Heart is enlarged, stable. Pulmonary vascularity is normal.  IMPRESSION: Left effusion considerably smaller following chest tube placement. There is moderate effusion with left base consolidation persisting on the left side. Right lung is clear. Other tubes and catheters as described. No appreciable pneumothorax. Stable cardiomegaly.   Electronically Signed   By: Lowella Grip M.D.   On: 06/22/2013 18:56   Dg Chest Port 1 View  06/22/2013   CLINICAL DATA:  Central line placement.  EXAM: PORTABLE CHEST - 1 VIEW  COMPARISON:  Chest x-ray 06/21/2013.  FINDINGS: An endotracheal tube is in place with tip 3.1 cm above the carina. There is a left-sided internal jugular central venous catheter with tip terminating in the mid superior vena cava. Large left pleural effusion, potentially partially loculated, increased compared to recent prior examination, with extensive passive atelectasis in the left lung. Patchy interstitial opacities throughout the right lung base, concerning for sequela of aspiration or developing bronchopneumonia. No right pleural effusion. Mild enlargement of the  cardiopericardial silhouette.  IMPRESSION: 1. Enlarging left pleural effusion (potentially partially loculated) with extensive passive atelectasis in the left lung. 2. Ill-defined opacities developing in the right lung base, concerning for developing infection or sequela of aspiration.   Electronically Signed   By: Vinnie Langton M.D.   On: 06/22/2013 16:21   Dg Chest Portable 1 View  06/21/2013   CLINICAL DATA:  80 year old male with fever. Initial encounter.  EXAM: PORTABLE CHEST - 1 VIEW  COMPARISON:  03/21/2012 and earlier.  FINDINGS: Seated AP view at 1904 hrs.  More lordotic view. Increased opacity at the left lung base most resembling moderate pleural effusion. Increased dense retrocardiac opacity. Stable cardiomegaly and mediastinal contours. No pneumothorax. No overt pulmonary edema.  IMPRESSION: New or increased moderate left pleural effusion with increased lower lobe collapse or consolidation.   Electronically Signed   By:  Lars Pinks M.D.   On: 06/21/2013 19:29   ASSESSMENT / PLAN:  PULMONARY  A:  Acute hypoxic respiratory failure > improved greatly Large left pleural effusion > hemothorax, uncertain etiology > underlying malignancy (evidence of asbestos exposure)? Doubt infectious cause  CAP P:  Extubate today May need cvts assessment > not a good operative candidate, will need to discuss more with HCPOA   CARDIOVASCULAR  A:  Demand ischemia, resolved Hx PAF  Hx of CAD and s/p stent in 1997. 100% RCA occlusion  P:  Trend Trops>>>Neg  Follow EKG  Stop Coumadin and INR reversed    RENAL  A:  AKI Cr 1.64>>>? Prerenal vs ATN Hx of hematuria managed by her Urologist Dr. Harlow Asa  P: MAP > 65  Follow BMP/Mg/Phos  Saline Will obtain medical records from Dr. Harlow Asa office.   GASTROINTESTINAL  A:  Stress Px  P:  Pepcid 20 IV BID  NPO TF  HEMATOLOGIC  A:  Acute blood loss anemia, HGB 7.3.  INR therapeutic on coumadin, reversed  Myelodysplastic syndrome   Chronic thrombocytopenia, PLT 80's  VTE Px  P:  Hold coumadin  Trend CBC Q6 INR  Goal HGB 8 in the setting of demand ischemia and hx extensive CAD PRBC tx x 1 today SCDs   INFECTIOUS  A: CAP, parapneumonic effusion? P:  Abx as above F/U pleural fluid study   May need vats pending evaluation   ENDOCRINE  A:  T2DM  P:  SSI   NEUROLOGIC  A:  Seizure disorder  Hx of CVA  Vent dyschrony  P:  Continue Keppra  Continue ASA  propofol   Charlann Lange, MD PGY-3  [facility]  06/23/2013, 6:28 AM  Attending:  I have seen and examined the patient with nurse practitioner/resident and agree with the note above.   Large bloody effusion in setting of chronic thrombocytopenia and anticoagulation; Pleural thickening seen on both studies due to asbestos exposure, I think that he may have a pleural malignancy as there is no clear infectious cause  Will treat for CAP  Ideally would want a VATS, but from talking to his girlfriend it sounds like he would not be a great operative candidate (house bound for years).  Will need to discuss more with HCPOA before consulting thoracic surgery  Extubate today  CC time 40 minutes  Roselie Awkward, MD [place] [facility] Pager: [phone] Cell: [phone] If no response, call [phone]

## 2013-06-23 NOTE — Progress Notes (Signed)
Called to bedside for oozing from central line.  Mattress stitch placed at insertion site, thrombi pad and pressure dressing applied to achieve hemostasis.  Attempted >10 min pressure to site without cessation.  Will continue to monitor.    Noe Gens, NP-C [facility] Pgr: [phone] or [phone]

## 2013-06-23 NOTE — Progress Notes (Signed)
CCM Interval Progress Note.  Called to bedside by RN - IJ site with persistent bleeding despite thrombipad.  Patient is s/p 6u FFP and 10mg  IV Vit K. INR 1.43, Platelets 65  Discussed with Dr. Joya Gaskins - will treat with 1 unit platelets and another 5mg  IV Vit K  Fransisca Kaufmann, MD ([facility], PGY 3)

## 2013-06-23 NOTE — Progress Notes (Addendum)
INITIAL NUTRITION ASSESSMENT  DOCUMENTATION CODES Per approved criteria  -Morbid Obesity   INTERVENTION:  If unable to extubate patient today, utilize 38M PEPuP Protocol: initiate TF via OGT with Vital High Protein at 25 ml/h on day 1; on day 2, increase to goal rate of 65 ml/h (1560 ml per day) to provide 1560 kcals, 137 gm protein, 1304 ml free water daily.  RD to follow for ability to advance diet post-extubation, adequacy of oral intake, need for supplements.  NUTRITION DIAGNOSIS: Inadequate oral intake related to inability to eat as evidenced by NPO status.   Goal: Intake to meet >90% of estimated nutrition needs once extubated.  Monitor:  Diet advancement after extubation, PO intake, labs, weight trend.  Reason for Assessment: MD Consult for TF initiation and management.  80 y.o. male  Admitting Dx: Acute respiratory failure  ASSESSMENT: Patient is a 80 year old man with a PMH of Stroke (08/2011, 03/2012); Seizures; PAF on coumadin; CAD S/P percutaneous coronary angioplasty (1997); chronic thrombocytopenia and Myelodysplastic syndrome, who presented with CAP initially. He was noted to have acute respiratory distress and large left pleural effusion, and was subsequently transferred to ICU and intubated on 4/19.  Nutrition focused physical exam completed.  No muscle or subcutaneous fat depletion noticed. No nutrition problems identified PTA except morbid obesity. Per discussion in ICU rounds, plans to extubate patient today; no TF at this time.  Patient is currently intubated on ventilator support MV: 11.1 L/min Temp (24hrs), Avg:97.5 F (36.4 C), Min:96.7 F (35.9 C), Max:102 F (38.9 C)   Height: Ht Readings from Last 1 Encounters:  06/21/13 5\' 5"  (1.651 m)    Weight: Wt Readings from Last 1 Encounters:  06/23/13 251 lb 15.8 oz (114.3 kg)    Ideal Body Weight: 56.8 kg  % Ideal Body Weight: 201%  Wt Readings from Last 10 Encounters:  06/23/13 251 lb 15.8 oz  (114.3 kg)  05/14/13 247 lb 4.8 oz (112.175 kg)  03/07/13 258 lb 8 oz (117.255 kg)  02/19/13 257 lb (116.574 kg)  02/12/13 255 lb (115.667 kg)  08/20/12 256 lb (116.121 kg)  07/31/12 256 lb (116.121 kg)  03/29/12 232 lb (105.235 kg)  03/25/12 247 lb 1.6 oz (112.084 kg)  01/02/12 239 lb 14.4 oz (108.818 kg)    Usual Body Weight: 247-258 lb  % Usual Body Weight: 100%  BMI:  Body mass index is 41.93 kg/(m^2). class 3, extreme/morbid obesity  Estimated Nutritional Needs: Kcal: 2192 Protein: 114-142 gm Fluid: 2.2 L  Skin: no wounds  Diet Order: NPO  EDUCATION NEEDS: -Education not appropriate at this time   Intake/Output Summary (Last 24 hours) at 06/23/13 1113 Last data filed at 06/23/13 1019  Gross per 24 hour  Intake 3297.38 ml  Output    860 ml  Net 2437.38 ml    Last BM: 4/17   Labs:   Recent Labs Lab 06/22/13 0500 06/22/13 1715 06/23/13 0835  NA 137 140 139  K 3.9 4.1 3.6*  CL 102 101 104  CO2 21 22 22   BUN 11 12 25*  CREATININE 0.98 1.44* 1.64*  CALCIUM 8.4 8.4 8.3*  MG  --  2.1 2.2  PHOS  --  4.2 2.7  GLUCOSE 147* 189* 177*    CBG (last 3)   Recent Labs  06/23/13 0020 06/23/13 0411 06/23/13 0838  GLUCAP 180* 189* 162*    Scheduled Meds: . antiseptic oral rinse  15 mL Mouth Rinse BID  . aspirin EC  81  mg Oral Daily  . famotidine (PEPCID) IV  20 mg Intravenous Q12H  . insulin aspart  1-3 Units Subcutaneous 6 times per day  . levETIRAcetam  500 mg Intravenous Q12H  . levofloxacin (LEVAQUIN) IV  750 mg Intravenous Q24H  . methylPREDNISolone (SOLU-MEDROL) injection  125 mg Intravenous Q8H  . piperacillin-tazobactam (ZOSYN)  IV  3.375 g Intravenous 3 times per day  . sodium chloride  3 mL Intravenous Q12H  . vancomycin  1,500 mg Intravenous Q24H    Continuous Infusions: . diltiazem (CARDIZEM) infusion Stopped (06/22/13 1545)  . propofol Stopped (06/23/13 1023)    Past Medical History  Diagnosis Date  . Diabetes mellitus   .  Hypertension   . Stroke 08/2011, 03/2012    L MCA (Afib RVR while admitted for hypotension) - expressive aphasia (almost totally recovered)  . Seizures     Post CVA; on Keppra  . OSA on CPAP   . Paroxysmal atrial fibrillation     on coumadin; Diagnosed at the time of his 1st CVA  . Obesity, morbid     BMI  . Heart attack 1997    inferior MI with left circumflex stent  . CAD S/P percutaneous coronary angioplasty 1997    status post remote PCI to the LAD and circumflex in 1997, heart cath in 2008 showed widely patent stents in the circumflex and LAD.  RCA had a mid lesion and then was totally occluded distally.  . Myelodysplastic syndrome     w/mild anemia an neutropenia  . BPH (benign prostatic hyperplasia)     TURP in 12/2010  . CHF (congestive heart failure)   . Asbestosis     family unclear where he was evaluated in the past    Past Surgical History  Procedure Laterality Date  . Transthoracic echocardiogram  08/06/11    EF 55% to 65% with grade2 diastolic dysfunction/pseudonormal with mildly dilated left atrium, had evidence of elevated filling ressures in the left vientricle and left atrium  . Cardiac catheterization  02/08/07    widely patent stent in the circumflex and LAD.  RCA had a mid lesion and then was totally occluded distally. Cook bare-metal 3.5x20 mm stent  . Transurethral resection of prostate  12/2010  . Nm myoview ltd  01/09/1999    Inferior wall thinning with possible mild ischemia versus infarct. This would correlate well with his known RCA occlusion    Molli Barrows, RD, LDN, [place] Pager [phone] After Hours Pager [phone]

## 2013-06-23 NOTE — Procedures (Signed)
Extubation Procedure Note  Patient Details:   Name: Terry Robertson DOB: 1928-05-19 MRN: [id number]   Airway Documentation:  AIRWAYS (Active)   + air leak around cuff prior to extubation.   Evaluation  O2 sats: stable throughout Complications: No apparent complications Patient did tolerate procedure well. Bilateral Breath Sounds: Clear;Diminished Suctioning: Airway;Oral Yes, pt able to speak.  NO distress noted, no stridor noted.  Pt denies SOB.    Karolee Ohs 06/23/2013, 12:30 PM

## 2013-06-23 NOTE — Progress Notes (Signed)
UR Completed.  Terry Robertson [name] [phone] 06/23/2013  

## 2013-06-24 ENCOUNTER — Inpatient Hospital Stay (HOSPITAL_COMMUNITY): Payer: Medicare HMO

## 2013-06-24 DIAGNOSIS — Z7901 Long term (current) use of anticoagulants: Secondary | ICD-10-CM

## 2013-06-24 DIAGNOSIS — J90 Pleural effusion, not elsewhere classified: Secondary | ICD-10-CM

## 2013-06-24 LAB — PREPARE FRESH FROZEN PLASMA
UNIT DIVISION: 0
Unit division: 0

## 2013-06-24 LAB — CBC
HCT: 24.7 % — ABNORMAL LOW (ref 39.0–52.0)
HEMATOCRIT: 24.2 % — AB (ref 39.0–52.0)
Hemoglobin: 7.8 g/dL — ABNORMAL LOW (ref 13.0–17.0)
Hemoglobin: 7.7 g/dL — ABNORMAL LOW (ref 13.0–17.0)
MCH: 26.6 pg (ref 26.0–34.0)
MCH: 26.9 pg (ref 26.0–34.0)
MCHC: 31.6 g/dL (ref 30.0–36.0)
MCHC: 31.8 g/dL (ref 30.0–36.0)
MCV: 84.3 fL (ref 78.0–100.0)
MCV: 84.6 fL (ref 78.0–100.0)
PLATELETS: 109 10*3/uL — AB (ref 150–400)
Platelets: 111 10*3/uL — ABNORMAL LOW (ref 150–400)
RBC: 2.93 MIL/uL — ABNORMAL LOW (ref 4.22–5.81)
RBC: 2.86 MIL/uL — AB (ref 4.22–5.81)
RDW: 15.2 % (ref 11.5–15.5)
RDW: 15.2 % (ref 11.5–15.5)
WBC: 12.9 10*3/uL — ABNORMAL HIGH (ref 4.0–10.5)
WBC: 12 10*3/uL — ABNORMAL HIGH (ref 4.0–10.5)

## 2013-06-24 LAB — GLUCOSE, CAPILLARY
GLUCOSE-CAPILLARY: 131 mg/dL — AB (ref 70–99)
Glucose-Capillary: 139 mg/dL — ABNORMAL HIGH (ref 70–99)
Glucose-Capillary: 158 mg/dL — ABNORMAL HIGH (ref 70–99)
Glucose-Capillary: 179 mg/dL — ABNORMAL HIGH (ref 70–99)
Glucose-Capillary: 162 mg/dL — ABNORMAL HIGH (ref 70–99)
Glucose-Capillary: 166 mg/dL — ABNORMAL HIGH (ref 70–99)

## 2013-06-24 LAB — BLOOD GAS, ARTERIAL
Acid-base deficit: 0.3 mmol/L (ref 0.0–2.0)
Bicarbonate: 23.5 mEq/L (ref 20.0–24.0)
DRAWN BY: 252031
O2 CONTENT: 4 L/min
O2 Saturation: 95.6 %
PCO2 ART: 36.2 mmHg (ref 35.0–45.0)
PO2 ART: 76.8 mmHg — AB (ref 80.0–100.0)
Patient temperature: 98.2
TCO2: 24.7 mmol/L (ref 0–100)
pH, Arterial: 7.427 (ref 7.350–7.450)

## 2013-06-24 LAB — BASIC METABOLIC PANEL
BUN: 26 mg/dL — AB (ref 6–23)
CALCIUM: 8.4 mg/dL (ref 8.4–10.5)
CO2: 23 mEq/L (ref 19–32)
Chloride: 108 mEq/L (ref 96–112)
Creatinine, Ser: 1.16 mg/dL (ref 0.50–1.35)
GFR calc non Af Amer: 57 mL/min — ABNORMAL LOW (ref >90)
GFR, EST AFRICAN AMERICAN: 66 mL/min — AB (ref >90)
GLUCOSE: 150 mg/dL — AB (ref 70–99)
POTASSIUM: 3.6 meq/L — AB (ref 3.7–5.3)
Sodium: 145 mEq/L (ref 137–147)

## 2013-06-24 LAB — TYPE AND SCREEN
ABO/RH(D): O POS
ANTIBODY SCREEN: NEGATIVE
Unit division: 0

## 2013-06-24 LAB — PREPARE PLATELET PHERESIS
UNIT DIVISION: 0
Unit division: 0

## 2013-06-24 MED ORDER — LEVETIRACETAM 500 MG PO TABS
500.0000 mg | ORAL_TABLET | Freq: Two times a day (BID) | ORAL | Status: DC
  Administered 2013-06-24 – 2013-06-28 (×9): 500 mg via ORAL
  Filled 2013-06-24 (×11): qty 1

## 2013-06-24 MED ORDER — METHYLPREDNISOLONE SODIUM SUCC 125 MG IJ SOLR
80.0000 mg | Freq: Two times a day (BID) | INTRAMUSCULAR | Status: DC
  Administered 2013-06-24 – 2013-06-25 (×2): 80 mg via INTRAVENOUS
  Filled 2013-06-24 (×4): qty 1.28

## 2013-06-24 MED ORDER — HYDROCODONE-ACETAMINOPHEN 5-325 MG PO TABS
1.0000 | ORAL_TABLET | Freq: Four times a day (QID) | ORAL | Status: DC | PRN
  Administered 2013-06-24: 1 via ORAL
  Filled 2013-06-24: qty 1

## 2013-06-24 MED ORDER — POTASSIUM CHLORIDE CRYS ER 20 MEQ PO TBCR
40.0000 meq | EXTENDED_RELEASE_TABLET | Freq: Once | ORAL | Status: AC
  Administered 2013-06-24: 40 meq via ORAL
  Filled 2013-06-24: qty 2

## 2013-06-24 MED ORDER — DILTIAZEM HCL ER COATED BEADS 180 MG PO CP24
180.0000 mg | ORAL_CAPSULE | Freq: Every day | ORAL | Status: DC
  Administered 2013-06-24 – 2013-06-28 (×5): 180 mg via ORAL
  Filled 2013-06-24 (×6): qty 1

## 2013-06-24 MED ORDER — LEVOFLOXACIN 750 MG PO TABS
750.0000 mg | ORAL_TABLET | Freq: Every day | ORAL | Status: DC
  Administered 2013-06-24 – 2013-06-27 (×4): 750 mg via ORAL
  Filled 2013-06-24 (×6): qty 1

## 2013-06-24 NOTE — Progress Notes (Signed)
PULMONARY / CRITICAL CARE MEDICINE   Name: Terry Robertson MRN: [id number] DOB: [birth year]    ADMISSION DATE:  06/21/2013 CONSULTATION DATE:  06/24/13  REFERRING MD : [facility]  PRIMARY SERVICE: PCCM  BRIEF PATIENT DESCRIPTION: Patient is a 80 year old man with a PMH of Stroke (08/2011, 03/2012); Seizures; PAF on coumadin; CAD S/P percutaneous coronary angioplasty (1997); chronic thrombocytopenia and Myelodysplastic syndrome, who presented with CAP initially. He was noted to have acute respiratory distress and large left pleural effusion, and was subsequently transferred to ICU for management.   SIGNIFICANT EVENTS / STUDIES:  4/18 Admit for CAP  4/19 Transfer to ICU. Acute respiratory failure and intubated. Left chest tube insertion planned  4/19 FFP x 4 and Vit K 5 mg IV x 1 for INR reverse  4/19 Chest CT s/p chest tube>>>evacuation of most of the left pleural fluid, Residual consolidation or atelectasis in both lung bases, greater on the left. Cardiac enlargement.  4/20 CVL continuous oozing. PLT one unit given. PRBC x 1 unit 4/20 extubated  LINES / TUBES:  4/19 left IJ>>>  4/19 OETT>>>4/20  4/19 OGTT>>> 4/20 4/19 Left chest tube>>>  4/19 Foley>>>   CULTURES:  4/18 blood>>> GPR 1/2 4/18 urine>>> Neg  4/18 sputum>>>  4/18 Urine strep>>> Neg 4/18 urine Legionella>>>Neg  4/18 Lactic acid>>>0.6  4/19 PCT>>>1.16  4/19 Pleural fluid culture >>> WBC PRESENT, PREDOMINANTLY PMN   ANTIBIOTICS:  Vancomycin 4/18>>> 4/21 Zosyn 4/18>>> 4/21 Levaquin 4/19>>>   SUBJECTIVE Extubated yesterday and tolerated well.   VITAL SIGNS: Temp:  [96.8 F (36 C)-98.9 F (37.2 C)] 98.2 F (36.8 C) (04/21 0331) Pulse Rate:  [55-92] 86 (04/21 0600) Resp:  [18-34] 20 (04/21 0600) BP: (96-156)/(47-92) 139/75 mmHg (04/21 0600) SpO2:  [90 %-100 %] 98 % (04/21 0600) FiO2 (%):  [40 %] 40 % (04/20 1200) Weight:  [247 lb 5.7 oz (112.2 kg)] 247 lb 5.7 oz (112.2 kg) (04/21 0500) HEMODYNAMICS: CVP:  [4 mmHg-8  mmHg] 4 mmHg VENTILATOR SETTINGS: Vent Mode:  [-] PSV;CPAP FiO2 (%):  [40 %] 40 % PEEP:  [5 cmH20] 5 cmH20 Pressure Support:  [5 cmH20-10 cmH20] 5 cmH20 INTAKE / OUTPUT: Intake/Output     04/20 0701 - 04/21 0700   I.V. (mL/kg) 78 (0.7)   Blood 612.5   IV Piggyback 922.5   Total Intake(mL/kg) 1613 (14.4)   Urine (mL/kg/hr) 930 (0.3)   Chest Tube 205 (0.1)   Total Output 1135   Net +478         PHYSICAL EXAMINATION: General: NAD. A+O x3 HEENT: No JVD  Cardiovascular: RRR, no M/G/R  Lungs: LLL diminished but better air movement. Left chest tube  Abdomen: Soft. NTND. BS x 4  Musculoskeletal: PPP  Skin: intact  LABS:  CBC  Recent Labs Lab 06/23/13 1830 06/24/13 0102 06/24/13 0613  WBC 14.0* 12.9* PENDING  HGB 8.0* 7.8* 7.7*  HCT 25.2* 24.7* 24.2*  PLT 114* 109* PENDING   Coag's  Recent Labs Lab 06/22/13 1900 06/22/13 2145 06/23/13 0835  APTT 49*  --   --   INR 1.48 1.43 1.37   BMET  Recent Labs Lab 06/22/13 1715 06/23/13 0835 06/24/13 0500  NA 140 139 145  K 4.1 3.6* 3.6*  CL 101 104 108  CO2 22 22 23   BUN 12 25* 26*  CREATININE 1.44* 1.64* 1.16  GLUCOSE 189* 177* 150*   Electrolytes  Recent Labs Lab 06/22/13 1715 06/23/13 0835 06/24/13 0500  CALCIUM 8.4 8.3* 8.4  MG 2.1 2.2  --  PHOS 4.2 2.7  --    Sepsis Markers  Recent Labs Lab 06/22/13 1720  06/23/13 0835 06/23/13 1830 06/23/13 2125  LATICACIDVEN  --   < > 1.1 1.1 1.1  PROCALCITON 1.16  --   --   --   --   < > = values in this interval not displayed. ABG  Recent Labs Lab 06/22/13 1649 06/23/13 0503 06/24/13 0325  PHART 7.334* 7.389 7.427  PCO2ART 43.3 38.7 36.2  PO2ART 168.0* 63.6* 76.8*   Liver Enzymes  Recent Labs Lab 06/21/13 1841 06/22/13 0500 06/22/13 1715  AST 20 21 25   ALT 12 13 17   ALKPHOS 53 51 54  BILITOT 0.5 0.6 0.6  ALBUMIN 3.3* 3.0* 3.1*   Cardiac Enzymes  Recent Labs Lab 06/22/13 1720 06/22/13 2143 06/23/13 0835  TROPONINI <0.30  <0.30 <0.30  PROBNP 7439.0*  --   --    Glucose  Recent Labs Lab 06/23/13 0838 06/23/13 1133 06/23/13 1558 06/23/13 1922 06/24/13 0016 06/24/13 0331  GLUCAP 162* 147* 128* 122* 131* 139*    Imaging Ct Chest Wo Contrast  06/23/2013   CLINICAL DATA:  Empyema with chest tube.  EXAM: CT CHEST WITHOUT CONTRAST  TECHNIQUE: Multidetector CT imaging of the chest was performed following the standard protocol without IV contrast.  COMPARISON:  DG CHEST 1V PORT dated 06/22/2013; CT CHEST W/CM dated 06/22/2013  FINDINGS: Since the prior study, there is been interval placement of a left chest tube with significant evacuation of previous left pleural effusion. There is residual consolidation and atelectasis in the left lung. Mild subcutaneous emphysema in the left chest. Endotracheal and enteric tubes are in place. Left central venous catheter with tip in the brachiocephalic vein. Cardiac enlargement with Coronary artery calcifications. Calcification of thoracic aorta without aneurysm. Scattered mediastinal lymph nodes are not pathologically enlarged and are likely reactive, similar to prior study. Visualization of lung fields is limited due to respiratory motion artifact. Atelectasis or consolidation on the right lung base. Intramuscular lipoma versus elastofibroma in the right posterior chest wall. No destructive bone lesions.  IMPRESSION: Interval placement of left chest tube with evacuation of most of the left pleural fluid seen previously. Residual consolidation or atelectasis in both lung bases, greater on the left. Cardiac enlargement.   Electronically Signed   By: Lucienne Capers M.D.   On: 06/23/2013 00:57   Ct Chest W Contrast  06/22/2013   CLINICAL DATA:  Pleural effusions.  Fevers.  EXAM: CT CHEST WITH CONTRAST  TECHNIQUE: Multidetector CT imaging of the chest was performed during intravenous contrast administration.  CONTRAST:  136mL OMNIPAQUE IOHEXOL 300 MG/ML  SOLN  COMPARISON:  No priors.   FINDINGS: Mediastinum: Heart size is mildly enlarged. No significant pericardial fluid, thickening or pericardial calcification. There is atherosclerosis of the thoracic aorta, the great vessels of the mediastinum and the coronary arteries, including calcified atherosclerotic plaque in the left main, left anterior descending, left circumflex and right coronary arteries. Numerous borderline enlarged mediastinal and bilateral hilar lymph nodes are conspicuous by the number rather their size, but are nonspecific and likely reactive. The largest of these measures up to 9 mm in the high right paratracheal station. Esophagus is unremarkable in appearance.  Lungs/Pleura: There are multiple noncalcified and small calcified pleural plaques bilaterally. Large left pleural effusion is intermediate in attenuation, likely with proteinaceous contents. This is associated with complete atelectasis of the left lower lobe and partial atelectasis of the left upper lobe. This effusion appears potentially partially loculated, particularly  in the left apex. Multifocal noncalcified pleural plaques are also noted in the right hemithorax. No definite suspicious appearing pulmonary nodules or masses are identified, although accurate assessment is limited by extensive patient respiratory motion.  Upper Abdomen: Mild thickening of the adrenal glands bilaterally.  Musculoskeletal: There are no aggressive appearing lytic or blastic lesions noted in the visualized portions of the skeleton.  IMPRESSION: 1. Large left pleural effusion which appears mildly complicated, likely partially loculated with proteinaceous contents. Sampling of the pleural fluid may be useful for diagnostic purposes, particularly to exclude the possibility of infection or underlying malignancy. 2. Multiple calcified non calcified pleural plaques bilaterally, suggesting asbestos related pleural disease. 3. Numerous borderline enlarged mediastinal and hilar lymph nodes are  nonspecific and may be reactive. 4. Mild cardiomegaly. 5. Atherosclerosis, including left main and 3 vessel coronary artery disease. 6. Mild bilateral adrenal thickening, unchanged, presumably adrenal hyperplasia.   Electronically Signed   By: Vinnie Langton M.D.   On: 06/22/2013 13:52   Dg Chest Port 1 View  06/23/2013   CLINICAL DATA:  Chest tube placement  EXAM: PORTABLE CHEST - 1 VIEW  COMPARISON:  Four hundred nineteen  FINDINGS: Endotracheal tube has its tip 4 cm above the carina. Nasogastric tube enters the stomach. Left chest tube remains well-positioned. The right hemi thorax is clear. Moderate amount of pleural density persists on the left with volume loss or infiltrate in the left lower lobe. Left internal jugular central line has its tip at the innominate SVC junction. No pleural air.  IMPRESSION: No change since yesterday. Lines and tubes well positioned. Persistent pleural density on the left with left lower lobe atelectasis/ infiltrate.   Electronically Signed   By: Nelson Chimes M.D.   On: 06/23/2013 07:06   Dg Chest Port 1 View  06/22/2013   CLINICAL DATA:  Hypoxia  EXAM: PORTABLE CHEST - 1 VIEW  COMPARISON:  Study obtained earlier in the day  FINDINGS: There is now a chest tube on the left with significantly smaller effusion on the left. Moderate effusion remains on the left. Endotracheal tube tip is 3.9 cm above the carina. Nasogastric tube tip and side port are below the diaphragm. Left jugular catheter tip is in the left innominate vein. No pneumothorax.  There is consolidation in the left base, stable. Right lung is clear. Heart is enlarged, stable. Pulmonary vascularity is normal.  IMPRESSION: Left effusion considerably smaller following chest tube placement. There is moderate effusion with left base consolidation persisting on the left side. Right lung is clear. Other tubes and catheters as described. No appreciable pneumothorax. Stable cardiomegaly.   Electronically Signed   By:  Lowella Grip M.D.   On: 06/22/2013 18:56   Dg Chest Port 1 View  06/22/2013   CLINICAL DATA:  Central line placement.  EXAM: PORTABLE CHEST - 1 VIEW  COMPARISON:  Chest x-ray 06/21/2013.  FINDINGS: An endotracheal tube is in place with tip 3.1 cm above the carina. There is a left-sided internal jugular central venous catheter with tip terminating in the mid superior vena cava. Large left pleural effusion, potentially partially loculated, increased compared to recent prior examination, with extensive passive atelectasis in the left lung. Patchy interstitial opacities throughout the right lung base, concerning for sequela of aspiration or developing bronchopneumonia. No right pleural effusion. Mild enlargement of the cardiopericardial silhouette.  IMPRESSION: 1. Enlarging left pleural effusion (potentially partially loculated) with extensive passive atelectasis in the left lung. 2. Ill-defined opacities developing in the right lung  base, concerning for developing infection or sequela of aspiration.   Electronically Signed   By: Vinnie Langton M.D.   On: 06/22/2013 16:21    ASSESSMENT / PLAN:  PULMONARY  A:  Acute hypoxic respiratory failure, mainly 2/2 large hemothorax/pleural effusion >improved and extubated Large left pleural effusion > hemothorax, uncertain etiology > underlying malignancy (evidence of asbestos exposure)? Doubt infectious cause  CAP  P:  Supplemental oxygen Follow CXR Deescalate Abx Stop Vanc, Zosyn Continue levaquin Taping off Solumedrol>>>80 BID CVTS consult ( called and will see patient today) >>>discussed with patient and family   CARDIOVASCULAR  A:  Demand ischemia, resolved  Hx PAF  Hx of CAD and s/p stent in 1997. 100% RCA occlusion  HTN P:  Trend Trops>>>Neg  Transfusion PRBC if HGB < 8 (demand ischemia and CAD) Stop Coumadin and INR reversed  Add home dose Cardizem po Hold home Atenolol   RENAL  A:  Hypokalemia AKI Cr 1.64>>>? Prerenal vs ATN,  Improving  Hx of hematuria managed by her Urologist Dr. Harlow Asa  P:  MAP > 65  Follow BMP/Mg K 40 po x 1 Will obtain medical records from Urologist Dr. Harlow Asa office.   GASTROINTESTINAL  A: No acute abnormalities P:  DC Pepcid Carb diet  HEMATOLOGIC  A:  Acute blood loss anemia,  INR therapeutic on coumadin, reversed  Myelodysplastic syndrome  Chronic thrombocytopenia, PLT 80's  VTE Px  P:  Hold coumadin  CBC in am Goal HGB 8 in the setting of demand ischemia and hx extensive CAD  SCDs   INFECTIOUS  A: CAP, parapneumonic effusion?  P:  Abx as above  F/U pleural fluid study  CVTS pending evaluation ( already called)   ENDOCRINE  A:  T2DM  P:  SSI   NEUROLOGIC  A:  Seizure disorder  Hx of CVA  Vent dyschrony  P:  Continue Keppra>>>change po  Continue ASA  PT/OT  Charlann Lange, MD PGY-3  [facility]  06/24/2013, 6:56 AM  Disposition Transfer to SDU. [facility] called. We will continue to follow. CVTS called for consult.   Attending:  I have seen and examined the patient with nurse practitioner/resident and agree with the note above.   I think most of the bloody effusion is gone, I'm just not sure why it was so bloody. Hopefully this was just related to a parapneumonic effusion, but given the pleural thickening bilaterally he may ultimately need a VATS when he gets over this acute illness.    PCCM will see as consult  Roselie Awkward, MD [place] [facility] Pager: [phone] Cell: [phone] If no response, call [phone]

## 2013-06-24 NOTE — Consult Note (Signed)
[address]       [place] [address]             [phone]        Reason for Consult: Left hemothorax  Referring Physician: Dr. Simonne Maffucci  Terry Robertson is an 80 y.o. male.  HPI:   The patient is an 80 year old gentleman with a complicated medical history who has been on coumadin for PAF and a history of stroke. He was admitted on 06/21/2013 with CAP with respiratory distress and a large left pleural effusion. He was transferred to the ICU and was intubated on 4/19 for increased respiratory distress. His INR was 2.4 on admission and this was reversed with FFP and vitamin K.  He had a left chest tube placed by Dr. Titus Mould and it drained 500 cc of old blood. A follow-up chest CT on 4/20 showed near complete drainage of the left pleural effusion with significant persistent consolidation of the left lower lobe. There may be some pleural thickening. Cytology on the fluid was negative for malignancy. Gram stain and culture were negative. He improved and was extubated. Chest tube output remains low. His CXR today is unchanged from yesterday and shows significant consolidation/atelectasis in the left lower lobe. The radiologist read it as showing a moderate pleural effusion but I don't think so since the chest CT yesterday showed minimal residual pleural fluid.  Past Medical History  Diagnosis Date  . Diabetes mellitus   . Hypertension   . Stroke 08/2011, 03/2012    L MCA (Afib RVR while admitted for hypotension) - expressive aphasia (almost totally recovered)  . Seizures     Post CVA; on Keppra  . OSA on CPAP   . Paroxysmal atrial fibrillation     on coumadin; Diagnosed at the time of his 1st CVA  . Obesity, morbid     BMI  . Heart attack 1997    inferior MI with left circumflex stent  . CAD S/P percutaneous coronary angioplasty 1997    status post remote PCI to the LAD and circumflex in 1997, heart cath in 2008 showed widely patent stents in the circumflex and  LAD.  RCA had a mid lesion and then was totally occluded distally.  . Myelodysplastic syndrome     w/mild anemia an neutropenia  . BPH (benign prostatic hyperplasia)     TURP in 12/2010  . CHF (congestive heart failure)   . Asbestosis     family unclear where he was evaluated in the past    Past Surgical History  Procedure Laterality Date  . Transthoracic echocardiogram  08/06/11    EF 55% to 65% with grade2 diastolic dysfunction/pseudonormal with mildly dilated left atrium, had evidence of elevated filling ressures in the left vientricle and left atrium  . Cardiac catheterization  02/08/07    widely patent stent in the circumflex and LAD.  RCA had a mid lesion and then was totally occluded distally. Cook bare-metal 3.5x20 mm stent  . Transurethral resection of prostate  12/2010  . Nm myoview ltd  01/09/1999    Inferior wall thinning with possible mild ischemia versus infarct. This would correlate well with his known RCA occlusion    Family History  Problem Relation Age of Onset  . Heart disease Mother   . Heart attack Father 6    Social History:  reports that he quit smoking about 25 years ago. His smoking use included Cigarettes. He smoked 0.00 packs  per day. He has never used smokeless tobacco. He reports that he does not drink alcohol or use illicit drugs.  Allergies: No Known Allergies  Medications:  I have reviewed the patient's current medications. Prior to Admission:  Prescriptions prior to admission  Medication Sig Dispense Refill  . aspirin EC 81 MG tablet Take 81 mg by mouth daily.      Marland Kitchen atenolol (TENORMIN) 25 MG tablet Take 12.5 mg by mouth daily.      Marland Kitchen diltiazem (DILACOR XR) 180 MG 24 hr capsule Take 180 mg by mouth daily.      . fish oil-omega-3 fatty acids 1000 MG capsule Take 1 g by mouth daily.      . furosemide (LASIX) 20 MG tablet Take 1 tablet (20 mg total) by mouth daily with breakfast.  30 tablet  1  . Icosapent Ethyl (VASCEPA) 1 G CAPS Take 1 g by mouth  daily.      Marland Kitchen levETIRAcetam (KEPPRA) 500 MG tablet Take 1 tablet (500 mg total) by mouth every 12 (twelve) hours.  60 tablet  6  . nitroGLYCERIN (NITROSTAT) 0.4 MG SL tablet Place 0.4 mg under the tongue every 5 (five) minutes as needed for chest pain.      . simvastatin (ZOCOR) 20 MG tablet Take 1 tablet (20 mg total) by mouth daily.  30 tablet  0  . terazosin (HYTRIN) 2 MG capsule Take 2 mg by mouth at bedtime.      . Vitamin D, Ergocalciferol, (DRISDOL) 50000 UNITS CAPS Take 50,000 Units by mouth 2 (two) times a week. Takes 1 capsule on Wednesday and Saturday every week      . warfarin (COUMADIN) 5 MG tablet Take 7.5 mg by mouth daily at 6 PM. Take 1 & 1/2 tablets by mouth daily.       Scheduled: . antiseptic oral rinse  15 mL Mouth Rinse BID  . aspirin EC  81 mg Oral Daily  . diltiazem  180 mg Oral Daily  . insulin aspart  1-3 Units Subcutaneous 6 times per day  . levETIRAcetam  500 mg Oral BID  . levofloxacin  750 mg Oral Daily  . methylPREDNISolone (SOLU-MEDROL) injection  80 mg Intravenous Q12H  . sodium chloride  3 mL Intravenous Q12H   Continuous:  EFE:OFHQRF chloride, sodium chloride, albuterol, sodium chloride  Results for orders placed during the hospital encounter of 06/21/13 (from the past 48 hour(s))  TROPONIN I     Status: None   Collection Time    06/22/13  9:43 PM      Result Value Ref Range   Troponin I <0.30  <0.30 ng/mL   Comment:            Due to the release kinetics of cTnI,     a negative result within the first hours     of the onset of symptoms does not rule out     myocardial infarction with certainty.     If myocardial infarction is still suspected,     repeat the test at appropriate intervals.  LACTIC ACID, PLASMA     Status: None   Collection Time    06/22/13  9:43 PM      Result Value Ref Range   Lactic Acid, Venous 1.3  0.5 - 2.2 mmol/L  PROTIME-INR     Status: Abnormal   Collection Time    06/22/13  9:45 PM      Result Value Ref Range    Prothrombin  Time 17.1 (*) 11.6 - 15.2 seconds   INR 1.43  0.00 - 1.49  PREPARE FRESH FROZEN PLASMA     Status: None   Collection Time    06/22/13 10:00 PM      Result Value Ref Range   Unit Number [id number]     Blood Component Type THAWED PLASMA     Unit division 00     Status of Unit ISSUED,FINAL     Transfusion Status OK TO TRANSFUSE     Unit Number [id number]     Blood Component Type THAWED PLASMA     Unit division 00     Status of Unit ISSUED,FINAL     Transfusion Status OK TO TRANSFUSE    CBC     Status: Abnormal   Collection Time    06/22/13 10:03 PM      Result Value Ref Range   WBC 10.4  4.0 - 10.5 K/uL   RBC 3.07 (*) 4.22 - 5.81 MIL/uL   Hemoglobin 8.1 (*) 13.0 - 17.0 g/dL   HCT 26.3 (*) 39.0 - 52.0 %   MCV 85.7  78.0 - 100.0 fL   MCH 26.4  26.0 - 34.0 pg   MCHC 30.8  30.0 - 36.0 g/dL   RDW 15.4  11.5 - 15.5 %   Platelets 65 (*) 150 - 400 K/uL   Comment: SPECIMEN CHECKED FOR CLOTS     REPEATED TO VERIFY     PLATELET COUNT CONFIRMED BY SMEAR     LARGE PLATELETS PRESENT  PH, BODY FLUID     Status: None   Collection Time    06/22/13 10:50 PM      Result Value Ref Range   pH, Fluid Type       Value: CORRECTED ON 04/19 AT 2255: PREVIOUSLY REPORTED AS BODY FLUID   Comment: CORRECTED ON 04/20 AT 1007: PREVIOUSLY REPORTED AS FLUID PLEURAL, CORRECTED ON 04/19 AT 2255: PREVIOUSLY REPORTED AS Body Fluid   pH, Fluid 8.50     Comment: Performed at [facility], PLEURAL FLUID     Status: None   Collection Time    06/22/13 10:52 PM      Result Value Ref Range   Amylase, Pleural Fluid 187     Comment: (NOTE)     Reference range:     Values are considered abnormal if they are greater than or equal to     two times a simultaneously analyzed serum value.     Performed at [facility], CAPILLARY     Status: Abnormal   Collection Time    06/23/13 12:20 AM      Result Value Ref Range   Glucose-Capillary 180 (*) 70 - 99 mg/dL    Comment 1 Notify RN    MRSA PCR SCREENING     Status: None   Collection Time    06/23/13 12:58 AM      Result Value Ref Range   MRSA by PCR NEGATIVE  NEGATIVE   Comment:            The GeneXpert MRSA Assay (FDA     approved for NASAL specimens     only), is one component of a     comprehensive MRSA colonization     surveillance program. It is not     intended to diagnose MRSA     infection nor to guide or     monitor treatment for     MRSA  infections.  PREPARE PLATELET PHERESIS     Status: None   Collection Time    06/23/13  3:49 AM      Result Value Ref Range   Unit Number [id number]     Blood Component Type PLTPHER LR1     Unit division 00     Status of Unit ISSUED,FINAL     Transfusion Status OK TO TRANSFUSE    GLUCOSE, CAPILLARY     Status: Abnormal   Collection Time    06/23/13  4:11 AM      Result Value Ref Range   Glucose-Capillary 189 (*) 70 - 99 mg/dL   Comment 1 Notify RN    BLOOD GAS, ARTERIAL     Status: Abnormal   Collection Time    06/23/13  5:03 AM      Result Value Ref Range   FIO2 0.50     Delivery systems VENTILATOR     Mode PRESSURE REGULATED VOLUME CONTROL     VT 500     Rate 18     Peep/cpap 5.0     pH, Arterial 7.389  7.350 - 7.450   pCO2 arterial 38.7  35.0 - 45.0 mmHg   pO2, Arterial 63.6 (*) 80.0 - 100.0 mmHg   Bicarbonate 22.9  20.0 - 24.0 mEq/L   TCO2 24.1  0 - 100 mmol/L   Acid-base deficit 1.4  0.0 - 2.0 mmol/L   O2 Saturation 91.6     Patient temperature 98.6     Collection site RIGHT RADIAL     Drawn by [phone]     Sample type ARTERIAL DRAW     Allens test (pass/fail) PASS  PASS  TROPONIN I     Status: None   Collection Time    06/23/13  8:35 AM      Result Value Ref Range   Troponin I <0.30  <0.30 ng/mL   Comment:            Due to the release kinetics of cTnI,     a negative result within the first hours     of the onset of symptoms does not rule out     myocardial infarction with certainty.     If myocardial infarction  is still suspected,     repeat the test at appropriate intervals.  LACTIC ACID, PLASMA     Status: None   Collection Time    06/23/13  8:35 AM      Result Value Ref Range   Lactic Acid, Venous 1.1  0.5 - 2.2 mmol/L  BASIC METABOLIC PANEL     Status: Abnormal   Collection Time    06/23/13  8:35 AM      Result Value Ref Range   Sodium 139  137 - 147 mEq/L   Potassium 3.6 (*) 3.7 - 5.3 mEq/L   Chloride 104  96 - 112 mEq/L   CO2 22  19 - 32 mEq/L   Glucose, Bld 177 (*) 70 - 99 mg/dL   BUN 25 (*) 6 - 23 mg/dL   Comment: DELTA CHECK NOTED   Creatinine, Ser 1.64 (*) 0.50 - 1.35 mg/dL   Calcium 8.3 (*) 8.4 - 10.5 mg/dL   GFR calc non Af Amer 37 (*) >90 mL/min   GFR calc Af Amer 43 (*) >90 mL/min   Comment: (NOTE)     The eGFR has been calculated using the CKD EPI equation.     This calculation has not been validated  in all clinical situations.     eGFR's persistently <90 mL/min signify possible Chronic Kidney     Disease.  MAGNESIUM     Status: None   Collection Time    06/23/13  8:35 AM      Result Value Ref Range   Magnesium 2.2  1.5 - 2.5 mg/dL  PHOSPHORUS     Status: None   Collection Time    06/23/13  8:35 AM      Result Value Ref Range   Phosphorus 2.7  2.3 - 4.6 mg/dL  CBC     Status: Abnormal   Collection Time    06/23/13  8:35 AM      Result Value Ref Range   WBC 9.0  4.0 - 10.5 K/uL   RBC 2.80 (*) 4.22 - 5.81 MIL/uL   Hemoglobin 7.3 (*) 13.0 - 17.0 g/dL   HCT 23.9 (*) 39.0 - 52.0 %   MCV 85.4  78.0 - 100.0 fL   MCH 26.1  26.0 - 34.0 pg   MCHC 30.5  30.0 - 36.0 g/dL   RDW 15.4  11.5 - 15.5 %   Platelets 75 (*) 150 - 400 K/uL   Comment: PLATELET COUNT CONFIRMED BY SMEAR  PROTIME-INR     Status: Abnormal   Collection Time    06/23/13  8:35 AM      Result Value Ref Range   Prothrombin Time 16.5 (*) 11.6 - 15.2 seconds   INR 1.37  0.00 - 1.49  LACTATE DEHYDROGENASE     Status: None   Collection Time    06/23/13  8:35 AM      Result Value Ref Range   LDH 215   94 - 250 U/L  GLUCOSE, CAPILLARY     Status: Abnormal   Collection Time    06/23/13  8:38 AM      Result Value Ref Range   Glucose-Capillary 162 (*) 70 - 99 mg/dL  PREPARE RBC (CROSSMATCH)     Status: None   Collection Time    06/23/13 10:12 AM      Result Value Ref Range   Order Confirmation ORDER PROCESSED BY BLOOD BANK    TYPE AND SCREEN     Status: None   Collection Time    06/23/13 10:12 AM      Result Value Ref Range   ABO/RH(D) O POS     Antibody Screen NEG     Sample Expiration 06/26/2013     Unit Number [id number]     Blood Component Type RED CELLS,LR     Unit division 00     Status of Unit ISSUED,FINAL     Transfusion Status OK TO TRANSFUSE     Crossmatch Result Compatible    GLUCOSE, CAPILLARY     Status: Abnormal   Collection Time    06/23/13 11:33 AM      Result Value Ref Range   Glucose-Capillary 147 (*) 70 - 99 mg/dL  PREPARE PLATELET PHERESIS     Status: None   Collection Time    06/23/13  2:30 PM      Result Value Ref Range   Unit Number [id number]     Blood Component Type PLTPHER LR1     Unit division 00     Status of Unit ISSUED,FINAL     Transfusion Status OK TO TRANSFUSE    GLUCOSE, CAPILLARY     Status: Abnormal   Collection Time    06/23/13  3:58 PM  Result Value Ref Range   Glucose-Capillary 128 (*) 70 - 99 mg/dL  LACTIC ACID, PLASMA     Status: None   Collection Time    06/23/13  6:30 PM      Result Value Ref Range   Lactic Acid, Venous 1.1  0.5 - 2.2 mmol/L  CBC     Status: Abnormal   Collection Time    06/23/13  6:30 PM      Result Value Ref Range   WBC 14.0 (*) 4.0 - 10.5 K/uL   RBC 2.97 (*) 4.22 - 5.81 MIL/uL   Hemoglobin 8.0 (*) 13.0 - 17.0 g/dL   HCT 25.2 (*) 39.0 - 52.0 %   MCV 84.8  78.0 - 100.0 fL   MCH 26.9  26.0 - 34.0 pg   MCHC 31.7  30.0 - 36.0 g/dL   RDW 15.0  11.5 - 15.5 %   Platelets 114 (*) 150 - 400 K/uL   Comment: SPECIMEN CHECKED FOR CLOTS     REPEATED TO VERIFY     PLATELET COUNT CONFIRMED BY SMEAR       POST TRANSFUSION SPECIMEN     LARGE PLATELETS PRESENT  GLUCOSE, CAPILLARY     Status: Abnormal   Collection Time    06/23/13  7:22 PM      Result Value Ref Range   Glucose-Capillary 122 (*) 70 - 99 mg/dL  LACTIC ACID, PLASMA     Status: None   Collection Time    06/23/13  9:25 PM      Result Value Ref Range   Lactic Acid, Venous 1.1  0.5 - 2.2 mmol/L  GLUCOSE, CAPILLARY     Status: Abnormal   Collection Time    06/24/13 12:16 AM      Result Value Ref Range   Glucose-Capillary 131 (*) 70 - 99 mg/dL   Comment 1 Documented in Chart     Comment 2 Notify RN    CBC     Status: Abnormal   Collection Time    06/24/13  1:02 AM      Result Value Ref Range   WBC 12.9 (*) 4.0 - 10.5 K/uL   Comment: WHITE COUNT CONFIRMED ON SMEAR     REPEATED TO VERIFY   RBC 2.93 (*) 4.22 - 5.81 MIL/uL   Hemoglobin 7.8 (*) 13.0 - 17.0 g/dL   HCT 24.7 (*) 39.0 - 52.0 %   MCV 84.3  78.0 - 100.0 fL   MCH 26.6  26.0 - 34.0 pg   MCHC 31.6  30.0 - 36.0 g/dL   RDW 15.2  11.5 - 15.5 %   Platelets 109 (*) 150 - 400 K/uL   Comment: REPEATED TO VERIFY     PLATELET COUNT CONFIRMED BY SMEAR  BLOOD GAS, ARTERIAL     Status: Abnormal   Collection Time    06/24/13  3:25 AM      Result Value Ref Range   O2 Content 4.0     Delivery systems NASAL CANNULA     pH, Arterial 7.427  7.350 - 7.450   pCO2 arterial 36.2  35.0 - 45.0 mmHg   pO2, Arterial 76.8 (*) 80.0 - 100.0 mmHg   Bicarbonate 23.5  20.0 - 24.0 mEq/L   TCO2 24.7  0 - 100 mmol/L   Acid-base deficit 0.3  0.0 - 2.0 mmol/L   O2 Saturation 95.6     Patient temperature 98.2     Collection site RIGHT RADIAL  Drawn by [id number]     Sample type ARTERIAL DRAW     Allens test (pass/fail) PASS  PASS  GLUCOSE, CAPILLARY     Status: Abnormal   Collection Time    06/24/13  3:31 AM      Result Value Ref Range   Glucose-Capillary 139 (*) 70 - 99 mg/dL   Comment 1 Documented in Chart     Comment 2 Notify RN    BASIC METABOLIC PANEL     Status: Abnormal    Collection Time    06/24/13  5:00 AM      Result Value Ref Range   Sodium 145  137 - 147 mEq/L   Potassium 3.6 (*) 3.7 - 5.3 mEq/L   Chloride 108  96 - 112 mEq/L   CO2 23  19 - 32 mEq/L   Glucose, Bld 150 (*) 70 - 99 mg/dL   BUN 26 (*) 6 - 23 mg/dL   Creatinine, Ser 1.16  0.50 - 1.35 mg/dL   Calcium 8.4  8.4 - 10.5 mg/dL   GFR calc non Af Amer 57 (*) >90 mL/min   GFR calc Af Amer 66 (*) >90 mL/min   Comment: (NOTE)     The eGFR has been calculated using the CKD EPI equation.     This calculation has not been validated in all clinical situations.     eGFR's persistently <90 mL/min signify possible Chronic Kidney     Disease.  CBC     Status: Abnormal   Collection Time    06/24/13  6:13 AM      Result Value Ref Range   WBC 12.0 (*) 4.0 - 10.5 K/uL   Comment: WHITE COUNT CONFIRMED ON SMEAR   RBC 2.86 (*) 4.22 - 5.81 MIL/uL   Hemoglobin 7.7 (*) 13.0 - 17.0 g/dL   HCT 24.2 (*) 39.0 - 52.0 %   MCV 84.6  78.0 - 100.0 fL   MCH 26.9  26.0 - 34.0 pg   MCHC 31.8  30.0 - 36.0 g/dL   RDW 15.2  11.5 - 15.5 %   Platelets 111 (*) 150 - 400 K/uL   Comment: PLATELET COUNT CONFIRMED BY SMEAR     LARGE PLATELETS PRESENT  GLUCOSE, CAPILLARY     Status: Abnormal   Collection Time    06/24/13  7:33 AM      Result Value Ref Range   Glucose-Capillary 158 (*) 70 - 99 mg/dL  GLUCOSE, CAPILLARY     Status: Abnormal   Collection Time    06/24/13 11:54 AM      Result Value Ref Range   Glucose-Capillary 179 (*) 70 - 99 mg/dL  GLUCOSE, CAPILLARY     Status: Abnormal   Collection Time    06/24/13  4:00 PM      Result Value Ref Range   Glucose-Capillary 162 (*) 70 - 99 mg/dL    Ct Chest Wo Contrast  06/23/2013   CLINICAL DATA:  Empyema with chest tube.  EXAM: CT CHEST WITHOUT CONTRAST  TECHNIQUE: Multidetector CT imaging of the chest was performed following the standard protocol without IV contrast.  COMPARISON:  DG CHEST 1V PORT dated 06/22/2013; CT CHEST W/CM dated 06/22/2013  FINDINGS: Since the  prior study, there is been interval placement of a left chest tube with significant evacuation of previous left pleural effusion. There is residual consolidation and atelectasis in the left lung. Mild subcutaneous emphysema in the left chest. Endotracheal and enteric tubes are in place.  Left central venous catheter with tip in the brachiocephalic vein. Cardiac enlargement with Coronary artery calcifications. Calcification of thoracic aorta without aneurysm. Scattered mediastinal lymph nodes are not pathologically enlarged and are likely reactive, similar to prior study. Visualization of lung fields is limited due to respiratory motion artifact. Atelectasis or consolidation on the right lung base. Intramuscular lipoma versus elastofibroma in the right posterior chest wall. No destructive bone lesions.  IMPRESSION: Interval placement of left chest tube with evacuation of most of the left pleural fluid seen previously. Residual consolidation or atelectasis in both lung bases, greater on the left. Cardiac enlargement.   Electronically Signed   By: Lucienne Capers M.D.   On: 06/23/2013 00:57   Dg Chest Port 1 View  06/24/2013   CLINICAL DATA:  Pneumothorax  EXAM: PORTABLE CHEST - 1 VIEW  COMPARISON:  06/23/2013  FINDINGS: Left chest tube remains in good position. Moderate left effusion and left lower lobe atelectasis unchanged. No pneumothorax.  Endotracheal tube removed.  Left jugular catheter in the SVC.  Right lung remains clear.  IMPRESSION: Endotracheal tube removed.  No significant change in moderate left effusion and left lower lobe atelectasis. No pneumothorax. Left chest tube remains in place.   Electronically Signed   By: Franchot Gallo M.D.   On: 06/24/2013 07:14   Dg Chest Port 1 View  06/23/2013   CLINICAL DATA:  Chest tube placement  EXAM: PORTABLE CHEST - 1 VIEW  COMPARISON:  Four hundred nineteen  FINDINGS: Endotracheal tube has its tip 4 cm above the carina. Nasogastric tube enters the stomach.  Left chest tube remains well-positioned. The right hemi thorax is clear. Moderate amount of pleural density persists on the left with volume loss or infiltrate in the left lower lobe. Left internal jugular central line has its tip at the innominate SVC junction. No pleural air.  IMPRESSION: No change since yesterday. Lines and tubes well positioned. Persistent pleural density on the left with left lower lobe atelectasis/ infiltrate.   Electronically Signed   By: Nelson Chimes M.D.   On: 06/23/2013 07:06    Review of Systems  Unable to perform ROS: mental acuity   Blood pressure 150/71, pulse 91, temperature 97.9 F (36.6 C), temperature source Oral, resp. rate 32, height '5\' 5"'  (1.651 m), weight 112.2 kg (247 lb 5.7 oz), SpO2 95.00%. Physical Exam  Constitutional: He is oriented to person, place, and time.  Elderly African-American male in no distress  HENT:  Head: Normocephalic and atraumatic.  Eyes: EOM are normal. Pupils are equal, round, and reactive to light.  Scleral edema  Neck: No JVD present. No thyromegaly present.  Cardiovascular: Normal rate, regular rhythm and normal heart sounds.   No murmur heard. Respiratory: Effort normal.  Decreased breath sounds over the left lower lobe.  GI: Soft. Bowel sounds are normal. He exhibits no distension and no mass. There is no tenderness.  Musculoskeletal: He exhibits no edema.  Lymphadenopathy:    He has no cervical adenopathy.  Neurological: He is alert and oriented to person, place, and time.  Confused about details of his past medical history and prior symptoms. He says that his friend keeps track of all that.  Skin: Skin is warm and dry.  Psychiatric: He has a normal mood and affect.    CLINICAL DATA: Empyema with chest tube.  EXAM:  CT CHEST WITHOUT CONTRAST  TECHNIQUE:  Multidetector CT imaging of the chest was performed following the  standard protocol without IV contrast.  COMPARISON:  DG CHEST 1V PORT dated 06/22/2013; CT  CHEST W/CM dated  06/22/2013  FINDINGS:  Since the prior study, there is been interval placement of a left  chest tube with significant evacuation of previous left pleural  effusion. There is residual consolidation and atelectasis in the  left lung. Mild subcutaneous emphysema in the left chest.  Endotracheal and enteric tubes are in place. Left central venous  catheter with tip in the brachiocephalic vein. Cardiac enlargement  with Coronary artery calcifications. Calcification of thoracic aorta  without aneurysm. Scattered mediastinal lymph nodes are not  pathologically enlarged and are likely reactive, similar to prior  study. Visualization of lung fields is limited due to respiratory  motion artifact. Atelectasis or consolidation on the right lung  base. Intramuscular lipoma versus elastofibroma in the right  posterior chest wall. No destructive bone lesions.  IMPRESSION:  Interval placement of left chest tube with evacuation of most of the  left pleural fluid seen previously. Residual consolidation or  atelectasis in both lung bases, greater on the left. Cardiac  enlargement.  Electronically Signed  By: Lucienne Capers M.D.  On: 06/23/2013 00:57   Assessment/Plan:  He was admitted with respiratory distress felt to be due to CAP and a large bloody left pleural effusion. He has been on coumadin with an INR in the normal range and does not remember any chest trauma or falls. This could be a para-pneumonic effusion but could also be due to some pleural space disease. I think we always have to be concerned about the possibility of a pleural space malignancy with a bloody effusion. The effusion is nearly completely drained after chest tube placement and I don't see any reason to do a VATS at this time. Hopefully the left lower lobe will clear with continued attention to pulmonary toilet and antibiotics. I would keep the chest tube in until there is no significant drainage and would not  restart coumadin until this is all resolved because the risk of bleeding is greater than the risk of stroke at this time. Assuming he continues to progress, he can have a follow up CT of the chest in a month or so to be sure that everything clears up or decide if further diagnostic procedures are needed. I would be as conservative as possible given his age and medical problems.  Gaye Pollack 06/24/2013, 8:05 PM

## 2013-06-24 NOTE — Evaluation (Signed)
Clinical/Bedside Swallow Evaluation Patient Details  Name: Ronda Kazmi MRN: [id number] Date of Birth: 25-Nov-1928  Today's Date: 06/24/2013 Time: 0910-0918 SLP Time Calculation (min): 8 min  Past Medical History:  Past Medical History  Diagnosis Date  . Diabetes mellitus   . Hypertension   . Stroke 08/2011, 03/2012    L MCA (Afib RVR while admitted for hypotension) - expressive aphasia (almost totally recovered)  . Seizures     Post CVA; on Keppra  . OSA on CPAP   . Paroxysmal atrial fibrillation     on coumadin; Diagnosed at the time of his 1st CVA  . Obesity, morbid     BMI  . Heart attack 1997    inferior MI with left circumflex stent  . CAD S/P percutaneous coronary angioplasty 1997    status post remote PCI to the LAD and circumflex in 1997, heart cath in 2008 showed widely patent stents in the circumflex and LAD.  RCA had a mid lesion and then was totally occluded distally.  . Myelodysplastic syndrome     w/mild anemia an neutropenia  . BPH (benign prostatic hyperplasia)     TURP in 12/2010  . CHF (congestive heart failure)   . Asbestosis     family unclear where he was evaluated in the past   Past Surgical History:  Past Surgical History  Procedure Laterality Date  . Transthoracic echocardiogram  08/06/11    EF 55% to 65% with grade2 diastolic dysfunction/pseudonormal with mildly dilated left atrium, had evidence of elevated filling ressures in the left vientricle and left atrium  . Cardiac catheterization  02/08/07    widely patent stent in the circumflex and LAD.  RCA had a mid lesion and then was totally occluded distally. Cook bare-metal 3.5x20 mm stent  . Transurethral resection of prostate  12/2010  . Nm myoview ltd  01/09/1999    Inferior wall thinning with possible mild ischemia versus infarct. This would correlate well with his known RCA occlusion   HPI:  Patient is a 80 year old man with a PMH of Stroke (08/2011, 03/2012); Seizures; PAF on coumadin; CAD S/P  percutaneous coronary angioplasty (1997); chronic thrombocytopenia and Myelodysplastic syndrome, who presented with CAP initially. He was noted to have acute respiratory distress and large left pleural effusion, and was subsequently transferred to ICU for management. Intubated 4/19-4/20.  Hx of dysphagia and followed by SLP services in the past.  (Last evaluated 03/2012, ultimately d/c'd on a regular diet.)   Assessment / Plan / Recommendation Clinical Impression  Pt presents with functional oropharyngeal swallow with adequate mastication, reliable swallow trigger, and no overt s/s of aspiration.  Pt currently on carb modified diet, thin liquids and tolerating well.  Recommend continuing on current diet.  No SLP f/u warranted.      Aspiration Risk  Mild    Diet Recommendation Regular;Thin liquid   Liquid Administration via: Cup Medication Administration: Whole meds with liquid (If coughing, give with puree) Supervision:  (assist as needed) Postural Changes and/or Swallow Maneuvers: Seated upright 90 degrees    Other  Recommendations Oral Care Recommendations: Oral care BID   Follow Up Recommendations  None        Swallow Study Prior Functional Status       General Date of Onset: 06/21/13 Type of Study: Bedside swallow evaluation Previous Swallow Assessment: see H7P Diet Prior to this Study: Regular;Thin liquids Temperature Spikes Noted: No Respiratory Status: Room air History of Recent Intubation: Yes Length of Intubations (days):  1 days Date extubated: 06/23/13 Behavior/Cognition: Alert;Cooperative Oral Cavity - Dentition: Adequate natural dentition Self-Feeding Abilities: Able to feed self;Needs assist Patient Positioning: Upright in chair Baseline Vocal Quality: Clear Volitional Cough: Strong Volitional Swallow: Able to elicit    Oral/Motor/Sensory Function Overall Oral Motor/Sensory Function: Appears within functional limits for tasks assessed   Ice Chips Ice chips:  Within functional limits Presentation: Spoon   Thin Liquid Thin Liquid: Within functional limits Presentation: Cup    Nectar Thick Nectar Thick Liquid: Not tested   Honey Thick Honey Thick Liquid: Not tested   Puree Puree: Within functional limits   Solid  Evonda Enge L. [place], [place] CCC/SLP Pager [phone]     Solid: Within functional limits       Assunta Curtis 06/24/2013,9:28 AM

## 2013-06-24 NOTE — Progress Notes (Signed)
CCM Interim Progress Note.  Patient requesting pain medication for low back pain - reports he takes hydrocodone at home 4 times/day - history of low back injury in the 1950s (beam fell on him at [facility]).  Pain is similar to his usual back pain.  Plan: Start Norco 5-325 q6h prn, may escalate if required  Fransisca Kaufmann, MD (IMTS, PGY3)

## 2013-06-24 NOTE — Progress Notes (Signed)
CRITICAL VALUE ALERT  Critical value received:  + blood cx, aerobic bottle, gram + rods  Date of notification:  06/24/2013  Time of notification:  6:51 AM  Critical value read back:yes  Nurse who received alert:  Shadae Reino Bruce Donath  MD notified (1st page):  Dr. Nicoletta Dress  Time of first page:  6:51 AM  MD notified (2nd page):  Time of second page:  Responding MD:  Dr. Nicoletta Dress  Time MD responded:  6:51 AM

## 2013-06-25 ENCOUNTER — Inpatient Hospital Stay (HOSPITAL_COMMUNITY): Payer: Medicare HMO

## 2013-06-25 ENCOUNTER — Telehealth: Payer: Self-pay

## 2013-06-25 DIAGNOSIS — N179 Acute kidney failure, unspecified: Secondary | ICD-10-CM

## 2013-06-25 DIAGNOSIS — J90 Pleural effusion, not elsewhere classified: Secondary | ICD-10-CM

## 2013-06-25 DIAGNOSIS — J942 Hemothorax: Secondary | ICD-10-CM | POA: Diagnosis present

## 2013-06-25 LAB — CBC
HEMATOCRIT: 24.6 % — AB (ref 39.0–52.0)
Hemoglobin: 7.6 g/dL — ABNORMAL LOW (ref 13.0–17.0)
MCH: 26.3 pg (ref 26.0–34.0)
MCHC: 30.9 g/dL (ref 30.0–36.0)
MCV: 85.1 fL (ref 78.0–100.0)
Platelets: 128 10*3/uL — ABNORMAL LOW (ref 150–400)
RBC: 2.89 MIL/uL — ABNORMAL LOW (ref 4.22–5.81)
RDW: 15.6 % — ABNORMAL HIGH (ref 11.5–15.5)
WBC: 8.6 10*3/uL (ref 4.0–10.5)

## 2013-06-25 LAB — TOTAL BILIRUBIN, BODY FLUID: Total bilirubin, fluid: 0.3 mg/dL

## 2013-06-25 LAB — GLUCOSE, CAPILLARY
GLUCOSE-CAPILLARY: 130 mg/dL — AB (ref 70–99)
GLUCOSE-CAPILLARY: 132 mg/dL — AB (ref 70–99)
GLUCOSE-CAPILLARY: 146 mg/dL — AB (ref 70–99)
Glucose-Capillary: 196 mg/dL — ABNORMAL HIGH (ref 70–99)
Glucose-Capillary: 169 mg/dL — ABNORMAL HIGH (ref 70–99)
Glucose-Capillary: 152 mg/dL — ABNORMAL HIGH (ref 70–99)

## 2013-06-25 LAB — BASIC METABOLIC PANEL
BUN: 27 mg/dL — ABNORMAL HIGH (ref 6–23)
CO2: 25 meq/L (ref 19–32)
CREATININE: 0.94 mg/dL (ref 0.50–1.35)
Calcium: 8.6 mg/dL (ref 8.4–10.5)
Chloride: 111 mEq/L (ref 96–112)
GFR calc Af Amer: 88 mL/min — ABNORMAL LOW (ref >90)
GFR calc non Af Amer: 76 mL/min — ABNORMAL LOW (ref >90)
GLUCOSE: 141 mg/dL — AB (ref 70–99)
Potassium: 3.9 mEq/L (ref 3.7–5.3)
SODIUM: 146 meq/L (ref 137–147)

## 2013-06-25 LAB — CULTURE, BLOOD (ROUTINE X 2)

## 2013-06-25 LAB — MAGNESIUM: MAGNESIUM: 2.5 mg/dL (ref 1.5–2.5)

## 2013-06-25 MED ORDER — ATENOLOL 12.5 MG HALF TABLET
12.5000 mg | ORAL_TABLET | Freq: Every day | ORAL | Status: DC
  Administered 2013-06-25 – 2013-06-28 (×4): 12.5 mg via ORAL
  Filled 2013-06-25 (×4): qty 1

## 2013-06-25 MED ORDER — INSULIN ASPART 100 UNIT/ML ~~LOC~~ SOLN
0.0000 [IU] | Freq: Three times a day (TID) | SUBCUTANEOUS | Status: DC
  Administered 2013-06-25: 2 [IU] via SUBCUTANEOUS
  Administered 2013-06-26: 1 [IU] via SUBCUTANEOUS
  Administered 2013-06-26 (×2): 3 [IU] via SUBCUTANEOUS
  Administered 2013-06-27: 1 [IU] via SUBCUTANEOUS

## 2013-06-25 MED ORDER — PREDNISONE 10 MG PO TABS
60.0000 mg | ORAL_TABLET | Freq: Every day | ORAL | Status: DC
  Administered 2013-06-26 – 2013-06-28 (×3): 60 mg via ORAL
  Filled 2013-06-25 (×4): qty 1

## 2013-06-25 NOTE — Care Management Note (Signed)
    Page 1 of 1   06/25/2013     10:08:36 AM CARE MANAGEMENT NOTE 06/25/2013  Patient:  Terry Robertson, Terry Robertson   Account Number:  [id number]  Date Initiated:  06/23/2013  Documentation initiated by:  [facility]  Subjective/Objective Assessment:   Admitted with resp failure.     Action/Plan:   Anticipated DC Date:  06/27/2013   Anticipated DC Plan:  [place]  CM consult      Choice offered to / List presented to:             Status of service:  In process, will continue to follow Medicare Important Message given?   (If response is "NO", the following Medicare IM given date fields will be blank) Date Medicare IM given:   Date Additional Medicare IM given:    Discharge Disposition:    Per UR Regulation:  Reviewed for med. necessity/level of care/duration of stay  If discussed at [place] of Stay Meetings, dates discussed:    Comments:  Contact:  Terry Robertson Daughter [phone]                 [facility] Friend [phone]                 Terry Robertson, Terry Robertson [phone]                 Terry Robertson, Terry Robertson [phone]  06-25-13 Donita Brooks, [place] [phone] Patient sitting up in chair.  Awake and alert, states lives with son who is disabled and is at home most of the time. Would like to go home with son.  Has had PT at home in past with [facility] and is agreeable to this again.  Will need orders.

## 2013-06-25 NOTE — Progress Notes (Signed)
PULMONARY / CRITICAL CARE MEDICINE   Name: Terry Robertson MRN: [id number] DOB: 06/13/1928    ADMISSION DATE:  06/21/2013 CONSULTATION DATE:  06/25/13  REFERRING MD : [facility]  PRIMARY SERVICE: PCCM   BRIEF PATIENT DESCRIPTION: Patient is a 80 year old man with a PMH of Stroke (08/2011, 03/2012); Seizures; PAF on coumadin; CAD S/P percutaneous coronary angioplasty (1997); chronic thrombocytopenia and Myelodysplastic syndrome, who presented with CAP initially. He was noted to have acute respiratory distress and large left pleural effusion, and was subsequently transferred to ICU for management.   SIGNIFICANT EVENTS / STUDIES:  4/18 Admit for CAP  4/19 Transfer to ICU. Acute respiratory failure and intubated. Left chest tube insertion planned  4/19 FFP x 4 and Vit K 5 mg IV x 1 for INR reverse  4/19 Chest CT s/p chest tube>>>evacuation of most of the left pleural fluid, Residual consolidation or atelectasis in both lung bases, greater on the left. Cardiac enlargement.  4/20 CVL continuous oozing. PLT one unit given. PRBC x 1 unit  4/20 extubated  4/21 CVTS consult>>>concerns over malignancy possibility. No indication for VATSno coumadin for now.  LINES / TUBES:  4/19 left IJ>>>  4/19 OETT>>>4/20  4/19 OGTT>>> 4/20  4/19 Left chest tube>>>  4/19 Foley>>>   CULTURES:  4/18 blood>>> GPR 1/2  4/18 urine>>> Neg  4/18 sputum>>>  4/18 Urine strep>>> Neg  4/18 urine Legionella>>>Neg  4/18 Lactic acid>>>0.6  4/19 PCT>>>1.16  4/19 Pleural fluid culture >>>  4/19 Pleural fluid cytology>>>NO MALIGNANT CELLS IDENTIFIED  ANTIBIOTICS:  Vancomycin 4/18>>> 4/21  Zosyn 4/18>>> 4/21  Levaquin 4/19>>>   SUBJECTIVE  Chronic lower back and home pain meds added.  No acute events overnight.  Pending transfer to SDU.   VITAL SIGNS: Temp:  [97.3 F (36.3 C)-98.2 F (36.8 C)] 97.9 F (36.6 C) (04/22 0441) Pulse Rate:  [79-93] 79 (04/21 2200) Resp:  [22-35] 26 (04/21 2300) BP: (138-188)/(58-89) 144/71  mmHg (04/21 2300) SpO2:  [94 %-98 %] 95 % (04/21 2200) HEMODYNAMICS: CVP:  [8 mmHg] 8 mmHg VENTILATOR SETTINGS:   INTAKE / OUTPUT: Intake/Output     04/21 0701 - 04/22 0700   I.V. (mL/kg) 200 (1.8)   IV Piggyback 25   Total Intake(mL/kg) 225 (2)   Urine (mL/kg/hr) 200 (0.1)   Total Output 200   Net +25         PHYSICAL EXAMINATION: General: NAD. A+O x3  HEENT: No JVD  Cardiovascular: RRR, no M/G/R  Lungs: LLL diminished but better air movement. Left chest tube  Abdomen: Soft. NTND. BS x 4  Musculoskeletal: PPP  Skin: intact  LABS:  CBC  Recent Labs Lab 06/24/13 0102 06/24/13 0613 06/25/13 0500  WBC 12.9* 12.0* 8.6  HGB 7.8* 7.7* 7.6*  HCT 24.7* 24.2* 24.6*  PLT 109* 111* 128*   Coag's  Recent Labs Lab 06/22/13 1900 06/22/13 2145 06/23/13 0835  APTT 49*  --   --   INR 1.48 1.43 1.37   BMET  Recent Labs Lab 06/23/13 0835 06/24/13 0500 06/25/13 0500  NA 139 145 146  K 3.6* 3.6* 3.9  CL 104 108 111  CO2 22 23 25   BUN 25* 26* 27*  CREATININE 1.64* 1.16 0.94  GLUCOSE 177* 150* 141*   Electrolytes  Recent Labs Lab 06/22/13 1715 06/23/13 0835 06/24/13 0500 06/25/13 0500  CALCIUM 8.4 8.3* 8.4 8.6  MG 2.1 2.2  --  2.5  PHOS 4.2 2.7  --   --    Sepsis Markers  Recent Labs Lab 06/22/13 1720  06/23/13 0835 06/23/13 1830 06/23/13 2125  LATICACIDVEN  --   < > 1.1 1.1 1.1  PROCALCITON 1.16  --   --   --   --   < > = values in this interval not displayed. ABG  Recent Labs Lab 06/22/13 1649 06/23/13 0503 06/24/13 0325  PHART 7.334* 7.389 7.427  PCO2ART 43.3 38.7 36.2  PO2ART 168.0* 63.6* 76.8*   Liver Enzymes  Recent Labs Lab 06/21/13 1841 06/22/13 0500 06/22/13 1715  AST 20 21 25   ALT 12 13 17   ALKPHOS 53 51 54  BILITOT 0.5 0.6 0.6  ALBUMIN 3.3* 3.0* 3.1*   Cardiac Enzymes  Recent Labs Lab 06/22/13 1720 06/22/13 2143 06/23/13 0835  TROPONINI <0.30 <0.30 <0.30  PROBNP 7439.0*  --   --    Glucose  Recent  Labs Lab 06/24/13 0733 06/24/13 1154 06/24/13 1600 06/24/13 2006 06/25/13 0015 06/25/13 0418  GLUCAP 158* 179* 162* 166* 146* 130*    Imaging Dg Chest Port 1 View  06/24/2013   CLINICAL DATA:  Pneumothorax  EXAM: PORTABLE CHEST - 1 VIEW  COMPARISON:  06/23/2013  FINDINGS: Left chest tube remains in good position. Moderate left effusion and left lower lobe atelectasis unchanged. No pneumothorax.  Endotracheal tube removed.  Left jugular catheter in the SVC.  Right lung remains clear.  IMPRESSION: Endotracheal tube removed.  No significant change in moderate left effusion and left lower lobe atelectasis. No pneumothorax. Left chest tube remains in place.   Electronically Signed   By: Franchot Gallo M.D.   On: 06/24/2013 07:14   Dg Chest Port 1 View  06/23/2013   CLINICAL DATA:  Chest tube placement  EXAM: PORTABLE CHEST - 1 VIEW  COMPARISON:  Four hundred nineteen  FINDINGS: Endotracheal tube has its tip 4 cm above the carina. Nasogastric tube enters the stomach. Left chest tube remains well-positioned. The right hemi thorax is clear. Moderate amount of pleural density persists on the left with volume loss or infiltrate in the left lower lobe. Left internal jugular central line has its tip at the innominate SVC junction. No pleural air.  IMPRESSION: No change since yesterday. Lines and tubes well positioned. Persistent pleural density on the left with left lower lobe atelectasis/ infiltrate.   Electronically Signed   By: Nelson Chimes M.D.   On: 06/23/2013 07:06     ASSESSMENT / PLAN:  PULMONARY  A:  Acute hypoxic respiratory failure, mainly 2/2 large hemothorax/pleural effusion >improved and extubated  Large left hemothorax > presumed parapneumonic effusion, bloody due to warfarin CAP  P:  CT to water seal Supplemental oxygen  Follow CXR  Levaquin Taper off prednisone this week Will need outpatient pulmonary follow up  CARDIOVASCULAR  A:  Demand ischemia, resolved  Hx PAF  Hx of  CAD and s/p stent in 1997. 100% RCA occlusion  HTN  P:  Trend Trops>>>Neg   Hold Coumadin and INR reversed  Add home dose Cardizem and Atenolol   RENAL  A:  AKI>>>resolved Hx of hematuria managed by her Urologist Dr. Harlow Asa  P:  MAP > 65  Follow BMP/Mg   obtain medical records from Urologist Dr. Harlow Asa office.   GASTROINTESTINAL  A: No acute abnormalities  P:  DC Pepcid  Carb diet   HEMATOLOGIC  A:  Acute blood loss anemia, HGB 7.6-7.7 INR therapeutic on coumadin, reversed  Myelodysplastic syndrome  Chronic thrombocytopenia, PLT 80's  VTE Px  P:  Hold coumadin  CBC  in am   SCDs Will no transfuse PRBC today since he is stable, does not have active s/s cardiac ischemia and/or active CAD.  Consider transfuse goal of 8 if recurrent demand ischemia.    INFECTIOUS  A: CAP, parapneumonic effusion?  P:  Abx as above  Continue chest tube F/U pleural fluid study  CVTS consulted>>>no VTAS for now.   ENDOCRINE  A:  T2DM  P:  SSI   NEUROLOGIC  A:  Seizure disorder  Hx of CVA  Vent dyschrony  P:  Continue Keppra>>>change po  Continue ASA  PT/OT   Charlann Lange, MD PGY-3  IMTS   06/25/2013, 6:48 AM   Attending:  I have seen and examined the patient with nurse practitioner/resident and agree with the note above.   CT to Water seal To floor Will follow  Roselie Awkward, MD [place] PCCM Pager: [phone] Cell: [phone] If no response, call [phone]

## 2013-06-25 NOTE — Telephone Encounter (Signed)
Dr Anastasia Pall schedule does not go this far out, I have placed a recall for the pt for BQ.  Nothing further needed.

## 2013-06-25 NOTE — Evaluation (Signed)
Occupational Therapy Evaluation Patient Details Name: Terry Robertson MRN: [id number] DOB: 02-Nov-1928 Today's Date: 06/25/2013    History of Present Illness Pt admittted with acute repiratory failure with large pleural effusion.  Intubated 4/19 to 4/20, no with chest tube. Pt with PMH of CVAs with residual R side weakness, seizures, CAD and chronic back pain.   Clinical Impression   Pt presents with generalized weakness and impaired balance interfering with mobility and ADL.  Pt has baseline R side weakness and expressive difficulties.  He is dependent in LB dressing and this is not a goal area for him.  Pt noted to desaturate on RA to 86%.  Will follow to address self care and ADL transfers in preparation for return home with son.    Follow Up Recommendations  Home health OT;Supervision/Assistance - 24 hour    Equipment Recommendations  None recommended by OT    Recommendations for Other Services       Precautions / Restrictions Precautions Precautions: Fall Precaution Comments: chest tube Restrictions Weight Bearing Restrictions: No      Mobility Bed Mobility Overal bed mobility:  (pt up in chair)                Transfers Overall transfer level: Needs assistance Equipment used: Pushed w/c Transfers: Sit to/from Stand Sit to Stand: Min assist              Balance                                            ADL Overall ADL's : Needs assistance/impaired Eating/Feeding: Set up;Sitting Eating/Feeding Details (indicate cue type and reason): assist to cut food and open packages Grooming: Oral care;Min guard;Standing Grooming Details (indicate cue type and reason): stood x 1.5 minutes Upper Body Bathing: Minimal assitance;Sitting   Lower Body Bathing: Moderate assistance;Sit to/from stand   Upper Body Dressing : Minimal assistance;Sitting   Lower Body Dressing: Moderate assistance;Sit to/from stand   Toilet Transfer: Minimal  assistance;Ambulation           Functional mobility during ADLs: Minimal assistance;Cueing for safety;Wheelchair (pushed w/c as walker) General ADL Comments: Pt is not interested in LB dressing as a goal.     Vision                     Perception     Praxis      Pertinent Vitals/Pain Premedicated for pain, 02to 86 % on RA, returned to 90s with rest break, 2L returned 93%     Hand Dominance Right   Extremity/Trunk Assessment Upper Extremity Assessment Upper Extremity Assessment: RUE deficits/detail RUE Deficits / Details: baseline incoordination, uses as lead RUE Sensation: decreased light touch;decreased proprioception (unaware of toothpaste cap in his hand) RUE Coordination: decreased fine motor   Lower Extremity Assessment Lower Extremity Assessment: Defer to PT evaluation   Cervical / Trunk Assessment Cervical / Trunk Assessment: Kyphotic   Communication Communication Communication: Expressive difficulties   Cognition Arousal/Alertness: Awake/alert Behavior During Therapy: WFL for tasks assessed/performed Overall Cognitive Status: Within Functional Limits for tasks assessed (slow processing, some inconsistencies with PLOF questions)                     General Comments       Exercises       Shoulder Instructions      Home Living  Family/patient expects to be discharged to:: Private residence Living Arrangements: Children (son ) Available Help at Discharge: Family;Available 24 hours/day Type of Home: House Home Access: Level entry     Home Layout: One level     Bathroom Shower/Tub: Occupational psychologist: Handicapped height     Home Equipment: Environmental consultant - 2 wheels;Shower seat;Cane - single point          Prior Functioning/Environment Level of Independence: Needs assistance  Gait / Transfers Assistance Needed: walks with cane in home, but reports he is unsteady ADL's / Homemaking Assistance Needed: Son assists with LB  dressing, cooking. Has a cleaning lady. Communication / Swallowing Assistance Needed: some expressive difficulties from prior CVAs      OT Diagnosis: Generalized weakness   OT Problem List: Decreased strength;Decreased activity tolerance;Impaired balance (sitting and/or standing);Decreased coordination;Decreased cognition;Decreased knowledge of use of DME or AE;Obesity;Impaired UE functional use;Cardiopulmonary status limiting activity;Impaired sensation   OT Treatment/Interventions: Self-care/ADL training;DME and/or AE instruction;Therapeutic activities;Patient/family education;Balance training    OT Goals(Current goals can be found in the care plan section) Acute Rehab OT Goals Patient Stated Goal: Home with son. OT Goal Formulation: With patient Time For Goal Achievement: 07/02/13 Potential to Achieve Goals: Good ADL Goals Pt Will Perform Grooming: with supervision;standing (at least 2 activities) Pt Will Perform Upper Body Bathing: with supervision;sitting Pt Will Perform Upper Body Dressing: with supervision;sitting Pt Will Transfer to Toilet: with supervision;ambulating (elevated toilet seat) Pt Will Perform Toileting - Clothing Manipulation and hygiene: with supervision;sit to/from stand Pt Will Perform Tub/Shower Transfer: with supervision;ambulating;shower seat;rolling walker  OT Frequency: Min 2X/week   Barriers to D/C:            Co-evaluation PT/OT/SLP Co-Evaluation/Treatment: Yes Reason for Co-Treatment: Other (comment);For patient/therapist safety (multiple lines)   OT goals addressed during session: ADL's and self-care      End of Session Equipment Utilized During Treatment: Gait belt Nurse Communication: Mobility status  Activity Tolerance: Patient tolerated treatment well Patient left: in chair;with call bell/phone within reach   Time: [phone] OT Time Calculation (min): 28 min Charges:  OT General Charges $OT Visit: 1 Procedure OT  Evaluation $Initial OT Evaluation Tier I: 1 Procedure OT Treatments $Self Care/Home Management : 8-22 mins G-Codes:    Haze Boyden Rutha Melgoza Jul 04, 2013, 9:39 AM [phone]

## 2013-06-25 NOTE — Progress Notes (Signed)
NUTRITION FOLLOW UP  Intervention:   Continue CHO modified diet to meet nutrition needs.  Nutrition Dx:   Inadequate oral intake related to inability to eat as evidenced by NPO status. Resolved.  No new nutrition diagnosis at this time.  Goal:   Intake to meet >90% of estimated nutrition needs. Met.  Monitor:   PO intake, labs, weight trend.  Assessment:   Patient is a 80 year old man with a PMH of Stroke (08/2011, 03/2012); Seizures; PAF on coumadin; CAD S/P percutaneous coronary angioplasty (1997); chronic thrombocytopenia and Myelodysplastic syndrome, who presented with CAP initially. He was noted to have acute respiratory distress and large left pleural effusion, and was subsequently transferred to ICU and intubated on 4/19.  Patient was extubated on 4/20. TF was never initiated. Diet has been advanced to CHO-modified, patient is consuming 100% of meals per discussion in ICU rounds today. No supplements needed at this time.  Height: Ht Readings from Last 1 Encounters:  06/21/13 '5\' 5"'  (1.651 m)    Weight Status:   Wt Readings from Last 1 Encounters:  06/24/13 247 lb 5.7 oz (112.2 kg)    Re-estimated needs:  Kcal: 1900-2100 Protein: 95-110 gm Fluid: 2 L  Skin: chest tube incision  Diet Order: Carb Control   Intake/Output Summary (Last 24 hours) at 06/25/13 1208 Last data filed at 06/25/13 1000  Gross per 24 hour  Intake    440 ml  Output    300 ml  Net    140 ml    Last BM: 4/20   Labs:   Recent Labs Lab 06/22/13 1715 06/23/13 0835 06/24/13 0500 06/25/13 0500  NA 140 139 145 146  K 4.1 3.6* 3.6* 3.9  CL 101 104 108 111  CO2 '22 22 23 25  ' BUN 12 25* 26* 27*  CREATININE 1.44* 1.64* 1.16 0.94  CALCIUM 8.4 8.3* 8.4 8.6  MG 2.1 2.2  --  2.5  PHOS 4.2 2.7  --   --   GLUCOSE 189* 177* 150* 141*    CBG (last 3)   Recent Labs  06/25/13 0015 06/25/13 0418 06/25/13 0747  GLUCAP 146* 130* 132*    Scheduled Meds: . antiseptic oral rinse  15 mL  Mouth Rinse BID  . aspirin EC  81 mg Oral Daily  . atenolol  12.5 mg Oral Daily  . diltiazem  180 mg Oral Daily  . insulin aspart  1-3 Units Subcutaneous 6 times per day  . levETIRAcetam  500 mg Oral BID  . levofloxacin  750 mg Oral Daily  . [START ON 06/26/2013] predniSONE  60 mg Oral Q breakfast  . sodium chloride  3 mL Intravenous Q12H    Continuous Infusions: None   Molli Barrows, RD, LDN, [place] Pager [phone] After Hours Pager [phone]

## 2013-06-25 NOTE — Evaluation (Signed)
Physical Therapy Evaluation Patient Details Name: Terry Robertson MRN: [id number] DOB: 03/30/1928 Today's Date: 06/25/2013   History of Present Illness  Pt admittted with acute repiratory failure with large pleural effusion.  Intubated 4/19 to 4/20, no with chest tube. Pt with PMH of CVAs with residual R side weakness, seizures, CAD and chronic back pain.  Clinical Impression  Pt admitted with above. Pt currently with functional limitations due to the deficits listed below (see PT Problem List). Pt will benefit from skilled PT to increase their independence and safety with mobility to allow discharge to the venue listed below.     Follow Up Recommendations Home health PT;Supervision/Assistance - 24 hour    Equipment Recommendations  None recommended by PT    Recommendations for Other Services       Precautions / Restrictions Precautions Precautions: Fall Precaution Comments: chest tube Restrictions Weight Bearing Restrictions: No      Mobility  Bed Mobility Overal bed mobility:  (pt up in chair)                Transfers Overall transfer level: Needs assistance Equipment used: Pushed w/c Transfers: Sit to/from Stand Sit to Stand: Min assist            Ambulation/Gait Ambulation/Gait assistance: Min assist Ambulation Distance (Feet): 200 Feet Assistive device:  (wheelchair) Gait Pattern/deviations: Step-to pattern;Trunk flexed;Wide base of support;Drifts right/left;Shuffle;Decreased stride length   Gait velocity interpretation: Below normal speed for age/gender General Gait Details: Pt needs cues for sequencing steps and wheelchair.  Pt needed assist steering wheelchair and cues for postural control.    Stairs            Wheelchair Mobility    Modified Rankin (Stroke Patients Only)       Balance Overall balance assessment: Needs assistance;History of Falls         Standing balance support: Single upper extremity supported;During functional  activity Standing balance-Leahy Scale: Poor Standing balance comment: Flexed posture throughout due to pt had previous back issues per pt.  Stood at sink and brushed teeth - see OT note.                               Pertinent Vitals/Pain Desat to 86 % on RA with activity ; replaced O2 at end of session, No pain     Home Living Family/patient expects to be discharged to:: Private residence Living Arrangements: Children (son ) Available Help at Discharge: Family;Available 24 hours/day Type of Home: House Home Access: Level entry     Home Layout: One level Home Equipment: Walker - 2 wheels;Shower seat;Cane - single point Additional Comments: PTA walked with single point cane and was not using O2    Prior Function Level of Independence: Needs assistance (used single point cane)   Gait / Transfers Assistance Needed: walks with cane in home, but reports he is unsteady  ADL's / Homemaking Assistance Needed: Son assists with LB dressing, cooking. Has a cleaning lady.        Hand Dominance   Dominant Hand: Right    Extremity/Trunk Assessment   Upper Extremity Assessment: Defer to OT evaluation RUE Deficits / Details: baseline incoordination, uses as lead   RUE Sensation: decreased light touch;decreased proprioception (unaware of toothpaste cap in his hand)     Lower Extremity Assessment: Generalized weakness      Cervical / Trunk Assessment: Kyphotic  Communication   Communication: Expressive difficulties  Cognition  Arousal/Alertness: Awake/alert Behavior During Therapy: WFL for tasks assessed/performed Overall Cognitive Status: Within Functional Limits for tasks assessed (slow processing, some inconsistencies with PLOF questions)                      General Comments      Exercises General Exercises - Lower Extremity Ankle Circles/Pumps: AROM;5 reps;Both;Seated Long Arc Quad: AROM;Both;10 reps;Seated      Assessment/Plan    PT Assessment  Patient needs continued PT services  PT Diagnosis Generalized weakness   PT Problem List Decreased activity tolerance;Decreased balance;Decreased mobility;Decreased knowledge of use of DME;Decreased safety awareness;Decreased knowledge of precautions  PT Treatment Interventions DME instruction;Gait training;Functional mobility training;Therapeutic activities;Therapeutic exercise;Balance training;Patient/family education   PT Goals (Current goals can be found in the Care Plan section) Acute Rehab PT Goals Patient Stated Goal: Home with son. PT Goal Formulation: With patient Time For Goal Achievement: 07/02/13 Potential to Achieve Goals: Good    Frequency Min 3X/week   Barriers to discharge        Co-evaluation   Reason for Co-Treatment: Other (comment);For patient/therapist safety (multiple lines)   OT goals addressed during session: ADL's and self-care       End of Session Equipment Utilized During Treatment: Gait belt;Oxygen Activity Tolerance: Patient limited by fatigue Patient left: in chair;with call bell/phone within reach Nurse Communication: Mobility status         Time: 5427-0623 PT Time Calculation (min): 27 min   Charges:   PT Evaluation $Initial PT Evaluation Tier I: 1 Procedure PT Treatments $Gait Training: 8-22 mins   PT G Codes:          Christianne Dolin Jul 20, 2013, 9:44 AM [facility] [phone] (pager)

## 2013-06-25 NOTE — Telephone Encounter (Signed)
Message copied by CAULFIELD, Eddie North on Wed Jun 25, 2013  2:12 PM ------      Message from: Simonne Maffucci B      Created: Wed Jun 25, 2013  1:35 PM       Hi All,            Please arrange outpatient pulmonary follow up for this patient with me in 2-3 months for pleural effusion            Thanks      B ------

## 2013-06-25 NOTE — Progress Notes (Signed)
PROGRESS NOTE    Terry Robertson UXN:[id number] DOB: September 18, 1928 DOA: 06/21/2013 PCP: [name], MD  HPI/Brief narrative Patient is a 80 year old man with a PMH of Stroke (08/2011, 03/2012); Seizures; PAF on coumadin; CAD S/P percutaneous coronary angioplasty (1997); chronic thrombocytopenia and Myelodysplastic syndrome, who presented with CAP initially. He was noted to have acute respiratory distress and large left pleural effusion, and was subsequently transferred to ICU for management.    Assessment/Plan:  1. Acute hypoxic respiratory failure: Secondary to large left pleural effusion (parapneumonic)/hemothorax -bloody due to warfarin. Managed by CCM in ICU. Status post extubation. Patient has left chest tube-management per CCM. Continue antibiotics and taper prednisone 2. Community acquired pneumonia with? Left parapneumonic effusion: Initially on vancomycin and Zosyn. Now on levofloxacin. Large left pleural effusion felt to be parapneumonic with hemorrhagic transformation secondary to Coumadin. DD includes pleural space malignancy. CVTS consulted >> no VATS for now, keep tube until no significant drainage, would not restart Coumadin until completely resolved because of risk of bleeding greater than stroke at this time and followup CT of chest in a month or so to be sure that everything clears up or decide on further diagnostic procedures are needed. 3. History of PAF/CAD/status post in 1997 and 100% RCA occlusion: Continue home dose Cardizem and atenolol. Troponins negative. 4. Acute renal failure: Resolved. 5. History of hematuria: Managed by urologist Dr. Harlow Asa. Outpatient followup with urology. 6. Acute post hemorrhagic anemia: Hemoglobin stable in the 7.5 g per DL range. Transfuse if hemoglobin less than 7 g per DL unless he has active symptoms/signs suggestive of cardiac ischemia then is less than 8 g per DL. 7. Myelodysplastic syndrome/chronic thrombocytopenia: Platelet counts  improving and stable. 8. Type II DM: SSI 9. History of seizure disorder/CVA: Continue Keppra and aspirin. 10. Chronic diastolic CHF: Compensated   Code Status: Full Family Communication: None at bedside Disposition Plan: Keep in step down unit   SIGNIFICANT EVENTS / STUDIES:  4/18 Admit for CAP  4/19 Transfer to ICU. Acute respiratory failure and intubated. Left chest tube insertion planned  4/19 FFP x 4 and Vit K 5 mg IV x 1 for INR reverse  4/19 Chest CT s/p chest tube>>>evacuation of most of the left pleural fluid, Residual consolidation or atelectasis in both lung bases, greater on the left. Cardiac enlargement.  4/20 CVL continuous oozing. PLT one unit given. PRBC x 1 unit  4/20 extubated  4/21 CVTS consult>>>concerns over malignancy possibility. No indication for VATSno coumadin for now.   LINES / TUBES:  4/19 left IJ>>>  4/19 OETT>>>4/20  4/19 OGTT>>> 4/20  4/19 Left chest tube>>>  4/19 Foley>>>   CULTURES:  4/18 blood>>> diphtheroids 1/2  4/18 urine>>> Neg  4/18 sputum>>>  4/18 Urine strep>>> Neg  4/18 urine Legionella>>>Neg  4/18 Lactic acid>>>0.6  4/19 PCT>>>1.16  4/19 Pleural fluid culture >>>  4/19 Pleural fluid cytology>>>NO MALIGNANT CELLS IDENTIFIED   ANTIBIOTICS:  Vancomycin 4/18>>> 4/21  Zosyn 4/18>>> 4/21  Levaquin 4/19>>>   Consultants:  CCM  CVTS   Subjective: Patient denies complaints. Denies dyspnea or chest pain.  Objective: Filed Vitals:   06/25/13 1200 06/25/13 1300 06/25/13 1400 06/25/13 1525  BP: 157/70 155/64 157/83 152/70  Pulse: 69 67 69 66  Temp:    97.8 F (36.6 C)  TempSrc:    Oral  Resp: 19 16 20 24   Height:      Weight:      SpO2: 100% 97% 97% 98%    Intake/Output  Summary (Last 24 hours) at 06/25/13 1618 Last data filed at 06/25/13 1530  Gross per 24 hour  Intake    790 ml  Output    615 ml  Net    175 ml   Filed Weights   06/21/13 2313 06/23/13 0500 06/24/13 0500  Weight: 113.807 kg (250 lb 14.4 oz) 114.3  kg (251 lb 15.8 oz) 112.2 kg (247 lb 5.7 oz)     Exam:  General exam: Pleasant elderly male sitting comfortably in chair. Respiratory system: Reduced breath sounds in left base with few crackles. Rest of lung fields clear to auscultation.. No increased work of breathing. Left-sided chest tube. Cardiovascular system: S1 & S2 heard, RRR. No JVD, murmurs, gallops, clicks or pedal edema. Telemetry: Sinus rhythm Gastrointestinal system: Abdomen is nondistended, soft and nontender. Normal bowel sounds heard. Central nervous system: Alert and oriented. No focal neurological deficits. Extremities: Symmetric 5 x 5 power.   Data Reviewed: Basic Metabolic Panel:  Recent Labs Lab 06/22/13 0500 06/22/13 1715 06/23/13 0835 06/24/13 0500 06/25/13 0500  NA 137 140 139 145 146  K 3.9 4.1 3.6* 3.6* 3.9  CL 102 101 104 108 111  CO2 21 22 22 23 25   GLUCOSE 147* 189* 177* 150* 141*  BUN 11 12 25* 26* 27*  CREATININE 0.98 1.44* 1.64* 1.16 0.94  CALCIUM 8.4 8.4 8.3* 8.4 8.6  MG  --  2.1 2.2  --  2.5  PHOS  --  4.2 2.7  --   --    Liver Function Tests:  Recent Labs Lab 06/21/13 1841 06/22/13 0500 06/22/13 1715  AST 20 21 25   ALT 12 13 17   ALKPHOS 53 51 54  BILITOT 0.5 0.6 0.6  PROT 9.5* 9.0* 8.7*  ALBUMIN 3.3* 3.0* 3.1*   No results found for this basename: LIPASE, AMYLASE,  in the last 168 hours No results found for this basename: AMMONIA,  in the last 168 hours CBC:  Recent Labs Lab 06/21/13 1841 06/22/13 1715  06/23/13 0835 06/23/13 1830 06/24/13 0102 06/24/13 0613 06/25/13 0500  WBC 6.5 9.8  < > 9.0 14.0* 12.9* 12.0* 8.6  NEUTROABS 3.1 8.2*  --   --   --   --   --   --   HGB 10.8* 7.9*  < > 7.3* 8.0* 7.8* 7.7* 7.6*  HCT 34.3* 26.0*  < > 23.9* 25.2* 24.7* 24.2* 24.6*  MCV 85.8 85.8  < > 85.4 84.8 84.3 84.6 85.1  PLT 87* 71*  < > 75* 114* 109* 111* 128*  < > = values in this interval not displayed. Cardiac Enzymes:  Recent Labs Lab 06/22/13 1720 06/22/13 2143  06/23/13 0835  TROPONINI <0.30 <0.30 <0.30   BNP (last 3 results)  Recent Labs  06/22/13 1720  PROBNP 7439.0*   CBG:  Recent Labs Lab 06/25/13 0015 06/25/13 0418 06/25/13 0747 06/25/13 1300 06/25/13 1540  GLUCAP 146* 130* 132* 196* 169*    Recent Results (from the past 240 hour(s))  CULTURE, BLOOD (ROUTINE X 2)     Status: None   Collection Time    06/21/13  6:41 PM      Result Value Ref Range Status   Specimen Description BLOOD LEFT ARM   Final   Special Requests BOTTLES DRAWN AEROBIC AND ANAEROBIC 5CC EA   Final   Culture  Setup Time     Final   Value: 06/22/2013 02:24     Performed at [facility]  Final   Value:        BLOOD CULTURE RECEIVED NO GROWTH TO DATE CULTURE WILL BE HELD FOR 5 DAYS BEFORE ISSUING A FINAL NEGATIVE REPORT     Performed at [facility]   Report Status PENDING   Incomplete  URINE CULTURE     Status: None   Collection Time    06/21/13  6:59 PM      Result Value Ref Range Status   Specimen Description URINE, CLEAN CATCH   Final   Special Requests NONE   Final   Culture  Setup Time     Final   Value: 06/22/2013 02:49     Performed at [facility]     Final   Value: 60,000 COLONIES/ML     Performed at [facility]   Culture     Final   Value: Multiple bacterial morphotypes present, none predominant. Suggest appropriate recollection if clinically indicated.     Performed at [facility]   Report Status 06/23/2013 FINAL   Final  CULTURE, BLOOD (ROUTINE X 2)     Status: None   Collection Time    06/21/13  8:34 PM      Result Value Ref Range Status   Specimen Description BLOOD RIGHT HAND   Final   Special Requests     Final   Value: BOTTLES DRAWN AEROBIC AND ANAEROBIC 10CC BLUE 5CC RED   Culture  Setup Time     Final   Value: 06/22/2013 02:24     Performed at [facility]   Culture     Final   Value: DIPHTHEROIDS(CORYNEBACTERIUM SPECIES)     Note: Standardized  susceptibility testing for this organism is not available.     Note: Gram Stain Report Called to,Read Back By and Verified With: MINDY HOPPER 06/24/13 AT 0650 RIDK     Performed at [facility]   Report Status 06/25/2013 FINAL   Final  BODY FLUID CULTURE     Status: None   Collection Time    06/22/13  7:34 PM      Result Value Ref Range Status   Specimen Description PLEURAL FLUID   Final   Special Requests NONE   Final   Gram Stain     Final   Value: WBC PRESENT, PREDOMINANTLY PMN     NO ORGANISMS SEEN     Performed at [facility]   Culture     Final   Value: NO GROWTH 2 DAYS     Performed at [facility]   Report Status PENDING   Incomplete  MRSA PCR SCREENING     Status: None   Collection Time    06/23/13 12:58 AM      Result Value Ref Range Status   MRSA by PCR NEGATIVE  NEGATIVE Final   Comment:            The GeneXpert MRSA Assay (FDA     approved for NASAL specimens     only), is one component of a     comprehensive MRSA colonization     surveillance program. It is not     intended to diagnose MRSA     infection nor to guide or     monitor treatment for     MRSA infections.        Studies: Dg Chest Port 1 View  06/25/2013   CLINICAL DATA:  Respiratory failure.  Chest tube.  EXAM: PORTABLE CHEST - 1 VIEW  COMPARISON:  06/24/2013  FINDINGS: Left jugular central venous catheter remains in place with tip overlying the central aspect of the innominate vein, unchanged. A left chest tube is unchanged. The cardiac silhouette remains enlarged. No pneumothorax is identified. Moderate left pleural effusion is unchanged. No right pleural effusion is seen. Patchy left perihilar opacity and more dense left basilar lung opacity do not appear significantly changed. Right lung remains clear.  IMPRESSION: 1. Unchanged appearance of support apparatus.  No pneumothorax. 2. Unchanged left pleural effusion and left lower lobe atelectasis.   Electronically Signed   By:  Logan Bores   On: 06/25/2013 07:57   Dg Chest Port 1 View  06/24/2013   CLINICAL DATA:  Pneumothorax  EXAM: PORTABLE CHEST - 1 VIEW  COMPARISON:  06/23/2013  FINDINGS: Left chest tube remains in good position. Moderate left effusion and left lower lobe atelectasis unchanged. No pneumothorax.  Endotracheal tube removed.  Left jugular catheter in the SVC.  Right lung remains clear.  IMPRESSION: Endotracheal tube removed.  No significant change in moderate left effusion and left lower lobe atelectasis. No pneumothorax. Left chest tube remains in place.   Electronically Signed   By: Franchot Gallo M.D.   On: 06/24/2013 07:14        Scheduled Meds: . antiseptic oral rinse  15 mL Mouth Rinse BID  . aspirin EC  81 mg Oral Daily  . atenolol  12.5 mg Oral Daily  . diltiazem  180 mg Oral Daily  . insulin aspart  1-3 Units Subcutaneous 6 times per day  . levETIRAcetam  500 mg Oral BID  . levofloxacin  750 mg Oral Daily  . [START ON 06/26/2013] predniSONE  60 mg Oral Q breakfast  . sodium chloride  3 mL Intravenous Q12H   Continuous Infusions:   Principal Problem:   Acute respiratory failure Active Problems:   CAD (coronary artery disease) PCI to circumflex in 1997.  100% occluded RCA   Myelodysplastic syndrome   HTN (hypertension), controlled   DM type 2 (diabetes mellitus, type 2)   Diastolic dysfunction: Grade 2   Thrombocytopenia, chronic   CVA (cerebral infarction)   Long term (current) use of anticoagulants   PAF (paroxysmal atrial fibrillation)   Seizure disorder   CAP (community acquired pneumonia)   Pneumonia    Time spent: 9 minutes    Modena Jansky, MD, FACP, [facility]. [facility] Pager [phone]  If 7PM-7AM, please contact night-coverage [email or web address] Password [email or web address] 06/25/2013, 4:18 PM    LOS: 4 days

## 2013-06-26 ENCOUNTER — Inpatient Hospital Stay (HOSPITAL_COMMUNITY): Payer: Medicare HMO

## 2013-06-26 DIAGNOSIS — D649 Anemia, unspecified: Secondary | ICD-10-CM

## 2013-06-26 LAB — GLUCOSE, CAPILLARY
GLUCOSE-CAPILLARY: 210 mg/dL — AB (ref 70–99)
GLUCOSE-CAPILLARY: 161 mg/dL — AB (ref 70–99)
Glucose-Capillary: 136 mg/dL — ABNORMAL HIGH (ref 70–99)
Glucose-Capillary: 217 mg/dL — ABNORMAL HIGH (ref 70–99)

## 2013-06-26 LAB — CBC
HCT: 25.3 % — ABNORMAL LOW (ref 39.0–52.0)
HEMOGLOBIN: 8 g/dL — AB (ref 13.0–17.0)
MCH: 27.1 pg (ref 26.0–34.0)
MCHC: 31.6 g/dL (ref 30.0–36.0)
MCV: 85.8 fL (ref 78.0–100.0)
PLATELETS: 139 10*3/uL — AB (ref 150–400)
RBC: 2.95 MIL/uL — ABNORMAL LOW (ref 4.22–5.81)
RDW: 15.7 % — ABNORMAL HIGH (ref 11.5–15.5)
WBC: 6.2 10*3/uL (ref 4.0–10.5)

## 2013-06-26 LAB — BODY FLUID CULTURE: Culture: NO GROWTH

## 2013-06-26 LAB — BASIC METABOLIC PANEL
BUN: 25 mg/dL — AB (ref 6–23)
CALCIUM: 8.7 mg/dL (ref 8.4–10.5)
CO2: 26 mEq/L (ref 19–32)
Chloride: 108 mEq/L (ref 96–112)
Creatinine, Ser: 0.92 mg/dL (ref 0.50–1.35)
GFR calc Af Amer: 89 mL/min — ABNORMAL LOW (ref >90)
GFR calc non Af Amer: 76 mL/min — ABNORMAL LOW (ref >90)
GLUCOSE: 143 mg/dL — AB (ref 70–99)
Potassium: 4.2 mEq/L (ref 3.7–5.3)
Sodium: 144 mEq/L (ref 137–147)

## 2013-06-26 NOTE — Progress Notes (Signed)
[facility] PCCM  S:  Feeling better  O:  Filed Vitals:   06/26/13 1100 06/26/13 1200 06/26/13 1300 06/26/13 1400  BP: 147/64 156/64 151/61 151/97  Pulse: 65 69 63 71  Temp:  98 F (36.7 C)    TempSrc:  Oral    Resp: 29 16 15 20   Height:      Weight:      SpO2: 98% 97% 98% 99%   Chest tube output 15cc  Gen: sitting up in chair, no acute distress HEENT: NCAT, EOMi, OP clear, PULM: CTA B CV: irreg irreg, no mgr, no JVD AB: BS+, soft, nontender, no hsm Ext: warm, trace edema, no clubbing, no cyanosis   Impression: 1) Hemothorax> parapneumonic effusion, bloody due to warfarin 2) Pleural thickening due to asbestos exposure 3) Severe CAP  Plan: 1) d/c chest tube 2) Continue to hold warfarin indefinitely 3) Complete 7 day course of antibiotics 4) f/u with pulmonary medicine as outpatient  PCCM to sign off, call if questions  Roselie Awkward, MD [place] [facility] Pager: [phone] Cell: [phone] If no response, call [phone]

## 2013-06-26 NOTE — Progress Notes (Signed)
Occupational Therapy Treatment Patient Details Name: Terry Robertson MRN: [id number] DOB: 1928/04/11 Today's Date: 06/26/2013    History of present illness Pt admittted with acute repiratory failure with large pleural effusion.  Intubated 4/19 to 4/20, no with chest tube. Pt with PMH of CVAs with residual R side weakness, seizures, CAD and chronic back pain.   OT comments  Pt progress with therapy this session and completed UB bathing with grooming task. Pt continues to require rest breaks and incr time.    Follow Up Recommendations  Home health OT;Supervision/Assistance - 24 hour    Equipment Recommendations  None recommended by OT    Recommendations for Other Services      Precautions / Restrictions Precautions Precautions: Fall Precaution Comments: chest tube       Mobility Bed Mobility Overal bed mobility: Needs Assistance Bed Mobility: Supine to Sit     Supine to sit: Min guard;HOB elevated     General bed mobility comments: HOB elevated at this time due to chest tube  Transfers Overall transfer level: Needs assistance Equipment used: 1 person hand held assist Transfers: Sit to/from Stand Sit to Stand: Min assist              Balance                                   ADL       Grooming: Oral care;Wash/dry face;Min guard;Standing Grooming Details (indicate cue type and reason): stood for ~5 minutes Upper Body Bathing: Set up;Sitting       Upper Body Dressing : Minimal assistance;Sitting ((A) with gown and lines)       Toilet Transfer: Minimal assistance;Ambulation           Functional mobility during ADLs: Minimal assistance (hand held (A)) General ADL Comments:  pt supine on arrival and noted to have wet linen from bladder voids. Question spillage of urinal v/s incontinence. Pt with urinal present but empty at this time. Pt progressed to EOB and then to sink level for grooming standing. Pt returned to sitting for UB bathing and  peri care. pt completed sit<>Stand x3 this session with heavy use of BIL LE      Vision                     Perception     Praxis      Cognition   Behavior During Therapy: WFL for tasks assessed/performed Overall Cognitive Status: Within Functional Limits for tasks assessed                       Extremity/Trunk Assessment               Exercises     Shoulder Instructions       General Comments      Pertinent Vitals/ Pain       None reported Oxygen on RA during session 90% or better  Home Living                                          Prior Functioning/Environment              Frequency Min 2X/week     Progress Toward Goals  OT Goals(current goals can now be found in the care  plan section)  Progress towards OT goals: Progressing toward goals  Acute Rehab OT Goals Patient Stated Goal: Home with son. OT Goal Formulation: With patient Time For Goal Achievement: 07/02/13 Potential to Achieve Goals: Good ADL Goals Pt Will Perform Grooming: with supervision;standing Pt Will Perform Upper Body Bathing: with supervision;sitting Pt Will Perform Upper Body Dressing: with supervision;sitting Pt Will Transfer to Toilet: with supervision;ambulating Pt Will Perform Toileting - Clothing Manipulation and hygiene: with supervision;sit to/from stand Pt Will Perform Tub/Shower Transfer: with supervision;ambulating;shower seat;rolling walker  Plan Discharge plan remains appropriate    Co-evaluation                 End of Session     Activity Tolerance Patient tolerated treatment well   Patient Left in chair;with call bell/phone within reach   Nurse Communication Mobility status;Precautions        Time: 1010-1036 OT Time Calculation (min): 26 min  Charges: OT General Charges $OT Visit: 1 Procedure OT Treatments $Self Care/Home Management : 23-37 mins  Peri Maris 06/26/2013, 12:06 PM Pager:  [phone]

## 2013-06-26 NOTE — Progress Notes (Addendum)
PROGRESS NOTE    Terry Robertson PJK:[id number] DOB: Apr 04, 1928 DOA: 06/21/2013 PCP: [name], MD  HPI/Brief narrative Patient is a 80 year old man with a PMH of Stroke (08/2011, 03/2012); Seizures; PAF on coumadin; CAD S/P percutaneous coronary angioplasty (1997); chronic thrombocytopenia and Myelodysplastic syndrome, who presented with CAP initially. He was noted to have acute respiratory distress and large left pleural effusion, and was subsequently transferred to ICU for management. Status post VDRF and left chest tube. Respiratory status stable. Care transferred to [facility] on 4/22. CCM follows in consult.   Assessment/Plan:  1. Acute hypoxic respiratory failure: Secondary to large left pleural effusion (parapneumonic)/hemothorax -bloody due to warfarin. Managed by CCM in ICU. Status post extubation. Patient has left chest tube-management per CCM. Continue antibiotics and taper prednisone 2. Community acquired pneumonia with Left Hemothorax(suspected parapneumonic with hemorrhagic transformation secondary to Coumadin): Initially on vancomycin and Zosyn. Now on levofloxacin. Large left pleural effusion felt to be parapneumonic with hemorrhagic transformation secondary to Coumadin. DD includes pleural space malignancy. CVTS consulted >> no VATS for now, keep tube until no significant drainage, would not restart Coumadin until completely resolved because of risk of bleeding greater than stroke at this time and followup CT of chest in a month or so to be sure that everything clears up or decide on further diagnostic procedures are needed. Chest x-ray 4/23-pending. 3. History of PAF/CAD/status post in 1997 and 100% RCA occlusion: Continue home dose Cardizem and atenolol. Troponins negative. 4. Acute renal failure: Resolved. 5. History of hematuria: Managed by urologist Dr. Harlow Asa. Outpatient followup with urology. 6. Acute post hemorrhagic anemia: Hemoglobin stable in the 7.5 g per DL range.  Transfuse if hemoglobin less than 7 g per DL unless he has active symptoms/signs suggestive of cardiac ischemia then if less than 8 g per DL. 7. Myelodysplastic syndrome/chronic thrombocytopenia: Platelet counts improving and stable. 8. Type II DM: SSI 9. History of seizure disorder/CVA: Continue Keppra and aspirin. 10. Chronic diastolic CHF: Compensated   Code Status: Full Family Communication: None at bedside Disposition Plan: Chest tube being removed 4/23 >transfer to telemetry after that.   SIGNIFICANT EVENTS / STUDIES:  4/18 Admit for CAP  4/19 Transfer to ICU. Acute respiratory failure and intubated. Left chest tube insertion planned  4/19 FFP x 4 and Vit K 5 mg IV x 1 for INR reverse  4/19 Chest CT s/p chest tube>>>evacuation of most of the left pleural fluid, Residual consolidation or atelectasis in both lung bases, greater on the left. Cardiac enlargement.  4/20 CVL continuous oozing. PLT one unit given. PRBC x 1 unit  4/20 extubated  4/21 CVTS consult>>>concerns over malignancy possibility. No indication for VATSno coumadin for now.   LINES / TUBES:  4/19 left IJ>>>  4/19 OETT>>>4/20  4/19 OGTT>>> 4/20  4/19 Left chest tube>>>  4/19 Foley>>>   CULTURES:  4/18 blood>>> diphtheroids 1/2  4/18 urine>>> Neg  4/18 sputum>>>  4/18 Urine strep>>> Neg  4/18 urine Legionella>>>Neg  4/18 Lactic acid>>>0.6  4/19 PCT>>>1.16  4/19 Pleural fluid culture >>>  4/19 Pleural fluid cytology>>>NO MALIGNANT CELLS IDENTIFIED   ANTIBIOTICS:  Vancomycin 4/18>>> 4/21  Zosyn 4/18>>> 4/21  Levaquin 4/19>>>   Consultants:  CCM  CVTS   Subjective: Patient denies complaints. Denies dyspnea or chest pain.  Objective: Filed Vitals:   06/26/13 0600 06/26/13 0700 06/26/13 0800 06/26/13 0900  BP: 164/50 157/72 147/75 155/73  Pulse: 68 67 62 60  Temp:  98.1 F (36.7 C)  TempSrc:  Oral    Resp: 23 21 21 25   Height:      Weight:      SpO2: 99% 98% 97% 97%    Intake/Output  Summary (Last 24 hours) at 06/26/13 1133 Last data filed at 06/26/13 1000  Gross per 24 hour  Intake    743 ml  Output   1405 ml  Net   -662 ml   Filed Weights   06/24/13 0500 06/25/13 2200 06/26/13 0429  Weight: 112.2 kg (247 lb 5.7 oz) 114 kg (251 lb 5.2 oz) 114 kg (251 lb 5.2 oz)     Exam:  General exam: Pleasant elderly male sitting comfortably in chair. Respiratory system: Reduced breath sounds in left base with few crackles- better than 4/22. Rest of lung fields clear to auscultation.. No increased work of breathing. Left-sided chest tube- minimal bloody drainage (no output documented since 4/22). Cardiovascular system: S1 & S2 heard, RRR. No JVD, murmurs, gallops, clicks or pedal edema.  Gastrointestinal system: Abdomen is nondistended, soft and nontender. Normal bowel sounds heard. Central nervous system: Alert and oriented. No focal neurological deficits. Extremities: Symmetric 5 x 5 power.   Data Reviewed: Basic Metabolic Panel:  Recent Labs Lab 06/22/13 1715 06/23/13 0835 06/24/13 0500 06/25/13 0500 06/26/13 0245  NA 140 139 145 146 144  K 4.1 3.6* 3.6* 3.9 4.2  CL 101 104 108 111 108  CO2 22 22 23 25 26   GLUCOSE 189* 177* 150* 141* 143*  BUN 12 25* 26* 27* 25*  CREATININE 1.44* 1.64* 1.16 0.94 0.92  CALCIUM 8.4 8.3* 8.4 8.6 8.7  MG 2.1 2.2  --  2.5  --   PHOS 4.2 2.7  --   --   --    Liver Function Tests:  Recent Labs Lab 06/21/13 1841 06/22/13 0500 06/22/13 1715  AST 20 21 25   ALT 12 13 17   ALKPHOS 53 51 54  BILITOT 0.5 0.6 0.6  PROT 9.5* 9.0* 8.7*  ALBUMIN 3.3* 3.0* 3.1*   No results found for this basename: LIPASE, AMYLASE,  in the last 168 hours No results found for this basename: AMMONIA,  in the last 168 hours CBC:  Recent Labs Lab 06/21/13 1841 06/22/13 1715  06/23/13 1830 06/24/13 0102 06/24/13 0613 06/25/13 0500 06/26/13 0245  WBC 6.5 9.8  < > 14.0* 12.9* 12.0* 8.6 6.2  NEUTROABS 3.1 8.2*  --   --   --   --   --   --   HGB  10.8* 7.9*  < > 8.0* 7.8* 7.7* 7.6* 8.0*  HCT 34.3* 26.0*  < > 25.2* 24.7* 24.2* 24.6* 25.3*  MCV 85.8 85.8  < > 84.8 84.3 84.6 85.1 85.8  PLT 87* 71*  < > 114* 109* 111* 128* 139*  < > = values in this interval not displayed. Cardiac Enzymes:  Recent Labs Lab 06/22/13 1720 06/22/13 2143 06/23/13 0835  TROPONINI <0.30 <0.30 <0.30   BNP (last 3 results)  Recent Labs  06/22/13 1720  PROBNP 7439.0*   CBG:  Recent Labs Lab 06/25/13 0747 06/25/13 1300 06/25/13 1540 06/25/13 1920 06/26/13 0830  GLUCAP 132* 196* 169* 152* 136*    Recent Results (from the past 240 hour(s))  CULTURE, BLOOD (ROUTINE X 2)     Status: None   Collection Time    06/21/13  6:41 PM      Result Value Ref Range Status   Specimen Description BLOOD LEFT ARM   Final   Special Requests  BOTTLES DRAWN AEROBIC AND ANAEROBIC 5CC EA   Final   Culture  Setup Time     Final   Value: 06/22/2013 02:24     Performed at [facility]   Culture     Final   Value:        BLOOD CULTURE RECEIVED NO GROWTH TO DATE CULTURE WILL BE HELD FOR 5 DAYS BEFORE ISSUING A FINAL NEGATIVE REPORT     Performed at [facility]   Report Status PENDING   Incomplete  URINE CULTURE     Status: None   Collection Time    06/21/13  6:59 PM      Result Value Ref Range Status   Specimen Description URINE, CLEAN CATCH   Final   Special Requests NONE   Final   Culture  Setup Time     Final   Value: 06/22/2013 02:49     Performed at [place]     Final   Value: 60,000 COLONIES/ML     Performed at [facility]   Culture     Final   Value: Multiple bacterial morphotypes present, none predominant. Suggest appropriate recollection if clinically indicated.     Performed at [facility]   Report Status 06/23/2013 FINAL   Final  CULTURE, BLOOD (ROUTINE X 2)     Status: None   Collection Time    06/21/13  8:34 PM      Result Value Ref Range Status   Specimen Description BLOOD RIGHT  HAND   Final   Special Requests     Final   Value: BOTTLES DRAWN AEROBIC AND ANAEROBIC 10CC BLUE 5CC RED   Culture  Setup Time     Final   Value: 06/22/2013 02:24     Performed at [facility]   Culture     Final   Value: DIPHTHEROIDS(CORYNEBACTERIUM SPECIES)     Note: Standardized susceptibility testing for this organism is not available.     Note: Gram Stain Report Called to,Read Back By and Verified With: MINDY HOPPER 06/24/13 AT 0650 RIDK     Performed at [facility]   Report Status 06/25/2013 FINAL   Final  BODY FLUID CULTURE     Status: None   Collection Time    06/22/13  7:34 PM      Result Value Ref Range Status   Specimen Description PLEURAL FLUID   Final   Special Requests NONE   Final   Gram Stain     Final   Value: WBC PRESENT, PREDOMINANTLY PMN     NO ORGANISMS SEEN     Performed at [facility]   Culture     Final   Value: NO GROWTH 3 DAYS     Performed at [facility]   Report Status 06/26/2013 FINAL   Final  MRSA PCR SCREENING     Status: None   Collection Time    06/23/13 12:58 AM      Result Value Ref Range Status   MRSA by PCR NEGATIVE  NEGATIVE Final   Comment:            The GeneXpert MRSA Assay (FDA     approved for NASAL specimens     only), is one component of a     comprehensive MRSA colonization     surveillance program. It is not     intended to diagnose MRSA  infection nor to guide or     monitor treatment for     MRSA infections.        Studies: Dg Chest Port 1 View  06/25/2013   CLINICAL DATA:  Respiratory failure.  Chest tube.  EXAM: PORTABLE CHEST - 1 VIEW  COMPARISON:  06/24/2013  FINDINGS: Left jugular central venous catheter remains in place with tip overlying the central aspect of the innominate vein, unchanged. A left chest tube is unchanged. The cardiac silhouette remains enlarged. No pneumothorax is identified. Moderate left pleural effusion is unchanged. No right pleural effusion is seen. Patchy  left perihilar opacity and more dense left basilar lung opacity do not appear significantly changed. Right lung remains clear.  IMPRESSION: 1. Unchanged appearance of support apparatus.  No pneumothorax. 2. Unchanged left pleural effusion and left lower lobe atelectasis.   Electronically Signed   By: Logan Bores   On: 06/25/2013 07:57        Scheduled Meds: . antiseptic oral rinse  15 mL Mouth Rinse BID  . aspirin EC  81 mg Oral Daily  . atenolol  12.5 mg Oral Daily  . diltiazem  180 mg Oral Daily  . insulin aspart  0-9 Units Subcutaneous TID WC  . levETIRAcetam  500 mg Oral BID  . levofloxacin  750 mg Oral Daily  . predniSONE  60 mg Oral Q breakfast  . sodium chloride  3 mL Intravenous Q12H   Continuous Infusions:   Principal Problem:   Acute respiratory failure Active Problems:   CAD (coronary artery disease) PCI to circumflex in 1997.  100% occluded RCA   Myelodysplastic syndrome   HTN (hypertension), controlled   DM type 2 (diabetes mellitus, type 2)   Diastolic dysfunction: Grade 2   Thrombocytopenia, chronic   CVA (cerebral infarction)   Long term (current) use of anticoagulants   PAF (paroxysmal atrial fibrillation)   Seizure disorder   CAP (community acquired pneumonia)   Pneumonia   Hemothorax, left    Time spent: 37 minutes    Modena Jansky, MD, FACP, [facility]. [facility] Pager [phone]  If 7PM-7AM, please contact night-coverage [email or web address] Password [facility] 06/26/2013, 11:33 AM    LOS: 5 days

## 2013-06-26 NOTE — Significant Event (Addendum)
Left 32 fr chest removed without problem. Note large clot in chest tube. Sterile occlusive dressing with Vaseline gauze applied. Sutures x 2 remain to approximate wound. Portable chest x ray ordered. Temp:  [97.7 F (36.5 C)-98.1 F (36.7 C)] 98 F (36.7 C) (04/23 1200) Pulse Rate:  [56-71] 69 (04/23 1200) Resp:  [16-30] 16 (04/23 1200) BP: (133-167)/(50-83) 156/64 mmHg (04/23 1200) SpO2:  [95 %-99 %] 97 % (04/23 1200) Weight:  [114 kg (251 lb 5.2 oz)] 114 kg (251 lb 5.2 oz) (04/23 0429) Dg Chest Port 1 View  06/25/2013   CLINICAL DATA:  Respiratory failure.  Chest tube.  EXAM: PORTABLE CHEST - 1 VIEW  COMPARISON:  06/24/2013  FINDINGS: Left jugular central venous catheter remains in place with tip overlying the central aspect of the innominate vein, unchanged. A left chest tube is unchanged. The cardiac silhouette remains enlarged. No pneumothorax is identified. Moderate left pleural effusion is unchanged. No right pleural effusion is seen. Patchy left perihilar opacity and more dense left basilar lung opacity do not appear significantly changed. Right lung remains clear.  IMPRESSION: 1. Unchanged appearance of support apparatus.  No pneumothorax. 2. Unchanged left pleural effusion and left lower lobe atelectasis.   Electronically Signed   By: Logan Bores   On: 06/25/2013 07:57   Richardson Landry Minor ACNP Maryanna Shape PCCM Pager [phone] till 3 pm If no answer page [phone] 06/26/2013, 1:32 PM

## 2013-06-27 ENCOUNTER — Inpatient Hospital Stay (HOSPITAL_COMMUNITY): Payer: Medicare HMO

## 2013-06-27 LAB — CBC
HCT: 26.2 % — ABNORMAL LOW (ref 39.0–52.0)
Hemoglobin: 8.2 g/dL — ABNORMAL LOW (ref 13.0–17.0)
MCH: 26.7 pg (ref 26.0–34.0)
MCHC: 31.3 g/dL (ref 30.0–36.0)
MCV: 85.3 fL (ref 78.0–100.0)
Platelets: 130 10*3/uL — ABNORMAL LOW (ref 150–400)
RBC: 3.07 MIL/uL — ABNORMAL LOW (ref 4.22–5.81)
RDW: 15.6 % — AB (ref 11.5–15.5)
WBC: 4.9 10*3/uL (ref 4.0–10.5)

## 2013-06-27 LAB — GLUCOSE, CAPILLARY
Glucose-Capillary: 105 mg/dL — ABNORMAL HIGH (ref 70–99)
Glucose-Capillary: 117 mg/dL — ABNORMAL HIGH (ref 70–99)
Glucose-Capillary: 146 mg/dL — ABNORMAL HIGH (ref 70–99)
Glucose-Capillary: 139 mg/dL — ABNORMAL HIGH (ref 70–99)

## 2013-06-27 LAB — OTHER BODY FLUID CHEMISTRY

## 2013-06-27 NOTE — Progress Notes (Signed)
Physical Therapy Treatment Patient Details Name: Becket Wecker MRN: [id number] DOB: [birth year] Today's Date: 06/27/2013    History of Present Illness Pt admittted with acute repiratory failure with large pleural effusion.  Intubated 4/19 to 4/20, no with chest tube. Pt with PMH of CVAs with residual R side weakness, seizures, CAD and chronic back pain.    PT Comments    Pt progressing with gait with sats 89-90% on RA with gait with drop to 87% on stairs. Pt educated for HEP and RW use and encouraged continued use as well as checking sats periodically. Will continue to follow.   Follow Up Recommendations  Home health PT;Supervision for mobility/OOB     Equipment Recommendations       Recommendations for Other Services       Precautions / Restrictions Precautions Precautions: Fall    Mobility  Bed Mobility                  Transfers Overall transfer level: Needs assistance   Transfers: Sit to/from Stand Sit to Stand: Supervision         General transfer comment: cues for hand placement  Ambulation/Gait Ambulation/Gait assistance: Supervision Ambulation Distance (Feet): 300 Feet Assistive device: Rolling walker (2 wheeled) Gait Pattern/deviations: Step-through pattern;Trunk flexed;Decreased stride length     General Gait Details: cues for position in RW with maintained flexion and cues for breathing technique   Stairs Stairs: Yes Stairs assistance: Modified independent (Device/Increase time) Stair Management: Two rails;Alternating pattern;Forwards Number of Stairs: 11 General stair comments: requested pt practice 1 step for entry into home but he did full flight despite cues with sats dropping to 86% with stairs and need for 2 min standing rest with pursed lip breathing to recover to 90%  Wheelchair Mobility    Modified Rankin (Stroke Patients Only)       Balance                                    Cognition Arousal/Alertness:  Awake/alert Behavior During Therapy: WFL for tasks assessed/performed Overall Cognitive Status: Within Functional Limits for tasks assessed                      Exercises General Exercises - Lower Extremity Long Arc Quad: AROM;Seated;Both;15 reps Hip ABduction/ADduction: AROM;Seated;Both;15 reps Hip Flexion/Marching: AROM;Seated;Both;15 reps Toe Raises: AROM;Seated;Both;15 reps Heel Raises: AROM;Seated;Both;15 reps    General Comments        Pertinent Vitals/Pain No pain HR 87    Home Living                      Prior Function            PT Goals (current goals can now be found in the care plan section) Progress towards PT goals: Progressing toward goals    Frequency       PT Plan Current plan remains appropriate    Co-evaluation             End of Session   Activity Tolerance: Patient tolerated treatment well Patient left: in chair;with call bell/phone within reach     Time: 1117-1140 PT Time Calculation (min): 23 min  Charges:  $Gait Training: 8-22 mins $Therapeutic Exercise: 8-22 mins                    G Codes:      Maleiah Dula  B Vinicius Brockman 06/27/2013, 11:47 AM Elwyn Reach, [place]

## 2013-06-27 NOTE — Progress Notes (Signed)
Patient ID: Terry Robertson  male  [id number]    DOB: Jan 02, 1929    DOA: 06/21/2013  PCP: [name], MD  HPI/Brief narrative  Patient is a 80 year old man with a PMH of Stroke (08/2011, 03/2012); Seizures; PAF on coumadin; CAD S/P percutaneous coronary angioplasty (1997); chronic thrombocytopenia and Myelodysplastic syndrome, who presented with CAP initially. He was noted to have acute respiratory distress and large left pleural effusion, and was subsequently transferred to ICU for management. Status post VDRF and left chest tube. Respiratory status stable. Care transferred to [facility] on 4/22. Chest tube removed on 06/26/13    Assessment/Plan: Acute hypoxic respiratory failure resolved - Secondary to large left pleural effusion, parapneumonic/hemothorax bloody due to warfarin - Status post extubation, left chest tube removed yesterday her for 23 2015 - Continue antibiotics for 7 days, taper prednisone   Community acquired pneumonia with Left Hemothorax(suspected parapneumonic with hemorrhagic transformation secondary to Coumadin - Initially on vancomycin and Zosyn, currently on levofloxacin - CT surgery, Dr. Cyndia Bent was consulted and recommended no VATS for now,keep tube until no significant drainage, would not restart Coumadin until completely resolved because of risk of bleeding greater than stroke at this time and followup CT of chest in a month or so to be sure that everything clears up or decide on further diagnostic procedures are needed.  -Per pulmonary critical care, continue to hold warfarin indefinitely, followup with pulmonary medicine as outpatient   History of PAF/CAD/status post in 1997 and 100% RCA occlusion: -  Continue home dose Cardizem and atenolol. Troponins negative.  Acute renal failure: Resolved.  History of hematuria: Managed by urologist Dr. Harlow Asa. Outpatient followup with urology  Acute post hemorrhagic anemia:  - Transfuse if hemoglobin less than  8  Myelodysplastic syndrome/chronic thrombocytopenia: Platelet counts improving and stable.  Type II DM: SSI  History of seizure disorder/CVA:  - Continue Keppra and aspirin.  Chronic diastolic CHF: Compensated  DVT Prophylaxis: SCDs  Code Status: Full code  Family Communication:  Disposition: Start physical therapy, hopefully DC tomorrow a.m.  Consultants:  Pulmonology  CT surgery  Procedures: SIGNIFICANT EVENTS / STUDIES:  4/18 Admit for CAP  4/19 Transfer to ICU. Acute respiratory failure and intubated. Left chest tube insertion planned  4/19 FFP x 4 and Vit K 5 mg IV x 1 for INR reverse  4/19 Chest CT s/p chest tube>>>evacuation of most of the left pleural fluid, Residual consolidation or atelectasis in both lung bases, greater on the left. Cardiac enlargement.  4/20 CVL continuous oozing. PLT one unit given. PRBC x 1 unit  4/20 extubated  4/21 CVTS consult>>>concerns over malignancy possibility. No indication for VATSno coumadin for now.    Antibiotics: Vancomycin 4/18>>> 4/21  Zosyn 4/18>>> 4/21  Levaquin 4/19>>>   Subjective: Feeling better, denies any specific complaints this morning, no fever chills or any chest pain  Objective: Weight change: -0.102 kg (-3.6 oz)  Intake/Output Summary (Last 24 hours) at 06/27/13 1304 Last data filed at 06/26/13 2301  Gross per 24 hour  Intake    370 ml  Output    625 ml  Net   -255 ml   Blood pressure 161/66, pulse 87, temperature 97.8 F (36.6 C), temperature source Oral, resp. rate 18, height 5\' 5"  (1.651 m), weight 113.898 kg (251 lb 1.6 oz), SpO2 90.00%.  Physical Exam: General: Alert and awake, oriented x3, not in any acute distress. CVS: S1-S2 clear, no murmur rubs or gallops Chest: Reduced breath sound at the bases  Abdomen: soft nontender, nondistended, normal bowel sounds  Extremities: no cyanosis, clubbing or edema noted bilaterally Neuro: Cranial nerves II-XII intact, no focal neurological  deficits  Lab Results: Basic Metabolic Panel:  Recent Labs Lab 06/23/13 0835  06/25/13 0500 06/26/13 0245  NA 139  < > 146 144  K 3.6*  < > 3.9 4.2  CL 104  < > 111 108  CO2 22  < > 25 26  GLUCOSE 177*  < > 141* 143*  BUN 25*  < > 27* 25*  CREATININE 1.64*  < > 0.94 0.92  CALCIUM 8.3*  < > 8.6 8.7  MG 2.2  --  2.5  --   PHOS 2.7  --   --   --   < > = values in this interval not displayed. Liver Function Tests:  Recent Labs Lab 06/22/13 0500 06/22/13 1715  AST 21 25  ALT 13 17  ALKPHOS 51 54  BILITOT 0.6 0.6  PROT 9.0* 8.7*  ALBUMIN 3.0* 3.1*   No results found for this basename: LIPASE, AMYLASE,  in the last 168 hours No results found for this basename: AMMONIA,  in the last 168 hours CBC:  Recent Labs Lab 06/22/13 1715  06/26/13 0245 06/27/13 0534  WBC 9.8  < > 6.2 4.9  NEUTROABS 8.2*  --   --   --   HGB 7.9*  < > 8.0* 8.2*  HCT 26.0*  < > 25.3* 26.2*  MCV 85.8  < > 85.8 85.3  PLT 71*  < > 139* 130*  < > = values in this interval not displayed. Cardiac Enzymes:  Recent Labs Lab 06/22/13 1720 06/22/13 2143 06/23/13 0835  TROPONINI <0.30 <0.30 <0.30   BNP: No components found with this basename: POCBNP,  CBG:  Recent Labs Lab 06/26/13 1133 06/26/13 1655 06/26/13 2023 06/27/13 0720 06/27/13 1138  GLUCAP 217* 210* 161* 105* 117*     Micro Results: Recent Results (from the past 240 hour(s))  CULTURE, BLOOD (ROUTINE X 2)     Status: None   Collection Time    06/21/13  6:41 PM      Result Value Ref Range Status   Specimen Description BLOOD LEFT ARM   Final   Special Requests BOTTLES DRAWN AEROBIC AND ANAEROBIC 5CC EA   Final   Culture  Setup Time     Final   Value: 06/22/2013 02:24     Performed at [facility]   Culture     Final   Value:        BLOOD CULTURE RECEIVED NO GROWTH TO DATE CULTURE WILL BE HELD FOR 5 DAYS BEFORE ISSUING A FINAL NEGATIVE REPORT     Performed at [facility]   Report Status PENDING    Incomplete  URINE CULTURE     Status: None   Collection Time    06/21/13  6:59 PM      Result Value Ref Range Status   Specimen Description URINE, CLEAN CATCH   Final   Special Requests NONE   Final   Culture  Setup Time     Final   Value: 06/22/2013 02:49     Performed at [place]     Final   Value: 60,000 COLONIES/ML     Performed at [facility]   Culture     Final   Value: Multiple bacterial morphotypes present, none predominant. Suggest appropriate recollection if clinically indicated.  Performed at [facility]   Report Status 06/23/2013 FINAL   Final  CULTURE, BLOOD (ROUTINE X 2)     Status: None   Collection Time    06/21/13  8:34 PM      Result Value Ref Range Status   Specimen Description BLOOD RIGHT HAND   Final   Special Requests     Final   Value: BOTTLES DRAWN AEROBIC AND ANAEROBIC 10CC BLUE 5CC RED   Culture  Setup Time     Final   Value: 06/22/2013 02:24     Performed at [facility]   Culture     Final   Value: DIPHTHEROIDS(CORYNEBACTERIUM SPECIES)     Note: Standardized susceptibility testing for this organism is not available.     Note: Gram Stain Report Called to,Read Back By and Verified With: MINDY HOPPER 06/24/13 AT 0650 RIDK     Performed at [facility]   Report Status 06/25/2013 FINAL   Final  BODY FLUID CULTURE     Status: None   Collection Time    06/22/13  7:34 PM      Result Value Ref Range Status   Specimen Description PLEURAL FLUID   Final   Special Requests NONE   Final   Gram Stain     Final   Value: WBC PRESENT, PREDOMINANTLY PMN     NO ORGANISMS SEEN     Performed at [facility]   Culture     Final   Value: NO GROWTH 3 DAYS     Performed at [facility]   Report Status 06/26/2013 FINAL   Final  MRSA PCR SCREENING     Status: None   Collection Time    06/23/13 12:58 AM      Result Value Ref Range Status   MRSA by PCR NEGATIVE  NEGATIVE Final   Comment:             The GeneXpert MRSA Assay (FDA     approved for NASAL specimens     only), is one component of a     comprehensive MRSA colonization     surveillance program. It is not     intended to diagnose MRSA     infection nor to guide or     monitor treatment for     MRSA infections.    Studies/Results: Ct Chest Wo Contrast  06/23/2013   CLINICAL DATA:  Empyema with chest tube.  EXAM: CT CHEST WITHOUT CONTRAST  TECHNIQUE: Multidetector CT imaging of the chest was performed following the standard protocol without IV contrast.  COMPARISON:  DG CHEST 1V PORT dated 06/22/2013; CT CHEST W/CM dated 06/22/2013  FINDINGS: Since the prior study, there is been interval placement of a left chest tube with significant evacuation of previous left pleural effusion. There is residual consolidation and atelectasis in the left lung. Mild subcutaneous emphysema in the left chest. Endotracheal and enteric tubes are in place. Left central venous catheter with tip in the brachiocephalic vein. Cardiac enlargement with Coronary artery calcifications. Calcification of thoracic aorta without aneurysm. Scattered mediastinal lymph nodes are not pathologically enlarged and are likely reactive, similar to prior study. Visualization of lung fields is limited due to respiratory motion artifact. Atelectasis or consolidation on the right lung base. Intramuscular lipoma versus elastofibroma in the right posterior chest wall. No destructive bone lesions.  IMPRESSION: Interval placement of left chest tube with evacuation of most of the left pleural fluid seen previously. Residual  consolidation or atelectasis in both lung bases, greater on the left. Cardiac enlargement.   Electronically Signed   By: Lucienne Capers M.D.   On: 06/23/2013 00:57   Ct Chest W Contrast  06/22/2013   CLINICAL DATA:  Pleural effusions.  Fevers.  EXAM: CT CHEST WITH CONTRAST  TECHNIQUE: Multidetector CT imaging of the chest was performed during intravenous  contrast administration.  CONTRAST:  160mL OMNIPAQUE IOHEXOL 300 MG/ML  SOLN  COMPARISON:  No priors.  FINDINGS: Mediastinum: Heart size is mildly enlarged. No significant pericardial fluid, thickening or pericardial calcification. There is atherosclerosis of the thoracic aorta, the great vessels of the mediastinum and the coronary arteries, including calcified atherosclerotic plaque in the left main, left anterior descending, left circumflex and right coronary arteries. Numerous borderline enlarged mediastinal and bilateral hilar lymph nodes are conspicuous by the number rather their size, but are nonspecific and likely reactive. The largest of these measures up to 9 mm in the high right paratracheal station. Esophagus is unremarkable in appearance.  Lungs/Pleura: There are multiple noncalcified and small calcified pleural plaques bilaterally. Large left pleural effusion is intermediate in attenuation, likely with proteinaceous contents. This is associated with complete atelectasis of the left lower lobe and partial atelectasis of the left upper lobe. This effusion appears potentially partially loculated, particularly in the left apex. Multifocal noncalcified pleural plaques are also noted in the right hemithorax. No definite suspicious appearing pulmonary nodules or masses are identified, although accurate assessment is limited by extensive patient respiratory motion.  Upper Abdomen: Mild thickening of the adrenal glands bilaterally.  Musculoskeletal: There are no aggressive appearing lytic or blastic lesions noted in the visualized portions of the skeleton.  IMPRESSION: 1. Large left pleural effusion which appears mildly complicated, likely partially loculated with proteinaceous contents. Sampling of the pleural fluid may be useful for diagnostic purposes, particularly to exclude the possibility of infection or underlying malignancy. 2. Multiple calcified non calcified pleural plaques bilaterally, suggesting  asbestos related pleural disease. 3. Numerous borderline enlarged mediastinal and hilar lymph nodes are nonspecific and may be reactive. 4. Mild cardiomegaly. 5. Atherosclerosis, including left main and 3 vessel coronary artery disease. 6. Mild bilateral adrenal thickening, unchanged, presumably adrenal hyperplasia.   Electronically Signed   By: Vinnie Langton M.D.   On: 06/22/2013 13:52   Dg Chest Port 1 View  06/27/2013   CLINICAL DATA:  Chest tube removal.  EXAM: PORTABLE CHEST - 1 VIEW  COMPARISON:  DG CHEST 1V PORT dated 06/26/2013; DG CHEST 1V PORT dated 06/23/2013  FINDINGS: Mediastinum hilar structures are normal. Prominent cardiomegaly again noted. Normal pulmonary vascularity. Persistent left pleural effusion and left basilar atelectasis and/or infiltrate. No pneumothorax. No acute bony abnormality.  IMPRESSION: 1. Persistent left-sided pleural effusion and left basilar atelectasis and/or infiltrate. No evidence of pneumothorax following recent chest tube removal . 2. Stable severe cardiomegaly.   Electronically Signed   By: Marcello Moores  Register   On: 06/27/2013 07:25   Dg Chest Port 1 View  06/26/2013   CLINICAL DATA:  Left chest tube removal  EXAM: PORTABLE CHEST - 1 VIEW  COMPARISON:  Portable exam 1408 hr compared to 1003 hr  FINDINGS: Interval removal of left thoracostomy tube.  Persistent left basilar pleural effusion and atelectasis.  No pneumothorax following chest tube removal.  Enlargement of cardiac silhouette.  Right lung clear.  IMPRESSION: Left pleural effusion and basilar atelectasis persist post thoracostomy tube removal.  No pneumothorax.   Electronically Signed   By: Lavonia Dana  M.D.   On: 06/26/2013 17:14   Dg Chest Port 1 View  06/26/2013   CLINICAL DATA:  Weakness, chest tube, pleural effusion  EXAM: PORTABLE CHEST - 1 VIEW  COMPARISON:  DG CHEST 1V PORT dated 06/25/2013; DG CHEST 1V PORT dated 06/23/2013; DG CHEST 1V PORT dated 06/22/2013; CT CHEST W/O CM dated 06/22/2013  FINDINGS:  Grossly unchanged enlarged cardiac silhouette and mediastinal contours. Interval removal of left jugular approach central venous catheter. Stable positioning of left-sided chest tube. No definite pneumothorax. Grossly unchanged small possibly partially loculated left-sided effusion with associated left basilar heterogeneous soft consolidative opacity. Pulmonary vasculature is less distinct than present examination with cephalization of flow. No new focal airspace opacities. Unchanged bones.  IMPRESSION: 1. Findings suggestive of worsening pulmonary edema. 2. Interval removal of left jugular central venous catheter. Stable positioning of left-sided chest tube. No pneumothorax. 3. Grossly unchanged small left-sided effusion associated left basilar opacities, atelectasis versus infiltrate.   Electronically Signed   By: Sandi Mariscal M.D.   On: 06/26/2013 14:07   Dg Chest Port 1 View  06/25/2013   CLINICAL DATA:  Respiratory failure.  Chest tube.  EXAM: PORTABLE CHEST - 1 VIEW  COMPARISON:  06/24/2013  FINDINGS: Left jugular central venous catheter remains in place with tip overlying the central aspect of the innominate vein, unchanged. A left chest tube is unchanged. The cardiac silhouette remains enlarged. No pneumothorax is identified. Moderate left pleural effusion is unchanged. No right pleural effusion is seen. Patchy left perihilar opacity and more dense left basilar lung opacity do not appear significantly changed. Right lung remains clear.  IMPRESSION: 1. Unchanged appearance of support apparatus.  No pneumothorax. 2. Unchanged left pleural effusion and left lower lobe atelectasis.   Electronically Signed   By: Logan Bores   On: 06/25/2013 07:57   Dg Chest Port 1 View  06/24/2013   CLINICAL DATA:  Pneumothorax  EXAM: PORTABLE CHEST - 1 VIEW  COMPARISON:  06/23/2013  FINDINGS: Left chest tube remains in good position. Moderate left effusion and left lower lobe atelectasis unchanged. No pneumothorax.   Endotracheal tube removed.  Left jugular catheter in the SVC.  Right lung remains clear.  IMPRESSION: Endotracheal tube removed.  No significant change in moderate left effusion and left lower lobe atelectasis. No pneumothorax. Left chest tube remains in place.   Electronically Signed   By: Franchot Gallo M.D.   On: 06/24/2013 07:14   Dg Chest Port 1 View  06/23/2013   CLINICAL DATA:  Chest tube placement  EXAM: PORTABLE CHEST - 1 VIEW  COMPARISON:  Four hundred nineteen  FINDINGS: Endotracheal tube has its tip 4 cm above the carina. Nasogastric tube enters the stomach. Left chest tube remains well-positioned. The right hemi thorax is clear. Moderate amount of pleural density persists on the left with volume loss or infiltrate in the left lower lobe. Left internal jugular central line has its tip at the innominate SVC junction. No pleural air.  IMPRESSION: No change since yesterday. Lines and tubes well positioned. Persistent pleural density on the left with left lower lobe atelectasis/ infiltrate.   Electronically Signed   By: Nelson Chimes M.D.   On: 06/23/2013 07:06   Dg Chest Port 1 View  06/22/2013   CLINICAL DATA:  Hypoxia  EXAM: PORTABLE CHEST - 1 VIEW  COMPARISON:  Study obtained earlier in the day  FINDINGS: There is now a chest tube on the left with significantly smaller effusion on the left. Moderate effusion  remains on the left. Endotracheal tube tip is 3.9 cm above the carina. Nasogastric tube tip and side port are below the diaphragm. Left jugular catheter tip is in the left innominate vein. No pneumothorax.  There is consolidation in the left base, stable. Right lung is clear. Heart is enlarged, stable. Pulmonary vascularity is normal.  IMPRESSION: Left effusion considerably smaller following chest tube placement. There is moderate effusion with left base consolidation persisting on the left side. Right lung is clear. Other tubes and catheters as described. No appreciable pneumothorax. Stable  cardiomegaly.   Electronically Signed   By: Lowella Grip M.D.   On: 06/22/2013 18:56   Dg Chest Port 1 View  06/22/2013   CLINICAL DATA:  Central line placement.  EXAM: PORTABLE CHEST - 1 VIEW  COMPARISON:  Chest x-ray 06/21/2013.  FINDINGS: An endotracheal tube is in place with tip 3.1 cm above the carina. There is a left-sided internal jugular central venous catheter with tip terminating in the mid superior vena cava. Large left pleural effusion, potentially partially loculated, increased compared to recent prior examination, with extensive passive atelectasis in the left lung. Patchy interstitial opacities throughout the right lung base, concerning for sequela of aspiration or developing bronchopneumonia. No right pleural effusion. Mild enlargement of the cardiopericardial silhouette.  IMPRESSION: 1. Enlarging left pleural effusion (potentially partially loculated) with extensive passive atelectasis in the left lung. 2. Ill-defined opacities developing in the right lung base, concerning for developing infection or sequela of aspiration.   Electronically Signed   By: Vinnie Langton M.D.   On: 06/22/2013 16:21   Dg Chest Portable 1 View  06/21/2013   CLINICAL DATA:  80 year old male with fever. Initial encounter.  EXAM: PORTABLE CHEST - 1 VIEW  COMPARISON:  03/21/2012 and earlier.  FINDINGS: Seated AP view at 1904 hrs.  More lordotic view. Increased opacity at the left lung base most resembling moderate pleural effusion. Increased dense retrocardiac opacity. Stable cardiomegaly and mediastinal contours. No pneumothorax. No overt pulmonary edema.  IMPRESSION: New or increased moderate left pleural effusion with increased lower lobe collapse or consolidation.   Electronically Signed   By: Lars Pinks M.D.   On: 06/21/2013 19:29    Medications: Scheduled Meds: . antiseptic oral rinse  15 mL Mouth Rinse BID  . aspirin EC  81 mg Oral Daily  . atenolol  12.5 mg Oral Daily  . diltiazem  180 mg Oral  Daily  . insulin aspart  0-9 Units Subcutaneous TID WC  . levETIRAcetam  500 mg Oral BID  . levofloxacin  750 mg Oral Daily  . predniSONE  60 mg Oral Q breakfast  . sodium chloride  3 mL Intravenous Q12H      LOS: 6 days   Ripudeep Krystal Eaton M.D. [facility] 06/27/2013, 1:04 PM Pager: [phone]  If 7PM-7AM, please contact night-coverage [email or web address] Password [email or web address]  **Disclaimer: This note was dictated with voice recognition software. Similar sounding words can inadvertently be transcribed and this note may contain transcription errors which may not have been corrected upon publication of note.**

## 2013-06-28 LAB — CULTURE, BLOOD (ROUTINE X 2): CULTURE: NO GROWTH

## 2013-06-28 LAB — GLUCOSE, CAPILLARY: Glucose-Capillary: 86 mg/dL (ref 70–99)

## 2013-06-28 MED ORDER — PREDNISONE 10 MG PO TABS: ORAL_TABLET | ORAL | Status: DC

## 2013-06-28 MED ORDER — ALBUTEROL SULFATE HFA 108 (90 BASE) MCG/ACT IN AERS: 2.0000 | INHALATION_SPRAY | Freq: Four times a day (QID) | RESPIRATORY_TRACT | Status: DC | PRN

## 2013-06-28 MED ORDER — LEVOFLOXACIN 750 MG PO TABS: 750.0000 mg | ORAL_TABLET | Freq: Every day | ORAL | Status: DC

## 2013-06-28 NOTE — Discharge Summary (Signed)
Physician Discharge Summary  Patient ID: Terry Robertson MRN: [id number] DOB/AGE: 06-16-1928 80 y.o.  Admit date: 06/21/2013 Discharge date: 06/28/2013  Primary Care Physician:  [name], MD  Discharge Diagnoses:    . Acute hypoxic respiratory failure . Hemothorax, left (suspected parapneumonic with hemorrhagic transformation secondary to Coumadin) . Pneumonia  Acute post hemorrhagic anemia . CAD (coronary artery disease) PCI to circumflex in 1997.  100% occluded RCA . DM type 2 (diabetes mellitus, type 2) . Myelodysplastic syndrome . PAF (paroxysmal atrial fibrillation) . Thrombocytopenia, chronic . Seizure disorder . HTN (hypertension), controlled . Diastolic dysfunction: Grade 2 . CVA (cerebral infarction)   Consults:  Pulmonary critical care                    Cardiothoracic surgery    Recommendations for Outpatient Follow-up:  Please obtain CT of the chest in one month per CT surgery recommendations to be sure that everything has cleared up or if further diagnostic procedures are needed  Please note that patient has been stopped on Coumadin indefinitely or until the current issues of hemothorax have completely cleared up . This will be deferred to cardiology and pulmonology service for future recommendations.   Allergies:  No Known Allergies   Discharge Medications:   Medication List    STOP taking these medications       warfarin 5 MG tablet  Commonly known as:  COUMADIN      TAKE these medications       albuterol 108 (90 BASE) MCG/ACT inhaler  Commonly known as:  PROVENTIL HFA;VENTOLIN HFA  Inhale 2 puffs into the lungs every 6 (six) hours as needed for wheezing or shortness of breath.     aspirin EC 81 MG tablet  Take 81 mg by mouth daily.     atenolol 25 MG tablet  Commonly known as:  TENORMIN  Take 12.5 mg by mouth daily.     diltiazem 180 MG 24 hr capsule  Commonly known as:  DILACOR XR  Take 180 mg by mouth daily.     fish oil-omega-3  fatty acids 1000 MG capsule  Take 1 g by mouth daily.     furosemide 20 MG tablet  Commonly known as:  LASIX  Take 1 tablet (20 mg total) by mouth daily with breakfast.     levETIRAcetam 500 MG tablet  Commonly known as:  KEPPRA  Take 1 tablet (500 mg total) by mouth every 12 (twelve) hours.     levofloxacin 750 MG tablet  Commonly known as:  LEVAQUIN  Take 1 tablet (750 mg total) by mouth daily. x2 more days     nitroGLYCERIN 0.4 MG SL tablet  Commonly known as:  NITROSTAT  Place 0.4 mg under the tongue every 5 (five) minutes as needed for chest pain.     predniSONE 10 MG tablet  Commonly known as:  DELTASONE  Prednisone dosing: Take  Prednisone 40mg  (4 tabs) x 3 days, then taper to 30mg  (3 tabs) x 3 days, then 20mg  (2 tabs) x 3days, then 10mg  (1 tab) x 3days, then OFF.     simvastatin 20 MG tablet  Commonly known as:  ZOCOR  Take 1 tablet (20 mg total) by mouth daily.     terazosin 2 MG capsule  Commonly known as:  HYTRIN  Take 2 mg by mouth at bedtime.     VASCEPA 1 G Caps  Generic drug:  Icosapent Ethyl  Take 1 g by mouth daily.  Vitamin D (Ergocalciferol) 50000 UNITS Caps capsule  Commonly known as:  DRISDOL  Take 50,000 Units by mouth 2 (two) times a week. Takes 1 capsule on Wednesday and Saturday every week         Brief H and P: For complete details please refer to admission H and P, but in brief Terry Robertson is a 80 y.o. male  has a past medical history of Diabetes mellitus; Hypertension; Stroke (08/2011, 03/2012); Seizures; OSA on CPAP; Paroxysmal atrial fibrillation; Obesity, morbid; Heart attack (1997); CAD S/P percutaneous coronary angioplasty (1997); Myelodysplastic syndrome; and BPH (benign prostatic hyperplasia). Patient presented with feeling generalized weakness and fever. EMS was called and he was noted to be febrile up to 103 patient was brought to ER and CXR was worrisome for PNA. Patient have had mild cough. Easy bruising and bleeding. He takes  coumadin for hx of PAF and has chronic thrombocytopenia with hx of MDS   Hospital Course:  Patient is a 80 year old man with a PMH of Stroke (08/2011, 03/2012); Seizures; PAF on coumadin; CAD S/P percutaneous coronary angioplasty (1997); chronic thrombocytopenia and Myelodysplastic syndrome, who presented with CAP initially. He was noted to have acute respiratory distress and large left pleural effusion, and was subsequently transferred to ICU for management. Status post VDRF and left chest tube. Respiratory status stable. Care transferred to [facility] on 4/22. Chest tube removed on 06/26/13.    Acute hypoxic respiratory failure resolved. Secondary to large left pleural effusion, parapneumonic/hemothorax bloody due to warfarin. He had acute respiratory distress and large left pleural effusion, he was subsequently transferred to ICU and was intubated with a left chest tube inserted. The patient had about 500 cc old blood and high pressure blood out during chest tube insertion, (per Dr. Janit Pagan note on 4/19). Patient was given FFP x4 vitamin K for INR reversal. He was extubated on 06/23/2013. Cardiothoracic surgery was consulted with concerns over malignancy possibility however per Dr. Cyndia Bent, no indication for VATS, and his CT chest should be repeated after a month to ensure that everything has cleared up or he needs any further diagnostic procedures.  At this time Coumadin has been stopped indefinitely or until further pulmonology/cardiology recommendations.  Community acquired pneumonia with Left Hemothorax(suspected parapneumonic with hemorrhagic transformation secondary to Coumadin ; Patient was placed initially on vancomycin and Zosyn, now on levofloxacin for 2 more days to complete course.    per CT surgery, Dr. Cyndia Bent, repeat CT of the chest in a month or so.Per pulmonary critical care, continue to hold warfarin indefinitely, followup with pulmonary medicine as outpatient   History of PAF/CAD/status  post in 1997 and 100% RCA occlusion:  - Continue home dose Cardizem and atenolol. Troponins negative.   Acute renal failure: Resolved.   History of hematuria: Managed by urologist Dr. Harlow Asa. Outpatient followup with urology   Acute post hemorrhagic anemia:  - Transfuse if hemoglobin less than 8   Myelodysplastic syndrome/chronic thrombocytopenia: Platelet counts improving and stable.  Type II DM: SSI  History of seizure disorder/CVA:  - Continue Keppra and aspirin.  Chronic diastolic CHF: Compensated    Day of Discharge BP 177/78  Pulse 84  Temp(Src) 97.8 F (36.6 C) (Oral)  Resp 20  Ht 5\' 5"  (1.651 m)  Wt 115.304 kg (254 lb 3.2 oz)  BMI 42.30 kg/m2  SpO2 96%  Physical Exam: General: Alert and awake oriented x3 not in any acute distress. CVS: S1-S2 clear no murmur rubs or gallops Chest: clear to auscultation bilaterally,  no wheezing rales or rhonchi Abdomen: soft nontender, nondistended, normal bowel sounds Extremities: no cyanosis, clubbing or edema noted bilaterally Neuro: Cranial nerves II-XII intact, no focal neurological deficits   The results of significant diagnostics from this hospitalization (including imaging, microbiology, ancillary and laboratory) are listed below for reference.    LAB RESULTS: Basic Metabolic Panel:  Recent Labs Lab 06/23/13 0835  06/25/13 0500 06/26/13 0245  NA 139  < > 146 144  K 3.6*  < > 3.9 4.2  CL 104  < > 111 108  CO2 22  < > 25 26  GLUCOSE 177*  < > 141* 143*  BUN 25*  < > 27* 25*  CREATININE 1.64*  < > 0.94 0.92  CALCIUM 8.3*  < > 8.6 8.7  MG 2.2  --  2.5  --   PHOS 2.7  --   --   --   < > = values in this interval not displayed. Liver Function Tests:  Recent Labs Lab 06/22/13 0500 06/22/13 1715  AST 21 25  ALT 13 17  ALKPHOS 51 54  BILITOT 0.6 0.6  PROT 9.0* 8.7*  ALBUMIN 3.0* 3.1*   No results found for this basename: LIPASE, AMYLASE,  in the last 168 hours No results found for this basename:  AMMONIA,  in the last 168 hours CBC:  Recent Labs Lab 06/22/13 1715  06/26/13 0245 06/27/13 0534  WBC 9.8  < > 6.2 4.9  NEUTROABS 8.2*  --   --   --   HGB 7.9*  < > 8.0* 8.2*  HCT 26.0*  < > 25.3* 26.2*  MCV 85.8  < > 85.8 85.3  PLT 71*  < > 139* 130*  < > = values in this interval not displayed. Cardiac Enzymes:  Recent Labs Lab 06/22/13 2143 06/23/13 0835  TROPONINI <0.30 <0.30   BNP: No components found with this basename: POCBNP,  CBG:  Recent Labs Lab 06/27/13 2158 06/28/13 0803  GLUCAP 139* 86    Significant Diagnostic Studies:  Ct Chest Wo Contrast  06/23/2013   CLINICAL DATA:  Empyema with chest tube.  EXAM: CT CHEST WITHOUT CONTRAST  TECHNIQUE: Multidetector CT imaging of the chest was performed following the standard protocol without IV contrast.  COMPARISON:  DG CHEST 1V PORT dated 06/22/2013; CT CHEST W/CM dated 06/22/2013  FINDINGS: Since the prior study, there is been interval placement of a left chest tube with significant evacuation of previous left pleural effusion. There is residual consolidation and atelectasis in the left lung. Mild subcutaneous emphysema in the left chest. Endotracheal and enteric tubes are in place. Left central venous catheter with tip in the brachiocephalic vein. Cardiac enlargement with Coronary artery calcifications. Calcification of thoracic aorta without aneurysm. Scattered mediastinal lymph nodes are not pathologically enlarged and are likely reactive, similar to prior study. Visualization of lung fields is limited due to respiratory motion artifact. Atelectasis or consolidation on the right lung base. Intramuscular lipoma versus elastofibroma in the right posterior chest wall. No destructive bone lesions.  IMPRESSION: Interval placement of left chest tube with evacuation of most of the left pleural fluid seen previously. Residual consolidation or atelectasis in both lung bases, greater on the left. Cardiac enlargement.   Electronically  Signed   By: Lucienne Capers M.D.   On: 06/23/2013 00:57   Ct Chest W Contrast  06/22/2013   CLINICAL DATA:  Pleural effusions.  Fevers.  EXAM: CT CHEST WITH CONTRAST  TECHNIQUE: Multidetector CT imaging of the  chest was performed during intravenous contrast administration.  CONTRAST:  157mL OMNIPAQUE IOHEXOL 300 MG/ML  SOLN  COMPARISON:  No priors.  FINDINGS: Mediastinum: Heart size is mildly enlarged. No significant pericardial fluid, thickening or pericardial calcification. There is atherosclerosis of the thoracic aorta, the great vessels of the mediastinum and the coronary arteries, including calcified atherosclerotic plaque in the left main, left anterior descending, left circumflex and right coronary arteries. Numerous borderline enlarged mediastinal and bilateral hilar lymph nodes are conspicuous by the number rather their size, but are nonspecific and likely reactive. The largest of these measures up to 9 mm in the high right paratracheal station. Esophagus is unremarkable in appearance.  Lungs/Pleura: There are multiple noncalcified and small calcified pleural plaques bilaterally. Large left pleural effusion is intermediate in attenuation, likely with proteinaceous contents. This is associated with complete atelectasis of the left lower lobe and partial atelectasis of the left upper lobe. This effusion appears potentially partially loculated, particularly in the left apex. Multifocal noncalcified pleural plaques are also noted in the right hemithorax. No definite suspicious appearing pulmonary nodules or masses are identified, although accurate assessment is limited by extensive patient respiratory motion.  Upper Abdomen: Mild thickening of the adrenal glands bilaterally.  Musculoskeletal: There are no aggressive appearing lytic or blastic lesions noted in the visualized portions of the skeleton.  IMPRESSION: 1. Large left pleural effusion which appears mildly complicated, likely partially loculated  with proteinaceous contents. Sampling of the pleural fluid may be useful for diagnostic purposes, particularly to exclude the possibility of infection or underlying malignancy. 2. Multiple calcified non calcified pleural plaques bilaterally, suggesting asbestos related pleural disease. 3. Numerous borderline enlarged mediastinal and hilar lymph nodes are nonspecific and may be reactive. 4. Mild cardiomegaly. 5. Atherosclerosis, including left main and 3 vessel coronary artery disease. 6. Mild bilateral adrenal thickening, unchanged, presumably adrenal hyperplasia.   Electronically Signed   By: Vinnie Langton M.D.   On: 06/22/2013 13:52   Dg Chest Port 1 View  06/22/2013   CLINICAL DATA:  Hypoxia  EXAM: PORTABLE CHEST - 1 VIEW  COMPARISON:  Study obtained earlier in the day  FINDINGS: There is now a chest tube on the left with significantly smaller effusion on the left. Moderate effusion remains on the left. Endotracheal tube tip is 3.9 cm above the carina. Nasogastric tube tip and side port are below the diaphragm. Left jugular catheter tip is in the left innominate vein. No pneumothorax.  There is consolidation in the left base, stable. Right lung is clear. Heart is enlarged, stable. Pulmonary vascularity is normal.  IMPRESSION: Left effusion considerably smaller following chest tube placement. There is moderate effusion with left base consolidation persisting on the left side. Right lung is clear. Other tubes and catheters as described. No appreciable pneumothorax. Stable cardiomegaly.   Electronically Signed   By: Lowella Grip M.D.   On: 06/22/2013 18:56   Dg Chest Port 1 View  06/22/2013   CLINICAL DATA:  Central line placement.  EXAM: PORTABLE CHEST - 1 VIEW  COMPARISON:  Chest x-ray 06/21/2013.  FINDINGS: An endotracheal tube is in place with tip 3.1 cm above the carina. There is a left-sided internal jugular central venous catheter with tip terminating in the mid superior vena cava. Large left  pleural effusion, potentially partially loculated, increased compared to recent prior examination, with extensive passive atelectasis in the left lung. Patchy interstitial opacities throughout the right lung base, concerning for sequela of aspiration or developing bronchopneumonia. No right pleural  effusion. Mild enlargement of the cardiopericardial silhouette.  IMPRESSION: 1. Enlarging left pleural effusion (potentially partially loculated) with extensive passive atelectasis in the left lung. 2. Ill-defined opacities developing in the right lung base, concerning for developing infection or sequela of aspiration.   Electronically Signed   By: Vinnie Langton M.D.   On: 06/22/2013 16:21   Dg Chest Portable 1 View  06/21/2013   CLINICAL DATA:  80 year old male with fever. Initial encounter.  EXAM: PORTABLE CHEST - 1 VIEW  COMPARISON:  03/21/2012 and earlier.  FINDINGS: Seated AP view at 1904 hrs.  More lordotic view. Increased opacity at the left lung base most resembling moderate pleural effusion. Increased dense retrocardiac opacity. Stable cardiomegaly and mediastinal contours. No pneumothorax. No overt pulmonary edema.  IMPRESSION: New or increased moderate left pleural effusion with increased lower lobe collapse or consolidation.   Electronically Signed   By: Lars Pinks M.D.   On: 06/21/2013 19:29    Disposition and Follow-up:     Discharge Orders   Future Appointments Provider Department Dept Phone   07/02/2013 12:10 PM Tommy Medal, [place] [phone]   08/06/2013 1:00 PM [facility] [phone]   08/06/2013 1:30 PM [facility] Provider [facility] [phone]   08/11/2013 11:15 AM Kendell Bane, [place] [facility] [place]   08/20/2013 2:30 PM Dennie Bible, NP [facility] [phone]   Future Orders Complete By Expires   Diet Carb  Modified  As directed    Discharge instructions  As directed    Increase activity slowly  As directed        DISPOSITION: Home  TESTS THAT NEED FOLLOW-UP CT of the chest in a month   DISCHARGE FOLLOW-UP Follow-up Information   Follow up with [name]., MD. Schedule an appointment as soon as possible for a visit in 2 weeks. (for hospital follow-up, CT chest will arranged out-patient )    Specialty:  Pulmonary Disease   Contact information:   [address] [place] [address] [phone]       Follow up with [name], MD. Schedule an appointment as soon as possible for a visit in 2 weeks. (for hospital follow-up)    Specialty:  Internal Medicine   Contact information:   [place] [address] [phone]       Follow up with HILTY,Kenneth C, MD. Schedule an appointment as soon as possible for a visit in 4 weeks. (for hospital follow-up)    Specialty:  Cardiology   Contact information:   [place] [address] [phone]       Time spent on Discharge: 45 minutes   Signed:   Mendel Corning M.D. [facility] 06/28/2013, 12:32 PM Pager: [phone]   **Disclaimer: This note was dictated with voice recognition software. Similar sounding words can inadvertently be transcribed and this note may contain transcription errors which may not have been corrected upon publication of note.**

## 2013-07-02 ENCOUNTER — Ambulatory Visit: Payer: Medicare HMO | Admitting: Pharmacist Clinician (PhC)/ Clinical Pharmacy Specialist

## 2013-07-07 ENCOUNTER — Ambulatory Visit (INDEPENDENT_AMBULATORY_CARE_PROVIDER_SITE_OTHER)
Admission: RE | Admit: 2013-07-07 | Discharge: 2013-07-07 | Disposition: A | Discharge status: Home / Self Care | Payer: Medicare HMO | Source: Ambulatory Visit | Attending: Adult Health | Admitting: Adult Health

## 2013-07-07 ENCOUNTER — Encounter: Payer: Self-pay | Admitting: Adult Health

## 2013-07-07 ENCOUNTER — Ambulatory Visit (INDEPENDENT_AMBULATORY_CARE_PROVIDER_SITE_OTHER): Payer: Medicare HMO | Admitting: Adult Health

## 2013-07-07 VITALS — BP 148/84 | HR 75 | Temp 98.3°F | Ht 65.0 in | Wt 243.4 lb

## 2013-07-07 DIAGNOSIS — J189 Pneumonia, unspecified organism: Secondary | ICD-10-CM

## 2013-07-07 DIAGNOSIS — J942 Hemothorax: Secondary | ICD-10-CM

## 2013-07-07 DIAGNOSIS — J90 Pleural effusion, not elsewhere classified: Secondary | ICD-10-CM

## 2013-07-07 NOTE — Assessment & Plan Note (Addendum)
Left hemothorax in setting of chronic coumadin and CAP w/ parapneumonic effusion  Need to cont to watch previous chest tube site for healing Wound care provided and care instructions to family/pt Improved with coumadin reversal and chest tube Cont to monitor  Repeat CT chest in 4-6 weeks

## 2013-07-07 NOTE — Patient Instructions (Signed)
Clear chest wall daily with soap and water, pat dry.apply neosporin and cover with telfa non stick bandage.  Watch for redness, drainage or fever  Continue on current regimen  Follow up Dr. [place] Bells in 2-3 weeks and As needed   Please contact office for sooner follow up if symptoms do not improve or worsen or seek emergency care  Follow up with Cardiology this week as planned  Follow up with Family doctor this week as planned

## 2013-07-07 NOTE — Assessment & Plan Note (Signed)
Clinically improving  cxr w/ no sign change Cont to follow for clearance  follow up CT chest in 4-6 weeks

## 2013-07-07 NOTE — Progress Notes (Signed)
Subjective:    Patient ID: Terry Robertson, male    DOB: 1928/07/02, 80 y.o.   MRN: [id number]  HPI 80 yo seen for initial pulmonary consult during hospitalization 06/21/2013 for hemothorax, parapneumonic effusion on chronic Coumadin for atrial fib .   07/07/2013 [facility] follow up   He was admitted on 06/21/2013 with CAP with respiratory distress and a large left pleural effusion. He was transferred to the ICU and was intubated on 4/19 for increased respiratory distress. His INR was 2.4 on admission and this was reversed with FFP and vitamin K. He had a left chest tube placed  A follow-up chest CT on 4/20 showed near complete drainage of the left pleural effusion with significant persistent consolidation of the left lower lobe. . Cytology on the fluid was negative for malignancy. Gram stain and culture were negative. He improved and was extubated.  Chest tube removed on 06/26/13 .  Pt says he removed dressing when he got home from the CT site. CTS consulted , no VATS w/ recs for repeat CT chest in 1 months. Coumadin to be held indefinitely.  He was tx w/ aggressive abx and discharged on levaquin to complete his course.  He denies any hemoptysis, orthopnea, PND, or increased leg swelling  Has follow up with PCP and cards this week.    Review of Systems Constitutional:   No  weight loss, night sweats,  Fevers, chills, + fatigue, or  lassitude.  HEENT:   No headaches,  Difficulty swallowing,  Tooth/dental problems, or  Sore throat,                No sneezing, itching, ear ache, nasal congestion, post nasal drip,   CV:  No chest pain,  Orthopnea, PND,   anasarca, dizziness, palpitations, syncope.   GI  No heartburn, indigestion, abdominal pain, nausea, vomiting, diarrhea, change in bowel habits, loss of appetite, bloody stools.   Resp:    No chest wall deformity  Skin: no rash or lesions.  GU: no dysuria, change in color of urine, no urgency or frequency.  No flank pain, no hematuria    MS:  No joint pain or swelling.  No decreased range of motion.  No back pain.  Psych:  No change in mood or affect. No depression or anxiety.  No memory loss.          Objective:   Physical Exam GEN: A/Ox3; pleasant , NAD, elderly   HEENT:  [place]/AT,  EACs-clear, TMs-wnl, NOSE-clear, THROAT-clear, no lesions, no postnasal drip or exudate noted.   NECK:  Supple w/ fair ROM; no JVD; normal carotid impulses w/o bruits; no thyromegaly or nodules palpated; no lymphadenopathy.  RESP  Clear  P & A; w/o, wheezes/ rales/ or rhonchi.no accessory muscle use, no dullness to percussion Left lateral chest wall w/ partially healed open wound from previous chest tube, suture partially intact ( removed without difficulty) no redness or drainage   CARD:  RRR, no m/r/g  , tr-1 peripheral edema, pulses intact, no cyanosis or clubbing.  GI:   Soft & nt; nml bowel sounds; no organomegaly or masses detected.  Musco: Warm bil, no deformities or joint swelling noted.   Neuro: alert, no focal deficits noted.    Skin: Warm, no lesions or rashes    CXR 07/07/2013 Persistent left effusion and left lung atelectasis.   Wound care w/ dressing to old chest tube site on left chest wall.      Assessment & Plan:

## 2013-07-09 NOTE — Progress Notes (Signed)
I agree with above 

## 2013-07-11 ENCOUNTER — Ambulatory Visit (INDEPENDENT_AMBULATORY_CARE_PROVIDER_SITE_OTHER): Payer: Medicare HMO | Admitting: Cardiology

## 2013-07-11 VITALS — BP 150/86 | HR 73 | Ht 65.0 in | Wt 240.0 lb

## 2013-07-11 DIAGNOSIS — Z7901 Long term (current) use of anticoagulants: Secondary | ICD-10-CM

## 2013-07-11 DIAGNOSIS — I2510 Atherosclerotic heart disease of native coronary artery without angina pectoris: Secondary | ICD-10-CM

## 2013-07-11 DIAGNOSIS — J90 Pleural effusion, not elsewhere classified: Secondary | ICD-10-CM

## 2013-07-11 DIAGNOSIS — I4891 Unspecified atrial fibrillation: Secondary | ICD-10-CM

## 2013-07-11 DIAGNOSIS — J942 Hemothorax: Secondary | ICD-10-CM

## 2013-07-11 DIAGNOSIS — I10 Essential (primary) hypertension: Secondary | ICD-10-CM

## 2013-07-11 DIAGNOSIS — I480 Paroxysmal atrial fibrillation: Secondary | ICD-10-CM

## 2013-07-11 NOTE — Patient Instructions (Signed)
Your physician recommends that you schedule a follow-up appointment soon after you have your repeat CT at the [address] office.

## 2013-07-11 NOTE — Progress Notes (Signed)
Patient ID: Terry Robertson, male   DOB: 02-25-1929, 80 y.o.   MRN: [id number]    07/11/2013 Damauri Minion   1928-07-02  [id number]  Primary Physicia [name], MD Primary Cardiologist: Dr. Ellyn Hack  HPI:  The patient is a 80 y/o male, followed by Dr. Ellyn Hack, with a history significant for CAD, s/p MI in 1997 resulting in PCI + stenting of the LAD and circumflex. Repeat LHC in 2008 demonstrated widely patent stents in the circumflex and LAD. The RCA had a mid lesion and then was totally occluded distally. His most recent 2D echo was June 2013, demonstrating normal systolic function, with an EF of 55-65% w/o WMA and also with Grade II diastolic dysfunction. He also has a history of PAF, as well as h/o stroke, HTN, HLD and Myelodysplastic syndrome. He has been on chronic Warfarin for anticoagulation (CHA2DS2-VASc score of 7 ).  He presents to clinic today for post-hospital f/u. He was admitted to [facility] from 06/21/13 - 06/28/13. He presented with a complaint of generalized weakness and fever. Temp in ER was 103. CXR was worrisome for PNA and also showed a large left pleural effusion. After insertion of a chest tube for drainage, this was discovered to be a parpneumonic/hemothorax.  His INR on arrival was 2.4. His warfarin was discontinued and he was given FFP x 4 and vitamin K for INR reversal.  Cardiothoracic surgery was consulted with concerns over malignancy possibility, however fluid cytology was negative for malignancy. There was no indication for VATS. Dr. Cyndia Bent recommended that Coumadin be held until completely resolved, as he felt that the risk of bleeding is greater that the risk of stroke at this time. He recommended repeating a CT scan in 1 month to ensure that everything clears up before restarting Coumadin.Dr. Cyndia Bent felt that he would have continued improvement with pulmonary toilet and antibiotics.   He present to clinic today for follow-up. He states that he has been doing well but is concerned  about being off of Coumadin, as he has suffered two strokes in the past. Unfortunately, it has not yet been 1 month since discharge and it will be another 3 weeks until a repeat 2D echo will be performed. He denies any significant symptoms since being discharged from the hospital. No sign/symptoms of stroke or TIA. His EKG demonstrates NSR.    Current Outpatient Prescriptions  Medication Sig Dispense Refill  . albuterol (PROVENTIL HFA;VENTOLIN HFA) 108 (90 BASE) MCG/ACT inhaler Inhale 2 puffs into the lungs every 6 (six) hours as needed for wheezing or shortness of breath.  1 Inhaler  2  . aspirin EC 81 MG tablet Take 81 mg by mouth daily.      Marland Kitchen atenolol (TENORMIN) 25 MG tablet Take 12.5 mg by mouth daily.      Marland Kitchen diltiazem (DILACOR XR) 180 MG 24 hr capsule Take 180 mg by mouth daily.      . fish oil-omega-3 fatty acids 1000 MG capsule Take 1 g by mouth daily.      . furosemide (LASIX) 20 MG tablet Take 1 tablet (20 mg total) by mouth daily with breakfast.  30 tablet  1  . Icosapent Ethyl (VASCEPA) 1 G CAPS Take 1 g by mouth daily.      Marland Kitchen levETIRAcetam (KEPPRA) 500 MG tablet Take 1 tablet (500 mg total) by mouth every 12 (twelve) hours.  60 tablet  6  . simvastatin (ZOCOR) 40 MG tablet Take 40 mg by mouth daily.      Marland Kitchen  Vitamin D, Ergocalciferol, (DRISDOL) 50000 UNITS CAPS Take 50,000 Units by mouth 2 (two) times a week. Takes 1 capsule on Wednesday and Saturday every week       No current facility-administered medications for this visit.    No Known Allergies  History   Social History  . Marital Status: Widowed    Spouse Name: N/A    Number of Children: 40  . Years of Education: 11   Occupational History  . Not on file.   Social History Main Topics  . Smoking status: Former Smoker    Types: Cigarettes    Quit date: 03/08/1988  . Smokeless tobacco: Never Used  . Alcohol Use: No  . Drug Use: No  . Sexual Activity: Not on file   Other Topics Concern  . Not on file   Social  History Narrative   Pt is widowed father of 65, 84 grandchildren, 81 great-grandchildren and 3 great-great-grandchildren.  He does not get routine exercise.   Patient has a high school education.   Patient is right-handed.   Patient drinks one cup of coffee daily.     Review of Systems: General: negative for chills, fever, night sweats or weight changes.  Cardiovascular: negative for chest pain, dyspnea on exertion, edema, orthopnea, palpitations, paroxysmal nocturnal dyspnea or shortness of breath Dermatological: negative for rash Respiratory: negative for cough or wheezing Urologic: negative for hematuria Abdominal: negative for nausea, vomiting, diarrhea, bright red blood per rectum, melena, or hematemesis Neurologic: negative for visual changes, syncope, or dizziness All other systems reviewed and are otherwise negative except as noted above.    Blood pressure 150/86, pulse 73, height 5\' 5"  (1.651 m), weight 240 lb (108.863 kg).  General appearance: alert, cooperative and no distress Lungs: clear to auscultation bilaterally Heart: regular rate and rhythm and 2/6 SM Extremities: no LEE Pulses: 2+ and symmetric Skin: warm and dry Neurologic: Grossly normal  EKG NSR 73 bpm  ASSESSMENT AND PLAN:   Hemothorax, left Warfarin on hold. Repeat CT scan in 3 weeks to re-evaluate.  PAF (paroxysmal atrial fibrillation) EKG today demonstrates NSR. Continue atenolol and diltiazem for rate control. His CHA2DS2 VASc score is 7. If repeat CT scan is clear, then he will likely need to resume Coumadin for stroke prophylaxis.   Long term (current) use of anticoagulants Coumadin currently on hold. Resume if repeat CT scan is stable.    HTN (hypertension), controlled Controlled.  CAD (coronary artery disease) PCI to circumflex in 1997.  100% occluded RCA Stable. Denies chest pain.     PLAN  Awaiting repeat CT scan to re-evaluate hemothorax. If stable and the risk for re-bleed is low, he  will benefit from re-starting oral anticoagulation, in the setting of PAF w/ CHA2DS2  VASc score of 7. He has been instructed to f/u with Dr. Ellyn Hack or APP in 3 weeks after CT is complete. He has been instructed to seek urgent medical evaluation if he develops s/s of stroke or TIA.   Shawanna Zanders SimmonsPA-C 07/11/2013 9:17 PM

## 2013-07-14 ENCOUNTER — Telehealth: Payer: Self-pay | Admitting: Internal Medicine

## 2013-07-14 ENCOUNTER — Telehealth: Payer: Self-pay | Admitting: Cardiology

## 2013-07-14 NOTE — Telephone Encounter (Signed)
Per answering service:-Does pt need to have Emerson did not schedule him one, Please call him back,

## 2013-07-14 NOTE — Telephone Encounter (Signed)
Saw Terry Robertson last week.. They thought Terry Robertson  was going to have a CT Scan at [facility] and now [facility] are not going to do the CT Scan.  Terry Robertson does not know what to do.

## 2013-07-15 ENCOUNTER — Emergency Department (HOSPITAL_COMMUNITY): Payer: Medicare HMO

## 2013-07-15 ENCOUNTER — Emergency Department (HOSPITAL_COMMUNITY)
Admission: EM | Admit: 2013-07-15 | Discharge: 2013-07-15 | Disposition: A | Discharge status: Home / Self Care | Payer: Medicare HMO | Attending: Emergency Medicine | Admitting: Emergency Medicine

## 2013-07-15 ENCOUNTER — Encounter (HOSPITAL_COMMUNITY): Payer: Self-pay | Admitting: Emergency Medicine

## 2013-07-15 DIAGNOSIS — I10 Essential (primary) hypertension: Secondary | ICD-10-CM | POA: Insufficient documentation

## 2013-07-15 DIAGNOSIS — I2510 Atherosclerotic heart disease of native coronary artery without angina pectoris: Secondary | ICD-10-CM | POA: Insufficient documentation

## 2013-07-15 DIAGNOSIS — Z79899 Other long term (current) drug therapy: Secondary | ICD-10-CM | POA: Insufficient documentation

## 2013-07-15 DIAGNOSIS — Z87891 Personal history of nicotine dependence: Secondary | ICD-10-CM | POA: Insufficient documentation

## 2013-07-15 DIAGNOSIS — G4733 Obstructive sleep apnea (adult) (pediatric): Secondary | ICD-10-CM | POA: Insufficient documentation

## 2013-07-15 DIAGNOSIS — G40909 Epilepsy, unspecified, not intractable, without status epilepticus: Secondary | ICD-10-CM | POA: Insufficient documentation

## 2013-07-15 DIAGNOSIS — Z8709 Personal history of other diseases of the respiratory system: Secondary | ICD-10-CM | POA: Insufficient documentation

## 2013-07-15 DIAGNOSIS — R5383 Other fatigue: Secondary | ICD-10-CM

## 2013-07-15 DIAGNOSIS — E119 Type 2 diabetes mellitus without complications: Secondary | ICD-10-CM | POA: Insufficient documentation

## 2013-07-15 DIAGNOSIS — I509 Heart failure, unspecified: Secondary | ICD-10-CM | POA: Insufficient documentation

## 2013-07-15 DIAGNOSIS — Z9889 Other specified postprocedural states: Secondary | ICD-10-CM | POA: Insufficient documentation

## 2013-07-15 DIAGNOSIS — R630 Anorexia: Secondary | ICD-10-CM | POA: Insufficient documentation

## 2013-07-15 DIAGNOSIS — I4891 Unspecified atrial fibrillation: Secondary | ICD-10-CM | POA: Insufficient documentation

## 2013-07-15 DIAGNOSIS — R5381 Other malaise: Secondary | ICD-10-CM | POA: Insufficient documentation

## 2013-07-15 DIAGNOSIS — Z8673 Personal history of transient ischemic attack (TIA), and cerebral infarction without residual deficits: Secondary | ICD-10-CM | POA: Insufficient documentation

## 2013-07-15 DIAGNOSIS — Z9861 Coronary angioplasty status: Secondary | ICD-10-CM | POA: Insufficient documentation

## 2013-07-15 DIAGNOSIS — Z7982 Long term (current) use of aspirin: Secondary | ICD-10-CM | POA: Insufficient documentation

## 2013-07-15 DIAGNOSIS — Z87448 Personal history of other diseases of urinary system: Secondary | ICD-10-CM | POA: Insufficient documentation

## 2013-07-15 DIAGNOSIS — R61 Generalized hyperhidrosis: Secondary | ICD-10-CM | POA: Insufficient documentation

## 2013-07-15 DIAGNOSIS — Z9981 Dependence on supplemental oxygen: Secondary | ICD-10-CM | POA: Insufficient documentation

## 2013-07-15 DIAGNOSIS — R110 Nausea: Secondary | ICD-10-CM | POA: Insufficient documentation

## 2013-07-15 LAB — CBC WITH DIFFERENTIAL/PLATELET
BASOS ABS: 0 10*3/uL (ref 0.0–0.1)
BASOS PCT: 0 % (ref 0–1)
Band Neutrophils: 0 % (ref 0–10)
Blasts: 0 %
Eosinophils Absolute: 0 10*3/uL (ref 0.0–0.7)
Eosinophils Relative: 0 % (ref 0–5)
HCT: 32.1 % — ABNORMAL LOW (ref 39.0–52.0)
HEMOGLOBIN: 9.7 g/dL — AB (ref 13.0–17.0)
Lymphocytes Relative: 17 % (ref 12–46)
Lymphs Abs: 1.1 10*3/uL (ref 0.7–4.0)
MCH: 26.6 pg (ref 26.0–34.0)
MCHC: 30.2 g/dL (ref 30.0–36.0)
MCV: 87.9 fL (ref 78.0–100.0)
METAMYELOCYTES PCT: 0 %
MYELOCYTES: 0 %
Monocytes Absolute: 1.4 10*3/uL — ABNORMAL HIGH (ref 0.1–1.0)
Monocytes Relative: 22 % — ABNORMAL HIGH (ref 3–12)
Neutro Abs: 3.9 10*3/uL (ref 1.7–7.7)
Neutrophils Relative %: 61 % (ref 43–77)
Platelets: 133 10*3/uL — ABNORMAL LOW (ref 150–400)
Promyelocytes Absolute: 0 %
RBC: 3.65 MIL/uL — ABNORMAL LOW (ref 4.22–5.81)
RDW: 18.2 % — ABNORMAL HIGH (ref 11.5–15.5)
WBC: 6.4 10*3/uL (ref 4.0–10.5)
nRBC: 0 /100 WBC

## 2013-07-15 LAB — URINALYSIS, ROUTINE W REFLEX MICROSCOPIC
Bilirubin Urine: NEGATIVE
Glucose, UA: NEGATIVE mg/dL
Hgb urine dipstick: NEGATIVE
Ketones, ur: NEGATIVE mg/dL
LEUKOCYTES UA: NEGATIVE
NITRITE: NEGATIVE
PH: 5.5 (ref 5.0–8.0)
Protein, ur: 100 mg/dL — AB
SPECIFIC GRAVITY, URINE: 1.023 (ref 1.005–1.030)
UROBILINOGEN UA: 1 mg/dL (ref 0.0–1.0)

## 2013-07-15 LAB — COMPREHENSIVE METABOLIC PANEL
ALBUMIN: 2.6 g/dL — AB (ref 3.5–5.2)
ALT: 18 U/L (ref 0–53)
AST: 19 U/L (ref 0–37)
Alkaline Phosphatase: 56 U/L (ref 39–117)
BUN: 10 mg/dL (ref 6–23)
CO2: 24 mEq/L (ref 19–32)
Calcium: 8.6 mg/dL (ref 8.4–10.5)
Chloride: 104 mEq/L (ref 96–112)
Creatinine, Ser: 0.88 mg/dL (ref 0.50–1.35)
GFR calc Af Amer: >90 mL/min (ref >90)
GFR calc non Af Amer: 78 mL/min — ABNORMAL LOW (ref >90)
Glucose, Bld: 108 mg/dL — ABNORMAL HIGH (ref 70–99)
POTASSIUM: 3.8 meq/L (ref 3.7–5.3)
Sodium: 141 mEq/L (ref 137–147)
TOTAL PROTEIN: 8.1 g/dL (ref 6.0–8.3)
Total Bilirubin: 0.4 mg/dL (ref 0.3–1.2)

## 2013-07-15 LAB — URINE MICROSCOPIC-ADD ON

## 2013-07-15 LAB — TROPONIN I: Troponin I: <0.3 ng/mL (ref <0.30)

## 2013-07-15 LAB — I-STAT TROPONIN, ED: TROPONIN I, POC: 0.02 ng/mL (ref 0.00–0.08)

## 2013-07-15 NOTE — Discharge Instructions (Signed)

## 2013-07-15 NOTE — Telephone Encounter (Signed)
LMTCB

## 2013-07-15 NOTE — ED Provider Notes (Signed)
CSN: [id number]     Arrival date & time 07/15/13  1047 History   First MD Initiated Contact with Patient 07/15/13 1127     Chief Complaint  Patient presents with  . Altered Mental Status     (Consider location/radiation/quality/duration/timing/severity/associated sxs/prior Treatment) Patient is a 80 y.o. male presenting with altered mental status. The history is provided by the patient. No language interpreter was used.  Altered Mental Status Presenting symptoms: no confusion   Presenting symptoms comment:  Malaise  Severity:  Moderate Most recent episode:  Yesterday Timing:  Constant Progression:  Unchanged Chronicity:  New Context: recent illness   Associated symptoms: nausea   Associated symptoms: no abdominal pain, normal movement, no agitation, no depression, no fever, no headaches, no light-headedness, no rash, no visual change, no vomiting and no weakness   Associated symptoms comment:  Diaphoresis, anorexia    Past Medical History  Diagnosis Date  . Diabetes mellitus   . Hypertension   . Stroke 08/2011, 03/2012    L MCA (Afib RVR while admitted for hypotension) - expressive aphasia (almost totally recovered)  . Seizures     Post CVA; on Keppra  . OSA on CPAP   . Paroxysmal atrial fibrillation     on coumadin; Diagnosed at the time of his 1st CVA  . Obesity, morbid     BMI  . Heart attack 1997    inferior MI with left circumflex stent  . CAD S/P percutaneous coronary angioplasty 1997    status post remote PCI to the LAD and circumflex in 1997, heart cath in 2008 showed widely patent stents in the circumflex and LAD.  RCA had a mid lesion and then was totally occluded distally.  . Myelodysplastic syndrome     w/mild anemia an neutropenia  . BPH (benign prostatic hyperplasia)     TURP in 12/2010  . CHF (congestive heart failure)   . Asbestosis     family unclear where he was evaluated in the past   Past Surgical History  Procedure Laterality Date  .  Transthoracic echocardiogram  08/06/11    EF 55% to 65% with grade2 diastolic dysfunction/pseudonormal with mildly dilated left atrium, had evidence of elevated filling ressures in the left vientricle and left atrium  . Cardiac catheterization  02/08/07    widely patent stent in the circumflex and LAD.  RCA had a mid lesion and then was totally occluded distally. Cook bare-metal 3.5x20 mm stent  . Transurethral resection of prostate  12/2010  . Nm myoview ltd  01/09/1999    Inferior wall thinning with possible mild ischemia versus infarct. This would correlate well with his known RCA occlusion   Family History  Problem Relation Age of Onset  . Heart disease Mother   . Heart attack Father 67   History  Substance Use Topics  . Smoking status: Former Smoker    Types: Cigarettes    Quit date: 03/08/1988  . Smokeless tobacco: Never Used  . Alcohol Use: No    Review of Systems  Constitutional: Positive for activity change, appetite change and fatigue. Negative for fever.  HENT: Negative for congestion, facial swelling, rhinorrhea and trouble swallowing.   Eyes: Negative for photophobia and pain.  Respiratory: Negative for cough, chest tightness and shortness of breath.   Cardiovascular: Negative for chest pain and leg swelling.  Gastrointestinal: Positive for nausea. Negative for vomiting, abdominal pain, diarrhea and constipation.  Endocrine: Negative for polydipsia and polyuria.  Genitourinary: Negative for dysuria, urgency, decreased  urine volume and difficulty urinating.  Musculoskeletal: Negative for back pain and gait problem.  Skin: Negative for color change, rash and wound.  Allergic/Immunologic: Negative for immunocompromised state.  Neurological: Negative for dizziness, facial asymmetry, speech difficulty, weakness, light-headedness, numbness and headaches.  Psychiatric/Behavioral: Negative for confusion, decreased concentration and agitation.      Allergies  Review of  patient's allergies indicates no known allergies.  Home Medications   Prior to Admission medications   Medication Sig Start Date End Date Taking? Authorizing Provider  albuterol (PROVENTIL HFA;VENTOLIN HFA) 108 (90 BASE) MCG/ACT inhaler Inhale 2 puffs into the lungs every 6 (six) hours as needed for wheezing or shortness of breath. 06/28/13  Yes Ripudeep K Rai, MD  aspirin EC 81 MG tablet Take 81 mg by mouth daily.   Yes Historical Provider, MD  atenolol (TENORMIN) 25 MG tablet Take 12.5 mg by mouth daily.   Yes Historical Provider, MD  diltiazem (DILACOR XR) 180 MG 24 hr capsule Take 180 mg by mouth daily.   Yes Historical Provider, MD  fish oil-omega-3 fatty acids 1000 MG capsule Take 1 g by mouth daily.   Yes Historical Provider, MD  furosemide (LASIX) 20 MG tablet Take 1 tablet (20 mg total) by mouth daily with breakfast. 03/29/12  Yes Ivan Anchors Love, PA-C  Icosapent Ethyl (VASCEPA) 1 G CAPS Take 1 g by mouth daily.   Yes Historical Provider, MD  levETIRAcetam (KEPPRA) 500 MG tablet Take 1 tablet (500 mg total) by mouth every 12 (twelve) hours. 02/19/13  Yes Dennie Bible, NP  simvastatin (ZOCOR) 40 MG tablet Take 40 mg by mouth daily.   Yes Historical Provider, MD  Vitamin D, Ergocalciferol, (DRISDOL) 50000 UNITS CAPS Take 50,000 Units by mouth 2 (two) times a week. Takes 1 capsule on Wednesday and Saturday every week   Yes Historical Provider, MD   BP 147/66  Pulse 68  Temp(Src) 98.1 F (36.7 C) (Oral)  Resp 17  SpO2 95% Physical Exam  Constitutional: He is oriented to person, place, and time. He appears well-developed and well-nourished. No distress.  HENT:  Head: Normocephalic and atraumatic.  Mouth/Throat: No oropharyngeal exudate.  Eyes: Pupils are equal, round, and reactive to light.  Neck: Normal range of motion. Neck supple.  Cardiovascular: Normal rate, regular rhythm and normal heart sounds.  Exam reveals no gallop and no friction rub.   No murmur  heard. Pulmonary/Chest: Effort normal and breath sounds normal. No respiratory distress. He has no wheezes. He has no rales.    Abdominal: Soft. Bowel sounds are normal. He exhibits no distension and no mass. There is no tenderness. There is no rebound and no guarding.  Musculoskeletal: Normal range of motion. He exhibits no edema and no tenderness.  Neurological: He is alert and oriented to person, place, and time.  Skin: Skin is warm and dry.  Psychiatric: He has a normal mood and affect.    ED Course  Procedures (including critical care time) Labs Review Labs Reviewed  URINALYSIS, ROUTINE W REFLEX MICROSCOPIC - Abnormal; Notable for the following:    Protein, ur 100 (*)    All other components within normal limits  CBC WITH DIFFERENTIAL - Abnormal; Notable for the following:    RBC 3.65 (*)    Hemoglobin 9.7 (*)    HCT 32.1 (*)    RDW 18.2 (*)    Platelets 133 (*)    Monocytes Relative 22 (*)    Monocytes Absolute 1.4 (*)    All  other components within normal limits  COMPREHENSIVE METABOLIC PANEL - Abnormal; Notable for the following:    Glucose, Bld 108 (*)    Albumin 2.6 (*)    GFR calc non Af Amer 78 (*)    All other components within normal limits  URINE MICROSCOPIC-ADD ON - Abnormal; Notable for the following:    Bacteria, UA FEW (*)    Casts HYALINE CASTS (*)    All other components within normal limits  URINE CULTURE  TROPONIN I  Randolm Idol, ED    Imaging Review Dg Chest 2 View  07/15/2013   CLINICAL DATA:  Altered mental status.  EXAM: CHEST  2 VIEW  COMPARISON:  DG CHEST 2 VIEW dated 07/07/2013  FINDINGS: The right lung is adequately inflated and clear. On the left there is persistent volume loss. There is increased density noted posteriorly on the lateral film consistent with atelectasis or pneumonia in the left lower lobe. There is persistent pleural thickening in the upper aspect of the major fissure on the left. A small left pleural effusion is present  and stable. The cardiopericardial silhouette is enlarged. The pulmonary vascularity is mildly prominent centrally.  IMPRESSION: 1. There is persistent increased density in the left mid and lower lung which may reflect atelectasis or pneumonia. There is a small left pleural effusion. Overall these findings have not greatly changed since the previous study. 2. The cardiopericardial silhouette remains enlarged. The central pulmonary vascularity is more prominent today but this may be related to the AP technique.   Electronically Signed   By: David  Martinique   On: 07/15/2013 12:49   Ct Head Wo Contrast  07/15/2013   CLINICAL DATA:  Change in mental status. Diabetic hypertensive patient with history of seizures and atrial fibrillation.  EXAM: CT HEAD WITHOUT CONTRAST  TECHNIQUE: Contiguous axial images were obtained from the base of the skull through the vertex without intravenous contrast.  COMPARISON:  03/06/2012 CT and MR.  FINDINGS: No intracranial hemorrhage.  Remote moderate size left parietal/posterior left operculum infarct with encephalomalacia.  Remote infarct adjacent to the right caudate head.  Small vessel disease type changes without CT evidence of large acute infarct.  Vascular calcifications.  Mild atrophy without hydrocephalus.  No intracranial mass lesion noted on this unenhanced exam.  Almost complete opacification maxillary sinuses. Prominent opacification/mucosal thickening ethmoid sinus air cells. Prominent mucosal thickening/almost complete opacification sphenoid sinus air cells. No obvious intracranial or intraorbital extension of sinus disease.  Exophthalmos.  Congenital dehiscence versus and result of prior trauma medial wall right orbit.  IMPRESSION: Small vessel disease type changes and remote infarcts as detailed above without CT evidence of large acute infarct.  No intracranial hemorrhage.  Paranasal sinus disease as detailed above.  Vascular calcifications.   Electronically Signed   By:  Chauncey Cruel M.D.   On: 07/15/2013 15:43     EKG Interpretation   Date/Time:  Tuesday Jul 15 2013 11:08:08 EDT Ventricular Rate:  77 PR Interval:  197 QRS Duration: 108 QT Interval:  370 QTC Calculation: 419 R Axis:   12 Text Interpretation:  Sinus rhythm Ventricular premature complex  Borderline repolarization abnormality Baseline wander in lead(s) V1  Confirmed by Taneal Sonntag  MD, Olar ([id number]) on 07/15/2013 12:14:50 PM      MDM   Final diagnoses:  Fatigue    Pt is a 80 y.o. male with Pmhx as above who presents with malaise, anorexia for about 24 hrs, and an episode fo diaphoresis at home. Pt denies  known fever, chills, states cough from recent pna is improved. Denies CP, AB pain, d/a, dysuria, though has urinary incontinence. No recent change in gait, no focal neuro complaints. On PE, VSS, GCS 15, in NAD.  Chest tube site well appearing on L lateral chest wall. Cardiopulm exam benign. No focal neuro findings. CXR with unchanged L lung density which i believe is likely residual hemothorax given no fever, nml WBC count, no reports of CP, cough, or SOB.  CT head w/o acute findings. Urine not infected. CBC stable. Pt has tolerate PO in dept and is well appearing. I have asked him to f/u with his PCP ASAP and return to the ED for new or worsening symptoms.         Neta Ehlers, MD 07/15/13 2055

## 2013-07-15 NOTE — ED Notes (Signed)
Pt arrived from home by PTAR. Pt became diaphoretic and pt girlfriend stated that he is not acting like himself. Pt is A&O, denies any pain, n/v. Grip strengths are equal. BP-142/70 HR-72. CBG-191 PTAR stated that sons were at house and did not seem concerned.

## 2013-07-16 ENCOUNTER — Telehealth: Payer: Self-pay | Admitting: Cardiology

## 2013-07-16 LAB — URINE CULTURE
COLONY COUNT: NO GROWTH
Culture: NO GROWTH

## 2013-07-16 LAB — PATHOLOGIST SMEAR REVIEW

## 2013-07-16 NOTE — Telephone Encounter (Signed)
Reviewing [facility] chart for patient.  Patient had chest xray and ct scan ordered by pulmn.

## 2013-07-16 NOTE — Telephone Encounter (Signed)
RN spoke to Mrs Terry Robertson.  She states that she took him to the ER YESTERDAY, due to his fatigue , sleeping all the time and no appetite.  She states -CT OF HEAD WAS DONE EKG- showed some DEMENTIA per Mrs Terry Robertson. Mrs Terry Robertson wanted Brittainy PA to be aware.  RN forward to [employer].

## 2013-07-16 NOTE — Telephone Encounter (Signed)
Open error 

## 2013-07-16 NOTE — Telephone Encounter (Signed)
Returning call about Terry Robertson having a CT scan.. Please call back at [phone].Marland Kitchen Thanks

## 2013-07-20 ENCOUNTER — Encounter: Payer: Self-pay | Admitting: Cardiology

## 2013-07-20 NOTE — Assessment & Plan Note (Signed)
Controlled.  

## 2013-07-20 NOTE — Assessment & Plan Note (Signed)
Coumadin currently on hold. Resume if repeat CT scan is stable.

## 2013-07-20 NOTE — Assessment & Plan Note (Signed)
EKG today demonstrates NSR. Continue atenolol and diltiazem for rate control. His CHA2DS2 VASc score is 7. If repeat CT scan is clear, then he will likely need to resume Coumadin for stroke prophylaxis.

## 2013-07-20 NOTE — Assessment & Plan Note (Signed)
Warfarin on hold. Repeat CT scan in 3 weeks to re-evaluate.

## 2013-07-20 NOTE — Assessment & Plan Note (Signed)
Stable. Denies chest pain.

## 2013-07-29 ENCOUNTER — Ambulatory Visit (INDEPENDENT_AMBULATORY_CARE_PROVIDER_SITE_OTHER): Payer: Medicare HMO | Admitting: Pulmonary Disease

## 2013-07-29 ENCOUNTER — Encounter: Payer: Self-pay | Admitting: Pulmonary Disease

## 2013-07-29 VITALS — BP 158/88 | HR 86 | Ht 65.0 in | Wt 240.0 lb

## 2013-07-29 DIAGNOSIS — J90 Pleural effusion, not elsewhere classified: Secondary | ICD-10-CM

## 2013-07-29 DIAGNOSIS — J942 Hemothorax: Secondary | ICD-10-CM

## 2013-07-29 NOTE — Progress Notes (Signed)
Subjective:    Patient ID: Elza Rafter, male    DOB: Sep 19, 1928, 80 y.o.   MRN: [id number]  Synopsis: Mr. Demarais had a hemothorax in 06/2013 for CAP and a hemothorax while on warfarin.  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  HPI  07/29/2013 ROV > Mr. Schwinn has been doing well since the last visit. He says that his shortness of breath has improved. He has not had chest pain or weight loss. He has not had hemoptysis. He says that since coming home from the hospital he feels that his overall health is been improving. However, when he reflects back on the last year he says that he has become more fatigued in the last 12 months. His weight has been stable during that time.  Past Medical History  Diagnosis Date  . Diabetes mellitus   . Hypertension   . Stroke 08/2011, 03/2012    L MCA (Afib RVR while admitted for hypotension) - expressive aphasia (almost totally recovered)  . Seizures     Post CVA; on Keppra  . OSA on CPAP   . Paroxysmal atrial fibrillation     on coumadin; Diagnosed at the time of his 1st CVA  . Obesity, morbid     BMI  . Heart attack 1997    inferior MI with left circumflex stent  . CAD S/P percutaneous coronary angioplasty 1997    status post remote PCI to the LAD and circumflex in 1997, heart cath in 2008 showed widely patent stents in the circumflex and LAD.  RCA had a mid lesion and then was totally occluded distally.  . Myelodysplastic syndrome     w/mild anemia an neutropenia  . BPH (benign prostatic hyperplasia)     TURP in 12/2010  . CHF (congestive heart failure)   . Asbestosis     family unclear where he was evaluated in the past     Review of Systems     Objective:   Physical Exam Filed Vitals:   07/29/13 1054  BP: 158/88  Pulse: 86  Height: 5\' 5"  (1.651 m)  Weight: 240 lb (108.863 kg)  SpO2: 96%  RA  Gen: well appearing, no acute distress HEENT: NCAT,EOMi, OP clear PULM: CTA B CV: RRR, no mgr, no  JVD AB: BS+, soft, nontender, no hsm Ext: warm, no edema, no clubbing, no cyanosis Derm: no rash or skin breakdown Neuro: A&Ox4, CN II-XII intact, strength 5/5 in all 4 extremities       Assessment & Plan:   Hemothorax, left We believe that the reason why Mr. Fykes had a hemothorax was made her pneumonia in the setting of warfarin use. However, he had pleural thickening which was concerning to me considering his known asbestos exposure. 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  Plan: -Repeat CT chest to evaluate for pleural pathology -If CT chest looks fine then followup with me in one year with a chest x-ray    Updated Medication List Outpatient Encounter Prescriptions as of 07/29/2013  Medication Sig  . albuterol (PROVENTIL HFA;VENTOLIN HFA) 108 (90 BASE) MCG/ACT inhaler Inhale 2 puffs into the lungs every 6 (six) hours as needed for wheezing or shortness of breath.  Marland Kitchen aspirin EC 81 MG tablet Take 81 mg by mouth daily.  Marland Kitchen atenolol (TENORMIN) 25 MG tablet Take 12.5 mg  by mouth daily.  Marland Kitchen diltiazem (DILACOR XR) 180 MG 24 hr capsule Take 180 mg by mouth daily.  . fish oil-omega-3 fatty acids 1000 MG capsule Take 1 g by mouth daily.  . furosemide (LASIX) 20 MG tablet Take 1 tablet (20 mg total) by mouth daily with breakfast.  . Icosapent Ethyl (VASCEPA) 1 G CAPS Take 1 g by mouth daily.  Marland Kitchen levETIRAcetam (KEPPRA) 500 MG tablet Take 1 tablet (500 mg total) by mouth every 12 (twelve) hours.  . Vitamin D, Ergocalciferol, (DRISDOL) 50000 UNITS CAPS Take 50,000 Units by mouth 2 (two) times a week. Takes 1 capsule on Wednesday and Saturday every week  . [DISCONTINUED] simvastatin (ZOCOR) 40 MG tablet Take 40 mg by mouth daily.

## 2013-07-29 NOTE — Assessment & Plan Note (Signed)
We believe that the reason why Mr. Fykes had a hemothorax was made her pneumonia in the setting of warfarin use. However, he had pleural thickening which was concerning to me considering his known asbestos exposure. 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  Plan: -Repeat CT chest to evaluate for pleural pathology -If CT chest looks fine then followup with me in one year with a chest x-ray

## 2013-07-31 ENCOUNTER — Ambulatory Visit (INDEPENDENT_AMBULATORY_CARE_PROVIDER_SITE_OTHER)
Admission: RE | Admit: 2013-07-31 | Discharge: 2013-07-31 | Disposition: A | Discharge status: Home / Self Care | Payer: Medicare HMO | Source: Ambulatory Visit | Attending: Pulmonary Disease | Admitting: Pulmonary Disease

## 2013-07-31 DIAGNOSIS — J942 Hemothorax: Secondary | ICD-10-CM

## 2013-07-31 DIAGNOSIS — J90 Pleural effusion, not elsewhere classified: Secondary | ICD-10-CM

## 2013-08-01 ENCOUNTER — Encounter: Payer: Self-pay | Admitting: Pulmonary Disease

## 2013-08-01 ENCOUNTER — Telehealth: Payer: Self-pay | Admitting: Pulmonary Disease

## 2013-08-01 NOTE — Telephone Encounter (Signed)
Spoke with pt's girlfriend (DPR on file).  They are aware of results.  Nothing further needed at this time.

## 2013-08-01 NOTE — Progress Notes (Signed)
Quick Note:  Spoke with pt's girlfriend Geradine Girt (DPR on file) to relay results and recs. Recall has been placed. Nothing further needed at this time. ______

## 2013-08-06 ENCOUNTER — Ambulatory Visit (HOSPITAL_BASED_OUTPATIENT_CLINIC_OR_DEPARTMENT_OTHER): Payer: Medicare HMO | Admitting: Internal Medicine

## 2013-08-06 ENCOUNTER — Telehealth: Payer: Self-pay | Admitting: Internal Medicine

## 2013-08-06 ENCOUNTER — Other Ambulatory Visit (HOSPITAL_BASED_OUTPATIENT_CLINIC_OR_DEPARTMENT_OTHER): Payer: Medicare HMO

## 2013-08-06 VITALS — BP 174/79 | HR 81 | Temp 98.3°F | Resp 20 | Ht 65.0 in | Wt 239.4 lb

## 2013-08-06 DIAGNOSIS — R142 Eructation: Secondary | ICD-10-CM

## 2013-08-06 DIAGNOSIS — R141 Gas pain: Secondary | ICD-10-CM

## 2013-08-06 DIAGNOSIS — D469 Myelodysplastic syndrome, unspecified: Secondary | ICD-10-CM

## 2013-08-06 DIAGNOSIS — R143 Flatulence: Secondary | ICD-10-CM

## 2013-08-06 DIAGNOSIS — I4891 Unspecified atrial fibrillation: Secondary | ICD-10-CM

## 2013-08-06 DIAGNOSIS — D759 Disease of blood and blood-forming organs, unspecified: Secondary | ICD-10-CM

## 2013-08-06 DIAGNOSIS — D696 Thrombocytopenia, unspecified: Secondary | ICD-10-CM

## 2013-08-06 LAB — CBC WITH DIFFERENTIAL/PLATELET
BASO%: 0 % (ref 0.0–2.0)
BASOS ABS: 0 10*3/uL (ref 0.0–0.1)
EOS%: 0.4 % (ref 0.0–7.0)
Eosinophils Absolute: 0 10*3/uL (ref 0.0–0.5)
HEMATOCRIT: 35 % — AB (ref 38.4–49.9)
HEMOGLOBIN: 10.8 g/dL — AB (ref 13.0–17.1)
LYMPH%: 52.7 % — AB (ref 14.0–49.0)
MCH: 26.1 pg — AB (ref 27.2–33.4)
MCHC: 30.9 g/dL — ABNORMAL LOW (ref 32.0–36.0)
MCV: 84.5 fL (ref 79.3–98.0)
MONO#: 0.9 10*3/uL (ref 0.1–0.9)
MONO%: 34.7 % — AB (ref 0.0–14.0)
NEUT#: 0.3 10*3/uL — CL (ref 1.5–6.5)
NEUT%: 12.2 % — ABNORMAL LOW (ref 39.0–75.0)
Platelets: 67 10*3/uL — ABNORMAL LOW (ref 140–400)
RBC: 4.14 10*6/uL — ABNORMAL LOW (ref 4.20–5.82)
RDW: 16.8 % — AB (ref 11.0–14.6)
WBC: 2.5 10*3/uL — ABNORMAL LOW (ref 4.0–10.3)
lymph#: 1.3 10*3/uL (ref 0.9–3.3)
nRBC: 0 % (ref 0–0)

## 2013-08-06 LAB — COMPREHENSIVE METABOLIC PANEL (CC13)
ALK PHOS: 79 U/L (ref 40–150)
ALT: 11 U/L (ref 0–55)
AST: 18 U/L (ref 5–34)
Albumin: 3.4 g/dL — ABNORMAL LOW (ref 3.5–5.0)
Anion Gap: 15 mEq/L — ABNORMAL HIGH (ref 3–11)
BUN: 8.1 mg/dL (ref 7.0–26.0)
CO2: 20 mEq/L — ABNORMAL LOW (ref 22–29)
CREATININE: 0.9 mg/dL (ref 0.7–1.3)
Calcium: 8.9 mg/dL (ref 8.4–10.4)
Chloride: 106 mEq/L (ref 98–109)
Glucose: 89 mg/dl (ref 70–140)
Potassium: 3.9 mEq/L (ref 3.5–5.1)
Sodium: 141 mEq/L (ref 136–145)
Total Bilirubin: 0.45 mg/dL (ref 0.20–1.20)
Total Protein: 9.2 g/dL — ABNORMAL HIGH (ref 6.4–8.3)

## 2013-08-06 LAB — LACTATE DEHYDROGENASE (CC13): LDH: 261 U/L — AB (ref 125–245)

## 2013-08-06 NOTE — Patient Instructions (Signed)
Filgrastim, G-CSF injection What is this medicine? FILGRASTIM, G-CSF (fil GRA stim) stimulates the formation of white blood cells. This medicine is given to patients with conditions that may cause a decrease in white blood cells, like those receiving certain types of chemotherapy or bone marrow transplant. It helps the bone marrow recover its ability to produce white blood cells. Increasing the amount of white blood cells helps to decrease the risk of infection and fever. This medicine may be used for other purposes; ask your health care provider or pharmacist if you have questions. COMMON BRAND NAME(S): Neupogen What should I tell my health care provider before I take this medicine? They need to know if you have any of these conditions: -currently receiving radiation therapy -sickle cell disease -an unusual or allergic reaction to filgrastim, E. coli protein, other medicines, foods, dyes, or preservatives -pregnant or trying to get pregnant -breast-feeding How should I use this medicine? This medicine is for injection into a vein or injection under the skin. It is usually given by a health care professional in a hospital or clinic setting. If you get this medicine at home, you will be taught how to prepare and give this medicine. Always change the site for the injection under the skin. Let the solution warm to room temperature before you use it. Do not shake the solution before you withdraw a dose. Throw away any unused portion. Use exactly as directed. Take your medicine at regular intervals. Do not take your medicine more often than directed. It is important that you put your used needles and syringes in a special sharps container. Do not put them in a trash can. If you do not have a sharps container, call your pharmacist or healthcare provider to get one. Talk to your pediatrician regarding the use of this medicine in children. While this medicine may be prescribed for children for selected  conditions, precautions do apply. Overdosage: If you think you have taken too much of this medicine contact a poison control center or emergency room at once. NOTE: This medicine is only for you. Do not share this medicine with others. What if I miss a dose? Try not to miss doses. If you miss a dose take the dose as soon as you remember. If it is almost time for the next dose, do not take double doses unless told to by your doctor or health care professional. What may interact with this medicine? -lithium -medicines for cancer chemotherapy This list may not describe all possible interactions. Give your health care provider a list of all the medicines, herbs, non-prescription drugs, or dietary supplements you use. Also tell them if you smoke, drink alcohol, or use illegal drugs. Some items may interact with your medicine. What should I watch for while using this medicine? Visit your doctor or health care professional for regular checks on your progress. If you get a fever or any sign of infection while you are using this medicine, do not treat yourself. Check with your doctor or health care professional. Bone pain can usually be relieved by mild pain relievers such as acetaminophen or ibuprofen. Check with your doctor or health care professional before taking these medicines as they may hide a fever. Call your doctor or health care professional if the aches and pains are severe or do not go away. What side effects may I notice from receiving this medicine? Side effects that you should report to your doctor or health care professional as soon as possible: -allergic reactions   like skin rash, itching or hives, swelling of the face, lips, or tongue -difficulty breathing, wheezing -fever -pain, redness, or swelling at the injection site -stomach or side pain, or pain at the shoulder Side effects that usually do not require medical attention (report to your doctor or health care professional if they  continue or are bothersome): -bone pain (ribs, lower back, breast bone) -headache -skin rash This list may not describe all possible side effects. Call your doctor for medical advice about side effects. You may report side effects to FDA at 1-800-FDA-1088. Where should I keep my medicine? Keep out of the reach of children. Store in a refrigerator between 2 and 8 degrees C (36 and 46 degrees F). Do not freeze or leave in direct sunlight. If vials or syringes are left out of the refrigerator for more than 24 hours, they must be thrown away. Throw away unused vials after the expiration date on the carton. NOTE: This sheet is a summary. It may not cover all possible information. If you have questions about this medicine, talk to your doctor, pharmacist, or health care provider.  2014, Elsevier/Gold Standard. (2007-05-08 13:33:21)  

## 2013-08-06 NOTE — Telephone Encounter (Signed)
gv pt/friend appt schedule for july/aug

## 2013-08-06 NOTE — Progress Notes (Signed)
[facility] OFFICE PROGRESS NOTE  Terry Greenland, MD [address] [place] [address]  DIAGNOSIS: Myelodysplastic syndrome  Chief Complaint  Patient presents with  . Follow-up    CURRENT TREATMENT: Observation.   INTERVAL HISTORY: Terry Robertson 80 y.o. male with a history of MDS is here for follow-up visit.  He was last seen by me on 05/14/2013.  He is a gentleman who has been following since October 2009, with an isolated neutropenia. Bone marrow biopsy showed an element of both myeloproliferative as well as myelodysplastic syndrome, but overall I felt that the features are consistent with myelodysplastic syndrome. I have elected to treatment him with supportive care only for the time being. He has been completely asymptomatic. He has not reported any recurrent sign of pulmonary infection. He is not having increased blast. His cytogenetics are normal. He denies any recent emergency room visits or hospitalizations.  He has received one Neulasta injection in the past and has helped his blood count dramatically and it is good to know that he has a good response to it down the line.  He has not reported any peripheral neuropathy. He has not reported any other complaints. He was admitted to [facility] back in April (04/18 -04/25) for acute hypoxic respiratory failure, left hemothorax and pneumonia complicated by hemorrhagic transformation secondary to coumadin.  He is still off his coumadin.  He is complaining of MSK chest pain.  He has had a previous heart attack and states this is different and related to his position and possibly gas.    MEDICAL HISTORY: Past Medical History  Diagnosis Date  . Diabetes mellitus   . Hypertension   . Stroke 08/2011, 03/2012    L MCA (Afib RVR while admitted for hypotension) - expressive aphasia (almost totally recovered)  . Seizures     Post CVA; on Keppra  . OSA on CPAP   . Paroxysmal atrial fibrillation     on  coumadin; Diagnosed at the time of his 1st CVA  . Obesity, morbid     BMI  . Heart attack 1997    inferior MI with left circumflex stent  . CAD S/P percutaneous coronary angioplasty 1997    status post remote PCI to the LAD and circumflex in 1997, heart cath in 2008 showed widely patent stents in the circumflex and LAD.  RCA had a mid lesion and then was totally occluded distally.  . Myelodysplastic syndrome     w/mild anemia an neutropenia  . BPH (benign prostatic hyperplasia)     TURP in 12/2010  . CHF (congestive heart failure)   . Asbestosis     family unclear where he was evaluated in the past    INTERIM HISTORY: has Hypotension - resolved; due to poor PO intake with continued Antihypertensive therapy; CAD (coronary artery disease) PCI to circumflex in 1997.  100% occluded RCA; Morbid obesity; Myelodysplastic syndrome; Dyslipidemia; HTN (hypertension), controlled; S/P TURP Oct 2012; DM type 2 (diabetes mellitus, type 2); Diastolic dysfunction: Grade 2; Thrombocytopenia, chronic; Acute stroke - possibly  cardioembolic (04/11/7122); Hx of AF, NSR presently; Acute respiratory failure; Altered mental status, suspect post ictal; Seizure, resolved; Fever, unclear etiology; Pulmonary edema; CVA (cerebral infarction); Long term (current) use of anticoagulants; Other convulsions; Severe obesity (BMI >= 40); PAF (paroxysmal atrial fibrillation); Seizure disorder; CAP (community acquired pneumonia); Pneumonia; and Hemothorax, left on his problem list.    ALLERGIES:  has No Known Allergies.  MEDICATIONS: has a current medication  list which includes the following prescription(s): albuterol, aspirin ec, atenolol, bisacodyl, diltiazem, fish oil-omega-3 fatty acids, furosemide, hydrocodone-acetaminophen, icosapent ethyl, levetiracetam, simvastatin, and vitamin d (ergocalciferol).  SURGICAL HISTORY:  Past Surgical History  Procedure Laterality Date  . Transthoracic echocardiogram  08/06/11    EF 55% to 65%  with grade2 diastolic dysfunction/pseudonormal with mildly dilated left atrium, had evidence of elevated filling ressures in the left vientricle and left atrium  . Cardiac catheterization  02/08/07    widely patent stent in the circumflex and LAD.  RCA had a mid lesion and then was totally occluded distally. Cook bare-metal 3.5x20 mm stent  . Transurethral resection of prostate  12/2010  . Nm myoview ltd  01/09/1999    Inferior wall thinning with possible mild ischemia versus infarct. This would correlate well with his known RCA occlusion    REVIEW OF SYSTEMS:   Constitutional: Denies fevers, chills or abnormal weight loss Eyes: Denies blurriness of vision Ears, nose, mouth, throat, and face: Denies mucositis or sore throat Respiratory: Denies cough, dyspnea or wheezes Cardiovascular: Denies palpitation, chest discomfort or lower extremity swelling Gastrointestinal:  Denies nausea, heartburn or change in bowel habits Skin: Denies abnormal skin rashes Lymphatics: Denies new lymphadenopathy or easy bruising Neurological:Denies numbness, tingling or new weaknesses Behavioral/Psych: Mood is stable, no new changes  All other systems were reviewed with the patient and are negative.  PHYSICAL EXAMINATION: ECOG PERFORMANCE STATUS: 1 - Symptomatic but completely ambulatory  Blood pressure 174/79, pulse 81, temperature 98.3 F (36.8 C), temperature source Oral, resp. rate 20, height _0  (1.651 m), weight 239 lb 6.4 oz (108.591 kg), SpO2 100.00%.  GENERAL:alert, no distress and comfortable; ambulates with a cane SKIN: skin color, texture, turgor are normal, no rashes or significant lesions EYES: normal, Conjunctiva are pink and non-injected, sclera clear OROPHARYNX:no exudate, no erythema and lips, buccal mucosa, and tongue normal  NECK: supple, thyroid normal size, non-tender, without nodularity LYMPH:  no palpable lymphadenopathy in the cervical, axillary or supraclavicular LUNGS: clear to  auscultation with normal breathing effort, no wheezes or rhonchi HEART: regular rate & rhythm and no murmurs and no lower extremity edema ABDOMEN:abdomen soft, non-tender and normal bowel sounds Musculoskeletal:no cyanosis of digits and no clubbing  NEURO: alert & oriented x 3 with fluent speech, no focal motor/sensory deficits  Labs:  Lab Results  Component Value Date   WBC 2.5* 08/06/2013   HGB 10.8* 08/06/2013   HCT 35.0* 08/06/2013   MCV 84.5 08/06/2013   PLT 67* 08/06/2013   NEUTROABS 0.3* 08/06/2013      Chemistry      Component Value Date/Time   NA 141 07/15/2013 1208   NA 139 01/02/2012 1526   K 3.8 07/15/2013 1208   K 3.8 01/02/2012 1526   CL 104 07/15/2013 1208   CL 109* 01/02/2012 1526   CO2 24 07/15/2013 1208   CO2 26 01/02/2012 1526   BUN 10 07/15/2013 1208   BUN 12.0 01/02/2012 1526   CREATININE 0.88 07/15/2013 1208   CREATININE 1.0 01/02/2012 1526      Component Value Date/Time   CALCIUM 8.6 07/15/2013 1208   CALCIUM 9.2 01/02/2012 1526   ALKPHOS 56 07/15/2013 1208   ALKPHOS 52 01/02/2012 1526   AST 19 07/15/2013 1208   AST 14 01/02/2012 1526   ALT 18 07/15/2013 1208   ALT 8 01/02/2012 1526   BILITOT 0.4 07/15/2013 1208   BILITOT 0.40 01/02/2012 1526       GFR Estimated Creatinine Clearance: 73.5  ml/min (by C-G formula based on Cr of 0.88). Coagulation profile No results found for this basename: INR, PROTIME,  in the last 168 hours  Studies:  No results found.   RADIOGRAPHIC STUDIES: No results found.  ASSESSMENT: Mayjor Ager 80 y.o. male with a history of Myelodysplastic syndrome   PLAN:   1. Myelodysplastic/myeloproliferative disorder, JAK2 mutation is negative.  --His cytogenetics normal, but for the time being it is more likely myelodysplastic syndrome more than anything else. We will continue supportive measures at this time. He does not need any growth factor support unless he has a symptomatology such as recurrent infection etc. We will continue to  follow him every couple of months. If he has any additional bacterial infections, we will start G-CSF to support his WBCs.    2. Cytopenias:  --No need for any transfusions or growth factor support at this time. As noted in #1. His hemoglobin is 10.8, plts 67.   3. Gas. --He notes frequent belching.  We advised simethicone but it chest pain persists or worsens, he should be evaluated promptly by his cardiologists or emergency room.  He voiced understanding.   4. Atrial Fibrillation. --He was admitted to [facility] back in April (04/18 -04/25) for acute hypoxic respiratory failure, left hemothorax and pneumonia complicated by hemorrhagic transformat  5. Follow-up. --He will follow up in 1 months for repeat labs including CBC, chemistries and LDH and in 2 months for symptom visit and consideration of growth factor support.   All questions were answered. The patient knows to call the clinic with any problems, questions or concerns. We can certainly see the patient much sooner if necessary.  I spent 15 minutes counseling the patient face to face. The total time spent in the appointment was 25 minutes.    Concha Norway, MD 08/06/2013 2:00 PM

## 2013-08-11 ENCOUNTER — Ambulatory Visit: Payer: Medicare HMO | Admitting: Podiatry

## 2013-08-14 ENCOUNTER — Encounter: Payer: Self-pay | Admitting: Cardiology

## 2013-08-14 ENCOUNTER — Ambulatory Visit (INDEPENDENT_AMBULATORY_CARE_PROVIDER_SITE_OTHER): Payer: Medicare HMO | Admitting: Cardiology

## 2013-08-14 ENCOUNTER — Telehealth: Payer: Self-pay | Admitting: Pharmacist Clinician (PhC)/ Clinical Pharmacy Specialist

## 2013-08-14 VITALS — BP 156/83 | HR 92 | Ht 65.0 in | Wt 241.5 lb

## 2013-08-14 DIAGNOSIS — I5189 Other ill-defined heart diseases: Secondary | ICD-10-CM

## 2013-08-14 DIAGNOSIS — I519 Heart disease, unspecified: Secondary | ICD-10-CM

## 2013-08-14 DIAGNOSIS — J942 Hemothorax: Secondary | ICD-10-CM

## 2013-08-14 DIAGNOSIS — I10 Essential (primary) hypertension: Secondary | ICD-10-CM

## 2013-08-14 DIAGNOSIS — Z7901 Long term (current) use of anticoagulants: Secondary | ICD-10-CM

## 2013-08-14 DIAGNOSIS — I480 Paroxysmal atrial fibrillation: Secondary | ICD-10-CM

## 2013-08-14 DIAGNOSIS — I4891 Unspecified atrial fibrillation: Secondary | ICD-10-CM

## 2013-08-14 DIAGNOSIS — J90 Pleural effusion, not elsewhere classified: Secondary | ICD-10-CM

## 2013-08-14 DIAGNOSIS — I2510 Atherosclerotic heart disease of native coronary artery without angina pectoris: Secondary | ICD-10-CM

## 2013-08-14 DIAGNOSIS — J189 Pneumonia, unspecified organism: Secondary | ICD-10-CM

## 2013-08-14 MED ORDER — WARFARIN SODIUM 5 MG PO TABS: 5.0000 mg | ORAL_TABLET | Freq: Every day | ORAL | Status: DC

## 2013-08-14 NOTE — Assessment & Plan Note (Signed)
No angina 

## 2013-08-14 NOTE — Assessment & Plan Note (Signed)
No CHF symptoms

## 2013-08-14 NOTE — Assessment & Plan Note (Signed)
Resumed with INR goal of 2-2.5

## 2013-08-14 NOTE — Patient Instructions (Signed)
1. Take coumadin 7.5 mg Mon,wed,fri and take 5 mg tues,thur,sat,sun  2. Have your coumadin checked on Monday  3.Goal for INR is 2.0 -2.5  4. Your physician recommends that you schedule a follow-up appointment in:  One month with Dr. Ellyn Hack

## 2013-08-14 NOTE — Assessment & Plan Note (Signed)
07/2013 CT chest > improved 07/31/13-  nodular thickening of pleura consistent with asbestos exposure

## 2013-08-14 NOTE — Assessment & Plan Note (Signed)
Controlled.  

## 2013-08-14 NOTE — Telephone Encounter (Signed)
Message left on machine by [name], regarding restarting of warfarin for [employer].  Wanted to know what time of day he should be taking.  Returned call to Mr. Ruehl [name] left no phone #), explained that he should take warfarin with evening medications.  Pt voiced understanding.

## 2013-08-14 NOTE — Progress Notes (Signed)
08/14/2013 Terry Robertson   [birth year]  [id number]  Primary Physicia [name], MD Primary Cardiologist: Dr Ellyn Hack  HPI:  The patient is a 80 y/o male, followed by Dr. Ellyn Hack, with a history significant for CAD, s/p MI in 1997 resulting in PCI + stenting of the LAD and circumflex. Repeat LHC in 2008 demonstrated widely patent stents in the circumflex and LAD. The RCA had a mid lesion and then was totally occluded distally. His most recent 2D echo was June 2013, demonstrating normal systolic function, with an EF of 55-65% w/o WMA and also with Grade II diastolic dysfunction. He also has a history of PAF, as well as h/o stroke, HTN, HLD and Myelodysplastic syndrome. He has been on chronic Warfarin for anticoagulation (CHA2DS2-VASc score of 7 ).  He presents to clinic today for post-hospital f/u. He was admitted to [facility] from 06/21/13 - 06/28/13. He presented with a complaint of generalized weakness and fever. Temp in ER was 103. CXR was worrisome for PNA and also showed a large left pleural effusion. After insertion of a chest tube for drainage, this was discovered to be a parpneumonic/hemothorax. His INR on arrival was 2.4. His warfarin was discontinued and he was given FFP x 4 and vitamin K for INR reversal. Cardiothoracic surgery was consulted with concerns over malignancy possibility, however fluid cytology was negative for malignancy. There was no indication for VATS. Dr. Cyndia Bent recommended that Coumadin be held until completely resolved, as he felt that the risk of bleeding is greater that the risk of stroke at this time. He recommended repeating a CT scan in 1 month to ensure that everything clears up before restarting Coumadin.Dr. Cyndia Bent felt that he would have continued improvement with pulmonary toilet and antibiotics.    The pt is here today for follow up. He is doing better. His CT 07/31/13 was improved. I resumed his Coumadin with a goal INR of 2-2.5.     Current Outpatient Prescriptions    Medication Sig Dispense Refill  . albuterol (PROVENTIL HFA;VENTOLIN HFA) 108 (90 BASE) MCG/ACT inhaler Inhale 2 puffs into the lungs every 6 (six) hours as needed for wheezing or shortness of breath.  1 Inhaler  2  . aspirin EC 81 MG tablet Take 81 mg by mouth daily.      Marland Kitchen atenolol (TENORMIN) 25 MG tablet Take 12.5 mg by mouth daily.      Marland Kitchen atorvastatin (LIPITOR) 20 MG tablet Take 20 mg by mouth daily.      . bisacodyl (DULCOLAX) 5 MG EC tablet Take 5 mg by mouth daily as needed for moderate constipation.      Marland Kitchen diltiazem (DILACOR XR) 180 MG 24 hr capsule Take 180 mg by mouth daily.      . fish oil-omega-3 fatty acids 1000 MG capsule Take 1 g by mouth daily.      . furosemide (LASIX) 20 MG tablet Take 1 tablet (20 mg total) by mouth daily with breakfast.  30 tablet  1  . HYDROcodone-acetaminophen (NORCO/VICODIN) 5-325 MG per tablet Take 1 tablet by mouth every 4 (four) hours as needed for moderate pain.      Vanessa Kick Ethyl (VASCEPA) 1 G CAPS Take 1 g by mouth daily.      Marland Kitchen levETIRAcetam (KEPPRA) 500 MG tablet Take 1 tablet (500 mg total) by mouth every 12 (twelve) hours.  60 tablet  6  . Vitamin D, Ergocalciferol, (DRISDOL) 50000 UNITS CAPS Take 50,000 Units by mouth 2 (two) times a  week. Takes 1 capsule on Wednesday and Saturday every week      . warfarin (COUMADIN) 5 MG tablet Take 1 tablet (5 mg total) by mouth daily.  90 tablet  3   No current facility-administered medications for this visit.    No Known Allergies  History   Social History  . Marital Status: Widowed    Spouse Name: N/A    Number of Children: 19  . Years of Education: 11   Occupational History  . Not on file.   Social History Main Topics  . Smoking status: Former Smoker    Types: Cigarettes    Quit date: 03/08/1988  . Smokeless tobacco: Never Used  . Alcohol Use: No  . Drug Use: No  . Sexual Activity: Not on file   Other Topics Concern  . Not on file   Social History Narrative   Pt is widowed  father of 58, 25 grandchildren, 70 great-grandchildren and 3 great-great-grandchildren.  He does not get routine exercise.   Patient has a high school education.   Patient is right-handed.   Patient drinks one cup of coffee daily.     Review of Systems: General: negative for chills, fever, night sweats or weight changes.  Cardiovascular: negative for chest pain, dyspnea on exertion, edema, orthopnea, palpitations, paroxysmal nocturnal dyspnea or shortness of breath Dermatological: negative for rash Respiratory: negative for cough or wheezing Urologic: negative for hematuria Abdominal: negative for nausea, vomiting, diarrhea, bright red blood per rectum, melena, or hematemesis Neurologic: negative for visual changes, syncope, or dizziness All other systems reviewed and are otherwise negative except as noted above.    Blood pressure 156/83, pulse 92, height 5\' 5"  (1.651 m), weight 241 lb 8 oz (109.544 kg).  General appearance: alert, cooperative and no distress Lungs: clear to auscultation bilaterally Heart: regular rate and rhythm  EKG NSR, PVCs  ASSESSMENT AND PLAN:   CAD PCI to LAD/CFX in 1997. Cath '08- medical Rx No angina  Diastolic dysfunction: Grade 2 No CHF symptoms  HTN (hypertension), controlled Controlled  Pneumonia NSR  Long term (current) use of anticoagulants Resumed with INR goal of 2-2.5  Hemothorax, left 07/2013 CT chest > improved 07/31/13-  nodular thickening of pleura consistent with asbestos exposure    PLAN  Coumadin 7.5 mg MWF, 5 mg other day, INR Monday.  Neng Albee KPA-C 08/14/2013 6:18 PM

## 2013-08-14 NOTE — Assessment & Plan Note (Signed)
NSR

## 2013-08-18 ENCOUNTER — Ambulatory Visit (INDEPENDENT_AMBULATORY_CARE_PROVIDER_SITE_OTHER): Payer: Medicare HMO | Admitting: Pharmacist Clinician (PhC)/ Clinical Pharmacy Specialist

## 2013-08-18 DIAGNOSIS — I639 Cerebral infarction, unspecified: Secondary | ICD-10-CM

## 2013-08-18 DIAGNOSIS — Z7901 Long term (current) use of anticoagulants: Secondary | ICD-10-CM

## 2013-08-18 DIAGNOSIS — I4891 Unspecified atrial fibrillation: Secondary | ICD-10-CM

## 2013-08-18 DIAGNOSIS — I6340 Cerebral infarction due to embolism of unspecified cerebral artery: Secondary | ICD-10-CM

## 2013-08-18 LAB — POCT INR: INR: 1.1

## 2013-08-20 ENCOUNTER — Ambulatory Visit: Payer: Medicare Other | Admitting: Nurse Practitioner

## 2013-08-21 ENCOUNTER — Other Ambulatory Visit: Payer: Self-pay | Admitting: Cardiology

## 2013-08-21 ENCOUNTER — Other Ambulatory Visit: Payer: Self-pay | Admitting: *Deleted

## 2013-08-21 MED ORDER — ATENOLOL 25 MG PO TABS: 12.5000 mg | ORAL_TABLET | Freq: Every day | ORAL | Status: DC

## 2013-08-21 NOTE — Telephone Encounter (Signed)
Med refilled.

## 2013-08-25 ENCOUNTER — Ambulatory Visit (INDEPENDENT_AMBULATORY_CARE_PROVIDER_SITE_OTHER): Payer: Medicare HMO | Admitting: Pharmacist Clinician (PhC)/ Clinical Pharmacy Specialist

## 2013-08-25 ENCOUNTER — Encounter: Payer: Self-pay | Admitting: Podiatry

## 2013-08-25 ENCOUNTER — Other Ambulatory Visit: Payer: Self-pay | Admitting: Neurology

## 2013-08-25 ENCOUNTER — Ambulatory Visit (INDEPENDENT_AMBULATORY_CARE_PROVIDER_SITE_OTHER): Payer: Medicare HMO | Admitting: Podiatry

## 2013-08-25 VITALS — BP 108/82 | HR 80 | Resp 12

## 2013-08-25 DIAGNOSIS — B351 Tinea unguium: Secondary | ICD-10-CM

## 2013-08-25 DIAGNOSIS — I6340 Cerebral infarction due to embolism of unspecified cerebral artery: Secondary | ICD-10-CM

## 2013-08-25 DIAGNOSIS — M79673 Pain in unspecified foot: Secondary | ICD-10-CM

## 2013-08-25 DIAGNOSIS — M79609 Pain in unspecified limb: Secondary | ICD-10-CM

## 2013-08-25 DIAGNOSIS — I639 Cerebral infarction, unspecified: Secondary | ICD-10-CM

## 2013-08-25 DIAGNOSIS — Z7901 Long term (current) use of anticoagulants: Secondary | ICD-10-CM

## 2013-08-25 DIAGNOSIS — I4891 Unspecified atrial fibrillation: Secondary | ICD-10-CM

## 2013-08-25 LAB — POCT INR: INR: 1.2

## 2013-08-25 NOTE — Patient Instructions (Signed)
Apply Lotrimin cream twice a day to the right and left feet x30 days

## 2013-08-26 NOTE — Progress Notes (Signed)
Patient ID: Terry Robertson, male   DOB: Feb 18, 1929, 80 y.o.   MRN: [id number]  Subjective: Orientated x3  Black male presents with his wife complaining of painful toenails  Objective: Hypertrophic, elongated, discolored toenails x10  Dry scaling skin plantarly bilaterally  Assessment: Symptomatic onychomycoses x10 Tinea pedis bilaterally  Plan: Debridement of toenails x10 without a bleeding Over-the-counter Lotrimin cream recommended to apply daily x30 days  Reappoint x3 months

## 2013-09-04 ENCOUNTER — Other Ambulatory Visit (HOSPITAL_BASED_OUTPATIENT_CLINIC_OR_DEPARTMENT_OTHER): Payer: Medicare HMO

## 2013-09-04 ENCOUNTER — Ambulatory Visit: Payer: Medicare HMO | Admitting: Pharmacist Clinician (PhC)/ Clinical Pharmacy Specialist

## 2013-09-04 DIAGNOSIS — D469 Myelodysplastic syndrome, unspecified: Secondary | ICD-10-CM

## 2013-09-04 LAB — CBC WITH DIFFERENTIAL/PLATELET
BASO%: 1 % (ref 0.0–2.0)
Basophils Absolute: 0 10*3/uL (ref 0.0–0.1)
EOS ABS: 0 10*3/uL (ref 0.0–0.5)
EOS%: 0.3 % (ref 0.0–7.0)
HCT: 38.2 % — ABNORMAL LOW (ref 38.4–49.9)
HGB: 12 g/dL — ABNORMAL LOW (ref 13.0–17.1)
LYMPH%: 52.7 % — ABNORMAL HIGH (ref 14.0–49.0)
MCH: 25.8 pg — ABNORMAL LOW (ref 27.2–33.4)
MCHC: 31.5 g/dL — AB (ref 32.0–36.0)
MCV: 81.9 fL (ref 79.3–98.0)
MONO#: 1.1 10*3/uL — AB (ref 0.1–0.9)
MONO%: 36.5 % — AB (ref 0.0–14.0)
NEUT%: 9.5 % — ABNORMAL LOW (ref 39.0–75.0)
NEUTROS ABS: 0.3 10*3/uL — AB (ref 1.5–6.5)
Platelets: 98 10*3/uL — ABNORMAL LOW (ref 140–400)
RBC: 4.66 10*6/uL (ref 4.20–5.82)
RDW: 16.9 % — AB (ref 11.0–14.6)
WBC: 3 10*3/uL — ABNORMAL LOW (ref 4.0–10.3)
lymph#: 1.6 10*3/uL (ref 0.9–3.3)

## 2013-09-08 ENCOUNTER — Ambulatory Visit: Payer: Medicare HMO | Admitting: Pharmacist Clinician (PhC)/ Clinical Pharmacy Specialist

## 2013-09-08 ENCOUNTER — Telehealth: Payer: Self-pay | Admitting: Cardiology

## 2013-09-09 NOTE — Telephone Encounter (Signed)
Closed encounter °

## 2013-09-10 ENCOUNTER — Ambulatory Visit (INDEPENDENT_AMBULATORY_CARE_PROVIDER_SITE_OTHER): Payer: Medicare HMO | Admitting: Pharmacist

## 2013-09-10 DIAGNOSIS — I639 Cerebral infarction, unspecified: Secondary | ICD-10-CM

## 2013-09-10 DIAGNOSIS — I6340 Cerebral infarction due to embolism of unspecified cerebral artery: Secondary | ICD-10-CM

## 2013-09-10 DIAGNOSIS — Z7901 Long term (current) use of anticoagulants: Secondary | ICD-10-CM

## 2013-09-10 DIAGNOSIS — I4891 Unspecified atrial fibrillation: Secondary | ICD-10-CM

## 2013-09-10 LAB — POCT INR: INR: 1.7

## 2013-09-22 ENCOUNTER — Ambulatory Visit (INDEPENDENT_AMBULATORY_CARE_PROVIDER_SITE_OTHER): Payer: Medicare HMO | Admitting: Pharmacist Clinician (PhC)/ Clinical Pharmacy Specialist

## 2013-09-22 DIAGNOSIS — I4891 Unspecified atrial fibrillation: Secondary | ICD-10-CM

## 2013-09-22 DIAGNOSIS — Z7901 Long term (current) use of anticoagulants: Secondary | ICD-10-CM

## 2013-09-22 DIAGNOSIS — I6340 Cerebral infarction due to embolism of unspecified cerebral artery: Secondary | ICD-10-CM

## 2013-09-22 DIAGNOSIS — I639 Cerebral infarction, unspecified: Secondary | ICD-10-CM

## 2013-09-22 LAB — POCT INR: INR: 2.3

## 2013-09-23 IMAGING — CT CT HEAD W/O CM
1 of 2 series · 13 of 30 positions shown, 17 images · non-contrast
Comparison: 11/29/2011

CLINICAL DATA: Code stroke.  Aphasia and confusion.

CT HEAD WITHOUT CONTRAST
TECHNIQUE: Contiguous axial images were obtained from the base of
the skull through the vertex without contrast.

[Series 2: brain · axial · 0.47mm/px · z∈[+149,+285]mm · 13 of 32 slices shown, 17 images]
[im 3/32  brain]
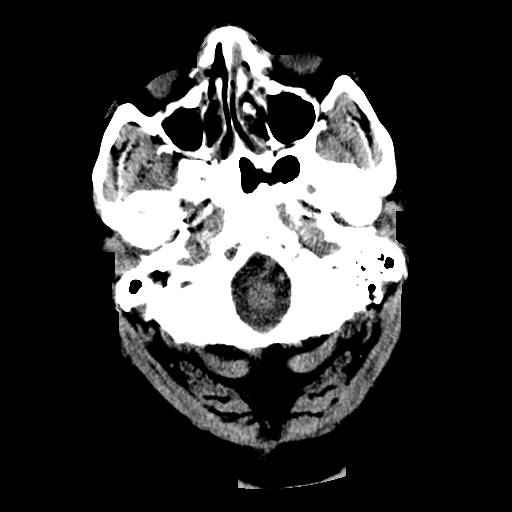
[im 3/32  bone]
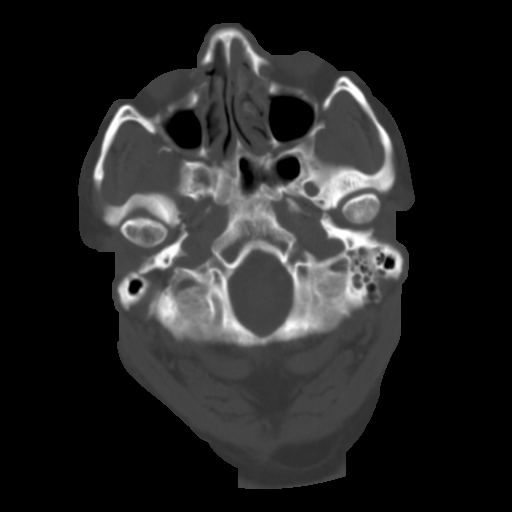
[im 5/32  brain]
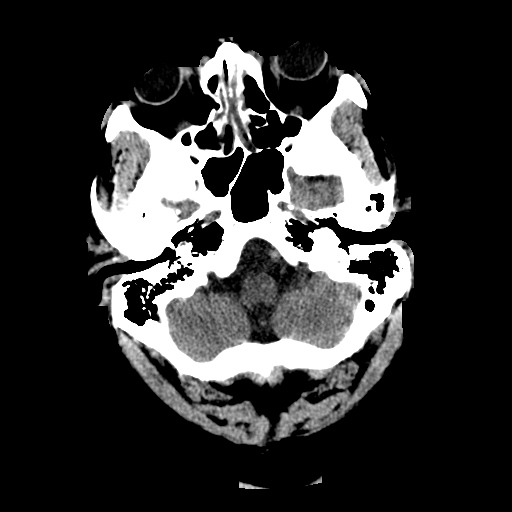
[im 7/32  brain]
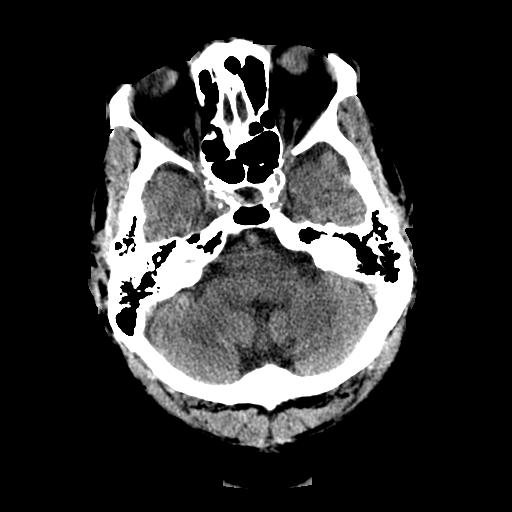
[im 9/32  brain]
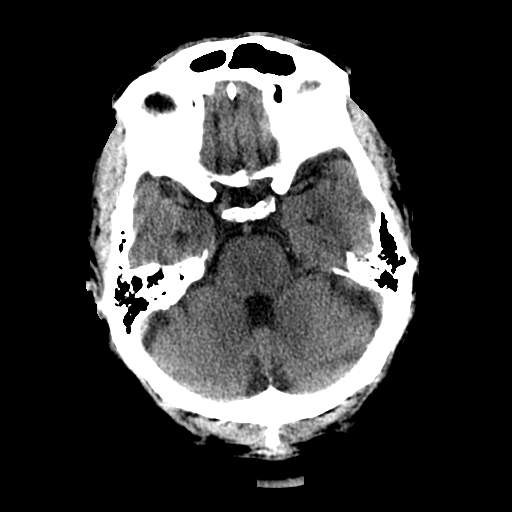
[im 12/32  brain]
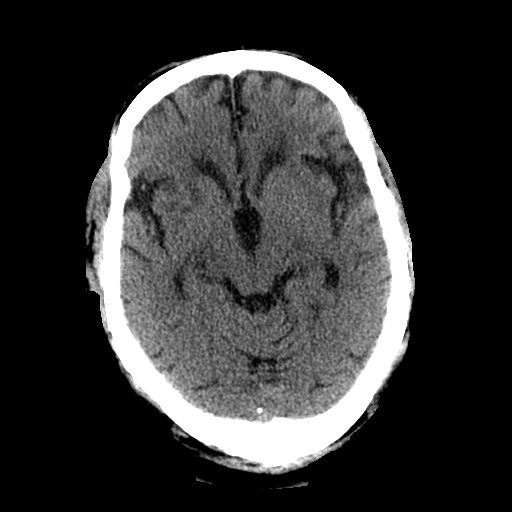
[im 12/32  bone]
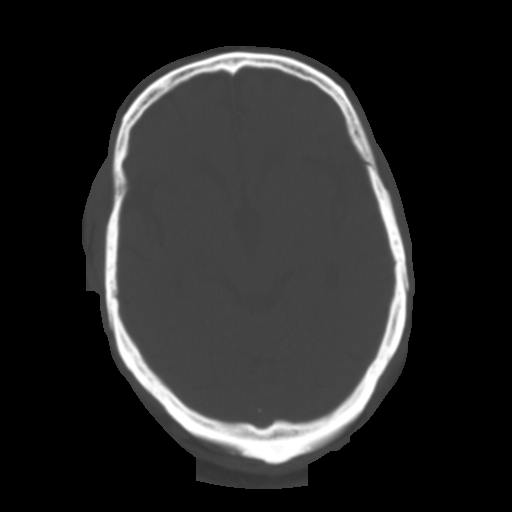
[im 14/32  brain]
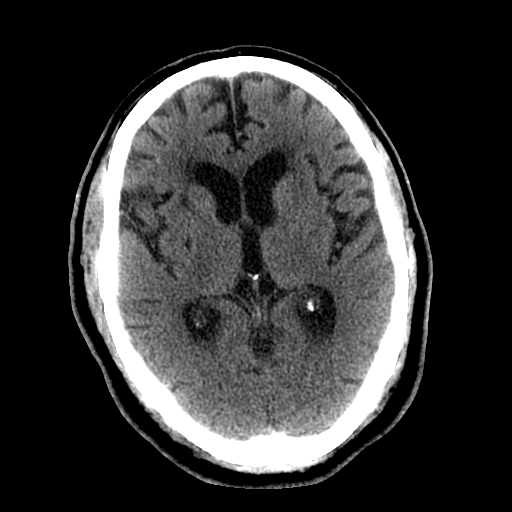
[im 16/32  brain]
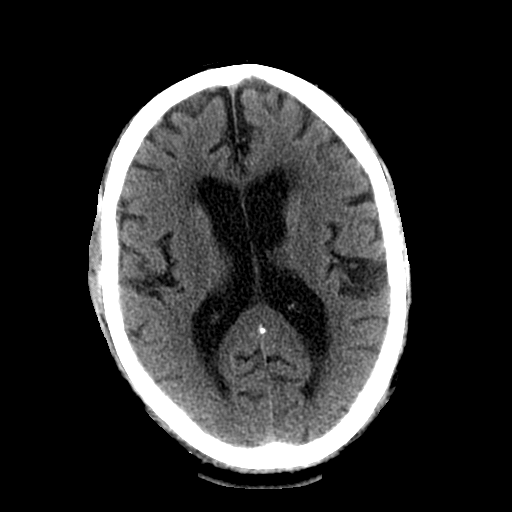
[im 18/32  brain]
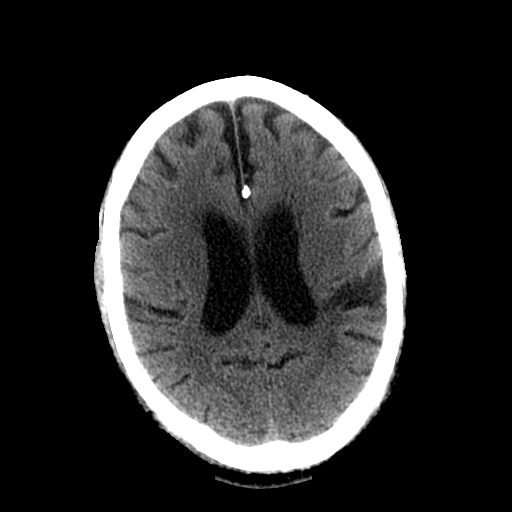
[im 20/32  brain]
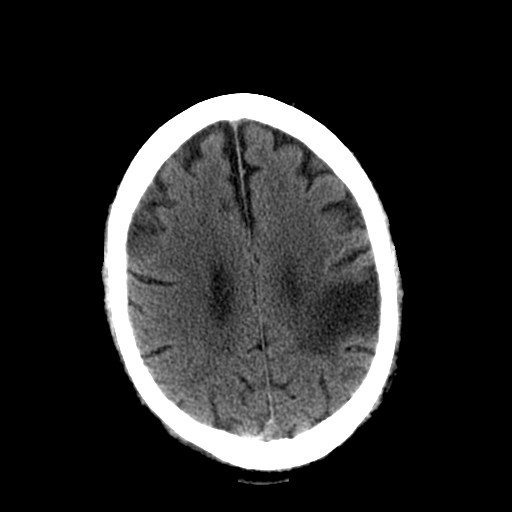
[im 20/32  bone]
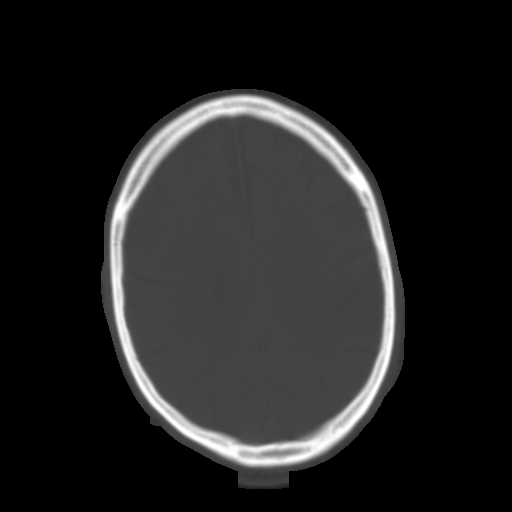
[im 23/32  brain]
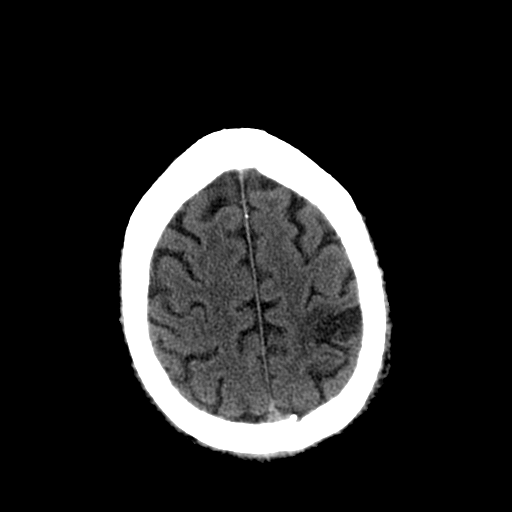
[im 25/32  brain]
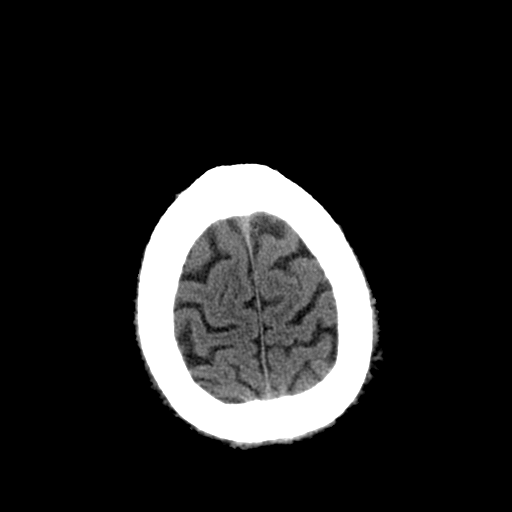
[im 27/32  brain]
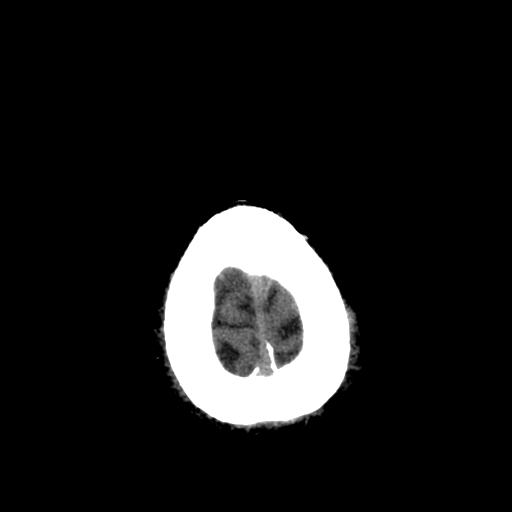
[im 29/32  brain]
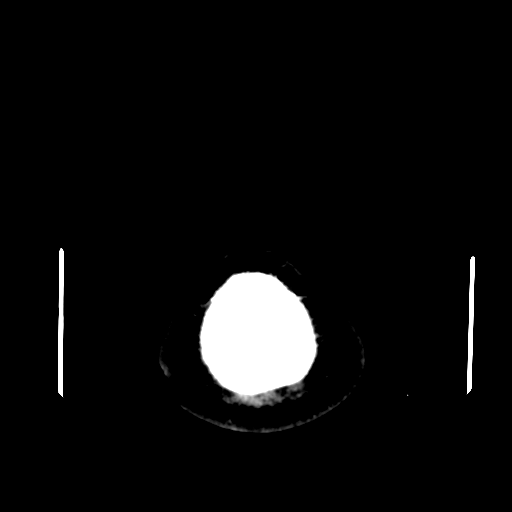
[im 29/32  bone]
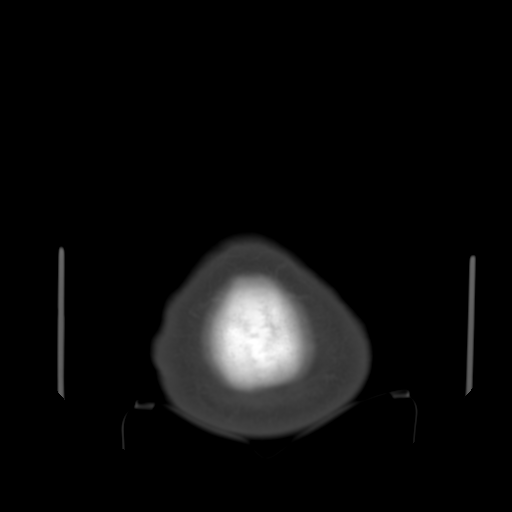

[13 of 30 positions shown; findings below may reference images not displayed]

FINDINGS: Encephalomalacia within the left parietal lobe is similar
to previous MRI.

There is prominence of the sulci and ventricles consistent with
brain atrophy.  Chronic right basal ganglia lacunar infarcts are
again identified.

There is no evidence for acute brain infarct, hemorrhage or mass.

There is evidence of old trauma to the right orbit.  Mild mucosal
thickening is identified involving the right maxillary sinus.  The
skull is intact.
IMPRESSION: 1.  No acute intracranial abnormalities.
2.  Remote left parietal lobe infarct.

## 2013-09-26 IMAGING — CR DG CHEST 1V PORT
1 series · 1 of 1 positions shown · non-contrast
Comparison: 03/16/2012

CLINICAL DATA: Respiratory failure.

PORTABLE CHEST - 1 VIEW

[AP]
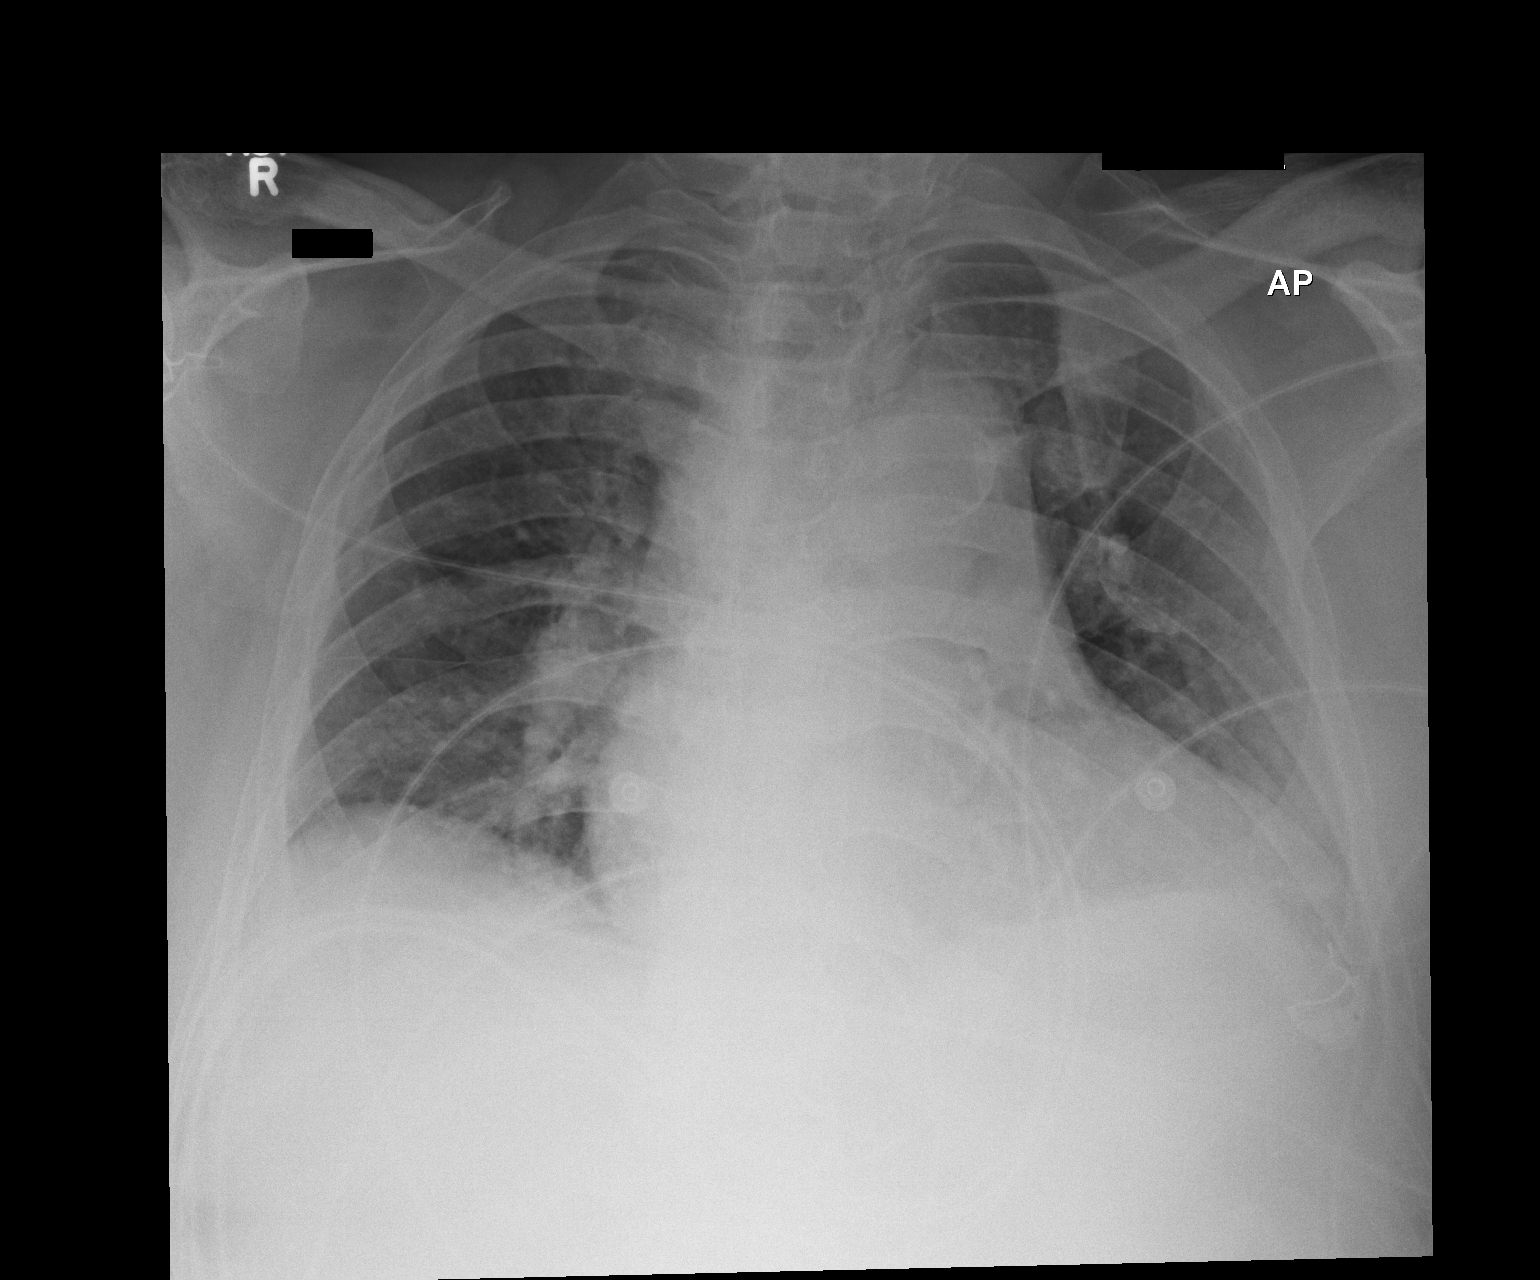

[1 of 1 positions shown; findings below may reference images not displayed]

FINDINGS: Cardiomegaly with vascular congestion.  Bibasilar
atelectasis.  No visible effusions.  No acute bony abnormality.
IMPRESSION: Slight improvement in lung volumes.  Continued cardiomegaly,
vascular congestion and bibasilar atelectasis.

## 2013-09-29 ENCOUNTER — Ambulatory Visit (INDEPENDENT_AMBULATORY_CARE_PROVIDER_SITE_OTHER): Payer: Medicare HMO | Admitting: Cardiology

## 2013-09-29 VITALS — BP 159/72 | HR 69 | Ht 65.0 in | Wt 244.7 lb

## 2013-09-29 DIAGNOSIS — Z9861 Coronary angioplasty status: Secondary | ICD-10-CM

## 2013-09-29 DIAGNOSIS — I2510 Atherosclerotic heart disease of native coronary artery without angina pectoris: Secondary | ICD-10-CM

## 2013-09-29 DIAGNOSIS — I4891 Unspecified atrial fibrillation: Secondary | ICD-10-CM

## 2013-09-29 DIAGNOSIS — E785 Hyperlipidemia, unspecified: Secondary | ICD-10-CM

## 2013-09-29 DIAGNOSIS — Z7901 Long term (current) use of anticoagulants: Secondary | ICD-10-CM

## 2013-09-29 DIAGNOSIS — I10 Essential (primary) hypertension: Secondary | ICD-10-CM

## 2013-09-29 DIAGNOSIS — I6340 Cerebral infarction due to embolism of unspecified cerebral artery: Secondary | ICD-10-CM

## 2013-09-29 DIAGNOSIS — I5189 Other ill-defined heart diseases: Secondary | ICD-10-CM

## 2013-09-29 DIAGNOSIS — I480 Paroxysmal atrial fibrillation: Secondary | ICD-10-CM

## 2013-09-29 DIAGNOSIS — I519 Heart disease, unspecified: Secondary | ICD-10-CM

## 2013-09-29 DIAGNOSIS — I639 Cerebral infarction, unspecified: Secondary | ICD-10-CM

## 2013-09-29 MED ORDER — ATENOLOL 25 MG PO TABS: 25.0000 mg | ORAL_TABLET | Freq: Every day | ORAL | Status: DC

## 2013-09-29 NOTE — Patient Instructions (Signed)
Dr Ellyn Hack would like for you to take diltiazem in the morning.  Please take  ATENOLOL 25 MG in the evening.( This is an increase)  Your physician wants you to follow-up in 6 MONTH Dr Ellyn Hack. You will receive a reminder letter in the mail two months in advance. If you don't receive a letter, please call our office to schedule the follow-up appointment.

## 2013-09-30 ENCOUNTER — Telehealth: Payer: Self-pay | Admitting: Cardiology

## 2013-09-30 ENCOUNTER — Telehealth: Payer: Self-pay | Admitting: *Deleted

## 2013-09-30 NOTE — Telephone Encounter (Signed)
[facility] is calling because there is some things that Terry Robertson stated that he didn't understand from his visit on yesterday . Please Call at [phone]  Thanks

## 2013-09-30 NOTE — Telephone Encounter (Signed)
RN spoke to [place]. SHE states she prepares patient's medications. She was not able to attend visit. The son who was the patient did not give her the information and the patient could not remember.  RN informed her Diltiazem to be given in the morning and Atenolol 25 mg in the evening. She verbalized understanding

## 2013-09-30 NOTE — Telephone Encounter (Signed)
Calling patient to r/s appointment, offered 1st available opening on 07/29 patient refused, appointment r/s to 12/29/13 at 9 am.

## 2013-10-01 ENCOUNTER — Telehealth: Payer: Self-pay | Admitting: Internal Medicine

## 2013-10-01 ENCOUNTER — Encounter: Payer: Self-pay | Admitting: Cardiology

## 2013-10-01 DIAGNOSIS — I519 Heart disease, unspecified: Secondary | ICD-10-CM | POA: Insufficient documentation

## 2013-10-01 DIAGNOSIS — I5189 Other ill-defined heart diseases: Secondary | ICD-10-CM | POA: Insufficient documentation

## 2013-10-01 NOTE — Assessment & Plan Note (Signed)
Minimal residual effect. Remains on aspirin plus warfarin

## 2013-10-01 NOTE — Assessment & Plan Note (Signed)
Continue to stress the importance of dietary medication and increasing exercise.

## 2013-10-01 NOTE — Assessment & Plan Note (Signed)
Has been stable on home medications. Labs followed by PCP. Continues taking atorvastatin with omega-3 fish oil & Vascepa caps.

## 2013-10-01 NOTE — Assessment & Plan Note (Signed)
No angina. Continue aspirin, beta blocker and statin. Also on calcium channel blocker.

## 2013-10-01 NOTE — Progress Notes (Signed)
PCP: [name], MD  Clinic Note: Chief Complaint  Patient presents with  . Follow-up    1 month follow-up saw Kerin Ransom 6/11   HPI: Terry Robertson is a 80 y.o. male with a Cardiovascular Problem List below who presents today for a roughly one-month followup from his recent recent visit with Kerin Ransom, [place] in mid June when he was seen for post hospital followup for pneumonia complicated by a parapneumonic hemothorax requiring chest tube placement by Dr. Cyndia Bent from [facility] surgery. No indication for VATS . He has a history of CAD with MI in 51 and stents in the circumflex and LAD. Last cath was in 2008 that revealed mid RCA occlusion but no other significant disease. He has remained stable from a cardiac standpoint but developed paroxysmal atrial fibrillation during her hospital visit for fatigue and malaise likely related to UTI. Unfortunately when he had his first episode of A. fib he had a stroke while in the hospital that was mostly notable for expressive aphasia.  Interval History: Since his last visit, he continues to do better every day. He is still somewhat tired and frustrated that he is not recovering as fast. He's not having used a similar coughing and dyspnea. He remains relatively stable overall from a cardiac standpoint, no and bleeding complications such as melena, hematochezia, epistaxis or hematuria with the reinstitution of his warfarin. He still does have some mild bruising. He's not had any sensation of any recurrence of A. fib although he was not overly stenotic without when he initially developed A. fib. He has a mildly unsteady gait and uses a walker or cane, but denies any loss of consciousness/syncope or near syncope, no TIA or recurrent CVA/amaurosis fugax symptoms. He denies any chest tightness or pressure with rest or exertion and no dyspnea unless he overexerts himself. No PND, orthopnea with maybe trace edema for which he takes low-dose Lasix.   Past Medical History   Diagnosis Date  . Diabetes mellitus   . Hypertension   . Stroke 08/2011, 03/2012    L MCA (Afib RVR while admitted for hypotension) - expressive aphasia (almost totally recovered)  . Seizures     Post CVA; on Keppra  . OSA on CPAP   . Paroxysmal atrial fibrillation     on coumadin; Diagnosed at the time of his 1st CVA  . Obesity, morbid     BMI  . Heart attack 1997    inferior MI with left circumflex stent  . CAD S/P percutaneous coronary angioplasty 1997    status post remote PCI to the LAD and circumflex in 1997, heart cath in 2008 showed widely patent stents in the circumflex and LAD.  RCA had a mid lesion and then was totally occluded distally.  . Myelodysplastic syndrome     w/mild anemia an neutropenia  . BPH (benign prostatic hyperplasia)     TURP in 12/2010  . CHF (congestive heart failure)   . Asbestosis     family unclear where he was evaluated in the past    Prior Cardiac Evaluation and Past Surgical History: Past Surgical History  Procedure Laterality Date  . Transthoracic echocardiogram  08/06/11    EF 55% to 65% with grade2 diastolic dysfunction/pseudonormal with mildly dilated left atrium, had evidence of elevated filling ressures in the left vientricle and left atrium  . Cardiac catheterization  02/08/07    widely patent stent in the circumflex and LAD.  RCA had a mid lesion and then was  totally occluded distally. Cook bare-metal 3.5x20 mm stent  . Transurethral resection of prostate  12/2010  . Nm myoview ltd  01/09/1999    Inferior wall thinning with possible mild ischemia versus infarct. This would correlate well with his known RCA occlusion    MEDICATIONS AND ALLERGIES REVIEWED IN EPIC No Change in Social and Family History  ROS: A comprehensive Review of Systems was performed.  He was recently admitted (May 2015) for a PNA - seems to be recovering, but still a bit weak.  Review of Systems  Constitutional: Negative.        Just not as much energy as he used  to have.  HENT: Negative.   Respiratory: Positive for shortness of breath. Negative for cough, hemoptysis, sputum production and wheezing.        Mild exertional dyspnea - at baseline,no change  Cardiovascular: Negative for chest pain, palpitations, orthopnea, claudication, leg swelling and PND.  Gastrointestinal: Negative.  Negative for blood in stool.  Skin: Negative.   Neurological: Negative for dizziness, sensory change, speech change, seizures and loss of consciousness.       Only very minimal residual aphasia  Endo/Heme/Allergies: Negative for polydipsia. Bruises/bleeds easily.  Psychiatric/Behavioral: Negative for depression. The patient is not nervous/anxious.   All other systems reviewed and are negative.  Wt Readings from Last 3 Encounters:  09/29/13 244 lb 11.2 oz (110.995 kg)  08/14/13 241 lb 8 oz (109.544 kg)  08/06/13 239 lb 6.4 oz (108.591 kg)   PHYSICAL EXAM BP 159/72  Pulse 69  Ht 5\' 5"  (1.651 m)  Wt 244 lb 11.2 oz (110.995 kg)  BMI 40.72 kg/m2 General: Pleasant gentleman. Appears younger than his stated age. He is in no acute distress. He is alert and oriented x3. He answers questions appropriately but is a bit slow with answering some questions.Marland Kitchen He is well groomed and well nourished. H  HEENT: NCAT. EOMI. MMM. Speech is not so slurred, but remains a bit slow. Anicteric sclerae.  Neck: Supple with no LAN, JVD, or carotid bruit.  Lungs: CTAB. Nonlabored. Normal effort. Good air movement.  Heart: RRR, Distant S1, S2. Occasional ectopy but soft SEM heard at the right upper sternal border.  Abdomen: Soft/NT/ND/NABS. No HSM. Difficult to palpate due to obesity.  Extremities: No C/C/E. Pedal pulses are 1+ bilaterally and upper extremities are 2+ bilaterally.    Adult ECG Report - not performed  Recent Labs 09/04/2013: CBC relatively normal, reviewed in Epic. Low white blood cell count of 3.0.  ASSESSMENT / PLAN:   CAD PCI to LAD/CFX in 1997. Cath '08- medical  Rx No angina. Continue aspirin, beta blocker and statin. Also on calcium channel blocker.  PAF (paroxysmal atrial fibrillation) Normal sinus rhythm on exam. On the atenolol and diltiazem for rate control. At this time no indication for rhythm control.  CHA2DS2VSc - 7, back on warfarin for anticoagulation without bleeding complication  History of likely cardioembolic stroke (10/07/4194) with onset of new diagnosis A. fib; expressive aphasia Minimal residual effect. Remains on aspirin plus warfarin  Left ventricular diastolic dysfunction, NYHA class 2 (grade 2 dysfunction with elevated filling pressures) Mild lower extremity edema. He is on low dose of Lasix which I instructed him to use a sliding scale basis. He is currently up a few pounds also recommended that he maybe take a couple extra doses the next few days to reduce his weight. He could just have gained the weight back that he lost while in the hospital, so would  not be overly aggressive. He's not overly active, therefore be difficult to see how symptomatic he truly is. No PND or orthopnea however.  Essential hypertension He is a bit hypertensive today. Will increase the atenolol dose to a full 25 mg and switch to the evening. Take diltiazem in the morning. If his pressure continues to be elevated, would not have additional room to increase rate control agents, would probably add ARB or ACE inhibitor.  Dyslipidemia, goal LDL below 70 Has been stable on home medications. Labs followed by PCP. Continues taking atorvastatin with omega-3 fish oil & Vascepa caps.  Severe obesity (BMI >= 40) Continue to stress the importance of dietary medication and increasing exercise.   No orders of the defined types were placed in this encounter.   Meds ordered this encounter  Medications  . atenolol (TENORMIN) 25 MG tablet    Sig: Take 1 tablet (25 mg total) by mouth daily.    Dispense:  30 tablet    Refill:  6    Followup: 6  months   Barbaraann Avans W, M.D., M.S. Interventional Cardiologist   Pager # [phone]

## 2013-10-01 NOTE — Assessment & Plan Note (Signed)
Normal sinus rhythm on exam. On the atenolol and diltiazem for rate control. At this time no indication for rhythm control.  CHA2DS2VSc - 7, back on warfarin for anticoagulation without bleeding complication

## 2013-10-01 NOTE — Assessment & Plan Note (Addendum)
Mild lower extremity edema. He is on low dose of Lasix which I instructed him to use a sliding scale basis. He is currently up a few pounds also recommended that he maybe take a couple extra doses the next few days to reduce his weight. He could just have gained the weight back that he lost while in the hospital, so would not be overly aggressive. He's not overly active, therefore be difficult to see how symptomatic he truly is. No PND or orthopnea however.

## 2013-10-01 NOTE — Assessment & Plan Note (Signed)
He is a bit hypertensive today. Will increase the atenolol dose to a full 25 mg and switch to the evening. Take diltiazem in the morning. If his pressure continues to be elevated, would not have additional room to increase rate control agents, would probably add ARB or ACE inhibitor.

## 2013-10-01 NOTE — Telephone Encounter (Signed)
, °

## 2013-10-03 ENCOUNTER — Other Ambulatory Visit (HOSPITAL_BASED_OUTPATIENT_CLINIC_OR_DEPARTMENT_OTHER): Payer: Medicare HMO

## 2013-10-03 ENCOUNTER — Telehealth: Payer: Self-pay | Admitting: Internal Medicine

## 2013-10-03 ENCOUNTER — Ambulatory Visit (HOSPITAL_BASED_OUTPATIENT_CLINIC_OR_DEPARTMENT_OTHER): Payer: Medicare HMO | Admitting: Internal Medicine

## 2013-10-03 VITALS — BP 173/67 | HR 65 | Temp 98.3°F | Resp 19 | Ht 65.0 in | Wt 248.3 lb

## 2013-10-03 DIAGNOSIS — I4891 Unspecified atrial fibrillation: Secondary | ICD-10-CM

## 2013-10-03 DIAGNOSIS — D469 Myelodysplastic syndrome, unspecified: Secondary | ICD-10-CM

## 2013-10-03 DIAGNOSIS — D759 Disease of blood and blood-forming organs, unspecified: Secondary | ICD-10-CM

## 2013-10-03 LAB — CBC WITH DIFFERENTIAL/PLATELET
BASO%: 1.2 % (ref 0.0–2.0)
BASOS ABS: 0 10*3/uL (ref 0.0–0.1)
EOS ABS: 0 10*3/uL (ref 0.0–0.5)
EOS%: 0.4 % (ref 0.0–7.0)
HCT: 36 % — ABNORMAL LOW (ref 38.4–49.9)
HEMOGLOBIN: 11.2 g/dL — AB (ref 13.0–17.1)
LYMPH%: 52.3 % — AB (ref 14.0–49.0)
MCH: 25.2 pg — AB (ref 27.2–33.4)
MCHC: 31 g/dL — ABNORMAL LOW (ref 32.0–36.0)
MCV: 81.2 fL (ref 79.3–98.0)
MONO#: 1.2 10*3/uL — ABNORMAL HIGH (ref 0.1–0.9)
MONO%: 36.4 % — ABNORMAL HIGH (ref 0.0–14.0)
NEUT%: 9.7 % — AB (ref 39.0–75.0)
NEUTROS ABS: 0.3 10*3/uL — AB (ref 1.5–6.5)
PLATELETS: 85 10*3/uL — AB (ref 140–400)
RBC: 4.44 10*6/uL (ref 4.20–5.82)
RDW: 16.7 % — AB (ref 11.0–14.6)
WBC: 3.3 10*3/uL — ABNORMAL LOW (ref 4.0–10.3)
lymph#: 1.7 10*3/uL (ref 0.9–3.3)

## 2013-10-03 LAB — TECHNOLOGIST REVIEW

## 2013-10-03 LAB — COMPREHENSIVE METABOLIC PANEL (CC13)
ALK PHOS: 66 U/L (ref 40–150)
ALT: 12 U/L (ref 0–55)
AST: 22 U/L (ref 5–34)
Albumin: 3.6 g/dL (ref 3.5–5.0)
Anion Gap: 7 mEq/L (ref 3–11)
BUN: 8.5 mg/dL (ref 7.0–26.0)
CALCIUM: 8.7 mg/dL (ref 8.4–10.4)
CO2: 24 mEq/L (ref 22–29)
CREATININE: 0.9 mg/dL (ref 0.7–1.3)
Chloride: 107 mEq/L (ref 98–109)
Glucose: 93 mg/dl (ref 70–140)
POTASSIUM: 3.8 meq/L (ref 3.5–5.1)
Sodium: 138 mEq/L (ref 136–145)
Total Bilirubin: 0.41 mg/dL (ref 0.20–1.20)
Total Protein: 9.4 g/dL — ABNORMAL HIGH (ref 6.4–8.3)

## 2013-10-03 LAB — LACTATE DEHYDROGENASE (CC13): LDH: 280 U/L — ABNORMAL HIGH (ref 125–245)

## 2013-10-03 NOTE — Telephone Encounter (Signed)
gv and printed appt sched and avs for pt for OCT. °

## 2013-10-03 NOTE — Progress Notes (Signed)
[facility] OFFICE PROGRESS NOTE  Terry Greenland, MD [address] [place] [address]  DIAGNOSIS: Myelodysplastic syndrome - Plan: CBC with Differential, Comprehensive metabolic panel (Cmet) - [facility], Lactate dehydrogenase (LDH) - [facility]  Chief Complaint  Patient presents with  . Follow-up    CURRENT TREATMENT: Observation.   INTERVAL HISTORY: Terry Robertson 80 y.o. male with a history of MDS is here for follow-up visit.  He was last seen by me on 08/06/2013.  He is a gentleman who has been following since October 2009, with an isolated neutropenia. Bone marrow biopsy showed an element of both myeloproliferative as well as myelodysplastic syndrome, but overall I felt that the features are consistent with myelodysplastic syndrome. I have elected to treatment him with supportive care only for the time being. He has been completely asymptomatic. He has not reported any recurrent sign of pulmonary infection. He is not having increased blast. His cytogenetics are normal. He denies any recent emergency room visits or hospitalizations.  He has received one Neulasta injection in the past and has helped his blood count dramatically and it is good to know that he has a good response to it down the line.  He has not reported any peripheral neuropathy. He has not reported any other complaints. He was admitted to [facility] back in April (04/18-04/25) for acute hypoxic respiratory failure, left hemothorax and pneumonia complicated by hemorrhagic transformation secondary to coumadin.  He is back on his coumadin.  He complains of MSK chest pain occasionally.  He denies any bleeding complications.   MEDICAL HISTORY: Past Medical History  Diagnosis Date  . Diabetes mellitus   . Hypertension   . Stroke 08/2011, 03/2012    L MCA (Afib RVR while admitted for hypotension) - expressive aphasia (almost totally recovered)  . Seizures     Post CVA; on Keppra  . OSA on CPAP   .  Paroxysmal atrial fibrillation     on coumadin; Diagnosed at the time of his 1st CVA  . Obesity, morbid     BMI  . Heart attack 1997    inferior MI with left circumflex stent  . CAD S/P percutaneous coronary angioplasty 1997    status post remote PCI to the LAD and circumflex in 1997, heart cath in 2008 showed widely patent stents in the circumflex and LAD.  RCA had a mid lesion and then was totally occluded distally.  . Myelodysplastic syndrome     w/mild anemia an neutropenia  . BPH (benign prostatic hyperplasia)     TURP in 12/2010  . CHF (congestive heart failure)   . Asbestosis     family unclear where he was evaluated in the past    INTERIM HISTORY: has CAD PCI to LAD/CFX in 1997. Cath '08- medical Rx; Morbid obesity; Myelodysplastic syndrome; Dyslipidemia, goal LDL below 70; Essential hypertension; S/P TURP Oct 2012; DM type 2 (diabetes mellitus, type 2); Diastolic dysfunction: Grade 2; Thrombocytopenia, chronic; History of likely cardioembolic stroke (06/10/6544) with onset of new diagnosis A. fib; expressive aphasia; Acute respiratory failure; Altered mental status, suspect post ictal; Seizure, resolved; Fever, unclear etiology; Pulmonary edema; CVA (cerebral infarction); Long term (current) use of anticoagulants; Other convulsions; Severe obesity (BMI >= 40); PAF (paroxysmal atrial fibrillation); Seizure disorder; CAP (community acquired pneumonia); Pneumonia; Hemothorax, left; and Left ventricular diastolic dysfunction, NYHA class 2 (grade 2 dysfunction with elevated filling pressures) on his problem list.    ALLERGIES:  has No Known Allergies.  MEDICATIONS:  has a current medication list which includes the following prescription(s): albuterol, aspirin ec, atenolol, atorvastatin, bisacodyl, diltiazem, fish oil-omega-3 fatty acids, furosemide, hydrocodone-acetaminophen, icosapent ethyl, levetiracetam, vitamin d (ergocalciferol), and warfarin.  SURGICAL HISTORY:  Past Surgical History   Procedure Laterality Date  . Transthoracic echocardiogram  08/06/11    EF 55% to 65% with grade2 diastolic dysfunction/pseudonormal with mildly dilated left atrium, had evidence of elevated filling ressures in the left vientricle and left atrium  . Cardiac catheterization  02/08/07    widely patent stent in the circumflex and LAD.  RCA had a mid lesion and then was totally occluded distally. Cook bare-metal 3.5x20 mm stent  . Transurethral resection of prostate  12/2010  . Nm myoview ltd  01/09/1999    Inferior wall thinning with possible mild ischemia versus infarct. This would correlate well with his known RCA occlusion    REVIEW OF SYSTEMS:   Constitutional: Denies fevers, chills or abnormal weight loss Eyes: Denies blurriness of vision Ears, nose, mouth, throat, and face: Denies mucositis or sore throat Respiratory: Denies cough, dyspnea or wheezes Cardiovascular: Denies palpitation, chest discomfort or lower extremity swelling Gastrointestinal:  Denies nausea, heartburn or change in bowel habits Skin: Denies abnormal skin rashes Lymphatics: Denies new lymphadenopathy or easy bruising Neurological:Denies numbness, tingling or new weaknesses Behavioral/Psych: Mood is stable, no new changes  All other systems were reviewed with the patient and are negative.  PHYSICAL EXAMINATION: ECOG PERFORMANCE STATUS: 1 - Symptomatic but completely ambulatory  Blood pressure 173/67, pulse 65, temperature 98.3 F (36.8 C), temperature source Oral, resp. rate 19, height '5\' 5"'  (1.651 m), weight 248 lb 4.8 oz (112.628 kg).  GENERAL:alert, no distress and comfortable; ambulates with a cane SKIN: skin color, texture, turgor are normal, no rashes or significant lesions EYES: normal, Conjunctiva are pink and non-injected, sclera clear OROPHARYNX:no exudate, no erythema and lips, buccal mucosa, and tongue normal  NECK: supple, thyroid normal size, non-tender, without nodularity LYMPH:  no palpable  lymphadenopathy in the cervical, axillary or supraclavicular LUNGS: clear to auscultation with normal breathing effort, no wheezes or rhonchi HEART: regular rate & rhythm and no murmurs and no lower extremity edema ABDOMEN:abdomen soft, non-tender and normal bowel sounds Musculoskeletal:no cyanosis of digits and no clubbing  NEURO: alert & oriented x 3 with fluent speech, no focal motor/sensory deficits  Labs:  Lab Results  Component Value Date   WBC 3.3* 10/03/2013   HGB 11.2* 10/03/2013   HCT 36.0* 10/03/2013   MCV 81.2 10/03/2013   PLT 85* 10/03/2013   NEUTROABS 0.3* 10/03/2013      Chemistry      Component Value Date/Time   NA 138 10/03/2013 1235   NA 141 07/15/2013 1208   K 3.8 10/03/2013 1235   K 3.8 07/15/2013 1208   CL 104 07/15/2013 1208   CL 109* 01/02/2012 1526   CO2 24 10/03/2013 1235   CO2 24 07/15/2013 1208   BUN 8.5 10/03/2013 1235   BUN 10 07/15/2013 1208   CREATININE 0.9 10/03/2013 1235   CREATININE 0.88 07/15/2013 1208      Component Value Date/Time   CALCIUM 8.7 10/03/2013 1235   CALCIUM 8.6 07/15/2013 1208   ALKPHOS 66 10/03/2013 1235   ALKPHOS 56 07/15/2013 1208   AST 22 10/03/2013 1235   AST 19 07/15/2013 1208   ALT 12 10/03/2013 1235   ALT 18 07/15/2013 1208   BILITOT 0.41 10/03/2013 1235   BILITOT 0.4 07/15/2013 1208       GFR Estimated  Creatinine Clearance: 73.3 ml/min (by C-G formula based on Cr of 0.9). Coagulation profile No results found for this basename: INR, PROTIME,  in the last 168 hours  Studies:  No results found.   RADIOGRAPHIC STUDIES: No results found.  ASSESSMENT: Terry Robertson 80 y.o. male with a history of Myelodysplastic syndrome - Plan: CBC with Differential, Comprehensive metabolic panel (Cmet) - [facility], Lactate dehydrogenase (LDH) - [facility]   PLAN:   1. Myelodysplastic/myeloproliferative disorder, JAK2 mutation is negative.  --His cytogenetics normal, but for the time being it is more likely myelodysplastic syndrome more than anything  else. We will continue supportive measures at this time. He does not need any growth factor support unless he has a symptomatology such as recurrent infection etc. We will continue to follow him every couple of months. If he has any additional bacterial infections, we will start G-CSF to support his WBCs.    2. Cytopenias. --No need for any transfusions or growth factor support at this time. As noted in #1. His hemoglobin is 11.2, plts 85.   3. Atrial Fibrillation. --He was admitted to [facility] back in April (04/18 -04/25) for acute hypoxic respiratory failure, left hemothorax and pneumonia complicated by hemorrhagic transformation.  4. Follow-up. --He will follow up in 3 months for symptom visit and repeat labs including a CBC and chemistries and LDH.   All questions were answered. The patient knows to call the clinic with any problems, questions or concerns. We can certainly see the patient much sooner if necessary.  I spent 15 minutes counseling the patient face to face. The total time spent in the appointment was 25 minutes.    Jaycee Mckellips, MD 10/03/2013 3:38 PM

## 2013-10-06 ENCOUNTER — Ambulatory Visit: Payer: Medicare HMO

## 2013-10-06 ENCOUNTER — Other Ambulatory Visit: Payer: Medicare HMO

## 2013-10-13 ENCOUNTER — Ambulatory Visit: Payer: Medicare HMO | Admitting: Nurse Practitioner

## 2013-10-20 ENCOUNTER — Ambulatory Visit (INDEPENDENT_AMBULATORY_CARE_PROVIDER_SITE_OTHER): Payer: Medicare HMO | Admitting: Pharmacist Clinician (PhC)/ Clinical Pharmacy Specialist

## 2013-10-20 DIAGNOSIS — I639 Cerebral infarction, unspecified: Secondary | ICD-10-CM

## 2013-10-20 DIAGNOSIS — I6340 Cerebral infarction due to embolism of unspecified cerebral artery: Secondary | ICD-10-CM

## 2013-10-20 DIAGNOSIS — I4891 Unspecified atrial fibrillation: Secondary | ICD-10-CM

## 2013-10-20 DIAGNOSIS — Z7901 Long term (current) use of anticoagulants: Secondary | ICD-10-CM

## 2013-10-20 LAB — POCT INR: INR: 2.1

## 2013-11-13 ENCOUNTER — Telehealth: Payer: Self-pay | Admitting: Cardiology

## 2013-11-13 MED ORDER — WARFARIN SODIUM 5 MG PO TABS: 5.0000 mg | ORAL_TABLET | Freq: Every day | ORAL | Status: DC

## 2013-11-13 NOTE — Telephone Encounter (Signed)
Lanelle Bal called in stating that Mr. Novinger needs an updated prescription for warfarin. Please call  Thanks

## 2013-11-13 NOTE — Telephone Encounter (Signed)
Rx refill sent to patient pharmacy   

## 2013-11-17 ENCOUNTER — Ambulatory Visit (INDEPENDENT_AMBULATORY_CARE_PROVIDER_SITE_OTHER): Payer: Medicare HMO | Admitting: Pharmacist Clinician (PhC)/ Clinical Pharmacy Specialist

## 2013-11-17 DIAGNOSIS — I4891 Unspecified atrial fibrillation: Secondary | ICD-10-CM

## 2013-11-17 DIAGNOSIS — Z7901 Long term (current) use of anticoagulants: Secondary | ICD-10-CM

## 2013-11-17 DIAGNOSIS — I6340 Cerebral infarction due to embolism of unspecified cerebral artery: Secondary | ICD-10-CM

## 2013-11-17 DIAGNOSIS — I639 Cerebral infarction, unspecified: Secondary | ICD-10-CM

## 2013-11-17 LAB — POCT INR: INR: 2

## 2013-12-01 ENCOUNTER — Ambulatory Visit (INDEPENDENT_AMBULATORY_CARE_PROVIDER_SITE_OTHER): Payer: Medicare HMO | Admitting: Podiatry

## 2013-12-01 DIAGNOSIS — M79609 Pain in unspecified limb: Secondary | ICD-10-CM

## 2013-12-01 DIAGNOSIS — B351 Tinea unguium: Secondary | ICD-10-CM

## 2013-12-01 DIAGNOSIS — M79676 Pain in unspecified toe(s): Secondary | ICD-10-CM

## 2013-12-01 NOTE — Patient Instructions (Signed)
Plaque topical antibiotic ointment daily to the third left toe, cover with a Band-Aid until a scab forms

## 2013-12-02 NOTE — Progress Notes (Signed)
Subjective: This patient presents complaining of painful toenails  Objective: The toenails are hypertrophic, elongated, discolored 6-10 Middle dry scaling plantar bilaterally  Assessment: Symptomatic onychomycosis bilaterally Improving tinea pedis bilaterally  Plan: Nails x10 are debrided without a bleeding Continue over-the-counter Lotrimin cream to the plantar skin bilaterally x 30 days  Reappoint x3 months

## 2013-12-02 NOTE — Progress Notes (Deleted)
Subjective:     Patient ID: Terry Robertson, male   DOB: 10/22/1928, 80 y.o.   MRN: [id number]  HPI   Review of Systems     Objective:   Physical Exam     Assessment:     ***    Plan:     ***

## 2013-12-05 ENCOUNTER — Encounter: Payer: Self-pay | Admitting: Cardiology

## 2013-12-15 ENCOUNTER — Ambulatory Visit (INDEPENDENT_AMBULATORY_CARE_PROVIDER_SITE_OTHER): Payer: Medicare HMO | Admitting: Pharmacist Clinician (PhC)/ Clinical Pharmacy Specialist

## 2013-12-15 DIAGNOSIS — I4891 Unspecified atrial fibrillation: Secondary | ICD-10-CM

## 2013-12-15 DIAGNOSIS — I639 Cerebral infarction, unspecified: Secondary | ICD-10-CM

## 2013-12-15 DIAGNOSIS — Z7901 Long term (current) use of anticoagulants: Secondary | ICD-10-CM

## 2013-12-15 LAB — POCT INR: INR: 2.1

## 2013-12-25 ENCOUNTER — Telehealth: Payer: Self-pay

## 2013-12-25 MED ORDER — WARFARIN SODIUM 5 MG PO TABS
5.0000 mg | ORAL_TABLET | Freq: Every day | ORAL | Status: DC
Start: 2013-12-25 — End: 2014-08-14

## 2013-12-25 NOTE — Telephone Encounter (Signed)
Rx refill sent to pharmacy as requested. 

## 2013-12-29 ENCOUNTER — Ambulatory Visit: Payer: Self-pay | Admitting: Nurse Practitioner

## 2013-12-31 ENCOUNTER — Other Ambulatory Visit: Payer: Self-pay | Admitting: Physician Assistant

## 2013-12-31 DIAGNOSIS — D469 Myelodysplastic syndrome, unspecified: Secondary | ICD-10-CM

## 2014-01-01 ENCOUNTER — Ambulatory Visit: Payer: Medicare HMO | Admitting: Physician Assistant

## 2014-01-01 ENCOUNTER — Other Ambulatory Visit: Payer: Medicare HMO

## 2014-01-08 ENCOUNTER — Ambulatory Visit (INDEPENDENT_AMBULATORY_CARE_PROVIDER_SITE_OTHER): Payer: Medicare HMO | Admitting: Adult Health

## 2014-01-08 ENCOUNTER — Encounter: Payer: Self-pay | Admitting: Adult Health

## 2014-01-08 ENCOUNTER — Ambulatory Visit: Payer: Medicare HMO | Admitting: Adult Health

## 2014-01-08 VITALS — BP 168/75 | HR 83 | Ht 64.0 in | Wt 251.0 lb

## 2014-01-08 DIAGNOSIS — Z8673 Personal history of transient ischemic attack (TIA), and cerebral infarction without residual deficits: Secondary | ICD-10-CM

## 2014-01-08 DIAGNOSIS — R569 Unspecified convulsions: Secondary | ICD-10-CM

## 2014-01-08 MED ORDER — LEVETIRACETAM 500 MG PO TABS: 500.0000 mg | ORAL_TABLET | Freq: Two times a day (BID) | ORAL | Status: DC

## 2014-01-08 NOTE — Progress Notes (Signed)
PATIENT: Terry Robertson DOB: 1929/01/24  REASON FOR VISIT: follow up HISTORY FROM: patient  HISTORY OF PRESENT ILLNESS: [name] is an 80 year old male with a history of hemorrhagic stroke. He returns today for follow-up. He is overall doing well. No further stroke symptoms. Denies any new numbness or weakness. Denies any changes with his gait or balance. He ambulates with a cane. Denies recent falls. He does have a history of seizures. He is taking Keppra 500 mg BID. He denies any recent seizure events. He is on coumadin for atrial fibrillation. Patient sees his PCP regularly - he states that they are managing his BP and cholesterol. Patient does not exercise.    HISTORY 02/19/13 (CM): 80 year old black male returns for followup history of hemorrhagic infarct extending from the posterior left region to the posterior left frontal parietal lobe this occurred in June of 2013. He has not had further stroke symptoms. He also has a history of seizures and is currently on Keppra 500 twice daily without recurrent seizure events. He does not exercise. He ambulates with a cane, no falls  since last seen. He is on chronic Coumadin therapy with minimal bruising. He is on Coumadin for atrial fibrillation.  HISTORY: He has a history of stroke and seizure . He was admitted to the hospital 08/05/11 for 2 day history of weakness, night sweats and CP. On admission he was hypotensive 67/40 labs showed elevated BUN and creatinine. 2 D echo showed EF 55-65% with normal wall motion and grade 2 diastolic dysfunction. On 08/07/2011 patient converted to A fib with RVR patient was given heparin bolus followed by heparin drip and diltiazem. 08/09/11 patient was noted to be more confused with speech difficulty. CT head obtained demonstrating 3 cm acute non hemorrhagic left parietal MCA territory infarction without hemorrhage or significant mass effect. He was initially place on IV heparin, this was changed to PO coumadin with asa  bridge  MRI confirmed acute moderate sized non hemorrhagic infarct extends from the posterior left opercular region into the posterior left frontal - parietal lobe.  US carotid, No significant extracranial carotid artery stenosis demonstrated. Vertebrals are patent with antegrade flow.  He was noted on 9/23 to be driving erratically, went home and EMS was notified and found him sitting in a chair unable to speak but awake with right sided gaze, right arm twitching. In ED upon arrival, was noted to have seizure. Further decompensated and required intubation and ICU admit.  Repeat MRI brain Remote left parietal lobe infarct. On diffusion sequence without evidence of an acute infarct.  EEG normal diffusely slow without any evidence of seizure  He is taking keppra 500mg  bid, tolerating it well, no recurrent seizure, mild memory loss, ambulate with cane. Does not exercise. No further stroke activity  REVIEW OF SYSTEMS: Full 14 system review of systems performed and notable only for:  Constitutional: N/A  Eyes: N/A Ear/Nose/Throat: N/A  Skin: N/A  Cardiovascular: N/A  Respiratory: N/A  Gastrointestinal: N/A  Genitourinary: N/A Hematology/Lymphatic: N/A  Endocrine: N/A Musculoskeletal:N/A  Allergy/Immunology: N/A  Neurological: N/A Psychiatric: N/A Sleep: N/A   ALLERGIES: No Known Allergies  HOME MEDICATIONS: Outpatient Prescriptions Prior to Visit  Medication Sig Dispense Refill  . albuterol (PROVENTIL HFA;VENTOLIN HFA) 108 (90 BASE) MCG/ACT inhaler Inhale 2 puffs into the lungs every 6 (six) hours as needed for wheezing or shortness of breath. 1 Inhaler 2  . aspirin EC 81 MG tablet Take 81 mg by mouth daily.    Marland Kitchen  atenolol (TENORMIN) 25 MG tablet Take 1 tablet (25 mg total) by mouth daily. 30 tablet 6  . atorvastatin (LIPITOR) 20 MG tablet Take 20 mg by mouth daily.    . bisacodyl (DULCOLAX) 5 MG EC tablet Take 5 mg by mouth daily as needed for moderate constipation.    Marland Kitchen diltiazem (DILACOR  XR) 180 MG 24 hr capsule Take 180 mg by mouth daily.    . fish oil-omega-3 fatty acids 1000 MG capsule Take 1 g by mouth daily.    . furosemide (LASIX) 20 MG tablet Take 1 tablet (20 mg total) by mouth daily with breakfast. 30 tablet 1  . gabapentin (NEURONTIN) 100 MG capsule Take 100 mg by mouth daily.    Marland Kitchen HYDROcodone-acetaminophen (NORCO/VICODIN) 5-325 MG per tablet Take 1 tablet by mouth every 4 (four) hours as needed for moderate pain.    Vanessa Kick Ethyl (VASCEPA) 1 G CAPS Take 1 g by mouth daily.    Marland Kitchen levETIRAcetam (KEPPRA) 500 MG tablet Take 1 tablet (500 mg total) by mouth every 12 (twelve) hours. 60 tablet 6  . Vitamin D, Ergocalciferol, (DRISDOL) 50000 UNITS CAPS Take 50,000 Units by mouth 2 (two) times a week. Takes 1 capsule on Wednesday and Saturday every week    . warfarin (COUMADIN) 5 MG tablet Take 1 tablet (5 mg total) by mouth daily. 135 tablet 1   No facility-administered medications prior to visit.    PAST MEDICAL HISTORY: Past Medical History  Diagnosis Date  . Diabetes mellitus   . Hypertension   . Stroke 08/2011, 03/2012    L MCA (Afib RVR while admitted for hypotension) - expressive aphasia (almost totally recovered)  . Seizures     Post CVA; on Keppra  . OSA on CPAP   . Paroxysmal atrial fibrillation     on coumadin; Diagnosed at the time of his 1st CVA  . Obesity, morbid     BMI  . Heart attack 1997    inferior MI with left circumflex stent  . CAD S/P percutaneous coronary angioplasty 1997    status post remote PCI to the LAD and circumflex in 1997, heart cath in 2008 showed widely patent stents in the circumflex and LAD.  RCA had a mid lesion and then was totally occluded distally.  . Myelodysplastic syndrome     w/mild anemia an neutropenia  . BPH (benign prostatic hyperplasia)     TURP in 12/2010  . CHF (congestive heart failure)   . Asbestosis     family unclear where he was evaluated in the past    PAST SURGICAL HISTORY: Past Surgical History    Procedure Laterality Date  . Transthoracic echocardiogram  08/06/11    EF 55% to 65% with grade2 diastolic dysfunction/pseudonormal with mildly dilated left atrium, had evidence of elevated filling ressures in the left vientricle and left atrium  . Cardiac catheterization  02/08/07    widely patent stent in the circumflex and LAD.  RCA had a mid lesion and then was totally occluded distally. Cook bare-metal 3.5x20 mm stent  . Transurethral resection of prostate  12/2010  . Nm myoview ltd  01/09/1999    Inferior wall thinning with possible mild ischemia versus infarct. This would correlate well with his known RCA occlusion    FAMILY HISTORY: Family History  Problem Relation Age of Onset  . Heart disease Mother   . Heart attack Father 34    SOCIAL HISTORY: History   Social History  . Marital Status:  Widowed    Spouse Name: N/A    Number of Children: 24  . Years of Education: 11   Occupational History  . Not on file.   Social History Main Topics  . Smoking status: Former Smoker    Types: Cigarettes    Quit date: 03/08/1988  . Smokeless tobacco: Never Used  . Alcohol Use: No  . Drug Use: No  . Sexual Activity: Not on file   Other Topics Concern  . Not on file   Social History Narrative   Pt is widowed father of 41, 21 grandchildren, 28 great-grandchildren and 3 great-great-grandchildren.  He does not get routine exercise.   Patient has a high school education.   Patient is right-handed.   Patient drinks one cup of coffee daily.      PHYSICAL EXAM  Filed Vitals:   01/08/14 1332  BP: 168/75  Pulse: 83  Height: 5\' 4"  (1.626 m)  Weight: 251 lb (113.853 kg)   Body mass index is 43.06 kg/(m^2).  Generalized: Well developed, in no acute distress   Neurological examination  Mentation: Alert oriented to time, place, history taking. Follows all commands speech and language fluent Cranial nerve II-XII: Pupils were equal round reactive to light. Extraocular movements  were full, visual field were full on confrontational test. Facial sensation and strength were normal.  Uvula tongue midline. Head turning and shoulder shrug  were normal and symmetric. Motor: The motor testing reveals 5 over 5 strength of all 4 extremities. Good symmetric motor tone is noted throughout.  Sensory: Sensory testing is intact to soft touch on all 4 extremities. No evidence of extinction is noted.  Coordination: Cerebellar testing reveals good finger-nose-finger and heel-to-shin bilaterally.  Gait and station: patient uses a cane when ambulating. Stooped posture- gait is slow and cautious. Tandem gait not attempted. Romberg is negative. No drift is seen.  Reflexes: Deep tendon reflexes are symmetric and normal bilaterally.     DIAGNOSTIC DATA (LABS, IMAGING, TESTING) - I reviewed patient records, labs, notes, testing and imaging myself where available.  Lab Results  Component Value Date   WBC 3.3* 10/03/2013   HGB 11.2* 10/03/2013   HCT 36.0* 10/03/2013   MCV 81.2 10/03/2013   PLT 85* 10/03/2013      Component Value Date/Time   NA 138 10/03/2013 1235   NA 141 07/15/2013 1208   K 3.8 10/03/2013 1235   K 3.8 07/15/2013 1208   CL 104 07/15/2013 1208   CL 109* 01/02/2012 1526   CO2 24 10/03/2013 1235   CO2 24 07/15/2013 1208   GLUCOSE 93 10/03/2013 1235   GLUCOSE 108* 07/15/2013 1208   GLUCOSE 118* 01/02/2012 1526   BUN 8.5 10/03/2013 1235   BUN 10 07/15/2013 1208   CREATININE 0.9 10/03/2013 1235   CREATININE 0.88 07/15/2013 1208   CALCIUM 8.7 10/03/2013 1235   CALCIUM 8.6 07/15/2013 1208   PROT 9.4* 10/03/2013 1235   PROT 8.1 07/15/2013 1208   ALBUMIN 3.6 10/03/2013 1235   ALBUMIN 2.6* 07/15/2013 1208   AST 22 10/03/2013 1235   AST 19 07/15/2013 1208   ALT 12 10/03/2013 1235   ALT 18 07/15/2013 1208   ALKPHOS 66 10/03/2013 1235   ALKPHOS 56 07/15/2013 1208   BILITOT 0.41 10/03/2013 1235   BILITOT 0.4 07/15/2013 1208   GFRNONAA 78* 07/15/2013 1208   GFRAA  >90 07/15/2013 1208   Lab Results  Component Value Date   CHOL 100 08/10/2011   HDL 27* 08/10/2011  LDLCALC 52 08/10/2011   TRIG 109 06/22/2013   CHOLHDL 3.7 08/10/2011   Lab Results  Component Value Date   HGBA1C 5.7* 03/20/2012   No results found for: [id number] Lab Results  Component Value Date   TSH 2.055 08/05/2011      ASSESSMENT AND PLAN 80 y.o. year old male  has a past medical history of Diabetes mellitus; Hypertension; Stroke (08/2011, 03/2012); Seizures; OSA on CPAP; Paroxysmal atrial fibrillation; Obesity, morbid; Heart attack (1997); CAD S/P percutaneous coronary angioplasty (1997); Myelodysplastic syndrome; BPH (benign prostatic hyperplasia); CHF (congestive heart failure); and Asbestosis. here with:  1. Hx of hemorrhagic stroke 2. Seizures  Patient is doing well. No further stroke symptoms. He continues to take Keppra- he has had no further seizure events. Patient will continue Keppra- I will refill today. Patient's BP was slightly elevated today. He sees his PCP regularly and they manage his BP medications and check his cholesterol levels. I have encouraged the patient to try to participate in daily exercise activities. If the patient develops any new symptoms he will let us know. Otherwise he will followup in one year or sooner if needed.   Ward Givens, MSN, NP-C 01/08/2014, 1:35 PM [facility] [address] [place], [place] [address] [phone]  Note: This document was prepared with digital dictation and possible smart phrase technology. Any transcriptional errors that result from this process are unintentional.

## 2014-01-08 NOTE — Patient Instructions (Signed)
Stroke Prevention Some health problems and behaviors may make it more likely for you to have a stroke. Below are ways to lessen your risk of having a stroke.   Be active for at least 30 minutes on most or all days.  Do not smoke. Try not to be around others who smoke.  Do not drink too much alcohol.  Do not have more than 2 drinks a day if you are a man.  Do not have more than 1 drink a day if you are a woman and are not pregnant.  Eat healthy foods, such as fruits and vegetables. If you were put on a specific diet, follow the diet as told.  Keep your cholesterol levels under control through diet and medicines. Look for foods that are low in saturated fat, trans fat, cholesterol, and are high in fiber.  If you have diabetes, follow all diet plans and take your medicine as told.  If you have high blood pressure (hypertension), follow all diet plans and take your medicine as told.  Keep a healthy weight. Eat foods that are low in calories, salt, saturated fat, trans fat, and cholesterol.  Do not take drugs.  Avoid birth control pills, if this applies. Talk to your doctor about the risks of taking birth control pills.  Talk to your doctor if you have sleep problems (sleep apnea).  Take all medicine as told by your doctor.  You may be told to take aspirin or blood thinner medicine. Take this medicine as told by your doctor.  Understand your medicine instructions.  Make sure any other conditions you have are being taken care of. GET HELP RIGHT AWAY IF:  You suddenly lose feeling (you feel numb) or have weakness in your face, arm, or leg.  Your face or eyelid hangs down to one side.  You suddenly feel confused.  You have trouble talking (aphasia) or understanding what people are saying.  You suddenly have trouble seeing in one or both eyes.  You suddenly have trouble walking.  You are dizzy.  You lose your balance or your movements are clumsy (uncoordinated).  You  suddenly have a very bad headache and you do not know the cause.  You have new chest pain.  Your heart feels like it is fluttering or skipping a beat (irregular heartbeat). Do not wait to see if the symptoms above go away. Get help right away. Call your local emergency services (911 in [place] Do not drive yourself to the hospital. Document Released: 08/22/2011 Document Revised: 07/07/2013 Document Reviewed: 08/23/2012 [employer] Patient Information 2015 [employer]. This information is not intended to replace advice given to you by your health care provider. Make sure you discuss any questions you have with your health care provider.  

## 2014-01-21 ENCOUNTER — Encounter: Payer: Self-pay | Admitting: Neurology

## 2014-01-26 ENCOUNTER — Ambulatory Visit (INDEPENDENT_AMBULATORY_CARE_PROVIDER_SITE_OTHER): Payer: Medicare HMO | Admitting: Pharmacist Clinician (PhC)/ Clinical Pharmacy Specialist

## 2014-01-26 DIAGNOSIS — I639 Cerebral infarction, unspecified: Secondary | ICD-10-CM

## 2014-01-26 DIAGNOSIS — I4891 Unspecified atrial fibrillation: Secondary | ICD-10-CM

## 2014-01-26 DIAGNOSIS — Z7901 Long term (current) use of anticoagulants: Secondary | ICD-10-CM

## 2014-01-26 LAB — POCT INR: INR: 2.6

## 2014-01-27 ENCOUNTER — Encounter: Payer: Self-pay | Admitting: Neurology

## 2014-01-28 ENCOUNTER — Telehealth: Payer: Self-pay | Admitting: *Deleted

## 2014-01-28 NOTE — Telephone Encounter (Signed)
Called cell number. Spoke to Ms Bobbye Charleston.   SHE states she was not with patient at present time.   RN informed her that ofifce received information from [place]- ,patient had two medication refilled month apart-  Making sure Patient was not taking both at the same time. (Cartia XT and Dilt-XR) Medication spelled to Ms Bobbye [place]. She states she will contact patient and his son ,to inform them.

## 2014-02-02 ENCOUNTER — Ambulatory Visit (INDEPENDENT_AMBULATORY_CARE_PROVIDER_SITE_OTHER): Payer: Medicare HMO | Admitting: Pulmonary Disease

## 2014-02-02 ENCOUNTER — Encounter: Payer: Self-pay | Admitting: Pulmonary Disease

## 2014-02-02 ENCOUNTER — Ambulatory Visit (INDEPENDENT_AMBULATORY_CARE_PROVIDER_SITE_OTHER)
Admission: RE | Admit: 2014-02-02 | Discharge: 2014-02-02 | Disposition: A | Discharge status: Home / Self Care | Payer: Medicare HMO | Source: Ambulatory Visit | Attending: Pulmonary Disease | Admitting: Pulmonary Disease

## 2014-02-02 VITALS — BP 142/76 | HR 70 | Ht 65.0 in | Wt 256.0 lb

## 2014-02-02 DIAGNOSIS — J942 Hemothorax: Secondary | ICD-10-CM

## 2014-02-02 DIAGNOSIS — Z23 Encounter for immunization: Secondary | ICD-10-CM

## 2014-02-02 NOTE — Patient Instructions (Signed)
We will see you back in 6 months with a chest x-ray before that visit

## 2014-02-02 NOTE — Progress Notes (Signed)
Subjective:    Patient ID: Terry Robertson, male    DOB: 07-21-1928, 80 y.o.   MRN: [id number]  Synopsis: Terry Robertson had a hemothorax in 06/2013 for CAP and a hemothorax while on warfarin.  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  HPI  Chief Complaint  Patient presents with  . Follow-up    review cxr.  Pt has no breathing complaints at this time.    02/02/2014 ROV > Terry Robertson is doing well, he has had a good Thanksgiving .  He has no breathing complaints or cough.  He has had no chest pain, no cough, and his weight has been stable. Overall he feels quite well.  Past Medical History  Diagnosis Date  . Diabetes mellitus   . Hypertension   . Stroke 08/2011, 03/2012    L MCA (Afib RVR while admitted for hypotension) - expressive aphasia (almost totally recovered)  . Seizures     Post CVA; on Keppra  . OSA on CPAP   . Paroxysmal atrial fibrillation     on coumadin; Diagnosed at the time of his 1st CVA  . Obesity, morbid     BMI  . Heart attack 1997    inferior MI with left circumflex stent  . CAD S/P percutaneous coronary angioplasty 1997    status post remote PCI to the LAD and circumflex in 1997, heart cath in 2008 showed widely patent stents in the circumflex and LAD.  RCA had a mid lesion and then was totally occluded distally.  . Myelodysplastic syndrome     w/mild anemia an neutropenia  . BPH (benign prostatic hyperplasia)     TURP in 12/2010  . CHF (congestive heart failure)   . Asbestosis     family unclear where he was evaluated in the past     Review of Systems      Objective:   Physical Exam  Filed Vitals:   02/02/14 1532  BP: 142/76  Pulse: 70  Height: 5\' 5"  (1.651 m)  Weight: 256 lb (116.121 kg)  SpO2: 96%  RA  Gen: well appearing, no acute distress HEENT: NCAT,EOMi, OP clear PULM: diminished R base, otherwise clear CV: RRR, no mgr, no JVD AB: BS+, soft, nontender,  Ext: warm, no edema, no clubbing, no  cyanosis Derm: no rash or skin breakdown Neuro: A&Ox4, MAEW  02/02/2014 CXR reviewed by me> mild scarring and scant effusion left base, slight improvement from prior studies.     Assessment & Plan:   Hemothorax, left Terry Robertson had a hemothorax in the setting of chronic Coumadin use nearly a year ago now. There is never a clear etiology for this other than his warfarin use. His CT chest has shown nonspecific findings. He and his significant other both understand that without a tissue diagnosis there is the remote possibility that he could have an occult pleural malignancy. However, the fact that he has done fairly well since the last visit would argue that there is not an aggressive cancer right now. Despite the relative improvement since the spring, he is still a very chronically ill fellow and would not do well with a lung biopsy. So at this point we will continue to monitor him with every 6 month visits and chest x-rays.    Updated Medication List Outpatient Encounter Prescriptions as of 02/02/2014  Medication Sig  . albuterol (PROVENTIL HFA;VENTOLIN HFA) 108 (90 BASE) MCG/ACT inhaler Inhale 2 puffs into the lungs  every 6 (six) hours as needed for wheezing or shortness of breath.  Marland Kitchen aspirin EC 81 MG tablet Take 81 mg by mouth daily.  Marland Kitchen atenolol (TENORMIN) 25 MG tablet Take 1 tablet (25 mg total) by mouth daily.  Marland Kitchen atorvastatin (LIPITOR) 20 MG tablet Take 20 mg by mouth daily.  . bisacodyl (DULCOLAX) 5 MG EC tablet Take 5 mg by mouth daily as needed for moderate constipation.  Marland Kitchen CARTIA XT 180 MG 24 hr capsule   . diltiazem (DILACOR XR) 180 MG 24 hr capsule Take 180 mg by mouth daily.  . fish oil-omega-3 fatty acids 1000 MG capsule Take 1 g by mouth daily.  . furosemide (LASIX) 20 MG tablet Take 1 tablet (20 mg total) by mouth daily with breakfast.  . gabapentin (NEURONTIN) 100 MG capsule Take 100 mg by mouth daily.  Marland Kitchen HYDROcodone-acetaminophen (NORCO/VICODIN) 5-325 MG per tablet Take 1  tablet by mouth every 4 (four) hours as needed for moderate pain.  Vanessa Kick Ethyl (VASCEPA) 1 G CAPS Take 1 g by mouth daily.  Marland Kitchen levETIRAcetam (KEPPRA) 500 MG tablet Take 1 tablet (500 mg total) by mouth every 12 (twelve) hours.  . Vitamin D, Ergocalciferol, (DRISDOL) 50000 UNITS CAPS Take 50,000 Units by mouth 2 (two) times a week. Takes 1 capsule on Wednesday and Saturday every week  . warfarin (COUMADIN) 5 MG tablet Take 1 tablet (5 mg total) by mouth daily.

## 2014-02-02 NOTE — Assessment & Plan Note (Signed)
Terry Robertson had a hemothorax in the setting of chronic Coumadin use nearly a year ago now. There is never a clear etiology for this other than his warfarin use. His CT chest has shown nonspecific findings. He and his significant other both understand that without a tissue diagnosis there is the remote possibility that he could have an occult pleural malignancy. However, the fact that he has done fairly well since the last visit would argue that there is not an aggressive cancer right now. Despite the relative improvement since the spring, he is still a very chronically ill fellow and would not do well with a lung biopsy. So at this point we will continue to monitor him with every 6 month visits and chest x-rays.

## 2014-02-02 NOTE — Addendum Note (Signed)
Addended by: Len Blalock on: 02/02/2014 04:04 PM   Modules accepted: Orders

## 2014-02-12 ENCOUNTER — Telehealth: Payer: Self-pay | Admitting: Internal Medicine

## 2014-02-12 NOTE — Telephone Encounter (Signed)
s.w. [facility] [phone] ext 220.Marland Kitchen..sched pt for an appt she will contact pt with d.t

## 2014-02-16 ENCOUNTER — Ambulatory Visit (HOSPITAL_BASED_OUTPATIENT_CLINIC_OR_DEPARTMENT_OTHER): Payer: Medicare HMO | Admitting: Physician Assistant

## 2014-02-16 ENCOUNTER — Other Ambulatory Visit (HOSPITAL_BASED_OUTPATIENT_CLINIC_OR_DEPARTMENT_OTHER): Payer: Medicare HMO

## 2014-02-16 ENCOUNTER — Encounter: Payer: Self-pay | Admitting: Physician Assistant

## 2014-02-16 ENCOUNTER — Telehealth: Payer: Self-pay | Admitting: Physician Assistant

## 2014-02-16 VITALS — BP 143/67 | HR 77 | Temp 98.5°F | Resp 18 | Ht 65.0 in | Wt 248.0 lb

## 2014-02-16 DIAGNOSIS — I4891 Unspecified atrial fibrillation: Secondary | ICD-10-CM

## 2014-02-16 DIAGNOSIS — D469 Myelodysplastic syndrome, unspecified: Secondary | ICD-10-CM

## 2014-02-16 LAB — CBC WITH DIFFERENTIAL/PLATELET
BASO%: 0 % (ref 0.0–2.0)
Basophils Absolute: 0 10*3/uL (ref 0.0–0.1)
EOS%: 1.2 % (ref 0.0–7.0)
Eosinophils Absolute: 0 10*3/uL (ref 0.0–0.5)
HCT: 35.8 % — ABNORMAL LOW (ref 38.4–49.9)
HGB: 11 g/dL — ABNORMAL LOW (ref 13.0–17.1)
LYMPH%: 50.8 % — AB (ref 14.0–49.0)
MCH: 25.2 pg — ABNORMAL LOW (ref 27.2–33.4)
MCHC: 30.7 g/dL — AB (ref 32.0–36.0)
MCV: 82.1 fL (ref 79.3–98.0)
MONO#: 1.3 10*3/uL — ABNORMAL HIGH (ref 0.1–0.9)
MONO%: 40.9 % — ABNORMAL HIGH (ref 0.0–14.0)
NEUT#: 0.2 10*3/uL — CL (ref 1.5–6.5)
NEUT%: 7.1 % — ABNORMAL LOW (ref 39.0–75.0)
NRBC: 0 % (ref 0–0)
PLATELETS: 95 10*3/uL — AB (ref 140–400)
RBC: 4.36 10*6/uL (ref 4.20–5.82)
RDW: 16.1 % — ABNORMAL HIGH (ref 11.0–14.6)
WBC: 3.3 10*3/uL — ABNORMAL LOW (ref 4.0–10.3)
lymph#: 1.7 10*3/uL (ref 0.9–3.3)

## 2014-02-16 LAB — COMPREHENSIVE METABOLIC PANEL (CC13)
ALK PHOS: 69 U/L (ref 40–150)
ALT: 14 U/L (ref 0–55)
AST: 22 U/L (ref 5–34)
Albumin: 3.2 g/dL — ABNORMAL LOW (ref 3.5–5.0)
Anion Gap: 8 mEq/L (ref 3–11)
BUN: 12.4 mg/dL (ref 7.0–26.0)
CALCIUM: 8.7 mg/dL (ref 8.4–10.4)
CHLORIDE: 106 meq/L (ref 98–109)
CO2: 24 mEq/L (ref 22–29)
CREATININE: 1 mg/dL (ref 0.7–1.3)
EGFR: 78 mL/min/{1.73_m2} — ABNORMAL LOW (ref >90)
Glucose: 78 mg/dl (ref 70–140)
Potassium: 3.7 mEq/L (ref 3.5–5.1)
Sodium: 138 mEq/L (ref 136–145)
Total Bilirubin: 0.4 mg/dL (ref 0.20–1.20)
Total Protein: 10.1 g/dL — ABNORMAL HIGH (ref 6.4–8.3)

## 2014-02-16 LAB — LACTATE DEHYDROGENASE (CC13): LDH: 259 U/L — ABNORMAL HIGH (ref 125–245)

## 2014-02-16 NOTE — Telephone Encounter (Signed)
Gave avs & cal for Dec. °

## 2014-02-16 NOTE — Progress Notes (Signed)
[facility] OFFICE PROGRESS NOTE  [name], MD [address] [place] [address]  DIAGNOSIS: Myelodysplastic syndrome  No chief complaint on file.   CURRENT TREATMENT: Observation.   INTERVAL HISTORY: Terry Robertson 80 y.o. male with a history of MDS is here for follow-up visit.  He was last seen by Dr. Juliann Mule on 10/03/2013.  He is a gentleman who has been following since October 2009, with an isolated neutropenia. Bone marrow biopsy performed 03/31/2008 showed an element of both myeloproliferative as well as myelodysplastic syndrome, but overall Dr. Juliann Mule felt that the features were consistent with myelodysplastic syndrome. Dr. Juliann Mule elected to treat him with supportive care only for the time being. He has been completely asymptomatic. He has not reported any recurrent sign of pulmonary infection. He is not having increased blast. His cytogenetics are normal.   He has received one Neulasta injection in the past and has helped his blood count dramatically and it is good to know that he has a good response to it down the line.   He was admitted to [facility] back in April (04/18-04/25) for acute hypoxic respiratory failure, left hemothorax and pneumonia complicated by hemorrhagic transformation secondary to coumadin.  He continues on his coumadin. He denies any bleeding complications. Since his last office visit here on 10/03/2013 he has not been in the emergency room nor had any further hospitalizations. He voices no specific complaints other than some constipation that is well managed with Dulcolax. He was supposed to followed up with Dr. Juliann Mule in October 2015. Patient was unaware that he had an appointment. He is accompanied by a friend today who ensures that he will keep his future appointments.  MEDICAL HISTORY: Past Medical History  Diagnosis Date  . Diabetes mellitus   . Hypertension   . Stroke 08/2011, 03/2012    L MCA (Afib RVR while admitted  for hypotension) - expressive aphasia (almost totally recovered)  . Seizures     Post CVA; on Keppra  . OSA on CPAP   . Paroxysmal atrial fibrillation     on coumadin; Diagnosed at the time of his 1st CVA  . Obesity, morbid     BMI  . Heart attack 1997    inferior MI with left circumflex stent  . CAD S/P percutaneous coronary angioplasty 1997    status post remote PCI to the LAD and circumflex in 1997, heart cath in 2008 showed widely patent stents in the circumflex and LAD.  RCA had a mid lesion and then was totally occluded distally.  . Myelodysplastic syndrome     w/mild anemia an neutropenia  . BPH (benign prostatic hyperplasia)     TURP in 12/2010  . CHF (congestive heart failure)   . Asbestosis     family unclear where he was evaluated in the past    INTERIM HISTORY: has CAD PCI to LAD/CFX in 1997. Cath '08- medical Rx; Morbid obesity; Myelodysplastic syndrome; Dyslipidemia, goal LDL below 70; Essential hypertension; S/P TURP Oct 2012; DM type 2 (diabetes mellitus, type 2); Diastolic dysfunction: Grade 2; Thrombocytopenia, chronic; History of likely cardioembolic stroke (05/06/4399) with onset of new diagnosis A. fib; expressive aphasia; Acute respiratory failure; Altered mental status, suspect post ictal; Seizure, resolved; Fever, unclear etiology; Pulmonary edema; CVA (cerebral infarction); Long term (current) use of anticoagulants; Other convulsions; Severe obesity (BMI >= 40); PAF (paroxysmal atrial fibrillation); Seizure disorder; CAP (community acquired pneumonia); Pneumonia; Hemothorax, left; and Left ventricular diastolic dysfunction, NYHA class  2 (grade 2 dysfunction with elevated filling pressures) on his problem list.    ALLERGIES:  has No Known Allergies.  MEDICATIONS: has a current medication list which includes the following prescription(s): aspirin ec, atenolol, bisacodyl, diltiazem, fish oil-omega-3 fatty acids, furosemide, gabapentin, hydrocodone-acetaminophen, icosapent  ethyl, levetiracetam, vitamin d (ergocalciferol), warfarin, albuterol, atorvastatin, and cartia xt.  SURGICAL HISTORY:  Past Surgical History  Procedure Laterality Date  . Transthoracic echocardiogram  08/06/11    EF 55% to 65% with grade2 diastolic dysfunction/pseudonormal with mildly dilated left atrium, had evidence of elevated filling ressures in the left vientricle and left atrium  . Cardiac catheterization  02/08/07    widely patent stent in the circumflex and LAD.  RCA had a mid lesion and then was totally occluded distally. Cook bare-metal 3.5x20 mm stent  . Transurethral resection of prostate  12/2010  . Nm myoview ltd  01/09/1999    Inferior wall thinning with possible mild ischemia versus infarct. This would correlate well with his known RCA occlusion    REVIEW OF SYSTEMS:   Constitutional: Denies fevers, chills or abnormal weight loss Eyes: Denies blurriness of vision Ears, nose, mouth, throat, and face: Denies mucositis or sore throat Respiratory: Denies cough, dyspnea or wheezes Cardiovascular: Denies palpitation, chest discomfort or lower extremity swelling Gastrointestinal:  Denies nausea, heartburn but reports constipation Skin: Denies abnormal skin rashes Lymphatics: Denies new lymphadenopathy or easy bruising Neurological:Denies numbness, tingling or new weaknesses Behavioral/Psych: Mood is stable, no new changes  All other systems were reviewed with the patient and are negative.  PHYSICAL EXAMINATION: ECOG PERFORMANCE STATUS: 1 - Symptomatic but completely ambulatory  Blood pressure 143/67, pulse 77, temperature 98.5 F (36.9 C), temperature source Oral, resp. rate 18, height _0  (1.651 m), weight 248 lb (112.492 kg), SpO2 100 %.  GENERAL:alert, no distress and comfortable; ambulates with a cane, presents in a wheelchair today SKIN: skin color, texture, turgor are normal, no rashes or significant lesions EYES: normal, Conjunctiva are pink and non-injected, sclera  clear OROPHARYNX:no exudate, no erythema and lips, buccal mucosa, and tongue normal  NECK: supple, thyroid normal size, non-tender, without nodularity LYMPH:  no palpable lymphadenopathy in the cervical, axillary or supraclavicular LUNGS: clear to auscultation with normal breathing effort, no wheezes or rhonchi HEART: regular rate & rhythm and no murmurs and no lower extremity edema ABDOMEN: obese abdomen soft, non-tender and normal bowel sounds Musculoskeletal:no cyanosis of digits and no clubbing  NEURO: alert & oriented x 3 with fluent speech, no focal motor/sensory deficits  Labs:  Lab Results  Component Value Date   WBC 3.3* 02/16/2014   HGB 11.0* 02/16/2014   HCT 35.8* 02/16/2014   MCV 82.1 02/16/2014   PLT 95* 02/16/2014   NEUTROABS 0.2* 02/16/2014      Chemistry      Component Value Date/Time   NA 138 02/16/2014 1322   NA 141 07/15/2013 1208   K 3.7 02/16/2014 1322   K 3.8 07/15/2013 1208   CL 104 07/15/2013 1208   CL 109* 01/02/2012 1526   CO2 24 02/16/2014 1322   CO2 24 07/15/2013 1208   BUN 12.4 02/16/2014 1322   BUN 10 07/15/2013 1208   CREATININE 1.0 02/16/2014 1322   CREATININE 0.88 07/15/2013 1208      Component Value Date/Time   CALCIUM 8.7 02/16/2014 1322   CALCIUM 8.6 07/15/2013 1208   ALKPHOS 69 02/16/2014 1322   ALKPHOS 56 07/15/2013 1208   AST 22 02/16/2014 1322   AST 19 07/15/2013 1208  ALT 14 02/16/2014 1322   ALT 18 07/15/2013 1208   BILITOT 0.40 02/16/2014 1322   BILITOT 0.4 07/15/2013 1208       GFR Estimated Creatinine Clearance: 64.8 mL/min (by C-G formula based on Cr of 1). Coagulation profile No results for input(s): INR, PROTIME in the last 168 hours.  Studies:  No results found.   RADIOGRAPHIC STUDIES: No results found.  ASSESSMENT: Terry Robertson 80 y.o. male with a history of Myelodysplastic syndrome   PLAN:   1. Myelodysplastic/myeloproliferative disorder, JAK2 mutation is negative.  --His cytogenetics normal, but  for the time being it is more likely myelodysplastic syndrome more than anything else. Patient was reviewed with Dr. Alvy Bimler. He will likely need a repeat bone marrow biopsy/aspirate. She will see him for evaluation in 1 month with laboratory studies. In the interim he will remain on observation. We will continue supportive measures at this time. He does not need any growth factor support unless he has a symptomatology such as recurrent infection etc. further frequency of follow-up will be determined by Dr. Alvy Bimler.     2. Cytopenias. --No need for any transfusions or growth factor support at this time. As noted in #1. His hemoglobin is 11.0, plts 95. His total white count is stable at 3.3 however his ANC is slightly lower at 0.2. In July 2015 his ANC was 0.3.  3. Atrial Fibrillation. --He was admitted to [facility] back in April (04/18 -04/25) for acute hypoxic respiratory failure, left hemothorax and pneumonia complicated by hemorrhagic transformation.  4. Follow-up. --He will follow up in 1 month for symptom visit and repeat labs per Dr. Alvy Bimler.    All questions were answered. The patient knows to call the clinic with any problems, questions or concerns. We can certainly see the patient much sooner if necessary.  I spent 15 minutes counseling the patient face to face. The total time spent in the appointment was 25 minutes.    Carlton Adam, PA-C 02/16/2014 3:34 PM

## 2014-02-16 NOTE — Patient Instructions (Signed)
Follow-up in one month with repeat labs

## 2014-03-02 ENCOUNTER — Encounter: Payer: Self-pay | Admitting: Podiatry

## 2014-03-02 ENCOUNTER — Ambulatory Visit (INDEPENDENT_AMBULATORY_CARE_PROVIDER_SITE_OTHER): Payer: Medicare HMO | Admitting: Podiatry

## 2014-03-02 DIAGNOSIS — B351 Tinea unguium: Secondary | ICD-10-CM

## 2014-03-02 DIAGNOSIS — M79676 Pain in unspecified toe(s): Secondary | ICD-10-CM

## 2014-03-02 NOTE — Progress Notes (Signed)
Patient ID: Terry Robertson, male   DOB: [birth year], 80 y.o.   MRN: [id number] Subjective: This patient presents again complaining of painful toenails and walking wearing shoes  Objective: Orientated 3 patient presents with son The toenails are elongated, hypertrophic, discolored, incurvated and tender to palpation 6-10 Occasional plantar scaling noted bilaterally  Assessment: Symptomatic onychomycoses 6-10 Improving tinea pedis bilaterally  Plan: Nails 10 are debrided without a bleeding Advised patient to continue applying over-the-counter Lotrimin cream daily additional 30 days  Reappoint 3 months

## 2014-03-02 NOTE — Patient Instructions (Signed)
Continue to apply Lotrimin cream over-the-counter to the skin on the right and left feet daily 30 days

## 2014-03-08 ENCOUNTER — Other Ambulatory Visit: Payer: Self-pay | Admitting: Nurse Practitioner

## 2014-03-09 ENCOUNTER — Ambulatory Visit (INDEPENDENT_AMBULATORY_CARE_PROVIDER_SITE_OTHER): Payer: Medicare HMO | Admitting: Pharmacist Clinician (PhC)/ Clinical Pharmacy Specialist

## 2014-03-09 DIAGNOSIS — I4891 Unspecified atrial fibrillation: Secondary | ICD-10-CM

## 2014-03-09 DIAGNOSIS — Z7901 Long term (current) use of anticoagulants: Secondary | ICD-10-CM

## 2014-03-09 DIAGNOSIS — I639 Cerebral infarction, unspecified: Secondary | ICD-10-CM

## 2014-03-09 LAB — POCT INR: INR: 2.2

## 2014-03-19 ENCOUNTER — Telehealth: Payer: Self-pay | Admitting: Hematology and Oncology

## 2014-03-19 ENCOUNTER — Other Ambulatory Visit: Payer: Self-pay | Admitting: Hematology and Oncology

## 2014-03-19 ENCOUNTER — Other Ambulatory Visit (HOSPITAL_BASED_OUTPATIENT_CLINIC_OR_DEPARTMENT_OTHER): Payer: Commercial Managed Care - HMO

## 2014-03-19 ENCOUNTER — Ambulatory Visit (HOSPITAL_BASED_OUTPATIENT_CLINIC_OR_DEPARTMENT_OTHER): Payer: Commercial Managed Care - HMO | Admitting: Hematology and Oncology

## 2014-03-19 VITALS — BP 106/87 | HR 76 | Temp 98.0°F | Resp 18 | Ht 65.0 in | Wt 249.7 lb

## 2014-03-19 DIAGNOSIS — D469 Myelodysplastic syndrome, unspecified: Secondary | ICD-10-CM

## 2014-03-19 LAB — CBC & DIFF AND RETIC
BASO%: 0.3 % (ref 0.0–2.0)
Basophils Absolute: 0 10*3/uL (ref 0.0–0.1)
EOS%: 0.5 % (ref 0.0–7.0)
Eosinophils Absolute: 0 10*3/uL (ref 0.0–0.5)
HEMATOCRIT: 34.8 % — AB (ref 38.4–49.9)
HGB: 10.5 g/dL — ABNORMAL LOW (ref 13.0–17.1)
IMMATURE RETIC FRACT: 5.4 % (ref 3.00–10.60)
LYMPH%: 44.3 % (ref 14.0–49.0)
MCH: 24.5 pg — ABNORMAL LOW (ref 27.2–33.4)
MCHC: 30.2 g/dL — AB (ref 32.0–36.0)
MCV: 81.1 fL (ref 79.3–98.0)
MONO#: 1.7 10*3/uL — ABNORMAL HIGH (ref 0.1–0.9)
MONO%: 45.9 % — AB (ref 0.0–14.0)
NEUT#: 0.3 10*3/uL — CL (ref 1.5–6.5)
NEUT%: 9 % — AB (ref 39.0–75.0)
NRBC: 0 % (ref 0–0)
Platelets: 120 10*3/uL — ABNORMAL LOW (ref 140–400)
RBC: 4.29 10*6/uL (ref 4.20–5.82)
RDW: 16.5 % — AB (ref 11.0–14.6)
Retic %: 1.11 % (ref 0.80–1.80)
Retic Ct Abs: 47.62 10*3/uL (ref 34.80–93.90)
WBC: 3.8 10*3/uL — ABNORMAL LOW (ref 4.0–10.3)
lymph#: 1.7 10*3/uL (ref 0.9–3.3)

## 2014-03-19 LAB — MORPHOLOGY: PLT EST: DECREASED

## 2014-03-19 NOTE — Telephone Encounter (Signed)
gv and printed appt sch3ed and avs for pt for SEpt

## 2014-03-20 NOTE — Progress Notes (Signed)
[facility] progress notes  Patient Care Team: [name], MD as PCP - General (Internal Medicine) Heath Lark, MD as Consulting Physician (Hematology and Oncology)  CHIEF COMPLAINTS/PURPOSE OF VISIT:  Low-grade myelodysplastic syndrome  HISTORY OF PRESENTING ILLNESS:  Terry Robertson 81 y.o. male was transferred to my care after his prior physician has left.  I reviewed the patient's records extensive and collaborated the history with the patient. Summary of his history is as follows:  He is a gentleman who has been following since October 2009, with an isolated neutropenia. Bone marrow biopsy showed an element of both myeloproliferative as well as myelodysplastic syndrome, but overall I felt that the features are consistent with myelodysplastic syndrome. I have elected to treatment him with supportive care only for the time being. He has been completely asymptomatic. He has not reported any recurrent sign of pulmonary infection. He is not having increased blast. His cytogenetics are normal. He denies any recent emergency room visits or hospitalizations.  He has received one Neulasta injection in the past and has helped his blood count dramatically and it is good to know that he has a good response to it down the line.  He has not reported any peripheral neuropathy. He has not reported any other complaints. He was admitted to [facility] back in April (04/18-04/25) for acute hypoxic respiratory failure, left hemothorax and pneumonia complicated by hemorrhagic transformation secondary to coumadin.  He is back on his coumadin.  He denies any bleeding complications.   He denies recent infection.  MEDICAL HISTORY:  Past Medical History  Diagnosis Date  . Diabetes mellitus   . Hypertension   . Stroke 08/2011, 03/2012    L MCA (Afib RVR while admitted for hypotension) - expressive aphasia (almost totally recovered)  . Seizures     Post CVA; on Keppra  . OSA on CPAP    . Paroxysmal atrial fibrillation     on coumadin; Diagnosed at the time of his 1st CVA  . Obesity, morbid     BMI  . Heart attack 1997    inferior MI with left circumflex stent  . CAD S/P percutaneous coronary angioplasty 1997    status post remote PCI to the LAD and circumflex in 1997, heart cath in 2008 showed widely patent stents in the circumflex and LAD.  RCA had a mid lesion and then was totally occluded distally.  . Myelodysplastic syndrome     w/mild anemia an neutropenia  . BPH (benign prostatic hyperplasia)     TURP in 12/2010  . CHF (congestive heart failure)   . Asbestosis     family unclear where he was evaluated in the past    SURGICAL HISTORY: Past Surgical History  Procedure Laterality Date  . Transthoracic echocardiogram  08/06/11    EF 55% to 65% with grade2 diastolic dysfunction/pseudonormal with mildly dilated left atrium, had evidence of elevated filling ressures in the left vientricle and left atrium  . Cardiac catheterization  02/08/07    widely patent stent in the circumflex and LAD.  RCA had a mid lesion and then was totally occluded distally. Cook bare-metal 3.5x20 mm stent  . Transurethral resection of prostate  12/2010  . Nm myoview ltd  01/09/1999    Inferior wall thinning with possible mild ischemia versus infarct. This would correlate well with his known RCA occlusion    SOCIAL HISTORY: History   Social History  . Marital Status: Widowed    Spouse Name: N/A  Number of Children: 69  . Years of Education: 11   Occupational History  . Not on file.   Social History Main Topics  . Smoking status: Former Smoker    Types: Cigarettes    Quit date: 03/08/1988  . Smokeless tobacco: Never Used  . Alcohol Use: No  . Drug Use: No  . Sexual Activity: Not on file   Other Topics Concern  . Not on file   Social History Narrative   Pt is widowed father of 3, 53 grandchildren, 66 great-grandchildren and 3 great-great-grandchildren.  He does not get  routine exercise.   Patient has a high school education.   Patient is right-handed.   Patient drinks one cup of coffee daily.    FAMILY HISTORY: Family History  Problem Relation Age of Onset  . Heart disease Mother   . Heart attack Father 44    ALLERGIES:  has No Known Allergies.  MEDICATIONS:  Current Outpatient Prescriptions  Medication Sig Dispense Refill  . albuterol (PROVENTIL HFA;VENTOLIN HFA) 108 (90 BASE) MCG/ACT inhaler Inhale 2 puffs into the lungs every 6 (six) hours as needed for wheezing or shortness of breath. 1 Inhaler 2  . aspirin EC 81 MG tablet Take 81 mg by mouth daily.    Marland Kitchen atenolol (TENORMIN) 25 MG tablet Take 1 tablet (25 mg total) by mouth daily. 30 tablet 6  . atorvastatin (LIPITOR) 20 MG tablet Take 20 mg by mouth daily.    . bisacodyl (DULCOLAX) 5 MG EC tablet Take 5 mg by mouth daily as needed for moderate constipation.    Marland Kitchen CARTIA XT 180 MG 24 hr capsule   3  . diltiazem (DILACOR XR) 180 MG 24 hr capsule Take 180 mg by mouth daily.    . fish oil-omega-3 fatty acids 1000 MG capsule Take 1 g by mouth daily.    . furosemide (LASIX) 20 MG tablet Take 1 tablet (20 mg total) by mouth daily with breakfast. 30 tablet 1  . gabapentin (NEURONTIN) 100 MG capsule Take 100 mg by mouth daily.    Marland Kitchen HYDROcodone-acetaminophen (NORCO/VICODIN) 5-325 MG per tablet Take 1 tablet by mouth every 4 (four) hours as needed for moderate pain.    Vanessa Kick Ethyl (VASCEPA) 1 G CAPS Take 1 g by mouth daily.    Marland Kitchen latanoprost (XALATAN) 0.005 % ophthalmic solution Use as directed  4  . levETIRAcetam (KEPPRA) 500 MG tablet Take 1 tablet (500 mg total) by mouth every 12 (twelve) hours. 60 tablet 11  . levETIRAcetam (KEPPRA) 500 MG tablet TAKE 1 TABLET BY MOUTH EVERY 12 HOURS 60 tablet 9  . Vitamin D, Ergocalciferol, (DRISDOL) 50000 UNITS CAPS Take 50,000 Units by mouth 2 (two) times a week. Takes 1 capsule on Wednesday and Saturday every week    . warfarin (COUMADIN) 5 MG tablet Take 1  tablet (5 mg total) by mouth daily. 135 tablet 1   No current facility-administered medications for this visit.    REVIEW OF SYSTEMS:   Constitutional: Denies fevers, chills or abnormal night sweats Eyes: Denies blurriness of vision, double vision or watery eyes Ears, nose, mouth, throat, and face: Denies mucositis or sore throat Respiratory: Denies cough, dyspnea or wheezes Cardiovascular: Denies palpitation, chest discomfort or lower extremity swelling Gastrointestinal:  Denies nausea, heartburn or change in bowel habits Skin: Denies abnormal skin rashes Lymphatics: Denies new lymphadenopathy or easy bruising Neurological:Denies numbness, tingling or new weaknesses Behavioral/Psych: Mood is stable, no new changes  All other systems were reviewed  with the patient and are negative.  PHYSICAL EXAMINATION: ECOG PERFORMANCE STATUS: 0 - Asymptomatic  Filed Vitals:   03/19/14 1149  BP: 106/87  Pulse: 76  Temp: 98 F (36.7 C)  Resp: 18   Filed Weights   03/19/14 1149  Weight: 249 lb 11.2 oz (113.263 kg)    GENERAL:alert, no distress and comfortable SKIN: skin color, texture, turgor are normal, no rashes or significant lesions EYES: normal, conjunctiva are pink and non-injected, sclera clear OROPHARYNX:no exudate, normal lips, buccal mucosa, and tongue  NECK: supple, thyroid normal size, non-tender, without nodularity LYMPH:  no palpable lymphadenopathy in the cervical, axillary or inguinal LUNGS: clear to auscultation and percussion with normal breathing effort HEART: regular rate & rhythm and no murmurs without lower extremity edema ABDOMEN:abdomen soft, non-tender and normal bowel sounds Musculoskeletal:no cyanosis of digits and no clubbing  PSYCH: alert & oriented x 3 with fluent speech NEURO: no focal motor/sensory deficits  LABORATORY DATA:  I have reviewed the data as listed Lab Results  Component Value Date   WBC 3.8* 03/19/2014   HGB 10.5* 03/19/2014   HCT 34.8*  03/19/2014   MCV 81.1 03/19/2014   PLT 120* 03/19/2014    Recent Labs  06/25/13 0500 06/26/13 0245 07/15/13 1208 08/06/13 1320 10/03/13 1235 02/16/14 1322  NA 146 144 141 141 138 138  K 3.9 4.2 3.8 3.9 3.8 3.7  CL 111 108 104  --   --   --   CO2 _0 20* 24 24  GLUCOSE 141* 143* 108* 89 93 78  BUN 27* 25* 10 8.1 8.5 12.4  CREATININE 0.94 0.92 0.88 0.9 0.9 1.0  CALCIUM 8.6 8.7 8.6 8.9 8.7 8.7  GFRNONAA 76* 76* 78*  --   --   --   GFRAA 88* 89* >90  --   --   --   PROT  --   --  8.1 9.2* 9.4* 10.1*  ALBUMIN  --   --  2.6* 3.4* 3.6 3.2*  AST  --   --  _1 ALT  --   --  _2 ALKPHOS  --   --  56 79 66 69  BILITOT  --   --  0.4 0.45 0.41 0.40   ASSESSMENT & PLAN:  Myelodysplastic syndrome Overall, his blood counts were stable. The patient has not required any follow-up treatment for his myelodysplastic syndrome. He is not symptomatic from the low blood counts. I recommend observation with history and physical examination twice a year.    Orders Placed This Encounter  Procedures  . CBC with Differential    Standing Status: Future     Number of Occurrences:      Standing Expiration Date: 04/23/2015    All questions were answered. The patient knows to call the clinic with any problems, questions or concerns. I spent 15 minutes counseling the patient face to face. The total time spent in the appointment was 20 minutes and more than 50% was on counseling.     [place], [place], MD 03/20/2014 8:40 AM

## 2014-03-20 NOTE — Assessment & Plan Note (Signed)
Overall, his blood counts were stable. The patient has not required any follow-up treatment for his myelodysplastic syndrome. He is not symptomatic from the low blood counts. I recommend observation with history and physical examination twice a year.

## 2014-04-20 ENCOUNTER — Ambulatory Visit: Payer: Commercial Managed Care - HMO | Admitting: Pharmacist Clinician (PhC)/ Clinical Pharmacy Specialist

## 2014-04-27 ENCOUNTER — Ambulatory Visit (INDEPENDENT_AMBULATORY_CARE_PROVIDER_SITE_OTHER): Payer: Commercial Managed Care - HMO | Admitting: Pharmacist Clinician (PhC)/ Clinical Pharmacy Specialist

## 2014-04-27 DIAGNOSIS — I639 Cerebral infarction, unspecified: Secondary | ICD-10-CM

## 2014-04-27 DIAGNOSIS — Z7901 Long term (current) use of anticoagulants: Secondary | ICD-10-CM

## 2014-04-27 DIAGNOSIS — I4891 Unspecified atrial fibrillation: Secondary | ICD-10-CM

## 2014-04-27 LAB — POCT INR: INR: 2.9

## 2014-05-04 ENCOUNTER — Telehealth: Payer: Self-pay | Admitting: Nurse Practitioner

## 2014-05-04 NOTE — Telephone Encounter (Signed)
I called again.  Got no answer.  No option to leave message.

## 2014-05-04 NOTE — Telephone Encounter (Signed)
I called again.  Got no answer, no option to leave message.

## 2014-05-04 NOTE — Telephone Encounter (Signed)
Our records show patient takes one tablet every 12 hours (two daily).  I called the pharmacy and they said the Rx was filled for one twice daily, and it was picked up on 02/26.  I called back, phone rang over 20 times with no answer, no voicemail.

## 2014-05-04 NOTE — Telephone Encounter (Signed)
Pt's friend is calling stating that [facility] will not pay for Rx for levETIRAcetam (KEPPRA) 500 MG tablet.  [employer] is stating that they have the Rx as 1 tablet a day.  It should be 2 tabs a day.  Please call and advise.

## 2014-05-05 MED ORDER — LEVETIRACETAM 500 MG PO TABS: 500.0000 mg | ORAL_TABLET | Freq: Two times a day (BID) | ORAL | Status: DC

## 2014-05-05 NOTE — Telephone Encounter (Signed)
I called back.  She said the patient picked up a one month supply from local pharmacy with directions of one twice daily as prescribed.  They would like a new Rx sent to [facility] mail order with these instructions.  Member ID # [id number].  Rx has been sent.  They are aware.

## 2014-05-05 NOTE — Telephone Encounter (Signed)
Spouse returning [place], [place] call.  Answering machine was turned off by grand child.  Please return call to # [phone].

## 2014-05-26 ENCOUNTER — Telehealth: Payer: Self-pay | Admitting: *Deleted

## 2014-05-26 NOTE — Telephone Encounter (Signed)
Faxed and signed per Dr Ellyn Hack, okay to discontinue coumadin  For 5-7 days prior to [place] by Dr Clydell Hakim

## 2014-06-01 ENCOUNTER — Encounter: Payer: Self-pay | Admitting: Podiatry

## 2014-06-01 ENCOUNTER — Ambulatory Visit (INDEPENDENT_AMBULATORY_CARE_PROVIDER_SITE_OTHER): Payer: Commercial Managed Care - HMO | Admitting: Podiatry

## 2014-06-01 DIAGNOSIS — B351 Tinea unguium: Secondary | ICD-10-CM

## 2014-06-01 DIAGNOSIS — M79676 Pain in unspecified toe(s): Secondary | ICD-10-CM | POA: Diagnosis not present

## 2014-06-01 NOTE — Patient Instructions (Signed)
Diabetes and Foot Care Diabetes may cause you to have problems because of poor blood supply (circulation) to your feet and legs. This may cause the skin on your feet to become thinner, break easier, and heal more slowly. Your skin may become dry, and the skin may peel and crack. You may also have nerve damage in your legs and feet causing decreased feeling in them. You may not notice minor injuries to your feet that could lead to infections or more serious problems. Taking care of your feet is one of the most important things you can do for yourself.  HOME CARE INSTRUCTIONS  Wear shoes at all times, even in the house. Do not go barefoot. Bare feet are easily injured.  Check your feet daily for blisters, cuts, and redness. If you cannot see the bottom of your feet, use a mirror or ask someone for help.  Wash your feet with warm water (do not use hot water) and mild soap. Then pat your feet and the areas between your toes until they are completely dry. Do not soak your feet as this can dry your skin.  Apply a moisturizing lotion or petroleum jelly (that does not contain alcohol and is unscented) to the skin on your feet and to dry, brittle toenails. Do not apply lotion between your toes.  Trim your toenails straight across. Do not dig under them or around the cuticle. File the edges of your nails with an emery board or nail file.  Do not cut corns or calluses or try to remove them with medicine.  Wear clean socks or stockings every day. Make sure they are not too tight. Do not wear knee-high stockings since they may decrease blood flow to your legs.  Wear shoes that fit properly and have enough cushioning. To break in new shoes, wear them for just a few hours a day. This prevents you from injuring your feet. Always look in your shoes before you put them on to be sure there are no objects inside.  Do not cross your legs. This may decrease the blood flow to your feet.  If you find a minor scrape,  cut, or break in the skin on your feet, keep it and the skin around it clean and dry. These areas may be cleansed with mild soap and water. Do not cleanse the area with peroxide, alcohol, or iodine.  When you remove an adhesive bandage, be sure not to damage the skin around it.  If you have a wound, look at it several times a day to make sure it is healing.  Do not use heating pads or hot water bottles. They may burn your skin. If you have lost feeling in your feet or legs, you may not know it is happening until it is too late.  Make sure your health care provider performs a complete foot exam at least annually or more often if you have foot problems. Report any cuts, sores, or bruises to your health care provider immediately. SEEK MEDICAL CARE IF:   You have an injury that is not healing.  You have cuts or breaks in the skin.  You have an ingrown nail.  You notice redness on your legs or feet.  You feel burning or tingling in your legs or feet.  You have pain or cramps in your legs and feet.  Your legs or feet are numb.  Your feet always feel cold. SEEK IMMEDIATE MEDICAL CARE IF:   There is increasing redness,   swelling, or pain in or around a wound.  There is a red line that goes up your leg.  Pus is coming from a wound.  You develop a fever or as directed by your health care provider.  You notice a bad smell coming from an ulcer or wound. Document Released: 02/18/2000 Document Revised: 10/23/2012 Document Reviewed: 07/30/2012 Exit[employer] Patient Information 2015 [employer]. This information is not intended to replace advice given to you by your health care provider. Make sure you discuss any questions you have with your health care provider.  

## 2014-06-02 NOTE — Progress Notes (Signed)
Patient ID: Terry Robertson., male   DOB: 08/20/1928, 81 y.o.   MRN: [id number]  Subjective: This patient presents again complaining of painful toenails and requests nail debridement  Objective: Orientated 3 patient presents with her son who he says is ""girlfriend". The toenails are elongated, hypertrophic, discolored and tender to palpations-10  Assessment: Symptomatic onychomycoses 6-10 Plantar scaling has resolved bilaterally (patient was using over-the-counter Lotrimin cream for greater than 30 days)  Plan: Nails are debrided without any bleeding  Reappoint 3 months

## 2014-06-08 ENCOUNTER — Ambulatory Visit (INDEPENDENT_AMBULATORY_CARE_PROVIDER_SITE_OTHER): Payer: Commercial Managed Care - HMO | Admitting: Pharmacist Clinician (PhC)/ Clinical Pharmacy Specialist

## 2014-06-08 DIAGNOSIS — I4891 Unspecified atrial fibrillation: Secondary | ICD-10-CM | POA: Diagnosis not present

## 2014-06-08 DIAGNOSIS — Z7901 Long term (current) use of anticoagulants: Secondary | ICD-10-CM

## 2014-06-08 DIAGNOSIS — I639 Cerebral infarction, unspecified: Secondary | ICD-10-CM | POA: Diagnosis not present

## 2014-06-08 LAB — POCT INR: INR: 3.8

## 2014-06-22 ENCOUNTER — Telehealth: Payer: Self-pay | Admitting: Cardiology

## 2014-06-22 MED ORDER — ATENOLOL 25 MG PO TABS: 25.0000 mg | ORAL_TABLET | Freq: Every day | ORAL | Status: DC

## 2014-06-22 NOTE — Telephone Encounter (Signed)
Rx(s) sent to pharmacy electronically.  

## 2014-06-22 NOTE — Telephone Encounter (Signed)
°  1. Which medications need to be refilled? Atenolol  2. Which pharmacy is medication to be sent to?[facility] on [address]  3. Do they need a 30 day or 90 day supply? Did not specify  4. Would they like a call back once the medication has been sent to the pharmacy? Yes because he has been out for a few days

## 2014-06-29 ENCOUNTER — Ambulatory Visit (INDEPENDENT_AMBULATORY_CARE_PROVIDER_SITE_OTHER): Payer: Commercial Managed Care - HMO | Admitting: Pharmacist Clinician (PhC)/ Clinical Pharmacy Specialist

## 2014-06-29 DIAGNOSIS — I639 Cerebral infarction, unspecified: Secondary | ICD-10-CM | POA: Diagnosis not present

## 2014-06-29 DIAGNOSIS — I4891 Unspecified atrial fibrillation: Secondary | ICD-10-CM | POA: Diagnosis not present

## 2014-06-29 DIAGNOSIS — Z7901 Long term (current) use of anticoagulants: Secondary | ICD-10-CM | POA: Diagnosis not present

## 2014-06-29 LAB — POCT INR: INR: 3.1

## 2014-07-09 ENCOUNTER — Other Ambulatory Visit: Payer: Self-pay | Admitting: Specialist

## 2014-07-09 DIAGNOSIS — M545 Low back pain: Secondary | ICD-10-CM

## 2014-07-16 ENCOUNTER — Ambulatory Visit
Admission: RE | Admit: 2014-07-16 | Discharge: 2014-07-16 | Disposition: A | Discharge status: Home / Self Care | Payer: Commercial Managed Care - HMO | Source: Ambulatory Visit | Attending: Specialist | Admitting: Specialist

## 2014-07-16 DIAGNOSIS — M545 Low back pain: Secondary | ICD-10-CM

## 2014-07-20 ENCOUNTER — Ambulatory Visit (INDEPENDENT_AMBULATORY_CARE_PROVIDER_SITE_OTHER): Payer: Commercial Managed Care - HMO | Admitting: Pharmacist Clinician (PhC)/ Clinical Pharmacy Specialist

## 2014-07-20 DIAGNOSIS — I4891 Unspecified atrial fibrillation: Secondary | ICD-10-CM

## 2014-07-20 DIAGNOSIS — I639 Cerebral infarction, unspecified: Secondary | ICD-10-CM | POA: Diagnosis not present

## 2014-07-20 DIAGNOSIS — Z7901 Long term (current) use of anticoagulants: Secondary | ICD-10-CM

## 2014-07-20 LAB — POCT INR: INR: 3.1

## 2014-08-09 ENCOUNTER — Inpatient Hospital Stay (HOSPITAL_COMMUNITY)
Admission: EM | Admit: 2014-08-09 | Discharge: 2014-08-14 | DRG: 871 | Disposition: A | Discharge status: Home Health | Payer: Commercial Managed Care - HMO | Attending: Internal Medicine | Admitting: Internal Medicine

## 2014-08-09 ENCOUNTER — Emergency Department (HOSPITAL_COMMUNITY): Payer: Commercial Managed Care - HMO

## 2014-08-09 ENCOUNTER — Encounter (HOSPITAL_COMMUNITY): Payer: Self-pay | Admitting: Emergency Medicine

## 2014-08-09 DIAGNOSIS — A419 Sepsis, unspecified organism: Secondary | ICD-10-CM | POA: Diagnosis present

## 2014-08-09 DIAGNOSIS — I350 Nonrheumatic aortic (valve) stenosis: Secondary | ICD-10-CM | POA: Diagnosis present

## 2014-08-09 DIAGNOSIS — I10 Essential (primary) hypertension: Secondary | ICD-10-CM | POA: Diagnosis present

## 2014-08-09 DIAGNOSIS — I252 Old myocardial infarction: Secondary | ICD-10-CM

## 2014-08-09 DIAGNOSIS — G40909 Epilepsy, unspecified, not intractable, without status epilepticus: Secondary | ICD-10-CM

## 2014-08-09 DIAGNOSIS — Z8249 Family history of ischemic heart disease and other diseases of the circulatory system: Secondary | ICD-10-CM

## 2014-08-09 DIAGNOSIS — I2510 Atherosclerotic heart disease of native coronary artery without angina pectoris: Secondary | ICD-10-CM | POA: Diagnosis present

## 2014-08-09 DIAGNOSIS — R4182 Altered mental status, unspecified: Secondary | ICD-10-CM | POA: Diagnosis present

## 2014-08-09 DIAGNOSIS — G9341 Metabolic encephalopathy: Secondary | ICD-10-CM | POA: Insufficient documentation

## 2014-08-09 DIAGNOSIS — Z9079 Acquired absence of other genital organ(s): Secondary | ICD-10-CM | POA: Diagnosis present

## 2014-08-09 DIAGNOSIS — G4733 Obstructive sleep apnea (adult) (pediatric): Secondary | ICD-10-CM | POA: Diagnosis present

## 2014-08-09 DIAGNOSIS — I519 Heart disease, unspecified: Secondary | ICD-10-CM | POA: Diagnosis not present

## 2014-08-09 DIAGNOSIS — E119 Type 2 diabetes mellitus without complications: Secondary | ICD-10-CM | POA: Diagnosis present

## 2014-08-09 DIAGNOSIS — Z79899 Other long term (current) drug therapy: Secondary | ICD-10-CM

## 2014-08-09 DIAGNOSIS — D696 Thrombocytopenia, unspecified: Secondary | ICD-10-CM | POA: Diagnosis present

## 2014-08-09 DIAGNOSIS — R569 Unspecified convulsions: Secondary | ICD-10-CM | POA: Diagnosis present

## 2014-08-09 DIAGNOSIS — I5032 Chronic diastolic (congestive) heart failure: Secondary | ICD-10-CM | POA: Diagnosis present

## 2014-08-09 DIAGNOSIS — E1159 Type 2 diabetes mellitus with other circulatory complications: Secondary | ICD-10-CM

## 2014-08-09 DIAGNOSIS — E876 Hypokalemia: Secondary | ICD-10-CM | POA: Diagnosis not present

## 2014-08-09 DIAGNOSIS — J189 Pneumonia, unspecified organism: Secondary | ICD-10-CM

## 2014-08-09 DIAGNOSIS — D469 Myelodysplastic syndrome, unspecified: Secondary | ICD-10-CM | POA: Diagnosis present

## 2014-08-09 DIAGNOSIS — Z8673 Personal history of transient ischemic attack (TIA), and cerebral infarction without residual deficits: Secondary | ICD-10-CM | POA: Diagnosis not present

## 2014-08-09 DIAGNOSIS — J90 Pleural effusion, not elsewhere classified: Secondary | ICD-10-CM

## 2014-08-09 DIAGNOSIS — Z87891 Personal history of nicotine dependence: Secondary | ICD-10-CM

## 2014-08-09 DIAGNOSIS — R7881 Bacteremia: Secondary | ICD-10-CM | POA: Diagnosis not present

## 2014-08-09 DIAGNOSIS — N400 Enlarged prostate without lower urinary tract symptoms: Secondary | ICD-10-CM | POA: Diagnosis present

## 2014-08-09 DIAGNOSIS — E785 Hyperlipidemia, unspecified: Secondary | ICD-10-CM | POA: Diagnosis present

## 2014-08-09 DIAGNOSIS — D509 Iron deficiency anemia, unspecified: Secondary | ICD-10-CM | POA: Diagnosis present

## 2014-08-09 DIAGNOSIS — Z7982 Long term (current) use of aspirin: Secondary | ICD-10-CM | POA: Diagnosis not present

## 2014-08-09 DIAGNOSIS — D61818 Other pancytopenia: Secondary | ICD-10-CM | POA: Diagnosis present

## 2014-08-09 DIAGNOSIS — Z7901 Long term (current) use of anticoagulants: Secondary | ICD-10-CM | POA: Insufficient documentation

## 2014-08-09 DIAGNOSIS — Z955 Presence of coronary angioplasty implant and graft: Secondary | ICD-10-CM | POA: Diagnosis not present

## 2014-08-09 DIAGNOSIS — I480 Paroxysmal atrial fibrillation: Secondary | ICD-10-CM | POA: Diagnosis present

## 2014-08-09 DIAGNOSIS — Z6838 Body mass index (BMI) 38.0-38.9, adult: Secondary | ICD-10-CM | POA: Diagnosis not present

## 2014-08-09 DIAGNOSIS — R509 Fever, unspecified: Secondary | ICD-10-CM | POA: Diagnosis not present

## 2014-08-09 DIAGNOSIS — I5189 Other ill-defined heart diseases: Secondary | ICD-10-CM | POA: Diagnosis present

## 2014-08-09 DIAGNOSIS — R0600 Dyspnea, unspecified: Secondary | ICD-10-CM

## 2014-08-09 DIAGNOSIS — IMO0001 Reserved for inherently not codable concepts without codable children: Secondary | ICD-10-CM | POA: Insufficient documentation

## 2014-08-09 LAB — COMPREHENSIVE METABOLIC PANEL
ALT: 12 U/L — AB (ref 17–63)
ANION GAP: 7 (ref 5–15)
AST: 19 U/L (ref 15–41)
Albumin: 2.4 g/dL — ABNORMAL LOW (ref 3.5–5.0)
Alkaline Phosphatase: 56 U/L (ref 38–126)
BUN: 8 mg/dL (ref 6–20)
CHLORIDE: 104 mmol/L (ref 101–111)
CO2: 23 mmol/L (ref 22–32)
Calcium: 8.1 mg/dL — ABNORMAL LOW (ref 8.9–10.3)
Creatinine, Ser: 0.99 mg/dL (ref 0.61–1.24)
Glucose, Bld: 90 mg/dL (ref 65–99)
Potassium: 3.7 mmol/L (ref 3.5–5.1)
Sodium: 134 mmol/L — ABNORMAL LOW (ref 135–145)
Total Bilirubin: 0.4 mg/dL (ref 0.3–1.2)
Total Protein: 10.7 g/dL — ABNORMAL HIGH (ref 6.5–8.1)

## 2014-08-09 LAB — MRSA PCR SCREENING: MRSA by PCR: NEGATIVE

## 2014-08-09 LAB — I-STAT CG4 LACTIC ACID, ED
LACTIC ACID, VENOUS: 1.02 mmol/L (ref 0.5–2.0)
LACTIC ACID, VENOUS: 0.83 mmol/L (ref 0.5–2.0)

## 2014-08-09 LAB — URINE MICROSCOPIC-ADD ON

## 2014-08-09 LAB — CBC WITH DIFFERENTIAL/PLATELET
Basophils Absolute: 0 10*3/uL (ref 0.0–0.1)
Basophils Relative: 0 % (ref 0–1)
EOS PCT: 0 % (ref 0–5)
Eosinophils Absolute: 0 10*3/uL (ref 0.0–0.7)
HEMATOCRIT: 30.6 % — AB (ref 39.0–52.0)
Hemoglobin: 9 g/dL — ABNORMAL LOW (ref 13.0–17.0)
LYMPHS ABS: 0.9 10*3/uL (ref 0.7–4.0)
Lymphocytes Relative: 12 % (ref 12–46)
MCH: 21.7 pg — ABNORMAL LOW (ref 26.0–34.0)
MCHC: 29.4 g/dL — ABNORMAL LOW (ref 30.0–36.0)
MCV: 73.7 fL — ABNORMAL LOW (ref 78.0–100.0)
MONO ABS: 1.9 10*3/uL — AB (ref 0.1–1.0)
Monocytes Relative: 25 % — ABNORMAL HIGH (ref 3–12)
NEUTROS ABS: 4.9 10*3/uL (ref 1.7–7.7)
NEUTROS PCT: 63 % (ref 43–77)
Platelets: 111 10*3/uL — ABNORMAL LOW (ref 150–400)
RBC: 4.15 MIL/uL — ABNORMAL LOW (ref 4.22–5.81)
RDW: 20.9 % — ABNORMAL HIGH (ref 11.5–15.5)
WBC: 7.7 10*3/uL (ref 4.0–10.5)

## 2014-08-09 LAB — VITAMIN B12: Vitamin B-12: 493 pg/mL (ref 180–914)

## 2014-08-09 LAB — BRAIN NATRIURETIC PEPTIDE: B Natriuretic Peptide: 154.8 pg/mL — ABNORMAL HIGH (ref 0.0–100.0)

## 2014-08-09 LAB — PROTIME-INR
INR: 2.69 — ABNORMAL HIGH (ref 0.00–1.49)
Prothrombin Time: 28.2 seconds — ABNORMAL HIGH (ref 11.6–15.2)

## 2014-08-09 LAB — URINALYSIS, ROUTINE W REFLEX MICROSCOPIC
BILIRUBIN URINE: NEGATIVE
Glucose, UA: NEGATIVE mg/dL
KETONES UR: NEGATIVE mg/dL
LEUKOCYTES UA: NEGATIVE
Nitrite: NEGATIVE
PH: 7.5 (ref 5.0–8.0)
Protein, ur: >300 mg/dL — AB
Specific Gravity, Urine: 1.017 (ref 1.005–1.030)
Urobilinogen, UA: 1 mg/dL (ref 0.0–1.0)

## 2014-08-09 LAB — RETICULOCYTES
RBC.: 4.16 MIL/uL — ABNORMAL LOW (ref 4.22–5.81)
RETIC CT PCT: 1 % (ref 0.4–3.1)
Retic Count, Absolute: 41.6 10*3/uL (ref 19.0–186.0)

## 2014-08-09 LAB — CBG MONITORING, ED: GLUCOSE-CAPILLARY: 85 mg/dL (ref 65–99)

## 2014-08-09 MED ORDER — SODIUM CHLORIDE 0.9 % IV BOLUS (SEPSIS)
500.0000 mL | INTRAVENOUS | Status: AC
  Administered 2014-08-09: 500 mL via INTRAVENOUS

## 2014-08-09 MED ORDER — BUDESONIDE 0.25 MG/2ML IN SUSP
0.2500 mg | Freq: Two times a day (BID) | RESPIRATORY_TRACT | Status: DC
  Administered 2014-08-10 – 2014-08-14 (×7): 0.25 mg via RESPIRATORY_TRACT
  Filled 2014-08-09 (×14): qty 2

## 2014-08-09 MED ORDER — ASPIRIN EC 81 MG PO TBEC
81.0000 mg | DELAYED_RELEASE_TABLET | Freq: Every day | ORAL | Status: DC
  Administered 2014-08-09 – 2014-08-14 (×6): 81 mg via ORAL
  Filled 2014-08-09 (×6): qty 1

## 2014-08-09 MED ORDER — ACETAMINOPHEN 325 MG PO TABS
650.0000 mg | ORAL_TABLET | Freq: Once | ORAL | Status: AC
  Administered 2014-08-09: 650 mg via ORAL
  Filled 2014-08-09: qty 2

## 2014-08-09 MED ORDER — ONDANSETRON HCL 4 MG PO TABS: 4.0000 mg | ORAL_TABLET | Freq: Four times a day (QID) | ORAL | Status: DC | PRN

## 2014-08-09 MED ORDER — SODIUM CHLORIDE 0.9 % IV BOLUS (SEPSIS)
1000.0000 mL | INTRAVENOUS | Status: AC
  Administered 2014-08-09 (×2): 1000 mL via INTRAVENOUS

## 2014-08-09 MED ORDER — SODIUM CHLORIDE 0.9 % IJ SOLN
3.0000 mL | Freq: Two times a day (BID) | INTRAMUSCULAR | Status: DC
  Administered 2014-08-09 – 2014-08-14 (×10): 3 mL via INTRAVENOUS

## 2014-08-09 MED ORDER — HYDROCODONE-ACETAMINOPHEN 5-325 MG PO TABS
1.0000 | ORAL_TABLET | ORAL | Status: DC | PRN
  Administered 2014-08-11: 1 via ORAL
  Filled 2014-08-09 (×2): qty 1

## 2014-08-09 MED ORDER — MIRABEGRON ER 50 MG PO TB24
50.0000 mg | ORAL_TABLET | Freq: Every day | ORAL | Status: DC
  Administered 2014-08-09 – 2014-08-14 (×6): 50 mg via ORAL
  Filled 2014-08-09 (×6): qty 1

## 2014-08-09 MED ORDER — PIPERACILLIN-TAZOBACTAM 3.375 G IVPB 30 MIN
3.3750 g | Freq: Once | INTRAVENOUS | Status: AC
  Administered 2014-08-09: 3.375 g via INTRAVENOUS
  Filled 2014-08-09: qty 50

## 2014-08-09 MED ORDER — ALBUTEROL SULFATE HFA 108 (90 BASE) MCG/ACT IN AERS: 2.0000 | INHALATION_SPRAY | Freq: Four times a day (QID) | RESPIRATORY_TRACT | Status: DC | PRN

## 2014-08-09 MED ORDER — PIPERACILLIN-TAZOBACTAM 3.375 G IVPB
3.3750 g | Freq: Three times a day (TID) | INTRAVENOUS | Status: DC
  Administered 2014-08-09 – 2014-08-12 (×8): 3.375 g via INTRAVENOUS
  Filled 2014-08-09 (×10): qty 50

## 2014-08-09 MED ORDER — DILTIAZEM HCL ER COATED BEADS 180 MG PO CP24
180.0000 mg | ORAL_CAPSULE | Freq: Every day | ORAL | Status: DC
  Administered 2014-08-09 – 2014-08-14 (×6): 180 mg via ORAL
  Filled 2014-08-09 (×6): qty 1

## 2014-08-09 MED ORDER — LEVETIRACETAM 500 MG PO TABS
500.0000 mg | ORAL_TABLET | Freq: Two times a day (BID) | ORAL | Status: DC
  Administered 2014-08-09 – 2014-08-14 (×10): 500 mg via ORAL
  Filled 2014-08-09 (×14): qty 1

## 2014-08-09 MED ORDER — IPRATROPIUM-ALBUTEROL 0.5-2.5 (3) MG/3ML IN SOLN: 3.0000 mL | RESPIRATORY_TRACT | Status: DC | PRN

## 2014-08-09 MED ORDER — VANCOMYCIN HCL IN DEXTROSE 750-5 MG/150ML-% IV SOLN
750.0000 mg | Freq: Two times a day (BID) | INTRAVENOUS | Status: DC
  Administered 2014-08-10 – 2014-08-11 (×5): 750 mg via INTRAVENOUS
  Filled 2014-08-09 (×6): qty 150

## 2014-08-09 MED ORDER — ATORVASTATIN CALCIUM 20 MG PO TABS
20.0000 mg | ORAL_TABLET | Freq: Every day | ORAL | Status: DC
  Administered 2014-08-09 – 2014-08-13 (×6): 20 mg via ORAL
  Filled 2014-08-09 (×6): qty 1

## 2014-08-09 MED ORDER — VANCOMYCIN HCL IN DEXTROSE 1-5 GM/200ML-% IV SOLN
1000.0000 mg | Freq: Once | INTRAVENOUS | Status: AC
  Administered 2014-08-09: 1000 mg via INTRAVENOUS
  Filled 2014-08-09: qty 200

## 2014-08-09 MED ORDER — DOCUSATE SODIUM 100 MG PO CAPS
100.0000 mg | ORAL_CAPSULE | Freq: Two times a day (BID) | ORAL | Status: DC
  Administered 2014-08-09 – 2014-08-14 (×9): 100 mg via ORAL
  Filled 2014-08-09 (×13): qty 1

## 2014-08-09 MED ORDER — SODIUM CHLORIDE 0.9 % IV SOLN
250.0000 mL | INTRAVENOUS | Status: DC | PRN
Start: 2014-08-09 — End: 2014-08-14

## 2014-08-09 MED ORDER — GUAIFENESIN ER 600 MG PO TB12
1200.0000 mg | ORAL_TABLET | Freq: Two times a day (BID) | ORAL | Status: DC
  Administered 2014-08-09 – 2014-08-14 (×10): 1200 mg via ORAL
  Filled 2014-08-09 (×11): qty 2

## 2014-08-09 MED ORDER — SODIUM CHLORIDE 0.9 % IV BOLUS (SEPSIS)
1000.0000 mL | Freq: Once | INTRAVENOUS | Status: AC
  Administered 2014-08-09: 1000 mL via INTRAVENOUS

## 2014-08-09 MED ORDER — SODIUM CHLORIDE 0.9 % IJ SOLN
3.0000 mL | Freq: Two times a day (BID) | INTRAMUSCULAR | Status: DC
  Administered 2014-08-09 – 2014-08-11 (×4): 3 mL via INTRAVENOUS

## 2014-08-09 MED ORDER — WARFARIN - PHARMACIST DOSING INPATIENT: Freq: Every day | Status: DC

## 2014-08-09 MED ORDER — ALBUTEROL SULFATE (2.5 MG/3ML) 0.083% IN NEBU: 2.5000 mg | INHALATION_SOLUTION | Freq: Four times a day (QID) | RESPIRATORY_TRACT | Status: DC | PRN

## 2014-08-09 MED ORDER — SODIUM CHLORIDE 0.9 % IJ SOLN: 3.0000 mL | INTRAMUSCULAR | Status: DC | PRN

## 2014-08-09 MED ORDER — WARFARIN SODIUM 7.5 MG PO TABS
7.5000 mg | ORAL_TABLET | Freq: Once | ORAL | Status: AC
  Administered 2014-08-09: 7.5 mg via ORAL
  Filled 2014-08-09: qty 1

## 2014-08-09 MED ORDER — BISACODYL 5 MG PO TBEC
5.0000 mg | DELAYED_RELEASE_TABLET | Freq: Every day | ORAL | Status: DC | PRN
  Filled 2014-08-09: qty 1

## 2014-08-09 MED ORDER — ATENOLOL 25 MG PO TABS
25.0000 mg | ORAL_TABLET | Freq: Every day | ORAL | Status: DC
  Administered 2014-08-09 – 2014-08-14 (×6): 25 mg via ORAL
  Filled 2014-08-09 (×6): qty 1

## 2014-08-09 MED ORDER — ONDANSETRON HCL 4 MG/2ML IJ SOLN: 4.0000 mg | Freq: Four times a day (QID) | INTRAMUSCULAR | Status: DC | PRN

## 2014-08-09 MED ORDER — ALUM & MAG HYDROXIDE-SIMETH 200-200-20 MG/5ML PO SUSP
30.0000 mL | Freq: Four times a day (QID) | ORAL | Status: DC | PRN
Start: 2014-08-09 — End: 2014-08-14

## 2014-08-09 MED ORDER — LATANOPROST 0.005 % OP SOLN
1.0000 [drp] | Freq: Every day | OPHTHALMIC | Status: DC
  Administered 2014-08-09 – 2014-08-13 (×4): 1 [drp] via OPHTHALMIC
  Filled 2014-08-09 (×2): qty 2.5

## 2014-08-09 NOTE — ED Provider Notes (Signed)
CSN: [id number]     Arrival date & time 08/09/14  1045 History   First MD Initiated Contact with Patient 08/09/14 1045     Chief Complaint  Patient presents with  . Code Sepsis  . Altered Mental Status   Level V caveat secondary to confusion.  (Consider location/radiation/quality/duration/timing/severity/associated sxs/prior Treatment) Patient is a 81 y.o. male presenting with altered mental status.  Altered Mental Status   81 year old male with history of stroke, diabetes, and hypertension who presents today with generalized weakness, altered mental status consisting of some confusion/difficulty answering questions, and fever to 103. Girlfriend who is in contact with him daily, and son, reports that he had been well until 2 days ago yesterday. Yesterday he was sleeping more than usual but was up and around. They had not noticed any cough, nausea, vomiting, or diarrhea. Today his son that lives in the home gave him some food. He was too weak to come and eat that although he had been walking earlier in the morning. They report a previous stroke. They report that he has had seizure since that time. No seizure activity or new lateralized weakness was noted.  Past Medical History  Diagnosis Date  . Diabetes mellitus   . Hypertension   . Stroke 08/2011, 03/2012    L MCA (Afib RVR while admitted for hypotension) - expressive aphasia (almost totally recovered)  . Seizures     Post CVA; on Keppra  . OSA on CPAP   . Paroxysmal atrial fibrillation     on coumadin; Diagnosed at the time of his 1st CVA  . Obesity, morbid     BMI  . Heart attack 1997    inferior MI with left circumflex stent  . CAD S/P percutaneous coronary angioplasty 1997    status post remote PCI to the LAD and circumflex in 1997, heart cath in 2008 showed widely patent stents in the circumflex and LAD.  RCA had a mid lesion and then was totally occluded distally.  . Myelodysplastic syndrome     w/mild anemia an neutropenia  .  BPH (benign prostatic hyperplasia)     TURP in 12/2010  . CHF (congestive heart failure)   . Asbestosis     family unclear where he was evaluated in the past   Past Surgical History  Procedure Laterality Date  . Transthoracic echocardiogram  08/06/11    EF 55% to 65% with grade2 diastolic dysfunction/pseudonormal with mildly dilated left atrium, had evidence of elevated filling ressures in the left vientricle and left atrium  . Cardiac catheterization  02/08/07    widely patent stent in the circumflex and LAD.  RCA had a mid lesion and then was totally occluded distally. Cook bare-metal 3.5x20 mm stent  . Transurethral resection of prostate  12/2010  . Nm myoview ltd  01/09/1999    Inferior wall thinning with possible mild ischemia versus infarct. This would correlate well with his known RCA occlusion   Family History  Problem Relation Age of Onset  . Heart disease Mother   . Heart attack Father 29   History  Substance Use Topics  . Smoking status: Former Smoker    Types: Cigarettes    Quit date: 03/08/1988  . Smokeless tobacco: Never Used  . Alcohol Use: No    Review of Systems  Unable to perform ROS     Allergies  Review of patient's allergies indicates no known allergies.  Home Medications   Prior to Admission medications   Medication Sig  Start Date End Date Taking? Authorizing Provider  albuterol (PROVENTIL HFA;VENTOLIN HFA) 108 (90 BASE) MCG/ACT inhaler Inhale 2 puffs into the lungs every 6 (six) hours as needed for wheezing or shortness of breath. 06/28/13   Ripudeep Krystal Eaton, MD  aspirin EC 81 MG tablet Take 81 mg by mouth daily.    Historical Provider, MD  atenolol (TENORMIN) 25 MG tablet Take 1 tablet (25 mg total) by mouth daily. 06/22/14   Leonie Man, MD  atorvastatin (LIPITOR) 20 MG tablet Take 20 mg by mouth daily. 08/11/13   Historical Provider, MD  bisacodyl (DULCOLAX) 5 MG EC tablet Take 5 mg by mouth daily as needed for moderate constipation.    Historical  Provider, MD  CARTIA XT 180 MG 24 hr capsule  12/29/13   Historical Provider, MD  diltiazem (DILACOR XR) 180 MG 24 hr capsule Take 180 mg by mouth daily.    Historical Provider, MD  fish oil-omega-3 fatty acids 1000 MG capsule Take 1 g by mouth daily.    Historical Provider, MD  furosemide (LASIX) 20 MG tablet Take 1 tablet (20 mg total) by mouth daily with breakfast. 03/29/12   Ivan Anchors Love, PA-C  gabapentin (NEURONTIN) 100 MG capsule Take 100 mg by mouth daily. 11/23/13   Historical Provider, MD  HYDROcodone-acetaminophen (NORCO/VICODIN) 5-325 MG per tablet Take 1 tablet by mouth every 4 (four) hours as needed for moderate pain.    Historical Provider, MD  Icosapent Ethyl (VASCEPA) 1 G CAPS Take 1 g by mouth daily.    Historical Provider, MD  latanoprost (XALATAN) 0.005 % ophthalmic solution Use as directed 02/17/14   Historical Provider, MD  levETIRAcetam (KEPPRA) 500 MG tablet Take 1 tablet (500 mg total) by mouth every 12 (twelve) hours. 05/05/14   Marcial Pacas, MD  MYRBETRIQ 50 MG TB24 tablet Take 50 mg by mouth daily. 07/06/14   Historical Provider, MD  Vitamin D, Ergocalciferol, (DRISDOL) 50000 UNITS CAPS Take 50,000 Units by mouth 2 (two) times a week. Takes 1 capsule on Wednesday and Saturday every week    Historical Provider, MD  warfarin (COUMADIN) 5 MG tablet Take 1 tablet (5 mg total) by mouth daily. 12/25/13   Leonie Man, MD   BP 171/82 mmHg  Pulse 114  Temp(Src) 103.1 F (39.5 C) (Oral)  Resp 34  SpO2 92% Physical Exam  Constitutional: He appears well-developed and well-nourished.  Obese  HENT:  Head: Normocephalic and atraumatic.  Eyes: Conjunctivae are normal. Pupils are equal, round, and reactive to light.  Neck: Normal range of motion. Neck supple.  Cardiovascular: Tachycardia present.   Pulmonary/Chest: He has rhonchi in the right lower field and the left lower field.  Abdominal: Soft. Bowel sounds are normal. He exhibits no distension. There is no tenderness.   Musculoskeletal: Normal range of motion.  Trace edema bilaterally, no deformity noted, no redness of skin or ulcers noted.  Neurological: He is alert.  Patient is alert and oriented to person but not place or date.  Skin: Skin is warm and dry.  Nursing note and vitals reviewed.   ED Course  Procedures (including critical care time) Labs Review Labs Reviewed  COMPREHENSIVE METABOLIC PANEL - Abnormal; Notable for the following:    Sodium 134 (*)    Calcium 8.1 (*)    Total Protein 10.7 (*)    Albumin 2.4 (*)    ALT 12 (*)    All other components within normal limits  CBC WITH DIFFERENTIAL/PLATELET - Abnormal; Notable  for the following:    RBC 4.15 (*)    Hemoglobin 9.0 (*)    HCT 30.6 (*)    MCV 73.7 (*)    MCH 21.7 (*)    MCHC 29.4 (*)    RDW 20.9 (*)    Platelets 111 (*)    Monocytes Relative 25 (*)    Monocytes Absolute 1.9 (*)    All other components within normal limits  PROTIME-INR - Abnormal; Notable for the following:    Prothrombin Time 28.2 (*)    INR 2.69 (*)    All other components within normal limits  CULTURE, BLOOD (ROUTINE X 2)  CULTURE, BLOOD (ROUTINE X 2)  URINE CULTURE  URINALYSIS, ROUTINE W REFLEX MICROSCOPIC (NOT AT [facility])  I-STAT CG4 LACTIC ACID, ED  CBG MONITORING, ED    Imaging Review Dg Chest Portable 1 View  08/09/2014   CLINICAL DATA:  Altered mental status.  Prior stroke.  EXAM: PORTABLE CHEST - 1 VIEW  COMPARISON:  02/02/2014  FINDINGS: Normal mediastinum with enlarged cardiac silhouette. There is left pleural effusion. The left base is poorly evaluated. Central venous pulmonary congestion noted.  IMPRESSION: Left pleural effusion and atelectasis. Cannot exclude left lower lobe pneumonia.   Electronically Signed   By: Suzy Bouchard M.D.   On: 08/09/2014 12:04     EKG Interpretation   Date/Time:  Sunday August 09 2014 10:51:52 EDT Ventricular Rate:  115 PR Interval:  175 QRS Duration: 106 QT Interval:  336 QTC Calculation: 465 R  Axis:   8 Text Interpretation:  Sinus tachycardia Premature ventricular complexes  Poor R wave progression Non-specific ST-t changes SINCE LAST TRACING HEART  RATE HAS INCREASED Confirmed by Rennie Rouch MD, Andee Poles ([id number]) on 08/09/2014  11:27:45 AM      MDM   Final diagnoses:  None    81 year old male with multiple health problems who presents today with fever to 103.1. Patient has had blood cultures done, chest x-Ailish Prospero with a possible left lower lobe pneumonia, and has received IV and bodyaches. He has remained hemodynamically stable with hypertension and mild tachycardia. He has received IV hydration. First lactic acid is normal. He is somewhat slow to respond but according to his significant other this is at baseline for him since his stroke.  1 fever, -patient with probable pneumonia on chest x-Tevita Gomer. He appears to be oxygenating well. Received IV antibody here. Lactic acid is normal and patient has maintained good blood pressures. There was some initial concern for sepsis as he was reported to have altered mental status. Here he does not appear to be different from his baseline per the family. 2 anticoagulation patient has INR of 2.4. Family is unclear as to why he is on Coumadin but review of records shows paroxysmal atrial fibrillation as likely reason for anticoagulation. 3 chronic stable Anemia hemoglobin here is 9 last was 10   Discussed with Ms. York and she will see and admit.   Pattricia Boss, MD 08/09/14 [phone]

## 2014-08-09 NOTE — H&P (Signed)
[facility] History and Physical                                                                                    Terry Robertson, is a 81 y.o. male  MRN: [id number]   DOB - 02/28/1929  Admit Date - 08/09/2014  Outpatient Primary MD for the patient is [name], MD Hematologist:  Dr. Alvy Bimler  Referring Physician:  Dr. Jeanell Sparrow   Chief Complaint:   Chief Complaint  Patient presents with  . Code Sepsis  . Altered Mental Status     HPI  Terry Robertson  is a 81 y.o. male, with a past medical history of CVA, seizures, myelodysplastic syndrome, MI, pneumothorax, CHF, asbestosis, diabetes and obstructive sleep apnea. He presents to the emergency department today with a temperature of 103.1, confusion, and likely left lower lobe pneumonia.  The patient is unable to give much history as he has confusion and a deficit from previous stroke. Since his previous stroke he has been slow to answer questions, and often stutters.  His girlfriend is at bedside. She reports that he was feeling well 2 days ago but became sleepier than normal. Yesterday he slept most of the day. This morning when he woke he felt very weak he got out of bed but was too weak to eat. His son called 64. He was brought to the ER and found to have a temperature of 103.1, respirations of 34 and a pulse rate of 114.  His lactic acid is not elevated.  His hemoglobin is currently 9.0 with an MCV value of 73.7. His baseline hemoglobin appears to be approximately 11.  Review of Systems   In addition to the HPI above, the patient reports no complaints. He is confused. He reports that his bowel movements have been normal. He denies pain with urination. He denies chest pain and difficulty breathing. He denies headache. His appetite has decreased.   Past Medical History  Past Medical History  Diagnosis Date  . Diabetes mellitus   . Hypertension   . Stroke 08/2011, 03/2012    L MCA (Afib RVR while admitted for hypotension) - expressive  aphasia (almost totally recovered)  . Seizures     Post CVA; on Keppra  . OSA on CPAP   . Paroxysmal atrial fibrillation     on coumadin; Diagnosed at the time of his 1st CVA  . Obesity, morbid     BMI  . Heart attack 1997    inferior MI with left circumflex stent  . CAD S/P percutaneous coronary angioplasty 1997    status post remote PCI to the LAD and circumflex in 1997, heart cath in 2008 showed widely patent stents in the circumflex and LAD.  RCA had a mid lesion and then was totally occluded distally.  . Myelodysplastic syndrome     w/mild anemia an neutropenia  . BPH (benign prostatic hyperplasia)     TURP in 12/2010  . CHF (congestive heart failure)   . Asbestosis     family unclear where he was evaluated in the past    Past Surgical History  Procedure Laterality Date  . Transthoracic echocardiogram  08/06/11  EF 55% to 65% with grade2 diastolic dysfunction/pseudonormal with mildly dilated left atrium, had evidence of elevated filling ressures in the left vientricle and left atrium  . Cardiac catheterization  02/08/07    widely patent stent in the circumflex and LAD.  RCA had a mid lesion and then was totally occluded distally. Cook bare-metal 3.5x20 mm stent  . Transurethral resection of prostate  12/2010  . Nm myoview ltd  01/09/1999    Inferior wall thinning with possible mild ischemia versus infarct. This would correlate well with his known RCA occlusion      Social History History  Substance Use Topics  . Smoking status: Former Smoker    Types: Cigarettes    Quit date: 03/08/1988  . Smokeless tobacco: Never Used  . Alcohol Use: No    Family History Family History  Problem Relation Age of Onset  . Heart disease Mother   . Heart attack Father 39    Prior to Admission medications   Medication Sig Start Date End Date Taking? Authorizing Provider  albuterol (PROVENTIL HFA;VENTOLIN HFA) 108 (90 BASE) MCG/ACT inhaler Inhale 2 puffs into the lungs every 6 (six)  hours as needed for wheezing or shortness of breath. 06/28/13   Ripudeep Krystal Eaton, MD  aspirin EC 81 MG tablet Take 81 mg by mouth daily.    Historical Provider, MD  atenolol (TENORMIN) 25 MG tablet Take 1 tablet (25 mg total) by mouth daily. 06/22/14   Leonie Man, MD  atorvastatin (LIPITOR) 20 MG tablet Take 20 mg by mouth daily. 08/11/13   Historical Provider, MD  bisacodyl (DULCOLAX) 5 MG EC tablet Take 5 mg by mouth daily as needed for moderate constipation.    Historical Provider, MD  CARTIA XT 180 MG 24 hr capsule  12/29/13   Historical Provider, MD  gabapentin (NEURONTIN) 100 MG capsule Take 100 mg by mouth daily. 11/23/13   Historical Provider, MD  HYDROcodone-acetaminophen (NORCO/VICODIN) 5-325 MG per tablet Take 1 tablet by mouth every 4 (four) hours as needed for moderate pain.    Historical Provider, MD  latanoprost (XALATAN) 0.005 % ophthalmic solution Use as directed 02/17/14   Historical Provider, MD  levETIRAcetam (KEPPRA) 500 MG tablet Take 1 tablet (500 mg total) by mouth every 12 (twelve) hours. 05/05/14   Marcial Pacas, MD  MYRBETRIQ 50 MG TB24 tablet Take 50 mg by mouth daily. 07/06/14   Historical Provider, MD  Vitamin D, Ergocalciferol, (DRISDOL) 50000 UNITS CAPS Take 50,000 Units by mouth 2 (two) times a week. Takes 1 capsule on Wednesday and Saturday every week    Historical Provider, MD  warfarin (COUMADIN) 5 MG tablet Take 1 tablet (5 mg total) by mouth daily. 12/25/13   Leonie Man, MD    No Known Allergies  Physical Exam  Vitals  Blood pressure 142/71, pulse 102, temperature 100 F (37.8 C), temperature source Oral, resp. rate 31, height 5\' 5"  (1.651 m), weight 112.492 kg (248 lb), SpO2 97 %.   General: Well-developed, well-nourished, male lying in bed quietly. Girlfriend at bedside.  Psych:  Flat affect. Cooperative. Slow to understand what I am saying. Slow to answer.  Neuro:   Strength 5/5 all 4 extremities, Sensation intact all 4 extremities. Patient is awake  but acts Lethargic.  ENT:  Ears and Eyes appear Normal, Conjunctivae clear, PER. Moist oral mucosa without erythema or exudates.  Neck:  Supple, No lymphadenopathy appreciated  Respiratory:  Symmetrical chest wall movement, Good air movement bilaterally, CTAB.  Cardiac:  RRR, positive 3/6 systolic murmur, 1-2 lower extremity edema bilaterally, no JVD.    Abdomen:  Positive bowel sounds, Soft, Non tender, Non distended,  No masses appreciated  Skin:  No Cyanosis, Normal Skin Turgor, No Skin Rash or Bruise.  Extremities:  Able to move all 4. 5/5 strength in each,  no effusions.  Data Review  CBC  Recent Labs Lab 08/09/14 1118  WBC 7.7  HGB 9.0*  HCT 30.6*  PLT 111*  MCV 73.7*  MCH 21.7*  MCHC 29.4*  RDW 20.9*  LYMPHSABS 0.9  MONOABS 1.9*  EOSABS 0.0  BASOSABS 0.0    Chemistries   Recent Labs Lab 08/09/14 1118  NA 134*  K 3.7  CL 104  CO2 23  GLUCOSE 90  BUN 8  CREATININE 0.99  CALCIUM 8.1*  AST 19  ALT 12*  ALKPHOS 56  BILITOT 0.4    Coagulation profile  Recent Labs Lab 08/09/14 1118  INR 2.69*     Urinalysis    Component Value Date/Time   COLORURINE YELLOW 08/09/2014 1115   APPEARANCEUR CLEAR 08/09/2014 1115   LABSPEC 1.017 08/09/2014 1115   PHURINE 7.5 08/09/2014 1115   GLUCOSEU NEGATIVE 08/09/2014 1115   HGBUR MODERATE* 08/09/2014 1115   BILIRUBINUR NEGATIVE 08/09/2014 1115   KETONESUR NEGATIVE 08/09/2014 1115   PROTEINUR >300* 08/09/2014 1115   UROBILINOGEN 1.0 08/09/2014 1115   NITRITE NEGATIVE 08/09/2014 1115   LEUKOCYTESUR NEGATIVE 08/09/2014 1115    Imaging results:   Ct Lumbar Spine Wo Contrast  07/16/2014   CLINICAL DATA:  Low back pain.  Steel beam fell on patient 19.  EXAM: CT LUMBAR SPINE WITHOUT CONTRAST  TECHNIQUE: Multidetector CT imaging of the lumbar spine was performed without intravenous contrast administration. Multiplanar CT image reconstructions were also generated.  COMPARISON:  None.  FINDINGS: There is  grade 1 anterolisthesis of L4 on L5 secondary to facet disease. The vertebral body heights are maintained. There is no acute fracture or static listhesis. The paravertebral soft tissues are normal. The intraspinal soft tissues are not fully imaged on this examination due to poor soft tissue contrast, but there is no gross soft tissue abnormality. The disc spaces are maintained.  There are mild degenerative changes of bilateral SI joints.  There is a small left pleural effusion. There is abdominal aortic atherosclerosis partially visualized.  There is severe degenerative disc disease with disc space narrowing at L3-4, L4-5 and L5-S1.  T11-12: No significant disc bulge or foraminal stenosis. Mild bilateral facet arthropathy.  T12-L1: Mild broad-based disc osteophyte complex. No foraminal or central canal stenosis.  L1-2: Mild broad-based disc osteophyte complex. Mild bilateral facet arthropathy. No foraminal or central canal stenosis.  L2-3: Mild broad-based disc bulge. Mild bilateral facet arthropathy. No foraminal or central canal stenosis.  L3-4: Mild broad-based disc osteophyte complex. Severe bilateral facet arthropathy. Mild left foraminal narrowing.  L4-5: A mild broad-based disc bulge. Severe bilateral facet arthropathy. Mild left foraminal stenosis. Moderate right foraminal stenosis. Mild spinal stenosis.  L5-S1: Mild broad-based disc osteophyte complex. Moderate bilateral facet arthropathy scratch S severe bilateral facet arthropathy. Moderate bilateral foraminal stenosis.  IMPRESSION: 1. Diffuse lumbar spine spondylosis most severe at L4-5 and L5-S1.   Electronically Signed   By: Kathreen Devoid   On: 07/16/2014 14:58   Dg Chest Portable 1 View  08/09/2014   CLINICAL DATA:  Altered mental status.  Prior stroke.  EXAM: PORTABLE CHEST - 1 VIEW  COMPARISON:  02/02/2014  FINDINGS: Normal mediastinum with enlarged cardiac  silhouette. There is left pleural effusion. The left base is poorly evaluated. Central  venous pulmonary congestion noted.  IMPRESSION: Left pleural effusion and atelectasis. Cannot exclude left lower lobe pneumonia.   Electronically Signed   By: Suzy Bouchard M.D.   On: 08/09/2014 12:04    My personal review of EKG: Sinus tach with occasional PVCs, QT is not prolonged.   Assessment & Plan  Principal Problem:   Sepsis Active Problems:   Myelodysplastic syndrome   Dyslipidemia, goal LDL below 70   Essential hypertension   DM type 2 (diabetes mellitus, type 2)   Diastolic dysfunction: Grade 2   Thrombocytopenia, chronic   PAF (paroxysmal atrial fibrillation)   CAP (community acquired pneumonia)   Left ventricular diastolic dysfunction, NYHA class 2 (grade 2 dysfunction with elevated filling pressures)   Altered mental status   Microcytic anemia   Sepsis Thought to be secondary to left lower lobe pneumonia with pleural effusion. Vanco and Zosyn per pharmacy. Blood cultures are pending. Patient received 2.5 L of fluid in the emergency department. Given his diastolic dysfunction would not continue aggressive hydration.  Lactic acid is normal.  Possible left lower lobe pneumonia with pleural effusion. Follow-up chest x-ray 6/6 AM. Placed on Vanco and Zosyn for sepsis/pneumonia. Blood cultures, sputum cultures, urine for Legionella and strep pneumo antigen, HIV are pending. Mucinex twice a day and DuoNebs when necessary are ordered  Altered mental status Likely due to acute infection in the setting of chronic deficit from previous stroke.  Diastolic dysfunction Noted is grade 2 on a 2013 2-D echo. Murmur auscultated on exam. I am uncertain whether or not this is new. Will recheck 2-D echo. The patient is normally on Lasix at home which I am holding due to possible sepsis. Please reevaluate volume status on 6/6.   paroxysmal atrial fibrillation   on chronic Coumadin, atenolol, diltiazem - will continue all of these.   History of coronary artery disease status  post stent placement. Continue aspirin in addition to Coumadin.  Microcytic anemia and thrombocytopenia It appears to have developed slowly over the course of the past year. The patient sees Dr. Alvy Bimler for myelodysplastic syndrome. So I will not order an anemia panel. I will check his stool for occult blood as he is on Coumadin.   Of note he has hemoglobinuria and proteinuria  Diabetes mellitus Patient carries this diagnosis however he does not appear to be on any medications at home and his glucose in the ER is 90.   Consultants Called:  none  Family Communication:    girlfriend/significant other at bedside   Code Status:   full code   Condition:  guarded   Potential Disposition: To home in approximately 72 hours.   Time spent in minutes : [address],  PA-C on 08/09/2014 at 7:02 PM Between 7am to 7pm - Pager - [phone] After 7pm go to [email or web address] - password [email or web address] And look for the night coverage person covering me after hours  [facility]

## 2014-08-09 NOTE — Progress Notes (Signed)
ANTICOAGULATION CONSULT NOTE - Initial Consult  Pharmacy Consult for Coumadin Indication: atrial fibrillation  No Known Allergies  Patient Measurements: Height: 5\' 5"  (165.1 cm) Weight: 248 lb (112.492 kg) IBW/kg (Calculated) : 61.5  Vital Signs: Temp: 100 F (37.8 C) (06/05 1511) Temp Source: Oral (06/05 1511) BP: 139/75 mmHg (06/05 1628) Pulse Rate: 103 (06/05 1628)  Labs:  Recent Labs  08/09/14 1118  HGB 9.0*  HCT 30.6*  PLT 111*  LABPROT 28.2*  INR 2.69*  CREATININE 0.99    Estimated Creatinine Clearance: 65.5 mL/min (by C-G formula based on Cr of 0.99).   Medical History: Past Medical History  Diagnosis Date  . Diabetes mellitus   . Hypertension   . Stroke 08/2011, 03/2012    L MCA (Afib RVR while admitted for hypotension) - expressive aphasia (almost totally recovered)  . Seizures     Post CVA; on Keppra  . OSA on CPAP   . Paroxysmal atrial fibrillation     on coumadin; Diagnosed at the time of his 1st CVA  . Obesity, morbid     BMI  . Heart attack 1997    inferior MI with left circumflex stent  . CAD S/P percutaneous coronary angioplasty 1997    status post remote PCI to the LAD and circumflex in 1997, heart cath in 2008 showed widely patent stents in the circumflex and LAD.  RCA had a mid lesion and then was totally occluded distally.  . Myelodysplastic syndrome     w/mild anemia an neutropenia  . BPH (benign prostatic hyperplasia)     TURP in 12/2010  . CHF (congestive heart failure)   . Asbestosis     family unclear where he was evaluated in the past    Medications:  Per anticoag clinic visit on 5/16 - Coumadin 5mg  on Tues, Thurs, Sat; 7.5mg  all other days  Assessment: 81 year old male admitted with sepsis. He is on chronic anticoagulation with Coumadin for atrial fibrillation.  His INR is currently therapeutic.  Goal of Therapy:  INR 2-3   Plan:  Per discussion with family he has not taken any medications today. Give Coumadin 7.5mg   today as per his home dose. Daily PT/INR  Legrand Como, Pharm.D., BCPS, AAHIVP Clinical Pharmacist Phone: [phone] or [phone] 08/09/2014, 4:37 PM

## 2014-08-09 NOTE — ED Notes (Addendum)
Pt from home via [facility] with c/o "feeling off" starting last night.  LSW last night.  Pt is normally ambulatory with cane, Ox4.  Pt Ox2, difficulty with concentration/easily distracted, follows commands.  Temp with EMS 100, SpO2 91% on RA.  Hx stroke 2 years ago, with seizures and stuttering as residual.  On arrival to ED pt oral temp 103.1, urinary incontinence noted.  Pt in NAD, alert.

## 2014-08-09 NOTE — ED Notes (Signed)
Both blood cultures were collected prior to ABX start.

## 2014-08-09 NOTE — ED Notes (Signed)
Pt remains monitored by blood pressure, pulse ox, and 12 lead. pts family remains at bedside.  

## 2014-08-09 NOTE — Progress Notes (Addendum)
ANTIBIOTIC CONSULT NOTE - INITIAL  Pharmacy Consult for Vancomycin and Zosyn Indication: rule out sepsis  No Known Allergies  Patient Measurements: Height: 5\' 5"  (165.1 cm) Weight: 248 lb (112.492 kg) IBW/kg (Calculated) : 61.5 Adjusted Body Weight: 81.9  Vital Signs: Temp: 103.1 F (39.5 C) (06/05 1054) Temp Source: Oral (06/05 1054) BP: 169/71 mmHg (06/05 1130) Pulse Rate: 109 (06/05 1130)  Microbiology: No results found for this or any previous visit (from the past 720 hour(s)).  Medical History: Past Medical History  Diagnosis Date  . Diabetes mellitus   . Hypertension   . Stroke 08/2011, 03/2012    L MCA (Afib RVR while admitted for hypotension) - expressive aphasia (almost totally recovered)  . Seizures     Post CVA; on Keppra  . OSA on CPAP   . Paroxysmal atrial fibrillation     on coumadin; Diagnosed at the time of his 1st CVA  . Obesity, morbid     BMI  . Heart attack 1997    inferior MI with left circumflex stent  . CAD S/P percutaneous coronary angioplasty 1997    status post remote PCI to the LAD and circumflex in 1997, heart cath in 2008 showed widely patent stents in the circumflex and LAD.  RCA had a mid lesion and then was totally occluded distally.  . Myelodysplastic syndrome     w/mild anemia an neutropenia  . BPH (benign prostatic hyperplasia)     TURP in 12/2010  . CHF (congestive heart failure)   . Asbestosis     family unclear where he was evaluated in the past    Medications:  Anti-infectives    Start     Dose/Rate Route Frequency Ordered Stop   08/09/14 1115  piperacillin-tazobactam (ZOSYN) IVPB 3.375 g     3.375 g 100 mL/hr over 30 Minutes Intravenous  Once 08/09/14 1112     08/09/14 1115  vancomycin (VANCOCIN) IVPB 1000 mg/200 mL premix     1,000 mg 200 mL/hr over 60 Minutes Intravenous  Once 08/09/14 1112       Assessment: 81 yo male with altered mental status and code sepsis called for possible infection with temp. To 103.  We  have been asked to dose his IV antibiotics of Vancomycin and Zosyn.  He had a normalized creatinine of 1.0 a year ago.  This gives him an expected crcl ~ 78ml/min.  He is obese at 112kg and has an adjusted weight of 81kg.  Goal of Therapy:  Vancomycin trough level 15-20 mcg/ml  Plan:  - He has received one dose of IV Vancomycin 1gm and 3.375gm of IV Zosyn at ~ 11:30AM - Will check Bmet to ensure his renal function is appropriate and dose adjust accordingly  Rober Minion, PharmD., MS Clinical Pharmacist Pager:  [phone] Thank you for allowing pharmacy to be part of this patients care team. 08/09/2014,11:34 AM  Addendum: SCr 0.99 (CrCl ~66). Fortune Brannigan, vancomycin 750 mg iv q12h, 1st dose at midnight and zosyn 3.375g iv q8h (4 hr infusion) with next dose @ 1800.  Ysidro Evert Christel Bai, PharmD, BCPS

## 2014-08-10 ENCOUNTER — Ambulatory Visit (HOSPITAL_COMMUNITY): Payer: Commercial Managed Care - HMO

## 2014-08-10 ENCOUNTER — Inpatient Hospital Stay (HOSPITAL_COMMUNITY): Payer: Commercial Managed Care - HMO

## 2014-08-10 DIAGNOSIS — R509 Fever, unspecified: Secondary | ICD-10-CM

## 2014-08-10 DIAGNOSIS — I350 Nonrheumatic aortic (valve) stenosis: Secondary | ICD-10-CM

## 2014-08-10 DIAGNOSIS — R4182 Altered mental status, unspecified: Secondary | ICD-10-CM

## 2014-08-10 HISTORY — DX: Nonrheumatic aortic (valve) stenosis: I35.0

## 2014-08-10 LAB — URINE CULTURE
Colony Count: NO GROWTH
Culture: NO GROWTH

## 2014-08-10 LAB — CBC
HEMATOCRIT: 29.5 % — AB (ref 39.0–52.0)
Hemoglobin: 8.8 g/dL — ABNORMAL LOW (ref 13.0–17.0)
MCH: 21.8 pg — AB (ref 26.0–34.0)
MCHC: 29.8 g/dL — ABNORMAL LOW (ref 30.0–36.0)
MCV: 73 fL — ABNORMAL LOW (ref 78.0–100.0)
Platelets: 103 10*3/uL — ABNORMAL LOW (ref 150–400)
RBC: 4.04 MIL/uL — ABNORMAL LOW (ref 4.22–5.81)
RDW: 21.2 % — ABNORMAL HIGH (ref 11.5–15.5)
WBC: 12.6 10*3/uL — ABNORMAL HIGH (ref 4.0–10.5)

## 2014-08-10 LAB — BASIC METABOLIC PANEL
Anion gap: 7 (ref 5–15)
BUN: 6 mg/dL (ref 6–20)
CO2: 21 mmol/L — ABNORMAL LOW (ref 22–32)
Calcium: 7.4 mg/dL — ABNORMAL LOW (ref 8.9–10.3)
Chloride: 108 mmol/L (ref 101–111)
Creatinine, Ser: 1.02 mg/dL (ref 0.61–1.24)
Glucose, Bld: 97 mg/dL (ref 65–99)
Potassium: 3.8 mmol/L (ref 3.5–5.1)
Sodium: 136 mmol/L (ref 135–145)

## 2014-08-10 LAB — PROTIME-INR
INR: 2.99 — AB (ref 0.00–1.49)
PROTHROMBIN TIME: 30.5 s — AB (ref 11.6–15.2)

## 2014-08-10 LAB — HIV ANTIBODY (ROUTINE TESTING W REFLEX): HIV Screen 4th Generation wRfx: NONREACTIVE

## 2014-08-10 LAB — BRAIN NATRIURETIC PEPTIDE: B Natriuretic Peptide: 1325.5 pg/mL — ABNORMAL HIGH (ref 0.0–100.0)

## 2014-08-10 LAB — STREP PNEUMONIAE URINARY ANTIGEN: Strep Pneumo Urinary Antigen: NEGATIVE

## 2014-08-10 MED ORDER — ENSURE ENLIVE PO LIQD
237.0000 mL | Freq: Two times a day (BID) | ORAL | Status: DC
  Administered 2014-08-10 – 2014-08-14 (×8): 237 mL via ORAL

## 2014-08-10 MED ORDER — LEVOFLOXACIN IN D5W 750 MG/150ML IV SOLN
750.0000 mg | INTRAVENOUS | Status: DC
  Administered 2014-08-10 – 2014-08-13 (×4): 750 mg via INTRAVENOUS
  Filled 2014-08-10 (×4): qty 150

## 2014-08-10 MED ORDER — CETYLPYRIDINIUM CHLORIDE 0.05 % MT LIQD
7.0000 mL | Freq: Two times a day (BID) | OROMUCOSAL | Status: DC
Start: 2014-08-10 — End: 2014-08-14
  Administered 2014-08-10 – 2014-08-14 (×9): 7 mL via OROMUCOSAL

## 2014-08-10 MED ORDER — ACETAMINOPHEN 325 MG PO TABS
650.0000 mg | ORAL_TABLET | Freq: Four times a day (QID) | ORAL | Status: DC | PRN
  Administered 2014-08-10: 650 mg via ORAL
  Filled 2014-08-10: qty 2

## 2014-08-10 MED ORDER — WARFARIN SODIUM 5 MG PO TABS
5.0000 mg | ORAL_TABLET | Freq: Once | ORAL | Status: AC
  Administered 2014-08-10: 5 mg via ORAL
  Filled 2014-08-10: qty 1

## 2014-08-10 NOTE — Evaluation (Signed)
Physical Therapy Evaluation Patient Details Name: Terry Robertson. MRN: [id number] DOB: 03-14-1928 Today's Date: 08/10/2014   History of Present Illness  Pt is an 81 y/o male with a PMH of CVA, seizures, myelodysplastic syndrome, MI, pneumothorax, CHF, asbestosis, DM. Pt presents to the ED with confusion and likely LLL PNA.   Clinical Impression  Pt admitted with above diagnosis. Pt currently with functional limitations due to the deficits listed below (see PT Problem List). At the time of PT eval pt was able to perform transfers with +2 assist mainly for safety. Fatigue was limiting gait training this session, however usually uses a walker at home for ambulation. Pt will benefit from skilled PT to increase their independence and safety with mobility to allow discharge to the venue listed below. Discussed rehab options with pt and family. They are wanting CIR at d/c, however explained that at this time I do not feel that CIR is appropriate, and the patient is more at a SNF level. Advised that if the patient is able to tolerate more functional activity in following sessions to qualify for CIR, then we will pursue it.      Follow Up Recommendations SNF;Supervision/Assistance - 24 hour    Equipment Recommendations  None recommended by PT    Recommendations for Other Services       Precautions / Restrictions Precautions Precautions: Fall Restrictions Weight Bearing Restrictions: No      Mobility  Bed Mobility Overal bed mobility: Needs Assistance Bed Mobility: Rolling;Sidelying to Sit Rolling: Min assist Sidelying to sit: Mod assist       General bed mobility comments: Assist for movement of LE's to EOB as well as for trunk elevation to full sitting position. Pt required hand-over-hand assist to reach for the bed rails.   Transfers Overall transfer level: Needs assistance Equipment used: 2 person hand held assist Transfers: Sit to/from [facility] Sit to Stand: Mod  assist;+2 safety/equipment Stand pivot transfers: Min assist;+2 safety/equipment       General transfer comment: Assist to power-up to full standing position as well as gain/maintain standing balance prior to taking pivotal steps around to the recliner.   Ambulation/Gait             General Gait Details: Pt was not able to ambulate at this time due to fatigue.   Stairs            Wheelchair Mobility    Modified Rankin (Stroke Patients Only)       Balance Overall balance assessment: Needs assistance Sitting-balance support: Feet supported;No upper extremity supported Sitting balance-Leahy Scale: Fair     Standing balance support: No upper extremity supported Standing balance-Leahy Scale: Poor Standing balance comment: Requires UE support to maintain balance. Pt is very flexed in standing.                              Pertinent Vitals/Pain Pain Assessment: Faces Faces Pain Scale: No hurt    Home Living Family/patient expects to be discharged to:: Skilled nursing facility Living Arrangements: Children                    Prior Function Level of Independence: Independent with assistive device(s)         Comments: It appears that the patient was using a RW at home, and independent with ADL's, however his son was available if needed. The patient wasn't able to tell me with what  tasks the son assisted with or how often.      Hand Dominance        Extremity/Trunk Assessment   Upper Extremity Assessment: Defer to OT evaluation           Lower Extremity Assessment: Generalized weakness      Cervical / Trunk Assessment: Kyphotic;Other exceptions  Communication   Communication: HOH;Expressive difficulties  Cognition Arousal/Alertness: Awake/alert Behavior During Therapy: Flat affect Overall Cognitive Status: Impaired/Different from baseline Area of Impairment: Attention;Safety/judgement;Awareness;Problem solving   Current  Attention Level: Selective Memory: Decreased short-term memory   Safety/Judgement: Decreased awareness of safety;Decreased awareness of deficits Awareness: Emergent Problem Solving: Slow processing;Decreased initiation;Difficulty sequencing;Requires verbal cues      General Comments      Exercises        Assessment/Plan    PT Assessment Patient needs continued PT services  PT Diagnosis Difficulty walking;Generalized weakness   PT Problem List Decreased strength;Decreased range of motion;Decreased activity tolerance;Decreased balance;Decreased mobility;Decreased knowledge of use of DME;Decreased safety awareness;Decreased knowledge of precautions;Cardiopulmonary status limiting activity  PT Treatment Interventions DME instruction;Gait training;Stair training;Functional mobility training;Therapeutic activities;Therapeutic exercise;Neuromuscular re-education;Patient/family education   PT Goals (Current goals can be found in the Care Plan section) Acute Rehab PT Goals Patient Stated Goal: Go to rehab (pt nd family wanting CIR - explained that pt is at a SNF level at this time and we can look at [facility] when he is able to tolerate more functional activity).  PT Goal Formulation: With patient/family Time For Goal Achievement: 08/24/14 Potential to Achieve Goals: Good    Frequency Min 3X/week   Barriers to discharge        Co-evaluation               End of Session Equipment Utilized During Treatment: Gait belt;Oxygen Activity Tolerance: Patient limited by fatigue Patient left: in chair;with call bell/phone within reach;with family/visitor present Nurse Communication: Mobility status         Time: 8115-7262 PT Time Calculation (min) (ACUTE ONLY): 23 min   Charges:   PT Evaluation $Initial PT Evaluation Tier I: 1 Procedure PT Treatments $Therapeutic Activity: 8-22 mins   PT G Codes:        Rolinda Roan 2014-08-24, 3:18 PM   Rolinda Roan, PT, DPT Acute  Rehabilitation Services Pager: [phone]

## 2014-08-10 NOTE — Progress Notes (Signed)
RT placed patient on CPAP for the night. Pt tolerating well. RT will continue to monitor.

## 2014-08-10 NOTE — Progress Notes (Signed)
Utilization Review Completed.  

## 2014-08-10 NOTE — Progress Notes (Addendum)
Initial Nutrition Assessment  DOCUMENTATION CODES:  Obesity unspecified  INTERVENTION:  Ensure Enlive (each supplement provides 350kcal and 20 grams of protein)  NUTRITION DIAGNOSIS:  Inadequate oral intake related to  (poor appetite) as evidenced by meal completion < 50%   GOAL:  Patient will meet greater than or equal to 90% of their needs  MONITOR:  PO intake, Supplement acceptance, Labs, Weight trends, I & O's  REASON FOR ASSESSMENT:  Malnutrition Screening Tool  ASSESSMENT: 81 y/o Male with PMH significant for CVA, seizures, myelodysplastic syndrome, MI, hx of pneumothorax, CHF, asbestosis, diabetes and obstructive sleep apnea; who presented to ED due to confusion, fever and tachypnea. Patient in ED was found to have temp of 103, RR of 34 and HR of 114; also CXR suggesting PNA. And patient was admitted for further tx and evaluation of sepsis due to CAP.  Patient currently in ECHO LAB.  Per Malnutrition Screening Tool Report, pt reported eating poorly because of a decreased appetite and recent weight loss without trying.  Per weight readings below, pt has had a 5% weight loss since January 2016 -- not significant for time frame.  PO intake poor at 25%.  Pt would benefit from addition of oral nutrition supplements.  RD to order.    RD unable to complete Nutrition Focused Physical Exam at this time.  Height:  Ht Readings from Last 1 Encounters:  08/09/14 5\' 5"  (1.651 m)    Weight:  Wt Readings from Last 1 Encounters:  08/10/14 237 lb 14 oz (107.9 kg)    Ideal Body Weight:  62 kg  Wt Readings from Last 10 Encounters:  08/10/14 237 lb 14 oz (107.9 kg)  03/19/14 249 lb 11.2 oz (113.263 kg)  02/16/14 248 lb (112.492 kg)  02/02/14 256 lb (116.121 kg)  01/08/14 251 lb (113.853 kg)  10/03/13 248 lb 4.8 oz (112.628 kg)  09/29/13 244 lb 11.2 oz (110.995 kg)  08/14/13 241 lb 8 oz (109.544 kg)  08/06/13 239 lb 6.4 oz (108.591 kg)  07/29/13 240 lb (108.863 kg)     BMI:  Body mass index is 39.58 kg/(m^2).  Estimated Nutritional Needs:  Kcal:  1700-1900  Protein:  90-100 gm  Fluid:  1.7-1.9 L  Skin:  Reviewed, no issues  Diet Order:  Diet Heart Room service appropriate?: Yes; Fluid consistency:: Thin  EDUCATION NEEDS:  No education needs identified at this time   Intake/Output Summary (Last 24 hours) at 08/10/14 1444 Last data filed at 08/10/14 1321  Gross per 24 hour  Intake    840 ml  Output    825 ml  Net     15 ml    Last BM:  Unknown  Arthur Holms, RD, LDN Pager #: [phone] After-Hours Pager #: [phone]

## 2014-08-10 NOTE — Procedures (Signed)
Pt placed on hospital CPAP machine set on auto mode. Max-20, min-5.  Pt is resting comfortably at this time.

## 2014-08-10 NOTE — Progress Notes (Signed)
Occupational Therapy Evaluation Patient Details Name: Terry Robertson. MRN: [id number] DOB: 1928-11-16 Today's Date: 08/10/2014    History of Present Illness Pt is an 81 y/o male with a PMH of CVA, seizures, myelodysplastic syndrome, MI, pneumothorax, CHF, asbestosis, DM. Pt presents to the ED with confusion and likely LLL PNA.    Clinical Impression   PTA, pt mod I with ADL and mobility. Pt presents with decline in functional status, although pt states he feels he is getting close to baseline. Family not available to confirm. Pt ambulated to sink with min guard A @ RW level on RA with O2 sats @ 95. If pt continues to progress and family is able to provide 24/7 S, feel pt may progress to being able to D/C home with [facility]. Will follow acutely to facilitate appropriate D/C plan and address established goals.     Follow Up Recommendations  SNF;Supervision/Assistance - 24 hour (pending progress. If 24/7 S available, pt may progress to [facility])    Equipment Recommendations  None recommended by OT    Recommendations for Other Services       Precautions / Restrictions Precautions Precautions: Fall Restrictions Weight Bearing Restrictions: No      Mobility Bed Mobility        General bed mobility comments: pt up in chair  Transfers Overall transfer level: Needs assistance  Transfers: Sit to/from Stand;Stand Pivot Transfers Sit to Stand: Supervision Stand pivot transfers: Min guard       General transfer comment: much improved this pm    Balance Overall balance assessment: Needs assistance Sitting-balance support: Feet supported;No upper extremity supported Sitting balance-Leahy Scale: Good     Standing balance support: No upper extremity supported Standing balance-Leahy Scale: Fair Standing balance comment: able to stand unsupported at sink                            ADL Overall ADL's : Needs assistance/impaired     Grooming: Set up;Standing   Upper Body  Bathing: Set up;Standing   Lower Body Bathing: Minimal assistance;Sit to/from stand   Upper Body Dressing : Set up   Lower Body Dressing: Minimal assistance;Sit to/from stand   Toilet Transfer: Min guard   Toileting- Water quality scientist and Hygiene: Min guard       Functional mobility during ADLs: Minimal assistance;Rolling walker General ADL Comments: S sit - stand. Ambulated to sink with min A. Able to stand at sink x 5 min with S during grooming task. O2 Sats 95 RA                     Pertinent Vitals/Pain Pain Assessment: No/denies pain Faces Pain Scale: No hurt  O2 95-97 RA. Vitals stable.     Hand Dominance Right   Extremity/Trunk Assessment Upper Extremity Assessment Upper Extremity Assessment: Generalized weakness   Lower Extremity Assessment Lower Extremity Assessment: Generalized weakness   Cervical / Trunk Assessment Cervical / Trunk Assessment: Kyphotic Cervical / Trunk Exceptions: Forward head posture and very flexed trunk - baseline poture - pt reports back injury previously   Communication Communication Communication: HOH   Cognition Arousal/Alertness: Awake/alert Behavior During Therapy: WFL for tasks assessed/performed Overall Cognitive Status: No family/caregiver present to determine baseline cognitive functioning (slow processing) Area of Impairment: Attention;Safety/judgement;Awareness;Problem solving   Current Attention Level: Selective Memory: Decreased short-term memory   Safety/Judgement: Decreased awareness of safety;Decreased awareness of deficits Awareness: Emergent Problem Solving: Slow processing;Decreased initiation;Difficulty sequencing;Requires verbal  cues                        Home Living Family/patient expects to be discharged to:: Skilled nursing facility Living Arrangements: Children                               Additional Comments: Pt may progress to D/C home with [facility]      Prior  Functioning/Environment Level of Independence: Independent with assistive device(s)        Comments: Pt states he walked every day for exercise    OT Diagnosis: Generalized weakness   OT Problem List: Decreased strength;Decreased activity tolerance;Decreased range of motion;Impaired balance (sitting and/or standing);Decreased safety awareness;Decreased knowledge of use of DME or AE;Cardiopulmonary status limiting activity;Obesity   OT Treatment/Interventions: Self-care/ADL training;Therapeutic exercise;Energy conservation;DME and/or AE instruction;Therapeutic activities;Patient/family education;Balance training    OT Goals(Current goals can be found in the care plan section) Acute Rehab OT Goals Patient Stated Goal: to get stronger OT Goal Formulation: With patient Time For Goal Achievement: 08/24/14 Potential to Achieve Goals: Good ADL Goals Pt Will Perform Lower Body Bathing: with supervision;sit to/from stand;with caregiver independent in assisting Pt Will Perform Lower Body Dressing: with supervision;with caregiver independent in assisting;sit to/from stand Pt Will Transfer to Toilet: with supervision;ambulating;bedside commode Pt Will Perform Toileting - Clothing Manipulation and hygiene: with modified independence;sit to/from stand Pt/caregiver will Perform Home Exercise Program: Both right and left upper extremity;With Supervision;With written HEP provided;Increased strength  OT Frequency: Min 2X/week   Barriers to D/C:            Co-evaluation              End of Session Equipment Utilized During Treatment: Gait belt;Rolling walker Nurse Communication: Mobility status  Activity Tolerance: Patient tolerated treatment well Patient left: in chair;with call bell/phone within reach   Time: 0370-4888 OT Time Calculation (min): 25 min Charges:  OT General Charges $OT Visit: 1 Procedure OT Evaluation $Initial OT Evaluation Tier I: 1 Procedure OT Treatments $Self  Care/Home Management : 8-22 mins G-Codes:    Jalani Cullifer,HILLARY 2014/08/21, 5:10 PM   [facility], OTR/L  [phone] 2014-08-21

## 2014-08-10 NOTE — Progress Notes (Signed)
[facility] PROGRESS NOTE  Terry Robertson. ERD:[id number] DOB: Jul 22, 1928 DOA: 08/09/2014  PCP: [name], MD  Brief HPI: 81 year old African-American male with a past medical history of stroke, seizures, myelodysplastic syndrome, congestive heart failure (diastolic), history of obstructive sleep apnea, who presented to the emergency department with a temperature of 103.57F. He also gave a history of cough. His chest x-ray suggested pneumonia in the left lower base. Patient was admitted to the hospital for further management  Past medical history:  Past Medical History  Diagnosis Date  . Diabetes mellitus   . Hypertension   . Stroke 08/2011, 03/2012    L MCA (Afib RVR while admitted for hypotension) - expressive aphasia (almost totally recovered)  . Seizures     Post CVA; on Keppra  . OSA on CPAP   . Paroxysmal atrial fibrillation     on coumadin; Diagnosed at the time of his 1st CVA  . Obesity, morbid     BMI  . Heart attack 1997    inferior MI with left circumflex stent  . CAD S/P percutaneous coronary angioplasty 1997    status post remote PCI to the LAD and circumflex in 1997, heart cath in 2008 showed widely patent stents in the circumflex and LAD.  RCA had a mid lesion and then was totally occluded distally.  . Myelodysplastic syndrome     w/mild anemia an neutropenia  . BPH (benign prostatic hyperplasia)     TURP in 12/2010  . CHF (congestive heart failure)   . Asbestosis     family unclear where he was evaluated in the past    Consultants: None  Procedures: None  Antibiotics: Vancomycin and Zosyn 6/5 Levaquin 6/6  Subjective: Patient feels better this morning. Denies any chest pain. No nausea or vomiting. Denies any abdominal pain. His significant other is at the bedside.  Objective: Vital Signs  Filed Vitals:   08/10/14 0722 08/10/14 0833 08/10/14 1000 08/10/14 1208  BP: 138/59  122/59 103/56  Pulse: 94  77 69  Temp: 102.4 F (39.1 C)   98.1  F (36.7 C)  TempSrc: Axillary   Oral  Resp: 40  40 39  Height:      Weight:      SpO2: 99% 97% 97% 97%    Intake/Output Summary (Last 24 hours) at 08/10/14 1320 Last data filed at 08/10/14 1203  Gross per 24 hour  Intake   4540 ml  Output    950 ml  Net   3590 ml   Filed Weights   08/09/14 1117 08/09/14 1830 08/10/14 0321  Weight: 112.492 kg (248 lb) 107.4 kg (236 lb 12.4 oz) 107.9 kg (237 lb 14 oz)    General appearance: alert, cooperative, appears stated age and no distress Resp: Diminished air entry at the bases. Few crackles present bilaterally. No wheezing. No rhonchi. Cardio: regular rate and rhythm, S1, S2 normal, no murmur, click, rub or gallop GI: soft, non-tender; bowel sounds normal; no masses,  no organomegaly Extremities: extremities normal, atraumatic, no cyanosis or edema Neurologic: Alert. No focal neurological deficits. Mild right-sided weakness from previous stroke.  Lab Results:  Basic Metabolic Panel:  Recent Labs Lab 08/09/14 1118 08/10/14 0326  NA 134* 136  K 3.7 3.8  CL 104 108  CO2 23 21*  GLUCOSE 90 97  BUN 8 6  CREATININE 0.99 1.02  CALCIUM 8.1* 7.4*   Liver Function Tests:  Recent Labs Lab 08/09/14 1118  AST 19  ALT 12*  ALKPHOS 56  BILITOT 0.4  PROT 10.7*  ALBUMIN 2.4*   CBC:  Recent Labs Lab 08/09/14 1118 08/10/14 0326  WBC 7.7 12.6*  NEUTROABS 4.9  --   HGB 9.0* 8.8*  HCT 30.6* 29.5*  MCV 73.7* 73.0*  PLT 111* 103*   BNP (last 3 results)  Recent Labs  08/09/14 1118  BNP 154.8*    CBG:  Recent Labs Lab 08/09/14 1111  GLUCAP 85    Recent Results (from the past 240 hour(s))  Culture, blood (routine x 2)     Status: None (Preliminary result)   Collection Time: 08/09/14 11:15 AM  Result Value Ref Range Status   Specimen Description BLOOD RIGHT ARM  Final   Special Requests BOTTLES DRAWN AEROBIC AND ANAEROBIC 5ML  Final   Culture   Final           BLOOD CULTURE RECEIVED NO GROWTH TO DATE CULTURE WILL  BE HELD FOR 5 DAYS BEFORE ISSUING A FINAL NEGATIVE REPORT Performed at [facility]    Report Status PENDING  Incomplete  Culture, blood (routine x 2)     Status: None (Preliminary result)   Collection Time: 08/09/14 11:25 AM  Result Value Ref Range Status   Specimen Description BLOOD RIGHT HAND  Final   Special Requests BOTTLES DRAWN AEROBIC AND ANAEROBIC 5ML  Final   Culture   Final           BLOOD CULTURE RECEIVED NO GROWTH TO DATE CULTURE WILL BE HELD FOR 5 DAYS BEFORE ISSUING A FINAL NEGATIVE REPORT Performed at [facility]    Report Status PENDING  Incomplete  MRSA PCR Screening     Status: None   Collection Time: 08/09/14  7:01 PM  Result Value Ref Range Status   MRSA by PCR NEGATIVE NEGATIVE Final    Comment:        The GeneXpert MRSA Assay (FDA approved for NASAL specimens only), is one component of a comprehensive MRSA colonization surveillance program. It is not intended to diagnose MRSA infection nor to guide or monitor treatment for MRSA infections.       Studies/Results: X-ray Chest Pa And Lateral  08/10/2014   CLINICAL DATA:  Pneumonia.  Weakness, shortness of breath.  EXAM: CHEST  2 VIEW  COMPARISON:  08/09/2014  FINDINGS: Cardiomegaly with vascular congestion. Increasing interstitial prominence throughout the lungs likely reflects interstitial edema. Bibasilar opacities, left greater than right could reflect atelectasis or infiltrates. Small bilateral effusions, left greater than right. No acute bony abnormality.  IMPRESSION: Increasing interstitial prominence, likely interstitial edema.  Bibasilar atelectasis or infiltrates, left greater than right.  Small bilateral effusions.   Electronically Signed   By: Rolm Baptise M.D.   On: 08/10/2014 10:13   Dg Chest Portable 1 View  08/09/2014   CLINICAL DATA:  Altered mental status.  Prior stroke.  EXAM: PORTABLE CHEST - 1 VIEW  COMPARISON:  02/02/2014  FINDINGS: Normal mediastinum with enlarged cardiac  silhouette. There is left pleural effusion. The left base is poorly evaluated. Central venous pulmonary congestion noted.  IMPRESSION: Left pleural effusion and atelectasis. Cannot exclude left lower lobe pneumonia.   Electronically Signed   By: Suzy Bouchard M.D.   On: 08/09/2014 12:04    Medications:  Scheduled: . antiseptic oral rinse  7 mL Mouth Rinse BID  . aspirin EC  81 mg Oral Daily  . atenolol  25 mg Oral Daily  . atorvastatin  20 mg Oral q1800  . budesonide (  PULMICORT) nebulizer solution  0.25 mg Nebulization BID  . diltiazem  180 mg Oral Daily  . docusate sodium  100 mg Oral BID  . guaiFENesin  1,200 mg Oral BID  . latanoprost  1 drop Both Eyes QHS  . levETIRAcetam  500 mg Oral Q12H  . levofloxacin (LEVAQUIN) IV  750 mg Intravenous Q24H  . mirabegron ER  50 mg Oral Daily  . piperacillin-tazobactam (ZOSYN)  IV  3.375 g Intravenous Q8H  . sodium chloride  3 mL Intravenous Q12H  . sodium chloride  3 mL Intravenous Q12H  . vancomycin  750 mg Intravenous Q12H  . warfarin  5 mg Oral ONCE-1800  . Warfarin - Pharmacist Dosing Inpatient   Does not apply q1800   Continuous:  NOM:VEHMCN chloride, acetaminophen, albuterol, alum & mag hydroxide-simeth, bisacodyl, HYDROcodone-acetaminophen, ipratropium-albuterol, ondansetron **OR** ondansetron (ZOFRAN) IV, sodium chloride  Assessment/Plan:  Principal Problem:   Sepsis Active Problems:   Myelodysplastic syndrome   Dyslipidemia, goal LDL below 70   Essential hypertension   DM type 2 (diabetes mellitus, type 2)   Diastolic dysfunction: Grade 2   Thrombocytopenia, chronic   PAF (paroxysmal atrial fibrillation)   CAP (community acquired pneumonia)   Left ventricular diastolic dysfunction, NYHA class 2 (grade 2 dysfunction with elevated filling pressures)   Altered mental status   Microcytic anemia     Sepsis Thought to be secondary to left lower lobe pneumonia with pleural effusion. Vanco and Zosyn per pharmacy. Blood  cultures are pending. Levaquin was added this morning to cover atypicals. Patient received 2.5 L of fluid in the emergency department. Lactic acid is normal.  Fever with questionable pneumonia with pleural effusion  Repeat chest x-ray shows an element of fluid overload. He did get a lot of fluids in the emergency department and does have a history of diastolic CHF. However, at the same time, I do feel that predominantly he does have an infectious etiology for his presentation. We will check BNP and give him a small dose of Lasix if noted to be significantly elevated. Culture data is all pending including Blood cultures, sputum cultures, urine for Legionella and strep pneumo antigen, HIV.   Altered mental status Likely due to acute infection in the setting of chronic deficit from previous stroke. Seems to be improving.  Diastolic dysfunction/chronic diastolic congestive heart failure Noted is grade 2 on a 2013 2-D echo. Repeat 2-D echocardiogram is pending. Chest x-ray suggests fluid overload. Check BNP. Home Lasix is being held due to sepsis.  Paroxysmal atrial fibrillation  on chronic Coumadin, atenolol, diltiazem. Continue for now.   History of coronary artery disease status post stent placement. Continue aspirin in addition to Coumadin.  Microcytic anemia and thrombocytopenia It appears to have developed slowly over the course of the past year. The patient sees Dr. Alvy Bimler for myelodysplastic syndrome. Of note he has hemoglobinuria and proteinuria.  Diabetes mellitus, type II Patient carries this diagnosis however he does not appear to be on any medications at home. Monitor blood sugars.   DVT Prophylaxis: On warfarin    Code Status: Full code  Family Communication: Discussed with the patient and his significant other  Disposition Plan: Will leave in stepdown for now. PT OT to evaluate.    LOS: 1 day   Eugenio Saenz [facility] Pager [phone] 08/10/2014, 1:20 PM  If  7PM-7AM, please contact night-coverage at [email or web address], password [facility]

## 2014-08-10 NOTE — Progress Notes (Signed)
ANTICOAGULATION CONSULT NOTE - Follow-up  Pharmacy Consult for Coumadin Indication: atrial fibrillation  No Known Allergies  Patient Measurements: Height: 5\' 5"  (165.1 cm) Weight: 237 lb 14 oz (107.9 kg) IBW/kg (Calculated) : 61.5  Vital Signs: Temp: 102.4 F (39.1 C) (06/06 0722) Temp Source: Axillary (06/06 0722) BP: 138/59 mmHg (06/06 0722) Pulse Rate: 94 (06/06 0722)  Labs:  Recent Labs  08/09/14 1118 08/10/14 0326  HGB 9.0* 8.8*  HCT 30.6* 29.5*  PLT 111* 103*  LABPROT 28.2* 30.5*  INR 2.69* 2.99*  CREATININE 0.99 1.02    Estimated Creatinine Clearance: 62.2 mL/min (by C-G formula based on Cr of 1.02).  Medications:  Per anticoag clinic visit on 5/16 - Coumadin 5mg  on Tues, Thurs, Sat; 7.5mg  all other days  Assessment: 81 year old male admitted with sepsis. He is on chronic anticoagulation with Coumadin for atrial fibrillation. His INR is therapeutic but at the upper end of therapeutic range at 2.99. No bleeding noted. H/H low but stable and ptls are also low.   Goal of Therapy:  INR 2-3   Plan:  - Warfarin 5mg  PO x 1 tonight - Daily INR  Salome Arnt, PharmD, BCPS Pager # [phone] 08/10/2014 10:33 AM

## 2014-08-10 NOTE — Evaluation (Signed)
Clinical/Bedside Swallow Evaluation Patient Details  Name: Terry Robertson. MRN: [id number] Date of Birth: 1928-07-29  Today's Date: 08/10/2014 Time: SLP Start Time (ACUTE ONLY): 0840 SLP Stop Time (ACUTE ONLY): 0853 SLP Time Calculation (min) (ACUTE ONLY): 13 min  Past Medical History:  Past Medical History  Diagnosis Date  . Diabetes mellitus   . Hypertension   . Stroke 08/2011, 03/2012    L MCA (Afib RVR while admitted for hypotension) - expressive aphasia (almost totally recovered)  . Seizures     Post CVA; on Keppra  . OSA on CPAP   . Paroxysmal atrial fibrillation     on coumadin; Diagnosed at the time of his 1st CVA  . Obesity, morbid     BMI  . Heart attack 1997    inferior MI with left circumflex stent  . CAD S/P percutaneous coronary angioplasty 1997    status post remote PCI to the LAD and circumflex in 1997, heart cath in 2008 showed widely patent stents in the circumflex and LAD.  RCA had a mid lesion and then was totally occluded distally.  . Myelodysplastic syndrome     w/mild anemia an neutropenia  . BPH (benign prostatic hyperplasia)     TURP in 12/2010  . CHF (congestive heart failure)   . Asbestosis     family unclear where he was evaluated in the past   Past Surgical History:  Past Surgical History  Procedure Laterality Date  . Transthoracic echocardiogram  08/06/11    EF 55% to 65% with grade2 diastolic dysfunction/pseudonormal with mildly dilated left atrium, had evidence of elevated filling ressures in the left vientricle and left atrium  . Cardiac catheterization  02/08/07    widely patent stent in the circumflex and LAD.  RCA had a mid lesion and then was totally occluded distally. Cook bare-metal 3.5x20 mm stent  . Transurethral resection of prostate  12/2010  . Nm myoview ltd  01/09/1999    Inferior wall thinning with possible mild ischemia versus infarct. This would correlate well with his known RCA occlusion   HPI:  81 y.o. male, with a past medical  history of CVA (2013, 2014), seizures, myelodysplastic syndrome, MI, pneumothorax, HTN, CHF, asbestosis, diabetes and obstructive sleep apnea admitted with temperature of 103.1, confusion, and likely left lower lobe pneumonia. CXRLeft pleural effusion and atelectasis. Cannot exclude left lower lobe pneumonia. BSE 03/20/2012 recommended Dys 1, no liquids, initiation of liquids on 03/21/12 and BSE 06/24/13 without overt difficulty and recommended regular texture and thin liquids.   Assessment / Plan / Recommendation Clinical Impression  Oral prep, mastication and transit throughout consistencies was functional. No difficulties with pharyngeal phase. Pt and family deny coughing during or after meals. Risk somewhat increased with prior CVA . Do not suspect he is a silent aspirator. Continue regular texture and thin liquids, straws allowed and pills with water. No follow up needed.     Aspiration Risk   (mild-mod)    Diet Recommendation Thin (regular)   Medication Administration: Whole meds with liquid Compensations: Small sips/bites;Slow rate    Other  Recommendations Oral Care Recommendations: Oral care BID   Follow Up Recommendations       Frequency and Duration        Pertinent Vitals/Pain none         Swallow Study           Oral/Motor/Sensory Function Overall Oral Motor/Sensory Function: Appears within functional limits for tasks assessed   Ice Chips Ice chips:  Not tested   Thin Liquid Thin Liquid: Within functional limits Presentation: Cup;Straw    Nectar Thick Nectar Thick Liquid: Not tested   Honey Thick Honey Thick Liquid: Not tested   Puree Puree: Within functional limits   Solid   GO    Solid: Within functional limits       Houston Siren 08/10/2014,9:05 AM  Orbie Pyo Colvin Caroli.Ed [employer] [phone]

## 2014-08-10 NOTE — Progress Notes (Signed)
ANTIBIOTIC CONSULT NOTE - INITIAL  Pharmacy Consult for Levaquin Indication: CAP  No Known Allergies  Patient Measurements: Height: 5\' 5"  (165.1 cm) Weight: 237 lb 14 oz (107.9 kg) IBW/kg (Calculated) : 61.5  Vital Signs: Temp: 102.4 F (39.1 C) (06/06 0722) Temp Source: Axillary (06/06 0722) BP: 138/59 mmHg (06/06 0722) Pulse Rate: 94 (06/06 0722) Intake/Output from previous day: 06/05 0701 - 06/06 0700 In: 4240 [P.O.:240; I.V.:3750; IV Piggyback:250] Out: 950 [Urine:950] Intake/Output from this shift:    Labs:  Recent Labs  08/09/14 1118 08/10/14 0326  WBC 7.7 12.6*  HGB 9.0* 8.8*  PLT 111* 103*  CREATININE 0.99 1.02   Estimated Creatinine Clearance: 62.2 mL/min (by C-G formula based on Cr of 1.02). No results for input(s): VANCOTROUGH, VANCOPEAK, VANCORANDOM, GENTTROUGH, GENTPEAK, GENTRANDOM, TOBRATROUGH, TOBRAPEAK, TOBRARND, AMIKACINPEAK, AMIKACINTROU, AMIKACIN in the last 72 hours.   Microbiology: Recent Results (from the past 720 hour(s))  MRSA PCR Screening     Status: None   Collection Time: 08/09/14  7:01 PM  Result Value Ref Range Status   MRSA by PCR NEGATIVE NEGATIVE Final    Comment:        The GeneXpert MRSA Assay (FDA approved for NASAL specimens only), is one component of a comprehensive MRSA colonization surveillance program. It is not intended to diagnose MRSA infection nor to guide or monitor treatment for MRSA infections.     Medical History: Past Medical History  Diagnosis Date  . Diabetes mellitus   . Hypertension   . Stroke 08/2011, 03/2012    L MCA (Afib RVR while admitted for hypotension) - expressive aphasia (almost totally recovered)  . Seizures     Post CVA; on Keppra  . OSA on CPAP   . Paroxysmal atrial fibrillation     on coumadin; Diagnosed at the time of his 1st CVA  . Obesity, morbid     BMI  . Heart attack 1997    inferior MI with left circumflex stent  . CAD S/P percutaneous coronary angioplasty 1997    status  post remote PCI to the LAD and circumflex in 1997, heart cath in 2008 showed widely patent stents in the circumflex and LAD.  RCA had a mid lesion and then was totally occluded distally.  . Myelodysplastic syndrome     w/mild anemia an neutropenia  . BPH (benign prostatic hyperplasia)     TURP in 12/2010  . CHF (congestive heart failure)   . Asbestosis     family unclear where he was evaluated in the past    Medications:  Prescriptions prior to admission  Medication Sig Dispense Refill Last Dose  . albuterol (PROVENTIL HFA;VENTOLIN HFA) 108 (90 BASE) MCG/ACT inhaler Inhale 2 puffs into the lungs every 6 (six) hours as needed for wheezing or shortness of breath. 1 Inhaler 2 Taking  . aspirin EC 81 MG tablet Take 81 mg by mouth daily.   Taking  . atenolol (TENORMIN) 25 MG tablet Take 1 tablet (25 mg total) by mouth daily. 30 tablet 3  at time  . atorvastatin (LIPITOR) 20 MG tablet Take 20 mg by mouth daily.   Taking  . bisacodyl (DULCOLAX) 5 MG EC tablet Take 5 mg by mouth daily as needed for moderate constipation.   Taking  . CARTIA XT 180 MG 24 hr capsule   3 Taking  . DILT-XR 180 MG 24 hr capsule Take 180 mg by mouth daily.  0   . furosemide (LASIX) 20 MG tablet Take 20 mg by  mouth daily.     Marland Kitchen gabapentin (NEURONTIN) 100 MG capsule Take 100 mg by mouth daily.    at consult  . HYDROcodone-acetaminophen (NORCO/VICODIN) 5-325 MG per tablet Take 1 tablet by mouth every 4 (four) hours as needed for moderate pain.   Taking  . latanoprost (XALATAN) 0.005 % ophthalmic solution Use as directed  4 Taking  . levETIRAcetam (KEPPRA) 500 MG tablet Take 1 tablet (500 mg total) by mouth every 12 (twelve) hours. 180 tablet 2  at consult  . MYRBETRIQ 50 MG TB24 tablet Take 50 mg by mouth daily.  3   . VASCEPA 1 G CAPS Take 1 g by mouth.  2   . Vitamin D, Ergocalciferol, (DRISDOL) 50000 UNITS CAPS Take 50,000 Units by mouth 2 (two) times a week. Takes 1 capsule on Wednesday and Saturday every week   Taking   . warfarin (COUMADIN) 5 MG tablet Take 1 tablet (5 mg total) by mouth daily. 135 tablet 1  at consult   Assessment: 81 y.o. male known to pharmacy from coumadin and antibiotic dosing. Pt already on Vancomycin and Zosyn (Day #2). To add Levaquin for atypical coverage. SCr 1.02, est CrCl 60 ml/min. Tm 103.1, TC 102.4. WBC up to 12.6. Cultures pending.  Goal of Therapy:  Resolution of infection  Plan:  Levaquin 750mg  IV q24h Will f/u micro data, pt's clinical condition and renal function Narrow abx as soon as able  Sherlon Handing, PharmD, BCPS Clinical pharmacist, pager [phone] 08/10/2014,7:32 AM

## 2014-08-10 NOTE — Progress Notes (Signed)
  Echocardiogram 2D Echocardiogram has been performed.  Terry Robertson 08/10/2014, 4:15 PM

## 2014-08-11 DIAGNOSIS — R7881 Bacteremia: Secondary | ICD-10-CM

## 2014-08-11 LAB — LEGIONELLA ANTIGEN, URINE

## 2014-08-11 LAB — CBC
HCT: 30.2 % — ABNORMAL LOW (ref 39.0–52.0)
HEMOGLOBIN: 8.8 g/dL — AB (ref 13.0–17.0)
MCH: 21.4 pg — ABNORMAL LOW (ref 26.0–34.0)
MCHC: 29.1 g/dL — ABNORMAL LOW (ref 30.0–36.0)
MCV: 73.5 fL — AB (ref 78.0–100.0)
Platelets: 98 10*3/uL — ABNORMAL LOW (ref 150–400)
RBC: 4.11 MIL/uL — ABNORMAL LOW (ref 4.22–5.81)
RDW: 21.2 % — AB (ref 11.5–15.5)
WBC: 9.1 10*3/uL (ref 4.0–10.5)

## 2014-08-11 LAB — BASIC METABOLIC PANEL
ANION GAP: 8 (ref 5–15)
BUN: 14 mg/dL (ref 6–20)
CHLORIDE: 109 mmol/L (ref 101–111)
CO2: 20 mmol/L — AB (ref 22–32)
Calcium: 7.8 mg/dL — ABNORMAL LOW (ref 8.9–10.3)
Creatinine, Ser: 1.19 mg/dL (ref 0.61–1.24)
GFR calc Af Amer: >60 mL/min (ref >60)
GFR calc non Af Amer: 55 mL/min — ABNORMAL LOW (ref >60)
GLUCOSE: 103 mg/dL — AB (ref 65–99)
Potassium: 3.9 mmol/L (ref 3.5–5.1)
SODIUM: 137 mmol/L (ref 135–145)

## 2014-08-11 LAB — OCCULT BLOOD X 1 CARD TO LAB, STOOL: FECAL OCCULT BLD: NEGATIVE

## 2014-08-11 LAB — PROTIME-INR
INR: 3.93 — ABNORMAL HIGH (ref 0.00–1.49)
Prothrombin Time: 37.5 seconds — ABNORMAL HIGH (ref 11.6–15.2)

## 2014-08-11 MED ORDER — FUROSEMIDE 20 MG PO TABS
20.0000 mg | ORAL_TABLET | Freq: Every day | ORAL | Status: DC
  Administered 2014-08-12 – 2014-08-14 (×3): 20 mg via ORAL
  Filled 2014-08-11 (×3): qty 1

## 2014-08-11 MED ORDER — FUROSEMIDE 10 MG/ML IJ SOLN
40.0000 mg | Freq: Once | INTRAMUSCULAR | Status: AC
  Administered 2014-08-11: 40 mg via INTRAVENOUS
  Filled 2014-08-11: qty 4

## 2014-08-11 NOTE — Clinical Documentation Improvement (Signed)
   Supporting Information:  History of chronic diastolic CHF  6/6 & 6/7 progress note:  Assessment/Plan: Diastolic dysfunction: Grade 2.  Left ventricular diastolic dysfunction, NYHA class 2 (grade 2 dysfunction with elevated filling pressures).  Fever with questionable pneumonia with pleural effusion  Repeat chest x-ray shows an element of fluid overload. He did get a lot of fluids in the emergency department and does have a history of diastolic CHF. However, at the same time, I do feel that predominantly he does have an infectious etiology for his presentation. We will check BNP and give him a small dose of Lasix if noted to be significantly elevated.  Diastolic dysfunction/chronic diastolic congestive heart failure Noted is grade 2 on a 2013 2-D echo. Repeat 2-D echocardiogram is pending. Chest x-ray suggests fluid overload. Check BNP. Home Lasix is being held due to sepsis.  BNP 6/5 = 154,8  % 6/6 = 1325  6/5 CXR: IMPRESSION: Left pleural effusion and atelectasis. Cannot exclude left lower lobe pneumonia.  6/6 CXR:  FINDINGS: Cardiomegaly with vascular congestion. Increasing interstitial prominence throughout the lungs likely reflects interstitial edema. Bibasilar opacities, left greater than right could reflect atelectasis or infiltrates. Small bilateral effusions, left greater than right. No acute bony abnormali IMPRESSION: Increasing interstitial prominence, likely interstitial edema. Bibasilar atelectasis or infiltrates, left greater than right. Small bilateral effusions.  6/6 Echo:  EF 60-65%  Treatments: Daily weights Monitor strict I&O  After study, please clarify in progress notes and discharge summary:   . Document acuity  --Chronic diastolic chf --Acute on Chronic diastolic chf --Other --Unable to determine  . Due to or associated with --Hypertension --Other (specify) --unable to determine  Thank You,  Omar Gayden T. Pricilla Handler, MSN, MBA/MHA Clinical  Documentation Specialist [email or web address] Office # [phone]

## 2014-08-11 NOTE — Progress Notes (Signed)
Pt and is refusing SNF placement at this time and would like to return home with home health services.  CSW informed RNCM of pt desire to return home.  CSW signing off.  Domenica Reamer, [place] Social Worker [phone]

## 2014-08-11 NOTE — Progress Notes (Signed)
[facility] PROGRESS NOTE  Terry Robertson. UXL:[id number] DOB: 09-06-1928 DOA: 08/09/2014  PCP: [name], MD  Brief HPI: 81 year old African-American male with a past medical history of stroke, seizures, myelodysplastic syndrome, congestive heart failure (diastolic), history of obstructive sleep apnea, who presented to the emergency department with a temperature of 103.71F. He also gave a history of cough. His chest x-ray suggested pneumonia in the left lower base. Patient was admitted to the hospital for further management. Patient improved symptomatically. He was subsequently transferred to the floor.  Past medical history:  Past Medical History  Diagnosis Date  . Diabetes mellitus   . Hypertension   . Stroke 08/2011, 03/2012    L MCA (Afib RVR while admitted for hypotension) - expressive aphasia (almost totally recovered)  . Seizures     Post CVA; on Keppra  . OSA on CPAP   . Paroxysmal atrial fibrillation     on coumadin; Diagnosed at the time of his 1st CVA  . Obesity, morbid     BMI  . Heart attack 1997    inferior MI with left circumflex stent  . CAD S/P percutaneous coronary angioplasty 1997    status post remote PCI to the LAD and circumflex in 1997, heart cath in 2008 showed widely patent stents in the circumflex and LAD.  RCA had a mid lesion and then was totally occluded distally.  . Myelodysplastic syndrome     w/mild anemia an neutropenia  . BPH (benign prostatic hyperplasia)     TURP in 12/2010  . CHF (congestive heart failure)   . Asbestosis     family unclear where he was evaluated in the past    Consultants: None  Procedures:   2-D echocardiogram Study Conclusions - Left ventricle: The cavity size was normal. There was moderateconcentric hypertrophy. Systolic function was normal. Theestimated ejection fraction was in the range of 60% to 65%. Wallmotion was normal; there were no regional wall motionabnormalities. Diastolic function cannot be  assessed, however,there is elevated LV filling pressure. - Aortic valve: Moderate calcification of the leaflet bases withrestricted leaflet opening. Mild aortic stenosis. Trileaflet.There was trivial regurgitation. Valve area (VTI): 1.91 cm^2.Valve area (Vmax): 2.04 cm^2. Valve area (Vmean): 1.9 cm^2. - Mitral valve: Mildly thickened leaflets - late systolic prolapseof the anterior leaflet. Mild, posteriorly directedregurgitation. - Left atrium: The atrium was moderately dilated at 42 ml/m2. - Right atrium: Moderately dilated. - Atrial septum: No defect or patent foramen ovale was identified. - Pericardium, extracardiac: A trivial pericardial effusion wasidentified. Features were not consistent with tamponadephysiology. Impressions: - Compared to the prior echo in 2013, there is now mild aorticstenosis - the pericardial effusion persists.  Antibiotics: Vancomycin and Zosyn 6/5 Levaquin 6/6  Subjective: Patient continues to feel better. Not as short of breath as before. Cough is improving as well. Denies nausea, vomiting.   Objective: Vital Signs  Filed Vitals:   08/11/14 0300 08/11/14 0400 08/11/14 0444 08/11/14 0500  BP: 140/66 136/65  137/65  Pulse: 73 77  73  Temp:  98.1 F (36.7 C)    TempSrc:  Axillary    Resp: 29 24  28   Height:      Weight:   109 kg (240 lb 4.8 oz)   SpO2: 93% 95%  93%    Intake/Output Summary (Last 24 hours) at 08/11/14 0738 Last data filed at 08/11/14 0441  Gross per 24 hour  Intake    600 ml  Output    851 ml  Net   -  251 ml   Filed Weights   08/09/14 1830 08/10/14 0321 08/11/14 0444  Weight: 107.4 kg (236 lb 12.4 oz) 107.9 kg (237 lb 14 oz) 109 kg (240 lb 4.8 oz)    General appearance: alert, cooperative, appears stated age and no distress Resp: Some improvement in her entry, but still diminished at the bases. Few crackles present bilaterally. No wheezing. No rhonchi. Cardio: regular rate and rhythm, S1, S2 normal, no murmur, click,  rub or gallop GI: soft, non-tender; bowel sounds normal; no masses,  no organomegaly Extremities: extremities normal, atraumatic, no cyanosis or edema Neurologic: Alert. No focal neurological deficits. Mild right-sided weakness from previous stroke.  Lab Results:  Basic Metabolic Panel:  Recent Labs Lab 08/09/14 1118 08/10/14 0326 08/11/14 0356  NA 134* 136 137  K 3.7 3.8 3.9  CL 104 108 109  CO2 23 21* 20*  GLUCOSE 90 97 103*  BUN 8 6 14   CREATININE 0.99 1.02 1.19  CALCIUM 8.1* 7.4* 7.8*   Liver Function Tests:  Recent Labs Lab 08/09/14 1118  AST 19  ALT 12*  ALKPHOS 56  BILITOT 0.4  PROT 10.7*  ALBUMIN 2.4*   CBC:  Recent Labs Lab 08/09/14 1118 08/10/14 0326 08/11/14 0356  WBC 7.7 12.6* 9.1  NEUTROABS 4.9  --   --   HGB 9.0* 8.8* 8.8*  HCT 30.6* 29.5* 30.2*  MCV 73.7* 73.0* 73.5*  PLT 111* 103* 98*   BNP (last 3 results)  Recent Labs  08/09/14 1118 08/10/14 1410  BNP 154.8* 1325.5*    CBG:  Recent Labs Lab 08/09/14 1111  GLUCAP 85    Recent Results (from the past 240 hour(s))  Culture, blood (routine x 2)     Status: None (Preliminary result)   Collection Time: 08/09/14 11:15 AM  Result Value Ref Range Status   Specimen Description BLOOD RIGHT ARM  Final   Special Requests BOTTLES DRAWN AEROBIC AND ANAEROBIC 5ML  Final   Culture   Final           BLOOD CULTURE RECEIVED NO GROWTH TO DATE CULTURE WILL BE HELD FOR 5 DAYS BEFORE ISSUING A FINAL NEGATIVE REPORT Performed at [facility]    Report Status PENDING  Incomplete  Urine culture     Status: None   Collection Time: 08/09/14 11:15 AM  Result Value Ref Range Status   Specimen Description URINE, CATHETERIZED  Final   Special Requests NONE  Final   Colony Count NO GROWTH Performed at [facility]   Final   Culture NO GROWTH Performed at [facility]   Final   Report Status 08/10/2014 FINAL  Final  Culture, blood (routine x 2)     Status: None  (Preliminary result)   Collection Time: 08/09/14 11:25 AM  Result Value Ref Range Status   Specimen Description BLOOD RIGHT HAND  Final   Special Requests BOTTLES DRAWN AEROBIC AND ANAEROBIC 5ML  Final   Culture   Final           BLOOD CULTURE RECEIVED NO GROWTH TO DATE CULTURE WILL BE HELD FOR 5 DAYS BEFORE ISSUING A FINAL NEGATIVE REPORT Performed at [facility]    Report Status PENDING  Incomplete  MRSA PCR Screening     Status: None   Collection Time: 08/09/14  7:01 PM  Result Value Ref Range Status   MRSA by PCR NEGATIVE NEGATIVE Final    Comment:        The GeneXpert MRSA  Assay (FDA approved for NASAL specimens only), is one component of a comprehensive MRSA colonization surveillance program. It is not intended to diagnose MRSA infection nor to guide or monitor treatment for MRSA infections.       Studies/Results: X-ray Chest Pa And Lateral  08/10/2014   CLINICAL DATA:  Pneumonia.  Weakness, shortness of breath.  EXAM: CHEST  2 VIEW  COMPARISON:  08/09/2014  FINDINGS: Cardiomegaly with vascular congestion. Increasing interstitial prominence throughout the lungs likely reflects interstitial edema. Bibasilar opacities, left greater than right could reflect atelectasis or infiltrates. Small bilateral effusions, left greater than right. No acute bony abnormality.  IMPRESSION: Increasing interstitial prominence, likely interstitial edema.  Bibasilar atelectasis or infiltrates, left greater than right.  Small bilateral effusions.   Electronically Signed   By: Rolm Baptise M.D.   On: 08/10/2014 10:13   Dg Chest Portable 1 View  08/09/2014   CLINICAL DATA:  Altered mental status.  Prior stroke.  EXAM: PORTABLE CHEST - 1 VIEW  COMPARISON:  02/02/2014  FINDINGS: Normal mediastinum with enlarged cardiac silhouette. There is left pleural effusion. The left base is poorly evaluated. Central venous pulmonary congestion noted.  IMPRESSION: Left pleural effusion and atelectasis. Cannot  exclude left lower lobe pneumonia.   Electronically Signed   By: Suzy Bouchard M.D.   On: 08/09/2014 12:04    Medications:  Scheduled: . antiseptic oral rinse  7 mL Mouth Rinse BID  . aspirin EC  81 mg Oral Daily  . atenolol  25 mg Oral Daily  . atorvastatin  20 mg Oral q1800  . budesonide (PULMICORT) nebulizer solution  0.25 mg Nebulization BID  . diltiazem  180 mg Oral Daily  . docusate sodium  100 mg Oral BID  . feeding supplement (ENSURE ENLIVE)  237 mL Oral BID BM  . furosemide  40 mg Intravenous Once  . guaiFENesin  1,200 mg Oral BID  . latanoprost  1 drop Both Eyes QHS  . levETIRAcetam  500 mg Oral Q12H  . levofloxacin (LEVAQUIN) IV  750 mg Intravenous Q24H  . mirabegron ER  50 mg Oral Daily  . piperacillin-tazobactam (ZOSYN)  IV  3.375 g Intravenous Q8H  . sodium chloride  3 mL Intravenous Q12H  . sodium chloride  3 mL Intravenous Q12H  . vancomycin  750 mg Intravenous Q12H  . Warfarin - Pharmacist Dosing Inpatient   Does not apply q1800   Continuous:  GEZ:MOQHUT chloride, acetaminophen, albuterol, alum & mag hydroxide-simeth, bisacodyl, HYDROcodone-acetaminophen, ipratropium-albuterol, ondansetron **OR** ondansetron (ZOFRAN) IV, sodium chloride  Assessment/Plan:  Principal Problem:   Sepsis Active Problems:   Myelodysplastic syndrome   Dyslipidemia, goal LDL below 70   Essential hypertension   DM type 2 (diabetes mellitus, type 2)   Diastolic dysfunction: Grade 2   Thrombocytopenia, chronic   PAF (paroxysmal atrial fibrillation)   CAP (community acquired pneumonia)   Left ventricular diastolic dysfunction, NYHA class 2 (grade 2 dysfunction with elevated filling pressures)   Altered mental status   Microcytic anemia    Sepsis/bacteremia Thought to be secondary to left lower lobe pneumonia with pleural effusion. Continue Levaquin, Vanco and Zosyn. One set of blood cultures is growing gram-positive rods. Final identification and sensitivities pending. Patient  received 2.5 L of fluid in the emergency department. Lactic acid is normal.  Fever with questionable pneumonia with pleural effusion  Repeat chest x-ray shows an element of fluid overload. He did get a lot of fluids in the emergency department and does have a history of  diastolic CHF. However, at the same time, I do feel that predominantly he does have an infectious etiology for his presentation. BNP noted to be elevated. He'll be given a dose of Lasix. Fever appears to have subsided. Urine for streptococcus and legionella antigens are negative. HIV is nonreactive. Blood culture as above.    Altered mental status Likely due to acute infection in the setting of chronic deficit from previous stroke. Seems to be back to baseline..  Diastolic dysfunction/chronic diastolic congestive heart failure Noted grade 2 on a 2013 2-D echo. Repeat 2-D echocardiogram is as above. Chest x-ray suggests fluid overload. Patient did get a lot of fluids in the emergency department. He was given 1 dose of IV Lasix. Resume oral Lasix from tomorrow.   Paroxysmal atrial fibrillation  on chronic Coumadin, atenolol, diltiazem. Continue for now. INR noted to be supratherapeutic. Pharmacy is managing warfarin.  History of coronary artery disease status post stent placement. Continue aspirin in addition to Coumadin.  Microcytic anemia and thrombocytopenia It appears to have developed slowly over the course of the past year. The patient sees Dr. Alvy Bimler for myelodysplastic syndrome. Of note he has hemoglobinuria and proteinuria.  Diabetes mellitus, type II Patient carries this diagnosis however he does not appear to be on any medications at home. Monitor blood sugars.   DVT Prophylaxis: On warfarin    Code Status: Full code  Family Communication: Discussed with the patient and his significant other  Disposition Plan: Okay to transfer to floor. PT OT to evaluate.    LOS: 2 days   [place]  Hospitalists Pager [phone] 08/11/2014, 7:38 AM  If 7PM-7AM, please contact night-coverage at [email or web address], password [facility]

## 2014-08-11 NOTE — Progress Notes (Signed)
Report called to receiving nurse. Patient being transferred to 6E07. Family informed of room number.

## 2014-08-11 NOTE — Progress Notes (Signed)
ANTICOAGULATION CONSULT NOTE - Follow-up  Pharmacy Consult for Coumadin Indication: atrial fibrillation  No Known Allergies  Patient Measurements: Height: 5\' 5"  (165.1 cm) Weight: 240 lb 4.8 oz (109 kg) IBW/kg (Calculated) : 61.5  Vital Signs: Temp: 98.4 F (36.9 C) (06/07 0825) Temp Source: Oral (06/07 0825) BP: 153/82 mmHg (06/07 0825) Pulse Rate: 84 (06/07 0825)  Labs:  Recent Labs  08/09/14 1118 08/10/14 0326 08/11/14 0356  HGB 9.0* 8.8* 8.8*  HCT 30.6* 29.5* 30.2*  PLT 111* 103* 98*  LABPROT 28.2* 30.5* 37.5*  INR 2.69* 2.99* 3.93*  CREATININE 0.99 1.02 1.19    Estimated Creatinine Clearance: 53.6 mL/min (by C-G formula based on Cr of 1.19).  Medications:  Per anticoag clinic visit on 5/16 - Coumadin 5mg  on Tues, Thurs, Sat; 7.5mg  all other days  Assessment: 81 year old male admitted with sepsis. He is on chronic anticoagulation with Coumadin for atrial fibrillation. His INR is now supratherapeutic, potentially related to interaction with levaquin. CBC is stable and no bleeding noted.   Goal of Therapy:  INR 2-3   Plan:  - No warfarin tonight - Daily INR  Salome Arnt, PharmD, BCPS Pager # [phone] 08/11/2014 10:40 AM

## 2014-08-11 NOTE — Clinical Social Work Note (Signed)
Clinical Social Work Assessment  Patient Details  Name: Terry Robertson. MRN: [id number] Date of Birth: Aug 25, 1928  Date of referral:  08/11/14               Reason for consult:  Facility Placement                Permission sought to share information with:  Family Supports Permission granted to share information::  Yes, Verbal Permission Granted  Name::     [place]::     Relationship::  girlfriend  Contact Information:     Housing/Transportation Living arrangements for the past 2 months:  Single Family Home Source of Information:  Patient, Friend/Neighbor Patient Interpreter Needed:  None Criminal Activity/Legal Involvement Pertinent to Current Situation/Hospitalization:  No - Comment as needed Significant Relationships:  Significant Other, Adult Children Lives with:  Adult Children Do you feel safe going back to the place where you live?  Yes Need for family participation in patient care:  Yes (Comment)  Care giving concerns:  PT recommending SNF due to [facility] issues   Facilities manager / plan:  CSW spoke with pt ad pt girlfriend at bedside concerning PT recommendation for SNF  Employment status:  Retired Nurse, adult PT Recommendations:  [place] / Referral to community resources:  [place]  Patient/Family's Response to care:  Family is not agreeable to SNF placement at this time.  Pt and family believe that pt has improved significantly since PT eval and that they have sufficient support at home (3 adult children and grandchildren- girlfriend also visits daily)  Patient/Family's Understanding of and Emotional Response to Diagnosis, Current Treatment, and Prognosis: no questions concerning prognosis/treatment at this time.  Pt and family are hopeful for full recovery.  Emotional Assessment Appearance:  Appears younger than stated age Attitude/Demeanor/Rapport:    Affect (typically  observed):  Quiet, Calm, Appropriate Orientation:  Oriented to Self, Oriented to Place, Oriented to  Time, Oriented to Situation Alcohol / Substance use:  Not Applicable Psych involvement (Current and /or in the community):  No (Comment)  Discharge Needs  Concerns to be addressed:  Discharge Planning Concerns Readmission within the last 30 days:  No Current discharge risk:  Physical Impairment Barriers to Discharge:  Continued Medical Work up   [employer], LCSW 08/11/2014, 11:37 AM

## 2014-08-11 NOTE — Care Management Note (Signed)
Case Management Note  Patient Details  Name: Terry Robertson. MRN: [id number] Date of Birth: 16-Jan-1929  Subjective/Objective:     PT/OT recommend SNF for rehab but pt refuses, wants to discharge home with home health services, he and significant other report that he will have 24/7 assistance.  Provided list of home health agencies, referral made to [facility] for RN, PT per choice.                  Expected Discharge Date:                  Expected Discharge Plan:  [place]  In-House Referral:     Discharge planning Services  CM Consult  Post Acute Care Choice:    Choice offered to:     DME Arranged:    DME Agency:     HH Arranged:  RN, PT [place] Agency:  [facility]  Status of Service:  In process, will continue to follow  Medicare Important Message Given:    Date Medicare IM Given:    Medicare IM give by:    Date Additional Medicare IM Given:    Additional Medicare Important Message give by:     If discussed at [place] of Stay Meetings, dates discussed:    Additional Comments:  Terry Cooter, RN 08/11/2014, 2:15 PM

## 2014-08-11 NOTE — Progress Notes (Signed)
Arrival Method: via bed Mental Status: alert and oriented x 4 Telemetry: applied and CCMD notified Skin: see assessment Tubes: n/a IV: Left hand Pain: 0/10 Family: present at bedside Living Situation: home with family  Safety Measures: bed in lowest position, call bell in reach 6E Orientation: oriented to staff and unit  Pt resting comfortably. Will continue to monitor. Tyna Jaksch, RN

## 2014-08-12 ENCOUNTER — Ambulatory Visit: Payer: Commercial Managed Care - HMO | Admitting: Pulmonary Disease

## 2014-08-12 ENCOUNTER — Inpatient Hospital Stay (HOSPITAL_COMMUNITY): Payer: Commercial Managed Care - HMO

## 2014-08-12 DIAGNOSIS — I519 Heart disease, unspecified: Secondary | ICD-10-CM

## 2014-08-12 DIAGNOSIS — I10 Essential (primary) hypertension: Secondary | ICD-10-CM

## 2014-08-12 DIAGNOSIS — I480 Paroxysmal atrial fibrillation: Secondary | ICD-10-CM

## 2014-08-12 DIAGNOSIS — D469 Myelodysplastic syndrome, unspecified: Secondary | ICD-10-CM

## 2014-08-12 DIAGNOSIS — R791 Abnormal coagulation profile: Secondary | ICD-10-CM

## 2014-08-12 DIAGNOSIS — J189 Pneumonia, unspecified organism: Secondary | ICD-10-CM

## 2014-08-12 DIAGNOSIS — A419 Sepsis, unspecified organism: Principal | ICD-10-CM

## 2014-08-12 LAB — CBC
HCT: 29 % — ABNORMAL LOW (ref 39.0–52.0)
HEMOGLOBIN: 8.5 g/dL — AB (ref 13.0–17.0)
MCH: 21.5 pg — ABNORMAL LOW (ref 26.0–34.0)
MCHC: 29.3 g/dL — AB (ref 30.0–36.0)
MCV: 73.4 fL — ABNORMAL LOW (ref 78.0–100.0)
Platelets: 101 10*3/uL — ABNORMAL LOW (ref 150–400)
RBC: 3.95 MIL/uL — ABNORMAL LOW (ref 4.22–5.81)
RDW: 21.1 % — ABNORMAL HIGH (ref 11.5–15.5)
WBC: 5 10*3/uL (ref 4.0–10.5)

## 2014-08-12 LAB — VANCOMYCIN, TROUGH: VANCOMYCIN TR: 12 ug/mL (ref 10.0–20.0)

## 2014-08-12 LAB — CULTURE, BLOOD (ROUTINE X 2)

## 2014-08-12 LAB — BASIC METABOLIC PANEL
ANION GAP: 9 (ref 5–15)
BUN: 11 mg/dL (ref 6–20)
CHLORIDE: 105 mmol/L (ref 101–111)
CO2: 21 mmol/L — ABNORMAL LOW (ref 22–32)
Calcium: 7.8 mg/dL — ABNORMAL LOW (ref 8.9–10.3)
Creatinine, Ser: 1.01 mg/dL (ref 0.61–1.24)
GFR calc Af Amer: >60 mL/min (ref >60)
GFR calc non Af Amer: >60 mL/min (ref >60)
Glucose, Bld: 78 mg/dL (ref 65–99)
POTASSIUM: 3.2 mmol/L — AB (ref 3.5–5.1)
Sodium: 135 mmol/L (ref 135–145)

## 2014-08-12 LAB — PROTIME-INR
INR: 4.28 — ABNORMAL HIGH (ref 0.00–1.49)
PROTHROMBIN TIME: 40 s — AB (ref 11.6–15.2)

## 2014-08-12 NOTE — Procedures (Signed)
Pt placed on blackbox using the automode set to a min-5 and a max-20.  Pt is tolerating well and resting comfortably.

## 2014-08-12 NOTE — Progress Notes (Signed)
ANTICOAGULATION CONSULT NOTE - Follow-up  Pharmacy Consult for Coumadin Indication: atrial fibrillation  No Known Allergies  Patient Measurements: Height: 5\' 5"  (165.1 cm) Weight: 239 lb 6.7 oz (108.6 kg) IBW/kg (Calculated) : 61.5  Vital Signs: Temp: 97.9 F (36.6 C) (06/08 0900) Temp Source: Oral (06/08 0900) BP: 164/76 mmHg (06/08 0900) Pulse Rate: 86 (06/08 0900)  Labs:  Recent Labs  08/10/14 0326 08/11/14 0356 08/12/14 0505  HGB 8.8* 8.8* 8.5*  HCT 29.5* 30.2* 29.0*  PLT 103* 98* 101*  LABPROT 30.5* 37.5* 40.0*  INR 2.99* 3.93* 4.28*  CREATININE 1.02 1.19 1.01    Estimated Creatinine Clearance: 62.9 mL/min (by C-G formula based on Cr of 1.01).  Medications:  Per anticoag clinic visit on 5/16 - Coumadin 5mg  on Tues, Thurs, Sat; 7.5mg  all other days  Assessment: INR has increased to 4.28 today in this 81 year old male admitted with sepsis. Prior to admission he was on chronic anticoagulation with Coumadin for atrial fibrillation.  Supratherapeutic INR, potentially related to interaction with levaquin. CBC is stable and no bleeding noted.   Dr. Erlinda Hong has discontinued the IV vancomycin and zosyn. Continues levofloxacin.    Goal of Therapy:  INR 2-3   Plan:  - No warfarin tonight - Daily INR  Nicole Cella, RPh Clinical Pharmacist Pager: [phone]  08/12/2014 10:44 AM

## 2014-08-12 NOTE — Progress Notes (Signed)
[facility] PROGRESS NOTE  [name]. OZD:[id number] DOB: April 05, 1928 DOA: 08/09/2014  PCP: [name], MD  Brief HPI: 81 year old African-American male with a past medical history of stroke, seizures, myelodysplastic syndrome, congestive heart failure (diastolic), history of obstructive sleep apnea, who presented to the emergency department with a temperature of 103.21F. He also gave a history of cough. His chest x-ray suggested pneumonia in the left lower base. Patient was admitted to the hospital for further management. Patient improved symptomatically. He was subsequently transferred to the floor.  Past medical history:  Past Medical History  Diagnosis Date  . Diabetes mellitus   . Hypertension   . Stroke 08/2011, 03/2012    L MCA (Afib RVR while admitted for hypotension) - expressive aphasia (almost totally recovered)  . Seizures     Post CVA; on Keppra  . OSA on CPAP   . Paroxysmal atrial fibrillation     on coumadin; Diagnosed at the time of his 1st CVA  . Obesity, morbid     BMI  . Heart attack 1997    inferior MI with left circumflex stent  . CAD S/P percutaneous coronary angioplasty 1997    status post remote PCI to the LAD and circumflex in 1997, heart cath in 2008 showed widely patent stents in the circumflex and LAD.  RCA had a mid lesion and then was totally occluded distally.  . Myelodysplastic syndrome     w/mild anemia an neutropenia  . BPH (benign prostatic hyperplasia)     TURP in 12/2010  . CHF (congestive heart failure)   . Asbestosis     family unclear where he was evaluated in the past    Consultants: None  Procedures:   2-D echocardiogram Study Conclusions - Left ventricle: The cavity size was normal. There was moderateconcentric hypertrophy. Systolic function was normal. Theestimated ejection fraction was in the range of 60% to 65%. Wallmotion was normal; there were no regional wall motionabnormalities. Diastolic function cannot be  assessed, however,there is elevated LV filling pressure. - Aortic valve: Moderate calcification of the leaflet bases withrestricted leaflet opening. Mild aortic stenosis. Trileaflet.There was trivial regurgitation. Valve area (VTI): 1.91 cm^2.Valve area (Vmax): 2.04 cm^2. Valve area (Vmean): 1.9 cm^2. - Mitral valve: Mildly thickened leaflets - late systolic prolapseof the anterior leaflet. Mild, posteriorly directedregurgitation. - Left atrium: The atrium was moderately dilated at 42 ml/m2. - Right atrium: Moderately dilated. - Atrial septum: No defect or patent foramen ovale was identified. - Pericardium, extracardiac: A trivial pericardial effusion wasidentified. Features were not consistent with tamponadephysiology. Impressions: - Compared to the prior echo in 2013, there is now mild aorticstenosis - the pericardial effusion persists.  Antibiotics: Vancomycin and Zosyn 6/5 Levaquin 6/6  Subjective: Patient is having bp sitting up on bedside commode, reported feeling better, girlfriend of 8 years at bedside assisting care. Not as short of breath as before. Cough is improving as well. Denies nausea, vomiting. No edema, denies pain. want to keep condom cath in.  Objective: Vital Signs  Filed Vitals:   08/11/14 2049 08/12/14 0500 08/12/14 0900 08/12/14 1024  BP: 159/74 157/74 164/76   Pulse: 80 76 86   Temp: 97.6 F (36.4 C) 97.4 F (36.3 C) 97.9 F (36.6 C)   TempSrc: Oral Oral Oral   Resp: 22 20 20    Height:      Weight: 108.6 kg (239 lb 6.7 oz)     SpO2: 94% 95% 96% 94%    Intake/Output Summary (Last 24 hours) at  08/12/14 1041 Last data filed at 08/12/14 1033  Gross per 24 hour  Intake    750 ml  Output   1702 ml  Net   -952 ml   Filed Weights   08/10/14 0321 08/11/14 0444 08/11/14 2049  Weight: 107.9 kg (237 lb 14 oz) 109 kg (240 lb 4.8 oz) 108.6 kg (239 lb 6.7 oz)    General appearance: alert, cooperative, appears stated age and no distress Resp: Some  improvement in her entry, but still diminished at the bases. Few crackles present bilaterally. No wheezing. No rhonchi. Cardio: regular rate and rhythm, S1, S2 normal, no murmur, click, rub or gallop GI: soft, non-tender; bowel sounds normal; no masses,  no organomegaly Extremities: extremities normal, atraumatic, no cyanosis or edema Neurologic: Alert. No focal neurological deficits. Mild right-sided weakness from previous stroke.  Lab Results:  Basic Metabolic Panel:  Recent Labs Lab 08/09/14 1118 08/10/14 0326 08/11/14 0356 08/12/14 0505  NA 134* 136 137 135  K 3.7 3.8 3.9 3.2*  CL 104 108 109 105  CO2 23 21* 20* 21*  GLUCOSE 90 97 103* 78  BUN 8 6 14 11   CREATININE 0.99 1.02 1.19 1.01  CALCIUM 8.1* 7.4* 7.8* 7.8*   Liver Function Tests:  Recent Labs Lab 08/09/14 1118  AST 19  ALT 12*  ALKPHOS 56  BILITOT 0.4  PROT 10.7*  ALBUMIN 2.4*   CBC:  Recent Labs Lab 08/09/14 1118 08/10/14 0326 08/11/14 0356 08/12/14 0505  WBC 7.7 12.6* 9.1 5.0  NEUTROABS 4.9  --   --   --   HGB 9.0* 8.8* 8.8* 8.5*  HCT 30.6* 29.5* 30.2* 29.0*  MCV 73.7* 73.0* 73.5* 73.4*  PLT 111* 103* 98* 101*   BNP (last 3 results)  Recent Labs  08/09/14 1118 08/10/14 1410  BNP 154.8* 1325.5*    CBG:  Recent Labs Lab 08/09/14 1111  GLUCAP 85    Recent Results (from the past 240 hour(s))  Culture, blood (routine x 2)     Status: None (Preliminary result)   Collection Time: 08/09/14 11:15 AM  Result Value Ref Range Status   Specimen Description BLOOD RIGHT ARM  Final   Special Requests BOTTLES DRAWN AEROBIC AND ANAEROBIC 5ML  Final   Culture   Final    GRAM POSITIVE RODS Note: Gram Stain Report Called to,Read Back By and Verified With: NICOLE W BY INGRAM A 6/7 11AM Performed at [facility]    Report Status PENDING  Incomplete  Urine culture     Status: None   Collection Time: 08/09/14 11:15 AM  Result Value Ref Range Status   Specimen Description URINE,  CATHETERIZED  Final   Special Requests NONE  Final   Colony Count NO GROWTH Performed at [facility]   Final   Culture NO GROWTH Performed at [facility]   Final   Report Status 08/10/2014 FINAL  Final  Culture, blood (routine x 2)     Status: None (Preliminary result)   Collection Time: 08/09/14 11:25 AM  Result Value Ref Range Status   Specimen Description BLOOD RIGHT HAND  Final   Special Requests BOTTLES DRAWN AEROBIC AND ANAEROBIC 5ML  Final   Culture   Final           BLOOD CULTURE RECEIVED NO GROWTH TO DATE CULTURE WILL BE HELD FOR 5 DAYS BEFORE ISSUING A FINAL NEGATIVE REPORT Performed at [facility]    Report Status PENDING  Incomplete  MRSA  PCR Screening     Status: None   Collection Time: 08/09/14  7:01 PM  Result Value Ref Range Status   MRSA by PCR NEGATIVE NEGATIVE Final    Comment:        The GeneXpert MRSA Assay (FDA approved for NASAL specimens only), is one component of a comprehensive MRSA colonization surveillance program. It is not intended to diagnose MRSA infection nor to guide or monitor treatment for MRSA infections.       Studies/Results: No results found.  Medications:  Scheduled: . antiseptic oral rinse  7 mL Mouth Rinse BID  . aspirin EC  81 mg Oral Daily  . atenolol  25 mg Oral Daily  . atorvastatin  20 mg Oral q1800  . budesonide (PULMICORT) nebulizer solution  0.25 mg Nebulization BID  . diltiazem  180 mg Oral Daily  . docusate sodium  100 mg Oral BID  . feeding supplement (ENSURE ENLIVE)  237 mL Oral BID BM  . furosemide  20 mg Oral Daily  . guaiFENesin  1,200 mg Oral BID  . latanoprost  1 drop Both Eyes QHS  . levETIRAcetam  500 mg Oral Q12H  . levofloxacin (LEVAQUIN) IV  750 mg Intravenous Q24H  . mirabegron ER  50 mg Oral Daily  . sodium chloride  3 mL Intravenous Q12H  . Warfarin - Pharmacist Dosing Inpatient   Does not apply q1800   Continuous:  ZOX:WRUEAV chloride, acetaminophen, albuterol,  alum & mag hydroxide-simeth, bisacodyl, HYDROcodone-acetaminophen, ipratropium-albuterol, ondansetron **OR** ondansetron (ZOFRAN) IV, sodium chloride  Assessment/Plan:  Principal Problem:   Sepsis Active Problems:   Myelodysplastic syndrome   Dyslipidemia, goal LDL below 70   Essential hypertension   DM type 2 (diabetes mellitus, type 2)   Diastolic dysfunction: Grade 2   Thrombocytopenia, chronic   PAF (paroxysmal atrial fibrillation)   CAP (community acquired pneumonia)   Left ventricular diastolic dysfunction, NYHA class 2 (grade 2 dysfunction with elevated filling pressures)   Altered mental status   Microcytic anemia    Sepsis/bacteremia ? left lower lobe pneumonia with pleural effusion. One set of blood cultures is growing gram-positive rods. Contamination? Much improved, Continue Levaquin, d/c Vanco and Zosyn.    Fever with questionable pneumonia with pleural effusion   Fever appears to have subsided. Urine for streptococcus and legionella antigens are negative. HIV is nonreactive. Blood culture as above.  -repeat cxr 2view to further evaluate pleural effusion -ambulate to check o2 saturation. -abx adjusted as mentioned above.   Altered mental status Likely due to acute infection in the setting of chronic deficit from previous stroke. Metabolic encephalopathy Seems to be back to baseline..  Diastolic dysfunction/chronic diastolic congestive heart failure Noted grade 2 on a 2013 2-D echo. Repeat 2-D echocardiogram 6/6 adequate LVEF, trivial pericardial effusion, no temponade.  Chest x-ray on admission suggests fluid overload . Patient did get a lot of fluids in the emergency department. He was given 1 dose of IV Lasix on 6/7.  -Resume oral Lasix from 6/8, no edema on exam, repeat cxr two view on 6/8  Paroxysmal atrial fibrillation  on chronic Coumadin, atenolol, diltiazem. Currently sinus rhythm with few pac's, Rate controlled,  INR noted to be supratherapeutic.  Pharmacy is managing warfarin.  History of coronary artery disease status post stent placement. Continue aspirin,  Coumadin held due to supratherapeutic INR.  Microcytic anemia and thrombocytopenia It appears to have developed slowly over the course of the past year. The patient sees Dr. Alvy Bimler for myelodysplastic syndrome. Of  note he has hemoglobinuria and proteinuria. -no indication for transfusion  Diabetes mellitus, type II Patient carries this diagnosis however he does not appear to be on any medications at home. Monitor blood sugars. Check a1c.   DVT Prophylaxis: On warfarin    Code Status: Full code  Family Communication: Discussed with the patient and his significant other  Disposition Plan: PT OT /ambulte and check o2 sats, possible d/c in 1-2 days.    LOS: 3 days   Ellina Sivertsen MD PhD  [facility] Pager [phone] 08/12/2014, 10:41 AM  If 7PM-7AM, please contact night-coverage at [email or web address], password [facility]

## 2014-08-12 NOTE — Progress Notes (Signed)
Occupational Therapy Treatment Patient Details Name: Terry Robertson. MRN: [id number] DOB: Sep 13, 1928 Today's Date: 08/12/2014    History of present illness Pt is an 81 y/o male with a PMH of CVA, seizures, myelodysplastic syndrome, MI, pneumothorax, CHF, asbestosis, DM. Pt presents to the ED with confusion and likely LLL PNA.    OT comments  Pt's girlfriend of 8 years with pt.  Instructed girlfriend to supervise pt closely, but avoid helping him with ADL when unnecessary.  Pt does not perform LB ADL at home, although he has AE, prefers to rely on his son's assist. Pt with improved strength B UEs.  Follow Up Recommendations  No OT follow up;Supervision/Assistance - 24 hour (pt and caregiver are declining SNF and HHOT)    Equipment Recommendations  None recommended by OT    Recommendations for Other Services      Precautions / Restrictions Precautions Precautions: Fall Restrictions Weight Bearing Restrictions: No       Mobility Bed Mobility               General bed mobility comments: pt up in chair  Transfers Overall transfer level: Needs assistance Equipment used: None Transfers: Sit to/from Stand Sit to Stand: Supervision         General transfer comment: no physical assist, used momentum    Balance     Sitting balance-Leahy Scale: Good       Standing balance-Leahy Scale: Fair                     ADL Overall ADL's : Needs assistance/impaired     Grooming: Supervision/safety;Standing;Oral care Grooming Details (indicate cue type and reason): stood x 3 minutes                             Functional mobility during ADLs: Min guard (pt stabilized with IV pole) General ADL Comments: Pt has AE for LB dressing, but he prefers assist of his son      Vision                     Perception     Praxis      Cognition   Behavior During Therapy: [facility] for tasks assessed/performed Overall Cognitive Status: No family/caregiver  present to determine baseline cognitive functioning                       Extremity/Trunk Assessment               Exercises  Performed resistive push-pull and shoulder FF x 10 each.   Shoulder Instructions       General Comments      Pertinent Vitals/ Pain       Pain Assessment: No/denies pain  Home Living Family/patient expects to be discharged to:: Private residence                                        Prior Functioning/Environment              Frequency Min 2X/week     Progress Toward Goals  OT Goals(current goals can now be found in the care plan section)  Progress towards OT goals: Progressing toward goals  Acute Rehab OT Goals Patient Stated Goal: to get stronger Time For Goal Achievement: 08/24/14  Plan Discharge plan  needs to be updated    Co-evaluation                 End of Session     Activity Tolerance Patient tolerated treatment well   Patient Left in chair;with call bell/phone within reach;with family/visitor present   Nurse Communication          Time: 9509-3267 OT Time Calculation (min): 29 min  Charges: OT General Charges $OT Visit: 1 Procedure OT Treatments $Self Care/Home Management : 23-37 mins  Malka So 08/12/2014, 10:24 AM [phone]

## 2014-08-13 ENCOUNTER — Ambulatory Visit: Payer: Commercial Managed Care - HMO | Admitting: Pharmacist Clinician (PhC)/ Clinical Pharmacy Specialist

## 2014-08-13 LAB — COMPREHENSIVE METABOLIC PANEL
ALBUMIN: 1.9 g/dL — AB (ref 3.5–5.0)
ALK PHOS: 44 U/L (ref 38–126)
ALT: 17 U/L (ref 17–63)
ANION GAP: 7 (ref 5–15)
AST: 29 U/L (ref 15–41)
BILIRUBIN TOTAL: 0.3 mg/dL (ref 0.3–1.2)
BUN: 7 mg/dL (ref 6–20)
CHLORIDE: 106 mmol/L (ref 101–111)
CO2: 24 mmol/L (ref 22–32)
Calcium: 8.1 mg/dL — ABNORMAL LOW (ref 8.9–10.3)
Creatinine, Ser: 0.95 mg/dL (ref 0.61–1.24)
GFR calc Af Amer: >60 mL/min (ref >60)
GFR calc non Af Amer: >60 mL/min (ref >60)
Glucose, Bld: 85 mg/dL (ref 65–99)
Potassium: 2.9 mmol/L — ABNORMAL LOW (ref 3.5–5.1)
SODIUM: 137 mmol/L (ref 135–145)
TOTAL PROTEIN: 9.5 g/dL — AB (ref 6.5–8.1)

## 2014-08-13 LAB — CBC
HEMATOCRIT: 29 % — AB (ref 39.0–52.0)
Hemoglobin: 8.6 g/dL — ABNORMAL LOW (ref 13.0–17.0)
MCH: 21.5 pg — ABNORMAL LOW (ref 26.0–34.0)
MCHC: 29.7 g/dL — ABNORMAL LOW (ref 30.0–36.0)
MCV: 72.5 fL — ABNORMAL LOW (ref 78.0–100.0)
Platelets: 116 10*3/uL — ABNORMAL LOW (ref 150–400)
RBC: 4 MIL/uL — AB (ref 4.22–5.81)
RDW: 20.9 % — ABNORMAL HIGH (ref 11.5–15.5)
WBC: 3.7 10*3/uL — AB (ref 4.0–10.5)

## 2014-08-13 LAB — HEMOGLOBIN A1C
HEMOGLOBIN A1C: 5.9 % — AB (ref 4.8–5.6)
Mean Plasma Glucose: 123 mg/dL

## 2014-08-13 LAB — PROTIME-INR
INR: 4.02 — ABNORMAL HIGH (ref 0.00–1.49)
PROTHROMBIN TIME: 38.1 s — AB (ref 11.6–15.2)

## 2014-08-13 LAB — MAGNESIUM: Magnesium: 1.8 mg/dL (ref 1.7–2.4)

## 2014-08-13 MED ORDER — WARFARIN SODIUM 2.5 MG PO TABS
2.5000 mg | ORAL_TABLET | Freq: Once | ORAL | Status: AC
  Administered 2014-08-13: 2.5 mg via ORAL
  Filled 2014-08-13: qty 1

## 2014-08-13 MED ORDER — DOXYCYCLINE HYCLATE 100 MG PO TABS
100.0000 mg | ORAL_TABLET | Freq: Two times a day (BID) | ORAL | Status: DC
  Administered 2014-08-13 – 2014-08-14 (×3): 100 mg via ORAL
  Filled 2014-08-13 (×5): qty 1

## 2014-08-13 MED ORDER — POTASSIUM CHLORIDE CRYS ER 20 MEQ PO TBCR
40.0000 meq | EXTENDED_RELEASE_TABLET | ORAL | Status: AC
  Administered 2014-08-13 (×2): 40 meq via ORAL
  Filled 2014-08-13 (×2): qty 2

## 2014-08-13 NOTE — Care Management Note (Signed)
Case Management Note  Patient Details  Name: Terry Robertson. MRN: [id number] Date of Birth: [birth year]  Subjective/Objective:                 CM following for progression and d/c Planning.   Action/Plan:  Plan is for pt to d/c to home with [facility] services.  Expected Discharge Date:      08/13/2014            Expected Discharge Plan:  [place]  In-House Referral:     Discharge planning Services  CM Consult  Post Acute Care Choice:    Choice offered to:     DME Arranged:    DME Agency:     HH Arranged:  RN, PT [place] Agency:  [place]  Status of Service:  In process, will continue to follow  Medicare Important Message Given:  Yes Date Medicare IM Given:  08/13/14 Medicare IM give by:  Jasmine Pang RN MPH, case manager, [phone] Date Additional Medicare IM Given:    Additional Medicare Important Message give by:     If discussed at [place] of Stay Meetings, dates discussed:    Additional Comments:  Adron Bene, RN 08/13/2014, 11:12 AM

## 2014-08-13 NOTE — Progress Notes (Signed)
Placed pt. On cpap. Pt. Tolerating  Well at this time.

## 2014-08-13 NOTE — Progress Notes (Signed)
Physical Therapy Treatment Patient Details Name: Terry Robertson. MRN: [id number] DOB: [birth year] Today's Date: 08/13/2014    History of Present Illness Pt is an 81 y/o male with a PMH of CVA, seizures, myelodysplastic syndrome, MI, pneumothorax, CHF, asbestosis, DM. Pt presents to the ED with confusion and likely LLL PNA.     PT Comments    Pt is motivated to try to go home and is wanting to see if possible.  Will have stairs to climb to do this but can try in AM tomorrow if possible.  Was just walking for the first time today.  Will see how practical this idea is but at current level is SNF candidate.  Follow Up Recommendations  SNF     Equipment Recommendations  None recommended by PT    Recommendations for Other Services       Precautions / Restrictions Precautions Precautions: Fall Restrictions Weight Bearing Restrictions: No    Mobility  Bed Mobility               General bed mobility comments: up  when PT arrived  Transfers Overall transfer level: Needs assistance Equipment used: Rolling walker (2 wheeled) Transfers: Sit to/from [facility] Sit to Stand: Min guard;Min assist Stand pivot transfers: Min guard;Min assist       General transfer comment: power up help then to walker  Ambulation/Gait Ambulation/Gait assistance: Min guard;Min assist Ambulation Distance (Feet): 100 Feet Assistive device: Rolling walker (2 wheeled);1 person hand held assist Gait Pattern/deviations: Step-through pattern;Shuffle;Wide base of support;Trunk flexed;Drifts right/left Gait velocity: reduced Gait velocity interpretation: Below normal speed for age/gender General Gait Details: good tolerance for first walk with cues for direction of walker and pacing   Stairs            Wheelchair Mobility    Modified Rankin (Stroke Patients Only)       Balance Overall balance assessment: Needs assistance Sitting-balance support: Feet supported Sitting  balance-Leahy Scale: Good     Standing balance support: Bilateral upper extremity supported Standing balance-Leahy Scale: Fair                      Cognition Arousal/Alertness: Awake/alert Behavior During Therapy: WFL for tasks assessed/performed Overall Cognitive Status: Within Functional Limits for tasks assessed                      Exercises      General Comments General comments (skin integrity, edema, etc.): Pt has limited energy for longer walks but did successfully go down hall and hoping to go straight home from hospital.  Agreed to try stairs tomorrow if able to see how practical home is      Pertinent Vitals/Pain Pain Assessment: No/denies pain    Home Living                      Prior Function            PT Goals (current goals can now be found in the care plan section) Acute Rehab PT Goals Patient Stated Goal: to get stronger Progress towards PT goals: Progressing toward goals    Frequency  Min 3X/week    PT Plan Current plan remains appropriate    Co-evaluation             End of Session Equipment Utilized During Treatment: Gait belt;Oxygen Activity Tolerance: Patient limited by fatigue Patient left: in chair;with call bell/phone within reach;with family/visitor present  Time: 1140-1159 PT Time Calculation (min) (ACUTE ONLY): 19 min  Charges:  $Gait Training: 8-22 mins $Therapeutic Exercise: 8-22 mins                    G Codes:      Ramond Dial Sep 03, 2014, 1:45 PM   Mee Hives, PT MS Acute Rehab Dept. Number: [phone] [phone] and [place] [phone]

## 2014-08-13 NOTE — Progress Notes (Signed)
ANTICOAGULATION CONSULT NOTE - Follow-up  Pharmacy Consult for Coumadin Indication: atrial fibrillation  No Known Allergies  Patient Measurements: Height: 5\' 5"  (165.1 cm) Weight: 231 lb 0.7 oz (104.8 kg) IBW/kg (Calculated) : 61.5  Vital Signs: Temp: 98.1 F (36.7 C) (06/09 1735) Temp Source: Oral (06/09 1735) BP: 156/83 mmHg (06/09 1735) Pulse Rate: 58 (06/09 1735)  Labs:  Recent Labs  08/11/14 0356 08/12/14 0505 08/13/14 0612  HGB 8.8* 8.5* 8.6*  HCT 30.2* 29.0* 29.0*  PLT 98* 101* 116*  LABPROT 37.5* 40.0* 38.1*  INR 3.93* 4.28* 4.02*  CREATININE 1.19 1.01 0.95    Estimated Creatinine Clearance: 65.7 mL/min (by C-G formula based on Cr of 0.95).  Medications:  Per anticoag clinic visit on 5/16 - Coumadin 5mg  on Tues, Thurs, Sat; 7.5mg  all other days  Assessment: INR  4.02, supratherapeutic but has decrease after holding coumadin for past 2 days in this 81 year old male admitted with sepsis. Prior to admission he was on chronic anticoagulation with Coumadin for atrial fibrillation.  Supratherapeutic INR, potentially related to interaction with levaquin. Levaquin discontinued today 6/9.CBC is low but stable and no bleeding noted. Continues on po doxycycline.   His PTA coumadin dose was:  Coumadin 5mg  on Tues, Thurs, Sat; 7.5mg  all other days I will resume coumadin with low dose tonight since INR is trending down after no coumadin x 2 days.   Goal of Therapy:  INR 2-3   Plan:  - Coumadin low dose 2.5 mg tonight - Daily INR  Nicole Cella, RPh Clinical Pharmacist Pager: [phone]  08/13/2014 5:39 PM

## 2014-08-13 NOTE — Progress Notes (Addendum)
[facility] PROGRESS NOTE  Terry Robertson. UEA:[id number] DOB: 1928-07-22 DOA: 08/09/2014  PCP: [name], MD  Brief HPI: 81 year old African-American male with a past medical history of stroke, seizures, myelodysplastic syndrome, congestive heart failure (diastolic), history of obstructive sleep apnea, who presented to the emergency department with a temperature of 103.33F. He also gave a history of cough. His chest x-ray suggested pneumonia in the left lower base. Patient was admitted to the hospital for further management. Patient improved symptomatically. He was subsequently transferred to the floor.  Past medical history:  Past Medical History  Diagnosis Date  . Diabetes mellitus   . Hypertension   . Stroke 08/2011, 03/2012    L MCA (Afib RVR while admitted for hypotension) - expressive aphasia (almost totally recovered)  . Seizures     Post CVA; on Keppra  . OSA on CPAP   . Paroxysmal atrial fibrillation     on coumadin; Diagnosed at the time of his 1st CVA  . Obesity, morbid     BMI  . Heart attack 1997    inferior MI with left circumflex stent  . CAD S/P percutaneous coronary angioplasty 1997    status post remote PCI to the LAD and circumflex in 1997, heart cath in 2008 showed widely patent stents in the circumflex and LAD.  RCA had a mid lesion and then was totally occluded distally.  . Myelodysplastic syndrome     w/mild anemia an neutropenia  . BPH (benign prostatic hyperplasia)     TURP in 12/2010  . CHF (congestive heart failure)   . Asbestosis     family unclear where he was evaluated in the past    Consultants: None  Procedures:   2-D echocardiogram Study Conclusions - Left ventricle: The cavity size was normal. There was moderateconcentric hypertrophy. Systolic function was normal. Theestimated ejection fraction was in the range of 60% to 65%. Wallmotion was normal; there were no regional wall motionabnormalities. Diastolic function cannot be  assessed, however,there is elevated LV filling pressure. - Aortic valve: Moderate calcification of the leaflet bases withrestricted leaflet opening. Mild aortic stenosis. Trileaflet.There was trivial regurgitation. Valve area (VTI): 1.91 cm^2.Valve area (Vmax): 2.04 cm^2. Valve area (Vmean): 1.9 cm^2. - Mitral valve: Mildly thickened leaflets - late systolic prolapseof the anterior leaflet. Mild, posteriorly directedregurgitation. - Left atrium: The atrium was moderately dilated at 42 ml/m2. - Right atrium: Moderately dilated. - Atrial septum: No defect or patent foramen ovale was identified. - Pericardium, extracardiac: A trivial pericardial effusion wasidentified. Features were not consistent with tamponadephysiology. Impressions: - Compared to the prior echo in 2013, there is now mild aorticstenosis - the pericardial effusion persists.  Antibiotics: Vancomycin and Zosyn 6/5 Levaquin 6/6  Subjective: Continue to feel better, less cough,  Reported not feeling confused any more. Am lab showed Significant hypokalemia    Objective: Vital Signs  Filed Vitals:   08/12/14 2153 08/12/14 2226 08/13/14 0500 08/13/14 0855  BP:  167/75 165/75 161/81  Pulse:  74 70 58  Temp:  98.3 F (36.8 C) 97.6 F (36.4 C) 97.4 F (36.3 C)  TempSrc:  Oral Oral Oral  Resp:  18 18 18   Height:      Weight:  104.8 kg (231 lb 0.7 oz)    SpO2: 98% 95% 94% 95%    Intake/Output Summary (Last 24 hours) at 08/13/14 1054 Last data filed at 08/13/14 0915  Gross per 24 hour  Intake    740 ml  Output   1151  ml  Net   -411 ml   Filed Weights   08/11/14 0444 08/11/14 2049 08/12/14 2226  Weight: 109 kg (240 lb 4.8 oz) 108.6 kg (239 lb 6.7 oz) 104.8 kg (231 lb 0.7 oz)    General appearance: alert, cooperative, appears stated age and no distress Resp: Some improvement in her entry, but still diminished at the bases. No crackles today, No wheezing. No rhonchi. Cardio: regular rate and rhythm, S1, S2  normal, no murmur, click, rub or gallop GI: soft, non-tender; bowel sounds normal; no masses,  no organomegaly Extremities: extremities normal, atraumatic, no cyanosis or edema Neurologic: Alert. No focal neurological deficits. Mild right-sided weakness from previous stroke. Not oriented to year/month reported at baseline.  Lab Results:  Basic Metabolic Panel:  Recent Labs Lab 08/09/14 1118 08/10/14 0326 08/11/14 0356 08/12/14 0505 08/13/14 0602 08/13/14 0612  NA 134* 136 137 135  --  137  K 3.7 3.8 3.9 3.2*  --  2.9*  CL 104 108 109 105  --  106  CO2 23 21* 20* 21*  --  24  GLUCOSE 90 97 103* 78  --  85  BUN 8 6 14 11   --  7  CREATININE 0.99 1.02 1.19 1.01  --  0.95  CALCIUM 8.1* 7.4* 7.8* 7.8*  --  8.1*  MG  --   --   --   --  1.8  --    Liver Function Tests:  Recent Labs Lab 08/09/14 1118 08/13/14 0612  AST 19 29  ALT 12* 17  ALKPHOS 56 44  BILITOT 0.4 0.3  PROT 10.7* 9.5*  ALBUMIN 2.4* 1.9*   CBC:  Recent Labs Lab 08/09/14 1118 08/10/14 0326 08/11/14 0356 08/12/14 0505 08/13/14 0612  WBC 7.7 12.6* 9.1 5.0 3.7*  NEUTROABS 4.9  --   --   --   --   HGB 9.0* 8.8* 8.8* 8.5* 8.6*  HCT 30.6* 29.5* 30.2* 29.0* 29.0*  MCV 73.7* 73.0* 73.5* 73.4* 72.5*  PLT 111* 103* 98* 101* 116*   BNP (last 3 results)  Recent Labs  08/09/14 1118 08/10/14 1410  BNP 154.8* 1325.5*    CBG:  Recent Labs Lab 08/09/14 1111  GLUCAP 85    Recent Results (from the past 240 hour(s))  Culture, blood (routine x 2)     Status: None   Collection Time: 08/09/14 11:15 AM  Result Value Ref Range Status   Specimen Description BLOOD RIGHT ARM  Final   Special Requests BOTTLES DRAWN AEROBIC AND ANAEROBIC 5ML  Final   Culture   Final    DIPHTHEROIDS(CORYNEBACTERIUM SPECIES) Note: Standardized susceptibility testing for this organism is not available. Note: Gram Stain Report Called to,Read Back By and Verified With: NICOLE W BY INGRAM A 6/7 11AM Performed at [facility]    Report Status 08/12/2014 FINAL  Final  Urine culture     Status: None   Collection Time: 08/09/14 11:15 AM  Result Value Ref Range Status   Specimen Description URINE, CATHETERIZED  Final   Special Requests NONE  Final   Colony Count NO GROWTH Performed at [facility]   Final   Culture NO GROWTH Performed at [facility]   Final   Report Status 08/10/2014 FINAL  Final  Culture, blood (routine x 2)     Status: None (Preliminary result)   Collection Time: 08/09/14 11:25 AM  Result Value Ref Range Status   Specimen Description BLOOD RIGHT HAND  Final  Special Requests BOTTLES DRAWN AEROBIC AND ANAEROBIC 5ML  Final   Culture   Final           BLOOD CULTURE RECEIVED NO GROWTH TO DATE CULTURE WILL BE HELD FOR 5 DAYS BEFORE ISSUING A FINAL NEGATIVE REPORT Performed at [facility]    Report Status PENDING  Incomplete  MRSA PCR Screening     Status: None   Collection Time: 08/09/14  7:01 PM  Result Value Ref Range Status   MRSA by PCR NEGATIVE NEGATIVE Final    Comment:        The GeneXpert MRSA Assay (FDA approved for NASAL specimens only), is one component of a comprehensive MRSA colonization surveillance program. It is not intended to diagnose MRSA infection nor to guide or monitor treatment for MRSA infections.       Studies/Results: Dg Chest 2 View  08/12/2014   CLINICAL DATA:  Cough and pleural effusion  EXAM: CHEST - 2 VIEW  COMPARISON:  08/10/2014  FINDINGS: Cardiac shadow remains enlarged. A small left-sided pleural effusion is noted. No definitive right effusion is seen. Left basilar atelectasis is noted.  IMPRESSION: Persistent left basilar changes. Improved aeration in the right base is noted.   Electronically Signed   By: Inez Catalina M.D.   On: 08/12/2014 12:23    Medications:  Scheduled: . antiseptic oral rinse  7 mL Mouth Rinse BID  . aspirin EC  81 mg Oral Daily  . atenolol  25 mg Oral Daily  . atorvastatin  20 mg Oral  q1800  . budesonide (PULMICORT) nebulizer solution  0.25 mg Nebulization BID  . diltiazem  180 mg Oral Daily  . docusate sodium  100 mg Oral BID  . feeding supplement (ENSURE ENLIVE)  237 mL Oral BID BM  . furosemide  20 mg Oral Daily  . guaiFENesin  1,200 mg Oral BID  . latanoprost  1 drop Both Eyes QHS  . levETIRAcetam  500 mg Oral Q12H  . levofloxacin (LEVAQUIN) IV  750 mg Intravenous Q24H  . mirabegron ER  50 mg Oral Daily  . potassium chloride  40 mEq Oral Q4H  . sodium chloride  3 mL Intravenous Q12H  . Warfarin - Pharmacist Dosing Inpatient   Does not apply q1800   Continuous:  MHD:QQIWLN chloride, acetaminophen, albuterol, alum & mag hydroxide-simeth, bisacodyl, HYDROcodone-acetaminophen, ipratropium-albuterol, ondansetron **OR** ondansetron (ZOFRAN) IV, sodium chloride  Assessment/Plan:  Principal Problem:   Sepsis Active Problems:   Myelodysplastic syndrome   Dyslipidemia, goal LDL below 70   Essential hypertension   DM type 2 (diabetes mellitus, type 2)   Diastolic dysfunction: Grade 2   Thrombocytopenia, chronic   PAF (paroxysmal atrial fibrillation)   CAP (community acquired pneumonia)   Left ventricular diastolic dysfunction, NYHA class 2 (grade 2 dysfunction with elevated filling pressures)   Altered mental status   Microcytic anemia    Sepsis/bacteremia ? left lower lobe pneumonia with pleural effusion. One set of blood cultures is growing gram-positive rods, likely Contamination Much improved, Continue Levaquin, d/c Vanco and Zosyn.   abx changed to doxycycline on 6/9 due to concerning seizure side effect of levaquin, patient does has history of seizure.  Fever with questionable pneumonia with pleural effusion   Fever appears to have subsided. Urine for streptococcus and legionella antigens are negative. HIV is nonreactive. Blood culture as above.  -repeat cxr 2view to further evaluate pleural effusion -ambulate to check o2 saturation, awaiting  result. -abx adjusted as mentioned above.  Altered mental status Likely due to acute infection in the setting of chronic deficit from previous stroke. Metabolic encephalopathy Seems to be back to baseline..  Diastolic dysfunction/chronic diastolic congestive heart failure Noted grade 2 on a 2013 2-D echo. Repeat 2-D echocardiogram 6/6 adequate LVEF, trivial pericardial effusion, no temponade.  Chest x-ray on admission suggests fluid overload . Patient did get a lot of fluids in the emergency department. He was given 1 dose of IV Lasix on 6/7.  -Resume oral Lasix from 6/8, no edema on exam, repeat cxr two view on 6/8: Persistent left basilar changes. Improved aeration in the right base is noted  Paroxysmal atrial fibrillation  on chronic Coumadin, atenolol, diltiazem. Currently sinus rhythm with few pac's, Rate controlled,  INR noted to be supratherapeutic. Pharmacy is managing warfarin.  History of coronary artery disease status post stent placement. Continue aspirin,  Coumadin held due to supratherapeutic INR.  Microcytic anemia and thrombocytopenia It appears to have developed slowly over the course of the past year. The patient sees Dr. Alvy Bimler for myelodysplastic syndrome. Of note he has hemoglobinuria and proteinuria. Negative FOBT. -no indication for transfusion  Diabetes mellitus, type II Patient carries this diagnosis however he does not appear to be on any medications at home. Monitor blood sugars.  a1c 5.9  Hypokalemia: cause? only on low dose lasix, no diarrhea per report, no n/v, poor oral intake? Replace, repeat labs.  DVT Prophylaxis: On warfarin    Code Status: Full code  Family Communication: Discussed with the patient and his significant other  Disposition Plan: PT OT /ambulte and check o2 sats, possible d/c in 1-2 days.    LOS: 4 days   Nikeshia Keetch MD PhD  [facility] Pager [phone] 08/13/2014, 10:54 AM  If 7PM-7AM, please contact night-coverage at  [email or web address], password [email or web address]

## 2014-08-14 DIAGNOSIS — Z7901 Long term (current) use of anticoagulants: Secondary | ICD-10-CM | POA: Insufficient documentation

## 2014-08-14 DIAGNOSIS — G40909 Epilepsy, unspecified, not intractable, without status epilepticus: Secondary | ICD-10-CM

## 2014-08-14 DIAGNOSIS — IMO0001 Reserved for inherently not codable concepts without codable children: Secondary | ICD-10-CM | POA: Insufficient documentation

## 2014-08-14 DIAGNOSIS — G9341 Metabolic encephalopathy: Secondary | ICD-10-CM

## 2014-08-14 LAB — CBC
HCT: 30.4 % — ABNORMAL LOW (ref 39.0–52.0)
Hemoglobin: 8.7 g/dL — ABNORMAL LOW (ref 13.0–17.0)
MCH: 20.9 pg — ABNORMAL LOW (ref 26.0–34.0)
MCHC: 28.6 g/dL — ABNORMAL LOW (ref 30.0–36.0)
MCV: 73.1 fL — AB (ref 78.0–100.0)
PLATELETS: 122 10*3/uL — AB (ref 150–400)
RBC: 4.16 MIL/uL — AB (ref 4.22–5.81)
RDW: 21 % — ABNORMAL HIGH (ref 11.5–15.5)
WBC: 3.8 10*3/uL — AB (ref 4.0–10.5)

## 2014-08-14 LAB — COMPREHENSIVE METABOLIC PANEL
ALT: 18 U/L (ref 17–63)
ANION GAP: 8 (ref 5–15)
AST: 29 U/L (ref 15–41)
Albumin: 2.2 g/dL — ABNORMAL LOW (ref 3.5–5.0)
Alkaline Phosphatase: 47 U/L (ref 38–126)
BUN: 7 mg/dL (ref 6–20)
CALCIUM: 8.3 mg/dL — AB (ref 8.9–10.3)
CHLORIDE: 109 mmol/L (ref 101–111)
CO2: 21 mmol/L — AB (ref 22–32)
CREATININE: 0.98 mg/dL (ref 0.61–1.24)
GFR calc non Af Amer: >60 mL/min (ref >60)
Glucose, Bld: 85 mg/dL (ref 65–99)
Potassium: 3.3 mmol/L — ABNORMAL LOW (ref 3.5–5.1)
SODIUM: 138 mmol/L (ref 135–145)
Total Bilirubin: 0.5 mg/dL (ref 0.3–1.2)
Total Protein: 9.7 g/dL — ABNORMAL HIGH (ref 6.5–8.1)

## 2014-08-14 LAB — BRAIN NATRIURETIC PEPTIDE: B Natriuretic Peptide: 263.4 pg/mL — ABNORMAL HIGH (ref 0.0–100.0)

## 2014-08-14 LAB — PROTIME-INR
INR: 4.24 — ABNORMAL HIGH (ref 0.00–1.49)
PROTHROMBIN TIME: 39.7 s — AB (ref 11.6–15.2)

## 2014-08-14 LAB — MAGNESIUM: MAGNESIUM: 1.9 mg/dL (ref 1.7–2.4)

## 2014-08-14 MED ORDER — DOXYCYCLINE HYCLATE 100 MG PO TABS: 100.0000 mg | ORAL_TABLET | Freq: Two times a day (BID) | ORAL | Status: DC

## 2014-08-14 MED ORDER — ENSURE ENLIVE PO LIQD: 237.0000 mL | Freq: Two times a day (BID) | ORAL | Status: AC

## 2014-08-14 MED ORDER — WARFARIN SODIUM 5 MG PO TABS: 5.0000 mg | ORAL_TABLET | Freq: Every day | ORAL | Status: DC

## 2014-08-14 MED ORDER — POTASSIUM CHLORIDE CRYS ER 20 MEQ PO TBCR
40.0000 meq | EXTENDED_RELEASE_TABLET | Freq: Once | ORAL | Status: AC
  Administered 2014-08-14: 40 meq via ORAL
  Filled 2014-08-14: qty 2

## 2014-08-14 NOTE — Progress Notes (Signed)
ANTICOAGULATION CONSULT NOTE - Follow-up  Pharmacy Consult for Coumadin Indication: atrial fibrillation  No Known Allergies  Patient Measurements: Height: 5\' 5"  (165.1 cm) Weight: 231 lb 0.7 oz (104.8 kg) IBW/kg (Calculated) : 61.5  Vital Signs: Temp: 98.5 F (36.9 C) (06/10 0900) Temp Source: Oral (06/10 0900) BP: 166/91 mmHg (06/10 0900) Pulse Rate: 66 (06/10 0900)  Labs:  Recent Labs  08/12/14 0505 08/13/14 0612 08/14/14 0735  HGB 8.5* 8.6* 8.7*  HCT 29.0* 29.0* 30.4*  PLT 101* 116* 122*  LABPROT 40.0* 38.1* 39.7*  INR 4.28* 4.02* 4.24*  CREATININE 1.01 0.95 0.98    Estimated Creatinine Clearance: 63.7 mL/min (by C-G formula based on Cr of 0.98).  Medications:  Per anticoag clinic visit on 5/16 - Coumadin 5mg  on Tues, Thurs, Sat; 7.5mg  all other days  Assessment: INR  4.24 up from 4.02, remains supratherapeutic  After coumadin dose held on 6/8 and 6/9 and lower dose given given yesterday. This is a 81 year old male admitted with sepsis. Prior to admission he was on chronic anticoagulation with Coumadin for atrial fibrillation.  Supratherapeutic INR, potentially related to interaction with levaquin. Levaquin discontinued on 6/9.CBC is low but stable and no bleeding noted. Continues on po doxycycline.   His PTA coumadin dose was:  Coumadin 5mg  on Tues, Thurs, Sat; 7.5mg  all other days  Dr. Erlinda Hong plans to discharge patient today.  No coumadin today, no coumadin Saturday 6/11. Resume coumadin on Sunday 6/12 with 5mg  dose then check INR per primary MD on Mon 08/17/14   Goal of Therapy:  INR 2-3   Plan:  Dr. Erlinda Hong plans to discharge patient today.  No coumadin today, no coumadin Saturday 6/11. Resume coumadin on Sunday 6/12 with 5mg  dose then check INR per primary MD on Mon 08/17/14   Nicole Cella, RPh Clinical Pharmacist Pager: [phone]  08/14/2014 12:44 PM

## 2014-08-14 NOTE — Progress Notes (Addendum)
Patient Discharge: Disposition: Patient is discharged to home with home health. Education:  Reviewed about medications, prescriptions, follow-up appointments, and discharge instructions, understood and acknowledged. IV: Discontinued before discharge. Telemetry: Discontinued before discharge, CCMD notified. Transportation: Patient transported in w/c with staff and family accompanying. Belongings: Patient took all his belongings with him.

## 2014-08-14 NOTE — Progress Notes (Signed)
Alerted donna w [facility] that pt dc home today.

## 2014-08-14 NOTE — Progress Notes (Signed)
Patient ambulated in the hall way with no SOB or any dizziness, PO2 was between 94%-100%.

## 2014-08-14 NOTE — Discharge Summary (Signed)
Discharge Summary  Terry Robertson. VOJ:[id number] DOB: January 18, 1929  PCP: [name], MD  Admit date: 08/09/2014 Discharge date: 08/14/2014  Time spent: >60mins  Recommendations for Outpatient Follow-up:  1. F/u with PMD within one week for hospital discharge follow up 2. pmd to monitor inr/coumdin dosing,  3. pmd to repeat cbc/bmp, replace potassium if needed. Monitor blood sugar control, continue adjust insulin  Discharge Diagnoses:  Active Hospital Problems   Diagnosis Date Noted  . Sepsis 08/09/2014  . Altered mental status 08/09/2014  . Microcytic anemia 08/09/2014  . Left ventricular diastolic dysfunction, NYHA class 2 (grade 2 dysfunction with elevated filling pressures) 10/01/2013  . CAP (community acquired pneumonia) 06/21/2013  . PAF (paroxysmal atrial fibrillation) 03/09/2013  . Thrombocytopenia, chronic 08/07/2011  . Diastolic dysfunction: Grade 2 08/07/2011  . Essential hypertension 08/05/2011  . Myelodysplastic syndrome 08/05/2011  . Dyslipidemia, goal LDL below 70 08/05/2011  . DM type 2 (diabetes mellitus, type 2) 08/05/2011    Resolved Hospital Problems   Diagnosis Date Noted Date Resolved  No resolved problems to display.    Discharge Condition: stable  Diet recommendation: heart healthy/carb modified  Filed Weights   08/11/14 0444 08/11/14 2049 08/12/14 2226  Weight: 109 kg (240 lb 4.8 oz) 108.6 kg (239 lb 6.7 oz) 104.8 kg (231 lb 0.7 oz)    History of present illness:  Terry Robertson is a 81 y.o. male, with a past medical history of CVA, seizures, myelodysplastic syndrome, MI, pneumothorax, CHF, asbestosis, diabetes and obstructive sleep apnea. He presents to the emergency department today with a temperature of 103.1, confusion, and likely left lower lobe pneumonia. The patient is unable to give much history as he has confusion and a deficit from previous stroke. Since his previous stroke he has been slow to answer questions, and often stutters. His  girlfriend is at bedside. She reports that he was feeling well 2 days ago but became sleepier than normal. Yesterday he slept most of the day. This morning when he woke he felt very weak he got out of bed but was too weak to eat. His son called 61. He was brought to the ER and found to have a temperature of 103.1, respirations of 34 and a pulse rate of 114. His lactic acid is not elevated. His hemoglobin is currently 9.0 with an MCV value of 73.7. His baseline hemoglobin appears to be approximately 11.  Hospital Course:  Principal Problem:   Sepsis Active Problems:   Myelodysplastic syndrome   Dyslipidemia, goal LDL below 70   Essential hypertension   DM type 2 (diabetes mellitus, type 2)   Diastolic dysfunction: Grade 2   Thrombocytopenia, chronic   PAF (paroxysmal atrial fibrillation)   CAP (community acquired pneumonia)   Left ventricular diastolic dysfunction, NYHA class 2 (grade 2 dysfunction with elevated filling pressures)   Altered mental status   Microcytic anemia  Sepsis/bacteremia ? left lower lobe pneumonia with pleural effusion. One set of blood cultures is growing gram-positive rods, likely Contamination Much improved, Continue Levaquin, d/c Vanco and Zosyn.  abx changed to doxycycline on 6/9 due to concerning seizure side effect of levaquin, patient does has history of seizure.  Fever with questionable pneumonia with pleural effusion  Fever appears to have subsided. Urine for streptococcus and legionella antigens are negative. HIV is nonreactive. Blood culture as above.  -repeat cxr 2view to further evaluate pleural effusion -ambulate to check o2 saturation, awaiting result. -abx adjusted as mentioned above.   Altered mental status Likely due  to acute infection in the setting of chronic deficit from previous stroke. Metabolic encephalopathy Resolved, back to baseline..  Diastolic dysfunction/chronic diastolic congestive heart failure Noted grade 2 on a 2013 2-D  echo. Repeat 2-D echocardiogram 6/6 adequate LVEF, trivial pericardial effusion, no temponade.  Chest x-ray on admission suggests fluid overload . Patient did get a lot of fluids in the emergency department. He was given 1 dose of IV Lasix on 6/7.  -Resume oral Lasix from 6/8, no edema on exam, repeat cxr two view on 6/8: Persistent left basilar changes. Improved aeration in the right base is noted  Paroxysmal atrial fibrillation  on chronic Coumadin, atenolol, diltiazem. Currently sinus rhythm with few pac's, Rate controlled,  INR noted to be supratherapeutic. coumadin held in the hospital, to restart in two days, close follow up with coumadin clinic.  History of coronary artery disease status post stent placement. Continue aspirin, Coumadin held due to supratherapeutic INR. Restart in two days, close follow up with coumadin clinic.  Microcytic anemia and thrombocytopenia It appears to have developed slowly over the course of the past year. The patient sees Dr. Alvy Bimler for myelodysplastic syndrome. Of note he has hemoglobinuria and proteinuria. Negative FOBT. -no indication for transfusion  Diabetes mellitus, type II Patient carries this diagnosis however he does not appear to be on any medications at home. Monitor blood sugars. a1c 5.9  Hypokalemia: cause? only on low dose lasix, no diarrhea per report, no n/v, poor oral intake? Replace, repeat labs improved, pmd to continue monitor.  DVT Prophylaxis: On warfarin  Code Status: Full code  Family Communication: Discussed with the patient and his significant other  Disposition Plan:home with home health.   Discharge Exam: BP 166/91 mmHg  Pulse 66  Temp(Src) 98.5 F (36.9 C) (Oral)  Resp 18  Ht 5\' 5"  (1.651 m)  Wt 104.8 kg (231 lb 0.7 oz)  BMI 38.45 kg/m2  SpO2 98%  General appearance: alert, cooperative, appears stated age and no distress Resp: improvement in air entry, but still diminished at left base. intermittent  basilar crackles ,clears after deep breath, No wheezing. No rhonchi. Cardio: regular rate and rhythm, S1, S2 normal, no murmur, click, rub or gallop GI: soft, non-tender; bowel sounds normal; no masses, no organomegaly Extremities: extremities normal, atraumatic, no cyanosis or edema Neurologic: Alert. No focal neurological deficits. Mild right-sided weakness from previous stroke. Not oriented to year/month reported at baseline.   Discharge Instructions You were cared for by a hospitalist during your hospital stay. If you have any questions about your discharge medications or the care you received while you were in the hospital after you are discharged, you can call the unit and asked to speak with the hospitalist on call if the hospitalist that took care of you is not available. Once you are discharged, your primary care physician will handle any further medical issues. Please note that NO REFILLS for any discharge medications will be authorized once you are discharged, as it is imperative that you return to your primary care physician (or establish a relationship with a primary care physician if you do not have one) for your aftercare needs so that they can reassess your need for medications and monitor your lab values.      Discharge Instructions    Face-to-face encounter (required for Medicare/Medicaid patients)    Complete by:  As directed   I Emmily Pellegrin certify that this patient is under my care and that I, or a nurse practitioner or physician's assistant working  with me, had a face-to-face encounter that meets the physician face-to-face encounter requirements with this patient on 08/14/2014. The encounter with the patient was in whole, or in part for the following medical condition(s) which is the primary reason for home health care (List medical condition): FTT/sepsis  The encounter with the patient was in whole, or in part, for the following medical condition, which is the primary reason for  home health care:  FTT  I certify that, based on my findings, the following services are medically necessary home health services:   Nursing Physical therapy    Reason for Medically Necessary Home Health Services:  Skilled Nursing- Change/Decline in Patient Status  My clinical findings support the need for the above services:  Unsafe ambulation due to balance issues  Further, I certify that my clinical findings support that this patient is homebound due to:  Shortness of Breath with activity     Home Health    Complete by:  As directed   To provide the following care/treatments:   PT RN    RN to check INR on Monday, report result to pmd            Medication List    STOP taking these medications        HYDROcodone-acetaminophen 5-325 MG per tablet  Commonly known as:  NORCO/VICODIN      TAKE these medications        albuterol 108 (90 BASE) MCG/ACT inhaler  Commonly known as:  PROVENTIL HFA;VENTOLIN HFA  Inhale 2 puffs into the lungs every 6 (six) hours as needed for wheezing or shortness of breath.     aspirin EC 81 MG tablet  Take 81 mg by mouth daily.     atenolol 25 MG tablet  Commonly known as:  TENORMIN  Take 1 tablet (25 mg total) by mouth daily.     atorvastatin 20 MG tablet  Commonly known as:  LIPITOR  Take 20 mg by mouth daily.     bisacodyl 5 MG EC tablet  Commonly known as:  DULCOLAX  Take 5 mg by mouth daily as needed for moderate constipation.     diltiazem 180 MG 24 hr capsule  Commonly known as:  DILACOR XR  Take 180 mg by mouth daily.     doxycycline 100 MG tablet  Commonly known as:  VIBRA-TABS  Take 1 tablet (100 mg total) by mouth 2 (two) times daily.     feeding supplement (ENSURE ENLIVE) Liqd  Take 237 mLs by mouth 2 (two) times daily between meals.     Fish Oil 1000 MG Caps  Take 1,000 mg by mouth daily.     furosemide 20 MG tablet  Commonly known as:  LASIX  Take 20 mg by mouth daily.     gabapentin 100 MG capsule  Commonly  known as:  NEURONTIN  Take 100 mg by mouth daily.     latanoprost 0.005 % ophthalmic solution  Commonly known as:  XALATAN  Place 1 drop into both eyes at bedtime.     levETIRAcetam 500 MG tablet  Commonly known as:  KEPPRA  Take 1 tablet (500 mg total) by mouth every 12 (twelve) hours.     MYRBETRIQ 50 MG Tb24 tablet  Generic drug:  mirabegron ER  Take 50 mg by mouth daily.     Icosapent Ethyl 1 G Caps  Take 1 g by mouth daily.     VASCEPA 1 G Caps  Generic drug:  Icosapent  Ethyl  Take 1 g by mouth.     Vitamin D (Ergocalciferol) 50000 UNITS Caps capsule  Commonly known as:  DRISDOL  Take 50,000 Units by mouth 2 (two) times a week. On Wednesday and Saturday     warfarin 5 MG tablet  Commonly known as:  COUMADIN  Take 1 tablet (5 mg total) by mouth daily.  Start taking on:  08/16/2014       No Known Allergies Follow-up Information    Follow up with [name], MD In 1 week.   Specialty:  Internal Medicine   Why:  hosptial discharge follow up, pmd to monitor inr and repeat bmp   Contact information:   [address] [place] [address] [phone]        The results of significant diagnostics from this hospitalization (including imaging, microbiology, ancillary and laboratory) are listed below for reference.    Significant Diagnostic Studies: Dg Chest 2 View  08/12/2014   CLINICAL DATA:  Cough and pleural effusion  EXAM: CHEST - 2 VIEW  COMPARISON:  08/10/2014  FINDINGS: Cardiac shadow remains enlarged. A small left-sided pleural effusion is noted. No definitive right effusion is seen. Left basilar atelectasis is noted.  IMPRESSION: Persistent left basilar changes. Improved aeration in the right base is noted.   Electronically Signed   By: Inez Catalina M.D.   On: 08/12/2014 12:23   X-ray Chest Pa And Lateral  08/10/2014   CLINICAL DATA:  Pneumonia.  Weakness, shortness of breath.  EXAM: CHEST  2 VIEW  COMPARISON:  08/09/2014  FINDINGS:  Cardiomegaly with vascular congestion. Increasing interstitial prominence throughout the lungs likely reflects interstitial edema. Bibasilar opacities, left greater than right could reflect atelectasis or infiltrates. Small bilateral effusions, left greater than right. No acute bony abnormality.  IMPRESSION: Increasing interstitial prominence, likely interstitial edema.  Bibasilar atelectasis or infiltrates, left greater than right.  Small bilateral effusions.   Electronically Signed   By: Rolm Baptise M.D.   On: 08/10/2014 10:13   Ct Lumbar Spine Wo Contrast  07/16/2014   CLINICAL DATA:  Low back pain.  Steel beam fell on patient 37.  EXAM: CT LUMBAR SPINE WITHOUT CONTRAST  TECHNIQUE: Multidetector CT imaging of the lumbar spine was performed without intravenous contrast administration. Multiplanar CT image reconstructions were also generated.  COMPARISON:  None.  FINDINGS: There is grade 1 anterolisthesis of L4 on L5 secondary to facet disease. The vertebral body heights are maintained. There is no acute fracture or static listhesis. The paravertebral soft tissues are normal. The intraspinal soft tissues are not fully imaged on this examination due to poor soft tissue contrast, but there is no gross soft tissue abnormality. The disc spaces are maintained.  There are mild degenerative changes of bilateral SI joints.  There is a small left pleural effusion. There is abdominal aortic atherosclerosis partially visualized.  There is severe degenerative disc disease with disc space narrowing at L3-4, L4-5 and L5-S1.  T11-12: No significant disc bulge or foraminal stenosis. Mild bilateral facet arthropathy.  T12-L1: Mild broad-based disc osteophyte complex. No foraminal or central canal stenosis.  L1-2: Mild broad-based disc osteophyte complex. Mild bilateral facet arthropathy. No foraminal or central canal stenosis.  L2-3: Mild broad-based disc bulge. Mild bilateral facet arthropathy. No foraminal or central canal  stenosis.  L3-4: Mild broad-based disc osteophyte complex. Severe bilateral facet arthropathy. Mild left foraminal narrowing.  L4-5: A mild broad-based disc bulge. Severe bilateral facet arthropathy. Mild left foraminal stenosis. Moderate right  foraminal stenosis. Mild spinal stenosis.  L5-S1: Mild broad-based disc osteophyte complex. Moderate bilateral facet arthropathy scratch S severe bilateral facet arthropathy. Moderate bilateral foraminal stenosis.  IMPRESSION: 1. Diffuse lumbar spine spondylosis most severe at L4-5 and L5-S1.   Electronically Signed   By: Kathreen Devoid   On: 07/16/2014 14:58   Dg Chest Portable 1 View  08/09/2014   CLINICAL DATA:  Altered mental status.  Prior stroke.  EXAM: PORTABLE CHEST - 1 VIEW  COMPARISON:  02/02/2014  FINDINGS: Normal mediastinum with enlarged cardiac silhouette. There is left pleural effusion. The left base is poorly evaluated. Central venous pulmonary congestion noted.  IMPRESSION: Left pleural effusion and atelectasis. Cannot exclude left lower lobe pneumonia.   Electronically Signed   By: Suzy Bouchard M.D.   On: 08/09/2014 12:04    Microbiology: Recent Results (from the past 240 hour(s))  Culture, blood (routine x 2)     Status: None   Collection Time: 08/09/14 11:15 AM  Result Value Ref Range Status   Specimen Description BLOOD RIGHT ARM  Final   Special Requests BOTTLES DRAWN AEROBIC AND ANAEROBIC 5ML  Final   Culture   Final    DIPHTHEROIDS(CORYNEBACTERIUM SPECIES) Note: Standardized susceptibility testing for this organism is not available. Note: Gram Stain Report Called to,Read Back By and Verified With: [place] A 6/7 11AM Performed at [facility]    Report Status 08/12/2014 FINAL  Final  Urine culture     Status: None   Collection Time: 08/09/14 11:15 AM  Result Value Ref Range Status   Specimen Description URINE, CATHETERIZED  Final   Special Requests NONE  Final   Colony Count NO GROWTH Performed at [facility]   Final   Culture NO GROWTH Performed at [facility]   Final   Report Status 08/10/2014 FINAL  Final  Culture, blood (routine x 2)     Status: None (Preliminary result)   Collection Time: 08/09/14 11:25 AM  Result Value Ref Range Status   Specimen Description BLOOD RIGHT HAND  Final   Special Requests BOTTLES DRAWN AEROBIC AND ANAEROBIC 5ML  Final   Culture   Final           BLOOD CULTURE RECEIVED NO GROWTH TO DATE CULTURE WILL BE HELD FOR 5 DAYS BEFORE ISSUING A FINAL NEGATIVE REPORT Performed at [facility]    Report Status PENDING  Incomplete  MRSA PCR Screening     Status: None   Collection Time: 08/09/14  7:01 PM  Result Value Ref Range Status   MRSA by PCR NEGATIVE NEGATIVE Final    Comment:        The GeneXpert MRSA Assay (FDA approved for NASAL specimens only), is one component of a comprehensive MRSA colonization surveillance program. It is not intended to diagnose MRSA infection nor to guide or monitor treatment for MRSA infections.      Labs: Basic Metabolic Panel:  Recent Labs Lab 08/10/14 0326 08/11/14 0356 08/12/14 0505 08/13/14 0602 08/13/14 0612 08/14/14 0735  NA 136 137 135  --  137 138  K 3.8 3.9 3.2*  --  2.9* 3.3*  CL 108 109 105  --  106 109  CO2 21* 20* 21*  --  24 21*  GLUCOSE 97 103* 78  --  85 85  BUN 6 14 11   --  7 7  CREATININE 1.02 1.19 1.01  --  0.95 0.98  CALCIUM 7.4* 7.8* 7.8*  --  8.1* 8.3*  MG  --   --   --  1.8  --  1.9   Liver Function Tests:  Recent Labs Lab 08/09/14 1118 08/13/14 0612 08/14/14 0735  AST 19 29 29   ALT 12* 17 18  ALKPHOS 56 44 47  BILITOT 0.4 0.3 0.5  PROT 10.7* 9.5* 9.7*  ALBUMIN 2.4* 1.9* 2.2*   No results for input(s): LIPASE, AMYLASE in the last 168 hours. No results for input(s): AMMONIA in the last 168 hours. CBC:  Recent Labs Lab 08/09/14 1118 08/10/14 0326 08/11/14 0356 08/12/14 0505 08/13/14 0612 08/14/14 0735  WBC 7.7 12.6* 9.1 5.0 3.7* 3.8*    NEUTROABS 4.9  --   --   --   --   --   HGB 9.0* 8.8* 8.8* 8.5* 8.6* 8.7*  HCT 30.6* 29.5* 30.2* 29.0* 29.0* 30.4*  MCV 73.7* 73.0* 73.5* 73.4* 72.5* 73.1*  PLT 111* 103* 98* 101* 116* 122*   Cardiac Enzymes: No results for input(s): CKTOTAL, CKMB, CKMBINDEX, TROPONINI in the last 168 hours. BNP: BNP (last 3 results)  Recent Labs  08/09/14 1118 08/10/14 1410 08/14/14 0735  BNP 154.8* 1325.5* 263.4*    ProBNP (last 3 results) No results for input(s): PROBNP in the last 8760 hours.  CBG:  Recent Labs Lab 08/09/14 1111  GLUCAP 85       Signed:  Michaelene Dutan MD, PhD  [facility] 08/14/2014, 12:25 PM

## 2014-08-14 NOTE — Discharge Instructions (Addendum)

## 2014-08-15 LAB — CULTURE, BLOOD (ROUTINE X 2): Culture: NO GROWTH

## 2014-08-17 ENCOUNTER — Ambulatory Visit (INDEPENDENT_AMBULATORY_CARE_PROVIDER_SITE_OTHER): Payer: Commercial Managed Care - HMO | Admitting: Pharmacist Clinician (PhC)/ Clinical Pharmacy Specialist

## 2014-08-17 ENCOUNTER — Ambulatory Visit: Payer: Commercial Managed Care - HMO | Admitting: Pharmacist Clinician (PhC)/ Clinical Pharmacy Specialist

## 2014-08-17 DIAGNOSIS — I639 Cerebral infarction, unspecified: Secondary | ICD-10-CM

## 2014-08-17 LAB — POCT INR: INR: 1.6

## 2014-08-18 ENCOUNTER — Telehealth: Payer: Self-pay | Admitting: Cardiology

## 2014-08-18 NOTE — Telephone Encounter (Signed)
Palis is calling to find out if Terry Robertson should stay on the 5mg  Warfrain for his coumadin , or go back to what he was taking . Please call  [phone] . Palis states that she will be at this number until 12:45pm before the therapist come to see Terry Robertson .Marland Kitchen    Thanks

## 2014-08-18 NOTE — Telephone Encounter (Signed)
Spoke with Ms. Terry Robertson, will need to call her with dosing information from [facility]

## 2014-08-21 ENCOUNTER — Ambulatory Visit (INDEPENDENT_AMBULATORY_CARE_PROVIDER_SITE_OTHER): Payer: Commercial Managed Care - HMO | Admitting: Cardiology

## 2014-08-21 DIAGNOSIS — I639 Cerebral infarction, unspecified: Secondary | ICD-10-CM

## 2014-08-21 LAB — PROTIME-INR: INR: 1.4 — AB (ref <=1.1)

## 2014-08-27 ENCOUNTER — Ambulatory Visit (INDEPENDENT_AMBULATORY_CARE_PROVIDER_SITE_OTHER): Payer: Commercial Managed Care - HMO | Admitting: Internal Medicine

## 2014-08-27 DIAGNOSIS — I639 Cerebral infarction, unspecified: Secondary | ICD-10-CM

## 2014-08-27 LAB — POCT INR: INR: 2.7

## 2014-08-31 ENCOUNTER — Ambulatory Visit: Payer: Commercial Managed Care - HMO | Admitting: Podiatry

## 2014-09-01 ENCOUNTER — Encounter: Payer: Self-pay | Admitting: Cardiology

## 2014-09-01 ENCOUNTER — Ambulatory Visit (INDEPENDENT_AMBULATORY_CARE_PROVIDER_SITE_OTHER): Payer: Commercial Managed Care - HMO | Admitting: Cardiology

## 2014-09-01 VITALS — BP 142/74 | HR 72 | Ht 65.0 in | Wt 230.2 lb

## 2014-09-01 DIAGNOSIS — E785 Hyperlipidemia, unspecified: Secondary | ICD-10-CM

## 2014-09-01 DIAGNOSIS — I639 Cerebral infarction, unspecified: Secondary | ICD-10-CM

## 2014-09-01 DIAGNOSIS — Z9861 Coronary angioplasty status: Secondary | ICD-10-CM

## 2014-09-01 DIAGNOSIS — I2510 Atherosclerotic heart disease of native coronary artery without angina pectoris: Secondary | ICD-10-CM | POA: Diagnosis not present

## 2014-09-01 DIAGNOSIS — I480 Paroxysmal atrial fibrillation: Secondary | ICD-10-CM | POA: Diagnosis not present

## 2014-09-01 DIAGNOSIS — Z79899 Other long term (current) drug therapy: Secondary | ICD-10-CM

## 2014-09-01 DIAGNOSIS — I10 Essential (primary) hypertension: Secondary | ICD-10-CM | POA: Diagnosis not present

## 2014-09-01 DIAGNOSIS — I519 Heart disease, unspecified: Secondary | ICD-10-CM

## 2014-09-01 DIAGNOSIS — I5189 Other ill-defined heart diseases: Secondary | ICD-10-CM

## 2014-09-01 NOTE — Progress Notes (Signed)
PCP: [name], MD  Clinic Note: Chief Complaint  Patient presents with  . Hospitalization Follow-up    about 3 weeks for fluid on lungs and had pna  . Follow-up    6 month followup; no chest pain, shortness of breath-has little, no edema, no pain in legs, no cramping in legs, no lightheadedness, no dizziness  . Coronary Artery Disease  . Atrial Fibrillation    HPI: Terry Robertson. is a 81 y.o. male with a PMH below who presents today for ~1 yr f/u of PAF (h/o CVA), CAD-PCI (prior MI).     He has a distant history of CAD with MI in 1997 status post PCI to circumflex and LAD.  Last cath in 2008 with mid RCA occlusion but no other stenosis. Stable since.  Diagnosed with PAF during admission for weakness and fatigue in the setting of UTI. Despite being placed on heparin immediately upon evaluation, he had a stroke current hospitalization with expressive aphasia.   He has had at least one subsequent stroke  Last visit in July 2015 -- for follow-up of post hospital visit from parapneumonic pneumothorax with chest tube placement. He was admitted earlier this month to [facility] June 5-10, 2016  . Dx on admission was Sepsis/bacteremia (? PNA with GNR)- Rx with Abx. Complicated by mild Diastolic HF - given IV Lasix.  Also dx with microcytic anemia.   Echo 08/10/14: EF 60-65%, mod Conc LVH, cannot assess Diastolic Fxn.  Mild AS. Mild MVP without MR., Mod LA/RA dilation.  Slow to answer ?s.  Stutters @ baseline.  Past Medical History  Diagnosis Date  . Diabetes mellitus   . Essential hypertension   . Stroke 08/2011, 03/2012    L MCA (Afib RVR while admitted for hypotension) - expressive aphasia (almost totally recovered)  . Seizures     Post CVA; on Keppra; stable  . OSA on CPAP   . Paroxysmal atrial fibrillation     on coumadin; Diagnosed at the time of his 1st CVA  . Obesity, morbid     BMI  . ST-segment elevation myocardial infarction (STEMI) of inferior wall 1997    inferior  MI with left circumflex stent  . CAD S/P percutaneous coronary angioplasty 1997    status post remote PCI to the LAD and circumflex in 1997, heart cath in 2008 showed widely patent stents in the circumflex and LAD.  RCA had a mid lesion and then was totally occluded distally.  . Myelodysplastic syndrome     w/mild anemia an neutropenia  . BPH (benign prostatic hyperplasia)     TURP in 12/2010  . CHF (congestive heart failure)   . Asbestosis     family unclear where he was evaluated in the past  . Aortic stenosis, mild 08/10/2014    : EF 60-65%, mod Conc LVH, cannot assess Diastolic Fxn.  Mild AS. Mild MVP without MR., Mod LA/RA dilation.    Prior Cardiac Evaluation and Past Surgical History: Past Surgical History  Procedure Laterality Date  . Transthoracic echocardiogram  08/06/11; 08/2014    a) EF 55% to 65% with grade2 DD/pseudonormal w/ mildly dilated LA with high LAP/LVEDP; b) EF 60-65%, mod Conc LVH, cannot assess Diastolic Fxn.  Mild AS. Mild MVP without MR., Mod LA/RA dilation.  . Cardiac catheterization  02/08/07    widely patent stent in the circumflex and LAD.  RCA had a mid lesion and then was totally occluded distally. Cook bare-metal 3.5x20 mm stent  . Transurethral  resection of prostate  12/2010  . Nm myoview ltd  01/09/1999    Inferior wall thinning with possible mild ischemia versus infarct. This would correlate well with his known RCA occlusion   Interval History: He presents today really without any major cardiac complaints. He is not a great historian secondary to his mild residual aphasia and now what seems to be probably memory loss. He is recovering from his cough and still is a bit fatigued from his recent hospital stay. He is lost quite a bit of weight since January. This is not necessarily related to intentional weight loss, but mostly that she just does not feel like eating that much anymore. Thankfully, from a cardiac standpoint he's remained relatively stable. No  resting or exertional anginal type symptoms. No heart failure symptoms of PND, orthopnea or edema. Not requiring any additional Lasix. He got some Lasix in the hospital but otherwise has not had any additional doses. Cardiac ROS:  No palpitations, lightheadedness, dizziness, weakness or syncope/near syncope. No TIA/amaurosis fugax symptoms. No melena, hematochezia, hematuria, or epstaxis. No claudication.  ROS: A comprehensive was performed. Review of Systems  Constitutional: Positive for weight loss (Not eating as much.  Cut back on sweets). Negative for fever.  Eyes: Positive for blurred vision.  Respiratory: Positive for cough (Improving and less productive) and shortness of breath (Secondary to pneumonia/bronchitis).   Musculoskeletal:       Mild arthritis pains that limits his walking ability. He also has a somewhat unsteady gait  Neurological: Negative for headaches.       No new CVA/TIA or amaurosis fugax symptoms.  Endo/Heme/Allergies: Does not bruise/bleed easily.    Current Outpatient Prescriptions on File Prior to Visit  Medication Sig Dispense Refill  . aspirin EC 81 MG tablet Take 81 mg by mouth daily.    Marland Kitchen atenolol (TENORMIN) 25 MG tablet Take 1 tablet (25 mg total) by mouth daily. 30 tablet 3  . atorvastatin (LIPITOR) 20 MG tablet Take 20 mg by mouth daily.    Marland Kitchen diltiazem (DILACOR XR) 180 MG 24 hr capsule Take 180 mg by mouth daily.    . feeding supplement, ENSURE ENLIVE, (ENSURE ENLIVE) LIQD Take 237 mLs by mouth 2 (two) times daily between meals. 237 mL 12  . furosemide (LASIX) 20 MG tablet Take 20 mg by mouth daily.    Marland Kitchen gabapentin (NEURONTIN) 100 MG capsule Take 100 mg by mouth daily.    Marland Kitchen latanoprost (XALATAN) 0.005 % ophthalmic solution Place 1 drop into both eyes at bedtime.   4  . levETIRAcetam (KEPPRA) 500 MG tablet Take 1 tablet (500 mg total) by mouth every 12 (twelve) hours. 180 tablet 2  . MYRBETRIQ 50 MG TB24 tablet Take 50 mg by mouth daily.  3  . VASCEPA  1 G CAPS Take 1 g by mouth.  2  . warfarin (COUMADIN) 5 MG tablet Take 1 tablet (5 mg total) by mouth daily. 135 tablet 1   No current facility-administered medications on file prior to visit.   No Known Allergies   History  Substance Use Topics  . Smoking status: Former Smoker    Types: Cigarettes    Quit date: 03/08/1988  . Smokeless tobacco: Never Used  . Alcohol Use: No     Family History  Problem Relation Age of Onset  . Heart disease Mother   . Heart attack Father 30     Wt Readings from Last 3 Encounters:  09/01/14 104.418 kg (230 lb 3.2 oz)  08/12/14 104.8 kg (231 lb 0.7 oz)  03/19/14 113.263 kg (249 lb 11.2 oz)    PHYSICAL EXAM BP 142/74 mmHg  Pulse 72  Ht 5\' 5"  (1.651 m)  Wt 104.418 kg (230 lb 3.2 oz)  BMI 38.31 kg/m2 General: Pleasant gentleman. Appears younger than his stated age. He is in no acute distress. He is alert and oriented x3. He answers questions appropriately but is a bit slow with answering some questions.Marland Kitchen He is well groomed and well nourished. H  HEENT: NCAT. EOMI. MMM. Speech is not so slurred, but remains a bit slow. Anicteric sclerae.  Neck: Supple with no LAN, JVD, or carotid bruit.  Lungs: CTAB. Nonlabored. Normal effort. Good air movement.  Heart: RRR, Distant S1, S2. Occasional ectopy; soft, early peaking C-D SEM heard at the right upper sternal border.  Abdomen: Soft/NT/ND/NABS. No HSM. Difficult to palpate due to obesity.  Extremities: No C/C/E. Pedal pulses are 1+ bilaterally and upper extremities are 2+ bilaterally.    Adult ECG Report  Rate: 72 ;  Rhythm: normal sinus rhythm and Nonspecific ST and T-wave abdomen modalities. Normal axis, intervals and durations. Borderline first-degree AV block.  Narrative Interpretation: No notable change  Other studies Reviewed: Additional studies/ records that were reviewed today include: Echocardiogram from 08/10/2014 (see above) Review of the above records demonstrates: RelativeLY  normal EF with mild AST Recent Labs:  From hospital evaluation   Creatinine 0.98. Potassium was as low as 2.9 up to 3.3 on discharge.  Hemoglobin 8.7. Initial INR was 4.2 reduced to 1.4 discharge.   ASSESSMENT / PLAN: Problem List Items Addressed This Visit    CAD PCI to LAD/CFX in 1997. Cath '08- medical Rx - Primary (Chronic)    No angina or heart failure symptoms. Continue aspirin, statin, calcium channel blocker plus beta blocker and statin/omega-3 fatty acids.      Relevant Orders   EKG 12-Lead (Completed)   Lipid panel   Comprehensive metabolic panel   Dyslipidemia, goal LDL below 70 (Chronic)    Labs are monitored by PCP. He is on atorvastatin and omega-3 fatty acids. I have not seen recent labs.      Relevant Orders   EKG 12-Lead (Completed)   Lipid panel   Comprehensive metabolic panel   Essential hypertension (Chronic)    Borderline pressures today. I'm inclined to leave his medications alone. With a relatively normal rate control, I am reluctant to increase his calcium channel blocker or beta blocker.       Relevant Orders   EKG 12-Lead (Completed)   Lipid panel   Comprehensive metabolic panel   History of likely cardioembolic stroke (07/12/8323) with onset of new diagnosis A. fib; expressive aphasia (Chronic)    Overall the residual effect seems to be relatively small, but he had some signs of memory loss and cognitive decline. He also is less steady gait. This would suggest he probably had small microinfarcts. Remains on aspirin plus warfarin. Also aggressive cardiovascular risk management as noted above.      Relevant Orders   EKG 12-Lead (Completed)   Lipid panel   Comprehensive metabolic panel   Left ventricular diastolic dysfunction, NYHA class 2 (grade 2 dysfunction with elevated filling pressures) (Chronic)    Really only minimal lower TIMI edema on low-dose of Lasix. Will also use for when necessary. We discussed sliding scale. No heart failure symptoms  of PND, orthopnea. If additional blood pressure control is needed, I would consider adding an ACE inhibitor or ARB for  better afterload reduction.      Relevant Orders   EKG 12-Lead (Completed)   Lipid panel   Comprehensive metabolic panel   PAF (paroxysmal atrial fibrillation) (Chronic)    He  he remains in normal sinus rhythm with beta blocker and calcium channel blocker for rate control. Anticoagulated with warfarin. CHADS2VASC2 = 7. No recurrent bleeding combination       Relevant Orders   EKG 12-Lead (Completed)   Lipid panel   Comprehensive metabolic panel   Severe obesity (BMI >= 40) (Chronic)    He had a significant drop in weight over the last few months. I'm not sure how much of that was intentional. I did recommend however that he takes advantage of the loss of weight and not gain it back.      Relevant Orders   EKG 12-Lead (Completed)   Lipid panel   Comprehensive metabolic panel    Other Visit Diagnoses    Polypharmacy        Relevant Orders    Lipid panel    Comprehensive metabolic panel      Current medicines are reviewed at length with the patient today. (+/- concerns) N/A The following changes have been made: N/A labs/ tests ordered today include:   Orders Placed This Encounter  Procedures  . Lipid panel    Order Specific Question:  Has the patient fasted?    Answer:  Yes  . Comprehensive metabolic panel    Order Specific Question:  Has the patient fasted?    Answer:  Yes  . EKG 12-Lead   Meds ordered this encounter  Medications  . Ascorbic Acid (VITAMIN C) 1000 MG tablet    Sig: Take 1,000 mg by mouth daily.  . Cholecalciferol (VITAMIN D) 2000 UNITS CAPS    Sig: Take 1 capsule by mouth.  . Multiple Vitamins-Minerals (MENS MULTIVITAMIN PLUS PO)    Sig: Take 1 tablet by mouth.  Marland Kitchen HYDROcodone-acetaminophen (NORCO/VICODIN) 5-325 MG per tablet    Sig: Take 1 tablet by mouth 2 (two) times daily as needed. Take 1 tab bid or tid prn    Refill:  0     Have routine labs checked: PLEASE DO LABS -DO NOT EAT OR DRINK THE MORNING OF THE TEST- ( CMP LIPIDS)  CONTINUE WITH CURRENT MEDICATIONS.   Your physician recommends that you schedule a follow-up appointment in [place] [facility] - ([place])  Your physician wants you to follow-up in [place].  Leonie Man, M.D., M.S. Interventional Cardiologist   Pager # [phone]   CAD PCI to LAD/CFX in 1997. Cath '08- medical Rx No angina. Continue aspirin, beta blocker and statin. Also on calcium channel blocker.  PAF (paroxysmal atrial fibrillation) Normal sinus rhythm on exam. On the atenolol and diltiazem for rate control. At this time no indication for rhythm control. CHA2DS2VSc - 7, back on warfarin for anticoagulation without bleeding complication  History of likely cardioembolic stroke (07/09/2128) with onset of new diagnosis A. fib; expressive aphasia Minimal residual effect. Remains on aspirin plus warfarin  Left ventricular diastolic dysfunction, NYHA class 2 (grade 2 dysfunction with elevated filling pressures) Mild lower extremity edema. He is on low dose of Lasix which I instructed him to use a sliding scale basis. He is currently up a few pounds also recommended that he maybe take a couple extra doses the next few days to reduce his weight. He could just have gained the weight back that he lost  while in the hospital, so would not be overly aggressive. He's not overly active, therefore be difficult to see how symptomatic he truly is. No PND or orthopnea however.  Essential hypertension He is a bit hypertensive today. Will increase the atenolol dose to a full 25 mg and switch to the evening. Take diltiazem in the morning. If his pressure continues to be elevated, would not have additional room to increase rate control agents, would probably add ARB or ACE inhibitor.  Dyslipidemia, goal LDL below 70 Has been stable on home medications. Labs followed  by PCP. Continues taking atorvastatin with omega-3 fish oil & Vascepa caps.  Severe obesity (BMI >= 40) Continue to stress the importance of dietary medication and increasing exercise.

## 2014-09-01 NOTE — Patient Instructions (Addendum)
Have routine labs checked: PLEASE DO LABS -DO NOT EAT OR DRINK THE MORNING OF THE TEST- ( CMP LIPIDS)  CONTINUE WITH CURRENT MEDICATIONS.   Your physician recommends that you schedule a follow-up appointment in [place] [facility] - ([place])  Your physician wants you to follow-up in [place].  You will receive a reminder letter in the mail two months in advance. If you don't receive a letter, please call our office to schedule the follow-up appointment.

## 2014-09-02 ENCOUNTER — Encounter: Payer: Self-pay | Admitting: Cardiology

## 2014-09-02 NOTE — Assessment & Plan Note (Signed)
Borderline pressures today. I'm inclined to leave his medications alone. With a relatively normal rate control, I am reluctant to increase his calcium channel blocker or beta blocker.

## 2014-09-02 NOTE — Assessment & Plan Note (Signed)
Overall the residual effect seems to be relatively small, but he had some signs of memory loss and cognitive decline. He also is less steady gait. This would suggest he probably had small microinfarcts. Remains on aspirin plus warfarin. Also aggressive cardiovascular risk management as noted above.

## 2014-09-02 NOTE — Assessment & Plan Note (Signed)
He  he remains in normal sinus rhythm with beta blocker and calcium channel blocker for rate control. Anticoagulated with warfarin. CHADS2VASC2 = 7. No recurrent bleeding combination

## 2014-09-02 NOTE — Assessment & Plan Note (Signed)
He had a significant drop in weight over the last few months. I'm not sure how much of that was intentional. I did recommend however that he takes advantage of the loss of weight and not gain it back.

## 2014-09-02 NOTE — Assessment & Plan Note (Signed)
No angina or heart failure symptoms. Continue aspirin, statin, calcium channel blocker plus beta blocker and statin/omega-3 fatty acids.

## 2014-09-02 NOTE — Assessment & Plan Note (Addendum)
Really only minimal lower TIMI edema on low-dose of Lasix. Will also use for when necessary. We discussed sliding scale. No heart failure symptoms of PND, orthopnea. If additional blood pressure control is needed, I would consider adding an ACE inhibitor or ARB for better afterload reduction.

## 2014-09-02 NOTE — Assessment & Plan Note (Signed)
Labs are monitored by PCP. He is on atorvastatin and omega-3 fatty acids. I have not seen recent labs.

## 2014-09-08 ENCOUNTER — Encounter: Payer: Self-pay | Admitting: Podiatry

## 2014-09-08 ENCOUNTER — Ambulatory Visit (INDEPENDENT_AMBULATORY_CARE_PROVIDER_SITE_OTHER): Payer: Commercial Managed Care - HMO | Admitting: Podiatry

## 2014-09-08 VITALS — Resp 15

## 2014-09-08 DIAGNOSIS — M79676 Pain in unspecified toe(s): Secondary | ICD-10-CM

## 2014-09-08 DIAGNOSIS — B351 Tinea unguium: Secondary | ICD-10-CM

## 2014-09-08 NOTE — Patient Instructions (Signed)
Apply in a biotic ointment such as Neosporin, Polysporin, Triple Antibiotic ointment to the end of the third left toe daily until a scab forms Removed bandage on third left toe in 24 hours     Diabetes and Foot Care Diabetes may cause you to have problems because of poor blood supply (circulation) to your feet and legs. This may cause the skin on your feet to become thinner, break easier, and heal more slowly. Your skin may become dry, and the skin may peel and crack. You may also have nerve damage in your legs and feet causing decreased feeling in them. You may not notice minor injuries to your feet that could lead to infections or more serious problems. Taking care of your feet is one of the most important things you can do for yourself.  HOME CARE INSTRUCTIONS  Wear shoes at all times, even in the house. Do not go barefoot. Bare feet are easily injured.  Check your feet daily for blisters, cuts, and redness. If you cannot see the bottom of your feet, use a mirror or ask someone for help.  Wash your feet with warm water (do not use hot water) and mild soap. Then pat your feet and the areas between your toes until they are completely dry. Do not soak your feet as this can dry your skin.  Apply a moisturizing lotion or petroleum jelly (that does not contain alcohol and is unscented) to the skin on your feet and to dry, brittle toenails. Do not apply lotion between your toes.  Trim your toenails straight across. Do not dig under them or around the cuticle. File the edges of your nails with an emery board or nail file.  Do not cut corns or calluses or try to remove them with medicine.  Wear clean socks or stockings every day. Make sure they are not too tight. Do not wear knee-high stockings since they may decrease blood flow to your legs.  Wear shoes that fit properly and have enough cushioning. To break in new shoes, wear them for just a few hours a day. This prevents you from injuring your  feet. Always look in your shoes before you put them on to be sure there are no objects inside.  Do not cross your legs. This may decrease the blood flow to your feet.  If you find a minor scrape, cut, or break in the skin on your feet, keep it and the skin around it clean and dry. These areas may be cleansed with mild soap and water. Do not cleanse the area with peroxide, alcohol, or iodine.  When you remove an adhesive bandage, be sure not to damage the skin around it.  If you have a wound, look at it several times a day to make sure it is healing.  Do not use heating pads or hot water bottles. They may burn your skin. If you have lost feeling in your feet or legs, you may not know it is happening until it is too late.  Make sure your health care provider performs a complete foot exam at least annually or more often if you have foot problems. Report any cuts, sores, or bruises to your health care provider immediately. SEEK MEDICAL CARE IF:   You have an injury that is not healing.  You have cuts or breaks in the skin.  You have an ingrown nail.  You notice redness on your legs or feet.  You feel burning or tingling in your  legs or feet.  You have pain or cramps in your legs and feet.  Your legs or feet are numb.  Your feet always feel cold. SEEK IMMEDIATE MEDICAL CARE IF:   There is increasing redness, swelling, or pain in or around a wound.  There is a red line that goes up your leg.  Pus is coming from a wound.  You develop a fever or as directed by your health care provider.  You notice a bad smell coming from an ulcer or wound. Document Released: 02/18/2000 Document Revised: 10/23/2012 Document Reviewed: 07/30/2012 [facility] Patient Information 2015 [place], [place]. This information is not intended to replace advice given to you by your health care provider. Make sure you discuss any questions you have with your health care provider. Diabetes and Foot Care Diabetes and  Foot Care Diabetes may cause you to have problems because of poor blood supply (circulation) to your feet and legs. This may cause the skin on your feet to become thinner, break easier, and heal more slowly. Your skin may become dry, and the skin may peel and crack. You may also have nerve damage in your legs and feet causing decreased feeling in them. You may not notice minor injuries to your feet that could lead to infections or more serious problems. Taking care of your feet is one of the most important things you can do for yourself.  HOME CARE INSTRUCTIONS  Wear shoes at all times, even in the house. Do not go barefoot. Bare feet are easily injured.  Check your feet daily for blisters, cuts, and redness. If you cannot see the bottom of your feet, use a mirror or ask someone for help.  Wash your feet with warm water (do not use hot water) and mild soap. Then pat your feet and the areas between your toes until they are completely dry. Do not soak your feet as this can dry your skin.  Apply a moisturizing lotion or petroleum jelly (that does not contain alcohol and is unscented) to the skin on your feet and to dry, brittle toenails. Do not apply lotion between your toes.  Trim your toenails straight across. Do not dig under them or around the cuticle. File the edges of your nails with an emery board or nail file.  Do not cut corns or calluses or try to remove them with medicine.  Wear clean socks or stockings every day. Make sure they are not too tight. Do not wear knee-high stockings since they may decrease blood flow to your legs.  Wear shoes that fit properly and have enough cushioning. To break in new shoes, wear them for just a few hours a day. This prevents you from injuring your feet. Always look in your shoes before you put them on to be sure there are no objects inside.  Do not cross your legs. This may decrease the blood flow to your feet.  If you find a minor scrape, cut, or break  in the skin on your feet, keep it and the skin around it clean and dry. These areas may be cleansed with mild soap and water. Do not cleanse the area with peroxide, alcohol, or iodine.  When you remove an adhesive bandage, be sure not to damage the skin around it.  If you have a wound, look at it several times a day to make sure it is healing.  Do not use heating pads or hot water bottles. They may burn your skin. If you have  lost feeling in your feet or legs, you may not know it is happening until it is too late.  Make sure your health care provider performs a complete foot exam at least annually or more often if you have foot problems. Report any cuts, sores, or bruises to your health care provider immediately. SEEK MEDICAL CARE IF:   You have an injury that is not healing.  You have cuts or breaks in the skin.  You have an ingrown nail.  You notice redness on your legs or feet.  You feel burning or tingling in your legs or feet.  You have pain or cramps in your legs and feet.  Your legs or feet are numb.  Your feet always feel cold. SEEK IMMEDIATE MEDICAL CARE IF:   There is increasing redness, swelling, or pain in or around a wound.  There is a red line that goes up your leg.  Pus is coming from a wound.  You develop a fever or as directed by your health care provider.  You notice a bad smell coming from an ulcer or wound. Document Released: 02/18/2000 Document Revised: 10/23/2012 Document Reviewed: 07/30/2012 [facility] Patient Information 2015 [place], [place]. This information is not intended to replace advice given to you by your health care provider. Make sure you discuss any questions you have with your health care provider.  Diabetes may cause you to have problems because of poor blood supply (circulation) to your feet and legs. This may cause the skin on your feet to become thinner, break easier, and heal more slowly. Your skin may become dry, and the skin may peel  and crack. You may also have nerve damage in your legs and feet causing decreased feeling in them. You may not notice minor injuries to your feet that could lead to infections or more serious problems. Taking care of your feet is one of the most important things you can do for yourself.  HOME CARE INSTRUCTIONS  Wear shoes at all times, even in the house. Do not go barefoot. Bare feet are easily injured.  Check your feet daily for blisters, cuts, and redness. If you cannot see the bottom of your feet, use a mirror or ask someone for help.  Wash your feet with warm water (do not use hot water) and mild soap. Then pat your feet and the areas between your toes until they are completely dry. Do not soak your feet as this can dry your skin.  Apply a moisturizing lotion or petroleum jelly (that does not contain alcohol and is unscented) to the skin on your feet and to dry, brittle toenails. Do not apply lotion between your toes.  Trim your toenails straight across. Do not dig under them or around the cuticle. File the edges of your nails with an emery board or nail file.  Do not cut corns or calluses or try to remove them with medicine.  Wear clean socks or stockings every day. Make sure they are not too tight. Do not wear knee-high stockings since they may decrease blood flow to your legs.  Wear shoes that fit properly and have enough cushioning. To break in new shoes, wear them for just a few hours a day. This prevents you from injuring your feet. Always look in your shoes before you put them on to be sure there are no objects inside.  Do not cross your legs. This may decrease the blood flow to your feet.  If you find a minor scrape,  cut, or break in the skin on your feet, keep it and the skin around it clean and dry. These areas may be cleansed with mild soap and water. Do not cleanse the area with peroxide, alcohol, or iodine.  When you remove an adhesive bandage, be sure not to damage the skin  around it.  If you have a wound, look at it several times a day to make sure it is healing.  Do not use heating pads or hot water bottles. They may burn your skin. If you have lost feeling in your feet or legs, you may not know it is happening until it is too late.  Make sure your health care provider performs a complete foot exam at least annually or more often if you have foot problems. Report any cuts, sores, or bruises to your health care provider immediately. SEEK MEDICAL CARE IF:   You have an injury that is not healing.  You have cuts or breaks in the skin.  You have an ingrown nail.  You notice redness on your legs or feet.  You feel burning or tingling in your legs or feet.  You have pain or cramps in your legs and feet.  Your legs or feet are numb.  Your feet always feel cold. SEEK IMMEDIATE MEDICAL CARE IF:   There is increasing redness, swelling, or pain in or around a wound.  There is a red line that goes up your leg.  Pus is coming from a wound.  You develop a fever or as directed by your health care provider.  You notice a bad smell coming from an ulcer or wound. Document Released: 02/18/2000 Document Revised: 10/23/2012 Document Reviewed: 07/30/2012 [facility] Patient Information 2015 [place], [place]. This information is not intended to replace advice given to you by your health care provider. Make sure you discuss any questions you have with your health care provider.

## 2014-09-08 NOTE — Progress Notes (Signed)
Patient ID: Terry Robertson., male   DOB: 1928/04/07, 81 y.o.   MRN: [id number]  Subjective: This patient presents complaining of painful toenails and requests toenail debridement. The patient's wife and son are present in the treatment room today  Objective: The toenails are elongated, discolored, incurvated, brittle, discolored and tender to direct palpation 6-10  Assessment: Symptomatic onychomycoses 6-10  Plan: Debridement of toenails mechanically and electrically with slight pinpoint bleeding distal third left toe noted. An anabolic dressing applied to the area. Patient was advised to continue applying topical anabiotic ointment to this area daily until a scab forms  Reappoint 3 months

## 2014-09-14 ENCOUNTER — Telehealth: Payer: Self-pay | Admitting: Hematology and Oncology

## 2014-09-14 ENCOUNTER — Telehealth: Payer: Self-pay | Admitting: *Deleted

## 2014-09-14 NOTE — Telephone Encounter (Signed)
pt wife called to sched appt due to white blood count low....i transferred to [place] in triage

## 2014-09-14 NOTE — Telephone Encounter (Addendum)
Patient's significant other Terry Robertson called reporting she "just received call from PCP Dr. Baird Cancer for Terry Robertson to avoid crowds and wear a mask because his blood counts were low last week.  We need an appointment because this is what you all see him for."  Denies fever, cough, congestion, vomiting, diarrhea or any signs or symptoms.  Asked Terry Robertson to ask Dr. Baird Cancer to fax lab results to Dr. Alvy Bimler.  We then can determine if we need further tests and appointment need.

## 2014-09-15 ENCOUNTER — Telehealth: Payer: Self-pay | Admitting: Hematology and Oncology

## 2014-09-15 ENCOUNTER — Other Ambulatory Visit: Payer: Self-pay | Admitting: Hematology and Oncology

## 2014-09-15 DIAGNOSIS — D469 Myelodysplastic syndrome, unspecified: Secondary | ICD-10-CM

## 2014-09-15 NOTE — Telephone Encounter (Signed)
No results received.  Called Dr. Baird Cancer office leaving message with H.I.M. And message with Triage Line for Dr. Baird Cancer with [facility] Phone and fax numbers.

## 2014-09-15 NOTE — Telephone Encounter (Signed)
INFORMED PT.'S WIFE THAT THE LAB REPORT FROM DR.SANDERS, (PT.'S PCP), HAS NOT BEEN RECEIVED AT THIS OFFICE. A REQUEST FOR THE LAB REPORT WAS MADE ON DR.SANDERS' OFFICE VOICE MAIL. THIS INFORMATION WAS GIVEN TO PT.'S WIFE. SHE WILL CONTACT DR.SANDERS' OFFICE.

## 2014-09-15 NOTE — Telephone Encounter (Signed)
NO NOTE

## 2014-09-15 NOTE — Telephone Encounter (Signed)
Patient wife called and stated she has not recieved any call back for patient to get appointment due to blood count is low per PCP. I informed patient we have to recieve orders by the MD before we can schedule any appointment. I then trasnfer patient to transferred patient to triage to see if anything results have been sent over by PCP.

## 2014-09-15 NOTE — Telephone Encounter (Signed)
lvm with appts for 7.18 per MD

## 2014-09-15 NOTE — Telephone Encounter (Signed)
returned call adn pt needed sooner appt due to [facility] advised that blood was low...i transferred to triage

## 2014-09-16 ENCOUNTER — Ambulatory Visit (INDEPENDENT_AMBULATORY_CARE_PROVIDER_SITE_OTHER): Payer: Commercial Managed Care - HMO | Admitting: Pharmacist Clinician (PhC)/ Clinical Pharmacy Specialist

## 2014-09-16 ENCOUNTER — Telehealth: Payer: Self-pay | Admitting: Hematology and Oncology

## 2014-09-16 DIAGNOSIS — I4891 Unspecified atrial fibrillation: Secondary | ICD-10-CM | POA: Diagnosis not present

## 2014-09-16 DIAGNOSIS — Z7901 Long term (current) use of anticoagulants: Secondary | ICD-10-CM

## 2014-09-16 DIAGNOSIS — I639 Cerebral infarction, unspecified: Secondary | ICD-10-CM

## 2014-09-16 LAB — POCT INR: INR: 2.9

## 2014-09-16 NOTE — Telephone Encounter (Signed)
lvm for pt again today to confirm 7.18 appt.

## 2014-09-21 ENCOUNTER — Other Ambulatory Visit: Payer: Self-pay | Admitting: *Deleted

## 2014-09-21 ENCOUNTER — Other Ambulatory Visit (HOSPITAL_BASED_OUTPATIENT_CLINIC_OR_DEPARTMENT_OTHER): Payer: Commercial Managed Care - HMO

## 2014-09-21 ENCOUNTER — Telehealth: Payer: Self-pay | Admitting: Hematology and Oncology

## 2014-09-21 ENCOUNTER — Other Ambulatory Visit: Payer: Self-pay | Admitting: Hematology and Oncology

## 2014-09-21 ENCOUNTER — Encounter: Payer: Self-pay | Admitting: Hematology and Oncology

## 2014-09-21 ENCOUNTER — Ambulatory Visit (HOSPITAL_BASED_OUTPATIENT_CLINIC_OR_DEPARTMENT_OTHER): Payer: Commercial Managed Care - HMO | Admitting: Hematology and Oncology

## 2014-09-21 ENCOUNTER — Other Ambulatory Visit (HOSPITAL_COMMUNITY)
Admission: RE | Admit: 2014-09-21 | Discharge: 2014-09-21 | Disposition: A | Discharge status: Home / Self Care | Payer: Commercial Managed Care - HMO | Source: Ambulatory Visit | Attending: Hematology and Oncology | Admitting: Hematology and Oncology

## 2014-09-21 VITALS — BP 153/63 | HR 78 | Temp 98.2°F | Resp 18 | Ht 65.0 in | Wt 227.5 lb

## 2014-09-21 DIAGNOSIS — D472 Monoclonal gammopathy: Secondary | ICD-10-CM

## 2014-09-21 DIAGNOSIS — D469 Myelodysplastic syndrome, unspecified: Secondary | ICD-10-CM

## 2014-09-21 DIAGNOSIS — D61818 Other pancytopenia: Secondary | ICD-10-CM | POA: Insufficient documentation

## 2014-09-21 HISTORY — DX: Monoclonal gammopathy: D47.2

## 2014-09-21 LAB — CBC & DIFF AND RETIC
BASO%: 0.7 % (ref 0.0–2.0)
Basophils Absolute: 0 10e3/uL (ref 0.0–0.1)
EOS%: 0.5 % (ref 0.0–7.0)
Eosinophils Absolute: 0 10e3/uL (ref 0.0–0.5)
HCT: 27.9 % — ABNORMAL LOW (ref 38.4–49.9)
HGB: 8.7 g/dL — ABNORMAL LOW (ref 13.0–17.1)
Immature Retic Fract: 7.3 % (ref 3.00–10.60)
LYMPH%: 46 % (ref 14.0–49.0)
MCH: 21.6 pg — ABNORMAL LOW (ref 27.2–33.4)
MCHC: 31.3 g/dL — ABNORMAL LOW (ref 32.0–36.0)
MCV: 69.1 fL — ABNORMAL LOW (ref 79.3–98.0)
MONO#: 1.2 10e3/uL — ABNORMAL HIGH (ref 0.1–0.9)
MONO%: 34.7 % — ABNORMAL HIGH (ref 0.0–14.0)
NEUT#: 0.6 10e3/uL — ABNORMAL LOW (ref 1.5–6.5)
NEUT%: 18.1 % — ABNORMAL LOW (ref 39.0–75.0)
Platelets: 96 10e3/uL — ABNORMAL LOW (ref 140–400)
RBC: 4.03 10e6/uL — ABNORMAL LOW (ref 4.20–5.82)
RDW: 22.9 % — ABNORMAL HIGH (ref 11.0–14.6)
Retic %: 0.7 % — ABNORMAL LOW (ref 0.80–1.80)
Retic Ct Abs: 28.21 10e3/uL — ABNORMAL LOW (ref 34.80–93.90)
WBC: 3.5 10e3/uL — ABNORMAL LOW (ref 4.0–10.3)
lymph#: 1.6 10e3/uL (ref 0.9–3.3)
nRBC: 0 % (ref 0–0)

## 2014-09-21 LAB — IRON AND TIBC CHCC
%SAT: 11 % — ABNORMAL LOW (ref 20–55)
Iron: 21 ug/dL — ABNORMAL LOW (ref 42–163)
TIBC: 179 ug/dL — ABNORMAL LOW (ref 202–409)
UIBC: 158 ug/dL (ref 117–376)

## 2014-09-21 LAB — HOLD TUBE, BLOOD BANK

## 2014-09-21 LAB — BONE MARROW EXAM

## 2014-09-21 LAB — FERRITIN CHCC: Ferritin: 230 ng/mL (ref 22–316)

## 2014-09-21 NOTE — Telephone Encounter (Signed)
Gave and printed appt sched and avs for pt for July  °

## 2014-09-21 NOTE — Telephone Encounter (Signed)
Patient tolerated procedure well.  Pressure dressing in place and patient on back for 30 minutes. Vitals 146/66, 98.6, P 67, R 22 and 0@Sat  on room air 96%. Wife at bedside post treatment. Patient encouraged to leave dressing on until AM and to call clinic back if bleeding through dressing.

## 2014-09-21 NOTE — Assessment & Plan Note (Signed)
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 He has progressive pancytopenia, currently asymptomatic. Transfusion is not indicated. I also noted significant elevated total protein and I am wondering whether there could be plasma cell disorder. I has long discussion with the patient and family members about the importance of bone marrow aspirate biopsy and he agreed to proceed today. Please see procedure note relating to this. I'll see him back next week to review test results.

## 2014-09-21 NOTE — Progress Notes (Signed)
[place] OFFICE PROGRESS NOTE  Patient Care Team: [name], MD as PCP - General (Internal Medicine) Heath Lark, MD as Consulting Physician (Hematology and Oncology)  SUMMARY OF ONCOLOGIC HISTORY:  I reviewed the patient's records extensive and collaborated the history with the patient. Summary of his history is as follows:  He is a gentleman who has been following since October 2009, with an isolated neutropenia. Bone marrow biopsy in 2010 showed an element of both myeloproliferative as well as myelodysplastic syndrome, but overall I felt that the features are consistent with myelodysplastic syndrome. He has received one Neulasta injection in the past and has helped his blood count dramatically. 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  He is back on his coumadin.  INTERVAL HISTORY: Please see below for problem oriented charting. He complained of fatigue. Denies any appetite difference or weight change. No recent infection. The patient denies any recent signs or symptoms of bleeding such as spontaneous epistaxis, hematuria or hematochezia.   REVIEW OF SYSTEMS:   Constitutional: Denies fevers, chills or abnormal weight loss Eyes: Denies blurriness of vision Ears, nose, mouth, throat, and face: Denies mucositis or sore throat Respiratory: Denies cough, dyspnea or wheezes Cardiovascular: Denies palpitation, chest discomfort or lower extremity swelling Gastrointestinal:  Denies nausea, heartburn or change in bowel habits Skin: Denies abnormal skin rashes Lymphatics: Denies new lymphadenopathy or easy bruising Neurological:Denies numbness, tingling or new weaknesses Behavioral/Psych: Mood is stable, no new changes  All other systems were reviewed with the patient and are  negative.  I have reviewed the past medical history, past surgical history, social history and family history with the patient and they are unchanged from previous note.  ALLERGIES:  has No Known Allergies.  MEDICATIONS:  Current Outpatient Prescriptions  Medication Sig Dispense Refill  . Ascorbic Acid (VITAMIN C) 1000 MG tablet Take 1,000 mg by mouth daily.    Marland Kitchen aspirin EC 81 MG tablet Take 81 mg by mouth daily.    Marland Kitchen atenolol (TENORMIN) 25 MG tablet Take 1 tablet (25 mg total) by mouth daily. 30 tablet 3  . atorvastatin (LIPITOR) 20 MG tablet Take 20 mg by mouth daily.    . Cholecalciferol (VITAMIN D) 2000 UNITS CAPS Take 1 capsule by mouth.    . diltiazem (DILACOR XR) 180 MG 24 hr capsule Take 180 mg by mouth daily.    . feeding supplement, ENSURE ENLIVE, (ENSURE ENLIVE) LIQD Take 237 mLs by mouth 2 (two) times daily between meals. 237 mL 12  . furosemide (LASIX) 20 MG tablet Take 20 mg by mouth daily.    Marland Kitchen gabapentin (NEURONTIN) 100 MG capsule Take 100 mg by mouth daily.    Marland Kitchen HYDROcodone-acetaminophen (NORCO/VICODIN) 5-325 MG per tablet Take 1 tablet by mouth 2 (two) times daily as needed. Take 1 tab bid or tid prn  0  . latanoprost (XALATAN) 0.005 % ophthalmic solution Place 1 drop into both eyes at bedtime.   4  . levETIRAcetam (KEPPRA) 500 MG tablet Take 1 tablet (500 mg total) by mouth every 12 (twelve) hours. 180 tablet 2  . MYRBETRIQ 50 MG TB24 tablet Take 50 mg by mouth daily.  3  . VASCEPA 1 G CAPS Take 1 g by mouth.  2  . warfarin (COUMADIN) 5 MG tablet Take 1 tablet (5 mg total) by mouth daily.  135 tablet 1  . Multiple Vitamins-Minerals (MENS MULTIVITAMIN PLUS PO) Take 1 tablet by mouth.     No current facility-administered medications for this visit.    PHYSICAL EXAMINATION: ECOG PERFORMANCE STATUS: 2 - Symptomatic, <50% confined to bed  Filed Vitals:   09/21/14 0915  BP: 153/63  Pulse: 78  Temp: 98.2 F (36.8 C)  Resp: 18   Filed Weights   09/21/14 0915   Weight: 227 lb 8 oz (103.193 kg)    GENERAL:alert, no distress and comfortable SKIN: skin color, texture, turgor are normal, no rashes or significant lesions EYES: normal, Conjunctiva are pink and non-injected, sclera clear OROPHARYNX:no exudate, no erythema and lips, buccal mucosa, and tongue normal  NECK: supple, thyroid normal size, non-tender, without nodularity LYMPH:  no palpable lymphadenopathy in the cervical, axillary or inguinal LUNGS: clear to auscultation and percussion with normal breathing effort HEART: regular rate & rhythm and no murmurs and no lower extremity edema ABDOMEN:abdomen soft, non-tender and normal bowel sounds Musculoskeletal:no cyanosis of digits and no clubbing  NEURO: alert & oriented x 3 with fluent speech, no focal motor/sensory deficits  LABORATORY DATA:  I have reviewed the data as listed    Component Value Date/Time   NA 138 08/14/2014 0735   NA 138 02/16/2014 1322   K 3.3* 08/14/2014 0735   K 3.7 02/16/2014 1322   CL 109 08/14/2014 0735   CL 109* 01/02/2012 1526   CO2 21* 08/14/2014 0735   CO2 24 02/16/2014 1322   GLUCOSE 85 08/14/2014 0735   GLUCOSE 78 02/16/2014 1322   GLUCOSE 118* 01/02/2012 1526   BUN 7 08/14/2014 0735   BUN 12.4 02/16/2014 1322   CREATININE 0.98 08/14/2014 0735   CREATININE 1.0 02/16/2014 1322   CALCIUM 8.3* 08/14/2014 0735   CALCIUM 8.7 02/16/2014 1322   PROT 9.7* 08/14/2014 0735   PROT 10.1* 02/16/2014 1322   ALBUMIN 2.2* 08/14/2014 0735   ALBUMIN 3.2* 02/16/2014 1322   AST 29 08/14/2014 0735   AST 22 02/16/2014 1322   ALT 18 08/14/2014 0735   ALT 14 02/16/2014 1322   ALKPHOS 47 08/14/2014 0735   ALKPHOS 69 02/16/2014 1322   BILITOT 0.5 08/14/2014 0735   BILITOT 0.40 02/16/2014 1322   GFRNONAA >60 08/14/2014 0735   GFRAA >60 08/14/2014 0735    No results found for: SPEP, UPEP  Lab Results  Component Value Date   WBC 3.5* 09/21/2014   NEUTROABS 0.6* 09/21/2014   HGB 8.7* 09/21/2014   HCT 27.9*  09/21/2014   MCV 69.1* 09/21/2014   PLT 96 Large platelets present* 09/21/2014      Chemistry      Component Value Date/Time   NA 138 08/14/2014 0735   NA 138 02/16/2014 1322   K 3.3* 08/14/2014 0735   K 3.7 02/16/2014 1322   CL 109 08/14/2014 0735   CL 109* 01/02/2012 1526   CO2 21* 08/14/2014 0735   CO2 24 02/16/2014 1322   BUN 7 08/14/2014 0735   BUN 12.4 02/16/2014 1322   CREATININE 0.98 08/14/2014 0735   CREATININE 1.0 02/16/2014 1322      Component Value Date/Time   CALCIUM 8.3* 08/14/2014 0735   CALCIUM 8.7 02/16/2014 1322   ALKPHOS 47 08/14/2014 0735   ALKPHOS 69 02/16/2014 1322   AST 29 08/14/2014 0735   AST 22 02/16/2014 1322   ALT 18 08/14/2014 0735   ALT 14 02/16/2014 1322   BILITOT 0.5 08/14/2014 0735   BILITOT 0.40 02/16/2014 1322  ASSESSMENT & PLAN:  Myelodysplastic syndrome The patient care is 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 He has progressive pancytopenia, currently asymptomatic. Transfusion is not indicated. I also noted significant elevated total protein and I am wondering whether there could be plasma cell disorder. I has long discussion with the patient and family members about the importance of bone marrow aspirate biopsy and he agreed to proceed today. Please see procedure note relating to this. I'll see him back next week to review test results.  Bone Marrow Biopsy and Aspiration Procedure Note   Informed consent was obtained and potential risks including bleeding, infection and pain were reviewed with the patient.  The patient's name, date of birth, identification, consent and allergies were verified prior to the start of procedure and time out was performed.  The left posterior iliac crest was chosen as the site of biopsy.  The skin was prepped with Betadine solution.   8 cc of 1% lidocaine was used to provide local anaesthesia.   10 cc of bone marrow aspirate was obtained followed by 1  inch biopsy.  A total of 4 attempts were made due to the patient lying on the right side making it difficult to get a good position  The procedure was tolerated well and there were no complications.  The patient was stable at the end of the procedure.  Specimens sent for flow cytometry, cytogenetics and additional studies.   All questions were answered. The patient knows to call the clinic with any problems, questions or concerns. No barriers to learning was detected. I spent 55 minutes counseling the patient face to face. The total time spent in the appointment was 60 minutes and more than 50% was on counseling and review of test results     [facility], [place], MD 09/21/2014 10:13 AM

## 2014-09-22 ENCOUNTER — Telehealth: Payer: Self-pay | Admitting: Cardiology

## 2014-09-22 LAB — LIPID PANEL
Cholesterol: 75 mg/dL (ref 0–200)
HDL: 32 mg/dL — AB (ref >=40)
LDL Cholesterol: 29 mg/dL (ref 0–99)
Total CHOL/HDL Ratio: 2.3 Ratio
Triglycerides: 68 mg/dL (ref <150)
VLDL: 14 mg/dL (ref 0–40)

## 2014-09-22 LAB — COMPREHENSIVE METABOLIC PANEL
ALT: 9 U/L (ref 0–53)
AST: 20 U/L (ref 0–37)
Albumin: 2.8 g/dL — ABNORMAL LOW (ref 3.5–5.2)
Alkaline Phosphatase: 46 U/L (ref 39–117)
BILIRUBIN TOTAL: 0.3 mg/dL (ref 0.2–1.2)
BUN: 10 mg/dL (ref 6–23)
CHLORIDE: 105 meq/L (ref 96–112)
CO2: 25 meq/L (ref 19–32)
CREATININE: 0.89 mg/dL (ref 0.50–1.35)
Calcium: 8.3 mg/dL — ABNORMAL LOW (ref 8.4–10.5)
GLUCOSE: 77 mg/dL (ref 70–99)
POTASSIUM: 3.7 meq/L (ref 3.5–5.3)
SODIUM: 135 meq/L (ref 135–145)
Total Protein: 11 g/dL — ABNORMAL HIGH (ref 6.0–8.3)

## 2014-09-22 NOTE — Telephone Encounter (Signed)
SPOKE WITH TIFFANY REPORT A HIGH TOTAL PROTEIN 11.0 ,LAST MONTH LEVEL 9.7 INFORMATION ROUTED TO Dr Ellyn Hack.

## 2014-09-22 NOTE — Telephone Encounter (Signed)
Reviewed lab result with Dr Gwenlyn Found ( D.O.D) Lab is okay at present time , okay to wait for Dr Ellyn Hack to review. Patient is also being seen by Dr [facility] - ONCOLOGY

## 2014-09-23 LAB — SEDIMENTATION RATE: SED RATE: 111 mm/h — AB (ref 0–20)

## 2014-09-23 LAB — ERYTHROPOIETIN: ERYTHROPOIETIN: 36.6 m[IU]/mL — AB (ref 2.6–18.5)

## 2014-09-23 NOTE — Telephone Encounter (Signed)
Lipids look good & chem also. T Protein elevated - being evaluated by Hem-Onc.  Leonie Man, MD

## 2014-09-25 ENCOUNTER — Ambulatory Visit (HOSPITAL_COMMUNITY)
Admission: RE | Admit: 2014-09-25 | Discharge: 2014-09-25 | Disposition: A | Discharge status: Home / Self Care | Payer: Commercial Managed Care - HMO | Source: Ambulatory Visit | Attending: Hematology and Oncology | Admitting: Hematology and Oncology

## 2014-09-25 ENCOUNTER — Telehealth: Payer: Self-pay | Admitting: *Deleted

## 2014-09-25 ENCOUNTER — Other Ambulatory Visit: Payer: Self-pay | Admitting: Hematology and Oncology

## 2014-09-25 DIAGNOSIS — M545 Low back pain: Secondary | ICD-10-CM | POA: Diagnosis present

## 2014-09-25 DIAGNOSIS — D472 Monoclonal gammopathy: Secondary | ICD-10-CM | POA: Diagnosis not present

## 2014-09-25 LAB — SPEP & IFE WITH QIG
ABNORMAL PROTEIN BAND3: NOT DETECTED g/dL
Abnormal Protein Band1: 1.4 g/dL
Abnormal Protein Band2: 2.1 g/dL
Albumin ELP: 2.8 g/dL — ABNORMAL LOW (ref 3.8–4.8)
Alpha-1-Globulin: 0.5 g/dL — ABNORMAL HIGH (ref 0.2–0.3)
Alpha-2-Globulin: 1.2 g/dL — ABNORMAL HIGH (ref 0.5–0.9)
BETA GLOBULIN: 0.4 g/dL (ref 0.4–0.6)
Beta 2: 0.4 g/dL (ref 0.2–0.5)
Gamma Globulin: 5.7 g/dL — ABNORMAL HIGH (ref 0.8–1.7)
IGG (IMMUNOGLOBIN G), SERUM: 6220 mg/dL — AB (ref 650–1600)
IGM, SERUM: 131 mg/dL (ref 41–251)
IgA: 171 mg/dL (ref 68–379)
TOTAL PROTEIN, SERUM ELECTROPHOR: 10.9 g/dL — AB (ref 6.1–8.1)

## 2014-09-25 LAB — KAPPA/LAMBDA LIGHT CHAINS
Kappa free light chain: 25.8 mg/dL — ABNORMAL HIGH (ref 0.33–1.94)
Kappa:Lambda Ratio: 2.15 — ABNORMAL HIGH (ref 0.26–1.65)
Lambda Free Lght Chn: 12 mg/dL — ABNORMAL HIGH (ref 0.57–2.63)

## 2014-09-25 NOTE — Telephone Encounter (Signed)
-----  Message from Heath Lark, MD sent at 09/25/2014  8:00 AM EDT ----- Regarding: skeletal Xray Bone marrow biopsy suggested myeloma could be a possible diagnosis. I want to order skeletal survey before he sees me on Tuesday. Order is in but can you call the family Even if he can do the Xray before his appt would be helpful  Thanks

## 2014-09-25 NOTE — Telephone Encounter (Signed)
Informed pt of new order for xray and he asked to have it done today.   Scheduled Xray for today and pt aware.

## 2014-09-28 LAB — CHROMOSOME ANALYSIS, BONE MARROW

## 2014-09-28 LAB — TISSUE HYBRIDIZATION (BONE MARROW)-NCBH

## 2014-09-28 NOTE — Telephone Encounter (Signed)
Spoke to Ms Terry Robertson. Result given . Verbalized understanding

## 2014-09-29 ENCOUNTER — Ambulatory Visit (HOSPITAL_BASED_OUTPATIENT_CLINIC_OR_DEPARTMENT_OTHER): Payer: Commercial Managed Care - HMO | Admitting: Hematology and Oncology

## 2014-09-29 ENCOUNTER — Encounter: Payer: Self-pay | Admitting: Hematology and Oncology

## 2014-09-29 VITALS — BP 151/65 | HR 76 | Temp 98.2°F | Resp 16 | Ht 65.0 in | Wt 232.0 lb

## 2014-09-29 DIAGNOSIS — D469 Myelodysplastic syndrome, unspecified: Secondary | ICD-10-CM | POA: Diagnosis not present

## 2014-09-29 DIAGNOSIS — C9000 Multiple myeloma not having achieved remission: Secondary | ICD-10-CM | POA: Diagnosis not present

## 2014-09-29 HISTORY — DX: Multiple myeloma not having achieved remission: C90.00

## 2014-09-29 MED ORDER — LENALIDOMIDE 5 MG PO CAPS: ORAL_CAPSULE | ORAL | Status: DC

## 2014-09-29 MED ORDER — DEXAMETHASONE 4 MG PO TABS: 20.0000 mg | ORAL_TABLET | ORAL | Status: DC

## 2014-09-29 NOTE — Patient Instructions (Signed)
Lenalidomide Oral Capsules What is this medicine? LENALIDOMIDE (len a LID oh mide) is a chemotherapy drug that targets specific proteins within cancer cells and stops the cancer cell from growing. It is used to treat multiple myeloma, mantle cell lymphoma, and some myelodysplastic syndromes that cause severe anemia requiring blood transfusions. This medicine may be used for other purposes; ask your health care provider or pharmacist if you have questions. COMMON BRAND NAME(S): Revlimid What should I tell my health care provider before I take this medicine? They need to know if you have any of these conditions: -blood clots in the legs or the lungs -high blood pressure -high cholesterol -infection -irregular monthly periods or menstrual cycles -kidney disease -liver disease -smoke tobacco -thyroid disease -an unusual or allergic reaction to lenalidomide, other medicines, foods, dyes, or preservatives -pregnant or trying to get pregnant -breast-feeding How should I use this medicine? Take this medicine by mouth with a glass of water. Follow the directions on the prescription label. Do not cut, crush, or chew this medicine. Take your medicine at regular intervals. Do not take it more often than directed. Do not stop taking except on your doctor's advice. A MedGuide will be given with each prescription and refill. Read this guide carefully each time. The MedGuide may change frequently. Talk to your pediatrician regarding the use of this medicine in children. Special care may be needed. Overdosage: If you think you have taken too much of this medicine contact a poison control center or emergency room at once. NOTE: This medicine is only for you. Do not share this medicine with others. What if I miss a dose? If you miss a dose, take it as soon as you can. If your next dose is to be taken in less than 12 hours, then do not take the missed dose. Take the next dose at your regular time. Do not take  double or extra doses. What may interact with this medicine? This medicine may interact with the following medications: -digoxin -medicines that increase the risk of thrombosis like estrogens or erythropoietic agents (e.g., epoetin alfa and darbepoetin alfa) -warfarin This list may not describe all possible interactions. Give your health care provider a list of all the medicines, herbs, non-prescription drugs, or dietary supplements you use. Also tell them if you smoke, drink alcohol, or use illegal drugs. Some items may interact with your medicine. What should I watch for while using this medicine? Visit your doctor for regular check ups. Tell your doctor or healthcare professional if your symptoms do not start to get better or if they get worse. You will need to have important blood work done while you are taking this medicine. This medicine is available only through a special program. Doctors, pharmacies, and patients must meet all of the conditions of the program. Your health care provider will help you get signed up with the program if you need this medicine. Through the program you will only receive up to a 28 day supply of the medicine at one time. You will need a new prescription for each refill. This medicine can cause birth defects. Do not get pregnant while taking this drug. Females with child-bearing potential will need to have 2 negative pregnancy tests before starting this medicine. Pregnancy testing must be done every 2 to 4 weeks as directed while taking this medicine. Use 2 reliable forms of birth control together while you are taking this medicine and for 1 month after you stop taking this medicine. If you  think that you might be pregnant talk to your doctor right away. Men must use a latex condom during sexual contact with a woman while taking this medicine and for 28 days after you stop taking this medicine. A latex condom is needed even if you have had a vasectomy. Contact your doctor  right away if your partner becomes pregnant. Do not donate sperm while taking this medicine and for 28 days after you stop taking this medicine. Do not give blood while taking the medicine and for 1 month after completion of treatment to avoid exposing pregnant women to the medicine through the donated blood. Talk to your doctor about your risk of cancer. You may be more at risk for certain types of cancers if you take this medicine. What side effects may I notice from receiving this medicine? Side effects that you should report to your doctor or health care professional as soon as possible: -allergic reactions like skin rash, itching or hives, swelling of the face, lips, or tongue -breathing problems -chest pain or tightness -fast, irregular heartbeat -low blood counts - this medicine may decrease the number of white blood cells, red blood cells and platelets. You may be at increased risk for infections and bleeding. -seizures -signs and symptoms of bleeding such as bloody or black, tarry stools; red or dark-brown urine; spitting up blood or brown material that looks like coffee grounds; red spots on the skin; unusual bruising or bleeding from the eye, gums, or nose -signs and symptoms of a blood clot such as breathing problems; changes in vision; chest pain; severe, sudden headache; pain, swelling, warmth in the leg; trouble speaking; sudden numbness or weakness of the face, arm or leg -signs and symptoms of liver injury like dark yellow or brown urine; general ill feeling or flu-like symptoms; light-colored stools; loss of appetite; nausea; right upper belly pain; unusually weak or tired; yellowing of the eyes or skin -signs and symptoms of a stroke like changes in vision; confusion; trouble speaking or understanding; severe headaches; sudden numbness or weakness of the face, arm or leg; trouble walking; dizziness; loss of balance or coordination -sweating -vomiting Side effects that usually do  not require medical attention (report to your doctor or health care professional if they continue or are bothersome): -constipation -cough -diarrhea -tiredness This list may not describe all possible side effects. Call your doctor for medical advice about side effects. You may report side effects to FDA at 1-800-FDA-1088. Where should I keep my medicine? Keep out of the reach of children. Store at room temperature between 15 and 30 degrees C (59 and 86 degrees F). Throw away any unused medicine after the expiration date. NOTE: This sheet is a summary. It may not cover all possible information. If you have questions about this medicine, talk to your doctor, pharmacist, or health care provider.  2015, Elsevier/Gold Standard. (2013-05-27 18:30:01) Dexamethasone tablets What is this medicine? DEXAMETHASONE (dex a METH a sone) is a corticosteroid. It is commonly used to treat inflammation of the skin, joints, lungs, and other organs. Common conditions treated include asthma, allergies, and arthritis. It is also used for other conditions, such as blood disorders and diseases of the adrenal glands. This medicine may be used for other purposes; ask your health care provider or pharmacist if you have questions. COMMON BRAND NAME(S): Decadron, DexPak Sterling Big, DexPak TaperPak, Zema-Pak What should I tell my health care provider before I take this medicine? They need to know if you have any of  these conditions: -Cushing's syndrome -diabetes -glaucoma -heart problems or disease -high blood pressure -infection like herpes, measles, tuberculosis, or chickenpox -kidney disease -liver disease -mental problems -myasthenia gravis -osteoporosis -previous heart attack -seizures -stomach, ulcer or intestine disease including colitis and diverticulitis -thyroid problem -an unusual or allergic reaction to dexamethasone, corticosteroids, other medicines, lactose, foods, dyes, or preservatives -pregnant  or trying to get pregnant -breast-feeding How should I use this medicine? Take this medicine by mouth with a drink of water. Follow the directions on the prescription label. Take it with food or milk to avoid stomach upset. If you are taking this medicine once a day, take it in the morning. Do not take more medicine than you are told to take. Do not suddenly stop taking your medicine because you may develop a severe reaction. Your doctor will tell you how much medicine to take. If your doctor wants you to stop the medicine, the dose may be slowly lowered over time to avoid any side effects. Talk to your pediatrician regarding the use of this medicine in children. Special care may be needed. Patients over 63 years old may have a stronger reaction and need a smaller dose. Overdosage: If you think you have taken too much of this medicine contact a poison control center or emergency room at once. NOTE: This medicine is only for you. Do not share this medicine with others. What if I miss a dose? If you miss a dose, take it as soon as you can. If it is almost time for your next dose, talk to your doctor or health care professional. You may need to miss a dose or take an extra dose. Do not take double or extra doses without advice. What may interact with this medicine? Do not take this medicine with any of the following medications: -mifepristone, RU-486 -vaccines This medicine may also interact with the following medications: -amphotericin B -antibiotics like clarithromycin, erythromycin, and troleandomycin -aspirin and aspirin-like drugs -barbiturates like phenobarbital -carbamazepine -cholestyramine -cholinesterase inhibitors like donepezil, galantamine, rivastigmine, and tacrine -cyclosporine -digoxin -diuretics -ephedrine -male hormones, like estrogens or progestins and birth control pills -indinavir -isoniazid -ketoconazole -medicines for diabetes -medicines that improve muscle tone  or strength for conditions like myasthenia gravis -NSAIDs, medicines for pain and inflammation, like ibuprofen or naproxen -phenytoin -rifampin -thalidomide -warfarin This list may not describe all possible interactions. Give your health care provider a list of all the medicines, herbs, non-prescription drugs, or dietary supplements you use. Also tell them if you smoke, drink alcohol, or use illegal drugs. Some items may interact with your medicine. What should I watch for while using this medicine? Visit your doctor or health care professional for regular checks on your progress. If you are taking this medicine over a prolonged period, carry an identification card with your name and address, the type and dose of your medicine, and your doctor's name and address. This medicine may increase your risk of getting an infection. Stay away from people who are sick. Tell your doctor or health care professional if you are around anyone with measles or chickenpox. If you are going to have surgery, tell your doctor or health care professional that you have taken this medicine within the last twelve months. Ask your doctor or health care professional about your diet. You may need to lower the amount of salt you eat. The medicine can increase your blood sugar. If you are a diabetic check with your doctor if you need help adjusting the dose of your  diabetic medicine. What side effects may I notice from receiving this medicine? Side effects that you should report to your doctor or health care professional as soon as possible: -allergic reactions like skin rash, itching or hives, swelling of the face, lips, or tongue -changes in vision -fever, sore throat, sneezing, cough, or other signs of infection, wounds that will not heal -increased thirst -mental depression, mood swings, mistaken feelings of self importance or of being mistreated -pain in hips, back, ribs, arms, shoulders, or legs -redness, blistering,  peeling or loosening of the skin, including inside the mouth -trouble passing urine or change in the amount of urine -swelling of feet or lower legs -unusual bleeding or bruising Side effects that usually do not require medical attention (report to your doctor or health care professional if they continue or are bothersome): -headache -nausea, vomiting -skin problems, acne, thin and shiny skin -weight gain This list may not describe all possible side effects. Call your doctor for medical advice about side effects. You may report side effects to FDA at 1-800-FDA-1088. Where should I keep my medicine? Keep out of the reach of children. Store at room temperature between 20 and 25 degrees C (68 and 77 degrees F). Protect from light. Throw away any unused medicine after the expiration date. NOTE: This sheet is a summary. It may not cover all possible information. If you have questions about this medicine, talk to your doctor, pharmacist, or health care provider.  2015, Elsevier/Gold Standard. (2007-06-13 14:02:13)

## 2014-09-30 ENCOUNTER — Telehealth: Payer: Self-pay

## 2014-09-30 ENCOUNTER — Encounter: Payer: Self-pay | Admitting: Hematology and Oncology

## 2014-09-30 NOTE — Progress Notes (Signed)
I faxed [facility] revilimid req [phone]

## 2014-09-30 NOTE — Telephone Encounter (Signed)
[facility] called stating pt revlimid needs to be filled by [place] and they will transfer all the information.

## 2014-09-30 NOTE — Assessment & Plan Note (Signed)
He has low-grade myelodysplastic syndrome along with changes suggestive of myelofibrosis. Hopefully, Revlimid and steroids will treat this as well. Currently, he is not symptomatic from the anemia, thrombocytopenia and leukopenia. We will observe for now and he does not require transfusion. I am concerned about the baseline pancytopenia and plan to start Revlimid at the very low dose. The patient is instructed to call me as as soon as he has received the medication and I will put forth order to monitor his blood count and see him on a weekly basis in the first month of treatment.

## 2014-09-30 NOTE — Assessment & Plan Note (Signed)
I reviewed the test result with the patient and his caregiver. The blood test, bone marrow biopsy, etc. suggested the patient have multiple myeloma along with myelodysplastic syndrome. The patient is quite frail. I recommend a conservative approach using dexamethasone low-dose, at 20 mg weekly by mouth along with low-dose Revlimid that will treat both low-grade myelodysplastic syndrome as well as multiple myeloma. The risks, benefit, side effects of treatment were fully discussed with the patient and he agreed to proceed. In the future, once he is better, I will consider adding Zometa as well.

## 2014-09-30 NOTE — Progress Notes (Signed)
[place] OFFICE PROGRESS NOTE  Patient Care Team: [name], MD as PCP - General (Internal Medicine) Heath Lark, MD as Consulting Physician (Hematology and Oncology)  SUMMARY OF ONCOLOGIC HISTORY:  I reviewed the patient's records extensive and collaborated the history with the patient. Summary of his history is as follows:  He is a gentleman who has been following since October 2009, with an isolated neutropenia. Bone marrow biopsy in 2010 showed an element of both myeloproliferative as well as myelodysplastic syndrome, but overall I felt that the features are consistent with myelodysplastic syndrome. He has received one Neulasta injection in the past and has helped his blood count dramatically. He was placed on observation   He was admitted to [facility] back in April (04/18-04/25) for acute hypoxic respiratory failure, left hemothorax and pneumonia complicated by hemorrhagic transformation secondary to coumadin.  He is back on his coumadin.  Accession: [id number] bone marrow biopsy dated 09/21/2014 show myelodysplastic syndrome and multiple myeloma INTERVAL HISTORY: Please see below for problem oriented charting. He feels well. He He denies any side effects from recent bone marrow biopsy. Denies any recent infection. The patient denies any recent signs or symptoms of bleeding such as spontaneous epistaxis, hematuria or hematochezia.   REVIEW OF SYSTEMS:   Constitutional: Denies fevers, chills or abnormal weight loss Eyes: Denies blurriness of vision Ears, nose, mouth, throat, and face: Denies mucositis or sore throat Respiratory: Denies cough, dyspnea or wheezes Cardiovascular: Denies palpitation, chest discomfort or lower extremity swelling Gastrointestinal:  Denies nausea, heartburn or change in bowel habits Skin: Denies abnormal skin rashes Lymphatics: Denies new lymphadenopathy  Neurological:Denies numbness, tingling or new  weaknesses Behavioral/Psych: Mood is stable, no new changes  All other systems were reviewed with the patient and are negative.  I have reviewed the past medical history, past surgical history, social history and family history with the patient and they are unchanged from previous note.  ALLERGIES:  has No Known Allergies.  MEDICATIONS:  Current Outpatient Prescriptions  Medication Sig Dispense Refill  . Ascorbic Acid (VITAMIN C) 1000 MG tablet Take 1,000 mg by mouth daily.    Marland Kitchen aspirin EC 81 MG tablet Take 81 mg by mouth daily.    Marland Kitchen atenolol (TENORMIN) 25 MG tablet Take 1 tablet (25 mg total) by mouth daily. 30 tablet 3  . atorvastatin (LIPITOR) 20 MG tablet Take 20 mg by mouth daily.    . Cholecalciferol (VITAMIN D) 2000 UNITS CAPS Take 1 capsule by mouth.    . diltiazem (DILACOR XR) 180 MG 24 hr capsule Take 180 mg by mouth daily.    . feeding supplement, ENSURE ENLIVE, (ENSURE ENLIVE) LIQD Take 237 mLs by mouth 2 (two) times daily between meals. 237 mL 12  . furosemide (LASIX) 20 MG tablet Take 20 mg by mouth daily.    Marland Kitchen gabapentin (NEURONTIN) 100 MG capsule Take 100 mg by mouth daily.    Marland Kitchen HYDROcodone-acetaminophen (NORCO/VICODIN) 5-325 MG per tablet Take 1 tablet by mouth 2 (two) times daily as needed. Take 1 tab bid or tid prn  0  . latanoprost (XALATAN) 0.005 % ophthalmic solution Place 1 drop into both eyes at bedtime.   4  . levETIRAcetam (KEPPRA) 500 MG tablet Take 1 tablet (500 mg total) by mouth every 12 (twelve) hours. 180 tablet 2  . Multiple Vitamins-Minerals (MENS MULTIVITAMIN PLUS PO) Take 1 tablet by mouth.    Marland Kitchen MYRBETRIQ 50 MG TB24 tablet Take 50 mg by mouth daily.  3  . VASCEPA 1 G CAPS Take 1 g by mouth.  2  . warfarin (COUMADIN) 5 MG tablet Take 1 tablet (5 mg total) by mouth daily. 135 tablet 1  . dexamethasone (DECADRON) 4 MG tablet Take 5 tablets (20 mg total) by mouth once a week. 60 tablet 0  . lenalidomide (REVLIMID) 5 MG capsule Take 1 capsule daily for 21  days, then off for 7 days 21 capsule 0   No current facility-administered medications for this visit.    PHYSICAL EXAMINATION: ECOG PERFORMANCE STATUS: 1 - Symptomatic but completely ambulatory  Filed Vitals:   09/29/14 1301  BP: 151/65  Pulse: 76  Temp: 98.2 F (36.8 C)  Resp: 16   Filed Weights   09/29/14 1301  Weight: 232 lb (105.235 kg)    GENERAL:alert, no distress and comfortable SKIN: skin color, texture, turgor are normal, no rashes or significant lesions EYES: normal, Conjunctiva are pink and non-injected, sclera clear Musculoskeletal:no cyanosis of digits and no clubbing  NEURO: alert & oriented x 3 with fluent speech, no focal motor/sensory deficits  LABORATORY DATA:  I have reviewed the data as listed    Component Value Date/Time   NA 135 09/21/2014 1127   NA 138 02/16/2014 1322   K 3.7 09/21/2014 1127   K 3.7 02/16/2014 1322   CL 105 09/21/2014 1127   CL 109* 01/02/2012 1526   CO2 25 09/21/2014 1127   CO2 24 02/16/2014 1322   GLUCOSE 77 09/21/2014 1127   GLUCOSE 78 02/16/2014 1322   GLUCOSE 118* 01/02/2012 1526   BUN 10 09/21/2014 1127   BUN 12.4 02/16/2014 1322   CREATININE 0.89 09/21/2014 1127   CREATININE 0.98 08/14/2014 0735   CREATININE 1.0 02/16/2014 1322   CALCIUM 8.3* 09/21/2014 1127   CALCIUM 8.7 02/16/2014 1322   PROT 11.0* 09/21/2014 1127   PROT 10.1* 02/16/2014 1322   ALBUMIN 2.8* 09/21/2014 1127   ALBUMIN 3.2* 02/16/2014 1322   AST 20 09/21/2014 1127   AST 22 02/16/2014 1322   ALT 9 09/21/2014 1127   ALT 14 02/16/2014 1322   ALKPHOS 46 09/21/2014 1127   ALKPHOS 69 02/16/2014 1322   BILITOT 0.3 09/21/2014 1127   BILITOT 0.40 02/16/2014 1322   GFRNONAA >60 08/14/2014 0735   GFRAA >60 08/14/2014 0735    No results found for: SPEP, UPEP  Lab Results  Component Value Date   WBC 3.5* 09/21/2014   NEUTROABS 0.6* 09/21/2014   HGB 8.7* 09/21/2014   HCT 27.9* 09/21/2014   MCV 69.1* 09/21/2014   PLT 96 Large platelets present*  09/21/2014      Chemistry      Component Value Date/Time   NA 135 09/21/2014 1127   NA 138 02/16/2014 1322   K 3.7 09/21/2014 1127   K 3.7 02/16/2014 1322   CL 105 09/21/2014 1127   CL 109* 01/02/2012 1526   CO2 25 09/21/2014 1127   CO2 24 02/16/2014 1322   BUN 10 09/21/2014 1127   BUN 12.4 02/16/2014 1322   CREATININE 0.89 09/21/2014 1127   CREATININE 0.98 08/14/2014 0735   CREATININE 1.0 02/16/2014 1322      Component Value Date/Time   CALCIUM 8.3* 09/21/2014 1127   CALCIUM 8.7 02/16/2014 1322   ALKPHOS 46 09/21/2014 1127   ALKPHOS 69 02/16/2014 1322   AST 20 09/21/2014 1127   AST 22 02/16/2014 1322   ALT 9 09/21/2014 1127   ALT 14 02/16/2014 1322   BILITOT 0.3 09/21/2014 1127  BILITOT 0.40 02/16/2014 1322       RADIOGRAPHIC STUDIES: I reviewed the skeletal survey I have personally reviewed the radiological images as listed and agreed with the findings in the report.   ASSESSMENT & PLAN:  Multiple myeloma I reviewed the test result with the patient and his caregiver. The blood test, bone marrow biopsy, etc. suggested the patient have multiple myeloma along with myelodysplastic syndrome. The patient is quite frail. I recommend a conservative approach using dexamethasone low-dose, at 20 mg weekly by mouth along with low-dose Revlimid that will treat both low-grade myelodysplastic syndrome as well as multiple myeloma. The risks, benefit, side effects of treatment were fully discussed with the patient and he agreed to proceed. In the future, once he is better, I will consider adding Zometa as well.  Myelodysplastic syndrome He has low-grade myelodysplastic syndrome along with changes suggestive of myelofibrosis. Hopefully, Revlimid and steroids will treat this as well. Currently, he is not symptomatic from the anemia, thrombocytopenia and leukopenia. We will observe for now and he does not require transfusion. I am concerned about the baseline pancytopenia and plan  to start Revlimid at the very low dose. The patient is instructed to call me as as soon as he has received the medication and I will put forth order to monitor his blood count and see him on a weekly basis in the first month of treatment.   No orders of the defined types were placed in this encounter.   All questions were answered. The patient knows to call the clinic with any problems, questions or concerns. No barriers to learning was detected. I spent 40 minutes counseling the patient face to face. The total time spent in the appointment was 60 minutes and more than 50% was on counseling and review of test results     [facility], Alayjah Boehringer, MD 09/30/2014 5:12 PM

## 2014-10-01 ENCOUNTER — Telehealth: Payer: Self-pay | Admitting: *Deleted

## 2014-10-01 NOTE — Telephone Encounter (Signed)
Dr. Alvy Bimler attempted to call pt's daughter and left VM several times.   No call back yet.  I attempted to call caregiver Pallis twice but the number listed either has a busy signal or "mailbox full" message.  Dr. Alvy Bimler says she will discuss pt's condition w/ family and/or caregiver on next office visit.

## 2014-10-02 ENCOUNTER — Encounter (HOSPITAL_COMMUNITY): Payer: Self-pay

## 2014-10-05 ENCOUNTER — Encounter: Payer: Self-pay | Admitting: Hematology and Oncology

## 2014-10-05 NOTE — Progress Notes (Signed)
I placed prior auth form from [employer]revlimid on desk of nurse for dr. Alvy Bimler.

## 2014-10-06 ENCOUNTER — Encounter: Payer: Self-pay | Admitting: Hematology and Oncology

## 2014-10-06 ENCOUNTER — Telehealth: Payer: Self-pay | Admitting: *Deleted

## 2014-10-06 NOTE — Progress Notes (Signed)
Per [employer] revlimid was approved until 04/04/15. I will send to medical records

## 2014-10-06 NOTE — Telephone Encounter (Signed)
Terry Robertson called asking for [facility] to "call her at [phone] when Revlimid shipment is ready for pick up.  He has the dexamethasone."  Advised that Prior authorization by [facility] is required.  Will notify Dr. Alvy Bimler nurse of this request as [facility] will deliver shipment to collaborative nurse.

## 2014-10-08 ENCOUNTER — Telehealth: Payer: Self-pay | Admitting: *Deleted

## 2014-10-08 NOTE — Telephone Encounter (Signed)
INFORMED Terry Robertson THAT NO ONE FROM THE [facility] CALLED PT. YESTERDAY. WHEN PT.'S REVLIMID IS DELIVERED TO [facility] DR.GORSUCH'S NURSE WILL CALL HER.

## 2014-10-12 ENCOUNTER — Telehealth: Payer: Self-pay | Admitting: *Deleted

## 2014-10-12 NOTE — Telephone Encounter (Signed)
Pt's friend, Pallis, called states pt has not heard anything about his Revlimid yet.   It was approved at end of last week.   I called [place] and s/w Erin who states they have attempted to reach pt by phone several times.  They will not deliver med to our office until they can s/w and "counsel patient" by phone.   Pt's co-pay is $2,575.00.   Junie Panning says they can help pt apply for co-pay assistance but they do need him to call them at Phone #[phone],  Option #1 and then option #5.   Asked Junie Panning to please let us know when able to deliver medication as pt requests it be delivered to our office. Gave her number to [employer]. Informed Pallis of above and she says she will help pt call pharmacy.

## 2014-10-12 NOTE — Telephone Encounter (Signed)
VM message received from [facility] regarding Revlimid.  She is asking if it is available for pick up?

## 2014-10-12 NOTE — Telephone Encounter (Signed)
Opened in error

## 2014-10-13 ENCOUNTER — Telehealth: Payer: Self-pay | Admitting: *Deleted

## 2014-10-13 NOTE — Telephone Encounter (Signed)
[place] called needing consent to ship Revlimid prescription to [facility] office. Gave consent and address.

## 2014-10-14 ENCOUNTER — Telehealth: Payer: Self-pay | Admitting: *Deleted

## 2014-10-14 ENCOUNTER — Telehealth: Payer: Self-pay | Admitting: Hematology and Oncology

## 2014-10-14 ENCOUNTER — Other Ambulatory Visit: Payer: Self-pay | Admitting: Hematology and Oncology

## 2014-10-14 ENCOUNTER — Ambulatory Visit (INDEPENDENT_AMBULATORY_CARE_PROVIDER_SITE_OTHER): Payer: Commercial Managed Care - HMO | Admitting: Pharmacist Clinician (PhC)/ Clinical Pharmacy Specialist

## 2014-10-14 DIAGNOSIS — I639 Cerebral infarction, unspecified: Secondary | ICD-10-CM

## 2014-10-14 DIAGNOSIS — C9000 Multiple myeloma not having achieved remission: Secondary | ICD-10-CM

## 2014-10-14 DIAGNOSIS — Z7901 Long term (current) use of anticoagulants: Secondary | ICD-10-CM | POA: Diagnosis not present

## 2014-10-14 DIAGNOSIS — D469 Myelodysplastic syndrome, unspecified: Secondary | ICD-10-CM

## 2014-10-14 DIAGNOSIS — I4891 Unspecified atrial fibrillation: Secondary | ICD-10-CM

## 2014-10-14 LAB — POCT INR: INR: 3

## 2014-10-14 NOTE — Telephone Encounter (Signed)
Revlimid 5 mg delivered to our office today.  Pt's son, Laurey Arrow and girlfriend, Pallis, came to office to pick up medication.  They also got new schedule for pt's weekly labs and f/u w/ Dr. Alvy Bimler at the end of this month.  Reviewed medication instructions w/ son and girlfriend.  They will start pt on Revlimid tomorrow morning and verbalized understanding to call for any problems or concerns.

## 2014-10-14 NOTE — Telephone Encounter (Signed)
per pof to sch pt appt-gave pt copy of avs °

## 2014-10-20 ENCOUNTER — Telehealth: Payer: Self-pay | Admitting: *Deleted

## 2014-10-20 ENCOUNTER — Other Ambulatory Visit (HOSPITAL_BASED_OUTPATIENT_CLINIC_OR_DEPARTMENT_OTHER): Payer: PRIVATE HEALTH INSURANCE

## 2014-10-20 DIAGNOSIS — D469 Myelodysplastic syndrome, unspecified: Secondary | ICD-10-CM | POA: Diagnosis not present

## 2014-10-20 DIAGNOSIS — C9000 Multiple myeloma not having achieved remission: Secondary | ICD-10-CM

## 2014-10-20 LAB — COMPREHENSIVE METABOLIC PANEL (CC13)
ALT: 21 U/L (ref 0–55)
ANION GAP: 7 meq/L (ref 3–11)
AST: 17 U/L (ref 5–34)
Albumin: 2.7 g/dL — ABNORMAL LOW (ref 3.5–5.0)
Alkaline Phosphatase: 58 U/L (ref 40–150)
BUN: 13.8 mg/dL (ref 7.0–26.0)
CALCIUM: 8.4 mg/dL (ref 8.4–10.4)
CO2: 24 mEq/L (ref 22–29)
CREATININE: 1 mg/dL (ref 0.7–1.3)
Chloride: 107 mEq/L (ref 98–109)
EGFR: 77 mL/min/{1.73_m2} — ABNORMAL LOW (ref >90)
Glucose: 127 mg/dl (ref 70–140)
POTASSIUM: 3.4 meq/L — AB (ref 3.5–5.1)
Sodium: 138 mEq/L (ref 136–145)
Total Bilirubin: 0.34 mg/dL (ref 0.20–1.20)
Total Protein: 9.6 g/dL — ABNORMAL HIGH (ref 6.4–8.3)

## 2014-10-20 LAB — CBC WITH DIFFERENTIAL/PLATELET
BASO%: 0 % (ref 0.0–2.0)
Basophils Absolute: 0 10*3/uL (ref 0.0–0.1)
EOS ABS: 0 10*3/uL (ref 0.0–0.5)
EOS%: 0 % (ref 0.0–7.0)
HEMATOCRIT: 34.6 % — AB (ref 38.4–49.9)
HGB: 10.4 g/dL — ABNORMAL LOW (ref 13.0–17.1)
LYMPH%: 41.7 % (ref 14.0–49.0)
MCH: 23.5 pg — AB (ref 27.2–33.4)
MCHC: 30.1 g/dL — AB (ref 32.0–36.0)
MCV: 78.1 fL — AB (ref 79.3–98.0)
MONO#: 1.1 10*3/uL — ABNORMAL HIGH (ref 0.1–0.9)
MONO%: 35.6 % — ABNORMAL HIGH (ref 0.0–14.0)
NEUT#: 0.7 10*3/uL — ABNORMAL LOW (ref 1.5–6.5)
NEUT%: 22.7 % — AB (ref 39.0–75.0)
PLATELETS: 88 10*3/uL — AB (ref 140–400)
RBC: 4.43 10*6/uL (ref 4.20–5.82)
RDW: 25.7 % — ABNORMAL HIGH (ref 11.0–14.6)
WBC: 3.1 10*3/uL — ABNORMAL LOW (ref 4.0–10.3)
lymph#: 1.3 10*3/uL (ref 0.9–3.3)
nRBC: 0 % (ref 0–0)

## 2014-10-20 NOTE — Telephone Encounter (Signed)
Med has been picked up and started 10-15-2014 per notes.

## 2014-10-20 NOTE — Telephone Encounter (Signed)
Pt notified of message below.

## 2014-10-20 NOTE — Telephone Encounter (Signed)
-----   Message from Heath Lark, MD sent at 10/20/2014 10:36 AM EDT ----- Regarding: labs Pls call his family/caregiver his labs looks great Continue same Rx with revlimid ----- Message -----    From: [facility] Interface    Sent: 10/20/2014   9:59 AM      To: Heath Lark, MD

## 2014-10-27 ENCOUNTER — Other Ambulatory Visit (HOSPITAL_BASED_OUTPATIENT_CLINIC_OR_DEPARTMENT_OTHER): Payer: Commercial Managed Care - HMO

## 2014-10-27 DIAGNOSIS — C9000 Multiple myeloma not having achieved remission: Secondary | ICD-10-CM

## 2014-10-27 DIAGNOSIS — D469 Myelodysplastic syndrome, unspecified: Secondary | ICD-10-CM

## 2014-10-27 LAB — CBC WITH DIFFERENTIAL/PLATELET
BASO%: 0 % (ref 0.0–2.0)
Basophils Absolute: 0 10*3/uL (ref 0.0–0.1)
EOS ABS: 0 10*3/uL (ref 0.0–0.5)
EOS%: 0.3 % (ref 0.0–7.0)
HCT: 36.2 % — ABNORMAL LOW (ref 38.4–49.9)
HGB: 11 g/dL — ABNORMAL LOW (ref 13.0–17.1)
LYMPH%: 41.3 % (ref 14.0–49.0)
MCH: 24.1 pg — ABNORMAL LOW (ref 27.2–33.4)
MCHC: 30.4 g/dL — ABNORMAL LOW (ref 32.0–36.0)
MCV: 79.2 fL — ABNORMAL LOW (ref 79.3–98.0)
MONO#: 1.1 10*3/uL — AB (ref 0.1–0.9)
MONO%: 35.2 % — AB (ref 0.0–14.0)
NEUT#: 0.7 10*3/uL — ABNORMAL LOW (ref 1.5–6.5)
NEUT%: 23.2 % — ABNORMAL LOW (ref 39.0–75.0)
NRBC: 0 % (ref 0–0)
PLATELETS: 95 10*3/uL — AB (ref 140–400)
RBC: 4.57 10*6/uL (ref 4.20–5.82)
RDW: 25.7 % — AB (ref 11.0–14.6)
WBC: 3.1 10*3/uL — AB (ref 4.0–10.3)
lymph#: 1.3 10*3/uL (ref 0.9–3.3)

## 2014-10-27 LAB — COMPREHENSIVE METABOLIC PANEL (CC13)
ALT: 26 U/L (ref 0–55)
ANION GAP: 5 meq/L (ref 3–11)
AST: 19 U/L (ref 5–34)
Albumin: 2.6 g/dL — ABNORMAL LOW (ref 3.5–5.0)
Alkaline Phosphatase: 52 U/L (ref 40–150)
BILIRUBIN TOTAL: 0.34 mg/dL (ref 0.20–1.20)
BUN: 10.9 mg/dL (ref 7.0–26.0)
CO2: 26 mEq/L (ref 22–29)
Calcium: 8.2 mg/dL — ABNORMAL LOW (ref 8.4–10.4)
Chloride: 110 mEq/L — ABNORMAL HIGH (ref 98–109)
Creatinine: 1 mg/dL (ref 0.7–1.3)
EGFR: 85 mL/min/{1.73_m2} — ABNORMAL LOW (ref >90)
GLUCOSE: 111 mg/dL (ref 70–140)
Potassium: 3.5 mEq/L (ref 3.5–5.1)
Sodium: 141 mEq/L (ref 136–145)
TOTAL PROTEIN: 8.7 g/dL — AB (ref 6.4–8.3)

## 2014-10-27 LAB — TECHNOLOGIST REVIEW

## 2014-11-03 ENCOUNTER — Ambulatory Visit (HOSPITAL_BASED_OUTPATIENT_CLINIC_OR_DEPARTMENT_OTHER): Payer: Commercial Managed Care - HMO | Admitting: Hematology and Oncology

## 2014-11-03 ENCOUNTER — Telehealth: Payer: Self-pay | Admitting: Hematology and Oncology

## 2014-11-03 ENCOUNTER — Encounter: Payer: Self-pay | Admitting: Hematology and Oncology

## 2014-11-03 ENCOUNTER — Other Ambulatory Visit (HOSPITAL_BASED_OUTPATIENT_CLINIC_OR_DEPARTMENT_OTHER): Payer: Commercial Managed Care - HMO

## 2014-11-03 VITALS — BP 166/73 | HR 75 | Temp 98.3°F | Resp 18 | Ht 65.0 in | Wt 232.5 lb

## 2014-11-03 DIAGNOSIS — C9000 Multiple myeloma not having achieved remission: Secondary | ICD-10-CM

## 2014-11-03 DIAGNOSIS — I10 Essential (primary) hypertension: Secondary | ICD-10-CM

## 2014-11-03 DIAGNOSIS — D469 Myelodysplastic syndrome, unspecified: Secondary | ICD-10-CM

## 2014-11-03 LAB — COMPREHENSIVE METABOLIC PANEL (CC13)
ALT: 25 U/L (ref 0–55)
ANION GAP: 6 meq/L (ref 3–11)
AST: 21 U/L (ref 5–34)
Albumin: 2.7 g/dL — ABNORMAL LOW (ref 3.5–5.0)
Alkaline Phosphatase: 58 U/L (ref 40–150)
BUN: 10.9 mg/dL (ref 7.0–26.0)
CALCIUM: 8.4 mg/dL (ref 8.4–10.4)
CHLORIDE: 105 meq/L (ref 98–109)
CO2: 27 mEq/L (ref 22–29)
CREATININE: 0.9 mg/dL (ref 0.7–1.3)
Glucose: 106 mg/dl (ref 70–140)
POTASSIUM: 3.3 meq/L — AB (ref 3.5–5.1)
Sodium: 138 mEq/L (ref 136–145)
Total Bilirubin: 0.29 mg/dL (ref 0.20–1.20)
Total Protein: 8.9 g/dL — ABNORMAL HIGH (ref 6.4–8.3)

## 2014-11-03 LAB — CBC WITH DIFFERENTIAL/PLATELET
BASO%: 0 % (ref 0.0–2.0)
Basophils Absolute: 0 10*3/uL (ref 0.0–0.1)
EOS ABS: 0 10*3/uL (ref 0.0–0.5)
EOS%: 0.4 % (ref 0.0–7.0)
HEMATOCRIT: 36.9 % — AB (ref 38.4–49.9)
HGB: 11.3 g/dL — ABNORMAL LOW (ref 13.0–17.1)
LYMPH%: 46.5 % (ref 14.0–49.0)
MCH: 24.5 pg — AB (ref 27.2–33.4)
MCHC: 30.6 g/dL — AB (ref 32.0–36.0)
MCV: 80 fL (ref 79.3–98.0)
MONO#: 0.9 10*3/uL (ref 0.1–0.9)
MONO%: 33.7 % — AB (ref 0.0–14.0)
NEUT#: 0.5 10*3/uL — CL (ref 1.5–6.5)
NEUT%: 19.4 % — AB (ref 39.0–75.0)
PLATELETS: 101 10*3/uL — AB (ref 140–400)
RBC: 4.61 10*6/uL (ref 4.20–5.82)
RDW: 25 % — ABNORMAL HIGH (ref 11.0–14.6)
WBC: 2.7 10*3/uL — ABNORMAL LOW (ref 4.0–10.3)
lymph#: 1.3 10*3/uL (ref 0.9–3.3)
nRBC: 0 % (ref 0–0)

## 2014-11-03 LAB — TECHNOLOGIST REVIEW

## 2014-11-03 NOTE — Assessment & Plan Note (Signed)
He tolerated treatment well and has improvement of anemia. He remained persistently pancytopenic with neutropenia and thrombocytopenia but is not symptomatic with infection. We will continue low-dose treatment until his next visit a month from now.

## 2014-11-03 NOTE — Telephone Encounter (Signed)
Gave adn pritned appt sched and avs for pt for SEpt

## 2014-11-03 NOTE — Assessment & Plan Note (Signed)
His blood pressure is high which I suspect that that could be due to fluid retention from dexamethasone. I will start initiate dexamethasone taper and will monitor his blood pressure carefully

## 2014-11-03 NOTE — Assessment & Plan Note (Addendum)
He is doing well with virtually no side effects from treatment. I will start initiation of dexamethasone taper. He will continue on low dose 5mg  weekly due to significant pancytopenia. I plan to add Zometa in the future

## 2014-11-03 NOTE — Progress Notes (Signed)
[place] OFFICE PROGRESS NOTE  Patient Care Team: [name], MD as PCP - General (Internal Medicine) Heath Lark, MD as Consulting Physician (Hematology and Oncology)  SUMMARY OF ONCOLOGIC HISTORY:   Myelodysplastic syndrome   08/05/2011 Initial Diagnosis Myelodysplastic syndrome   10/15/2014 -  Chemotherapy He started taking Revlimid weekly along with dexamethasone weekly   I reviewed the patient's records extensive and collaborated the history with the patient. Summary of his history is as follows:  He is a gentleman who has been following since October 2009, with an isolated neutropenia. Bone marrow biopsy in 2010 showed an element of both myeloproliferative as well as myelodysplastic syndrome, but overall I felt that the features are consistent with myelodysplastic syndrome. He has received one Neulasta injection in the past and has helped his blood count dramatically. He was placed on observation   He was admitted to [facility] back in April (04/18-04/25) for acute hypoxic respiratory failure, left hemothorax and pneumonia complicated by hemorrhagic transformation secondary to coumadin.  He is back on his coumadin.  Accession: [id number] bone marrow biopsy dated 09/21/2014 show myelodysplastic syndrome and multiple myeloma INTERVAL HISTORY: Please see below for problem oriented charting. He returns for further follow-up. He denies side effects from recent treatment. The patient denies any recent signs or symptoms of bleeding such as spontaneous epistaxis, hematuria or hematochezia. No recent infection  REVIEW OF SYSTEMS:   Constitutional: Denies fevers, chills or abnormal weight loss Eyes: Denies blurriness of vision Ears, nose, mouth, throat, and face: Denies mucositis or sore throat Respiratory: Denies cough, dyspnea or wheezes Cardiovascular: Denies palpitation, chest discomfort or lower extremity swelling Gastrointestinal:  Denies nausea, heartburn  or change in bowel habits Skin: Denies abnormal skin rashes Lymphatics: Denies new lymphadenopathy or easy bruising Neurological:Denies numbness, tingling or new weaknesses Behavioral/Psych: Mood is stable, no new changes  All other systems were reviewed with the patient and are negative.  I have reviewed the past medical history, past surgical history, social history and family history with the patient and they are unchanged from previous note.  ALLERGIES:  has No Known Allergies.  MEDICATIONS:  Current Outpatient Prescriptions  Medication Sig Dispense Refill  . Ascorbic Acid (VITAMIN C) 1000 MG tablet Take 1,000 mg by mouth daily.    Marland Kitchen aspirin EC 81 MG tablet Take 81 mg by mouth daily.    Marland Kitchen atenolol (TENORMIN) 25 MG tablet Take 1 tablet (25 mg total) by mouth daily. 30 tablet 3  . atorvastatin (LIPITOR) 20 MG tablet Take 20 mg by mouth daily.    . Cholecalciferol (VITAMIN D) 2000 UNITS CAPS Take 1 capsule by mouth.    . dexamethasone (DECADRON) 4 MG tablet Take 5 tablets (20 mg total) by mouth once a week. 60 tablet 0  . diltiazem (DILACOR XR) 180 MG 24 hr capsule Take 180 mg by mouth daily.    . feeding supplement, ENSURE ENLIVE, (ENSURE ENLIVE) LIQD Take 237 mLs by mouth 2 (two) times daily between meals. 237 mL 12  . furosemide (LASIX) 20 MG tablet Take 20 mg by mouth daily.    Marland Kitchen gabapentin (NEURONTIN) 100 MG capsule Take 100 mg by mouth daily.    Marland Kitchen HYDROcodone-acetaminophen (NORCO/VICODIN) 5-325 MG per tablet Take 1 tablet by mouth 2 (two) times daily as needed. Take 1 tab bid or tid prn  0  . latanoprost (XALATAN) 0.005 % ophthalmic solution Place 1 drop into both eyes at bedtime.   4  . lenalidomide (REVLIMID) 5  MG capsule Take 1 capsule daily for 21 days, then off for 7 days 21 capsule 0  . levETIRAcetam (KEPPRA) 500 MG tablet Take 1 tablet (500 mg total) by mouth every 12 (twelve) hours. 180 tablet 2  . Multiple Vitamins-Minerals (MENS MULTIVITAMIN PLUS PO) Take 1 tablet by  mouth.    Marland Kitchen MYRBETRIQ 50 MG TB24 tablet Take 50 mg by mouth daily.  3  . VASCEPA 1 G CAPS Take 1 g by mouth.  2  . warfarin (COUMADIN) 5 MG tablet Take 1 tablet (5 mg total) by mouth daily. 135 tablet 1   No current facility-administered medications for this visit.    PHYSICAL EXAMINATION: ECOG PERFORMANCE STATUS: 1 - Symptomatic but completely ambulatory  Filed Vitals:   11/03/14 1245  BP: 166/73  Pulse: 75  Temp: 98.3 F (36.8 C)  Resp: 18   Filed Weights   11/03/14 1245  Weight: 232 lb 8 oz (105.461 kg)    GENERAL:alert, no distress and comfortable SKIN: skin color, texture, turgor are normal, no rashes or significant lesions EYES: normal, Conjunctiva are pink and non-injected, sclera clear Musculoskeletal:no cyanosis of digits and no clubbing  NEURO: alert & oriented x 3 with fluent speech, no focal motor/sensory deficits  LABORATORY DATA:  I have reviewed the data as listed    Component Value Date/Time   NA 138 11/03/2014 1219   NA 135 09/21/2014 1127   K 3.3* 11/03/2014 1219   K 3.7 09/21/2014 1127   CL 105 09/21/2014 1127   CL 109* 01/02/2012 1526   CO2 27 11/03/2014 1219   CO2 25 09/21/2014 1127   GLUCOSE 106 11/03/2014 1219   GLUCOSE 77 09/21/2014 1127   GLUCOSE 118* 01/02/2012 1526   BUN 10.9 11/03/2014 1219   BUN 10 09/21/2014 1127   CREATININE 0.9 11/03/2014 1219   CREATININE 0.89 09/21/2014 1127   CREATININE 0.98 08/14/2014 0735   CALCIUM 8.4 11/03/2014 1219   CALCIUM 8.3* 09/21/2014 1127   PROT 8.9* 11/03/2014 1219   PROT 11.0* 09/21/2014 1127   ALBUMIN 2.7* 11/03/2014 1219   ALBUMIN 2.8* 09/21/2014 1127   AST 21 11/03/2014 1219   AST 20 09/21/2014 1127   ALT 25 11/03/2014 1219   ALT 9 09/21/2014 1127   ALKPHOS 58 11/03/2014 1219   ALKPHOS 46 09/21/2014 1127   BILITOT 0.29 11/03/2014 1219   BILITOT 0.3 09/21/2014 1127   GFRNONAA >60 08/14/2014 0735   GFRAA >60 08/14/2014 0735    No results found for: SPEP, UPEP  Lab Results   Component Value Date   WBC 2.7* 11/03/2014   NEUTROABS 0.5* 11/03/2014   HGB 11.3* 11/03/2014   HCT 36.9* 11/03/2014   MCV 80.0 11/03/2014   PLT 101* 11/03/2014      Chemistry      Component Value Date/Time   NA 138 11/03/2014 1219   NA 135 09/21/2014 1127   K 3.3* 11/03/2014 1219   K 3.7 09/21/2014 1127   CL 105 09/21/2014 1127   CL 109* 01/02/2012 1526   CO2 27 11/03/2014 1219   CO2 25 09/21/2014 1127   BUN 10.9 11/03/2014 1219   BUN 10 09/21/2014 1127   CREATININE 0.9 11/03/2014 1219   CREATININE 0.89 09/21/2014 1127   CREATININE 0.98 08/14/2014 0735      Component Value Date/Time   CALCIUM 8.4 11/03/2014 1219   CALCIUM 8.3* 09/21/2014 1127   ALKPHOS 58 11/03/2014 1219   ALKPHOS 46 09/21/2014 1127   AST 21 11/03/2014 1219  AST 20 09/21/2014 1127   ALT 25 11/03/2014 1219   ALT 9 09/21/2014 1127   BILITOT 0.29 11/03/2014 1219   BILITOT 0.3 09/21/2014 1127      ASSESSMENT & PLAN:  Multiple myeloma He is doing well with virtually no side effects from treatment. I will start initiation of dexamethasone taper. He will continue on low dose 47m weekly due to significant pancytopenia. I plan to add Zometa in the future  Myelodysplastic syndrome He tolerated treatment well and has improvement of anemia. He remained persistently pancytopenic with neutropenia and thrombocytopenia but is not symptomatic with infection. We will continue low-dose treatment until his next visit a month from now.  Essential hypertension His blood pressure is high which I suspect that that could be due to fluid retention from dexamethasone. I will start initiate dexamethasone taper and will monitor his blood pressure carefully   No orders of the defined types were placed in this encounter.   All questions were answered. The patient knows to call the clinic with any problems, questions or concerns. No barriers to learning was detected. I spent 15 minutes counseling the patient face to  face. The total time spent in the appointment was 20 minutes and more than 50% was on counseling and review of test results     [facility] Aubria Vanecek, MD 11/03/2014 1:42 PM

## 2014-11-11 ENCOUNTER — Ambulatory Visit (INDEPENDENT_AMBULATORY_CARE_PROVIDER_SITE_OTHER): Payer: Commercial Managed Care - HMO | Admitting: Pharmacist Clinician (PhC)/ Clinical Pharmacy Specialist

## 2014-11-11 DIAGNOSIS — Z7901 Long term (current) use of anticoagulants: Secondary | ICD-10-CM

## 2014-11-11 DIAGNOSIS — I639 Cerebral infarction, unspecified: Secondary | ICD-10-CM

## 2014-11-11 DIAGNOSIS — I4891 Unspecified atrial fibrillation: Secondary | ICD-10-CM | POA: Diagnosis not present

## 2014-11-11 LAB — POCT INR: INR: 3.2

## 2014-11-12 ENCOUNTER — Telehealth: Payer: Self-pay | Admitting: *Deleted

## 2014-11-12 NOTE — Telephone Encounter (Deleted)
Notified [place] that Revlimid is on hold until after BMBx results.  Will send new rx when/if Revlimid is resumed.

## 2014-11-12 NOTE — Telephone Encounter (Signed)
Opened in error.   Duplicate note.

## 2014-11-12 NOTE — Telephone Encounter (Addendum)
[place] was notified that Revlimid dose is decreased to once a week for now and no need for refill at this time.  We will send new refill when needed.

## 2014-11-17 ENCOUNTER — Encounter (HOSPITAL_COMMUNITY): Payer: Self-pay | Admitting: Emergency Medicine

## 2014-11-17 ENCOUNTER — Emergency Department (HOSPITAL_COMMUNITY)
Admission: EM | Admit: 2014-11-17 | Discharge: 2014-11-17 | Disposition: A | Discharge status: Home / Self Care | Payer: PRIVATE HEALTH INSURANCE | Attending: Emergency Medicine | Admitting: Emergency Medicine

## 2014-11-17 ENCOUNTER — Emergency Department (HOSPITAL_COMMUNITY): Payer: PRIVATE HEALTH INSURANCE

## 2014-11-17 DIAGNOSIS — X58XXXA Exposure to other specified factors, initial encounter: Secondary | ICD-10-CM | POA: Diagnosis not present

## 2014-11-17 DIAGNOSIS — Z87891 Personal history of nicotine dependence: Secondary | ICD-10-CM | POA: Diagnosis not present

## 2014-11-17 DIAGNOSIS — S93401A Sprain of unspecified ligament of right ankle, initial encounter: Secondary | ICD-10-CM | POA: Diagnosis not present

## 2014-11-17 DIAGNOSIS — Y9289 Other specified places as the place of occurrence of the external cause: Secondary | ICD-10-CM | POA: Insufficient documentation

## 2014-11-17 DIAGNOSIS — Z79899 Other long term (current) drug therapy: Secondary | ICD-10-CM | POA: Insufficient documentation

## 2014-11-17 DIAGNOSIS — Z87438 Personal history of other diseases of male genital organs: Secondary | ICD-10-CM | POA: Diagnosis not present

## 2014-11-17 DIAGNOSIS — Y9389 Activity, other specified: Secondary | ICD-10-CM | POA: Insufficient documentation

## 2014-11-17 DIAGNOSIS — G4733 Obstructive sleep apnea (adult) (pediatric): Secondary | ICD-10-CM | POA: Diagnosis not present

## 2014-11-17 DIAGNOSIS — Z86018 Personal history of other benign neoplasm: Secondary | ICD-10-CM | POA: Diagnosis not present

## 2014-11-17 DIAGNOSIS — I509 Heart failure, unspecified: Secondary | ICD-10-CM | POA: Diagnosis not present

## 2014-11-17 DIAGNOSIS — Y998 Other external cause status: Secondary | ICD-10-CM | POA: Insufficient documentation

## 2014-11-17 DIAGNOSIS — G40909 Epilepsy, unspecified, not intractable, without status epilepticus: Secondary | ICD-10-CM | POA: Diagnosis not present

## 2014-11-17 DIAGNOSIS — I10 Essential (primary) hypertension: Secondary | ICD-10-CM | POA: Insufficient documentation

## 2014-11-17 DIAGNOSIS — Z9981 Dependence on supplemental oxygen: Secondary | ICD-10-CM | POA: Insufficient documentation

## 2014-11-17 DIAGNOSIS — Z8673 Personal history of transient ischemic attack (TIA), and cerebral infarction without residual deficits: Secondary | ICD-10-CM | POA: Insufficient documentation

## 2014-11-17 DIAGNOSIS — Z8579 Personal history of other malignant neoplasms of lymphoid, hematopoietic and related tissues: Secondary | ICD-10-CM | POA: Diagnosis not present

## 2014-11-17 DIAGNOSIS — E119 Type 2 diabetes mellitus without complications: Secondary | ICD-10-CM | POA: Insufficient documentation

## 2014-11-17 DIAGNOSIS — Z9861 Coronary angioplasty status: Secondary | ICD-10-CM | POA: Diagnosis not present

## 2014-11-17 DIAGNOSIS — Z7982 Long term (current) use of aspirin: Secondary | ICD-10-CM | POA: Insufficient documentation

## 2014-11-17 DIAGNOSIS — Z7901 Long term (current) use of anticoagulants: Secondary | ICD-10-CM | POA: Diagnosis not present

## 2014-11-17 DIAGNOSIS — Z8709 Personal history of other diseases of the respiratory system: Secondary | ICD-10-CM | POA: Diagnosis not present

## 2014-11-17 DIAGNOSIS — S8991XA Unspecified injury of right lower leg, initial encounter: Secondary | ICD-10-CM | POA: Diagnosis present

## 2014-11-17 NOTE — Progress Notes (Signed)
Orthopedic Tech Progress Note Patient Details:  Terry Robertson. [birth year] [id number]  Ortho Devices Type of Ortho Device: ASO Ortho Device/Splint Location: RLE Ortho Device/Splint Interventions: Ordered, Application   Braulio Bosch 11/17/2014, 3:46 PM

## 2014-11-17 NOTE — ED Provider Notes (Signed)
CSN: [id number]     Arrival date & time 11/17/14  1201 History   First MD Initiated Contact with Patient 11/17/14 1407     Chief Complaint  Patient presents with  . Leg Pain     (Consider location/radiation/quality/duration/timing/severity/associated sxs/prior Treatment) The history is provided by the patient.     Patient is an 81 year old male, presents to the ER with right ankle pain, swelling and bruising for 3 days.  It is worse with palpation or movement. He states that he can stand up, however he cannot bear his full weight on his right leg due to pain. His son brought him in an ankle brace, without any improvement. He denies any injury or fall. His family member at the bedside reports that he got a new lift chair and that he might have injured it using that.  He does not have any other pain in his knee or right hip, and has no other complaints at this time.  He uses a cane to ambulate. He does have a walker available at home.  Past Medical History  Diagnosis Date  . Diabetes mellitus   . Essential hypertension   . Stroke 08/2011, 03/2012    L MCA (Afib RVR while admitted for hypotension) - expressive aphasia (almost totally recovered)  . Seizures     Post CVA; on Keppra; stable  . OSA on CPAP   . Paroxysmal atrial fibrillation     on coumadin; Diagnosed at the time of his 1st CVA  . Obesity, morbid     BMI  . ST-segment elevation myocardial infarction (STEMI) of inferior wall 1997    inferior MI with left circumflex stent  . CAD S/P percutaneous coronary angioplasty 1997    status post remote PCI to the LAD and circumflex in 1997, heart cath in 2008 showed widely patent stents in the circumflex and LAD.  RCA had a mid lesion and then was totally occluded distally.  . Myelodysplastic syndrome     w/mild anemia an neutropenia  . BPH (benign prostatic hyperplasia)     TURP in 12/2010  . CHF (congestive heart failure)   . Asbestosis     family unclear where he was evaluated in  the past  . Aortic stenosis, mild 08/10/2014    : EF 60-65%, mod Conc LVH, cannot assess Diastolic Fxn.  Mild AS. Mild MVP without MR., Mod LA/RA dilation.  Marland Kitchen MGUS (monoclonal gammopathy of unknown significance) 09/21/2014  . Multiple myeloma 09/29/2014   Past Surgical History  Procedure Laterality Date  . Transthoracic echocardiogram  08/06/11; 08/2014    a) EF 55% to 65% with grade2 DD/pseudonormal w/ mildly dilated LA with high LAP/LVEDP; b) EF 60-65%, mod Conc LVH, cannot assess Diastolic Fxn.  Mild AS. Mild MVP without MR., Mod LA/RA dilation.  . Cardiac catheterization  02/08/07    widely patent stent in the circumflex and LAD.  RCA had a mid lesion and then was totally occluded distally. Cook bare-metal 3.5x20 mm stent  . Transurethral resection of prostate  12/2010  . Nm myoview ltd  01/09/1999    Inferior wall thinning with possible mild ischemia versus infarct. This would correlate well with his known RCA occlusion   Family History  Problem Relation Age of Onset  . Heart disease Mother   . Heart attack Father 32   Social History  Substance Use Topics  . Smoking status: Former Smoker    Types: Cigarettes    Quit date: 03/08/1988  . Smokeless  tobacco: Never Used  . Alcohol Use: No    Review of Systems  Constitutional: Negative.   HENT: Negative.   Respiratory: Negative.   Cardiovascular: Negative.   Gastrointestinal: Negative.   Genitourinary: Negative.   Musculoskeletal: Positive for back pain, joint swelling and gait problem.  Skin: Positive for color change. Negative for pallor and rash.  Neurological: Negative for weakness and numbness.  Hematological: Negative.   Psychiatric/Behavioral: Negative.    Allergies  Review of patient's allergies indicates no known allergies.  Home Medications   Prior to Admission medications   Medication Sig Start Date End Date Taking? Authorizing Provider  Ascorbic Acid (VITAMIN C) 1000 MG tablet Take 1,000 mg by mouth daily.     Historical Provider, MD  aspirin EC 81 MG tablet Take 81 mg by mouth daily.    Historical Provider, MD  atenolol (TENORMIN) 25 MG tablet Take 1 tablet (25 mg total) by mouth daily. 06/22/14   Leonie Man, MD  atorvastatin (LIPITOR) 20 MG tablet Take 20 mg by mouth daily. 08/11/13   Historical Provider, MD  Cholecalciferol (VITAMIN D) 2000 UNITS CAPS Take 1 capsule by mouth.    Historical Provider, MD  dexamethasone (DECADRON) 4 MG tablet Take 5 tablets (20 mg total) by mouth once a week. 09/29/14   Heath Lark, MD  diltiazem (DILACOR XR) 180 MG 24 hr capsule Take 180 mg by mouth daily.    Historical Provider, MD  feeding supplement, ENSURE ENLIVE, (ENSURE ENLIVE) LIQD Take 237 mLs by mouth 2 (two) times daily between meals. 08/14/14   Florencia Reasons, MD  furosemide (LASIX) 20 MG tablet Take 20 mg by mouth daily. 05/05/14   Historical Provider, MD  gabapentin (NEURONTIN) 100 MG capsule Take 100 mg by mouth daily. 11/23/13   Historical Provider, MD  HYDROcodone-acetaminophen (NORCO/VICODIN) 5-325 MG per tablet Take 1 tablet by mouth 2 (two) times daily as needed. Take 1 tab bid or tid prn 07/28/14   Historical Provider, MD  latanoprost (XALATAN) 0.005 % ophthalmic solution Place 1 drop into both eyes at bedtime.  02/17/14   Historical Provider, MD  lenalidomide (REVLIMID) 5 MG capsule Take 1 capsule daily for 21 days, then off for 7 days 09/29/14   Heath Lark, MD  levETIRAcetam (KEPPRA) 500 MG tablet Take 1 tablet (500 mg total) by mouth every 12 (twelve) hours. 05/05/14   Marcial Pacas, MD  Multiple Vitamins-Minerals (MENS MULTIVITAMIN PLUS PO) Take 1 tablet by mouth.    Historical Provider, MD  MYRBETRIQ 50 MG TB24 tablet Take 50 mg by mouth daily. 07/06/14   Historical Provider, MD  VASCEPA 1 G CAPS Take 1 g by mouth. 07/29/14   Historical Provider, MD  warfarin (COUMADIN) 5 MG tablet Take 1 tablet (5 mg total) by mouth daily. 08/16/14   Florencia Reasons, MD   BP 178/80 mmHg  Pulse 65  Temp(Src) 97.8 F (36.6 C) (Oral)  Resp  16  SpO2 97% Physical Exam  Constitutional: He is oriented to person, place, and time. He appears well-developed and well-nourished. No distress.  HENT:  Head: Normocephalic and atraumatic.  Right Ear: External ear normal.  Left Ear: External ear normal.  Nose: Nose normal.  Mouth/Throat: Oropharynx is clear and moist. No oropharyngeal exudate.  Eyes: Conjunctivae and EOM are normal. Pupils are equal, round, and reactive to light. Right eye exhibits no discharge. Left eye exhibits no discharge. No scleral icterus.  Neck: Normal range of motion. Neck supple. No JVD present. No tracheal deviation present.  Cardiovascular: Normal rate and regular rhythm.   Pulmonary/Chest: Effort normal and breath sounds normal. No stridor. No respiratory distress.  Musculoskeletal: He exhibits edema and tenderness.       Right knee: Normal. He exhibits normal range of motion, no swelling, no effusion and no ecchymosis.       Right ankle: He exhibits swelling and ecchymosis. He exhibits normal range of motion, no deformity and normal pulse. Achilles tendon normal. Achilles tendon exhibits no pain and no defect.  Tenderness to medial malleolus with edema and ecchymosis  Lymphadenopathy:    He has no cervical adenopathy.  Neurological: He is alert and oriented to person, place, and time. He exhibits normal muscle tone. Coordination normal.  Skin: Skin is warm and dry. No rash noted. He is not diaphoretic. No erythema. No pallor.  Psychiatric: He has a normal mood and affect. His behavior is normal. Judgment and thought content normal.    ED Course  Procedures (including critical care time) Labs Review Labs Reviewed - No data to display  Imaging Review No results found. I have personally reviewed and evaluated these images and lab results as part of my medical decision-making.   EKG Interpretation None      MDM   Final diagnoses:  Right ankle sprain, initial encounter    Ankle sprain Applied  ankle brace, pt has pain meds available at home, will follow up with ortho - pt is already established with [facility].    Delsa Grana, PA-C 11/21/14 3845  Virgel Manifold, MD 11/30/14 [id number]

## 2014-11-17 NOTE — ED Notes (Signed)
C/o right lower leg pain x 3-4 days, had a CT scan this morning at [facility], (for enlarged prostate per son)- right ankle bruised and swollen. Positive pedal pulse

## 2014-11-17 NOTE — Discharge Instructions (Signed)

## 2014-11-19 ENCOUNTER — Ambulatory Visit: Payer: Commercial Managed Care - HMO | Admitting: Hematology and Oncology

## 2014-11-19 ENCOUNTER — Other Ambulatory Visit: Payer: Commercial Managed Care - HMO

## 2014-12-01 ENCOUNTER — Other Ambulatory Visit (HOSPITAL_BASED_OUTPATIENT_CLINIC_OR_DEPARTMENT_OTHER): Payer: Commercial Managed Care - HMO

## 2014-12-01 ENCOUNTER — Telehealth: Payer: Self-pay | Admitting: Hematology and Oncology

## 2014-12-01 ENCOUNTER — Encounter: Payer: Self-pay | Admitting: Hematology and Oncology

## 2014-12-01 ENCOUNTER — Ambulatory Visit (HOSPITAL_BASED_OUTPATIENT_CLINIC_OR_DEPARTMENT_OTHER): Payer: Commercial Managed Care - HMO | Admitting: Hematology and Oncology

## 2014-12-01 VITALS — BP 155/63 | HR 75 | Temp 98.2°F | Resp 18 | Ht 65.0 in | Wt 224.0 lb

## 2014-12-01 DIAGNOSIS — D696 Thrombocytopenia, unspecified: Secondary | ICD-10-CM | POA: Diagnosis not present

## 2014-12-01 DIAGNOSIS — D469 Myelodysplastic syndrome, unspecified: Secondary | ICD-10-CM

## 2014-12-01 DIAGNOSIS — Z23 Encounter for immunization: Secondary | ICD-10-CM | POA: Diagnosis not present

## 2014-12-01 DIAGNOSIS — C9000 Multiple myeloma not having achieved remission: Secondary | ICD-10-CM

## 2014-12-01 LAB — CBC WITH DIFFERENTIAL/PLATELET
BASO%: 0 % (ref 0.0–2.0)
Basophils Absolute: 0 10*3/uL (ref 0.0–0.1)
EOS%: 0.2 % (ref 0.0–7.0)
Eosinophils Absolute: 0 10*3/uL (ref 0.0–0.5)
HEMATOCRIT: 35.4 % — AB (ref 38.4–49.9)
HGB: 10.9 g/dL — ABNORMAL LOW (ref 13.0–17.1)
LYMPH%: 38.2 % (ref 14.0–49.0)
MCH: 25.1 pg — AB (ref 27.2–33.4)
MCHC: 30.8 g/dL — AB (ref 32.0–36.0)
MCV: 81.4 fL (ref 79.3–98.0)
MONO#: 1.5 10*3/uL — AB (ref 0.1–0.9)
MONO%: 35.1 % — ABNORMAL HIGH (ref 0.0–14.0)
NEUT%: 26.5 % — ABNORMAL LOW (ref 39.0–75.0)
NEUTROS ABS: 1.1 10*3/uL — AB (ref 1.5–6.5)
Platelets: 104 10*3/uL — ABNORMAL LOW (ref 140–400)
RBC: 4.35 10*6/uL (ref 4.20–5.82)
RDW: 22.7 % — ABNORMAL HIGH (ref 11.0–14.6)
WBC: 4.2 10*3/uL (ref 4.0–10.3)
lymph#: 1.6 10*3/uL (ref 0.9–3.3)
nRBC: 0 % (ref 0–0)

## 2014-12-01 LAB — COMPREHENSIVE METABOLIC PANEL (CC13)
ALT: 18 U/L (ref 0–55)
AST: 19 U/L (ref 5–34)
Albumin: 2.7 g/dL — ABNORMAL LOW (ref 3.5–5.0)
Alkaline Phosphatase: 59 U/L (ref 40–150)
Anion Gap: 6 mEq/L (ref 3–11)
BUN: 9.2 mg/dL (ref 7.0–26.0)
CALCIUM: 8.4 mg/dL (ref 8.4–10.4)
CHLORIDE: 109 meq/L (ref 98–109)
CO2: 25 meq/L (ref 22–29)
CREATININE: 1.1 mg/dL (ref 0.7–1.3)
EGFR: 73 mL/min/{1.73_m2} — ABNORMAL LOW (ref >90)
GLUCOSE: 149 mg/dL — AB (ref 70–140)
POTASSIUM: 3.3 meq/L — AB (ref 3.5–5.1)
SODIUM: 141 meq/L (ref 136–145)
Total Bilirubin: 0.34 mg/dL (ref 0.20–1.20)
Total Protein: 8.2 g/dL (ref 6.4–8.3)

## 2014-12-01 MED ORDER — INFLUENZA VAC SPLIT QUAD 0.5 ML IM SUSY
0.5000 mL | PREFILLED_SYRINGE | Freq: Once | INTRAMUSCULAR | Status: AC
  Administered 2014-12-01: 0.5 mL via INTRAMUSCULAR
  Filled 2014-12-01: qty 0.5

## 2014-12-01 NOTE — Assessment & Plan Note (Signed)
He has chronic trombocytopenia related to disease. His platelet count has continued to improve. As long as his blood count is above 50,000, there is no contraindication for him to remain on chronic anticoagulation therapy.

## 2014-12-01 NOTE — Telephone Encounter (Signed)
per pof to sch pt appt-gave pt copy opf avs

## 2014-12-01 NOTE — Assessment & Plan Note (Signed)
He tolerated treatment well and has improvement of anemia. He remained persistently pancytopenic with mild neutropenia and thrombocytopenia but is not symptomatic with infection. We will continue low-dose treatment until his next visit a month from now.

## 2014-12-01 NOTE — Progress Notes (Signed)
[place] [facility] OFFICE PROGRESS NOTE  Patient Care Team: [name], MD as PCP - General (Internal Medicine) Heath Lark, MD as Consulting Physician (Hematology and Oncology)  SUMMARY OF ONCOLOGIC HISTORY:   Myelodysplastic syndrome   08/05/2011 Initial Diagnosis Myelodysplastic syndrome   10/15/2014 -  Chemotherapy He started taking Revlimid weekly along with dexamethasone weekly    INTERVAL HISTORY: Please see below for problem oriented charting. He returns for further follow-up. He feels well apart from recent accidental right ankle sprain. Denies recent infection. His sons reported great appetite. The patient denies any recent signs or symptoms of bleeding such as spontaneous epistaxis, hematuria or hematochezia.   REVIEW OF SYSTEMS:   Constitutional: Denies fevers, chills or abnormal weight loss Eyes: Denies blurriness of vision Ears, nose, mouth, throat, and face: Denies mucositis or sore throat Respiratory: Denies cough, dyspnea or wheezes Cardiovascular: Denies palpitation, chest discomfort or lower extremity swelling Gastrointestinal:  Denies nausea, heartburn or change in bowel habits Skin: Denies abnormal skin rashes Lymphatics: Denies new lymphadenopathy or easy bruising Neurological:Denies numbness, tingling or new weaknesses Behavioral/Psych: Mood is stable, no new changes  All other systems were reviewed with the patient and are negative.  I have reviewed the past medical history, past surgical history, social history and family history with the patient and they are unchanged from previous note.  ALLERGIES:  has No Known Allergies.  MEDICATIONS:  Current Outpatient Prescriptions  Medication Sig Dispense Refill  . Ascorbic Acid (VITAMIN C) 1000 MG tablet Take 1,000 mg by mouth daily.    Marland Kitchen aspirin EC 81 MG tablet Take 81 mg by mouth daily.    Marland Kitchen atenolol (TENORMIN) 25 MG tablet Take 1 tablet (25 mg total) by mouth daily. 30 tablet 3  . atorvastatin  (LIPITOR) 20 MG tablet Take 20 mg by mouth daily.    . Cholecalciferol (VITAMIN D) 2000 UNITS CAPS Take 1 capsule by mouth.    . dexamethasone (DECADRON) 4 MG tablet Take 5 tablets (20 mg total) by mouth once a week. 60 tablet 0  . diltiazem (DILACOR XR) 180 MG 24 hr capsule Take 180 mg by mouth daily.    . feeding supplement, ENSURE ENLIVE, (ENSURE ENLIVE) LIQD Take 237 mLs by mouth 2 (two) times daily between meals. 237 mL 12  . furosemide (LASIX) 20 MG tablet Take 20 mg by mouth daily.    Marland Kitchen gabapentin (NEURONTIN) 100 MG capsule Take 100 mg by mouth daily.    Marland Kitchen HYDROcodone-acetaminophen (NORCO/VICODIN) 5-325 MG per tablet Take 1 tablet by mouth 2 (two) times daily as needed. Take 1 tab bid or tid prn  0  . latanoprost (XALATAN) 0.005 % ophthalmic solution Place 1 drop into both eyes at bedtime.   4  . lenalidomide (REVLIMID) 5 MG capsule Take 1 capsule daily for 21 days, then off for 7 days 21 capsule 0  . levETIRAcetam (KEPPRA) 500 MG tablet Take 1 tablet (500 mg total) by mouth every 12 (twelve) hours. 180 tablet 2  . Multiple Vitamins-Minerals (MENS MULTIVITAMIN PLUS PO) Take 1 tablet by mouth.    Marland Kitchen MYRBETRIQ 50 MG TB24 tablet Take 50 mg by mouth daily.  3  . VASCEPA 1 G CAPS Take 1 g by mouth.  2  . warfarin (COUMADIN) 5 MG tablet Take 1 tablet (5 mg total) by mouth daily. 135 tablet 1   No current facility-administered medications for this visit.    PHYSICAL EXAMINATION: ECOG PERFORMANCE STATUS: 2 - Symptomatic, <50% confined to bed  Filed Vitals:   12/01/14 1024  BP: 155/63  Pulse: 75  Temp: 98.2 F (36.8 C)  Resp: 18   Filed Weights   12/01/14 1024  Weight: 224 lb (101.606 kg)    GENERAL:alert, no distress and comfortable SKIN: skin color, texture, turgor are normal, no rashes or significant lesions. Noted some bruising around the right ankle EYES: normal, Conjunctiva are pink and non-injected, sclera clear HEART: mild bilateral extremity edema NEURO: alert & oriented  x 3 with fluent speech, no focal motor/sensory deficits  LABORATORY DATA:  I have reviewed the data as listed    Component Value Date/Time   NA 141 12/01/2014 1006   NA 135 09/21/2014 1127   K 3.3* 12/01/2014 1006   K 3.7 09/21/2014 1127   CL 105 09/21/2014 1127   CL 109* 01/02/2012 1526   CO2 25 12/01/2014 1006   CO2 25 09/21/2014 1127   GLUCOSE 149* 12/01/2014 1006   GLUCOSE 77 09/21/2014 1127   GLUCOSE 118* 01/02/2012 1526   BUN 9.2 12/01/2014 1006   BUN 10 09/21/2014 1127   CREATININE 1.1 12/01/2014 1006   CREATININE 0.89 09/21/2014 1127   CREATININE 0.98 08/14/2014 0735   CALCIUM 8.4 12/01/2014 1006   CALCIUM 8.3* 09/21/2014 1127   PROT 8.2 12/01/2014 1006   PROT 11.0* 09/21/2014 1127   ALBUMIN 2.7* 12/01/2014 1006   ALBUMIN 2.8* 09/21/2014 1127   AST 19 12/01/2014 1006   AST 20 09/21/2014 1127   ALT 18 12/01/2014 1006   ALT 9 09/21/2014 1127   ALKPHOS 59 12/01/2014 1006   ALKPHOS 46 09/21/2014 1127   BILITOT 0.34 12/01/2014 1006   BILITOT 0.3 09/21/2014 1127   GFRNONAA >60 08/14/2014 0735   GFRAA >60 08/14/2014 0735    No results found for: SPEP, UPEP  Lab Results  Component Value Date   WBC 4.2 12/01/2014   NEUTROABS 1.1* 12/01/2014   HGB 10.9* 12/01/2014   HCT 35.4* 12/01/2014   MCV 81.4 12/01/2014   PLT 104* 12/01/2014      Chemistry      Component Value Date/Time   NA 141 12/01/2014 1006   NA 135 09/21/2014 1127   K 3.3* 12/01/2014 1006   K 3.7 09/21/2014 1127   CL 105 09/21/2014 1127   CL 109* 01/02/2012 1526   CO2 25 12/01/2014 1006   CO2 25 09/21/2014 1127   BUN 9.2 12/01/2014 1006   BUN 10 09/21/2014 1127   CREATININE 1.1 12/01/2014 1006   CREATININE 0.89 09/21/2014 1127   CREATININE 0.98 08/14/2014 0735      Component Value Date/Time   CALCIUM 8.4 12/01/2014 1006   CALCIUM 8.3* 09/21/2014 1127   ALKPHOS 59 12/01/2014 1006   ALKPHOS 46 09/21/2014 1127   AST 19 12/01/2014 1006   AST 20 09/21/2014 1127   ALT 18 12/01/2014 1006    ALT 9 09/21/2014 1127   BILITOT 0.34 12/01/2014 1006   BILITOT 0.3 09/21/2014 1127     ASSESSMENT & PLAN:  Multiple myeloma He is doing well with virtually no side effects from treatment. I will switch dexamethasone to 4 mg once a week by mouth on Wednesdays. I plan to add Zometa in the future. I discussed with the patient and his son state importance of getting dental clearance before we proceed Since he is tolerating treatment better, I will now increase the frequency of Revlimid to 5 mg 3 times a week. I will reassess in one month. If he continues to do well, I  will continue to increase the dose/frequency of Revlimid to conventional dosing, which is 21 days on, 7 days off.   Myelodysplastic syndrome He tolerated treatment well and has improvement of anemia. He remained persistently pancytopenic with mild neutropenia and thrombocytopenia but is not symptomatic with infection. We will continue low-dose treatment until his next visit a month from now.  Thrombocytopenia, chronic He has chronic trombocytopenia related to disease. His platelet count has continued to improve. As long as his blood count is above 50,000, there is no contraindication for him to remain on chronic anticoagulation therapy.   No orders of the defined types were placed in this encounter.   All questions were answered. The patient knows to call the clinic with any problems, questions or concerns. No barriers to learning was detected. I spent 15 minutes counseling the patient face to face. The total time spent in the appointment was 20 minutes and more than 50% was on counseling and review of test results     GORSUCH, NI, MD 12/01/2014 11:04 AM    

## 2014-12-01 NOTE — Assessment & Plan Note (Signed)
He is doing well with virtually no side effects from treatment. I will switch dexamethasone to 4 mg once a week by mouth on Wednesdays. I plan to add Zometa in the future. I discussed with the patient and his son state importance of getting dental clearance before we proceed Since he is tolerating treatment better, I will now increase the frequency of Revlimid to 5 mg 3 times a week. I will reassess in one month. If he continues to do well, I will continue to increase the dose/frequency of Revlimid to conventional dosing, which is 21 days on, 7 days off.

## 2014-12-02 ENCOUNTER — Ambulatory Visit (INDEPENDENT_AMBULATORY_CARE_PROVIDER_SITE_OTHER): Payer: Commercial Managed Care - HMO | Admitting: Pharmacist Clinician (PhC)/ Clinical Pharmacy Specialist

## 2014-12-02 DIAGNOSIS — I639 Cerebral infarction, unspecified: Secondary | ICD-10-CM

## 2014-12-02 DIAGNOSIS — Z7901 Long term (current) use of anticoagulants: Secondary | ICD-10-CM

## 2014-12-02 DIAGNOSIS — I4891 Unspecified atrial fibrillation: Secondary | ICD-10-CM

## 2014-12-02 LAB — POCT INR: INR: 3

## 2014-12-09 ENCOUNTER — Ambulatory Visit (INDEPENDENT_AMBULATORY_CARE_PROVIDER_SITE_OTHER): Payer: Commercial Managed Care - HMO | Admitting: Podiatry

## 2014-12-09 ENCOUNTER — Encounter: Payer: Self-pay | Admitting: Podiatry

## 2014-12-09 ENCOUNTER — Other Ambulatory Visit: Payer: Self-pay | Admitting: *Deleted

## 2014-12-09 DIAGNOSIS — C9000 Multiple myeloma not having achieved remission: Secondary | ICD-10-CM

## 2014-12-09 DIAGNOSIS — M79676 Pain in unspecified toe(s): Secondary | ICD-10-CM | POA: Diagnosis not present

## 2014-12-09 DIAGNOSIS — B351 Tinea unguium: Secondary | ICD-10-CM | POA: Diagnosis not present

## 2014-12-09 DIAGNOSIS — D469 Myelodysplastic syndrome, unspecified: Secondary | ICD-10-CM

## 2014-12-09 MED ORDER — LENALIDOMIDE 5 MG PO CAPS: ORAL_CAPSULE | ORAL | Status: DC

## 2014-12-09 NOTE — Patient Instructions (Signed)
Removed Band-Aid on the end of third left toe 1-2 days and apply topical antibiotic ointment daily until a scab forms Use all-purpose skin lotion on your legs and feet daily such as a Equate Vaseline intensive care non-scented  Diabetes and Foot Care Diabetes may cause you to have problems because of poor blood supply (circulation) to your feet and legs. This may cause the skin on your feet to become thinner, break easier, and heal more slowly. Your skin may become dry, and the skin may peel and crack. You may also have nerve damage in your legs and feet causing decreased feeling in them. You may not notice minor injuries to your feet that could lead to infections or more serious problems. Taking care of your feet is one of the most important things you can do for yourself.  HOME CARE INSTRUCTIONS  Wear shoes at all times, even in the house. Do not go barefoot. Bare feet are easily injured.  Check your feet daily for blisters, cuts, and redness. If you cannot see the bottom of your feet, use a mirror or ask someone for help.  Wash your feet with warm water (do not use hot water) and mild soap. Then pat your feet and the areas between your toes until they are completely dry. Do not soak your feet as this can dry your skin.  Apply a moisturizing lotion or petroleum jelly (that does not contain alcohol and is unscented) to the skin on your feet and to dry, brittle toenails. Do not apply lotion between your toes.  Trim your toenails straight across. Do not dig under them or around the cuticle. File the edges of your nails with an emery board or nail file.  Do not cut corns or calluses or try to remove them with medicine.  Wear clean socks or stockings every day. Make sure they are not too tight. Do not wear knee-high stockings since they may decrease blood flow to your legs.  Wear shoes that fit properly and have enough cushioning. To break in new shoes, wear them for just a few hours a day. This  prevents you from injuring your feet. Always look in your shoes before you put them on to be sure there are no objects inside.  Do not cross your legs. This may decrease the blood flow to your feet.  If you find a minor scrape, cut, or break in the skin on your feet, keep it and the skin around it clean and dry. These areas may be cleansed with mild soap and water. Do not cleanse the area with peroxide, alcohol, or iodine.  When you remove an adhesive bandage, be sure not to damage the skin around it.  If you have a wound, look at it several times a day to make sure it is healing.  Do not use heating pads or hot water bottles. They may burn your skin. If you have lost feeling in your feet or legs, you may not know it is happening until it is too late.  Make sure your health care provider performs a complete foot exam at least annually or more often if you have foot problems. Report any cuts, sores, or bruises to your health care provider immediately. SEEK MEDICAL CARE IF:   You have an injury that is not healing.  You have cuts or breaks in the skin.  You have an ingrown nail.  You notice redness on your legs or feet.  You feel burning or tingling  in your legs or feet.  You have pain or cramps in your legs and feet.  Your legs or feet are numb.  Your feet always feel cold. SEEK IMMEDIATE MEDICAL CARE IF:   There is increasing redness, swelling, or pain in or around a wound.  There is a red line that goes up your leg.  Pus is coming from a wound.  You develop a fever or as directed by your health care provider.  You notice a bad smell coming from an ulcer or wound.   This information is not intended to replace advice given to you by your health care provider. Make sure you discuss any questions you have with your health care provider.   Document Released: 02/18/2000 Document Revised: 10/23/2012 Document Reviewed: 07/30/2012 Elsevier Interactive Patient Education [employer]

## 2014-12-09 NOTE — Progress Notes (Signed)
Patient ID: Terry Cooley., male   DOB: May 03, 1928, 81 y.o.   MRN: [id number]  Subjective: This patient presents for scheduled visit complaining of painful toenails walking wearing shoes and requests toenail debridement  Objective: The toenails are discolored, incurvated, brittle, elongated and tender directly palpation 6-10  Assessment: Symptomatic onychomycoses 6-10  Plan: Debrided toenails 10 mechanically and electronically. Small bleeding distal third left toe treated with antibiotic ointment and Band-Aid. Patient advised to continue applying topical antibiotic ointment Band-Aid to this area of removal 1-2 days until a scab forms  Reappoint 3 months:

## 2014-12-18 ENCOUNTER — Ambulatory Visit (INDEPENDENT_AMBULATORY_CARE_PROVIDER_SITE_OTHER)
Admission: RE | Admit: 2014-12-18 | Discharge: 2014-12-18 | Disposition: A | Discharge status: Home / Self Care | Payer: Commercial Managed Care - HMO | Source: Ambulatory Visit | Attending: Pulmonary Disease | Admitting: Pulmonary Disease

## 2014-12-18 ENCOUNTER — Ambulatory Visit (INDEPENDENT_AMBULATORY_CARE_PROVIDER_SITE_OTHER): Payer: Commercial Managed Care - HMO | Admitting: Pulmonary Disease

## 2014-12-18 ENCOUNTER — Encounter: Payer: Self-pay | Admitting: Pulmonary Disease

## 2014-12-18 VITALS — BP 144/78 | HR 59 | Temp 97.0°F | Ht 65.0 in | Wt 218.0 lb

## 2014-12-18 DIAGNOSIS — J942 Hemothorax: Secondary | ICD-10-CM | POA: Diagnosis not present

## 2014-12-18 NOTE — Assessment & Plan Note (Addendum)
Mr. Morimoto has been followed in our clinic since April 2015 when he was hospitalized with pneumonia, hemothorax, and abnormal pleural thickening felt to be related to as best as exposure. Since then he has had no respiratory complaints and his chest imaging has been stable.  Today is not exception, his chest x-ray looks fine.  Plan: We will plan on seeing him one more time in April 2017 with a repeat chest x-ray, assuming his chest x-ray will be normal then I doubt he'll need to see [place] any more because that will be two total years of stability.

## 2014-12-18 NOTE — Patient Instructions (Signed)
We will order a chest x-ray for 6 months from now and see you after that Let us know if you have any problems with her breathing in the meantime

## 2014-12-18 NOTE — Progress Notes (Signed)
Subjective:    Patient ID: Terry Robertson., male    DOB: 1928-06-09, 81 y.o.   MRN: [id number]  Synopsis: [name] had a hemothorax in 06/2013 for CAP and a hemothorax while on warfarin.  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  HPI Chief Complaint  Patient presents with  . Follow-up    follow up - reports breathing is doing well, no new complaints.   [name] has been doing well without complaint. He denies shortness of breath, weight loss, chest pain, or hemoptysis. He is here today to follow-up on his abnormal pleural thickening which was seen in April 2015.  Past Medical History  Diagnosis Date  . Diabetes mellitus   . Essential hypertension   . Stroke ([facility] [facility]) 08/2011, 03/2012    L MCA (Afib RVR while admitted for hypotension) - expressive aphasia (almost totally recovered)  . Seizures ([place])     Post CVA; on Keppra; stable  . OSA on CPAP   . Paroxysmal atrial fibrillation ([facility])     on coumadin; Diagnosed at the time of his 1st CVA  . Obesity, morbid ([place])     BMI  . ST-segment elevation myocardial infarction (STEMI) of inferior wall ([place]) 1997    inferior MI with left circumflex stent  . CAD S/P percutaneous coronary angioplasty 1997    status post remote PCI to the LAD and circumflex in 1997, heart cath in 2008 showed widely patent stents in the circumflex and LAD.  RCA had a mid lesion and then was totally occluded distally.  . Myelodysplastic syndrome ([place])     w/mild anemia an neutropenia  . BPH (benign prostatic hyperplasia)     TURP in 12/2010  . CHF (congestive heart failure) ([place])   . Asbestosis [facility])     family unclear where he was evaluated in the past  . Aortic stenosis, mild 08/10/2014    : EF 60-65%, mod Conc LVH, cannot assess Diastolic Fxn.  Mild AS. Mild MVP without MR., Mod LA/RA dilation.  Marland Kitchen MGUS (monoclonal gammopathy of unknown significance) 09/21/2014  . Multiple myeloma ([place]) 09/29/2014     Review of  Systems     Objective:   Physical Exam Filed Vitals:   12/18/14 1154  BP: 144/78  Pulse: 59  Temp: 97 F (36.1 C)  TempSrc: Oral  Height: _0  (1.651 m)  Weight: 218 lb (98.884 kg)  SpO2: 97%  RA  Gen: well appearing, no acute distress HEENT: NCAT,EOMi, OP clear PULM: diminished R base, otherwise clear CV: RRR, no mgr, no JVD AB: BS+, soft, nontender,  Ext: warm, no edema, no clubbing, no cyanosis Derm: no rash or skin breakdown Neuro: A&Ox4, MAEW   October 2016 chest x-ray images personally reviewed showing improved pleural effusion and pleural thickening changes in the left base, cardiomegaly noted     Assessment & Plan:   No problem-specific assessment & plan notes found for this encounter.   Updated Medication List Outpatient Encounter Prescriptions as of 12/18/2014  Medication Sig  . Ascorbic Acid (VITAMIN C) 1000 MG tablet Take 1,000 mg by mouth daily.  Marland Kitchen aspirin EC 81 MG tablet Take 81 mg by mouth daily.  Marland Kitchen atenolol (TENORMIN) 25 MG tablet Take 1 tablet (25 mg total) by mouth daily.  Marland Kitchen atorvastatin (LIPITOR) 20 MG tablet Take 20 mg by mouth daily.  . Cholecalciferol (VITAMIN D) 2000 UNITS CAPS Take 1 capsule by mouth.  . dexamethasone (DECADRON)  4 MG tablet Take 5 tablets (20 mg total) by mouth once a week.  . diltiazem (DILACOR XR) 180 MG 24 hr capsule Take 180 mg by mouth daily.  . feeding supplement, ENSURE ENLIVE, (ENSURE ENLIVE) LIQD Take 237 mLs by mouth 2 (two) times daily between meals.  . furosemide (LASIX) 20 MG tablet Take 20 mg by mouth daily.  Marland Kitchen gabapentin (NEURONTIN) 100 MG capsule Take 100 mg by mouth daily.  Marland Kitchen HYDROcodone-acetaminophen (NORCO/VICODIN) 5-325 MG per tablet Take 1 tablet by mouth 2 (two) times daily as needed. Take 1 tab bid or tid prn  . latanoprost (XALATAN) 0.005 % ophthalmic solution Place 1 drop into both eyes at bedtime.   Marland Kitchen lenalidomide (REVLIMID) 5 MG capsule Take 1 capsule daily for 21 days, then off for 7 days  .  levETIRAcetam (KEPPRA) 500 MG tablet Take 1 tablet (500 mg total) by mouth every 12 (twelve) hours.  . Multiple Vitamins-Minerals (MENS MULTIVITAMIN PLUS PO) Take 1 tablet by mouth.  Marland Kitchen MYRBETRIQ 50 MG TB24 tablet Take 50 mg by mouth daily.  Marland Kitchen VASCEPA 1 G CAPS Take 1 g by mouth.  . warfarin (COUMADIN) 5 MG tablet Take 1 tablet (5 mg total) by mouth daily.   No facility-administered encounter medications on file as of 12/18/2014.

## 2014-12-29 ENCOUNTER — Other Ambulatory Visit (HOSPITAL_BASED_OUTPATIENT_CLINIC_OR_DEPARTMENT_OTHER): Payer: Commercial Managed Care - HMO

## 2014-12-29 ENCOUNTER — Encounter: Payer: Self-pay | Admitting: Hematology and Oncology

## 2014-12-29 ENCOUNTER — Ambulatory Visit (HOSPITAL_BASED_OUTPATIENT_CLINIC_OR_DEPARTMENT_OTHER): Payer: Commercial Managed Care - HMO | Admitting: Hematology and Oncology

## 2014-12-29 ENCOUNTER — Telehealth: Payer: Self-pay | Admitting: Hematology and Oncology

## 2014-12-29 VITALS — BP 171/78 | HR 70 | Temp 97.7°F | Resp 16 | Ht 65.0 in | Wt 234.3 lb

## 2014-12-29 DIAGNOSIS — D61818 Other pancytopenia: Secondary | ICD-10-CM | POA: Diagnosis not present

## 2014-12-29 DIAGNOSIS — C9000 Multiple myeloma not having achieved remission: Secondary | ICD-10-CM | POA: Diagnosis not present

## 2014-12-29 DIAGNOSIS — D469 Myelodysplastic syndrome, unspecified: Secondary | ICD-10-CM | POA: Diagnosis not present

## 2014-12-29 DIAGNOSIS — I10 Essential (primary) hypertension: Secondary | ICD-10-CM

## 2014-12-29 LAB — CBC WITH DIFFERENTIAL/PLATELET
BASO%: 0 % (ref 0.0–2.0)
Basophils Absolute: 0 10*3/uL (ref 0.0–0.1)
EOS%: 0 % (ref 0.0–7.0)
Eosinophils Absolute: 0 10*3/uL (ref 0.0–0.5)
HEMATOCRIT: 38.9 % (ref 38.4–49.9)
HEMOGLOBIN: 12.3 g/dL — AB (ref 13.0–17.1)
LYMPH#: 1.4 10*3/uL (ref 0.9–3.3)
LYMPH%: 22.3 % (ref 14.0–49.0)
MCH: 26.2 pg — ABNORMAL LOW (ref 27.2–33.4)
MCHC: 31.6 g/dL — ABNORMAL LOW (ref 32.0–36.0)
MCV: 82.8 fL (ref 79.3–98.0)
MONO#: 1.8 10*3/uL — AB (ref 0.1–0.9)
MONO%: 28.3 % — ABNORMAL HIGH (ref 0.0–14.0)
NEUT%: 49.4 % (ref 39.0–75.0)
NEUTROS ABS: 3.1 10*3/uL (ref 1.5–6.5)
NRBC: 0 % (ref 0–0)
Platelets: 117 10*3/uL — ABNORMAL LOW (ref 140–400)
RBC: 4.7 10*6/uL (ref 4.20–5.82)
RDW: 20.1 % — AB (ref 11.0–14.6)
WBC: 6.2 10*3/uL (ref 4.0–10.3)

## 2014-12-29 LAB — COMPREHENSIVE METABOLIC PANEL (CC13)
ALBUMIN: 2.9 g/dL — AB (ref 3.5–5.0)
ALT: 29 U/L (ref 0–55)
AST: 21 U/L (ref 5–34)
Alkaline Phosphatase: 57 U/L (ref 40–150)
Anion Gap: 6 mEq/L (ref 3–11)
BUN: 14.8 mg/dL (ref 7.0–26.0)
CALCIUM: 8.6 mg/dL (ref 8.4–10.4)
CHLORIDE: 106 meq/L (ref 98–109)
CO2: 27 meq/L (ref 22–29)
Creatinine: 0.8 mg/dL (ref 0.7–1.3)
EGFR: >90 mL/min/{1.73_m2} (ref >90)
Glucose: 118 mg/dl (ref 70–140)
Potassium: 3.4 mEq/L — ABNORMAL LOW (ref 3.5–5.1)
Sodium: 139 mEq/L (ref 136–145)
Total Protein: 8.3 g/dL (ref 6.4–8.3)

## 2014-12-29 NOTE — Assessment & Plan Note (Signed)
He appears to be responding well to treatment. I will continue dexamethasone taper. He has obtained dental clearance and I plan to proceed with Zometa next month. In terms of the Revlimid, his blood counts have improved and I will try to increase Revlimid to twice a week In the future, I plan to increase the dose further to conventional doses.

## 2014-12-29 NOTE — Assessment & Plan Note (Signed)
He tolerated treatment well and has improvement of anemia. We will continue low-dose treatment until his next visit a month from now.

## 2014-12-29 NOTE — Progress Notes (Signed)
[place] OFFICE PROGRESS NOTE  Patient Care Team: [name], MD as PCP - General (Internal Medicine) Heath Lark, MD as Consulting Physician (Hematology and Oncology)  SUMMARY OF ONCOLOGIC HISTORY:   Myelodysplastic syndrome ([place])   08/05/2011 Initial Diagnosis Myelodysplastic syndrome   10/15/2014 -  Chemotherapy He started taking Revlimid weekly along with dexamethasone weekly    INTERVAL HISTORY: Please see below for problem oriented charting. He is doing well and is gaining weight. Due to miscommunication, his sons were giving him dexamethasone 3 times a week instead of once a week and Revlimid only once a week. He has gained a lot of weight and feel well. His blood count has improved. He denies any side effects of treatment. He has obtained dental clearance recently from his dentist. REVIEW OF SYSTEMS:   Constitutional: Denies fevers, chills or abnormal weight loss Eyes: Denies blurriness of vision Ears, nose, mouth, throat, and face: Denies mucositis or sore throat Respiratory: Denies cough, dyspnea or wheezes Cardiovascular: Denies palpitation, chest discomfort or lower extremity swelling Gastrointestinal:  Denies nausea, heartburn or change in bowel habits Skin: Denies abnormal skin rashes Lymphatics: Denies new lymphadenopathy or easy bruising Neurological:Denies numbness, tingling or new weaknesses Behavioral/Psych: Mood is stable, no new changes  All other systems were reviewed with the patient and are negative.   I have reviewed the past medical history, past surgical history, social history and family history with the patient and they are unchanged from previous note.  ALLERGIES:  has No Known Allergies.  MEDICATIONS:  Current Outpatient Prescriptions  Medication Sig Dispense Refill  . Ascorbic Acid (VITAMIN C) 1000 MG tablet Take 1,000 mg by mouth daily.    Marland Kitchen aspirin EC 81 MG tablet Take 81 mg by mouth daily.    Marland Kitchen atenolol (TENORMIN) 25 MG  tablet Take 1 tablet (25 mg total) by mouth daily. 30 tablet 3  . atorvastatin (LIPITOR) 20 MG tablet Take 20 mg by mouth daily.    . Cholecalciferol (VITAMIN D) 2000 UNITS CAPS Take 1 capsule by mouth.    . dexamethasone (DECADRON) 4 MG tablet Take 5 tablets (20 mg total) by mouth once a week. 60 tablet 0  . diltiazem (DILACOR XR) 180 MG 24 hr capsule Take 180 mg by mouth daily.    . feeding supplement, ENSURE ENLIVE, (ENSURE ENLIVE) LIQD Take 237 mLs by mouth 2 (two) times daily between meals. 237 mL 12  . furosemide (LASIX) 20 MG tablet Take 20 mg by mouth daily.    Marland Kitchen gabapentin (NEURONTIN) 100 MG capsule Take 100 mg by mouth daily.    Marland Kitchen HYDROcodone-acetaminophen (NORCO/VICODIN) 5-325 MG per tablet Take 1 tablet by mouth 2 (two) times daily as needed. Take 1 tab bid or tid prn  0  . latanoprost (XALATAN) 0.005 % ophthalmic solution Place 1 drop into both eyes at bedtime.   4  . lenalidomide (REVLIMID) 5 MG capsule Take 1 capsule daily for 21 days, then off for 7 days 21 capsule 0  . levETIRAcetam (KEPPRA) 500 MG tablet Take 1 tablet (500 mg total) by mouth every 12 (twelve) hours. 180 tablet 2  . Multiple Vitamins-Minerals (MENS MULTIVITAMIN PLUS PO) Take 1 tablet by mouth.    Marland Kitchen MYRBETRIQ 50 MG TB24 tablet Take 50 mg by mouth daily.  3  . VASCEPA 1 G CAPS Take 1 g by mouth.  2  . warfarin (COUMADIN) 5 MG tablet Take 1 tablet (5 mg total) by mouth daily. 135 tablet 1  No current facility-administered medications for this visit.    PHYSICAL EXAMINATION: ECOG PERFORMANCE STATUS: 1 - Symptomatic but completely ambulatory  Filed Vitals:   12/29/14 1054  BP: 171/78  Pulse: 70  Temp: 97.7 F (36.5 C)  Resp: 16   Filed Weights   12/29/14 1054  Weight: 234 lb 4.8 oz (106.278 kg)    GENERAL:alert, no distress and comfortable SKIN: skin color, texture, turgor are normal, no rashes or significant lesions EYES: normal, Conjunctiva are pink and non-injected, sclera clear OROPHARYNX:no  exudate, no erythema and lips, buccal mucosa, and tongue normal  NECK: supple, thyroid normal size, non-tender, without nodularity LYMPH:  no palpable lymphadenopathy in the cervical, axillary or inguinal LUNGS: clear to auscultation and percussion with normal breathing effort HEART: regular rate & rhythm and no murmurs and no lower extremity edema ABDOMEN:abdomen soft, non-tender and normal bowel sounds Musculoskeletal:no cyanosis of digits and no clubbing  NEURO: alert & oriented x 3 with fluent speech, no focal motor/sensory deficits  LABORATORY DATA:  I have reviewed the data as listed    Component Value Date/Time   NA 139 12/29/2014 1036   NA 135 09/21/2014 1127   K 3.4* 12/29/2014 1036   K 3.7 09/21/2014 1127   CL 105 09/21/2014 1127   CL 109* 01/02/2012 1526   CO2 27 12/29/2014 1036   CO2 25 09/21/2014 1127   GLUCOSE 118 12/29/2014 1036   GLUCOSE 77 09/21/2014 1127   GLUCOSE 118* 01/02/2012 1526   BUN 14.8 12/29/2014 1036   BUN 10 09/21/2014 1127   CREATININE 0.8 12/29/2014 1036   CREATININE 0.89 09/21/2014 1127   CREATININE 0.98 08/14/2014 0735   CALCIUM 8.6 12/29/2014 1036   CALCIUM 8.3* 09/21/2014 1127   PROT 8.3 12/29/2014 1036   PROT 11.0* 09/21/2014 1127   ALBUMIN 2.9* 12/29/2014 1036   ALBUMIN 2.8* 09/21/2014 1127   AST 21 12/29/2014 1036   AST 20 09/21/2014 1127   ALT 29 12/29/2014 1036   ALT 9 09/21/2014 1127   ALKPHOS 57 12/29/2014 1036   ALKPHOS 46 09/21/2014 1127   BILITOT <0.30 12/29/2014 1036   BILITOT 0.3 09/21/2014 1127   GFRNONAA >60 08/14/2014 0735   GFRAA >60 08/14/2014 0735    No results found for: SPEP, UPEP  Lab Results  Component Value Date   WBC 6.2 12/29/2014   NEUTROABS 3.1 12/29/2014   HGB 12.3* 12/29/2014   HCT 38.9 12/29/2014   MCV 82.8 12/29/2014   PLT 117 Large platelets present* 12/29/2014      Chemistry      Component Value Date/Time   NA 139 12/29/2014 1036   NA 135 09/21/2014 1127   K 3.4* 12/29/2014 1036   K  3.7 09/21/2014 1127   CL 105 09/21/2014 1127   CL 109* 01/02/2012 1526   CO2 27 12/29/2014 1036   CO2 25 09/21/2014 1127   BUN 14.8 12/29/2014 1036   BUN 10 09/21/2014 1127   CREATININE 0.8 12/29/2014 1036   CREATININE 0.89 09/21/2014 1127   CREATININE 0.98 08/14/2014 0735      Component Value Date/Time   CALCIUM 8.6 12/29/2014 1036   CALCIUM 8.3* 09/21/2014 1127   ALKPHOS 57 12/29/2014 1036   ALKPHOS 46 09/21/2014 1127   AST 21 12/29/2014 1036   AST 20 09/21/2014 1127   ALT 29 12/29/2014 1036   ALT 9 09/21/2014 1127   BILITOT <0.30 12/29/2014 1036   BILITOT 0.3 09/21/2014 1127      ASSESSMENT & PLAN:  Multiple  myeloma He appears to be responding well to treatment. I will continue dexamethasone taper. He has obtained dental clearance and I plan to proceed with Zometa next month. In terms of the Revlimid, his blood counts have improved and I will try to increase Revlimid to twice a week In the future, I plan to increase the dose further to conventional doses.  Myelodysplastic syndrome He tolerated treatment well and has improvement of anemia. We will continue low-dose treatment until his next visit a month from now.  Essential hypertension His blood pressure is high likely due to fluid retention from dexamethasone. Hopefully, once I taper him off dexamethasone, his blood pressure will be back to normal.  Pancytopenia ([place]) This is likely due to recent treatment. The patient denies recent history of bleeding such as epistaxis, hematuria or hematochezia. He is asymptomatic from the low platelet count. I will observe for now.    No orders of the defined types were placed in this encounter.   All questions were answered. The patient knows to call the clinic with any problems, questions or concerns. No barriers to learning was detected. I spent 20 minutes counseling the patient face to face. The total time spent in the appointment was 25 minutes and more than 50% was on  counseling and review of test results     [facility], [place], MD 12/29/2014 1:23 PM

## 2014-12-29 NOTE — Assessment & Plan Note (Signed)
His blood pressure is high likely due to fluid retention from dexamethasone. Hopefully, once I taper him off dexamethasone, his blood pressure will be back to normal.

## 2014-12-29 NOTE — Assessment & Plan Note (Signed)
This is likely due to recent treatment. The patient denies recent history of bleeding such as epistaxis, hematuria or hematochezia. He is asymptomatic from the low platelet count. I will observe for now.  

## 2014-12-29 NOTE — Telephone Encounter (Signed)
per pof to sch pt appt-gave pt copy of avs °

## 2014-12-30 ENCOUNTER — Ambulatory Visit (INDEPENDENT_AMBULATORY_CARE_PROVIDER_SITE_OTHER): Payer: Commercial Managed Care - HMO | Admitting: Pharmacist Clinician (PhC)/ Clinical Pharmacy Specialist

## 2014-12-30 DIAGNOSIS — I4891 Unspecified atrial fibrillation: Secondary | ICD-10-CM | POA: Diagnosis not present

## 2014-12-30 DIAGNOSIS — I639 Cerebral infarction, unspecified: Secondary | ICD-10-CM | POA: Diagnosis not present

## 2014-12-30 DIAGNOSIS — Z7901 Long term (current) use of anticoagulants: Secondary | ICD-10-CM | POA: Diagnosis not present

## 2014-12-30 LAB — POCT INR: INR: 5

## 2015-01-08 ENCOUNTER — Other Ambulatory Visit: Payer: Self-pay | Admitting: *Deleted

## 2015-01-08 DIAGNOSIS — C9000 Multiple myeloma not having achieved remission: Secondary | ICD-10-CM

## 2015-01-08 DIAGNOSIS — D469 Myelodysplastic syndrome, unspecified: Secondary | ICD-10-CM

## 2015-01-08 MED ORDER — LENALIDOMIDE 5 MG PO CAPS: ORAL_CAPSULE | ORAL | Status: DC

## 2015-01-11 ENCOUNTER — Telehealth: Payer: Self-pay | Admitting: Internal Medicine

## 2015-01-11 NOTE — Telephone Encounter (Signed)
Patient has a cold and he has bleeding when he blows his nose - sometimes is a lot, sometimes not, sometimes thick, sometimes thin. This has been going on for 3 days. No other bleeding. Last INR was 5 on 10/26 - he is scheduled for INR check on 11/9

## 2015-01-11 NOTE — Telephone Encounter (Signed)
Mr. Gervacio has a bad cold and he is [place] and his nose is bleeding when he blows his nose . Herbie [place] wants to know should they take him off of the [place] or take him to the ER. Please call   Thanks

## 2015-01-11 NOTE — Telephone Encounter (Signed)
Spoke with son, nose is not bleeding freely, only blood on tissue when he blows his nose.  Has been happening for 3 days now.  Son thinks he might have pneumonia, but pt not eager to go to [place].  Advised son to hold warfarin dose today, and if bleeding persists even when not blowing nose, he should go to ER for evaluation.  Also advised son to let us know on Wednesday if he gets any antibiotics.  Will keep appt for INR check on Wednesday.

## 2015-01-13 ENCOUNTER — Ambulatory Visit (INDEPENDENT_AMBULATORY_CARE_PROVIDER_SITE_OTHER): Payer: Commercial Managed Care - HMO | Admitting: Pharmacist Clinician (PhC)/ Clinical Pharmacy Specialist

## 2015-01-13 DIAGNOSIS — Z7901 Long term (current) use of anticoagulants: Secondary | ICD-10-CM

## 2015-01-13 DIAGNOSIS — I639 Cerebral infarction, unspecified: Secondary | ICD-10-CM

## 2015-01-13 DIAGNOSIS — I4891 Unspecified atrial fibrillation: Secondary | ICD-10-CM | POA: Diagnosis not present

## 2015-01-13 LAB — POCT INR: INR: 4.2

## 2015-01-14 IMAGING — CR DG CHEST 2V
2 series · 2 of 2 positions shown · non-contrast
Comparison: 06/27/2013

CLINICAL DATA: Followup pneumonia.  Cough.  Former smoker

EXAM:
CHEST  2 VIEW

[view not recorded (1 of 2)]
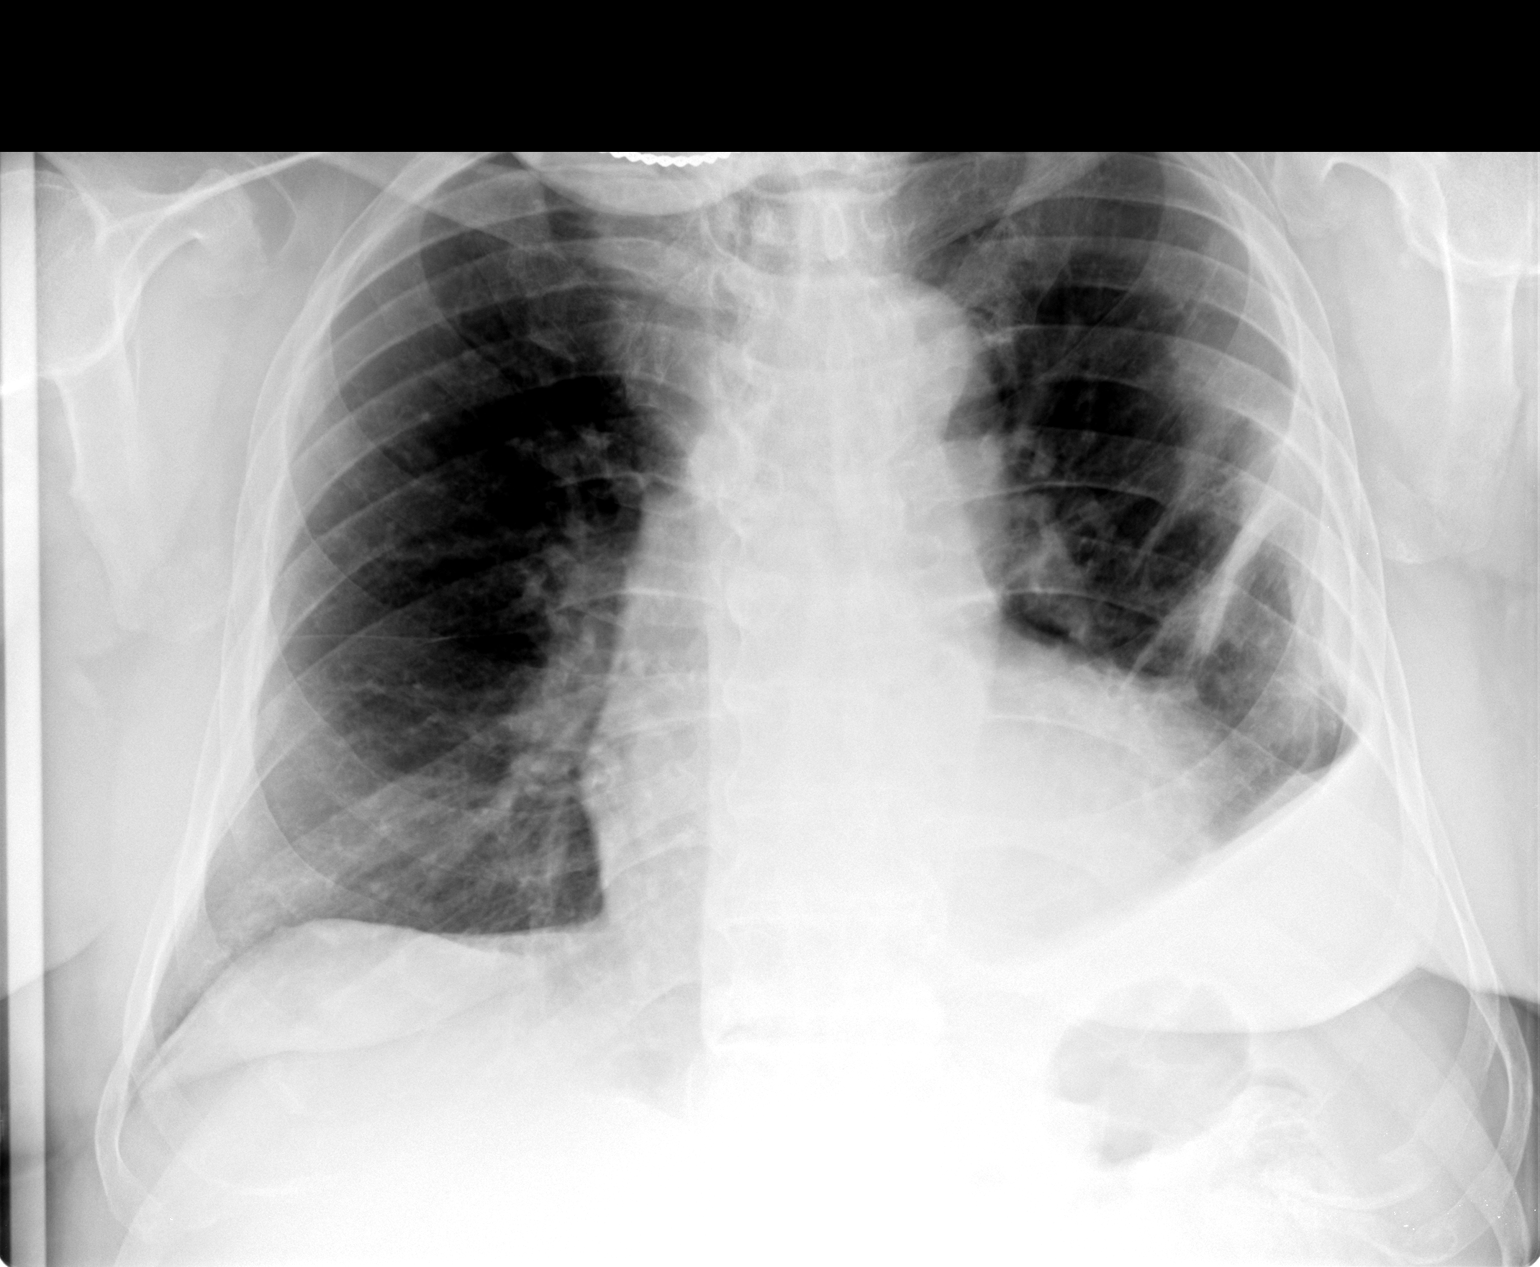

[view not recorded (2 of 2)]
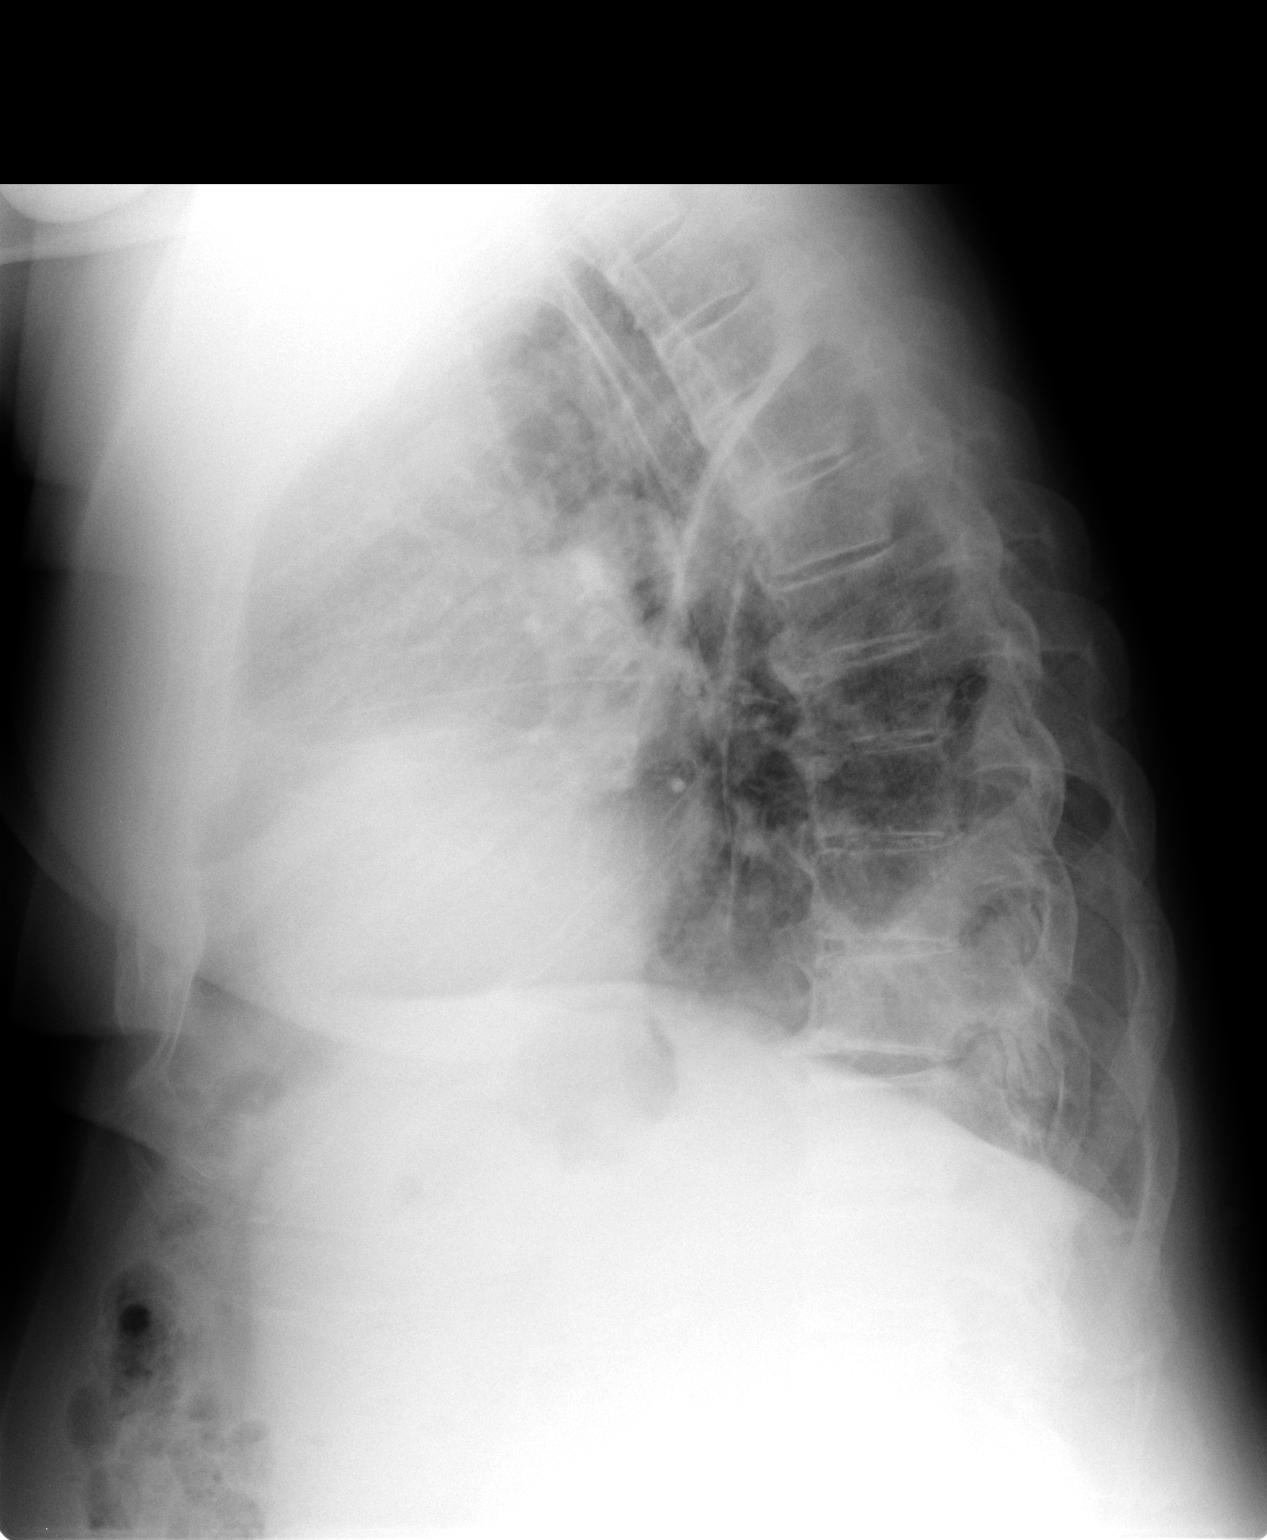

[2 of 2 positions shown; findings below may reference images not displayed]

FINDINGS: Heart size is moderately enlarged. There is persistent moderate left
pleural effusion with left mid lung and left base atelectasis. Right
lung is clear. Spondylosis identified within the thoracic spine.
IMPRESSION: 1. Persistent left effusion and left lung atelectasis.

## 2015-01-18 ENCOUNTER — Encounter: Payer: Self-pay | Admitting: Nurse Practitioner

## 2015-01-18 ENCOUNTER — Ambulatory Visit (INDEPENDENT_AMBULATORY_CARE_PROVIDER_SITE_OTHER): Payer: Commercial Managed Care - HMO | Admitting: Nurse Practitioner

## 2015-01-18 ENCOUNTER — Ambulatory Visit: Payer: Medicare HMO | Admitting: Nurse Practitioner

## 2015-01-18 VITALS — BP 169/74 | HR 74 | Ht 65.0 in | Wt 242.0 lb

## 2015-01-18 DIAGNOSIS — G40909 Epilepsy, unspecified, not intractable, without status epilepticus: Secondary | ICD-10-CM | POA: Diagnosis not present

## 2015-01-18 DIAGNOSIS — I6340 Cerebral infarction due to embolism of unspecified cerebral artery: Secondary | ICD-10-CM | POA: Diagnosis not present

## 2015-01-18 DIAGNOSIS — I480 Paroxysmal atrial fibrillation: Secondary | ICD-10-CM | POA: Diagnosis not present

## 2015-01-18 DIAGNOSIS — I10 Essential (primary) hypertension: Secondary | ICD-10-CM

## 2015-01-18 DIAGNOSIS — I639 Cerebral infarction, unspecified: Secondary | ICD-10-CM | POA: Diagnosis not present

## 2015-01-18 MED ORDER — LEVETIRACETAM 500 MG PO TABS: 500.0000 mg | ORAL_TABLET | Freq: Two times a day (BID) | ORAL | Status: AC

## 2015-01-18 NOTE — Progress Notes (Signed)
I have reviewed and agreed above plan. 

## 2015-01-18 NOTE — Patient Instructions (Addendum)
Continue Keppra 500 mg twice daily for seizure disorder will refill Call for any seizure activity Continue baby aspirin and Coumadin for secondary stroke prevention and atrial fibrillation Reviewed recent lipid panel, continue Lipitor as directed Keep blood pressure systolic less than [id number] today's reading 169/74 Follow-up yearly and when necessary

## 2015-01-18 NOTE — Progress Notes (Signed)
[facility]  PATIENT: Terry Robertson. DOB: February 23, 1929   REASON FOR VISIT: Follow-up for history of stroke and seizure disorder HISTORY FROM: Patient and 2 sons    HISTORY OF PRESENT ILLNESS:Terry Robertson is an 81 year old male with a history of hemorrhagic stroke and seizure disorder. Terry Robertson returns today for follow-up. Terry Robertson is overall doing well. No further stroke symptoms. Denies any new numbness or weakness. Denies any changes with his gait or balance. Terry Robertson ambulates with a cane. Denies recent falls. Terry Robertson does have a history of seizures. Terry Robertson is taking Keppra 500 mg BID. Terry Robertson denies any recent seizure events. Terry Robertson is on coumadin for atrial fibrillation. Patient sees his PCP regularly - Terry Robertson states that they are managing his BP and cholesterol. Patient does not exercise. Terry Robertson is currently living with 2 sons.Terry Robertson returns for reevaluation. Neurologically Terry Robertson is stable    HISTORY 02/19/13 (CM): 65 year old black male returns for followup history of hemorrhagic infarct extending from the posterior left region to the posterior left frontal parietal lobe this occurred in June of 2013. Terry Robertson has not had further stroke symptoms. Terry Robertson also has a history of seizures and is currently on Keppra 500 twice daily without recurrent seizure events. Terry Robertson does not exercise. Terry Robertson ambulates with a cane, no falls since last seen. Terry Robertson is on chronic Coumadin therapy with minimal bruising. Terry Robertson is on Coumadin for atrial fibrillation.  HISTORY: Terry Robertson has a history of stroke and seizure . Terry Robertson was admitted to the hospital 08/05/11 for 2 day history of weakness, night sweats and CP. On admission Terry Robertson was hypotensive 67/40 labs showed elevated BUN and creatinine. 2 D echo showed EF 55-65% with normal wall motion and grade 2 diastolic dysfunction. On 08/07/2011 patient converted to A fib with RVR patient was given heparin bolus followed by heparin drip and diltiazem. 08/09/11 patient was noted to be more confused with speech difficulty. CT head obtained  demonstrating 3 cm acute non hemorrhagic left parietal MCA territory infarction without hemorrhage or significant mass effect. Terry Robertson was initially place on IV heparin, this was changed to PO coumadin with asa bridge MRI confirmed acute moderate sized non hemorrhagic infarct extends from the posterior left opercular region into the posterior left frontal - parietal lobe. US carotid, No significant extracranial carotid artery stenosis demonstrated. Vertebrals are patent with antegrade flow. Terry Robertson was noted on 9/23 to be driving erratically, went home and EMS was notified and found him sitting in a chair unable to speak but awake with right sided gaze, right arm twitching. In ED upon arrival, was noted to have seizure. Further decompensated and required intubation and ICU admit. Repeat MRI brain Remote left parietal lobe infarct. On diffusion sequence without evidence of an acute infarct. EEG normal diffusely slow without any evidence of seizure Terry Robertson is taking keppra 510m bid, tolerating it well, no recurrent seizure, mild memory loss, ambulate with cane. Does not exercise. No further stroke activity    REVIEW OF SYSTEMS: Full 14 system review of systems performed and notable only for those listed, all others are neg:  Constitutional: neg  Cardiovascular: neg Ear/Nose/Throat: neg  Skin: neg Eyes: neg Respiratory: neg Gastroitestinal: neg  Hematology/Lymphatic: neg  Endocrine: neg Musculoskeletal:neg Allergy/Immunology: neg Neurological: History of stroke and seizure disorder Psychiatric: neg Sleep : neg   ALLERGIES: No Known Allergies  HOME MEDICATIONS: Outpatient Prescriptions Prior to Visit  Medication Sig Dispense Refill  . Ascorbic Acid (VITAMIN C) 1000 MG tablet Take 1,000 mg by mouth daily.    .Marland Kitchen  aspirin EC 81 MG tablet Take 81 mg by mouth daily.    Marland Kitchen atenolol (TENORMIN) 25 MG tablet Take 1 tablet (25 mg total) by mouth daily. 30 tablet 3  . atorvastatin (LIPITOR) 20 MG tablet Take 20  mg by mouth daily.    . Cholecalciferol (VITAMIN D) 2000 UNITS CAPS Take 1 capsule by mouth.    . dexamethasone (DECADRON) 4 MG tablet Take 5 tablets (20 mg total) by mouth once a week. 60 tablet 0  . diltiazem (DILACOR XR) 180 MG 24 hr capsule Take 180 mg by mouth daily.    . feeding supplement, ENSURE ENLIVE, (ENSURE ENLIVE) LIQD Take 237 mLs by mouth 2 (two) times daily between meals. 237 mL 12  . furosemide (LASIX) 20 MG tablet Take 20 mg by mouth daily.    Marland Kitchen gabapentin (NEURONTIN) 100 MG capsule Take 100 mg by mouth daily.    Marland Kitchen HYDROcodone-acetaminophen (NORCO/VICODIN) 5-325 MG per tablet Take 1 tablet by mouth 2 (two) times daily as needed. Take 1 tab bid or tid prn  0  . latanoprost (XALATAN) 0.005 % ophthalmic solution Place 1 drop into both eyes at bedtime.   4  . lenalidomide (REVLIMID) 5 MG capsule Take 1 capsule daily for 21 days, then off for 7 days 21 capsule 0  . levETIRAcetam (KEPPRA) 500 MG tablet Take 1 tablet (500 mg total) by mouth every 12 (twelve) hours. 180 tablet 2  . Multiple Vitamins-Minerals (MENS MULTIVITAMIN PLUS PO) Take 1 tablet by mouth.    Marland Kitchen MYRBETRIQ 50 MG TB24 tablet Take 50 mg by mouth daily.  3  . VASCEPA 1 G CAPS Take 1 g by mouth.  2  . warfarin (COUMADIN) 5 MG tablet Take 1 tablet (5 mg total) by mouth daily. 135 tablet 1   No facility-administered medications prior to visit.    PAST MEDICAL HISTORY: Past Medical History  Diagnosis Date  . Diabetes mellitus   . Essential hypertension   . Stroke ([place]) 08/2011, 03/2012    L MCA (Afib RVR while admitted for hypotension) - expressive aphasia (almost totally recovered)  . Seizures ([place])     Post CVA; on Keppra; stable  . OSA on CPAP   . Paroxysmal atrial fibrillation ([facility])     on coumadin; Diagnosed at the time of his 1st CVA  . Obesity, morbid ([place])     BMI  . ST-segment elevation myocardial infarction (STEMI) of inferior wall ([place]) 1997    inferior MI with left circumflex stent  . CAD S/P  percutaneous coronary angioplasty 1997    status post remote PCI to the LAD and circumflex in 1997, heart cath in 2008 showed widely patent stents in the circumflex and LAD.  RCA had a mid lesion and then was totally occluded distally.  . Myelodysplastic syndrome ([place])     w/mild anemia an neutropenia  . BPH (benign prostatic hyperplasia)     TURP in 12/2010  . CHF (congestive heart failure) ([place])   . Asbestosis [facility]     family unclear where Terry Robertson was evaluated in the past  . Aortic stenosis, mild 08/10/2014    : EF 60-65%, mod Conc LVH, cannot assess Diastolic Fxn.  Mild AS. Mild MVP without MR., Mod LA/RA dilation.  Marland Kitchen MGUS (monoclonal gammopathy of unknown significance) 09/21/2014  . Multiple myeloma ([place]) 09/29/2014    PAST SURGICAL HISTORY: Past Surgical History  Procedure Laterality Date  . Transthoracic echocardiogram  08/06/11; 08/2014    a) EF 55%  to 65% with grade2 DD/pseudonormal w/ mildly dilated LA with high LAP/LVEDP; b) EF 60-65%, mod Conc LVH, cannot assess Diastolic Fxn.  Mild AS. Mild MVP without MR., Mod LA/RA dilation.  . Cardiac catheterization  02/08/07    widely patent stent in the circumflex and LAD.  RCA had a mid lesion and then was totally occluded distally. Cook bare-metal 3.5x20 mm stent  . Transurethral resection of prostate  12/2010  . Nm myoview ltd  01/09/1999    Inferior wall thinning with possible mild ischemia versus infarct. This would correlate well with his known RCA occlusion    FAMILY HISTORY: Family History  Problem Relation Age of Onset  . Heart disease Mother   . Heart attack Father 64    SOCIAL HISTORY: Social History   Social History  . Marital Status: Widowed    Spouse Name: N/A  . Number of Children: 92  . Years of Education: 11   Occupational History  . Not on file.   Social History Main Topics  . Smoking status: Former Smoker -- 1.00 packs/day for 10 years    Types: Cigarettes    Quit date: 03/08/1988  . Smokeless tobacco: Never  Used     Comment: smoked cigars and cigarettes  . Alcohol Use: No  . Drug Use: No  . Sexual Activity: Not on file   Other Topics Concern  . Not on file   Social History Narrative   Pt is widowed father of 44, 28 grandchildren, 70 great-grandchildren and 3 great-great-grandchildren.  Terry Robertson does not get routine exercise.   Patient has a high school education.   Patient is right-handed.   Patient drinks one cup of coffee daily.     PHYSICAL EXAM  Filed Vitals:   01/18/15 1100  Height: '5\' 5"'  (1.651 m)  Weight: 242 lb (109.77 kg)   Body mass index is 40.27 kg/(m^2). Generalized: Well developed, obese male in no acute distress   Neurological examination  Mentation: Alert oriented to time, place, history taking. Follows all commands speech and language fluent Cranial nerve II-XII: Pupils were equal round reactive to light. Extraocular movements were full, visual field were full on confrontational test. Facial sensation and strength were normal. Uvula tongue midline. Head turning and shoulder shrug were normal and symmetric. Motor: The motor testing reveals 5 over 5 strength of all 4 extremities. Good symmetric motor tone is noted throughout.  Sensory: Sensory testing is intact to soft touch on all 4 extremities. No evidence of extinction is noted.  Coordination: Cerebellar testing reveals good finger-nose-finger and heel-to-shin bilaterally.  Gait and station: patient uses a cane when ambulating. Stooped posture- gait is slow and cautious. Tandem gait not attempted. Romberg is negative. No drift is seen.  Reflexes: Deep tendon reflexes are symmetric and normal bilaterally.   DIAGNOSTIC DATA (LABS, IMAGING, TESTING) - I reviewed patient records, labs, notes, testing and imaging myself where available.  Lab Results  Component Value Date   WBC 6.2 12/29/2014   HGB 12.3* 12/29/2014   HCT 38.9 12/29/2014   MCV 82.8 12/29/2014   PLT 117 Large platelets present* 12/29/2014        Component Value Date/Time   NA 139 12/29/2014 1036   NA 135 09/21/2014 1127   K 3.4* 12/29/2014 1036   K 3.7 09/21/2014 1127   CL 105 09/21/2014 1127   CL 109* 01/02/2012 1526   CO2 27 12/29/2014 1036   CO2 25 09/21/2014 1127   GLUCOSE 118 12/29/2014 1036  GLUCOSE 77 09/21/2014 1127   GLUCOSE 118* 01/02/2012 1526   BUN 14.8 12/29/2014 1036   BUN 10 09/21/2014 1127   CREATININE 0.8 12/29/2014 1036   CREATININE 0.89 09/21/2014 1127   CREATININE 0.98 08/14/2014 0735   CALCIUM 8.6 12/29/2014 1036   CALCIUM 8.3* 09/21/2014 1127   PROT 8.3 12/29/2014 1036   PROT 11.0* 09/21/2014 1127   ALBUMIN 2.9* 12/29/2014 1036   ALBUMIN 2.8* 09/21/2014 1127   AST 21 12/29/2014 1036   AST 20 09/21/2014 1127   ALT 29 12/29/2014 1036   ALT 9 09/21/2014 1127   ALKPHOS 57 12/29/2014 1036   ALKPHOS 46 09/21/2014 1127   BILITOT <0.30 12/29/2014 1036   BILITOT 0.3 09/21/2014 1127   GFRNONAA >60 08/14/2014 0735   GFRAA >60 08/14/2014 0735   Lab Results  Component Value Date   CHOL 75 09/21/2014   HDL 32* 09/21/2014   LDLCALC 29 09/21/2014   TRIG 68 09/21/2014   CHOLHDL 2.3 09/21/2014   Lab Results  Component Value Date   HGBA1C 5.9* 08/12/2014   Lab Results  Component Value Date   VITAMINB12 493 08/09/2014   ASSESSMENT AND PLAN  81 y.o. year old male  has a past medical history of  Diabetes mellitus; Hypertension; Stroke (08/2011, 03/2012); Seizures; OSA on CPAP; Paroxysmal atrial fibrillation; Obesity, morbid; Heart attack (1997); CAD S/P percutaneous coronary angioplasty (1997); Myelodysplastic syndrome; BPH (benign prostatic hyperplasia); CHF (congestive heart failure); and Asbestosis. here to follow-up. Terry Robertson has not had further stroke or TIA symptoms. Terry Robertson has not had any seizure activity since last seen.  PLAN:Continue Keppra 500 mg twice daily for seizure disorder will refill Call for any seizure activity Continue baby aspirin and Coumadin  for secondary stroke prevention and atrial  fibrillation Reviewed recent lipid panel from 09/21/14. Continue Lipitor as directed Keep blood pressure systolic less than 413 today's reading 169/74, take meds as directed Follow-up yearly and when necessary next with Dr. Luan Pulling, [facility], [facility], [facility] [address], [place], [place] [address] [phone]

## 2015-01-21 ENCOUNTER — Other Ambulatory Visit: Payer: Self-pay | Admitting: Hematology and Oncology

## 2015-01-21 ENCOUNTER — Other Ambulatory Visit (HOSPITAL_BASED_OUTPATIENT_CLINIC_OR_DEPARTMENT_OTHER): Payer: Commercial Managed Care - HMO

## 2015-01-21 ENCOUNTER — Telehealth: Payer: Self-pay | Admitting: Hematology and Oncology

## 2015-01-21 ENCOUNTER — Encounter: Payer: Self-pay | Admitting: Hematology and Oncology

## 2015-01-21 ENCOUNTER — Ambulatory Visit (HOSPITAL_BASED_OUTPATIENT_CLINIC_OR_DEPARTMENT_OTHER): Payer: Commercial Managed Care - HMO

## 2015-01-21 ENCOUNTER — Ambulatory Visit (HOSPITAL_BASED_OUTPATIENT_CLINIC_OR_DEPARTMENT_OTHER): Payer: Commercial Managed Care - HMO | Admitting: Hematology and Oncology

## 2015-01-21 VITALS — BP 159/68 | HR 75 | Temp 98.1°F | Resp 18 | Ht 65.0 in | Wt 241.9 lb

## 2015-01-21 DIAGNOSIS — D61810 Antineoplastic chemotherapy induced pancytopenia: Secondary | ICD-10-CM

## 2015-01-21 DIAGNOSIS — I10 Essential (primary) hypertension: Secondary | ICD-10-CM

## 2015-01-21 DIAGNOSIS — D469 Myelodysplastic syndrome, unspecified: Secondary | ICD-10-CM

## 2015-01-21 DIAGNOSIS — C9000 Multiple myeloma not having achieved remission: Secondary | ICD-10-CM | POA: Diagnosis not present

## 2015-01-21 DIAGNOSIS — T451X5A Adverse effect of antineoplastic and immunosuppressive drugs, initial encounter: Secondary | ICD-10-CM

## 2015-01-21 DIAGNOSIS — I480 Paroxysmal atrial fibrillation: Secondary | ICD-10-CM

## 2015-01-21 LAB — CBC WITH DIFFERENTIAL/PLATELET
BASO%: 0.2 % (ref 0.0–2.0)
Basophils Absolute: 0 10*3/uL (ref 0.0–0.1)
EOS%: 0.5 % (ref 0.0–7.0)
Eosinophils Absolute: 0 10*3/uL (ref 0.0–0.5)
HEMATOCRIT: 35 % — AB (ref 38.4–49.9)
HGB: 10.9 g/dL — ABNORMAL LOW (ref 13.0–17.1)
LYMPH%: 38.3 % (ref 14.0–49.0)
MCH: 26.5 pg — ABNORMAL LOW (ref 27.2–33.4)
MCHC: 31.1 g/dL — AB (ref 32.0–36.0)
MCV: 85 fL (ref 79.3–98.0)
MONO#: 1.2 10*3/uL — AB (ref 0.1–0.9)
MONO%: 30.1 % — AB (ref 0.0–14.0)
NEUT%: 30.9 % — ABNORMAL LOW (ref 39.0–75.0)
NEUTROS ABS: 1.3 10*3/uL — AB (ref 1.5–6.5)
NRBC: 0 % (ref 0–0)
Platelets: 75 10*3/uL — ABNORMAL LOW (ref 140–400)
RBC: 4.12 10*6/uL — AB (ref 4.20–5.82)
RDW: 18.4 % — AB (ref 11.0–14.6)
WBC: 4.1 10*3/uL (ref 4.0–10.3)
lymph#: 1.6 10*3/uL (ref 0.9–3.3)

## 2015-01-21 LAB — COMPREHENSIVE METABOLIC PANEL (CC13)
ALBUMIN: 2.7 g/dL — AB (ref 3.5–5.0)
ALK PHOS: 64 U/L (ref 40–150)
ALT: 12 U/L (ref 0–55)
AST: 16 U/L (ref 5–34)
Anion Gap: 9 mEq/L (ref 3–11)
BUN: 8.8 mg/dL (ref 7.0–26.0)
CALCIUM: 8.6 mg/dL (ref 8.4–10.4)
CO2: 26 mEq/L (ref 22–29)
CREATININE: 1 mg/dL (ref 0.7–1.3)
Chloride: 107 mEq/L (ref 98–109)
EGFR: 85 mL/min/{1.73_m2} — ABNORMAL LOW (ref >90)
GLUCOSE: 116 mg/dL (ref 70–140)
Potassium: 3.2 mEq/L — ABNORMAL LOW (ref 3.5–5.1)
Sodium: 142 mEq/L (ref 136–145)
TOTAL PROTEIN: 8.1 g/dL (ref 6.4–8.3)
Total Bilirubin: 0.42 mg/dL (ref 0.20–1.20)

## 2015-01-21 MED ORDER — ZOLEDRONIC ACID 4 MG/100ML IV SOLN
4.0000 mg | Freq: Once | INTRAVENOUS | Status: AC
  Administered 2015-01-21: 4 mg via INTRAVENOUS
  Filled 2015-01-21: qty 100

## 2015-01-21 MED ORDER — SODIUM CHLORIDE 0.9 % IV SOLN
Freq: Once | INTRAVENOUS | Status: AC
  Administered 2015-01-21: 11:00:00 via INTRAVENOUS

## 2015-01-21 NOTE — Assessment & Plan Note (Signed)
He appears to be responding well to treatment. He has completed dexamethasone taper. He has obtained dental clearance and I plan to proceed with Zometa today, then every 3 months. In terms of the Revlimid,  I plan to adjust the dose of treatment as above. Next month, I will repeat serum protein electrophoresis and free light chains to assess response to treatment.

## 2015-01-21 NOTE — Progress Notes (Signed)
Okay to treat with today's labs per Office Visit note from [place], MD.  Also, no interventions necessary for K+ 3.2 per Dr. Alvy Bimler.

## 2015-01-21 NOTE — Progress Notes (Signed)
[place] OFFICE PROGRESS NOTE  Patient Care Team: [name], MD as PCP - General (Internal Medicine) Heath Lark, MD as Consulting Physician (Hematology and Oncology)  SUMMARY OF ONCOLOGIC HISTORY:   Myelodysplastic syndrome ([place])   08/05/2011 Initial Diagnosis Myelodysplastic syndrome   10/15/2014 -  Chemotherapy He started taking Revlimid weekly along with dexamethasone weekly    INTERVAL HISTORY: Please see below for problem oriented charting.  he returns for further follow-up. He feels well. Denies recent infection. No new bone pain. The patient denies any recent signs or symptoms of bleeding such as spontaneous epistaxis, hematuria or hematochezia.   REVIEW OF SYSTEMS:   Constitutional: Denies fevers, chills or abnormal weight loss Eyes: Denies blurriness of vision Ears, nose, mouth, throat, and face: Denies mucositis or sore throat Respiratory: Denies cough, dyspnea or wheezes Cardiovascular: Denies palpitation, chest discomfort or lower extremity swelling Gastrointestinal:  Denies nausea, heartburn or change in bowel habits Skin: Denies abnormal skin rashes Lymphatics: Denies new lymphadenopathy or easy bruising Neurological:Denies numbness, tingling or new weaknesses Behavioral/Psych: Mood is stable, no new changes  All other systems were reviewed with the patient and are negative.  I have reviewed the past medical history, past surgical history, social history and family history with the patient and they are unchanged from previous note.  ALLERGIES:  has No Known Allergies.  MEDICATIONS:  Current Outpatient Prescriptions  Medication Sig Dispense Refill  . Ascorbic Acid (VITAMIN C) 1000 MG tablet Take 1,000 mg by mouth daily.    Marland Kitchen aspirin EC 81 MG tablet Take 81 mg by mouth daily.    Marland Kitchen atenolol (TENORMIN) 25 MG tablet Take 1 tablet (25 mg total) by mouth daily. 30 tablet 3  . atorvastatin (LIPITOR) 20 MG tablet Take 20 mg by mouth daily.    .  Cholecalciferol (VITAMIN D) 2000 UNITS CAPS Take 1 capsule by mouth.    . dexamethasone (DECADRON) 4 MG tablet Take 5 tablets (20 mg total) by mouth once a week. 60 tablet 0  . diltiazem (DILACOR XR) 180 MG 24 hr capsule Take 180 mg by mouth daily.    . furosemide (LASIX) 20 MG tablet Take 20 mg by mouth daily.    Marland Kitchen gabapentin (NEURONTIN) 100 MG capsule Take 100 mg by mouth daily.    Marland Kitchen HYDROcodone-acetaminophen (NORCO/VICODIN) 5-325 MG per tablet Take 1 tablet by mouth 2 (two) times daily as needed. Take 1 tab bid or tid prn  0  . latanoprost (XALATAN) 0.005 % ophthalmic solution Place 1 drop into both eyes at bedtime.   4  . lenalidomide (REVLIMID) 5 MG capsule Take 1 capsule daily for 21 days, then off for 7 days 21 capsule 0  . levETIRAcetam (KEPPRA) 500 MG tablet Take 1 tablet (500 mg total) by mouth every 12 (twelve) hours. 180 tablet 3  . Multiple Vitamins-Minerals (MENS MULTIVITAMIN PLUS PO) Take 1 tablet by mouth.    Marland Kitchen MYRBETRIQ 50 MG TB24 tablet Take 50 mg by mouth daily.  3  . VASCEPA 1 G CAPS Take 1 g by mouth.  2  . warfarin (COUMADIN) 5 MG tablet Take 1 tablet (5 mg total) by mouth daily. 135 tablet 1  . feeding supplement, ENSURE ENLIVE, (ENSURE ENLIVE) LIQD Take 237 mLs by mouth 2 (two) times daily between meals. (Patient not taking: Reported on 01/21/2015) 237 mL 12   No current facility-administered medications for this visit.    PHYSICAL EXAMINATION: ECOG PERFORMANCE STATUS: 2 - Symptomatic, <50% confined to bed  Filed Vitals:   01/21/15 0922  BP: 159/68  Pulse: 75  Temp: 98.1 F (36.7 C)  Resp: 18   Filed Weights   01/21/15 0922  Weight: 241 lb 14.4 oz (109.725 kg)    GENERAL:alert, no distress and comfortable. He is morbidly obese SKIN: skin color, texture, turgor are normal, no rashes or significant lesions EYES: normal, Conjunctiva are pink and non-injected, sclera clear Musculoskeletal:no cyanosis of digits and no clubbing  NEURO: alert & oriented x 3 with  fluent speech, no focal motor/sensory deficits  LABORATORY DATA:  I have reviewed the data as listed    Component Value Date/Time   NA 139 12/29/2014 1036   NA 135 09/21/2014 1127   K 3.4* 12/29/2014 1036   K 3.7 09/21/2014 1127   CL 105 09/21/2014 1127   CL 109* 01/02/2012 1526   CO2 27 12/29/2014 1036   CO2 25 09/21/2014 1127   GLUCOSE 118 12/29/2014 1036   GLUCOSE 77 09/21/2014 1127   GLUCOSE 118* 01/02/2012 1526   BUN 14.8 12/29/2014 1036   BUN 10 09/21/2014 1127   CREATININE 0.8 12/29/2014 1036   CREATININE 0.89 09/21/2014 1127   CREATININE 0.98 08/14/2014 0735   CALCIUM 8.6 12/29/2014 1036   CALCIUM 8.3* 09/21/2014 1127   PROT 8.3 12/29/2014 1036   PROT 11.0* 09/21/2014 1127   ALBUMIN 2.9* 12/29/2014 1036   ALBUMIN 2.8* 09/21/2014 1127   AST 21 12/29/2014 1036   AST 20 09/21/2014 1127   ALT 29 12/29/2014 1036   ALT 9 09/21/2014 1127   ALKPHOS 57 12/29/2014 1036   ALKPHOS 46 09/21/2014 1127   BILITOT <0.30 12/29/2014 1036   BILITOT 0.3 09/21/2014 1127   GFRNONAA >60 08/14/2014 0735   GFRAA >60 08/14/2014 0735    No results found for: SPEP, UPEP  Lab Results  Component Value Date   WBC 4.1 01/21/2015   NEUTROABS 1.3* 01/21/2015   HGB 10.9* 01/21/2015   HCT 35.0* 01/21/2015   MCV 85.0 01/21/2015   PLT 75* 01/21/2015      Chemistry      Component Value Date/Time   NA 139 12/29/2014 1036   NA 135 09/21/2014 1127   K 3.4* 12/29/2014 1036   K 3.7 09/21/2014 1127   CL 105 09/21/2014 1127   CL 109* 01/02/2012 1526   CO2 27 12/29/2014 1036   CO2 25 09/21/2014 1127   BUN 14.8 12/29/2014 1036   BUN 10 09/21/2014 1127   CREATININE 0.8 12/29/2014 1036   CREATININE 0.89 09/21/2014 1127   CREATININE 0.98 08/14/2014 0735      Component Value Date/Time   CALCIUM 8.6 12/29/2014 1036   CALCIUM 8.3* 09/21/2014 1127   ALKPHOS 57 12/29/2014 1036   ALKPHOS 46 09/21/2014 1127   AST 21 12/29/2014 1036   AST 20 09/21/2014 1127   ALT 29 12/29/2014 1036   ALT 9  09/21/2014 1127   BILITOT <0.30 12/29/2014 1036   BILITOT 0.3 09/21/2014 1127     ASSESSMENT & PLAN:  Myelodysplastic syndrome  His blood tests today show mild pancytopenia. It could be a combination of 2 different reasons, related to recent steroid taper and increase dose of Revlimid.  I recommend we continue to hold restarting him back on steroids but plan to reduce the Revlimid to once a week. In the future, I plan to reduce the dose of Revlimid from 5 mg 2.5 mg. I plan to recheck his blood work a month from now and reassess to see if his  dose of medications needs to be further adjusted.  Multiple myeloma He appears to be responding well to treatment. He has completed dexamethasone taper. He has obtained dental clearance and I plan to proceed with Zometa today, then every 3 months. In terms of the Revlimid,  I plan to adjust the dose of treatment as above. Next month, I will repeat serum protein electrophoresis and free light chains to assess response to treatment.  Essential hypertension His blood pressure is high likely due to fluid retention from dexamethasone. Hopefully, once I taper him off dexamethasone, his blood pressure will be back to normal.  PAF (paroxysmal atrial fibrillation)  He is on oral antiplatelet agent and anticoagulation therapy for atrial fibrillation and prevention of stroke. His platelet count is a little low but as long as it remains above 50,000, there is no contraindication for him to remain on those medications for now  Pancytopenia due to antineoplastic chemotherapy [facility])  This is related to his treatment. The patient denies any recent signs or symptoms of bleeding such as spontaneous epistaxis, hematuria or hematochezia. He is not symptomatic. As above, I plan to reduce the dose of Revlimid as above.   Orders Placed This Encounter  Procedures  . SPEP & IFE with [facility]    Standing Status: Future     Number of Occurrences:      Standing Expiration  Date: 02/25/2016  . Kappa/lambda light chains    Standing Status: Future     Number of Occurrences:      Standing Expiration Date: 02/25/2016   All questions were answered. The patient knows to call the clinic with any problems, questions or concerns. No barriers to learning was detected. I spent 25 minutes counseling the patient face to face. The total time spent in the appointment was 30 minutes and more than 50% was on counseling and review of test results     [facility], [place], MD 01/21/2015 9:52 AM

## 2015-01-21 NOTE — Assessment & Plan Note (Signed)
His blood tests today show mild pancytopenia. It could be a combination of 2 different reasons, related to recent steroid taper and increase dose of Revlimid.  I recommend we continue to hold restarting him back on steroids but plan to reduce the Revlimid to once a week. In the future, I plan to reduce the dose of Revlimid from 5 mg 2.5 mg. I plan to recheck his blood work a month from now and reassess to see if his dose of medications needs to be further adjusted.

## 2015-01-21 NOTE — Assessment & Plan Note (Signed)
This is related to his treatment. The patient denies any recent signs or symptoms of bleeding such as spontaneous epistaxis, hematuria or hematochezia. He is not symptomatic. As above, I plan to reduce the dose of Revlimid as above.

## 2015-01-21 NOTE — Telephone Encounter (Signed)
per p[facility] tosc sch pt appt-gave pt copy of avs

## 2015-01-21 NOTE — Patient Instructions (Signed)
Zoledronic Acid injection (Hypercalcemia, Oncology)  What is this medicine?  ZOLEDRONIC ACID (ZOE le dron ik AS id) lowers the amount of calcium loss from bone. It is used to treat too much calcium in your blood from cancer. It is also used to prevent complications of cancer that has spread to the bone.  This medicine may be used for other purposes; ask your health care provider or pharmacist if you have questions.  What should I tell my health care provider before I take this medicine?  They need to know if you have any of these conditions:  -aspirin-sensitive asthma  -cancer, especially if you are receiving medicines used to treat cancer  -dental disease or wear dentures  -infection  -kidney disease  -receiving corticosteroids like dexamethasone or prednisone  -an unusual or allergic reaction to zoledronic acid, other medicines, foods, dyes, or preservatives  -pregnant or trying to get pregnant  -breast-feeding  How should I use this medicine?  This medicine is for infusion into a vein. It is given by a health care professional in a hospital or clinic setting.  Talk to your pediatrician regarding the use of this medicine in children. Special care may be needed.  Overdosage: If you think you have taken too much of this medicine contact a poison control center or emergency room at once.  NOTE: This medicine is only for you. Do not share this medicine with others.  What if I miss a dose?  It is important not to miss your dose. Call your doctor or health care professional if you are unable to keep an appointment.  What may interact with this medicine?  -certain antibiotics given by injection  -NSAIDs, medicines for pain and inflammation, like ibuprofen or naproxen  -some diuretics like bumetanide, furosemide  -teriparatide  -thalidomide  This list may not describe all possible interactions. Give your health care provider a list of all the medicines, herbs, non-prescription drugs, or dietary supplements you use. Also  tell them if you smoke, drink alcohol, or use illegal drugs. Some items may interact with your medicine.  What should I watch for while using this medicine?  Visit your doctor or health care professional for regular checkups. It may be some time before you see the benefit from this medicine. Do not stop taking your medicine unless your doctor tells you to. Your doctor may order blood tests or other tests to see how you are doing.  Women should inform their doctor if they wish to become pregnant or think they might be pregnant. There is a potential for serious side effects to an unborn child. Talk to your health care professional or pharmacist for more information.  You should make sure that you get enough calcium and vitamin D while you are taking this medicine. Discuss the foods you eat and the vitamins you take with your health care professional.  Some people who take this medicine have severe bone, joint, and/or muscle pain. This medicine may also increase your risk for jaw problems or a broken thigh bone. Tell your doctor right away if you have severe pain in your jaw, bones, joints, or muscles. Tell your doctor if you have any pain that does not go away or that gets worse.  Tell your dentist and dental surgeon that you are taking this medicine. You should not have major dental surgery while on this medicine. See your dentist to have a dental exam and fix any dental problems before starting this medicine. Take good care   your doctor or health care professional as soon as possible: -allergic reactions like skin rash, itching or hives, swelling of the face, lips, or tongue -anxiety, confusion, or depression -breathing problems -changes in vision -eye pain -feeling faint or lightheaded, falls -jaw pain,  especially after dental work -mouth sores -muscle cramps, stiffness, or weakness -redness, blistering, peeling or loosening of the skin, including inside the mouth -trouble passing urine or change in the amount of urine Side effects that usually do not require medical attention (report to your doctor or health care professional if they continue or are bothersome): -bone, joint, or muscle pain -constipation -diarrhea -fever -hair loss -irritation at site where injected -loss of appetite -nausea, vomiting -stomach upset -trouble sleeping -trouble swallowing -weak or tired This list may not describe all possible side effects. Call your doctor for medical advice about side effects. You may report side effects to FDA at 1-800-FDA-1088. Where should I keep my medicine? This drug is given in a hospital or clinic and will not be stored at home. NOTE: This sheet is a summary. It may not cover all possible information. If you have questions about this medicine, talk to your doctor, pharmacist, or health care provider.    2016, Elsevier/Gold Standard. (2013-07-19 14:19:39)  [facility] Discharge Instructions for Patients Receiving Chemotherapy  Today you received the following chemotherapy agents Zometa To help prevent nausea and vomiting after your treatment, we encourage you to take your nausea medication as directed.  If you develop nausea and vomiting that is not controlled by your nausea medication, call the clinic.   BELOW ARE SYMPTOMS THAT SHOULD BE REPORTED IMMEDIATELY:  *FEVER GREATER THAN 100.5 F  *CHILLS WITH OR WITHOUT FEVER  NAUSEA AND VOMITING THAT IS NOT CONTROLLED WITH YOUR NAUSEA MEDICATION  *UNUSUAL SHORTNESS OF BREATH  *UNUSUAL BRUISING OR BLEEDING  TENDERNESS IN MOUTH AND THROAT WITH OR WITHOUT PRESENCE OF ULCERS  *URINARY PROBLEMS  *BOWEL PROBLEMS  UNUSUAL RASH Items with * indicate a potential emergency and should be followed up as soon as  possible.  Feel free to call the clinic you have any questions or concerns. The clinic phone number is [phone].  Please show the [place] at check-in to the Emergency Department and triage nurse.

## 2015-01-21 NOTE — Assessment & Plan Note (Signed)
His blood pressure is high likely due to fluid retention from dexamethasone. Hopefully, once I taper him off dexamethasone, his blood pressure will be back to normal. 

## 2015-01-21 NOTE — Assessment & Plan Note (Signed)
He is on oral antiplatelet agent and anticoagulation therapy for atrial fibrillation and prevention of stroke. His platelet count is a little low but as long as it remains above 50,000, there is no contraindication for him to remain on those medications for now

## 2015-01-27 ENCOUNTER — Ambulatory Visit (INDEPENDENT_AMBULATORY_CARE_PROVIDER_SITE_OTHER): Payer: Commercial Managed Care - HMO | Admitting: Pharmacist Clinician (PhC)/ Clinical Pharmacy Specialist

## 2015-01-27 DIAGNOSIS — Z7901 Long term (current) use of anticoagulants: Secondary | ICD-10-CM | POA: Diagnosis not present

## 2015-01-27 DIAGNOSIS — I4891 Unspecified atrial fibrillation: Secondary | ICD-10-CM | POA: Diagnosis not present

## 2015-01-27 DIAGNOSIS — I639 Cerebral infarction, unspecified: Secondary | ICD-10-CM | POA: Diagnosis not present

## 2015-01-27 LAB — POCT INR: INR: 2.6

## 2015-02-12 ENCOUNTER — Telehealth: Payer: Self-pay | Admitting: *Deleted

## 2015-02-12 NOTE — Telephone Encounter (Signed)
Notified [facility] on hold for now.  Next lab on 12/15.  We will send Rx when resumed.  Dose might be reduced to 2.5 mg. They will f/u w/ [place] on 12/16 if they do not get Rx on 12/15.

## 2015-02-17 ENCOUNTER — Ambulatory Visit (INDEPENDENT_AMBULATORY_CARE_PROVIDER_SITE_OTHER): Payer: Commercial Managed Care - HMO | Admitting: Pharmacist Clinician (PhC)/ Clinical Pharmacy Specialist

## 2015-02-17 DIAGNOSIS — I639 Cerebral infarction, unspecified: Secondary | ICD-10-CM | POA: Diagnosis not present

## 2015-02-17 DIAGNOSIS — Z7901 Long term (current) use of anticoagulants: Secondary | ICD-10-CM

## 2015-02-17 DIAGNOSIS — I4891 Unspecified atrial fibrillation: Secondary | ICD-10-CM

## 2015-02-17 LAB — POCT INR: INR: 2.2

## 2015-02-18 ENCOUNTER — Other Ambulatory Visit (HOSPITAL_BASED_OUTPATIENT_CLINIC_OR_DEPARTMENT_OTHER): Payer: Commercial Managed Care - HMO

## 2015-02-18 ENCOUNTER — Telehealth: Payer: Self-pay | Admitting: *Deleted

## 2015-02-18 DIAGNOSIS — C9000 Multiple myeloma not having achieved remission: Secondary | ICD-10-CM | POA: Diagnosis not present

## 2015-02-18 DIAGNOSIS — D469 Myelodysplastic syndrome, unspecified: Secondary | ICD-10-CM

## 2015-02-18 LAB — CBC WITH DIFFERENTIAL/PLATELET
BASO%: 1.4 % (ref 0.0–2.0)
Basophils Absolute: 0 10*3/uL (ref 0.0–0.1)
EOS ABS: 0 10*3/uL (ref 0.0–0.5)
EOS%: 0.8 % (ref 0.0–7.0)
HCT: 33.9 % — ABNORMAL LOW (ref 38.4–49.9)
HGB: 10.5 g/dL — ABNORMAL LOW (ref 13.0–17.1)
LYMPH%: 50.6 % — AB (ref 14.0–49.0)
MCH: 25.8 pg — ABNORMAL LOW (ref 27.2–33.4)
MCHC: 31 g/dL — AB (ref 32.0–36.0)
MCV: 83.4 fL (ref 79.3–98.0)
MONO#: 0.8 10*3/uL (ref 0.1–0.9)
MONO%: 22.9 % — ABNORMAL HIGH (ref 0.0–14.0)
NEUT#: 0.8 10*3/uL — ABNORMAL LOW (ref 1.5–6.5)
NEUT%: 24.3 % — AB (ref 39.0–75.0)
PLATELETS: 86 10*3/uL — AB (ref 140–400)
RBC: 4.07 10*6/uL — AB (ref 4.20–5.82)
RDW: 16.3 % — ABNORMAL HIGH (ref 11.0–14.6)
WBC: 3.3 10*3/uL — ABNORMAL LOW (ref 4.0–10.3)
lymph#: 1.7 10*3/uL (ref 0.9–3.3)

## 2015-02-18 LAB — COMPREHENSIVE METABOLIC PANEL
ALK PHOS: 55 U/L (ref 40–150)
ALT: 10 U/L (ref 0–55)
ANION GAP: 6 meq/L (ref 3–11)
AST: 19 U/L (ref 5–34)
Albumin: 3 g/dL — ABNORMAL LOW (ref 3.5–5.0)
BILIRUBIN TOTAL: 0.43 mg/dL (ref 0.20–1.20)
BUN: 7.7 mg/dL (ref 7.0–26.0)
CHLORIDE: 110 meq/L — AB (ref 98–109)
CO2: 24 meq/L (ref 22–29)
Calcium: 8.4 mg/dL (ref 8.4–10.4)
Creatinine: 1.2 mg/dL (ref 0.7–1.3)
EGFR: 64 mL/min/{1.73_m2} — AB (ref >90)
Glucose: 104 mg/dl (ref 70–140)
Potassium: 3.8 mEq/L (ref 3.5–5.1)
Sodium: 140 mEq/L (ref 136–145)
Total Protein: 9.1 g/dL — ABNORMAL HIGH (ref 6.4–8.3)

## 2015-02-18 NOTE — Telephone Encounter (Signed)
TC from pt's girlfriend to confirm that pt is to hold his Revlimid until next MD visit per note from Dr. Calton Dach nurse, [place] on 02/18/15. Next appt is 02/25/15  [place] voiced understanding.

## 2015-02-18 NOTE — Telephone Encounter (Signed)
Yes please

## 2015-02-18 NOTE — Telephone Encounter (Signed)
-----   Message from Heath Lark, MD sent at 02/18/2015 10:12 AM EST ----- Regarding: hold revlimid His blood count showed pancytopenia Please call him to hold Revlimid ----- Message -----    From: [facility] Interface    Sent: 02/18/2015   9:52 AM      To: Heath Lark, MD

## 2015-02-18 NOTE — Telephone Encounter (Signed)
Attempted to call all numbers listed in pts chart.  Was able to leave VM for pt's "guardian" and requested someone call back so we can give pt instructions per Dr. Alvy Bimler below to hold Revlimid.  POF sent to add lab on 12/22 w/ MD visit.

## 2015-02-19 ENCOUNTER — Telehealth: Payer: Self-pay

## 2015-02-19 ENCOUNTER — Telehealth: Payer: Self-pay | Admitting: *Deleted

## 2015-02-19 NOTE — Telephone Encounter (Signed)
katy from [facility] called requesting update on if pt is going to restart his revlimid. Labs were drawn yesterday [phone], option 1 then option 5.

## 2015-02-19 NOTE — Telephone Encounter (Signed)
I have no plans yet until I see him Hold refill for now

## 2015-02-19 NOTE — Telephone Encounter (Signed)
Notified [employer] of Revlimid continues to be on hold d/t pancytopenia.  Will recheck lab on 12/22.

## 2015-02-25 ENCOUNTER — Encounter: Payer: Self-pay | Admitting: Hematology and Oncology

## 2015-02-25 ENCOUNTER — Ambulatory Visit (HOSPITAL_BASED_OUTPATIENT_CLINIC_OR_DEPARTMENT_OTHER): Payer: Commercial Managed Care - HMO | Admitting: Hematology and Oncology

## 2015-02-25 ENCOUNTER — Other Ambulatory Visit (HOSPITAL_BASED_OUTPATIENT_CLINIC_OR_DEPARTMENT_OTHER): Payer: Commercial Managed Care - HMO

## 2015-02-25 ENCOUNTER — Telehealth: Payer: Self-pay | Admitting: Hematology and Oncology

## 2015-02-25 VITALS — BP 151/58 | HR 69 | Temp 98.2°F | Resp 18 | Ht 65.0 in | Wt 250.6 lb

## 2015-02-25 DIAGNOSIS — D61818 Other pancytopenia: Secondary | ICD-10-CM

## 2015-02-25 DIAGNOSIS — C9000 Multiple myeloma not having achieved remission: Secondary | ICD-10-CM

## 2015-02-25 DIAGNOSIS — D469 Myelodysplastic syndrome, unspecified: Secondary | ICD-10-CM

## 2015-02-25 LAB — SPEP & IFE WITH QIG
ABNORMAL PROTEIN BAND1: 0.6 g/dL
ALBUMIN ELP: 3.1 g/dL — AB (ref 3.8–4.8)
ALPHA-2-GLOBULIN: 1 g/dL — AB (ref 0.5–0.9)
Alpha-1-Globulin: 0.4 g/dL — ABNORMAL HIGH (ref 0.2–0.3)
BETA 2: 0.3 g/dL (ref 0.2–0.5)
Beta Globulin: 0.4 g/dL (ref 0.4–0.6)
GAMMA GLOBULIN: 3.2 g/dL — AB (ref 0.8–1.7)
IGG (IMMUNOGLOBIN G), SERUM: 3230 mg/dL — AB (ref 650–1600)
IGM, SERUM: 115 mg/dL (ref 41–251)
IgA: 184 mg/dL (ref 68–379)
TOTAL PROTEIN, SERUM ELECTROPHOR: 8.4 g/dL — AB (ref 6.1–8.1)

## 2015-02-25 LAB — CBC WITH DIFFERENTIAL/PLATELET
BASO%: 0.3 % (ref 0.0–2.0)
BASOS ABS: 0 10*3/uL (ref 0.0–0.1)
EOS ABS: 0 10*3/uL (ref 0.0–0.5)
EOS%: 0.6 % (ref 0.0–7.0)
HCT: 35.3 % — ABNORMAL LOW (ref 38.4–49.9)
HGB: 10.6 g/dL — ABNORMAL LOW (ref 13.0–17.1)
LYMPH%: 45.9 % (ref 14.0–49.0)
MCH: 26.5 pg — AB (ref 27.2–33.4)
MCHC: 30 g/dL — AB (ref 32.0–36.0)
MCV: 88.3 fL (ref 79.3–98.0)
MONO#: 1 10*3/uL — ABNORMAL HIGH (ref 0.1–0.9)
MONO%: 28.8 % — AB (ref 0.0–14.0)
NEUT#: 0.8 10*3/uL — ABNORMAL LOW (ref 1.5–6.5)
NEUT%: 24.4 % — AB (ref 39.0–75.0)
NRBC: 0 % (ref 0–0)
PLATELETS: 96 10*3/uL — AB (ref 140–400)
RBC: 4 10*6/uL — AB (ref 4.20–5.82)
RDW: 15.6 % — ABNORMAL HIGH (ref 11.0–14.6)
WBC: 3.3 10*3/uL — ABNORMAL LOW (ref 4.0–10.3)
lymph#: 1.5 10*3/uL (ref 0.9–3.3)

## 2015-02-25 LAB — KAPPA/LAMBDA LIGHT CHAINS
KAPPA LAMBDA RATIO: 2.27 — AB (ref 0.26–1.65)
Kappa free light chain: 17 mg/dL — ABNORMAL HIGH (ref 0.33–1.94)
LAMBDA FREE LGHT CHN: 7.48 mg/dL — AB (ref 0.57–2.63)

## 2015-02-25 NOTE — Progress Notes (Signed)
[facility] OFFICE PROGRESS NOTE  Patient Care Team: [name], MD as PCP - General (Internal Medicine) Heath Lark, MD as Consulting Physician (Hematology and Oncology)  SUMMARY OF ONCOLOGIC HISTORY:   Myelodysplastic syndrome ([place])   08/05/2011 Initial Diagnosis Myelodysplastic syndrome   10/15/2014 -  Chemotherapy He started taking Revlimid weekly along with dexamethasone weekly    INTERVAL HISTORY: Please see below for problem oriented charting. He returns for further follow-up. The patient has gained a lot of weight since last time I saw him. He denies recent infection. The patient denies any recent signs or symptoms of bleeding such as spontaneous epistaxis, hematuria or hematochezia.  The patient is sedentary and does not engage in much physical activity. He has completed steroid taper 2 weeks ago. His son has been giving him Revlimid once a week only on Tuesdays.  REVIEW OF SYSTEMS:   Constitutional: Denies fevers, chills or abnormal weight loss Eyes: Denies blurriness of vision Ears, nose, mouth, throat, and face: Denies mucositis or sore throat Respiratory: Denies cough, dyspnea or wheezes Cardiovascular: Denies palpitation, chest discomfort or lower extremity swelling Gastrointestinal:  Denies nausea, heartburn or change in bowel habits Skin: Denies abnormal skin rashes Lymphatics: Denies new lymphadenopathy or easy bruising Neurological:Denies numbness, tingling or new weaknesses Behavioral/Psych: Mood is stable, no new changes  All other systems were reviewed with the patient and are negative.  I have reviewed the past medical history, past surgical history, social history and family history with the patient and they are unchanged from previous note.  ALLERGIES:  has No Known Allergies.  MEDICATIONS:  Current Outpatient Prescriptions  Medication Sig Dispense Refill  . Ascorbic Acid (VITAMIN C) 1000 MG tablet Take 1,000 mg by mouth daily.    Marland Kitchen  aspirin EC 81 MG tablet Take 81 mg by mouth daily.    Marland Kitchen atenolol (TENORMIN) 25 MG tablet Take 1 tablet (25 mg total) by mouth daily. 30 tablet 3  . atorvastatin (LIPITOR) 20 MG tablet Take 20 mg by mouth daily.    . Cholecalciferol (VITAMIN D) 2000 UNITS CAPS Take 1 capsule by mouth.    . dexamethasone (DECADRON) 4 MG tablet Take 5 tablets (20 mg total) by mouth once a week. 60 tablet 0  . diltiazem (DILACOR XR) 180 MG 24 hr capsule Take 180 mg by mouth daily.    . feeding supplement, ENSURE ENLIVE, (ENSURE ENLIVE) LIQD Take 237 mLs by mouth 2 (two) times daily between meals. 237 mL 12  . furosemide (LASIX) 20 MG tablet Take 20 mg by mouth daily.    Marland Kitchen gabapentin (NEURONTIN) 100 MG capsule Take 100 mg by mouth daily.    Marland Kitchen HYDROcodone-acetaminophen (NORCO/VICODIN) 5-325 MG per tablet Take 1 tablet by mouth 2 (two) times daily as needed. Take 1 tab bid or tid prn  0  . latanoprost (XALATAN) 0.005 % ophthalmic solution Place 1 drop into both eyes at bedtime.   4  . lenalidomide (REVLIMID) 5 MG capsule Take 1 capsule daily for 21 days, then off for 7 days 21 capsule 0  . levETIRAcetam (KEPPRA) 500 MG tablet Take 1 tablet (500 mg total) by mouth every 12 (twelve) hours. 180 tablet 3  . Multiple Vitamins-Minerals (MENS MULTIVITAMIN PLUS PO) Take 1 tablet by mouth.    Marland Kitchen MYRBETRIQ 50 MG TB24 tablet Take 50 mg by mouth daily.  3  . VASCEPA 1 G CAPS Take 1 g by mouth.  2  . warfarin (COUMADIN) 5 MG tablet Take 1  tablet (5 mg total) by mouth daily. 135 tablet 1   No current facility-administered medications for this visit.    PHYSICAL EXAMINATION: ECOG PERFORMANCE STATUS: 3 - Symptomatic, >50% confined to bed  Filed Vitals:   02/25/15 0917  BP: 151/58  Pulse: 69  Temp: 98.2 F (36.8 C)  Resp: 18   Filed Weights   02/25/15 0917  Weight: 250 lb 9.6 oz (113.671 kg)    GENERAL:alert, no distress and comfortable. He is morbidly obese SKIN: skin color, texture, turgor are normal, no rashes or  significant lesions EYES: normal, Conjunctiva are pink and non-injected, sclera clear Musculoskeletal:no cyanosis of digits and no clubbing  NEURO: alert & oriented x 3 with fluent speech, no focal motor/sensory deficits  LABORATORY DATA:  I have reviewed the data as listed    Component Value Date/Time   NA 140 02/18/2015 0938   NA 135 09/21/2014 1127   K 3.8 02/18/2015 0938   K 3.7 09/21/2014 1127   CL 105 09/21/2014 1127   CL 109* 01/02/2012 1526   CO2 24 02/18/2015 0938   CO2 25 09/21/2014 1127   GLUCOSE 104 02/18/2015 0938   GLUCOSE 77 09/21/2014 1127   GLUCOSE 118* 01/02/2012 1526   BUN 7.7 02/18/2015 0938   BUN 10 09/21/2014 1127   CREATININE 1.2 02/18/2015 0938   CREATININE 0.89 09/21/2014 1127   CREATININE 0.98 08/14/2014 0735   CALCIUM 8.4 02/18/2015 0938   CALCIUM 8.3* 09/21/2014 1127   PROT 9.1* 02/18/2015 0938   PROT 11.0* 09/21/2014 1127   ALBUMIN 3.0* 02/18/2015 0938   ALBUMIN 2.8* 09/21/2014 1127   AST 19 02/18/2015 0938   AST 20 09/21/2014 1127   ALT 10 02/18/2015 0938   ALT 9 09/21/2014 1127   ALKPHOS 55 02/18/2015 0938   ALKPHOS 46 09/21/2014 1127   BILITOT 0.43 02/18/2015 0938   BILITOT 0.3 09/21/2014 1127   GFRNONAA >60 08/14/2014 0735   GFRAA >60 08/14/2014 0735    No results found for: SPEP, UPEP  Lab Results  Component Value Date   WBC 3.3* 02/25/2015   NEUTROABS 0.8* 02/25/2015   HGB 10.6* 02/25/2015   HCT 35.3* 02/25/2015   MCV 88.3 02/25/2015   PLT 96* 02/25/2015      Chemistry      Component Value Date/Time   NA 140 02/18/2015 0938   NA 135 09/21/2014 1127   K 3.8 02/18/2015 0938   K 3.7 09/21/2014 1127   CL 105 09/21/2014 1127   CL 109* 01/02/2012 1526   CO2 24 02/18/2015 0938   CO2 25 09/21/2014 1127   BUN 7.7 02/18/2015 0938   BUN 10 09/21/2014 1127   CREATININE 1.2 02/18/2015 0938   CREATININE 0.89 09/21/2014 1127   CREATININE 0.98 08/14/2014 0735      Component Value Date/Time   CALCIUM 8.4 02/18/2015 0938    CALCIUM 8.3* 09/21/2014 1127   ALKPHOS 55 02/18/2015 0938   ALKPHOS 46 09/21/2014 1127   AST 19 02/18/2015 0938   AST 20 09/21/2014 1127   ALT 10 02/18/2015 0938   ALT 9 09/21/2014 1127   BILITOT 0.43 02/18/2015 0938   BILITOT 0.3 09/21/2014 1127       ASSESSMENT & PLAN:  Myelodysplastic syndrome He tolerated treatment well and has improvement of anemia. We will continue low-dose treatment with Revlimid without steroids  Multiple myeloma  Unfortunately, full result from serum protein electrophoresis is still pending. Preliminary results suggested improvement of free light chain and M spike. The patient  tolerated treatment well up from pancytopenia.  I recommend Revlimid twice a week for now until I see him back in 2 months. Next time, when his prescription runs out, I will reduce the dose to 2.5 mg to be taken more frequently.  He will continue aspirin for DVT prevention. He will continue calcium with vitamin D supplement and Zometa every 3 months. The next Zometa infusion would be in February.  Pancytopenia ([place])  This is related to his treatment ad his underlying bone marrow disorder The patient denies any recent signs or symptoms of bleeding such as spontaneous epistaxis, hematuria or hematochezia. He is not symptomatic. As above, I plan to reduce the dose of Revlimid as above.  Morbid obesity  The patient is morbidly obese and has gained 25 pounds in 3 months, likely related to sedentary lifestyle, poor eating habits and side effects of steroids. He has tapered off steroids completely in 2 weeks. I recommend graduated exercise as tolerated.   Orders Placed This Encounter  Procedures  . SPEP & IFE with QIG    Standing Status: Future     Number of Occurrences:      Standing Expiration Date: 03/31/2016  . Kappa/lambda light chains    Standing Status: Future     Number of Occurrences:      Standing Expiration Date: 03/31/2016   All questions were answered. The patient  knows to call the clinic with any problems, questions or concerns. No barriers to learning was detected. I spent 20 minutes counseling the patient face to face. The total time spent in the appointment was 25 minutes and more than 50% was on counseling and review of test results     [facility], [place], MD 02/25/2015 12:01 PM

## 2015-02-25 NOTE — Assessment & Plan Note (Signed)
The patient is morbidly obese and has gained 25 pounds in 3 months, likely related to sedentary lifestyle, poor eating habits and side effects of steroids. He has tapered off steroids completely in 2 weeks. I recommend graduated exercise as tolerated.

## 2015-02-25 NOTE — Assessment & Plan Note (Signed)
This is related to his treatment ad his underlying bone marrow disorder The patient denies any recent signs or symptoms of bleeding such as spontaneous epistaxis, hematuria or hematochezia. He is not symptomatic. As above, I plan to reduce the dose of Revlimid as above.

## 2015-02-25 NOTE — Assessment & Plan Note (Signed)
He tolerated treatment well and has improvement of anemia. We will continue low-dose treatment with Revlimid without steroids

## 2015-02-25 NOTE — Telephone Encounter (Signed)
GAVE PATIENT AVS REPORT AND APPOINTMENTS FOR February. °

## 2015-02-25 NOTE — Assessment & Plan Note (Signed)
Unfortunately, full result from serum protein electrophoresis is still pending. Preliminary results suggested improvement of free light chain and M spike. The patient tolerated treatment well up from pancytopenia.  I recommend Revlimid twice a week for now until I see him back in 2 months. Next time, when his prescription runs out, I will reduce the dose to 2.5 mg to be taken more frequently.  He will continue aspirin for DVT prevention. He will continue calcium with vitamin D supplement and Zometa every 3 months. The next Zometa infusion would be in February.

## 2015-03-04 ENCOUNTER — Other Ambulatory Visit: Payer: Self-pay | Admitting: Hematology and Oncology

## 2015-03-16 ENCOUNTER — Ambulatory Visit: Payer: Commercial Managed Care - HMO | Admitting: Podiatry

## 2015-03-17 ENCOUNTER — Ambulatory Visit (INDEPENDENT_AMBULATORY_CARE_PROVIDER_SITE_OTHER): Payer: Commercial Managed Care - HMO | Admitting: Pharmacist Clinician (PhC)/ Clinical Pharmacy Specialist

## 2015-03-17 DIAGNOSIS — Z7901 Long term (current) use of anticoagulants: Secondary | ICD-10-CM | POA: Diagnosis not present

## 2015-03-17 DIAGNOSIS — I639 Cerebral infarction, unspecified: Secondary | ICD-10-CM | POA: Diagnosis not present

## 2015-03-17 DIAGNOSIS — I4891 Unspecified atrial fibrillation: Secondary | ICD-10-CM

## 2015-03-17 LAB — POCT INR: INR: 1.7

## 2015-03-22 ENCOUNTER — Ambulatory Visit (INDEPENDENT_AMBULATORY_CARE_PROVIDER_SITE_OTHER): Payer: Commercial Managed Care - HMO | Admitting: Cardiology

## 2015-03-22 ENCOUNTER — Encounter: Payer: Self-pay | Admitting: Cardiology

## 2015-03-22 VITALS — BP 150/66 | HR 66 | Ht 65.0 in | Wt 248.6 lb

## 2015-03-22 DIAGNOSIS — E785 Hyperlipidemia, unspecified: Secondary | ICD-10-CM

## 2015-03-22 DIAGNOSIS — T451X5A Adverse effect of antineoplastic and immunosuppressive drugs, initial encounter: Secondary | ICD-10-CM

## 2015-03-22 DIAGNOSIS — Z7901 Long term (current) use of anticoagulants: Secondary | ICD-10-CM

## 2015-03-22 DIAGNOSIS — I2510 Atherosclerotic heart disease of native coronary artery without angina pectoris: Secondary | ICD-10-CM

## 2015-03-22 DIAGNOSIS — I480 Paroxysmal atrial fibrillation: Secondary | ICD-10-CM

## 2015-03-22 DIAGNOSIS — I10 Essential (primary) hypertension: Secondary | ICD-10-CM | POA: Diagnosis not present

## 2015-03-22 DIAGNOSIS — Z9861 Coronary angioplasty status: Secondary | ICD-10-CM

## 2015-03-22 DIAGNOSIS — D61810 Antineoplastic chemotherapy induced pancytopenia: Secondary | ICD-10-CM | POA: Diagnosis not present

## 2015-03-22 NOTE — Assessment & Plan Note (Signed)
Followed by oncology 

## 2015-03-22 NOTE — Assessment & Plan Note (Signed)
BP acceptable.

## 2015-03-22 NOTE — Assessment & Plan Note (Signed)
LDL 02 October 2014

## 2015-03-22 NOTE — Patient Instructions (Signed)
Your physician has recommended you make the following change in your medication: [place]  If you need a refill on your cardiac medications before your next appointment, please call your pharmacy.  Your physician recommends that you schedule a follow-up appointment in: [place] DR. [facility].

## 2015-03-22 NOTE — Progress Notes (Signed)
03/22/2015 Elza Rafter Jr.   08-17-1928  [id number]  Primary Physician [name], MD Primary Cardiologist: Dr Ellyn Hack  HPI:  82 y/o obese AA male with a history of CAD, PAF, CVA, mild AS, HTN, and  Myelodysplastic syndrome. He is here for a 6 month check up. He denies any chest pain or SOB. He denies any bleeding problems. He has not been hospitalized since Dr Ellyn Hack saw him last.    Current Outpatient Prescriptions  Medication Sig Dispense Refill  . Ascorbic Acid (VITAMIN C) 1000 MG tablet Take 1,000 mg by mouth daily.    Marland Kitchen aspirin EC 81 MG tablet Take 81 mg by mouth daily.    Marland Kitchen atenolol (TENORMIN) 25 MG tablet Take 1 tablet (25 mg total) by mouth daily. 30 tablet 3  . atorvastatin (LIPITOR) 20 MG tablet Take 20 mg by mouth daily.    . Cholecalciferol (VITAMIN D) 2000 UNITS CAPS Take 1 capsule by mouth.    . dexamethasone (DECADRON) 4 MG tablet Take 5 tablets (20 mg total) by mouth once a week. 60 tablet 0  . diltiazem (DILACOR XR) 180 MG 24 hr capsule Take 180 mg by mouth daily.    . feeding supplement, ENSURE ENLIVE, (ENSURE ENLIVE) LIQD Take 237 mLs by mouth 2 (two) times daily between meals. 237 mL 12  . furosemide (LASIX) 20 MG tablet Take 20 mg by mouth daily.    Marland Kitchen gabapentin (NEURONTIN) 100 MG capsule Take 100 mg by mouth daily.    Marland Kitchen HYDROcodone-acetaminophen (NORCO/VICODIN) 5-325 MG per tablet Take 1 tablet by mouth 2 (two) times daily as needed. Take 1 tab bid or tid prn  0  . latanoprost (XALATAN) 0.005 % ophthalmic solution Place 1 drop into both eyes at bedtime.   4  . lenalidomide (REVLIMID) 5 MG capsule Take 1 capsule daily for 21 days, then off for 7 days 21 capsule 0  . levETIRAcetam (KEPPRA) 500 MG tablet Take 1 tablet (500 mg total) by mouth every 12 (twelve) hours. 180 tablet 3  . Multiple Vitamins-Minerals (MENS MULTIVITAMIN PLUS PO) Take 1 tablet by mouth.    Marland Kitchen MYRBETRIQ 50 MG TB24 tablet Take 50 mg by mouth daily.  3  . VASCEPA 1 G CAPS Take 1 g by mouth.   2  . warfarin (COUMADIN) 5 MG tablet Take 1 tablet (5 mg total) by mouth daily. 135 tablet 1   No current facility-administered medications for this visit.    No Known Allergies  Social History   Social History  . Marital Status: Widowed    Spouse Name: N/A  . Number of Children: 20  . Years of Education: 11   Occupational History  . Not on file.   Social History Main Topics  . Smoking status: Former Smoker -- 1.00 packs/day for 10 years    Types: Cigarettes    Quit date: 03/08/1988  . Smokeless tobacco: Never Used     Comment: smoked cigars and cigarettes  . Alcohol Use: No  . Drug Use: No  . Sexual Activity: Not on file   Other Topics Concern  . Not on file   Social History Narrative   Pt is widowed father of 41, 42 grandchildren, 64 great-grandchildren and 3 great-great-grandchildren.  He does not get routine exercise.   Patient has a high school education.   Patient is right-handed.   Patient drinks one cup of coffee daily.   Living with 2 sons.       Review  of Systems: General: negative for chills, fever, night sweats or weight changes.  Cardiovascular: negative for chest pain, dyspnea on exertion, edema, orthopnea, palpitations, paroxysmal nocturnal dyspnea or shortness of breath Dermatological: negative for rash Respiratory: negative for cough or wheezing Urologic: negative for hematuria Abdominal: negative for nausea, vomiting, diarrhea, bright red blood per rectum, melena, or hematemesis Neurologic: negative for visual changes, syncope, or dizziness All other systems reviewed and are otherwise negative except as noted above.    Blood pressure 150/66, pulse 66, height 5\' 5"  (1.651 m), weight 248 lb 9.6 oz (112.764 kg).  General appearance: alert, cooperative, no distress and morbidly obese Neck: no JVD and transmitted murmur Lungs: clear to auscultation bilaterally Heart: regular rate and rhythm and 2/6 systolic murmur Extremities: trace  edema Neurologic: Grossly normal some trouble with word finding and he needed help with recalling recent events.  EKG NSR, NSST changes  ASSESSMENT AND PLAN:   CAD PCI to LAD/CFX in 1997. Cath '08- medical Rx No angina  Essential hypertension B/P acceptable  PAF (paroxysmal atrial fibrillation) NSR today  Pancytopenia due to antineoplastic chemotherapy ([place]) Followed by oncology  Long term (current) use of anticoagulants No bleeding, on Coumadin  Dyslipidemia, goal LDL below 70 LDL 02 October 2014   PLAN  F/U Dr Ellyn Hack in 6 months.   Ketzia Guzek K PA-C 03/22/2015 8:16 AM

## 2015-03-22 NOTE — Assessment & Plan Note (Signed)
No bleeding, on Coumadin

## 2015-03-22 NOTE — Assessment & Plan Note (Signed)
NSR today 

## 2015-03-22 NOTE — Assessment & Plan Note (Signed)
No angina 

## 2015-04-05 ENCOUNTER — Ambulatory Visit (INDEPENDENT_AMBULATORY_CARE_PROVIDER_SITE_OTHER): Payer: Commercial Managed Care - HMO | Admitting: Pharmacist Clinician (PhC)/ Clinical Pharmacy Specialist

## 2015-04-05 DIAGNOSIS — Z7901 Long term (current) use of anticoagulants: Secondary | ICD-10-CM

## 2015-04-05 DIAGNOSIS — I4891 Unspecified atrial fibrillation: Secondary | ICD-10-CM | POA: Diagnosis not present

## 2015-04-05 DIAGNOSIS — I639 Cerebral infarction, unspecified: Secondary | ICD-10-CM

## 2015-04-05 LAB — POCT INR: INR: 2.1

## 2015-04-07 ENCOUNTER — Ambulatory Visit (INDEPENDENT_AMBULATORY_CARE_PROVIDER_SITE_OTHER): Payer: Commercial Managed Care - HMO | Admitting: Podiatry

## 2015-04-07 ENCOUNTER — Encounter: Payer: Self-pay | Admitting: Podiatry

## 2015-04-07 DIAGNOSIS — B351 Tinea unguium: Secondary | ICD-10-CM | POA: Diagnosis not present

## 2015-04-07 DIAGNOSIS — M79676 Pain in unspecified toe(s): Secondary | ICD-10-CM | POA: Diagnosis not present

## 2015-04-07 NOTE — Patient Instructions (Signed)
Remove the Band-Aid on fourth right toe 1-2 days and apply topical antibiotic ointment daily to the fourth right toe until a scab forms  Diabetes and Foot Care Diabetes may cause you to have problems because of poor blood supply (circulation) to your feet and legs. This may cause the skin on your feet to become thinner, break easier, and heal more slowly. Your skin may become dry, and the skin may peel and crack. You may also have nerve damage in your legs and feet causing decreased feeling in them. You may not notice minor injuries to your feet that could lead to infections or more serious problems. Taking care of your feet is one of the most important things you can do for yourself.  HOME CARE INSTRUCTIONS  Wear shoes at all times, even in the house. Do not go barefoot. Bare feet are easily injured.  Check your feet daily for blisters, cuts, and redness. If you cannot see the bottom of your feet, use a mirror or ask someone for help.  Wash your feet with warm water (do not use hot water) and mild soap. Then pat your feet and the areas between your toes until they are completely dry. Do not soak your feet as this can dry your skin.  Apply a moisturizing lotion or petroleum jelly (that does not contain alcohol and is unscented) to the skin on your feet and to dry, brittle toenails. Do not apply lotion between your toes.  Trim your toenails straight across. Do not dig under them or around the cuticle. File the edges of your nails with an emery board or nail file.  Do not cut corns or calluses or try to remove them with medicine.  Wear clean socks or stockings every day. Make sure they are not too tight. Do not wear knee-high stockings since they may decrease blood flow to your legs.  Wear shoes that fit properly and have enough cushioning. To break in new shoes, wear them for just a few hours a day. This prevents you from injuring your feet. Always look in your shoes before you put them on to be  sure there are no objects inside.  Do not cross your legs. This may decrease the blood flow to your feet.  If you find a minor scrape, cut, or break in the skin on your feet, keep it and the skin around it clean and dry. These areas may be cleansed with mild soap and water. Do not cleanse the area with peroxide, alcohol, or iodine.  When you remove an adhesive bandage, be sure not to damage the skin around it.  If you have a wound, look at it several times a day to make sure it is healing.  Do not use heating pads or hot water bottles. They may burn your skin. If you have lost feeling in your feet or legs, you may not know it is happening until it is too late.  Make sure your health care provider performs a complete foot exam at least annually or more often if you have foot problems. Report any cuts, sores, or bruises to your health care provider immediately. SEEK MEDICAL CARE IF:   You have an injury that is not healing.  You have cuts or breaks in the skin.  You have an ingrown nail.  You notice redness on your legs or feet.  You feel burning or tingling in your legs or feet.  You have pain or cramps in your legs and  feet.  Your legs or feet are numb.  Your feet always feel cold. SEEK IMMEDIATE MEDICAL CARE IF:   There is increasing redness, swelling, or pain in or around a wound.  There is a red line that goes up your leg.  Pus is coming from a wound.  You develop a fever or as directed by your health care provider.  You notice a bad smell coming from an ulcer or wound.   This information is not intended to replace advice given to you by your health care provider. Make sure you discuss any questions you have with your health care provider.   Document Released: 02/18/2000 Document Revised: 10/23/2012 Document Reviewed: 07/30/2012 Elsevier Interactive Patient Education [employer].

## 2015-04-07 NOTE — Progress Notes (Signed)
Patient ID: Terry Robertson., male   DOB: 08-20-1928, 82 y.o.   MRN: [id number]  Subjective: This patient presents for scheduled visit again complaining of elongated and thickened toenails which are cough walking wearing shoes and request toenail debridement. Patient's son present in treatment room  Objective: Orientated 3 The toenails are hypertrophic, elongated, incurvated, discolored and tender to direct palpation 6-10  Assessment: Symptomatic onychomycoses 6-10  Plan: Debrided toenails 6-10 mechanically and electronically. Slight bleeding distal fourth right toe treated with topical antibiotic ointment and Band-Aid. Patient advised to continue applying topical antibiotic ointment and Band-Aid upon removal Band-Aid 1-2 days until a scab forms  Reappoint 3 months

## 2015-04-15 ENCOUNTER — Other Ambulatory Visit: Payer: Self-pay | Admitting: Hematology and Oncology

## 2015-04-15 ENCOUNTER — Other Ambulatory Visit (HOSPITAL_BASED_OUTPATIENT_CLINIC_OR_DEPARTMENT_OTHER): Payer: Commercial Managed Care - HMO

## 2015-04-15 DIAGNOSIS — D469 Myelodysplastic syndrome, unspecified: Secondary | ICD-10-CM | POA: Diagnosis not present

## 2015-04-15 DIAGNOSIS — C9000 Multiple myeloma not having achieved remission: Secondary | ICD-10-CM

## 2015-04-15 LAB — COMPREHENSIVE METABOLIC PANEL
ALK PHOS: 58 U/L (ref 40–150)
ALT: 14 U/L (ref 0–55)
ANION GAP: 8 meq/L (ref 3–11)
AST: 25 U/L (ref 5–34)
Albumin: 3.3 g/dL — ABNORMAL LOW (ref 3.5–5.0)
BILIRUBIN TOTAL: 0.44 mg/dL (ref 0.20–1.20)
BUN: 9.3 mg/dL (ref 7.0–26.0)
CALCIUM: 8.5 mg/dL (ref 8.4–10.4)
CHLORIDE: 107 meq/L (ref 98–109)
CO2: 22 meq/L (ref 22–29)
Creatinine: 1.2 mg/dL (ref 0.7–1.3)
EGFR: 63 mL/min/{1.73_m2} — ABNORMAL LOW (ref >90)
Glucose: 93 mg/dl (ref 70–140)
POTASSIUM: 3.6 meq/L (ref 3.5–5.1)
Sodium: 137 mEq/L (ref 136–145)
Total Protein: 10.4 g/dL — ABNORMAL HIGH (ref 6.4–8.3)

## 2015-04-15 LAB — CBC WITH DIFFERENTIAL/PLATELET
BASO%: 0.2 % (ref 0.0–2.0)
Basophils Absolute: 0 10*3/uL (ref 0.0–0.1)
EOS%: 1.8 % (ref 0.0–7.0)
Eosinophils Absolute: 0.1 10*3/uL (ref 0.0–0.5)
HEMATOCRIT: 37.3 % — AB (ref 38.4–49.9)
HGB: 11.4 g/dL — ABNORMAL LOW (ref 13.0–17.1)
LYMPH%: 44.5 % (ref 14.0–49.0)
MCH: 26.2 pg — AB (ref 27.2–33.4)
MCHC: 30.6 g/dL — AB (ref 32.0–36.0)
MCV: 85.7 fL (ref 79.3–98.0)
MONO#: 1.2 10*3/uL — AB (ref 0.1–0.9)
MONO%: 26.7 % — AB (ref 0.0–14.0)
NEUT#: 1.2 10*3/uL — ABNORMAL LOW (ref 1.5–6.5)
NEUT%: 26.8 % — AB (ref 39.0–75.0)
Platelets: 90 10*3/uL — ABNORMAL LOW (ref 140–400)
RBC: 4.35 10*6/uL (ref 4.20–5.82)
RDW: 15.1 % — ABNORMAL HIGH (ref 11.0–14.6)
WBC: 4.5 10*3/uL (ref 4.0–10.3)
lymph#: 2 10*3/uL (ref 0.9–3.3)
nRBC: 0 % (ref 0–0)

## 2015-04-15 LAB — TECHNOLOGIST REVIEW

## 2015-04-16 LAB — KAPPA/LAMBDA LIGHT CHAINS
Ig Kappa Free Light Chain: 179.42 mg/L — ABNORMAL HIGH (ref 3.30–19.40)
Ig Lambda Free Light Chain: 87.76 mg/L — ABNORMAL HIGH (ref 5.71–26.30)
Kappa/Lambda FluidC Ratio: 2.04 — ABNORMAL HIGH (ref 0.26–1.65)

## 2015-04-19 LAB — MULTIPLE MYELOMA PANEL, SERUM
Albumin SerPl Elph-Mcnc: 3.3 g/dL (ref 2.9–4.4)
Albumin/Glob SerPl: 0.5 — ABNORMAL LOW (ref 0.7–1.7)
Alpha 1: 0.3 g/dL (ref 0.0–0.4)
Alpha2 Glob SerPl Elph-Mcnc: 1.1 g/dL — ABNORMAL HIGH (ref 0.4–1.0)
B-GLOBULIN SERPL ELPH-MCNC: 1 g/dL (ref 0.7–1.3)
GAMMA GLOB SERPL ELPH-MCNC: 4.3 g/dL — AB (ref 0.4–1.8)
GLOBULIN, TOTAL: 6.7 g/dL — AB (ref 2.2–3.9)
IgA, Qn, Serum: 162 mg/dL (ref 61–437)
IgG, Qn, Serum: 5350 mg/dL — ABNORMAL HIGH (ref 700–1600)
IgM, Qn, Serum: 117 mg/dL (ref 15–143)
Total Protein: 10 g/dL — ABNORMAL HIGH (ref 6.0–8.5)

## 2015-04-23 ENCOUNTER — Telehealth: Payer: Self-pay | Admitting: Hematology and Oncology

## 2015-04-23 ENCOUNTER — Ambulatory Visit (HOSPITAL_BASED_OUTPATIENT_CLINIC_OR_DEPARTMENT_OTHER): Payer: Commercial Managed Care - HMO

## 2015-04-23 ENCOUNTER — Telehealth: Payer: Self-pay | Admitting: *Deleted

## 2015-04-23 ENCOUNTER — Ambulatory Visit (HOSPITAL_BASED_OUTPATIENT_CLINIC_OR_DEPARTMENT_OTHER): Payer: Commercial Managed Care - HMO | Admitting: Hematology and Oncology

## 2015-04-23 VITALS — BP 154/58 | HR 69 | Temp 98.3°F | Resp 16 | Ht 65.0 in | Wt 252.6 lb

## 2015-04-23 DIAGNOSIS — C9000 Multiple myeloma not having achieved remission: Secondary | ICD-10-CM

## 2015-04-23 DIAGNOSIS — D61810 Antineoplastic chemotherapy induced pancytopenia: Secondary | ICD-10-CM

## 2015-04-23 DIAGNOSIS — T451X5A Adverse effect of antineoplastic and immunosuppressive drugs, initial encounter: Secondary | ICD-10-CM

## 2015-04-23 DIAGNOSIS — D469 Myelodysplastic syndrome, unspecified: Secondary | ICD-10-CM | POA: Diagnosis not present

## 2015-04-23 DIAGNOSIS — C9001 Multiple myeloma in remission: Secondary | ICD-10-CM

## 2015-04-23 MED ORDER — SODIUM CHLORIDE 0.9 % IV SOLN
Freq: Once | INTRAVENOUS | Status: AC
  Administered 2015-04-23: 10:00:00 via INTRAVENOUS

## 2015-04-23 MED ORDER — ZOLEDRONIC ACID 4 MG/100ML IV SOLN
4.0000 mg | Freq: Once | INTRAVENOUS | Status: AC
  Administered 2015-04-23: 4 mg via INTRAVENOUS
  Filled 2015-04-23: qty 100

## 2015-04-23 NOTE — Assessment & Plan Note (Signed)
Unfortunately, full result from serum protein electrophoresis  Showed the M spike is not detectable  even though serum IgG is elevated, overall we see significant improvement on the myeloma control The patient tolerated treatment well  Apart from pancytopenia. I recommend we increase Revlimid 25 mg 3 times a week and recheck in 3 months Next time, when his prescription runs out, I will reduce the dose to 2.5 mg to be taken more frequently.  He will continue aspirin for DVT prevention. He will continue calcium with vitamin D supplement and Zometa every 3 months. The next Zometa infusion would be in  May

## 2015-04-23 NOTE — Progress Notes (Signed)
[name]  Patient Care Team: [name], MD as PCP - General (Internal Medicine) Heath Lark, MD as Consulting Physician (Hematology and Oncology)  SUMMARY OF ONCOLOGIC HISTORY:   Myelodysplastic syndrome (Terry Robertson)   08/05/2011 Initial Diagnosis Myelodysplastic syndrome   10/15/2014 -  Chemotherapy He started taking Revlimid weekly along with dexamethasone weekly    INTERVAL HISTORY: Please see below for problem oriented charting.  he returns for further follow-up. He feels well. He has good appetite. Denies recent infection. No side effects from treatment The patient denies any recent signs or symptoms of bleeding such as spontaneous epistaxis, hematuria or hematochezia.  denies recent bone pain  REVIEW OF SYSTEMS:   Constitutional: Denies fevers, chills or abnormal weight loss Eyes: Denies blurriness of vision Ears, nose, mouth, throat, and face: Denies mucositis or sore throat Respiratory: Denies cough, dyspnea or wheezes Cardiovascular: Denies palpitation, chest discomfort or lower extremity swelling Gastrointestinal:  Denies nausea, heartburn or change in bowel habits Skin: Denies abnormal skin rashes Lymphatics: Denies new lymphadenopathy or easy bruising Neurological:Denies numbness, tingling or new weaknesses Behavioral/Psych: Mood is stable, no new changes  All other systems were reviewed with the patient and are negative.  I have reviewed the past medical history, past surgical history, social history and family history with the patient and they are unchanged from previous note.  ALLERGIES:  has No Known Allergies.  MEDICATIONS:  Current Outpatient Prescriptions  Medication Sig Dispense Refill  . Ascorbic Acid (VITAMIN C) 1000 MG tablet Take 1,000 mg by mouth daily.    Marland Kitchen aspirin EC 81 MG tablet Take 81 mg by mouth daily.    Marland Kitchen atenolol (TENORMIN) 25 MG tablet Take 1 tablet (25 mg total) by mouth daily. 30 tablet 3  .  atorvastatin (LIPITOR) 20 MG tablet Take 20 mg by mouth daily.    Marland Kitchen CARTIA XT 180 MG 24 hr capsule Take 1 tablet by mouth daily.    . Cholecalciferol (VITAMIN D) 2000 UNITS CAPS Take 1 capsule by mouth.    . diltiazem (DILACOR XR) 180 MG 24 hr capsule Take 180 mg by mouth daily.    . feeding supplement, ENSURE ENLIVE, (ENSURE ENLIVE) LIQD Take 237 mLs by mouth 2 (two) times daily between meals. 237 mL 12  . furosemide (LASIX) 20 MG tablet Take 20 mg by mouth daily.    Marland Kitchen gabapentin (NEURONTIN) 100 MG capsule Take 100 mg by mouth daily.    Marland Kitchen HYDROcodone-acetaminophen (NORCO/VICODIN) 5-325 MG per tablet Take 1 tablet by mouth 2 (two) times daily as needed. Take 1 tab bid or tid prn  0  . latanoprost (XALATAN) 0.005 % ophthalmic solution Place 1 drop into both eyes at bedtime.   4  . lenalidomide (REVLIMID) 5 MG capsule Take 1 capsule daily for 21 days, then off for 7 days 21 capsule 0  . levETIRAcetam (KEPPRA) 500 MG tablet Take 1 tablet (500 mg total) by mouth every 12 (twelve) hours. 180 tablet 3  . Multiple Vitamins-Minerals (MENS MULTIVITAMIN PLUS PO) Take 1 tablet by mouth.    Marland Kitchen MYRBETRIQ 50 MG TB24 tablet Take 50 mg by mouth daily.  3  . VASCEPA 1 G CAPS Take 1 g by mouth.  2  . warfarin (COUMADIN) 5 MG tablet Take 1 tablet (5 mg total) by mouth daily. 135 tablet 1   No current facility-administered medications for this visit.    PHYSICAL EXAMINATION: ECOG PERFORMANCE STATUS: 2 - Symptomatic, <50% confined to bed  Filed Vitals:   04/23/15 0858  BP: 154/58  Pulse: 69  Temp: 98.3 F (36.8 C)  Resp: 16   Filed Weights   04/23/15 0858  Weight: 252 lb 9.6 oz (114.579 kg)    GENERAL:alert, no distress and comfortable. He is morbidly obese SKIN: skin color, texture, turgor are normal, no rashes or significant lesions EYES: normal, Conjunctiva are pink and non-injected, sclera clear OROPHARYNX:no exudate, no erythema and lips, buccal mucosa, and tongue normal  NEURO: alert &  oriented x 3 with fluent speech, no focal motor/sensory deficits  LABORATORY DATA:  I have reviewed the data as listed    Component Value Date/Time   NA 137 04/15/2015 1044   NA 135 09/21/2014 1127   K 3.6 04/15/2015 1044   K 3.7 09/21/2014 1127   CL 105 09/21/2014 1127   CL 109* 01/02/2012 1526   CO2 22 04/15/2015 1044   CO2 25 09/21/2014 1127   GLUCOSE 93 04/15/2015 1044   GLUCOSE 77 09/21/2014 1127   GLUCOSE 118* 01/02/2012 1526   BUN 9.3 04/15/2015 1044   BUN 10 09/21/2014 1127   CREATININE 1.2 04/15/2015 1044   CREATININE 0.89 09/21/2014 1127   CREATININE 0.98 08/14/2014 0735   CALCIUM 8.5 04/15/2015 1044   CALCIUM 8.3* 09/21/2014 1127   PROT 10.4* 04/15/2015 1044   PROT 10.0* 04/15/2015 1044   PROT 11.0* 09/21/2014 1127   ALBUMIN 3.3* 04/15/2015 1044   ALBUMIN 2.8* 09/21/2014 1127   AST 25 04/15/2015 1044   AST 20 09/21/2014 1127   ALT 14 04/15/2015 1044   ALT 9 09/21/2014 1127   ALKPHOS 58 04/15/2015 1044   ALKPHOS 46 09/21/2014 1127   BILITOT 0.44 04/15/2015 1044   BILITOT 0.3 09/21/2014 1127   GFRNONAA >60 08/14/2014 0735   GFRAA >60 08/14/2014 0735    No results found for: SPEP, UPEP  Lab Results  Component Value Date   WBC 4.5 04/15/2015   NEUTROABS 1.2* 04/15/2015   HGB 11.4* 04/15/2015   HCT 37.3* 04/15/2015   MCV 85.7 04/15/2015   PLT 90 Large & giant platelets* 04/15/2015      Chemistry      Component Value Date/Time   NA 137 04/15/2015 1044   NA 135 09/21/2014 1127   K 3.6 04/15/2015 1044   K 3.7 09/21/2014 1127   CL 105 09/21/2014 1127   CL 109* 01/02/2012 1526   CO2 22 04/15/2015 1044   CO2 25 09/21/2014 1127   BUN 9.3 04/15/2015 1044   BUN 10 09/21/2014 1127   CREATININE 1.2 04/15/2015 1044   CREATININE 0.89 09/21/2014 1127   CREATININE 0.98 08/14/2014 0735      Component Value Date/Time   CALCIUM 8.5 04/15/2015 1044   CALCIUM 8.3* 09/21/2014 1127   ALKPHOS 58 04/15/2015 1044   ALKPHOS 46 09/21/2014 1127   AST 25 04/15/2015  1044   AST 20 09/21/2014 1127   ALT 14 04/15/2015 1044   ALT 9 09/21/2014 1127   BILITOT 0.44 04/15/2015 1044   BILITOT 0.3 09/21/2014 1127      ASSESSMENT & PLAN:  Myelodysplastic syndrome (H[facility]) He tolerated treatment well and has improvement of anemia. We will continue low-dose treatment with Revlimid without steroids  I have recommend he increase Revlimid to 5 mg 3 times a week on Mondays, Wednesdays and Fridays    Multiple myeloma  Unfortunately, full result from serum protein electrophoresis  Showed the M spike is not detectable  even though serum IgG is elevated, overall  we see significant improvement on the myeloma control The patient tolerated treatment well  Apart from pancytopenia. I recommend we increase Revlimid 25 mg 3 times a week and recheck in 3 months Next time, when his prescription runs out, I will reduce the dose to 2.5 mg to be taken more frequently.  He will continue aspirin for DVT prevention. He will continue calcium with vitamin D supplement and Zometa every 3 months. The next Zometa infusion would be in  May  Pancytopenia due to antineoplastic chemotherapy [facility])  This is related to his treatment and his underlying bone marrow disorder The patient denies any recent signs or symptoms of bleeding such as spontaneous epistaxis, hematuria or hematochezia. He is not symptomatic. As above, I plan to reduce the dose of Revlimid as above.   Orders Placed This Encounter  Procedures  . CBC with Differential/Platelet    Standing Status: Future     Number of Occurrences:      Standing Expiration Date: 05/27/2016  . Comprehensive metabolic panel    Standing Status: Future     Number of Occurrences:      Standing Expiration Date: 05/27/2016  . Kappa/lambda light chains    Standing Status: Future     Number of Occurrences:      Standing Expiration Date: 05/27/2016  . Multiple Myeloma Panel (SPEP&IFE w/QIG)    Standing Status: Future     Number of Occurrences:       Standing Expiration Date: 05/27/2016   All questions were answered. The patient knows to call the clinic with any problems, questions or concerns. No barriers to learning was detected. I spent 15 minutes counseling the patient face to face. The total time spent in the appointment was 20 minutes and more than 50% was on counseling and review of test results     [facility], [place], MD 04/23/2015 11:16 AM

## 2015-04-23 NOTE — Assessment & Plan Note (Signed)
He tolerated treatment well and has improvement of anemia. We will continue low-dose treatment with Revlimid without steroids  I have recommend he increase Revlimid to 5 mg 3 times a week on Mondays, Wednesdays and Fridays

## 2015-04-23 NOTE — Telephone Encounter (Signed)
Per staff message and POF I have scheduled appts. Advised scheduler of appts. JMW  

## 2015-04-23 NOTE — Telephone Encounter (Signed)
Pt confirmed labs/ov per 02/17 POF, gave pt AVS and Calendar.Cherylann Banas, sent msg to add Zometa

## 2015-04-23 NOTE — Assessment & Plan Note (Signed)
This is related to his treatment and his underlying bone marrow disorder The patient denies any recent signs or symptoms of bleeding such as spontaneous epistaxis, hematuria or hematochezia. He is not symptomatic. As above, I plan to reduce the dose of Revlimid as above.

## 2015-04-23 NOTE — Patient Instructions (Signed)

## 2015-05-03 ENCOUNTER — Ambulatory Visit (INDEPENDENT_AMBULATORY_CARE_PROVIDER_SITE_OTHER): Payer: Commercial Managed Care - HMO | Admitting: Pharmacist Clinician (PhC)/ Clinical Pharmacy Specialist

## 2015-05-03 DIAGNOSIS — Z7901 Long term (current) use of anticoagulants: Secondary | ICD-10-CM | POA: Diagnosis not present

## 2015-05-03 DIAGNOSIS — I639 Cerebral infarction, unspecified: Secondary | ICD-10-CM

## 2015-05-03 DIAGNOSIS — I4891 Unspecified atrial fibrillation: Secondary | ICD-10-CM

## 2015-05-03 LAB — POCT INR: INR: 2

## 2015-05-26 ENCOUNTER — Telehealth: Payer: Self-pay | Admitting: *Deleted

## 2015-05-26 NOTE — Telephone Encounter (Signed)
"  Mr. Castrogiovanni took his last Revlimid today and needs a refill.  Still uses [address]."  Will notify Provider.

## 2015-05-27 ENCOUNTER — Other Ambulatory Visit: Payer: Self-pay | Admitting: *Deleted

## 2015-05-27 DIAGNOSIS — D469 Myelodysplastic syndrome, unspecified: Secondary | ICD-10-CM

## 2015-05-27 DIAGNOSIS — C9000 Multiple myeloma not having achieved remission: Secondary | ICD-10-CM

## 2015-05-27 MED ORDER — LENALIDOMIDE 2.5 MG PO CAPS: 2.5000 mg | ORAL_CAPSULE | Freq: Every day | ORAL | Status: DC

## 2015-05-27 NOTE — Telephone Encounter (Signed)
Please refill.

## 2015-05-28 ENCOUNTER — Other Ambulatory Visit: Payer: Self-pay | Admitting: Cardiology

## 2015-05-28 NOTE — Telephone Encounter (Signed)
Rx request sent to pharmacy.  

## 2015-05-31 ENCOUNTER — Ambulatory Visit (INDEPENDENT_AMBULATORY_CARE_PROVIDER_SITE_OTHER): Payer: Commercial Managed Care - HMO | Admitting: Pharmacist Clinician (PhC)/ Clinical Pharmacy Specialist

## 2015-05-31 DIAGNOSIS — I639 Cerebral infarction, unspecified: Secondary | ICD-10-CM

## 2015-05-31 DIAGNOSIS — Z7901 Long term (current) use of anticoagulants: Secondary | ICD-10-CM | POA: Diagnosis not present

## 2015-05-31 DIAGNOSIS — I4891 Unspecified atrial fibrillation: Secondary | ICD-10-CM

## 2015-05-31 LAB — POCT INR: INR: 2

## 2015-06-08 ENCOUNTER — Telehealth: Payer: Self-pay | Admitting: *Deleted

## 2015-06-08 NOTE — Telephone Encounter (Signed)
Son [name] asking if pt is supposed to have lab work done before his next appt in May (5/11).   He says pt started on new dose Revlmid 2.5 mg yesterday and his instructions state to take daily.

## 2015-06-09 ENCOUNTER — Other Ambulatory Visit: Payer: Self-pay | Admitting: Hematology and Oncology

## 2015-06-09 ENCOUNTER — Telehealth: Payer: Self-pay | Admitting: Hematology and Oncology

## 2015-06-09 DIAGNOSIS — D469 Myelodysplastic syndrome, unspecified: Secondary | ICD-10-CM

## 2015-06-09 NOTE — Telephone Encounter (Signed)
S/w pt's son, Awanda Mink, and instructed Revlimid 2.5 mg daily and Lab every 10 days.   Informed of order sent to Scheduler for labs on 4/14, 4/24 and 5/4.  They will call w/ times.  He verbalized understanding.

## 2015-06-09 NOTE — Telephone Encounter (Signed)
Correct dose is 2.5 mg daily I have placed POF for labs every 10 days

## 2015-06-09 NOTE — Telephone Encounter (Signed)
lvm for pt regarding to April and May appt.... °

## 2015-06-18 ENCOUNTER — Other Ambulatory Visit (HOSPITAL_BASED_OUTPATIENT_CLINIC_OR_DEPARTMENT_OTHER): Payer: Commercial Managed Care - HMO

## 2015-06-18 ENCOUNTER — Telehealth: Payer: Self-pay | Admitting: *Deleted

## 2015-06-18 DIAGNOSIS — C9000 Multiple myeloma not having achieved remission: Secondary | ICD-10-CM | POA: Diagnosis not present

## 2015-06-18 DIAGNOSIS — D469 Myelodysplastic syndrome, unspecified: Secondary | ICD-10-CM

## 2015-06-18 LAB — CBC WITH DIFFERENTIAL/PLATELET
BASO%: 0 % (ref 0.0–2.0)
BASOS ABS: 0 10*3/uL (ref 0.0–0.1)
EOS ABS: 0.1 10*3/uL (ref 0.0–0.5)
EOS%: 2.2 % (ref 0.0–7.0)
HEMATOCRIT: 37.7 % — AB (ref 38.4–49.9)
HEMOGLOBIN: 11.9 g/dL — AB (ref 13.0–17.1)
LYMPH#: 1.4 10*3/uL (ref 0.9–3.3)
LYMPH%: 46.2 % (ref 14.0–49.0)
MCH: 26.6 pg — ABNORMAL LOW (ref 27.2–33.4)
MCHC: 31.6 g/dL — AB (ref 32.0–36.0)
MCV: 84.3 fL (ref 79.3–98.0)
MONO#: 0.7 10*3/uL (ref 0.1–0.9)
MONO%: 21.8 % — ABNORMAL HIGH (ref 0.0–14.0)
NEUT#: 0.9 10*3/uL — ABNORMAL LOW (ref 1.5–6.5)
NEUT%: 29.8 % — AB (ref 39.0–75.0)
NRBC: 0 % (ref 0–0)
RBC: 4.47 10*6/uL (ref 4.20–5.82)
RDW: 16.3 % — AB (ref 11.0–14.6)
WBC: 3.1 10*3/uL — ABNORMAL LOW (ref 4.0–10.3)

## 2015-06-18 LAB — BASIC METABOLIC PANEL
ANION GAP: 8 meq/L (ref 3–11)
BUN: 9.2 mg/dL (ref 7.0–26.0)
CO2: 22 mEq/L (ref 22–29)
Calcium: 8.5 mg/dL (ref 8.4–10.4)
Chloride: 109 mEq/L (ref 98–109)
Creatinine: 1.4 mg/dL — ABNORMAL HIGH (ref 0.7–1.3)
EGFR: 54 mL/min/{1.73_m2} — ABNORMAL LOW (ref >90)
Glucose: 112 mg/dl (ref 70–140)
POTASSIUM: 3.5 meq/L (ref 3.5–5.1)
SODIUM: 139 meq/L (ref 136–145)

## 2015-06-18 NOTE — Telephone Encounter (Signed)
LM for daughter. Spoke with patient. Informed to drink more fluids and continue medication

## 2015-06-18 NOTE — Telephone Encounter (Signed)
-----   Message from Heath Lark, MD sent at 06/18/2015  9:29 AM EDT ----- Regarding: labs PLs call family Continue Revlimid Increase oral fluid intake. Creatinine is mildly elevated ----- Message -----    From: [facility] Interface    Sent: 06/18/2015   9:06 AM      To: Heath Lark, MD

## 2015-06-24 ENCOUNTER — Telehealth: Payer: Self-pay

## 2015-06-24 NOTE — Telephone Encounter (Signed)
Terry Robertson called for appt time Monday for lab. He was told Monday but not what time. Time given.

## 2015-06-28 ENCOUNTER — Ambulatory Visit (INDEPENDENT_AMBULATORY_CARE_PROVIDER_SITE_OTHER): Payer: Commercial Managed Care - HMO | Admitting: Pharmacist

## 2015-06-28 ENCOUNTER — Encounter: Payer: Commercial Managed Care - HMO | Admitting: Pharmacist Clinician (PhC)/ Clinical Pharmacy Specialist

## 2015-06-28 ENCOUNTER — Other Ambulatory Visit (HOSPITAL_BASED_OUTPATIENT_CLINIC_OR_DEPARTMENT_OTHER): Payer: Commercial Managed Care - HMO

## 2015-06-28 ENCOUNTER — Telehealth: Payer: Self-pay | Admitting: *Deleted

## 2015-06-28 DIAGNOSIS — I639 Cerebral infarction, unspecified: Secondary | ICD-10-CM

## 2015-06-28 DIAGNOSIS — D469 Myelodysplastic syndrome, unspecified: Secondary | ICD-10-CM

## 2015-06-28 DIAGNOSIS — Z7901 Long term (current) use of anticoagulants: Secondary | ICD-10-CM | POA: Diagnosis not present

## 2015-06-28 DIAGNOSIS — C9000 Multiple myeloma not having achieved remission: Secondary | ICD-10-CM

## 2015-06-28 DIAGNOSIS — I4891 Unspecified atrial fibrillation: Secondary | ICD-10-CM

## 2015-06-28 LAB — CBC WITH DIFFERENTIAL/PLATELET
BASO%: 1.5 % (ref 0.0–2.0)
BASOS ABS: 0 10*3/uL (ref 0.0–0.1)
EOS%: 1.2 % (ref 0.0–7.0)
Eosinophils Absolute: 0 10*3/uL (ref 0.0–0.5)
HCT: 35.6 % — ABNORMAL LOW (ref 38.4–49.9)
HGB: 11.3 g/dL — ABNORMAL LOW (ref 13.0–17.1)
LYMPH%: 49.6 % — ABNORMAL HIGH (ref 14.0–49.0)
MCH: 25.7 pg — AB (ref 27.2–33.4)
MCHC: 31.8 g/dL — AB (ref 32.0–36.0)
MCV: 81 fL (ref 79.3–98.0)
MONO#: 0.5 10*3/uL (ref 0.1–0.9)
MONO%: 15.2 % — AB (ref 0.0–14.0)
NEUT#: 1 10*3/uL — ABNORMAL LOW (ref 1.5–6.5)
NEUT%: 32.5 % — ABNORMAL LOW (ref 39.0–75.0)
Platelets: 63 10*3/uL — ABNORMAL LOW (ref 140–400)
RBC: 4.4 10*6/uL (ref 4.20–5.82)
RDW: 16.7 % — AB (ref 11.0–14.6)
WBC: 3 10*3/uL — ABNORMAL LOW (ref 4.0–10.3)
lymph#: 1.5 10*3/uL (ref 0.9–3.3)

## 2015-06-28 LAB — BASIC METABOLIC PANEL
Anion Gap: 6 mEq/L (ref 3–11)
BUN: 10.3 mg/dL (ref 7.0–26.0)
CALCIUM: 8.8 mg/dL (ref 8.4–10.4)
CO2: 23 mEq/L (ref 22–29)
Chloride: 108 mEq/L (ref 98–109)
Creatinine: 1.4 mg/dL — ABNORMAL HIGH (ref 0.7–1.3)
EGFR: 54 mL/min/{1.73_m2} — AB (ref >90)
Glucose: 107 mg/dl (ref 70–140)
Potassium: 3.5 mEq/L (ref 3.5–5.1)
Sodium: 138 mEq/L (ref 136–145)

## 2015-06-28 LAB — POCT INR: INR: 2

## 2015-06-28 MED ORDER — LENALIDOMIDE 2.5 MG PO CAPS: 2.5000 mg | ORAL_CAPSULE | Freq: Every day | ORAL | Status: DC

## 2015-06-28 NOTE — Telephone Encounter (Signed)
-----   Message from Heath Lark, MD sent at 06/28/2015 10:25 AM EDT ----- Regarding: blood tests Blood tests stable Continue same dose revlimid  ----- Message -----    From: Lab in [facility]    Sent: 06/28/2015   9:51 AM      To: Heath Lark, MD

## 2015-06-28 NOTE — Telephone Encounter (Signed)
Informed son [name] of Dr. Calton Dach message below.  He verbalized understanding and states pt almost out of Revlimid and needs refill.   Refill sent to [facility] electronically.

## 2015-07-06 ENCOUNTER — Telehealth: Payer: Self-pay | Admitting: *Deleted

## 2015-07-06 NOTE — Telephone Encounter (Signed)
Office fax number requested by Lattie Haw with Dr. Burnell Blanks, Urologist in [place], [place]. who see;'s this patient.  [phone] number provided.

## 2015-07-08 ENCOUNTER — Other Ambulatory Visit (HOSPITAL_BASED_OUTPATIENT_CLINIC_OR_DEPARTMENT_OTHER): Payer: Commercial Managed Care - HMO

## 2015-07-08 ENCOUNTER — Telehealth: Payer: Self-pay | Admitting: *Deleted

## 2015-07-08 DIAGNOSIS — D469 Myelodysplastic syndrome, unspecified: Secondary | ICD-10-CM

## 2015-07-08 DIAGNOSIS — C9000 Multiple myeloma not having achieved remission: Secondary | ICD-10-CM

## 2015-07-08 LAB — CBC WITH DIFFERENTIAL/PLATELET
BASO%: 0 % (ref 0.0–2.0)
BASOS ABS: 0 10*3/uL (ref 0.0–0.1)
EOS%: 0.8 % (ref 0.0–7.0)
Eosinophils Absolute: 0 10*3/uL (ref 0.0–0.5)
HCT: 36.4 % — ABNORMAL LOW (ref 38.4–49.9)
HGB: 11.4 g/dL — ABNORMAL LOW (ref 13.0–17.1)
LYMPH%: 44 % (ref 14.0–49.0)
MCH: 26.3 pg — AB (ref 27.2–33.4)
MCHC: 31.3 g/dL — AB (ref 32.0–36.0)
MCV: 84.1 fL (ref 79.3–98.0)
MONO#: 0.6 10*3/uL (ref 0.1–0.9)
MONO%: 15.2 % — AB (ref 0.0–14.0)
NEUT#: 1.5 10*3/uL (ref 1.5–6.5)
NEUT%: 40 % (ref 39.0–75.0)
NRBC: 0 % (ref 0–0)
Platelets: 58 10*3/uL — ABNORMAL LOW (ref 140–400)
RBC: 4.33 10*6/uL (ref 4.20–5.82)
RDW: 16 % — ABNORMAL HIGH (ref 11.0–14.6)
WBC: 3.8 10*3/uL — ABNORMAL LOW (ref 4.0–10.3)
lymph#: 1.7 10*3/uL (ref 0.9–3.3)

## 2015-07-08 NOTE — Telephone Encounter (Signed)
Spoke with son [name], patient's labs stable. Per dr Alvy Bimler,  he is to continue same dose of revlimid

## 2015-07-08 NOTE — Telephone Encounter (Signed)
-----   Message from Heath Lark, MD sent at 07/08/2015 10:23 AM EDT ----- Regarding: labs Regarding: blood tests Blood tests stable Continue same dose revlimid ----- Message -----    From: Lab in [facility] Interface    Sent: 07/08/2015  10:21 AM      To: Heath Lark, MD

## 2015-07-14 ENCOUNTER — Encounter: Payer: Self-pay | Admitting: Podiatry

## 2015-07-14 ENCOUNTER — Ambulatory Visit (INDEPENDENT_AMBULATORY_CARE_PROVIDER_SITE_OTHER): Payer: Commercial Managed Care - HMO | Admitting: Podiatry

## 2015-07-14 DIAGNOSIS — M79676 Pain in unspecified toe(s): Secondary | ICD-10-CM | POA: Diagnosis not present

## 2015-07-14 DIAGNOSIS — B351 Tinea unguium: Secondary | ICD-10-CM

## 2015-07-14 NOTE — Patient Instructions (Signed)
Today after trimming her toenails are was bleeding from the second and third right toes which were treated with antibiotic ointment and dressings. Remove dressings in 1-3 days and continue apply topical antibiotic ointment daily until a scab forms  Diabetes and Foot Care Diabetes may cause you to have problems because of poor blood supply (circulation) to your feet and legs. This may cause the skin on your feet to become thinner, break easier, and heal more slowly. Your skin may become dry, and the skin may peel and crack. You may also have nerve damage in your legs and feet causing decreased feeling in them. You may not notice minor injuries to your feet that could lead to infections or more serious problems. Taking care of your feet is one of the most important things you can do for yourself.  HOME CARE INSTRUCTIONS  Wear shoes at all times, even in the house. Do not go barefoot. Bare feet are easily injured.  Check your feet daily for blisters, cuts, and redness. If you cannot see the bottom of your feet, use a mirror or ask someone for help.  Wash your feet with warm water (do not use hot water) and mild soap. Then pat your feet and the areas between your toes until they are completely dry. Do not soak your feet as this can dry your skin.  Apply a moisturizing lotion or petroleum jelly (that does not contain alcohol and is unscented) to the skin on your feet and to dry, brittle toenails. Do not apply lotion between your toes.  Trim your toenails straight across. Do not dig under them or around the cuticle. File the edges of your nails with an emery board or nail file.  Do not cut corns or calluses or try to remove them with medicine.  Wear clean socks or stockings every day. Make sure they are not too tight. Do not wear knee-high stockings since they may decrease blood flow to your legs.  Wear shoes that fit properly and have enough cushioning. To break in new shoes, wear them for just a few  hours a day. This prevents you from injuring your feet. Always look in your shoes before you put them on to be sure there are no objects inside.  Do not cross your legs. This may decrease the blood flow to your feet.  If you find a minor scrape, cut, or break in the skin on your feet, keep it and the skin around it clean and dry. These areas may be cleansed with mild soap and water. Do not cleanse the area with peroxide, alcohol, or iodine.  When you remove an adhesive bandage, be sure not to damage the skin around it.  If you have a wound, look at it several times a day to make sure it is healing.  Do not use heating pads or hot water bottles. They may burn your skin. If you have lost feeling in your feet or legs, you may not know it is happening until it is too late.  Make sure your health care provider performs a complete foot exam at least annually or more often if you have foot problems. Report any cuts, sores, or bruises to your health care provider immediately. SEEK MEDICAL CARE IF:   You have an injury that is not healing.  You have cuts or breaks in the skin.  You have an ingrown nail.  You notice redness on your legs or feet.  You feel burning or tingling  in your legs or feet.  You have pain or cramps in your legs and feet.  Your legs or feet are numb.  Your feet always feel cold. SEEK IMMEDIATE MEDICAL CARE IF:   There is increasing redness, swelling, or pain in or around a wound.  There is a red line that goes up your leg.  Pus is coming from a wound.  You develop a fever or as directed by your health care provider.  You notice a bad smell coming from an ulcer or wound.   This information is not intended to replace advice given to you by your health care provider. Make sure you discuss any questions you have with your health care provider.   Document Released: 02/18/2000 Document Revised: 10/23/2012 Document Reviewed: 07/30/2012 Elsevier Interactive Patient  Education [employer].

## 2015-07-15 ENCOUNTER — Telehealth: Payer: Self-pay | Admitting: *Deleted

## 2015-07-15 ENCOUNTER — Other Ambulatory Visit (HOSPITAL_BASED_OUTPATIENT_CLINIC_OR_DEPARTMENT_OTHER): Payer: Commercial Managed Care - HMO

## 2015-07-15 DIAGNOSIS — C9000 Multiple myeloma not having achieved remission: Secondary | ICD-10-CM | POA: Diagnosis not present

## 2015-07-15 DIAGNOSIS — C9001 Multiple myeloma in remission: Secondary | ICD-10-CM

## 2015-07-15 LAB — COMPREHENSIVE METABOLIC PANEL
ALBUMIN: 3.1 g/dL — AB (ref 3.5–5.0)
ALK PHOS: 55 U/L (ref 40–150)
ALT: 20 U/L (ref 0–55)
ANION GAP: 6 meq/L (ref 3–11)
AST: 31 U/L (ref 5–34)
BILIRUBIN TOTAL: 0.56 mg/dL (ref 0.20–1.20)
BUN: 8.9 mg/dL (ref 7.0–26.0)
CO2: 23 mEq/L (ref 22–29)
CREATININE: 1.4 mg/dL — AB (ref 0.7–1.3)
Calcium: 8.5 mg/dL (ref 8.4–10.4)
Chloride: 110 mEq/L — ABNORMAL HIGH (ref 98–109)
EGFR: 54 mL/min/{1.73_m2} — AB (ref >90)
GLUCOSE: 111 mg/dL (ref 70–140)
Potassium: 3.4 mEq/L — ABNORMAL LOW (ref 3.5–5.1)
Sodium: 138 mEq/L (ref 136–145)
TOTAL PROTEIN: 10.4 g/dL — AB (ref 6.4–8.3)

## 2015-07-15 LAB — CBC WITH DIFFERENTIAL/PLATELET
BASO%: 0.3 % (ref 0.0–2.0)
Basophils Absolute: 0 10*3/uL (ref 0.0–0.1)
EOS ABS: 0.1 10*3/uL (ref 0.0–0.5)
EOS%: 1.9 % (ref 0.0–7.0)
HCT: 36.3 % — ABNORMAL LOW (ref 38.4–49.9)
HEMOGLOBIN: 11.4 g/dL — AB (ref 13.0–17.1)
LYMPH#: 1.6 10*3/uL (ref 0.9–3.3)
LYMPH%: 48.9 % (ref 14.0–49.0)
MCH: 26.3 pg — ABNORMAL LOW (ref 27.2–33.4)
MCHC: 31.4 g/dL — ABNORMAL LOW (ref 32.0–36.0)
MCV: 83.8 fL (ref 79.3–98.0)
MONO#: 0.5 10*3/uL (ref 0.1–0.9)
MONO%: 15.9 % — ABNORMAL HIGH (ref 0.0–14.0)
NEUT%: 33 % — ABNORMAL LOW (ref 39.0–75.0)
NEUTROS ABS: 1.1 10*3/uL — AB (ref 1.5–6.5)
RBC: 4.33 10*6/uL (ref 4.20–5.82)
RDW: 15.7 % — ABNORMAL HIGH (ref 11.0–14.6)
WBC: 3.2 10*3/uL — AB (ref 4.0–10.3)

## 2015-07-15 NOTE — Progress Notes (Signed)
Patient ID: Terry Robertson., male   DOB: 03-Jul-1928, 82 y.o.   MRN: [id number]  Subjective: This patient presents for scheduled visit again complaining of elongated and thickened toenails which are cough walking wearing shoes and request toenail debridement. Patient's son present in treatment room  Objective: Orientated 3 The toenails are hypertrophic, elongated, incurvated, discolored and tender to direct palpation 6-10  Assessment: Symptomatic onychomycoses 6-10  Plan: Debrided toenails 6-10 mechanically and electronically. Slight bleeding distal second and third right  toes treated with topical antibiotic ointment and Band-Aids. Patient advised to continue applying topical antibiotic ointment and Band-Aid upon removal Band-Aid 1-2 days until a scab forms  Reappoint 3 months

## 2015-07-15 NOTE — Telephone Encounter (Signed)
-----   Message from Heath Lark, MD sent at 07/15/2015  9:51 AM EDT ----- Regarding: labs Regarding: labs Regarding: blood tests Blood tests stable Continue same dose revlimid  ----- Message -----    From: Lab in [facility] Interface    Sent: 07/15/2015   9:26 AM      To: Heath Lark, MD

## 2015-07-15 NOTE — Telephone Encounter (Signed)
Informed pt of Dr. Calton Dach message below.  He says his son is on the other line but he thinks he can remember to tell him.  Asked him to have son call nurse back if any questions. He verbalized understanding.

## 2015-07-16 LAB — KAPPA/LAMBDA LIGHT CHAINS
IG KAPPA FREE LIGHT CHAIN: 183.95 mg/L — AB (ref 3.30–19.40)
Ig Lambda Free Light Chain: 135.1 mg/L — ABNORMAL HIGH (ref 5.71–26.30)
Kappa/Lambda FluidC Ratio: 1.36 (ref 0.26–1.65)

## 2015-07-20 LAB — MULTIPLE MYELOMA PANEL, SERUM
ALBUMIN/GLOB SERPL: 0.6 — AB (ref 0.7–1.7)
ALPHA2 GLOB SERPL ELPH-MCNC: 0.7 g/dL (ref 0.4–1.0)
Albumin SerPl Elph-Mcnc: 3.4 g/dL (ref 2.9–4.4)
Alpha 1: 0.2 g/dL (ref 0.0–0.4)
B-Globulin SerPl Elph-Mcnc: 0.7 g/dL (ref 0.7–1.3)
GAMMA GLOB SERPL ELPH-MCNC: 4.9 g/dL — AB (ref 0.4–1.8)
GLOBULIN, TOTAL: 6.5 g/dL — AB (ref 2.2–3.9)
IGA/IMMUNOGLOBULIN A, SERUM: 131 mg/dL (ref 61–437)
IGM (IMMUNOGLOBIN M), SRM: 94 mg/dL (ref 15–143)
IgG, Qn, Serum: 5078 mg/dL — ABNORMAL HIGH (ref 700–1600)
Total Protein: 9.9 g/dL — ABNORMAL HIGH (ref 6.0–8.5)

## 2015-07-22 ENCOUNTER — Telehealth: Payer: Self-pay | Admitting: *Deleted

## 2015-07-22 ENCOUNTER — Telehealth: Payer: Self-pay | Admitting: Hematology and Oncology

## 2015-07-22 ENCOUNTER — Ambulatory Visit (HOSPITAL_BASED_OUTPATIENT_CLINIC_OR_DEPARTMENT_OTHER): Payer: Commercial Managed Care - HMO | Admitting: Hematology and Oncology

## 2015-07-22 ENCOUNTER — Ambulatory Visit: Payer: Commercial Managed Care - HMO

## 2015-07-22 VITALS — BP 161/63 | HR 61 | Temp 97.5°F | Resp 17 | Ht 65.0 in | Wt 248.2 lb

## 2015-07-22 DIAGNOSIS — D61810 Antineoplastic chemotherapy induced pancytopenia: Secondary | ICD-10-CM | POA: Diagnosis not present

## 2015-07-22 DIAGNOSIS — K089 Disorder of teeth and supporting structures, unspecified: Secondary | ICD-10-CM

## 2015-07-22 DIAGNOSIS — C9000 Multiple myeloma not having achieved remission: Secondary | ICD-10-CM | POA: Diagnosis not present

## 2015-07-22 DIAGNOSIS — C9001 Multiple myeloma in remission: Secondary | ICD-10-CM | POA: Insufficient documentation

## 2015-07-22 DIAGNOSIS — D469 Myelodysplastic syndrome, unspecified: Secondary | ICD-10-CM | POA: Diagnosis not present

## 2015-07-22 DIAGNOSIS — T451X5A Adverse effect of antineoplastic and immunosuppressive drugs, initial encounter: Secondary | ICD-10-CM

## 2015-07-22 NOTE — Telephone Encounter (Signed)
[place] and s/w pharmacy tech [place].  Notified of Revlimid on Hold right now per Dr. Alvy Bimler.  Pt has a tooth ache that needs to be taken care of and we will send a new Rx when Revlimid is resumed.

## 2015-07-22 NOTE — Telephone Encounter (Signed)
Gave and printed appt sched and avs for pt for Aug °

## 2015-07-23 ENCOUNTER — Encounter: Payer: Self-pay | Admitting: Hematology and Oncology

## 2015-07-23 DIAGNOSIS — K089 Disorder of teeth and supporting structures, unspecified: Secondary | ICD-10-CM | POA: Insufficient documentation

## 2015-07-23 NOTE — Assessment & Plan Note (Signed)
This is related to recent treatment. He is not symptomatic. He does not require further workup or transfusion. We would discontinue Revlimid as above.

## 2015-07-23 NOTE — Progress Notes (Signed)
[facility] [place] OFFICE PROGRESS NOTE  Patient Care Team: [name], MD as PCP - General (Internal Medicine) Heath Lark, MD as Consulting Physician (Hematology and Oncology)  SUMMARY OF ONCOLOGIC HISTORY:   Myelodysplastic syndrome ([place])   08/05/2011 Initial Diagnosis Myelodysplastic syndrome   10/15/2014 -  Chemotherapy He started taking Revlimid weekly along with dexamethasone weekly    INTERVAL HISTORY: Please see below for problem oriented charting. He returns for further follow-up with his sons. He feels well. Denies recent infection. The patient denies any recent signs or symptoms of bleeding such as spontaneous epistaxis, hematuria or hematochezia. He has poor dentition noted. Has mild dental pain.  REVIEW OF SYSTEMS:   Constitutional: Denies fevers, chills or abnormal weight loss Eyes: Denies blurriness of vision Ears, nose, mouth, throat, and face: Denies mucositis or sore throat Respiratory: Denies cough, dyspnea or wheezes Cardiovascular: Denies palpitation, chest discomfort or lower extremity swelling Gastrointestinal:  Denies nausea, heartburn or change in bowel habits Skin: Denies abnormal skin rashes Lymphatics: Denies new lymphadenopathy or easy bruising Neurological:Denies numbness, tingling or new weaknesses Behavioral/Psych: Mood is stable, no new changes  All other systems were reviewed with the patient and are negative.  I have reviewed the past medical history, past surgical history, social history and family history with the patient and they are unchanged from previous note.  ALLERGIES:  has No Known Allergies.  MEDICATIONS:  Current Outpatient Prescriptions  Medication Sig Dispense Refill  . Ascorbic Acid (VITAMIN C) 1000 MG tablet Take 1,000 mg by mouth daily.    Marland Kitchen aspirin EC 81 MG tablet Take 81 mg by mouth daily.    Marland Kitchen atenolol (TENORMIN) 25 MG tablet TAKE 1 TABLET BY MOUTH DAILY 90 tablet 1  . atorvastatin (LIPITOR) 20 MG tablet Take  20 mg by mouth daily.    Marland Kitchen CARTIA XT 180 MG 24 hr capsule Take 1 tablet by mouth daily.    . Cholecalciferol (VITAMIN D) 2000 UNITS CAPS Take 1 capsule by mouth.    . diltiazem (DILACOR XR) 180 MG 24 hr capsule Take 180 mg by mouth daily.    . feeding supplement, ENSURE ENLIVE, (ENSURE ENLIVE) LIQD Take 237 mLs by mouth 2 (two) times daily between meals. 237 mL 12  . furosemide (LASIX) 20 MG tablet Take 20 mg by mouth daily.    Marland Kitchen gabapentin (NEURONTIN) 100 MG capsule Take 100 mg by mouth daily.    Marland Kitchen HYDROcodone-acetaminophen (NORCO/VICODIN) 5-325 MG per tablet Take 1 tablet by mouth 2 (two) times daily as needed. Take 1 tab bid or tid prn  0  . latanoprost (XALATAN) 0.005 % ophthalmic solution Place 1 drop into both eyes at bedtime.   4  . levETIRAcetam (KEPPRA) 500 MG tablet Take 1 tablet (500 mg total) by mouth every 12 (twelve) hours. 180 tablet 3  . Multiple Vitamins-Minerals (MENS MULTIVITAMIN PLUS PO) Take 1 tablet by mouth.    Marland Kitchen MYRBETRIQ 50 MG TB24 tablet Take 50 mg by mouth daily.  3  . VASCEPA 1 G CAPS Take 1 g by mouth.  2  . warfarin (COUMADIN) 5 MG tablet Take 1 tablet (5 mg total) by mouth daily. 135 tablet 1   No current facility-administered medications for this visit.    PHYSICAL EXAMINATION: ECOG PERFORMANCE STATUS: 2 - Symptomatic, <50% confined to bed  Filed Vitals:   07/22/15 0935 07/22/15 0936  BP: 163/59 161/63  Pulse: 61   Temp: 97.5 F (36.4 C)   Resp: 17  Filed Weights   07/22/15 0935  Weight: 248 lb 3.2 oz (112.583 kg)    GENERAL:alert, no distress and comfortable. He is obese SKIN: skin color, texture, turgor are normal, no rashes or significant lesions EYES: normal, Conjunctiva are pink and non-injected, sclera clear OROPHARYNX:no exudate, no erythema and lips, buccal mucosa, and tongue normal . He has poor dentition Musculoskeletal:no cyanosis of digits and no clubbing  NEURO: alert & oriented x 3 with fluent speech, no focal motor/sensory  deficits  LABORATORY DATA:  I have reviewed the data as listed    Component Value Date/Time   NA 138 07/15/2015 0854   NA 135 09/21/2014 1127   K 3.4* 07/15/2015 0854   K 3.7 09/21/2014 1127   CL 105 09/21/2014 1127   CL 109* 01/02/2012 1526   CO2 23 07/15/2015 0854   CO2 25 09/21/2014 1127   GLUCOSE 111 07/15/2015 0854   GLUCOSE 77 09/21/2014 1127   GLUCOSE 118* 01/02/2012 1526   BUN 8.9 07/15/2015 0854   BUN 10 09/21/2014 1127   CREATININE 1.4* 07/15/2015 0854   CREATININE 0.89 09/21/2014 1127   CREATININE 0.98 08/14/2014 0735   CALCIUM 8.5 07/15/2015 0854   CALCIUM 8.3* 09/21/2014 1127   PROT 10.4* 07/15/2015 0854   PROT 9.9* 07/15/2015 0854   PROT 11.0* 09/21/2014 1127   ALBUMIN 3.1* 07/15/2015 0854   ALBUMIN 2.8* 09/21/2014 1127   AST 31 07/15/2015 0854   AST 20 09/21/2014 1127   ALT 20 07/15/2015 0854   ALT 9 09/21/2014 1127   ALKPHOS 55 07/15/2015 0854   ALKPHOS 46 09/21/2014 1127   BILITOT 0.56 07/15/2015 0854   BILITOT 0.3 09/21/2014 1127   GFRNONAA >60 08/14/2014 0735   GFRAA >60 08/14/2014 0735    No results found for: SPEP, UPEP  Lab Results  Component Value Date   WBC 3.2* 07/15/2015   NEUTROABS 1.1* 07/15/2015   HGB 11.4* 07/15/2015   HCT 36.3* 07/15/2015   MCV 83.8 07/15/2015   PLT 60 occ Large platelets present* 07/15/2015      Chemistry      Component Value Date/Time   NA 138 07/15/2015 0854   NA 135 09/21/2014 1127   K 3.4* 07/15/2015 0854   K 3.7 09/21/2014 1127   CL 105 09/21/2014 1127   CL 109* 01/02/2012 1526   CO2 23 07/15/2015 0854   CO2 25 09/21/2014 1127   BUN 8.9 07/15/2015 0854   BUN 10 09/21/2014 1127   CREATININE 1.4* 07/15/2015 0854   CREATININE 0.89 09/21/2014 1127   CREATININE 0.98 08/14/2014 0735      Component Value Date/Time   CALCIUM 8.5 07/15/2015 0854   CALCIUM 8.3* 09/21/2014 1127   ALKPHOS 55 07/15/2015 0854   ALKPHOS 46 09/21/2014 1127   AST 31 07/15/2015 0854   AST 20 09/21/2014 1127   ALT 20  07/15/2015 0854   ALT 9 09/21/2014 1127   BILITOT 0.56 07/15/2015 0854   BILITOT 0.3 09/21/2014 1127       ASSESSMENT & PLAN:  Myelodysplastic syndrome ([place]) While the Revlimid had treated his multiple myeloma, the patient remained pancytopenic from the MDS standpoint. I am concerned about the borderline neutropenia and thrombocytopenia and that could predispose him with significant risk of infection and bleeding. We discussed the risks and benefits of observation and ultimately we agreed to discontinue Revlimid and just observe his blood counts. He is not symptomatic.  Multiple myeloma Recent myeloma panel show undetectable M spike. Serum light chains has improved.  The significant elevated IgG was polyclonal. As above, we will discontinue treatment for multiple myeloma and observe. I am concerned about dental health and will hold off giving him Zometa until further evaluation  Pancytopenia due to antineoplastic chemotherapy [facility]) This is related to recent treatment. He is not symptomatic. He does not require further workup or transfusion. We would discontinue Revlimid as above.  Poor dentition He has poor dentition. He had received 2 doses of Zometa. I recommend holding off of the Zometa until dental clearance.   Orders Placed This Encounter  Procedures  . Multiple Myeloma Panel (SPEP&IFE w/QIG)    Standing Status: Future     Number of Occurrences:      Standing Expiration Date: 08/25/2016  . Kappa/lambda light chains    Standing Status: Future     Number of Occurrences:      Standing Expiration Date: 08/25/2016   All questions were answered. The patient knows to call the clinic with any problems, questions or concerns. No barriers to learning was detected. I spent 20 minutes counseling the patient face to face. The total time spent in the appointment was 25 minutes and more than 50% was on counseling and review of test results     [facility], [place], MD 07/23/2015 4:08  PM

## 2015-07-23 NOTE — Assessment & Plan Note (Signed)
While the Revlimid had treated his multiple myeloma, the patient remained pancytopenic from the MDS standpoint. I am concerned about the borderline neutropenia and thrombocytopenia and that could predispose him with significant risk of infection and bleeding. We discussed the risks and benefits of observation and ultimately we agreed to discontinue Revlimid and just observe his blood counts. He is not symptomatic.

## 2015-07-23 NOTE — Assessment & Plan Note (Signed)
Recent myeloma panel show undetectable M spike. Serum light chains has improved. The significant elevated IgG was polyclonal. As above, we will discontinue treatment for multiple myeloma and observe. I am concerned about dental health and will hold off giving him Zometa until further evaluation

## 2015-07-23 NOTE — Assessment & Plan Note (Signed)
He has poor dentition. He had received 2 doses of Zometa. I recommend holding off of the Zometa until dental clearance.

## 2015-08-03 ENCOUNTER — Ambulatory Visit (INDEPENDENT_AMBULATORY_CARE_PROVIDER_SITE_OTHER): Payer: Commercial Managed Care - HMO | Admitting: Pharmacist

## 2015-08-03 DIAGNOSIS — Z7901 Long term (current) use of anticoagulants: Secondary | ICD-10-CM

## 2015-08-03 DIAGNOSIS — I639 Cerebral infarction, unspecified: Secondary | ICD-10-CM | POA: Diagnosis not present

## 2015-08-03 DIAGNOSIS — I4891 Unspecified atrial fibrillation: Secondary | ICD-10-CM

## 2015-08-03 LAB — POCT INR: INR: 1.8

## 2015-08-04 ENCOUNTER — Ambulatory Visit (INDEPENDENT_AMBULATORY_CARE_PROVIDER_SITE_OTHER)
Admission: RE | Admit: 2015-08-04 | Discharge: 2015-08-04 | Disposition: A | Discharge status: Home / Self Care | Payer: Commercial Managed Care - HMO | Source: Ambulatory Visit | Attending: Pulmonary Disease | Admitting: Pulmonary Disease

## 2015-08-04 ENCOUNTER — Encounter: Payer: Self-pay | Admitting: Pulmonary Disease

## 2015-08-04 ENCOUNTER — Ambulatory Visit: Payer: Commercial Managed Care - HMO | Admitting: Pulmonary Disease

## 2015-08-04 ENCOUNTER — Ambulatory Visit (INDEPENDENT_AMBULATORY_CARE_PROVIDER_SITE_OTHER): Payer: Commercial Managed Care - HMO | Admitting: Pulmonary Disease

## 2015-08-04 VITALS — BP 154/66 | HR 60 | Ht 65.0 in | Wt 244.0 lb

## 2015-08-04 DIAGNOSIS — J942 Hemothorax: Secondary | ICD-10-CM | POA: Diagnosis not present

## 2015-08-04 NOTE — Patient Instructions (Signed)
Return to our clinic if you develop chest pain, shortness of breath, or cough that doesn't go away.

## 2015-08-04 NOTE — Assessment & Plan Note (Signed)
This problem has resolved. We have followed him with serial chest x-rays because the underlying etiology of the hemothorax in April 2015 was never perfectly clear. I think it was most likely due to him being on anticoagulant therapy while he had a severe case of community-acquired pneumonia. Fortunately, there has been no evidence of other pathology on serial chest x-rays.  Once again, today's chest x-ray was clear.   Plan: At this point he doesn't need to follow-up with [place] anymore He can follow-up if there is some other pulmonary problem that develops.

## 2015-08-04 NOTE — Progress Notes (Signed)
Subjective:    Patient ID: Terry Robertson., male    DOB: 02-11-1929, 82 y.o.   MRN: [id number]  Synopsis: Mr. Streett had a hemothorax in 06/2013 for CAP and a hemothorax while on warfarin.  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  HPI Chief Complaint  Patient presents with  . Follow-up    6 month rov with cxr.  Pt states he is doing well with his breathing, no complaints today.     Justice has been doing OK.  He didn't have too much trouble this winter, no serious colds.   His weight has been stable. He has not been coughing. He has mild intermittent left-sided chest pain which comes and goes but this is not been a regular problem for him.  Past Medical History  Diagnosis Date  . Diabetes mellitus   . Essential hypertension   . Stroke ([place]) 08/2011, 03/2012    L MCA (Afib RVR while admitted for hypotension) - expressive aphasia (almost totally recovered)  . Seizures ([place])     Post CVA; on Keppra; stable  . OSA on CPAP   . Paroxysmal atrial fibrillation ([facility])     on coumadin; Diagnosed at the time of his 1st CVA  . Obesity, morbid ([place])     BMI  . ST-segment elevation myocardial infarction (STEMI) of inferior wall ([place]) 1997    inferior MI with left circumflex stent  . CAD S/P percutaneous coronary angioplasty 1997    status post remote PCI to the LAD and circumflex in 1997, heart cath in 2008 showed widely patent stents in the circumflex and LAD.  RCA had a mid lesion and then was totally occluded distally.  . Myelodysplastic syndrome ([place])     w/mild anemia an neutropenia  . BPH (benign prostatic hyperplasia)     TURP in 12/2010  . CHF (congestive heart failure) ([place])   . Asbestosis [facility])     family unclear where he was evaluated in the past  . Aortic stenosis, mild 08/10/2014    : EF 60-65%, mod Conc LVH, cannot assess Diastolic Fxn.  Mild AS. Mild MVP without MR., Mod LA/RA dilation.  Marland Kitchen MGUS (monoclonal gammopathy of unknown  significance) 09/21/2014  . Multiple myeloma ([place]) 09/29/2014     Review of Systems     Objective:   Physical Exam Filed Vitals:   08/04/15 1111  BP: 154/66  Pulse: 60  Height: '5\' 5"'  (1.651 m)  Weight: 244 lb (110.678 kg)  SpO2: 96%  RA  Gen: Chronically ill-appearing, no acute distress HEENT: NCAT,EOMi, OP clear PULM: Clear to auscultation bilaterally, otherwise clear CV: RRR, no mgr, no JVD AB: BS+, soft, nontender,  Ext: warm, no edema, no clubbing, no cyanosis Derm: no rash or skin breakdown Neuro: A&Ox4, MAEW   October 2016 chest x-ray images personally reviewed showing improved pleural effusion and pleural thickening changes in the left base, cardiomegaly noted     Assessment & Plan:   Hemothorax, left This problem has resolved. We have followed him with serial chest x-rays because the underlying etiology of the hemothorax in April 2015 was never perfectly clear. I think it was most likely due to him being on anticoagulant therapy while he had a severe case of community-acquired pneumonia. Fortunately, there has been no evidence of other pathology on serial chest x-rays.  Once again, today's chest x-ray was clear.   Plan: At this point he doesn't need to follow-up with  [place] anymore He can follow-up if there is some other pulmonary problem that develops.    Updated Medication List Outpatient Encounter Prescriptions as of 08/04/2015  Medication Sig  . Ascorbic Acid (VITAMIN C) 1000 MG tablet Take 1,000 mg by mouth daily.  Marland Kitchen aspirin EC 81 MG tablet Take 81 mg by mouth daily.  Marland Kitchen atenolol (TENORMIN) 25 MG tablet TAKE 1 TABLET BY MOUTH DAILY  . atorvastatin (LIPITOR) 20 MG tablet Take 20 mg by mouth daily.  Marland Kitchen CARTIA XT 180 MG 24 hr capsule Take 1 tablet by mouth daily.  . Cholecalciferol (VITAMIN D) 2000 UNITS CAPS Take 1 capsule by mouth.  . diltiazem (DILACOR XR) 180 MG 24 hr capsule Take 180 mg by mouth daily.  . feeding supplement, ENSURE ENLIVE, (ENSURE ENLIVE)  LIQD Take 237 mLs by mouth 2 (two) times daily between meals.  . furosemide (LASIX) 20 MG tablet Take 20 mg by mouth daily.  Marland Kitchen gabapentin (NEURONTIN) 100 MG capsule Take 100 mg by mouth daily.  Marland Kitchen HYDROcodone-acetaminophen (NORCO/VICODIN) 5-325 MG per tablet Take 1 tablet by mouth 2 (two) times daily as needed. Take 1 tab bid or tid prn  . latanoprost (XALATAN) 0.005 % ophthalmic solution Place 1 drop into both eyes at bedtime.   . levETIRAcetam (KEPPRA) 500 MG tablet Take 1 tablet (500 mg total) by mouth every 12 (twelve) hours.  . Multiple Vitamins-Minerals (MENS MULTIVITAMIN PLUS PO) Take 1 tablet by mouth.  Marland Kitchen MYRBETRIQ 50 MG TB24 tablet Take 50 mg by mouth daily.  Marland Kitchen VASCEPA 1 G CAPS Take 1 g by mouth.  . warfarin (COUMADIN) 5 MG tablet Take 1 tablet (5 mg total) by mouth daily.   No facility-administered encounter medications on file as of 08/04/2015.

## 2015-08-05 ENCOUNTER — Telehealth: Payer: Self-pay | Admitting: *Deleted

## 2015-08-05 NOTE — Telephone Encounter (Signed)
"  We just received a letter.  A new medicine Dr. Alvy Bimler ordered, Zolendronic acid has been approved.  What is this medicine?" Explained it will be given every 12 weeks at [facility] IV for bone health.

## 2015-08-27 ENCOUNTER — Ambulatory Visit (INDEPENDENT_AMBULATORY_CARE_PROVIDER_SITE_OTHER): Payer: Commercial Managed Care - HMO | Admitting: Pharmacist Clinician (PhC)/ Clinical Pharmacy Specialist

## 2015-08-27 DIAGNOSIS — I4891 Unspecified atrial fibrillation: Secondary | ICD-10-CM

## 2015-08-27 DIAGNOSIS — I639 Cerebral infarction, unspecified: Secondary | ICD-10-CM | POA: Diagnosis not present

## 2015-08-27 DIAGNOSIS — Z7901 Long term (current) use of anticoagulants: Secondary | ICD-10-CM

## 2015-08-27 LAB — POCT INR: INR: 2.2

## 2015-09-20 ENCOUNTER — Ambulatory Visit (INDEPENDENT_AMBULATORY_CARE_PROVIDER_SITE_OTHER): Payer: Commercial Managed Care - HMO | Admitting: Cardiology

## 2015-09-20 ENCOUNTER — Ambulatory Visit (INDEPENDENT_AMBULATORY_CARE_PROVIDER_SITE_OTHER): Payer: Commercial Managed Care - HMO | Admitting: Pharmacist Clinician (PhC)/ Clinical Pharmacy Specialist

## 2015-09-20 ENCOUNTER — Encounter: Payer: Self-pay | Admitting: Cardiology

## 2015-09-20 VITALS — BP 148/72 | HR 61 | Ht 65.0 in | Wt 244.6 lb

## 2015-09-20 DIAGNOSIS — Z9861 Coronary angioplasty status: Secondary | ICD-10-CM

## 2015-09-20 DIAGNOSIS — I639 Cerebral infarction, unspecified: Secondary | ICD-10-CM

## 2015-09-20 DIAGNOSIS — I4891 Unspecified atrial fibrillation: Secondary | ICD-10-CM | POA: Diagnosis not present

## 2015-09-20 DIAGNOSIS — I480 Paroxysmal atrial fibrillation: Secondary | ICD-10-CM | POA: Diagnosis not present

## 2015-09-20 DIAGNOSIS — I10 Essential (primary) hypertension: Secondary | ICD-10-CM

## 2015-09-20 DIAGNOSIS — I2510 Atherosclerotic heart disease of native coronary artery without angina pectoris: Secondary | ICD-10-CM

## 2015-09-20 DIAGNOSIS — I519 Heart disease, unspecified: Secondary | ICD-10-CM

## 2015-09-20 DIAGNOSIS — E785 Hyperlipidemia, unspecified: Secondary | ICD-10-CM | POA: Diagnosis not present

## 2015-09-20 DIAGNOSIS — Z7901 Long term (current) use of anticoagulants: Secondary | ICD-10-CM

## 2015-09-20 DIAGNOSIS — I5189 Other ill-defined heart diseases: Secondary | ICD-10-CM

## 2015-09-20 LAB — POCT INR: INR: 2

## 2015-09-20 NOTE — Patient Instructions (Addendum)
Your physician recommends that you schedule a follow-up appointment in: 6 MONTHS with [facility]. 1 year with Dr Ellyn Hack.  Your physician recommends that you return for lab work fasting.

## 2015-09-20 NOTE — Progress Notes (Signed)
PCP: Terry Greenland, MD  Clinic Note: Chief Complaint  Patient presents with  . Follow-up  . Coronary Artery Disease  . Atrial Fibrillation    HPI: Terry Robertson. is a 82 y.o. male with a PMH below who presents today for ~1 yr f/u of PAF (h/o CVA), CAD-PCI (prior MI).   He has a history of a left hemothorax in the setting of pneumonia while on Coumadin. This is resolved and has been followed by pulmonary medicine.   He has a distant history of CAD with MI in 1997 status post PCI to circumflex and LAD.  Last cath in 2008 with mid RCA occlusion but no other stenosis. Stable since.  Diagnosed with PAF during admission for weakness and fatigue in the setting of UTI. Despite being placed on heparin immediately upon evaluation, he had a stroke current hospitalization with expressive aphasia.   He has had at least one subsequent stroke  Last visit in Jan 2017 with Terry Ransom, PA-C. Stable on warfarin. In sinus rhythm. No anginal symptoms.  Recent hospitalizations. No recent studies.  Dx with MGUS/Multiple Myeloma.   Slow to answer ?s - notalbly improved.Terry Robertson @ baseline.  Past Medical History  Diagnosis Date  . Diabetes mellitus   . Essential hypertension   . Stroke ([place]) 08/2011, 03/2012    L MCA (Afib RVR while admitted for hypotension) - expressive aphasia (almost totally recovered)  . Seizures ([place])     Post CVA; on Keppra; stable  . OSA on CPAP   . Paroxysmal atrial fibrillation (HCC)     on coumadin; Diagnosed at the time of his 1st CVA.  CHA2DS2VASC = 6. On  Warfarin  . Obesity, morbid ([place])     BMI  . ST-segment elevation myocardial infarction (STEMI) of inferior wall ([place]) 1997    inferior MI with left circumflex stent  . CAD S/P percutaneous coronary angioplasty 1997    status post remote PCI to the LAD and circumflex in 1997, heart cath in 2008 showed widely patent stents in the circumflex and LAD.  RCA had a mid lesion and then was totally occluded  distally.  . Myelodysplastic syndrome ([place])     w/mild anemia an neutropenia  . BPH (benign prostatic hyperplasia)     TURP in 12/2010  . CHF (congestive heart failure) ([place])   . Asbestosis [facility])     family unclear where he was evaluated in the past  . Aortic stenosis, mild 08/10/2014    : EF 60-65%, mod Conc LVH, cannot assess Diastolic Fxn.  Mild AS. Mild MVP without MR., Mod LA/RA dilation.  Marland Kitchen MGUS (monoclonal gammopathy of unknown significance) 09/21/2014  . Multiple myeloma ([place]) 09/29/2014    Prior Cardiac Evaluation and Past Surgical History: Past Surgical History  Procedure Laterality Date  . Transthoracic echocardiogram  08/06/11; 08/2014    a) EF 55% to 65% with grade2 DD/pseudonormal w/ mildly dilated LA with high LAP/LVEDP; b) EF 60-65%, mod Conc LVH, cannot assess Diastolic Fxn.  Mild AS. Mild MVP without MR., Mod LA/RA dilation.  . Cardiac catheterization  02/08/07    widely patent stent in the circumflex and LAD.  RCA had a mid lesion and then was totally occluded distally. Cook bare-metal 3.5x20 mm stent  . Transurethral resection of prostate  12/2010  . Nm myoview ltd  01/09/1999    Inferior wall thinning with possible mild ischemia versus infarct. This would correlate well with his known RCA occlusion   Interval History:  He presents today doing well with no complaints or cardiac standpoint. He actually seems to be a little bit more clear with his speech more fluent with less search for words. He is not had any major cardiac complaints. No resting or exertional anginal type symptoms. No heart failure symptoms of PND, orthopnea or edema. Not requiring any additional Lasix.   Cardiac ROS:  No palpitations, lightheadedness, dizziness, weakness or syncope/near syncope. No TIA/amaurosis fugax symptoms. No melena, hematochezia, hematuria, or epstaxis. No claudication.  ROS: A comprehensive was performed. Review of Systems  Constitutional: Positive for weight loss (Not eating  as much.  Cut back on sweets). Negative for fever.  Eyes: Positive for blurred vision.  Respiratory: Negative for cough and shortness of breath (Secondary to pneumonia/bronchitis).   Musculoskeletal:       Mild arthritis pains that limits his walking ability. He also has a somewhat unsteady gait  Neurological: Negative for dizziness and headaches.       No new CVA/TIA or amaurosis fugax symptoms.  Endo/Heme/Allergies: Does not bruise/bleed easily.  Psychiatric/Behavioral: Positive for memory loss. The patient is not nervous/anxious and does not have insomnia.   All other systems reviewed and are negative.   Current Outpatient Prescriptions on File Prior to Visit  Medication Sig Dispense Refill  . Ascorbic Acid (VITAMIN C) 1000 MG tablet Take 1,000 mg by mouth daily.    Marland Kitchen aspirin EC 81 MG tablet Take 81 mg by mouth daily.    Marland Kitchen atenolol (TENORMIN) 25 MG tablet TAKE 1 TABLET BY MOUTH DAILY 90 tablet 1  . atorvastatin (LIPITOR) 20 MG tablet Take 20 mg by mouth daily.    Marland Kitchen CARTIA XT 180 MG 24 hr capsule Take 1 tablet by mouth daily.    . Cholecalciferol (VITAMIN D) 2000 UNITS CAPS Take 1 capsule by mouth.    . diltiazem (DILACOR XR) 180 MG 24 hr capsule Take 180 mg by mouth daily.    . feeding supplement, ENSURE ENLIVE, (ENSURE ENLIVE) LIQD Take 237 mLs by mouth 2 (two) times daily between meals. 237 mL 12  . furosemide (LASIX) 20 MG tablet Take 20 mg by mouth daily.    Marland Kitchen gabapentin (NEURONTIN) 100 MG capsule Take 100 mg by mouth daily.    Marland Kitchen HYDROcodone-acetaminophen (NORCO/VICODIN) 5-325 MG per tablet Take 1 tablet by mouth 2 (two) times daily as needed. Take 1 tab bid or tid prn  0  . latanoprost (XALATAN) 0.005 % ophthalmic solution Place 1 drop into both eyes at bedtime.   4  . levETIRAcetam (KEPPRA) 500 MG tablet Take 1 tablet (500 mg total) by mouth every 12 (twelve) hours. 180 tablet 3  . Multiple Vitamins-Minerals (MENS MULTIVITAMIN PLUS PO) Take 1 tablet by mouth.    Marland Kitchen MYRBETRIQ 50 MG  TB24 tablet Take 50 mg by mouth daily.  3  . VASCEPA 1 G CAPS Take 1 g by mouth.  2  . warfarin (COUMADIN) 5 MG tablet Take 1 tablet (5 mg total) by mouth daily. 135 tablet 1   No current facility-administered medications on file prior to visit.   No Known Allergies   Social History  Substance Use Topics  . Smoking status: Former Smoker -- 1.00 packs/day for 10 years    Types: Cigarettes    Quit date: 03/08/1988  . Smokeless tobacco: Never Used     Comment: smoked cigars and cigarettes  . Alcohol Use: No     Family History  Problem Relation Age of Onset  .  Heart disease Mother   . Heart attack Father 83     Wt Readings from Last 3 Encounters:  09/20/15 244 lb 9.6 oz (110.95 kg)  08/04/15 244 lb (110.678 kg)  07/22/15 248 lb 3.2 oz (112.583 kg)    PHYSICAL EXAM BP 148/72 mmHg  Pulse 61  Ht '5\' 5"'  (1.651 m)  Wt 244 lb 9.6 oz (110.95 kg)  BMI 40.70 kg/m2  SpO2 95% General: Pleasant gentleman. Appears younger than his stated age. He is in no acute distress. He is alert and oriented x3. He answers questions appropriately but is a bit slow with answering some questions.Marland Kitchen He is well groomed and well nourished. H  HEENT: NCAT. EOMI. MMM. Speech is not so slurred, but remains a bit slow. Anicteric sclerae.  Neck: Supple with no LAN, JVD, or carotid bruit.  Lungs: CTAB. Nonlabored. Normal effort. Good air movement.  Heart: RRR, Distant S1, S2. Occasional ectopy; soft, early peaking C-D SEM heard at the right upper sternal border.  Abdomen: Soft/NT/ND/NABS. No HSM. Difficult to palpate due to obesity.  Extremities: No C/C/E. Pedal pulses are 1+ bilaterally and upper extremities are 2+ bilaterally.  Neuro: Grossly intact.  Speech more fluent   Adult ECG Report  Rate: 72 ;  Rhythm: normal sinus rhythm with occasional PVCs. Minimal criteria for LVH. Unusual lead positioning with Q-wave and T-wave inversion in leads II, III, aVF - not excluded. Infarct, age undetermined. Also  T-wave inversion noted in lateral leads, cannot exclude ischemia.  Narrative Interpretation: Unusual ST-T wave changes in inferior and lateral leads currently present.  Other studies Reviewed: - sees PCP this week for labs.  Lab Results  Component Value Date   CHOL 75 09/21/2014   HDL 32* 09/21/2014   LDLCALC 29 09/21/2014   TRIG 68 09/21/2014   CHOLHDL 2.3 09/21/2014    ASSESSMENT / PLAN: Problem List Items Addressed This Visit    Severe obesity (BMI >= 40) (HCC) (Chronic)    Still dropping a little bit of weight here and there. The patient understands the need to lose weight with diet and exercise. We have discussed specific strategies for this.      PAF (paroxysmal atrial fibrillation) (HCC) (Chronic)    Remains in normal sinus rhythm now. I don't know the last these had an episode of A. Fib.  This patients CHA2DS2-VASc Score and unadjusted Ischemic Stroke Rate (% per year) is equal to 9.7 % stroke rate/year from a score of 6 -- on warfarin for anticoagulation.  Above score calculated as 1 point each if present [CHF, HTN, DM, Vascular=MI/PAD/Aortic Plaque, Age if 65-74, or Male] Above score calculated as 2 points each if present [Age > 75, or Stroke/TIA/TE]       Relevant Orders   EKG 12-Lead (Completed)   Long term (current) use of anticoagulants (Chronic)   Left ventricular diastolic dysfunction, NYHA class 2 (grade 2 dysfunction with elevated filling pressures) (Chronic)    No active heart failure symptoms. May be class I. Is on beta blocker, could consider adding ACE inhibitor or ARB for afterload reduction if his pressures continued to remain elevated.      History of likely cardioembolic stroke (04/12/348) with onset of new diagnosis A. fib; expressive aphasia (Chronic)    Aphasia seems to be improving. Continue aggressive risk factor modification.      Essential hypertension (Chronic)    Pressure little elevated today, but acceptable. Prefer not to adjust  medications and this would probably require a new  medicine since his heart rate would preclude further titration of diltiazem or atenolol.      Relevant Orders   EKG 12-Lead (Completed)   Lipid panel   Comprehensive metabolic panel   Dyslipidemia, goal LDL below 70 (Chronic)    No labs since last year. Needs lipid panel checked now along with chemistries. On atorvastatin.  Will order follow-up fasting lipid panel.      Relevant Orders   Lipid panel   CAD PCI to LAD/CFX in 1997. Cath '08- medical Rx - Primary (Chronic)    No further anginal symptoms. On aspirin, beta blocker, statin and calcium channel blocker.      Relevant Orders   EKG 12-Lead (Completed)   Comprehensive metabolic panel     Current medicines are reviewed at length with the patient today. (+/- concerns) N/A The following changes have been made: N/A labs/ tests ordered today include:   Orders Placed This Encounter  Procedures  . Lipid panel  . Comprehensive metabolic panel  . EKG 12-Lead    Order Specific Question:  Where should this test be performed    Answer:  Other   No orders of the defined types were placed in this encounter.   Have routine labs checked:  Your physician wants you to follow-up in 6 months with Terry Ransom, PA and [place].  Glenetta Hew, M.D., M.S. Interventional Cardiologist   Pager # [phone]

## 2015-09-21 ENCOUNTER — Encounter: Payer: Self-pay | Admitting: Cardiology

## 2015-09-21 NOTE — Assessment & Plan Note (Signed)
Still dropping a little bit of weight here and there. The patient understands the need to lose weight with diet and exercise. We have discussed specific strategies for this.

## 2015-09-21 NOTE — Assessment & Plan Note (Signed)
Aphasia seems to be improving. Continue aggressive risk factor modification.

## 2015-09-21 NOTE — Assessment & Plan Note (Signed)
No labs since last year. Needs lipid panel checked now along with chemistries. On atorvastatin.  Will order follow-up fasting lipid panel.

## 2015-09-21 NOTE — Assessment & Plan Note (Addendum)
Remains in normal sinus rhythm now. I don't know the last these had an episode of A. Fib.  This patients CHA2DS2-VASc Score and unadjusted Ischemic Stroke Rate (% per year) is equal to 9.7 % stroke rate/year from a score of 6 -- on warfarin for anticoagulation.  Above score calculated as 1 point each if present [CHF, HTN, DM, Vascular=MI/PAD/Aortic Plaque, Age if 65-74, or Male] Above score calculated as 2 points each if present [Age > 75, or Stroke/TIA/TE]

## 2015-09-21 NOTE — Assessment & Plan Note (Signed)
No active heart failure symptoms. May be class I. Is on beta blocker, could consider adding ACE inhibitor or ARB for afterload reduction if his pressures continued to remain elevated.

## 2015-09-21 NOTE — Assessment & Plan Note (Signed)
No further anginal symptoms. On aspirin, beta blocker, statin and calcium channel blocker.

## 2015-09-21 NOTE — Assessment & Plan Note (Signed)
Pressure little elevated today, but acceptable. Prefer not to adjust medications and this would probably require a new medicine since his heart rate would preclude further titration of diltiazem or atenolol.

## 2015-10-12 ENCOUNTER — Telehealth: Payer: Self-pay | Admitting: Pulmonary Disease

## 2015-10-12 NOTE — Telephone Encounter (Signed)
Received a request for records from [facility] Dr. Delfino Lovett Puschinsky's office. I faxed 16 pages and received confirmation from the fax that 16 pages were received

## 2015-10-14 ENCOUNTER — Other Ambulatory Visit (HOSPITAL_BASED_OUTPATIENT_CLINIC_OR_DEPARTMENT_OTHER): Payer: Commercial Managed Care - HMO

## 2015-10-14 DIAGNOSIS — D469 Myelodysplastic syndrome, unspecified: Secondary | ICD-10-CM

## 2015-10-14 DIAGNOSIS — C9001 Multiple myeloma in remission: Secondary | ICD-10-CM

## 2015-10-14 DIAGNOSIS — C9000 Multiple myeloma not having achieved remission: Secondary | ICD-10-CM

## 2015-10-14 LAB — CBC WITH DIFFERENTIAL/PLATELET
BASO%: 0.8 % (ref 0.0–2.0)
Basophils Absolute: 0 10*3/uL (ref 0.0–0.1)
EOS%: 0.2 % (ref 0.0–7.0)
Eosinophils Absolute: 0 10*3/uL (ref 0.0–0.5)
HCT: 36.5 % — ABNORMAL LOW (ref 38.4–49.9)
HGB: 11.7 g/dL — ABNORMAL LOW (ref 13.0–17.1)
LYMPH%: 48 % (ref 14.0–49.0)
MCH: 26.4 pg — ABNORMAL LOW (ref 27.2–33.4)
MCHC: 32.1 g/dL (ref 32.0–36.0)
MCV: 82.2 fL (ref 79.3–98.0)
MONO#: 0.9 10*3/uL (ref 0.1–0.9)
MONO%: 30.3 % — AB (ref 0.0–14.0)
NEUT%: 20.7 % — AB (ref 39.0–75.0)
NEUTROS ABS: 0.6 10*3/uL — AB (ref 1.5–6.5)
Platelets: 58 10*3/uL — ABNORMAL LOW (ref 140–400)
RBC: 4.45 10*6/uL (ref 4.20–5.82)
RDW: 15.9 % — ABNORMAL HIGH (ref 11.0–14.6)
WBC: 2.9 10*3/uL — AB (ref 4.0–10.3)
lymph#: 1.4 10*3/uL (ref 0.9–3.3)

## 2015-10-14 LAB — BASIC METABOLIC PANEL
ANION GAP: 6 meq/L (ref 3–11)
BUN: 11.9 mg/dL (ref 7.0–26.0)
CHLORIDE: 109 meq/L (ref 98–109)
CO2: 24 mEq/L (ref 22–29)
CREATININE: 1.2 mg/dL (ref 0.7–1.3)
Calcium: 8.7 mg/dL (ref 8.4–10.4)
EGFR: 61 mL/min/{1.73_m2} — ABNORMAL LOW (ref >90)
Glucose: 130 mg/dl (ref 70–140)
Potassium: 3.6 mEq/L (ref 3.5–5.1)
SODIUM: 139 meq/L (ref 136–145)

## 2015-10-15 LAB — KAPPA/LAMBDA LIGHT CHAINS
IG KAPPA FREE LIGHT CHAIN: 125.2 mg/L — AB (ref 3.3–19.4)
Ig Lambda Free Light Chain: 77.1 mg/L — ABNORMAL HIGH (ref 5.7–26.3)
KAPPA/LAMBDA FLC RATIO: 1.62 (ref 0.26–1.65)

## 2015-10-18 ENCOUNTER — Ambulatory Visit (INDEPENDENT_AMBULATORY_CARE_PROVIDER_SITE_OTHER): Payer: Commercial Managed Care - HMO | Admitting: Pharmacist

## 2015-10-18 ENCOUNTER — Encounter (INDEPENDENT_AMBULATORY_CARE_PROVIDER_SITE_OTHER): Payer: Self-pay

## 2015-10-18 DIAGNOSIS — I639 Cerebral infarction, unspecified: Secondary | ICD-10-CM

## 2015-10-18 DIAGNOSIS — Z7901 Long term (current) use of anticoagulants: Secondary | ICD-10-CM

## 2015-10-18 DIAGNOSIS — I4891 Unspecified atrial fibrillation: Secondary | ICD-10-CM | POA: Diagnosis not present

## 2015-10-18 LAB — MULTIPLE MYELOMA PANEL, SERUM
ALBUMIN SERPL ELPH-MCNC: 3.3 g/dL (ref 2.9–4.4)
ALPHA2 GLOB SERPL ELPH-MCNC: 0.9 g/dL (ref 0.4–1.0)
Albumin/Glob SerPl: 0.5 — ABNORMAL LOW (ref 0.7–1.7)
Alpha 1: 0.2 g/dL (ref 0.0–0.4)
B-Globulin SerPl Elph-Mcnc: 0.9 g/dL (ref 0.7–1.3)
Gamma Glob SerPl Elph-Mcnc: 4.7 g/dL — ABNORMAL HIGH (ref 0.4–1.8)
Globulin, Total: 6.7 g/dL — ABNORMAL HIGH (ref 2.2–3.9)
IGM (IMMUNOGLOBIN M), SRM: 91 mg/dL (ref 15–143)
IgA, Qn, Serum: 130 mg/dL (ref 61–437)
TOTAL PROTEIN: 10 g/dL — AB (ref 6.0–8.5)

## 2015-10-18 LAB — POCT INR: INR: 1.9

## 2015-10-21 ENCOUNTER — Ambulatory Visit (HOSPITAL_BASED_OUTPATIENT_CLINIC_OR_DEPARTMENT_OTHER): Payer: Commercial Managed Care - HMO

## 2015-10-21 ENCOUNTER — Telehealth: Payer: Self-pay | Admitting: Hematology and Oncology

## 2015-10-21 ENCOUNTER — Other Ambulatory Visit: Payer: Self-pay | Admitting: Hematology and Oncology

## 2015-10-21 ENCOUNTER — Encounter: Payer: Self-pay | Admitting: Hematology and Oncology

## 2015-10-21 ENCOUNTER — Ambulatory Visit (HOSPITAL_BASED_OUTPATIENT_CLINIC_OR_DEPARTMENT_OTHER): Payer: Commercial Managed Care - HMO | Admitting: Hematology and Oncology

## 2015-10-21 VITALS — BP 156/65 | HR 68 | Temp 98.1°F | Resp 18 | Ht 65.0 in | Wt 242.1 lb

## 2015-10-21 DIAGNOSIS — D469 Myelodysplastic syndrome, unspecified: Secondary | ICD-10-CM

## 2015-10-21 DIAGNOSIS — C9000 Multiple myeloma not having achieved remission: Secondary | ICD-10-CM

## 2015-10-21 DIAGNOSIS — C9001 Multiple myeloma in remission: Secondary | ICD-10-CM

## 2015-10-21 DIAGNOSIS — D61818 Other pancytopenia: Secondary | ICD-10-CM

## 2015-10-21 MED ORDER — PREDNISONE 10 MG PO TABS: 10.0000 mg | ORAL_TABLET | Freq: Every day | ORAL | 0 refills | Status: DC

## 2015-10-21 MED ORDER — ZOLEDRONIC ACID 4 MG/100ML IV SOLN
4.0000 mg | Freq: Once | INTRAVENOUS | Status: AC
  Administered 2015-10-21: 4 mg via INTRAVENOUS
  Filled 2015-10-21: qty 100

## 2015-10-21 MED ORDER — SODIUM CHLORIDE 0.9 % IV SOLN
Freq: Once | INTRAVENOUS | Status: AC
  Administered 2015-10-21: 11:00:00 via INTRAVENOUS

## 2015-10-21 NOTE — Assessment & Plan Note (Signed)
Since discontinuation of Revlimid, the patient remain mildly pancytopenic. I am concerned about neutropenia again. I recommend low-dose prednisone, 10 mg daily and reassess in 2 weeks He is not symptomatic with infection. He does not need prophylactic antibiotics of G-CSF

## 2015-10-21 NOTE — Patient Instructions (Signed)

## 2015-10-21 NOTE — Progress Notes (Signed)
[place] OFFICE PROGRESS NOTE  Patient Care Team: [name], MD as PCP - General (Internal Medicine) Heath Lark, MD as Consulting Physician (Hematology and Oncology)  SUMMARY OF ONCOLOGIC HISTORY:   Myelodysplastic syndrome ([place])   08/05/2011 Initial Diagnosis    Myelodysplastic syndrome     09/21/2014 Bone Marrow Biopsy    Accession: [id number] showed myelofibrosis and 15% plasma cells     10/15/2014 - 07/23/2015 Chemotherapy    He started taking Revlimid weekly along with dexamethasone weekly      INTERVAL HISTORY: Please see below for problem oriented charting. Returns for follow-up. He denies recent infection. No new bone pain. He has dental clearance from dentist recently  REVIEW OF SYSTEMS:   Constitutional: Denies fevers, chills or abnormal weight loss Eyes: Denies blurriness of vision Ears, nose, mouth, throat, and face: Denies mucositis or sore throat Respiratory: Denies cough, dyspnea or wheezes Cardiovascular: Denies palpitation, chest discomfort or lower extremity swelling Gastrointestinal:  Denies nausea, heartburn or change in bowel habits Skin: Denies abnormal skin rashes Lymphatics: Denies new lymphadenopathy or easy bruising Neurological:Denies numbness, tingling or new weaknesses Behavioral/Psych: Mood is stable, no new changes  All other systems were reviewed with the patient and are negative.  I have reviewed the past medical history, past surgical history, social history and family history with the patient and they are unchanged from previous note.  ALLERGIES:  has No Known Allergies.  MEDICATIONS:  Current Outpatient Prescriptions  Medication Sig Dispense Refill  . Ascorbic Acid (VITAMIN C) 1000 MG tablet Take 1,000 mg by mouth daily.    Marland Kitchen aspirin EC 81 MG tablet Take 81 mg by mouth daily.    Marland Kitchen atenolol (TENORMIN) 25 MG tablet TAKE 1 TABLET BY MOUTH DAILY 90 tablet 1  . atorvastatin (LIPITOR) 20 MG tablet Take 20 mg by mouth  daily.    Marland Kitchen CARTIA XT 180 MG 24 hr capsule Take 1 tablet by mouth daily.    . Cholecalciferol (VITAMIN D) 2000 UNITS CAPS Take 1 capsule by mouth.    . diltiazem (DILACOR XR) 180 MG 24 hr capsule Take 180 mg by mouth daily.    . feeding supplement, ENSURE ENLIVE, (ENSURE ENLIVE) LIQD Take 237 mLs by mouth 2 (two) times daily between meals. 237 mL 12  . furosemide (LASIX) 20 MG tablet Take 20 mg by mouth daily.    Marland Kitchen gabapentin (NEURONTIN) 100 MG capsule Take 100 mg by mouth daily.    Marland Kitchen HYDROcodone-acetaminophen (NORCO/VICODIN) 5-325 MG per tablet Take 1 tablet by mouth 2 (two) times daily as needed. Take 1 tab bid or tid prn  0  . latanoprost (XALATAN) 0.005 % ophthalmic solution Place 1 drop into both eyes at bedtime.   4  . levETIRAcetam (KEPPRA) 500 MG tablet Take 1 tablet (500 mg total) by mouth every 12 (twelve) hours. 180 tablet 3  . Multiple Vitamins-Minerals (MENS MULTIVITAMIN PLUS PO) Take 1 tablet by mouth.    Marland Kitchen MYRBETRIQ 50 MG TB24 tablet Take 50 mg by mouth daily.  3  . VASCEPA 1 G CAPS Take 1 g by mouth.  2  . warfarin (COUMADIN) 5 MG tablet Take 1 tablet (5 mg total) by mouth daily. 135 tablet 1  . predniSONE (DELTASONE) 10 MG tablet Take 1 tablet (10 mg total) by mouth daily with breakfast. 30 tablet 0   No current facility-administered medications for this visit.    Facility-Administered Medications Ordered in Other Visits  Medication Dose Route Frequency Provider Last  Rate Last Dose  . Zoledronic Acid (ZOMETA) 4 mg IVPB  4 mg Intravenous Once Heath Lark, MD 400 mL/hr at 10/21/15 1057 4 mg at 10/21/15 1057    PHYSICAL EXAMINATION: ECOG PERFORMANCE STATUS: 0 - Asymptomatic  Vitals:   10/21/15 0935  BP: (!) 156/65  Pulse: 68  Resp: 18  Temp: 98.1 F (36.7 C)   Filed Weights   10/21/15 0935  Weight: 242 lb 1.6 oz (109.8 kg)    GENERAL:alert, no distress and comfortable SKIN: skin color, texture, turgor are normal, no rashes or significant lesions EYES: normal,  Conjunctiva are pink and non-injected, sclera clear Musculoskeletal:no cyanosis of digits and no clubbing  NEURO: alert & oriented x 3 with fluent speech, no focal motor/sensory deficits  LABORATORY DATA:  I have reviewed the data as listed    Component Value Date/Time   NA 139 10/14/2015 0915   K 3.6 10/14/2015 0915   CL 105 09/21/2014 1127   CL 109 (H) 01/02/2012 1526   CO2 24 10/14/2015 0915   GLUCOSE 130 10/14/2015 0915   GLUCOSE 118 (H) 01/02/2012 1526   BUN 11.9 10/14/2015 0915   CREATININE 1.2 10/14/2015 0915   CALCIUM 8.7 10/14/2015 0915   PROT 10.0 (H) 10/14/2015 0915   PROT 10.4 (H) 07/15/2015 0854   ALBUMIN 3.1 (L) 07/15/2015 0854   AST 31 07/15/2015 0854   ALT 20 07/15/2015 0854   ALKPHOS 55 07/15/2015 0854   BILITOT 0.56 07/15/2015 0854   GFRNONAA >60 08/14/2014 0735   GFRAA >60 08/14/2014 0735    No results found for: SPEP, UPEP  Lab Results  Component Value Date   WBC 2.9 (L) 10/14/2015   NEUTROABS 0.6 (L) 10/14/2015   HGB 11.7 (L) 10/14/2015   HCT 36.5 (L) 10/14/2015   MCV 82.2 10/14/2015   PLT 58 (L) 10/14/2015      Chemistry      Component Value Date/Time   NA 139 10/14/2015 0915   K 3.6 10/14/2015 0915   CL 105 09/21/2014 1127   CL 109 (H) 01/02/2012 1526   CO2 24 10/14/2015 0915   BUN 11.9 10/14/2015 0915   CREATININE 1.2 10/14/2015 0915      Component Value Date/Time   CALCIUM 8.7 10/14/2015 0915   ALKPHOS 55 07/15/2015 0854   AST 31 07/15/2015 0854   ALT 20 07/15/2015 0854   BILITOT 0.56 07/15/2015 0854     adiological images as listed and agreed with the findings in the report. No results found.   ASSESSMENT & PLAN:  Myelodysplastic syndrome ([place]) Since discontinuation of Revlimid, the patient remain mildly pancytopenic. I am concerned about neutropenia again. I recommend low-dose prednisone, 10 mg daily and reassess in 2 weeks He is not symptomatic with infection. He does not need prophylactic antibiotics of G-CSF  Multiple  myeloma Recent myeloma panel show undetectable M spike. Serum light chains has improved. The significant elevated IgG was polyclonal. As above, we will discontinue treatment for multiple myeloma and observe. He has received dental clearance recently. I will proceed with Zometa today  Pancytopenia, acquired ([place]) This is related to underlying bone marrow disorder He is not symptomatic. He does not require further workup or transfusion.  We have discontinued Revlimid. I will start him on low-dose steroids as above   No orders of the defined types were placed in this encounter.  All questions were answered. The patient knows to call the clinic with any problems, questions or concerns. No barriers to learning was detected.  I spent 15 minutes counseling the patient face to face. The total time spent in the appointment was 20 minutes and more than 50% was on counseling and review of test results     [facility], [place], MD 10/21/2015 11:02 AM

## 2015-10-21 NOTE — Telephone Encounter (Signed)
Gave relative avs report and appointments for August.  °

## 2015-10-21 NOTE — Assessment & Plan Note (Signed)
This is related to underlying bone marrow disorder He is not symptomatic. He does not require further workup or transfusion.  We have discontinued Revlimid. I will start him on low-dose steroids as above

## 2015-10-21 NOTE — Assessment & Plan Note (Signed)
Recent myeloma panel show undetectable M spike. Serum light chains has improved. The significant elevated IgG was polyclonal. As above, we will discontinue treatment for multiple myeloma and observe. He has received dental clearance recently. I will proceed with Zometa today

## 2015-11-04 ENCOUNTER — Ambulatory Visit (HOSPITAL_BASED_OUTPATIENT_CLINIC_OR_DEPARTMENT_OTHER): Payer: Commercial Managed Care - HMO | Admitting: Hematology and Oncology

## 2015-11-04 ENCOUNTER — Telehealth: Payer: Self-pay | Admitting: Hematology and Oncology

## 2015-11-04 ENCOUNTER — Other Ambulatory Visit (HOSPITAL_BASED_OUTPATIENT_CLINIC_OR_DEPARTMENT_OTHER): Payer: Commercial Managed Care - HMO

## 2015-11-04 DIAGNOSIS — D696 Thrombocytopenia, unspecified: Secondary | ICD-10-CM | POA: Diagnosis not present

## 2015-11-04 DIAGNOSIS — I10 Essential (primary) hypertension: Secondary | ICD-10-CM

## 2015-11-04 DIAGNOSIS — D469 Myelodysplastic syndrome, unspecified: Secondary | ICD-10-CM

## 2015-11-04 LAB — CBC WITH DIFFERENTIAL/PLATELET
BASO%: 0 % (ref 0.0–2.0)
BASOS ABS: 0 10*3/uL (ref 0.0–0.1)
EOS%: 0 % (ref 0.0–7.0)
Eosinophils Absolute: 0 10*3/uL (ref 0.0–0.5)
HCT: 40.6 % (ref 38.4–49.9)
HGB: 12.7 g/dL — ABNORMAL LOW (ref 13.0–17.1)
LYMPH%: 29.7 % (ref 14.0–49.0)
MCH: 26.7 pg — ABNORMAL LOW (ref 27.2–33.4)
MCHC: 31.3 g/dL — ABNORMAL LOW (ref 32.0–36.0)
MCV: 85.3 fL (ref 79.3–98.0)
MONO#: 1.2 10*3/uL — ABNORMAL HIGH (ref 0.1–0.9)
MONO%: 25.1 % — AB (ref 0.0–14.0)
NEUT%: 45.2 % (ref 39.0–75.0)
NEUTROS ABS: 2.1 10*3/uL (ref 1.5–6.5)
NRBC: 0 % (ref 0–0)
Platelets: 102 10*3/uL — ABNORMAL LOW (ref 140–400)
RBC: 4.76 10*6/uL (ref 4.20–5.82)
RDW: 15.7 % — AB (ref 11.0–14.6)
WBC: 4.7 10*3/uL (ref 4.0–10.3)
lymph#: 1.4 10*3/uL (ref 0.9–3.3)

## 2015-11-04 NOTE — Progress Notes (Signed)
[place] OFFICE PROGRESS NOTE  Patient Care Team: [name], MD as PCP - General (Internal Medicine) Heath Lark, MD as Consulting Physician (Hematology and Oncology)  SUMMARY OF ONCOLOGIC HISTORY:   Myelodysplastic syndrome ([place])   08/05/2011 Initial Diagnosis    Myelodysplastic syndrome      09/21/2014 Bone Marrow Biopsy    Accession: [id number] showed myelofibrosis and 15% plasma cells      10/15/2014 - 07/23/2015 Chemotherapy    He started taking Revlimid weekly along with dexamethasone weekly       INTERVAL HISTORY: Please see below for problem oriented charting. He returns for follow-up. He tolerated prednisone well. Denies recent infection. The patient denies any recent signs or symptoms of bleeding such as spontaneous epistaxis, hematuria or hematochezia.   REVIEW OF SYSTEMS:   Constitutional: Denies fevers, chills or abnormal weight loss Eyes: Denies blurriness of vision Ears, nose, mouth, throat, and face: Denies mucositis or sore throat Respiratory: Denies cough, dyspnea or wheezes Cardiovascular: Denies palpitation, chest discomfort or lower extremity swelling Gastrointestinal:  Denies nausea, heartburn or change in bowel habits Skin: Denies abnormal skin rashes Lymphatics: Denies new lymphadenopathy or easy bruising Neurological:Denies numbness, tingling or new weaknesses Behavioral/Psych: Mood is stable, no new changes  All other systems were reviewed with the patient and are negative.  I have reviewed the past medical history, past surgical history, social history and family history with the patient and they are unchanged from previous note.  ALLERGIES:  has No Known Allergies.  MEDICATIONS:  Current Outpatient Prescriptions  Medication Sig Dispense Refill  . Ascorbic Acid (VITAMIN C) 1000 MG tablet Take 1,000 mg by mouth daily.    Marland Kitchen aspirin EC 81 MG tablet Take 81 mg by mouth daily.    Marland Kitchen atenolol (TENORMIN) 25 MG tablet TAKE 1  TABLET BY MOUTH DAILY 90 tablet 1  . atorvastatin (LIPITOR) 20 MG tablet Take 20 mg by mouth daily.    Marland Kitchen CARTIA XT 180 MG 24 hr capsule Take 1 tablet by mouth daily.    . Cholecalciferol (VITAMIN D) 2000 UNITS CAPS Take 1 capsule by mouth.    . diltiazem (DILACOR XR) 180 MG 24 hr capsule Take 180 mg by mouth daily.    . feeding supplement, ENSURE ENLIVE, (ENSURE ENLIVE) LIQD Take 237 mLs by mouth 2 (two) times daily between meals. 237 mL 12  . furosemide (LASIX) 20 MG tablet Take 20 mg by mouth daily.    Marland Kitchen gabapentin (NEURONTIN) 100 MG capsule Take 100 mg by mouth daily.    Marland Kitchen HYDROcodone-acetaminophen (NORCO/VICODIN) 5-325 MG per tablet Take 1 tablet by mouth 2 (two) times daily as needed. Take 1 tab bid or tid prn  0  . latanoprost (XALATAN) 0.005 % ophthalmic solution Place 1 drop into both eyes at bedtime.   4  . levETIRAcetam (KEPPRA) 500 MG tablet Take 1 tablet (500 mg total) by mouth every 12 (twelve) hours. 180 tablet 3  . Multiple Vitamins-Minerals (MENS MULTIVITAMIN PLUS PO) Take 1 tablet by mouth.    Marland Kitchen MYRBETRIQ 50 MG TB24 tablet Take 50 mg by mouth daily.  3  . predniSONE (DELTASONE) 10 MG tablet Take 1 tablet (10 mg total) by mouth daily with breakfast. 30 tablet 0  . VASCEPA 1 G CAPS Take 1 g by mouth.  2  . warfarin (COUMADIN) 5 MG tablet Take 1 tablet (5 mg total) by mouth daily. 135 tablet 1   No current facility-administered medications for this visit.  PHYSICAL EXAMINATION: ECOG PERFORMANCE STATUS: 1 - Symptomatic but completely ambulatory  Vitals:   11/04/15 1142  BP: (!) 174/70  Pulse: 70  Resp: 17  Temp: 98.2 F (36.8 C)   Filed Weights   11/04/15 1142  Weight: 240 lb 11.2 oz (109.2 kg)    GENERAL:alert, no distress and comfortable SKIN: skin color, texture, turgor are normal, no rashes or significant lesions EYES: normal, Conjunctiva are pink and non-injected, sclera clear OROPHARYNX:no exudate, no erythema and lips, buccal mucosa, and tongue normal   NECK: supple, thyroid normal size, non-tender, without nodularity LYMPH:  no palpable lymphadenopathy in the cervical, axillary or inguinal LUNGS: clear to auscultation and percussion with normal breathing effort HEART: regular rate & rhythm and no murmurs and no lower extremity edema ABDOMEN:abdomen soft, non-tender and normal bowel sounds Musculoskeletal:no cyanosis of digits and no clubbing  NEURO: alert & oriented x 3 with fluent speech, no focal motor/sensory deficits  LABORATORY DATA:  I have reviewed the data as listed    Component Value Date/Time   NA 139 10/14/2015 0915   K 3.6 10/14/2015 0915   CL 105 09/21/2014 1127   CL 109 (H) 01/02/2012 1526   CO2 24 10/14/2015 0915   GLUCOSE 130 10/14/2015 0915   GLUCOSE 118 (H) 01/02/2012 1526   BUN 11.9 10/14/2015 0915   CREATININE 1.2 10/14/2015 0915   CALCIUM 8.7 10/14/2015 0915   PROT 10.0 (H) 10/14/2015 0915   PROT 10.4 (H) 07/15/2015 0854   ALBUMIN 3.1 (L) 07/15/2015 0854   AST 31 07/15/2015 0854   ALT 20 07/15/2015 0854   ALKPHOS 55 07/15/2015 0854   BILITOT 0.56 07/15/2015 0854   GFRNONAA >60 08/14/2014 0735   GFRAA >60 08/14/2014 0735    No results found for: SPEP, UPEP  Lab Results  Component Value Date   WBC 4.7 11/04/2015   NEUTROABS 2.1 11/04/2015   HGB 12.7 (L) 11/04/2015   HCT 40.6 11/04/2015   MCV 85.3 11/04/2015   PLT 102 (L) 11/04/2015      Chemistry      Component Value Date/Time   NA 139 10/14/2015 0915   K 3.6 10/14/2015 0915   CL 105 09/21/2014 1127   CL 109 (H) 01/02/2012 1526   CO2 24 10/14/2015 0915   BUN 11.9 10/14/2015 0915   CREATININE 1.2 10/14/2015 0915      Component Value Date/Time   CALCIUM 8.7 10/14/2015 0915   ALKPHOS 55 07/15/2015 0854   AST 31 07/15/2015 0854   ALT 20 07/15/2015 0854   BILITOT 0.56 07/15/2015 0854     ASSESSMENT & PLAN:  Myelodysplastic syndrome ([facility]) Since discontinuation of Revlimid, the patient remain mildly pancytopenic. I am concerned about  neutropenia again. I recommend low-dose prednisone, 10 mg daily  He is responding very well to Rx I recommend reducing dose to 5 mg daily and reassess in 3 weeks  Thrombocytopenia ([place]) This is related to underlying bone marrow disorder with myelodysplastic syndrome. With low-dose prednisone, he responded beautifully with doubling of his platelet count within 2 weeks. As mentioned above, I plan to reduce the dose of prednisone and reassess in 3 weeks. There is no contraindication to remain on aspirin/anticoagulation therapy as long as the platelet is greater than 50,000.    Essential hypertension His blood pressure is high likely due to fluid retention from prednisone Hopefully, once I taper the dose down, his blood pressure will be back to normal.   No orders of the defined types were placed  in this encounter.  All questions were answered. The patient knows to call the clinic with any problems, questions or concerns. No barriers to learning was detected. I spent 15 minutes counseling the patient face to face. The total time spent in the appointment was 20 minutes and more than 50% was on counseling and review of test results     [facility], [place], MD 11/04/2015 12:04 PM

## 2015-11-04 NOTE — Assessment & Plan Note (Signed)
This is related to underlying bone marrow disorder with myelodysplastic syndrome. With low-dose prednisone, he responded beautifully with doubling of his platelet count within 2 weeks. As mentioned above, I plan to reduce the dose of prednisone and reassess in 3 weeks. There is no contraindication to remain on aspirin/anticoagulation therapy as long as the platelet is greater than 50,000.

## 2015-11-04 NOTE — Assessment & Plan Note (Signed)
His blood pressure is high likely due to fluid retention from prednisone Hopefully, once I taper the dose down, his blood pressure will be back to normal.

## 2015-11-04 NOTE — Assessment & Plan Note (Signed)
Since discontinuation of Revlimid, the patient remain mildly pancytopenic. I am concerned about neutropenia again. I recommend low-dose prednisone, 10 mg daily  He is responding very well to Rx I recommend reducing dose to 5 mg daily and reassess in 3 weeks

## 2015-11-04 NOTE — Telephone Encounter (Signed)
GAVE PATIENT AVS REPORT AND APPOINTMENTS FOR September.  °

## 2015-11-09 ENCOUNTER — Ambulatory Visit (INDEPENDENT_AMBULATORY_CARE_PROVIDER_SITE_OTHER): Payer: Commercial Managed Care - HMO | Admitting: Pharmacist Clinician (PhC)/ Clinical Pharmacy Specialist

## 2015-11-09 DIAGNOSIS — I4891 Unspecified atrial fibrillation: Secondary | ICD-10-CM | POA: Diagnosis not present

## 2015-11-09 DIAGNOSIS — Z7901 Long term (current) use of anticoagulants: Secondary | ICD-10-CM | POA: Diagnosis not present

## 2015-11-09 DIAGNOSIS — I639 Cerebral infarction, unspecified: Secondary | ICD-10-CM

## 2015-11-09 LAB — POCT INR: INR: 1.9

## 2015-11-22 ENCOUNTER — Encounter: Payer: Self-pay | Admitting: Hematology and Oncology

## 2015-11-22 ENCOUNTER — Telehealth: Payer: Self-pay | Admitting: Hematology and Oncology

## 2015-11-22 ENCOUNTER — Ambulatory Visit (HOSPITAL_BASED_OUTPATIENT_CLINIC_OR_DEPARTMENT_OTHER): Payer: Commercial Managed Care - HMO | Admitting: Hematology and Oncology

## 2015-11-22 ENCOUNTER — Other Ambulatory Visit (HOSPITAL_BASED_OUTPATIENT_CLINIC_OR_DEPARTMENT_OTHER): Payer: Commercial Managed Care - HMO

## 2015-11-22 VITALS — BP 175/76 | HR 72 | Temp 98.2°F | Resp 18 | Ht 65.0 in | Wt 240.0 lb

## 2015-11-22 DIAGNOSIS — C9001 Multiple myeloma in remission: Secondary | ICD-10-CM | POA: Diagnosis not present

## 2015-11-22 DIAGNOSIS — D469 Myelodysplastic syndrome, unspecified: Secondary | ICD-10-CM

## 2015-11-22 DIAGNOSIS — Z23 Encounter for immunization: Secondary | ICD-10-CM

## 2015-11-22 DIAGNOSIS — D61818 Other pancytopenia: Secondary | ICD-10-CM | POA: Diagnosis not present

## 2015-11-22 DIAGNOSIS — I10 Essential (primary) hypertension: Secondary | ICD-10-CM

## 2015-11-22 LAB — CBC WITH DIFFERENTIAL/PLATELET
BASO%: 0 % (ref 0.0–2.0)
BASOS ABS: 0 10*3/uL (ref 0.0–0.1)
EOS ABS: 0 10*3/uL (ref 0.0–0.5)
EOS%: 0 % (ref 0.0–7.0)
HCT: 39.8 % (ref 38.4–49.9)
HEMOGLOBIN: 12.6 g/dL — AB (ref 13.0–17.1)
LYMPH%: 40.1 % (ref 14.0–49.0)
MCH: 27.1 pg — AB (ref 27.2–33.4)
MCHC: 31.7 g/dL — ABNORMAL LOW (ref 32.0–36.0)
MCV: 85.6 fL (ref 79.3–98.0)
MONO#: 1.2 10*3/uL — ABNORMAL HIGH (ref 0.1–0.9)
MONO%: 42.2 % — AB (ref 0.0–14.0)
NEUT#: 0.5 10*3/uL — CL (ref 1.5–6.5)
NEUT%: 17.7 % — AB (ref 39.0–75.0)
RBC: 4.65 10*6/uL (ref 4.20–5.82)
RDW: 15.7 % — AB (ref 11.0–14.6)
WBC: 2.9 10*3/uL — ABNORMAL LOW (ref 4.0–10.3)
lymph#: 1.2 10*3/uL (ref 0.9–3.3)
nRBC: 0 % (ref 0–0)

## 2015-11-22 MED ORDER — PREDNISONE 2.5 MG PO TABS: 7.5000 mg | ORAL_TABLET | Freq: Every day | ORAL | 3 refills | Status: DC

## 2015-11-22 MED ORDER — INFLUENZA VAC SPLIT QUAD 0.5 ML IM SUSY
0.5000 mL | PREFILLED_SYRINGE | Freq: Once | INTRAMUSCULAR | Status: AC
  Administered 2015-11-22: 0.5 mL via INTRAMUSCULAR
  Filled 2015-11-22: qty 0.5

## 2015-11-22 NOTE — Assessment & Plan Note (Signed)
Since discontinuation of Revlimid, the patient remain mildly pancytopenic. I am concerned about neutropenia again. I recommend low-dose prednisone, 10 mg daily  He is responding very well to Rx I recommend reducing dose to 5 mg daily but unfortunately, he developed recurrent neutropenia. The patient would like to try 7.5 mg daily. I discussed with him the risk, benefit, side effects of long-term prednisone therapy and he agreed to proceed. I will see him back next month for further assessment

## 2015-11-22 NOTE — Assessment & Plan Note (Signed)
His blood pressure is high likely due to fluid retention from prednisone Hopefully, once I taper the dose down, his blood pressure will be back to normal.

## 2015-11-22 NOTE — Assessment & Plan Note (Signed)
This is related to underlying bone marrow disorder He is not symptomatic. He does not require further workup or transfusion.  We have discontinued Revlimid. I will continue him on low-dose steroids as above

## 2015-11-22 NOTE — Progress Notes (Signed)
[place] OFFICE PROGRESS NOTE  Patient Care Team: [name], MD as PCP - General (Internal Medicine) Heath Lark, MD as Consulting Physician (Hematology and Oncology)  SUMMARY OF ONCOLOGIC HISTORY:   Myelodysplastic syndrome ([place])   08/05/2011 Initial Diagnosis    Myelodysplastic syndrome      09/21/2014 Bone Marrow Biopsy    Accession: [id number] showed myelofibrosis and 15% plasma cells      10/15/2014 - 07/23/2015 Chemotherapy    He started taking Revlimid weekly along with dexamethasone weekly       INTERVAL HISTORY: Please see below for problem oriented charting. He returns for further follow-up. He tolerated reduced dose prednisone without any side effects. Denies recent infection. The patient denies any recent signs or symptoms of bleeding such as spontaneous epistaxis, hematuria or hematochezia.   REVIEW OF SYSTEMS:   Constitutional: Denies fevers, chills or abnormal weight loss Eyes: Denies blurriness of vision Ears, nose, mouth, throat, and face: Denies mucositis or sore throat Respiratory: Denies cough, dyspnea or wheezes Cardiovascular: Denies palpitation, chest discomfort or lower extremity swelling Gastrointestinal:  Denies nausea, heartburn or change in bowel habits Skin: Denies abnormal skin rashes Lymphatics: Denies new lymphadenopathy or easy bruising Neurological:Denies numbness, tingling or new weaknesses Behavioral/Psych: Mood is stable, no new changes  All other systems were reviewed with the patient and are negative.  I have reviewed the past medical history, past surgical history, social history and family history with the patient and they are unchanged from previous note.  ALLERGIES:  has No Known Allergies.  MEDICATIONS:  Current Outpatient Prescriptions  Medication Sig Dispense Refill  . Ascorbic Acid (VITAMIN C) 1000 MG tablet Take 1,000 mg by mouth daily.    Marland Kitchen aspirin EC 81 MG tablet Take 81 mg by mouth daily.    Marland Kitchen  atenolol (TENORMIN) 25 MG tablet TAKE 1 TABLET BY MOUTH DAILY 90 tablet 1  . atorvastatin (LIPITOR) 20 MG tablet Take 20 mg by mouth daily.    Marland Kitchen CARTIA XT 180 MG 24 hr capsule Take 1 tablet by mouth daily.    . Cholecalciferol (VITAMIN D) 2000 UNITS CAPS Take 1 capsule by mouth.    . diltiazem (DILACOR XR) 180 MG 24 hr capsule Take 180 mg by mouth daily.    . feeding supplement, ENSURE ENLIVE, (ENSURE ENLIVE) LIQD Take 237 mLs by mouth 2 (two) times daily between meals. 237 mL 12  . furosemide (LASIX) 20 MG tablet Take 20 mg by mouth daily.    Marland Kitchen gabapentin (NEURONTIN) 100 MG capsule Take 100 mg by mouth daily.    Marland Kitchen HYDROcodone-acetaminophen (NORCO/VICODIN) 5-325 MG per tablet Take 1 tablet by mouth 2 (two) times daily as needed. Take 1 tab bid or tid prn  0  . latanoprost (XALATAN) 0.005 % ophthalmic solution Place 1 drop into both eyes at bedtime.   4  . levETIRAcetam (KEPPRA) 500 MG tablet Take 1 tablet (500 mg total) by mouth every 12 (twelve) hours. 180 tablet 3  . Multiple Vitamins-Minerals (MENS MULTIVITAMIN PLUS PO) Take 1 tablet by mouth.    Marland Kitchen MYRBETRIQ 50 MG TB24 tablet Take 50 mg by mouth daily.  3  . predniSONE (DELTASONE) 10 MG tablet Take 1 tablet (10 mg total) by mouth daily with breakfast. 30 tablet 0  . VASCEPA 1 G CAPS Take 1 g by mouth.  2  . warfarin (COUMADIN) 5 MG tablet Take 1 tablet (5 mg total) by mouth daily. 135 tablet 1  . predniSONE (DELTASONE) 2.5  MG tablet Take 3 tablets (7.5 mg total) by mouth daily with breakfast. 90 tablet 3   No current facility-administered medications for this visit.     PHYSICAL EXAMINATION: ECOG PERFORMANCE STATUS: 0 - Asymptomatic  Vitals:   11/22/15 1019  BP: (!) 175/76  Pulse: 72  Resp: 18  Temp: 98.2 F (36.8 C)   Filed Weights   11/22/15 1019  Weight: 240 lb (108.9 kg)    GENERAL:alert, no distress and comfortable SKIN: skin color, texture, turgor are normal, no rashes or significant lesions EYES: normal, Conjunctiva  are pink and non-injected, sclera clear Musculoskeletal:no cyanosis of digits and no clubbing  NEURO: alert & oriented x 3 with fluent speech, no focal motor/sensory deficits  LABORATORY DATA:  I have reviewed the data as listed    Component Value Date/Time   NA 139 10/14/2015 0915   K 3.6 10/14/2015 0915   CL 105 09/21/2014 1127   CL 109 (H) 01/02/2012 1526   CO2 24 10/14/2015 0915   GLUCOSE 130 10/14/2015 0915   GLUCOSE 118 (H) 01/02/2012 1526   BUN 11.9 10/14/2015 0915   CREATININE 1.2 10/14/2015 0915   CALCIUM 8.7 10/14/2015 0915   PROT 10.0 (H) 10/14/2015 0915   PROT 10.4 (H) 07/15/2015 0854   ALBUMIN 3.1 (L) 07/15/2015 0854   AST 31 07/15/2015 0854   ALT 20 07/15/2015 0854   ALKPHOS 55 07/15/2015 0854   BILITOT 0.56 07/15/2015 0854   GFRNONAA >60 08/14/2014 0735   GFRAA >60 08/14/2014 0735    No results found for: SPEP, UPEP  Lab Results  Component Value Date   WBC 2.9 (L) 11/22/2015   NEUTROABS 0.5 (LL) 11/22/2015   HGB 12.6 (L) 11/22/2015   HCT 39.8 11/22/2015   MCV 85.6 11/22/2015   PLT 63 Large & giant platelets (L) 11/22/2015      Chemistry      Component Value Date/Time   NA 139 10/14/2015 0915   K 3.6 10/14/2015 0915   CL 105 09/21/2014 1127   CL 109 (H) 01/02/2012 1526   CO2 24 10/14/2015 0915   BUN 11.9 10/14/2015 0915   CREATININE 1.2 10/14/2015 0915      Component Value Date/Time   CALCIUM 8.7 10/14/2015 0915   ALKPHOS 55 07/15/2015 0854   AST 31 07/15/2015 0854   ALT 20 07/15/2015 0854   BILITOT 0.56 07/15/2015 0854      ASSESSMENT & PLAN:  Myelodysplastic syndrome ([place]) Since discontinuation of Revlimid, the patient remain mildly pancytopenic. I am concerned about neutropenia again. I recommend low-dose prednisone, 10 mg daily  He is responding very well to Rx I recommend reducing dose to 5 mg daily but unfortunately, he developed recurrent neutropenia. The patient would like to try 7.5 mg daily. I discussed with him the risk,  benefit, side effects of long-term prednisone therapy and he agreed to proceed. I will see him back next month for further assessment  Pancytopenia, acquired [facility]) This is related to underlying bone marrow disorder He is not symptomatic. He does not require further workup or transfusion.  We have discontinued Revlimid. I will continue him on low-dose steroids as above  Multiple myeloma Recent myeloma panel show undetectable M spike. Serum light chains has improved. The significant elevated IgG was polyclonal. As above, we will discontinue treatment for multiple myeloma and observe. He has received dental clearance recently.   Essential hypertension His blood pressure is high likely due to fluid retention from prednisone Hopefully, once I taper the  dose down, his blood pressure will be back to normal.   No orders of the defined types were placed in this encounter.  All questions were answered. The patient knows to call the clinic with any problems, questions or concerns. No barriers to learning was detected. I spent 15 minutes counseling the patient face to face. The total time spent in the appointment was 20 minutes and more than 50% was on counseling and review of test results     [facility], [place], MD 11/22/2015 10:46 AM

## 2015-11-22 NOTE — Assessment & Plan Note (Signed)
Recent myeloma panel show undetectable M spike. Serum light chains has improved. The significant elevated IgG was polyclonal. As above, we will discontinue treatment for multiple myeloma and observe. He has received dental clearance recently.

## 2015-11-22 NOTE — Telephone Encounter (Signed)
Avs report and appointment schedule given to patient, per 11/22/15 los. °

## 2015-11-26 ENCOUNTER — Other Ambulatory Visit: Payer: Self-pay | Admitting: *Deleted

## 2015-11-26 MED ORDER — PREDNISONE 10 MG PO TABS: 10.0000 mg | ORAL_TABLET | Freq: Every day | ORAL | 0 refills | Status: DC

## 2015-12-01 ENCOUNTER — Ambulatory Visit (INDEPENDENT_AMBULATORY_CARE_PROVIDER_SITE_OTHER): Payer: Commercial Managed Care - HMO | Admitting: Pharmacist Clinician (PhC)/ Clinical Pharmacy Specialist

## 2015-12-01 DIAGNOSIS — Z7901 Long term (current) use of anticoagulants: Secondary | ICD-10-CM | POA: Diagnosis not present

## 2015-12-01 DIAGNOSIS — I4891 Unspecified atrial fibrillation: Secondary | ICD-10-CM | POA: Diagnosis not present

## 2015-12-01 DIAGNOSIS — I639 Cerebral infarction, unspecified: Secondary | ICD-10-CM | POA: Diagnosis not present

## 2015-12-01 LAB — POCT INR: INR: 2.4

## 2015-12-01 MED ORDER — WARFARIN SODIUM 5 MG PO TABS: ORAL_TABLET | ORAL | 1 refills | Status: DC

## 2015-12-20 ENCOUNTER — Ambulatory Visit (HOSPITAL_BASED_OUTPATIENT_CLINIC_OR_DEPARTMENT_OTHER): Payer: Commercial Managed Care - HMO | Admitting: Hematology and Oncology

## 2015-12-20 ENCOUNTER — Other Ambulatory Visit (HOSPITAL_BASED_OUTPATIENT_CLINIC_OR_DEPARTMENT_OTHER): Payer: Commercial Managed Care - HMO

## 2015-12-20 ENCOUNTER — Encounter: Payer: Self-pay | Admitting: Hematology and Oncology

## 2015-12-20 ENCOUNTER — Telehealth: Payer: Self-pay | Admitting: Hematology and Oncology

## 2015-12-20 DIAGNOSIS — D469 Myelodysplastic syndrome, unspecified: Secondary | ICD-10-CM

## 2015-12-20 DIAGNOSIS — D61818 Other pancytopenia: Secondary | ICD-10-CM

## 2015-12-20 DIAGNOSIS — I10 Essential (primary) hypertension: Secondary | ICD-10-CM | POA: Diagnosis not present

## 2015-12-20 LAB — CBC WITH DIFFERENTIAL/PLATELET
BASO%: 0 % (ref 0.0–2.0)
Basophils Absolute: 0 10*3/uL (ref 0.0–0.1)
EOS%: 0 % (ref 0.0–7.0)
Eosinophils Absolute: 0 10*3/uL (ref 0.0–0.5)
HEMATOCRIT: 40.9 % (ref 38.4–49.9)
HGB: 13 g/dL (ref 13.0–17.1)
LYMPH%: 43.2 % (ref 14.0–49.0)
MCH: 27.4 pg (ref 27.2–33.4)
MCHC: 31.8 g/dL — AB (ref 32.0–36.0)
MCV: 86.1 fL (ref 79.3–98.0)
MONO#: 0.8 10*3/uL (ref 0.1–0.9)
MONO%: 22.8 % — ABNORMAL HIGH (ref 0.0–14.0)
NEUT#: 1.1 10*3/uL — ABNORMAL LOW (ref 1.5–6.5)
NEUT%: 34 % — AB (ref 39.0–75.0)
PLATELETS: 51 10*3/uL — AB (ref 140–400)
RBC: 4.75 10*6/uL (ref 4.20–5.82)
RDW: 16 % — ABNORMAL HIGH (ref 11.0–14.6)
WBC: 3.3 10*3/uL — ABNORMAL LOW (ref 4.0–10.3)
lymph#: 1.4 10*3/uL (ref 0.9–3.3)
nRBC: 0 % (ref 0–0)

## 2015-12-20 NOTE — Assessment & Plan Note (Signed)
His blood pressure is high likely due to fluid retention from prednisone Hopefully, once I taper the dose down, his blood pressure will be back to normal. I recommend he checks his blood pressure more frequently at home and if his blood pressure remained high, to contact his primary care doctor for medication changes

## 2015-12-20 NOTE — Assessment & Plan Note (Signed)
Since discontinuation of Revlimid, the patient remain mildly pancytopenic. Previously while on prednisone 10 mg daily, he responded well to Rx After I reduced the dose to 5 mg daily, he developed recurrent neutropenia. The patient would like to try 7.5 mg daily. Neutropenia improves while on that dose. I recommend he take 7.5 mg daily from Mondays to Fridays and then to take 5 mg on the weekend and reassess in one month. I would like to try to get his [place] greater than 1 and platelet count greater than 50,000 with this strategy

## 2015-12-20 NOTE — Progress Notes (Signed)
[facility] OFFICE PROGRESS NOTE  Patient Care Team: [name], MD as PCP - General (Internal Medicine) Heath Lark, MD as Consulting Physician (Hematology and Oncology)  SUMMARY OF ONCOLOGIC HISTORY:   Myelodysplastic syndrome ([place])   08/05/2011 Initial Diagnosis    Myelodysplastic syndrome      09/21/2014 Bone Marrow Biopsy    Accession: [id number] showed myelofibrosis and 15% plasma cells      10/15/2014 - 07/23/2015 Chemotherapy    He started taking Revlimid weekly along with dexamethasone weekly       INTERVAL HISTORY: Please see below for problem oriented charting. He is seen for further follow-up. He tolerated high-dose prednisone well. He denies leg swelling. His blood pressures were high today but he denies symptoms. Denies recent infection. The patient denies any recent signs or symptoms of bleeding such as spontaneous epistaxis, hematuria or hematochezia.   REVIEW OF SYSTEMS:   Constitutional: Denies fevers, chills or abnormal weight loss Eyes: Denies blurriness of vision Ears, nose, mouth, throat, and face: Denies mucositis or sore throat Respiratory: Denies cough, dyspnea or wheezes Cardiovascular: Denies palpitation, chest discomfort or lower extremity swelling Gastrointestinal:  Denies nausea, heartburn or change in bowel habits Skin: Denies abnormal skin rashes Lymphatics: Denies new lymphadenopathy or easy bruising Neurological:Denies numbness, tingling or new weaknesses Behavioral/Psych: Mood is stable, no new changes  All other systems were reviewed with the patient and are negative.  I have reviewed the past medical history, past surgical history, social history and family history with the patient and they are unchanged from previous note.  ALLERGIES:  has No Known Allergies.  MEDICATIONS:  Current Outpatient Prescriptions  Medication Sig Dispense Refill  . Ascorbic Acid (VITAMIN C) 1000 MG tablet Take 1,000 mg by mouth daily.     Marland Kitchen aspirin EC 81 MG tablet Take 81 mg by mouth daily.    Marland Kitchen atenolol (TENORMIN) 25 MG tablet TAKE 1 TABLET BY MOUTH DAILY 90 tablet 1  . atorvastatin (LIPITOR) 20 MG tablet Take 20 mg by mouth daily.    Marland Kitchen CARTIA XT 180 MG 24 hr capsule Take 1 tablet by mouth daily.    . Cholecalciferol (VITAMIN D) 2000 UNITS CAPS Take 1 capsule by mouth.    . diltiazem (DILACOR XR) 180 MG 24 hr capsule Take 180 mg by mouth daily.    . feeding supplement, ENSURE ENLIVE, (ENSURE ENLIVE) LIQD Take 237 mLs by mouth 2 (two) times daily between meals. 237 mL 12  . furosemide (LASIX) 20 MG tablet Take 20 mg by mouth daily.    Marland Kitchen gabapentin (NEURONTIN) 100 MG capsule Take 100 mg by mouth daily.    Marland Kitchen HYDROcodone-acetaminophen (NORCO/VICODIN) 5-325 MG per tablet Take 1 tablet by mouth 2 (two) times daily as needed. Take 1 tab bid or tid prn  0  . latanoprost (XALATAN) 0.005 % ophthalmic solution Place 1 drop into both eyes at bedtime.   4  . levETIRAcetam (KEPPRA) 500 MG tablet Take 1 tablet (500 mg total) by mouth every 12 (twelve) hours. 180 tablet 3  . Multiple Vitamins-Minerals (MENS MULTIVITAMIN PLUS PO) Take 1 tablet by mouth.    Marland Kitchen MYRBETRIQ 50 MG TB24 tablet Take 50 mg by mouth daily.  3  . VASCEPA 1 G CAPS Take 1 g by mouth.  2  . warfarin (COUMADIN) 5 MG tablet Take 1-1.5 tablets by mouth daily as directed by coumadin clinic 135 tablet 1  . predniSONE (DELTASONE) 10 MG tablet Take 1 tablet (10 mg total)  by mouth daily with breakfast. 30 tablet 0   No current facility-administered medications for this visit.     PHYSICAL EXAMINATION: ECOG PERFORMANCE STATUS: 2 - Symptomatic, <50% confined to bed  Vitals:   12/20/15 0900  BP: (!) 194/78  Pulse: 75  Resp: 18  Temp: 97.9 F (36.6 C)   Filed Weights   12/20/15 0900  Weight: 244 lb (110.7 kg)    GENERAL:alert, no distress and comfortable. He is morbidly obese SKIN: skin color, texture, turgor are normal, no rashes or significant lesions EYES: normal,  Conjunctiva are pink and non-injected, sclera clear Musculoskeletal:no cyanosis of digits and no clubbing  NEURO: alert & oriented x 3 with fluent speech, no focal motor/sensory deficits  LABORATORY DATA:  I have reviewed the data as listed    Component Value Date/Time   NA 139 10/14/2015 0915   K 3.6 10/14/2015 0915   CL 105 09/21/2014 1127   CL 109 (H) 01/02/2012 1526   CO2 24 10/14/2015 0915   GLUCOSE 130 10/14/2015 0915   GLUCOSE 118 (H) 01/02/2012 1526   BUN 11.9 10/14/2015 0915   CREATININE 1.2 10/14/2015 0915   CALCIUM 8.7 10/14/2015 0915   PROT 10.0 (H) 10/14/2015 0915   PROT 10.4 (H) 07/15/2015 0854   ALBUMIN 3.1 (L) 07/15/2015 0854   AST 31 07/15/2015 0854   ALT 20 07/15/2015 0854   ALKPHOS 55 07/15/2015 0854   BILITOT 0.56 07/15/2015 0854   GFRNONAA >60 08/14/2014 0735   GFRAA >60 08/14/2014 0735    No results found for: SPEP, UPEP  Lab Results  Component Value Date   WBC 3.3 (L) 12/20/2015   NEUTROABS 1.1 (L) 12/20/2015   HGB 13.0 12/20/2015   HCT 40.9 12/20/2015   MCV 86.1 12/20/2015   PLT 51 (L) 12/20/2015      Chemistry      Component Value Date/Time   NA 139 10/14/2015 0915   K 3.6 10/14/2015 0915   CL 105 09/21/2014 1127   CL 109 (H) 01/02/2012 1526   CO2 24 10/14/2015 0915   BUN 11.9 10/14/2015 0915   CREATININE 1.2 10/14/2015 0915      Component Value Date/Time   CALCIUM 8.7 10/14/2015 0915   ALKPHOS 55 07/15/2015 0854   AST 31 07/15/2015 0854   ALT 20 07/15/2015 0854   BILITOT 0.56 07/15/2015 0854     ASSESSMENT & PLAN:  Myelodysplastic syndrome ([place]) Since discontinuation of Revlimid, the patient remain mildly pancytopenic. Previously while on prednisone 10 mg daily, he responded well to Rx After I reduced the dose to 5 mg daily, he developed recurrent neutropenia. The patient would like to try 7.5 mg daily. Neutropenia improves while on that dose. I recommend he take 7.5 mg daily from Mondays to Fridays and then to take 5 mg on  the weekend and reassess in one month. I would like to try to get his [place] greater than 1 and platelet count greater than 50,000 with this strategy  Pancytopenia, acquired [facility]) This is related to underlying bone marrow disorder He is not symptomatic. He does not require further workup or transfusion.  We have discontinued Revlimid. I will continue him on low-dose steroids as above There is no contraindication to remain on aspirin or anticoagulation therapy as long as the platelet is greater than 50,000.    Essential hypertension His blood pressure is high likely due to fluid retention from prednisone Hopefully, once I taper the dose down, his blood pressure will be back to  normal. I recommend he checks his blood pressure more frequently at home and if his blood pressure remained high, to contact his primary care doctor for medication changes   No orders of the defined types were placed in this encounter.  All questions were answered. The patient knows to call the clinic with any problems, questions or concerns. No barriers to learning was detected. I spent 15 minutes counseling the patient face to face. The total time spent in the appointment was 20 minutes and more than 50% was on counseling and review of test results     Heath Lark, MD 12/20/2015 9:19 AM

## 2015-12-20 NOTE — Assessment & Plan Note (Signed)
This is related to underlying bone marrow disorder He is not symptomatic. He does not require further workup or transfusion.  We have discontinued Revlimid. I will continue him on low-dose steroids as above There is no contraindication to remain on aspirin or anticoagulation therapy as long as the platelet is greater than 50,000.

## 2015-12-20 NOTE — Telephone Encounter (Signed)
Avs report and appointment schedule given to patient, per 12/20/15 los. °

## 2015-12-29 ENCOUNTER — Ambulatory Visit (INDEPENDENT_AMBULATORY_CARE_PROVIDER_SITE_OTHER): Payer: Commercial Managed Care - HMO | Admitting: Podiatry

## 2015-12-29 ENCOUNTER — Ambulatory Visit (INDEPENDENT_AMBULATORY_CARE_PROVIDER_SITE_OTHER): Payer: Commercial Managed Care - HMO | Admitting: Pharmacist

## 2015-12-29 ENCOUNTER — Encounter: Payer: Self-pay | Admitting: Podiatry

## 2015-12-29 DIAGNOSIS — I639 Cerebral infarction, unspecified: Secondary | ICD-10-CM

## 2015-12-29 DIAGNOSIS — Z7901 Long term (current) use of anticoagulants: Secondary | ICD-10-CM

## 2015-12-29 DIAGNOSIS — M79676 Pain in unspecified toe(s): Secondary | ICD-10-CM | POA: Diagnosis not present

## 2015-12-29 DIAGNOSIS — B351 Tinea unguium: Secondary | ICD-10-CM

## 2015-12-29 DIAGNOSIS — I4891 Unspecified atrial fibrillation: Secondary | ICD-10-CM | POA: Diagnosis not present

## 2015-12-29 LAB — POCT INR: INR: 3.9

## 2015-12-29 NOTE — Progress Notes (Signed)
Patient ID: Terry [place]., male   DOB: 1928-08-16, 82 y.o.   MRN: [id number]    Subjective: This patient presents for scheduled visit again complaining of elongated and thickened toenails which are cough walking wearing shoes and request toenail debridement. Patient's son present in treatment room  Objective: Orientated 3 DP and PT pulses 2/4 bilaterally Capillary reflex immediate bilaterally Plantar scaling left HAV left The toenails are hypertrophic, elongated, incurvated, discolored and tender to direct palpation 6-10  Assessment: Symptomatic onychomycoses 6-10  Plan: Debrided toenails 6-10 mechanically and electronically without any bleeding  Reappoint 3 months

## 2015-12-31 ENCOUNTER — Telehealth (INDEPENDENT_AMBULATORY_CARE_PROVIDER_SITE_OTHER): Payer: Self-pay | Admitting: Radiology

## 2015-12-31 NOTE — Telephone Encounter (Signed)
Mr debord needs a refill on the Hydrocodone 5/325.Please call pt when it is ready for pick up.

## 2016-01-03 MED ORDER — HYDROCODONE-ACETAMINOPHEN 5-325 MG PO TABS: 1.0000 | ORAL_TABLET | Freq: Two times a day (BID) | ORAL | 0 refills | Status: DC | PRN

## 2016-01-04 NOTE — Telephone Encounter (Signed)
Patient is aware that his rx is ready for pick up at the front desk, I advised that he needs to get from PCP, patient states that he understands.

## 2016-01-16 ENCOUNTER — Emergency Department (HOSPITAL_COMMUNITY): Payer: Commercial Managed Care - HMO

## 2016-01-16 ENCOUNTER — Emergency Department (HOSPITAL_COMMUNITY)
Admission: EM | Admit: 2016-01-16 | Discharge: 2016-01-16 | Disposition: A | Discharge status: Home / Self Care | Payer: Commercial Managed Care - HMO | Attending: Emergency Medicine | Admitting: Emergency Medicine

## 2016-01-16 ENCOUNTER — Encounter (HOSPITAL_COMMUNITY): Payer: Self-pay

## 2016-01-16 DIAGNOSIS — Z7982 Long term (current) use of aspirin: Secondary | ICD-10-CM | POA: Diagnosis not present

## 2016-01-16 DIAGNOSIS — E119 Type 2 diabetes mellitus without complications: Secondary | ICD-10-CM | POA: Diagnosis not present

## 2016-01-16 DIAGNOSIS — Z87891 Personal history of nicotine dependence: Secondary | ICD-10-CM | POA: Insufficient documentation

## 2016-01-16 DIAGNOSIS — J988 Other specified respiratory disorders: Secondary | ICD-10-CM | POA: Diagnosis not present

## 2016-01-16 DIAGNOSIS — Z8673 Personal history of transient ischemic attack (TIA), and cerebral infarction without residual deficits: Secondary | ICD-10-CM | POA: Diagnosis not present

## 2016-01-16 DIAGNOSIS — I2510 Atherosclerotic heart disease of native coronary artery without angina pectoris: Secondary | ICD-10-CM | POA: Insufficient documentation

## 2016-01-16 DIAGNOSIS — R509 Fever, unspecified: Secondary | ICD-10-CM | POA: Diagnosis present

## 2016-01-16 DIAGNOSIS — I509 Heart failure, unspecified: Secondary | ICD-10-CM | POA: Diagnosis not present

## 2016-01-16 DIAGNOSIS — B9789 Other viral agents as the cause of diseases classified elsewhere: Secondary | ICD-10-CM

## 2016-01-16 DIAGNOSIS — I252 Old myocardial infarction: Secondary | ICD-10-CM | POA: Insufficient documentation

## 2016-01-16 DIAGNOSIS — J069 Acute upper respiratory infection, unspecified: Secondary | ICD-10-CM

## 2016-01-16 DIAGNOSIS — Z7901 Long term (current) use of anticoagulants: Secondary | ICD-10-CM | POA: Insufficient documentation

## 2016-01-16 DIAGNOSIS — I110 Hypertensive heart disease with heart failure: Secondary | ICD-10-CM | POA: Insufficient documentation

## 2016-01-16 LAB — CBC WITH DIFFERENTIAL/PLATELET
BASOS ABS: 0 10*3/uL (ref 0.0–0.1)
BASOS PCT: 0 %
EOS ABS: 0 10*3/uL (ref 0.0–0.7)
Eosinophils Relative: 0 %
HCT: 39.3 % (ref 39.0–52.0)
Hemoglobin: 12.5 g/dL — ABNORMAL LOW (ref 13.0–17.0)
Lymphocytes Relative: 21 %
Lymphs Abs: 1.4 10*3/uL (ref 0.7–4.0)
MCH: 28 pg (ref 26.0–34.0)
MCHC: 31.8 g/dL (ref 30.0–36.0)
MCV: 87.9 fL (ref 78.0–100.0)
MONO ABS: 1.3 10*3/uL — AB (ref 0.1–1.0)
Monocytes Relative: 19 %
NEUTROS PCT: 60 %
Neutro Abs: 4.2 10*3/uL (ref 1.7–7.7)
PLATELETS: 55 10*3/uL — AB (ref 150–400)
RBC: 4.47 MIL/uL (ref 4.22–5.81)
RDW: 16.3 % — ABNORMAL HIGH (ref 11.5–15.5)
WBC: 6.9 10*3/uL (ref 4.0–10.5)

## 2016-01-16 LAB — COMPREHENSIVE METABOLIC PANEL
ALK PHOS: 51 U/L (ref 38–126)
ALT: 35 U/L (ref 17–63)
ANION GAP: 7 (ref 5–15)
AST: 35 U/L (ref 15–41)
Albumin: 2.9 g/dL — ABNORMAL LOW (ref 3.5–5.0)
BILIRUBIN TOTAL: 0.5 mg/dL (ref 0.3–1.2)
BUN: 10 mg/dL (ref 6–20)
CALCIUM: 8.4 mg/dL — AB (ref 8.9–10.3)
CO2: 23 mmol/L (ref 22–32)
Chloride: 107 mmol/L (ref 101–111)
Creatinine, Ser: 1.07 mg/dL (ref 0.61–1.24)
GLUCOSE: 130 mg/dL — AB (ref 65–99)
Potassium: 3.7 mmol/L (ref 3.5–5.1)
Sodium: 137 mmol/L (ref 135–145)
TOTAL PROTEIN: 9 g/dL — AB (ref 6.5–8.1)

## 2016-01-16 LAB — URINALYSIS, ROUTINE W REFLEX MICROSCOPIC
BILIRUBIN URINE: NEGATIVE
Glucose, UA: NEGATIVE mg/dL
Ketones, ur: NEGATIVE mg/dL
Leukocytes, UA: NEGATIVE
Nitrite: NEGATIVE
Specific Gravity, Urine: 1.018 (ref 1.005–1.030)
pH: 7 (ref 5.0–8.0)

## 2016-01-16 LAB — URINE MICROSCOPIC-ADD ON

## 2016-01-16 LAB — I-STAT CG4 LACTIC ACID, ED: LACTIC ACID, VENOUS: 1.22 mmol/L (ref 0.5–1.9)

## 2016-01-16 NOTE — ED Notes (Signed)
Pt and family state they understand instructions. All questions answered. Pt home stable via wc.

## 2016-01-16 NOTE — ED Notes (Signed)
Delay in lab draw,  PA examining pt.

## 2016-01-16 NOTE — ED Provider Notes (Signed)
[facility] DEPT Provider Note   CSN: [id number] Arrival date & time: 01/16/16  1344     History   Chief Complaint Chief Complaint  Patient presents with  . Cough  . fever, HTN    HPI Terry Robertson. is a 82 y.o. male who presents with generalized malaise, cough, and a fever. PMH significant for Myelodysplastic syndrome with pancytopenia on chronic steroids, aortic stenosis, CAD, CHF (EF 55-65% in 2013), DM, obesity, PAF on coumadin, hx of CVA. Family at bedside state that for the past 2 days he has had a dry cough with a subjective fever and diaphoresis. They state he has been staying in the bed today and incidentally they checked his blood pressure and it was 160s. He has received his flu vaccination this year. He has been taking all of his medicines and tolerating well. Reports chronic back pain which has not worsened. He has been eating and drinking normally. Denies chills, URI symptoms, chest pain, SOB, wheezing, abdominal pain, N/V, dysuria.  HPI  Past Medical History:  Diagnosis Date  . Aortic stenosis, mild 08/10/2014   : EF 60-65%, mod Conc LVH, cannot assess Diastolic Fxn.  Mild AS. Mild MVP without MR., Mod LA/RA dilation.  . Asbestosis [facility])    family unclear where he was evaluated in the past  . BPH (benign prostatic hyperplasia)    TURP in 12/2010  . CAD S/P percutaneous coronary angioplasty 1997   status post remote PCI to the LAD and circumflex in 1997, heart cath in 2008 showed widely patent stents in the circumflex and LAD.  RCA had a mid lesion and then was totally occluded distally.  . CHF (congestive heart failure) ([place])   . Diabetes mellitus   . Essential hypertension   . MGUS (monoclonal gammopathy of unknown significance) 09/21/2014  . Multiple myeloma ([place]) 09/29/2014  . Myelodysplastic syndrome ([facility])    w/mild anemia an neutropenia  . Obesity, morbid ([place])    BMI  . OSA on CPAP   . Paroxysmal atrial fibrillation ([facility])    on coumadin; Diagnosed at  the time of his 1st CVA.  CHA2DS2VASC = 6. On  Warfarin  . Seizures ([place])    Post CVA; on Keppra; stable  . ST-segment elevation myocardial infarction (STEMI) of inferior wall ([place]) 1997   inferior MI with left circumflex stent  . Stroke ([place]) 08/2011, 03/2012   L MCA (Afib RVR while admitted for hypotension) - expressive aphasia (almost totally recovered)    Patient Active Problem List   Diagnosis Date Noted  . Thrombocytopenia ([place]) 11/04/2015  . Poor dentition 07/23/2015  . Multiple myeloma in remission ([place]) 07/22/2015  . Pancytopenia ([place]) 12/29/2014  . Multiple myeloma ([place]) 09/29/2014  . MGUS (monoclonal gammopathy of unknown significance) 09/21/2014  . Chronic anticoagulation   . Microcytic anemia 08/09/2014  . Left ventricular diastolic dysfunction, NYHA class 2 (grade 2 dysfunction with elevated filling pressures) 10/01/2013  . Hemothorax, left 06/25/2013  . Severe obesity (BMI >= 40) ([place]) 03/09/2013  . PAF (paroxysmal atrial fibrillation) ([place]) 03/09/2013  . Long term (current) use of anticoagulants 05/21/2012  . CVA (cerebral infarction) 03/28/2012  . History of likely cardioembolic stroke (10/05/5001) with onset of new diagnosis A. fib; expressive aphasia 08/09/2011  . Diastolic dysfunction: Grade 2 08/07/2011  . Pancytopenia, acquired ([place]) 08/07/2011  . CAD PCI to LAD/CFX in 1997. Cath '08- medical Rx 08/05/2011  . Morbid obesity ([place]) 08/05/2011  . Myelodysplastic syndrome ([place]) 08/05/2011  . Dyslipidemia, goal LDL  below 70 08/05/2011  . Essential hypertension 08/05/2011  . S/P TURP Oct 2012 08/05/2011  . DM type 2 (diabetes mellitus, type 2) ([place]) 08/05/2011    Past Surgical History:  Procedure Laterality Date  . CARDIAC CATHETERIZATION  02/08/07   widely patent stent in the circumflex and LAD.  RCA had a mid lesion and then was totally occluded distally. Cook bare-metal 3.5x20 mm stent  . NM MYOVIEW LTD  01/09/1999   Inferior wall thinning with possible mild  ischemia versus infarct. This would correlate well with his known RCA occlusion  . TRANSTHORACIC ECHOCARDIOGRAM  08/06/11; 08/2014   a) EF 55% to 65% with grade2 DD/pseudonormal w/ mildly dilated LA with high LAP/LVEDP; b) EF 60-65%, mod Conc LVH, cannot assess Diastolic Fxn.  Mild AS. Mild MVP without MR., Mod LA/RA dilation.  . TRANSURETHRAL RESECTION OF PROSTATE  12/2010       Home Medications    Prior to Admission medications   Medication Sig Start Date End Date Taking? Authorizing Provider  Ascorbic Acid (VITAMIN C) 1000 MG tablet Take 1,000 mg by mouth daily.    Historical Provider, MD  aspirin EC 81 MG tablet Take 81 mg by mouth daily.    Historical Provider, MD  atenolol (TENORMIN) 25 MG tablet TAKE 1 TABLET BY MOUTH DAILY 05/28/15   Leonie Man, MD  atorvastatin (LIPITOR) 20 MG tablet Take 20 mg by mouth daily. 08/11/13   Historical Provider, MD  CARTIA XT 180 MG 24 hr capsule Take 1 tablet by mouth daily. 04/13/15   Historical Provider, MD  Cholecalciferol (VITAMIN D) 2000 UNITS CAPS Take 1 capsule by mouth.    Historical Provider, MD  diltiazem (DILACOR XR) 180 MG 24 hr capsule Take 180 mg by mouth daily.    Historical Provider, MD  feeding supplement, ENSURE ENLIVE, (ENSURE ENLIVE) LIQD Take 237 mLs by mouth 2 (two) times daily between meals. 08/14/14   Florencia Reasons, MD  furosemide (LASIX) 20 MG tablet Take 20 mg by mouth daily. 05/05/14   Historical Provider, MD  gabapentin (NEURONTIN) 100 MG capsule Take 100 mg by mouth daily. 11/23/13   Historical Provider, MD  HYDROcodone-acetaminophen (NORCO/VICODIN) 5-325 MG tablet Take 1 tablet by mouth 2 (two) times daily as needed. Take 1 tab bid or tid prn 01/03/16   Jessy Oto, MD  latanoprost (XALATAN) 0.005 % ophthalmic solution Place 1 drop into both eyes at bedtime.  02/17/14   Historical Provider, MD  levETIRAcetam (KEPPRA) 500 MG tablet Take 1 tablet (500 mg total) by mouth every 12 (twelve) hours. 01/18/15   Dennie Bible, NP    Multiple Vitamins-Minerals (MENS MULTIVITAMIN PLUS PO) Take 1 tablet by mouth.    Historical Provider, MD  MYRBETRIQ 50 MG TB24 tablet Take 50 mg by mouth daily. 07/06/14   Historical Provider, MD  predniSONE (DELTASONE) 10 MG tablet Take 1 tablet (10 mg total) by mouth daily with breakfast. 11/26/15   Heath Lark, MD  VASCEPA 1 G CAPS Take 1 g by mouth. 07/29/14   Historical Provider, MD  warfarin (COUMADIN) 5 MG tablet Take 1-1.5 tablets by mouth daily as directed by coumadin clinic 12/01/15   Leonie Man, MD    Family History Family History  Problem Relation Age of Onset  . Heart disease Mother   . Heart attack Father 100    Social History Social History  Substance Use Topics  . Smoking status: Former Smoker    Packs/day: 1.00    Years:  10.00    Types: Cigarettes    Quit date: 03/08/1988  . Smokeless tobacco: Never Used     Comment: smoked cigars and cigarettes  . Alcohol use No     Allergies   Patient has no known allergies.   Review of Systems Review of Systems  Constitutional: Positive for activity change, diaphoresis and fever. Negative for appetite change and chills.  HENT: Negative for congestion, ear pain, rhinorrhea, sore throat and trouble swallowing.   Respiratory: Positive for cough. Negative for shortness of breath.   Cardiovascular: Negative for chest pain.  Gastrointestinal: Negative for abdominal pain, nausea and vomiting.  All other systems reviewed and are negative.    Physical Exam Updated Vital Signs BP 161/90 (BP Location: Right Arm)   Pulse 104   Temp 99.8 F (37.7 C) (Oral)   Resp 18   Ht '5\' 5"'  (1.651 m)   Wt 108.9 kg   SpO2 95%   BMI 39.94 kg/m   Physical Exam  Constitutional: He is oriented to person, place, and time. He appears well-developed and well-nourished. No distress.  Obese, mildly diaphoretic  HENT:  Head: Normocephalic and atraumatic.  Right Ear: Tympanic membrane, external ear and ear canal normal. Decreased hearing is  noted.  Left Ear: Decreased hearing is noted.  Nose: Nose normal.  Mouth/Throat: Uvula is midline, oropharynx is clear and moist and mucous membranes are normal.  Eyes: Conjunctivae are normal. Pupils are equal, round, and reactive to light. Right eye exhibits no discharge. Left eye exhibits no discharge. No scleral icterus.  Neck: Normal range of motion.  Cardiovascular: Normal rate and regular rhythm.  Exam reveals no gallop and no friction rub.   Murmur heard. Pulmonary/Chest: Effort normal. No respiratory distress. He has decreased breath sounds. He has no wheezes. He has no rales. He exhibits no tenderness.  Abdominal: Soft. Bowel sounds are normal. He exhibits no distension and no mass. There is no tenderness. There is no rebound and no guarding.  Neurological: He is alert and oriented to person, place, and time.  Skin: Skin is warm and dry.  Psychiatric: He has a normal mood and affect. His behavior is normal.  Nursing note and vitals reviewed.    ED Treatments / Results  Labs (all labs ordered are listed, but only abnormal results are displayed) Labs Reviewed  COMPREHENSIVE METABOLIC PANEL - Abnormal; Notable for the following:       Result Value   Glucose, Bld 130 (*)    Calcium 8.4 (*)    Total Protein 9.0 (*)    Albumin 2.9 (*)    All other components within normal limits  CBC WITH DIFFERENTIAL/PLATELET - Abnormal; Notable for the following:    Hemoglobin 12.5 (*)    RDW 16.3 (*)    Platelets 55 (*)    Monocytes Absolute 1.3 (*)    All other components within normal limits  URINALYSIS, ROUTINE W REFLEX MICROSCOPIC (NOT AT [facility]) - Abnormal; Notable for the following:    Hgb urine dipstick MODERATE (*)    Protein, ur >300 (*)    All other components within normal limits  URINE MICROSCOPIC-ADD ON - Abnormal; Notable for the following:    Squamous Epithelial / LPF 0-5 (*)    Bacteria, UA RARE (*)    Casts HYALINE CASTS (*)    All other components within normal limits    I-STAT CG4 LACTIC ACID, ED    EKG  EKG Interpretation None       Radiology  Dg Chest 2 View  Result Date: 01/16/2016 CLINICAL DATA:  Nonproductive cough for 3 days EXAM: CHEST  2 VIEW COMPARISON:  December 18, 2014 and Aug 04, 2015 FINDINGS: The loculated left pleural effusion is unchanged since October of 2016. No pneumothorax. Stable cardiomegaly. The hila and mediastinum are unchanged. No other interval changes. IMPRESSION: Stable loculated left-sided pleural effusion. No other interval change. Electronically Signed   By: Dorise Bullion III M.D   On: 01/16/2016 15:20    Procedures Procedures (including critical care time)  Medications Ordered in ED Medications - No data to display   Initial Impression / Assessment and Plan / ED Course  I have reviewed the triage vital signs and the nursing notes.  Pertinent labs & imaging results that were available during my care of the patient were reviewed by me and considered in my medical decision making (see chart for details).  Clinical Course    82 year old male presents with what is most likely a viral illness. Temp is elevated but no fever. He is mildly hypertensive - provided reassurance to family. He is not tachycardic or hypoxic. Exam is unrevealing. He is in NAD and non-toxic appearing. CBC unremarkable. CMP overall unremarkable. Lactic acid is normal. UA is abnormal but no changes from prior samples. Shared visit with Dr. Rogene Houston. Discussed results with patient and family. Advised supportive care and return if symptoms are worsening. Patient is NAD, non-toxic, with stable VS. Patient is informed of clinical course, understands medical decision making process, and agrees with plan. Opportunity for questions provided and all questions answered. Return precautions given.   Final Clinical Impressions(s) / ED Diagnoses   Final diagnoses:  Viral URI with cough    New Prescriptions New Prescriptions   No medications on file      Recardo Evangelist, PA-C 01/16/16 [place], MD 01/17/16 1700

## 2016-01-16 NOTE — ED Notes (Signed)
Pt states he has not taken his medicine for his chronic back pain.

## 2016-01-16 NOTE — ED Provider Notes (Signed)
Medical screening examination/treatment/procedure(s) were conducted as a shared visit with non-physician practitioner(s) and myself.  I personally evaluated the patient during the encounter.   EKG Interpretation None       Patient seen by me along with the physician assistant. Patient with onset on Thursday evening of congestion cough not feeling well. Patient denies any pain. Patient does not have much energy. No documented fevers. Patient's past medical history is significant for myelodysplastic syndrome. As well as history of some congestive heart failure diabetes hypertension. Also history of atrial fibrillation and is on the blood thinner warfarin. Symptoms seem to fit a viral type illness. Workup shows no changes in the chest x-ray labs without any significant abnormalities. Urinalysis is still pending if negative patient the certainly can be discharged home and follow-up with primary care doctor.  On exam patient's alert heart regular rate and rhythm lungs are clear bilaterally. Abdomen soft nontender. No significant swelling in the lower extremities. Patient appears nontoxic no acute distress. Oxygen saturations are ranging from 91% to 95% on room air. Slight increase in his blood pressure compared to baseline but no significant hypertensive changes noted today.   Fredia Sorrow, MD 01/16/16 1535

## 2016-01-16 NOTE — ED Triage Notes (Signed)
Patient complains of congestion, cough and not feeling well x 1 day. Patient alert and oriented. Denies pain. Son states that patient has been laying in bed most of the day. NAD

## 2016-01-16 NOTE — Discharge Instructions (Signed)
You can take over the counter cough and cold medicines for your symptoms Please follow up with your family doctor.

## 2016-01-17 LAB — PATHOLOGIST SMEAR REVIEW

## 2016-01-19 ENCOUNTER — Ambulatory Visit (INDEPENDENT_AMBULATORY_CARE_PROVIDER_SITE_OTHER): Payer: Commercial Managed Care - HMO | Admitting: Pharmacist

## 2016-01-19 DIAGNOSIS — I4891 Unspecified atrial fibrillation: Secondary | ICD-10-CM

## 2016-01-19 DIAGNOSIS — Z7901 Long term (current) use of anticoagulants: Secondary | ICD-10-CM

## 2016-01-19 DIAGNOSIS — I639 Cerebral infarction, unspecified: Secondary | ICD-10-CM

## 2016-01-19 LAB — POCT INR: INR: 2.1

## 2016-01-21 ENCOUNTER — Ambulatory Visit (HOSPITAL_BASED_OUTPATIENT_CLINIC_OR_DEPARTMENT_OTHER): Payer: Commercial Managed Care - HMO | Admitting: Hematology and Oncology

## 2016-01-21 ENCOUNTER — Telehealth: Payer: Self-pay | Admitting: Hematology and Oncology

## 2016-01-21 ENCOUNTER — Encounter: Payer: Self-pay | Admitting: Hematology and Oncology

## 2016-01-21 ENCOUNTER — Other Ambulatory Visit (HOSPITAL_BASED_OUTPATIENT_CLINIC_OR_DEPARTMENT_OTHER): Payer: Commercial Managed Care - HMO

## 2016-01-21 DIAGNOSIS — D469 Myelodysplastic syndrome, unspecified: Secondary | ICD-10-CM

## 2016-01-21 DIAGNOSIS — I10 Essential (primary) hypertension: Secondary | ICD-10-CM | POA: Diagnosis not present

## 2016-01-21 DIAGNOSIS — D696 Thrombocytopenia, unspecified: Secondary | ICD-10-CM | POA: Diagnosis not present

## 2016-01-21 LAB — CBC WITH DIFFERENTIAL/PLATELET
BASO%: 0 % (ref 0.0–2.0)
Basophils Absolute: 0 10e3/uL (ref 0.0–0.1)
EOS%: 0 % (ref 0.0–7.0)
Eosinophils Absolute: 0 10e3/uL (ref 0.0–0.5)
HCT: 35.9 % — ABNORMAL LOW (ref 38.4–49.9)
HGB: 11.4 g/dL — ABNORMAL LOW (ref 13.0–17.1)
LYMPH%: 22.5 % (ref 14.0–49.0)
MCH: 27.5 pg (ref 27.2–33.4)
MCHC: 31.8 g/dL — ABNORMAL LOW (ref 32.0–36.0)
MCV: 86.7 fL (ref 79.3–98.0)
MONO#: 1.2 10e3/uL — ABNORMAL HIGH (ref 0.1–0.9)
MONO%: 25 % — ABNORMAL HIGH (ref 0.0–14.0)
NEUT#: 2.5 10e3/uL (ref 1.5–6.5)
NEUT%: 52.5 % (ref 39.0–75.0)
Platelets: 85 10e3/uL — ABNORMAL LOW (ref 140–400)
RBC: 4.14 10e6/uL — ABNORMAL LOW (ref 4.20–5.82)
RDW: 16.2 % — ABNORMAL HIGH (ref 11.0–14.6)
WBC: 4.7 10e3/uL (ref 4.0–10.3)
lymph#: 1.1 10e3/uL (ref 0.9–3.3)
nRBC: 0 % (ref 0–0)

## 2016-01-21 NOTE — Telephone Encounter (Signed)
Appointments scheduled per 01/21/16 los. AVS and appointment schedule was given to patient,per 01/21/16 los.  °

## 2016-01-21 NOTE — Assessment & Plan Note (Signed)
This is related to underlying bone marrow disorder with myelodysplastic syndrome. There is no contraindication to remain on aspirin/anticoagulation therapy as long as the platelet is greater than 50,000.

## 2016-01-21 NOTE — Assessment & Plan Note (Signed)
His blood pressure is high likely due to fluid retention from prednisone I recommend he checks his blood pressure more frequently at home and if his blood pressure remained high, to contact his primary care doctor for medication changes

## 2016-01-21 NOTE — Assessment & Plan Note (Signed)
Since discontinuation of Revlimid, the patient remain mildly pancytopenic. Previously while on prednisone 10 mg daily, he responded well to Rx After I reduced the dose to 5 mg daily, he developed recurrent neutropenia. Last month, when I met with the family, I recommend a reduced dose of prednisone to 7.5 mg daily from Mondays to Fridays and then to take 5 mg on the weekend  However, his son misunderstood and was giving him 25 mg of prednisone daily I reinforced the education again and recommend his son to give him the correct doses as directed. I plan to see him back in a month and with the next visit, I recommend he bring all the pill bottles so that I can assess full compliance

## 2016-01-21 NOTE — Progress Notes (Signed)
[place] OFFICE PROGRESS NOTE  Patient Care Team: [name], MD as PCP - General (Internal Medicine) Heath Lark, MD as Consulting Physician (Hematology and Oncology)  SUMMARY OF ONCOLOGIC HISTORY:   Myelodysplastic syndrome ([place])   08/05/2011 Initial Diagnosis    Myelodysplastic syndrome      09/21/2014 Bone Marrow Biopsy    Accession: [id number] showed myelofibrosis and 15% plasma cells      10/15/2014 - 07/23/2015 Chemotherapy    He started taking Revlimid weekly along with dexamethasone weekly       INTERVAL HISTORY: Please see below for problem oriented charting. He returns today with his son for further follow-up. He feels well. He continues to have mild weight gain. Denies recent infection. The patient denies any recent signs or symptoms of bleeding such as spontaneous epistaxis, hematuria or hematochezia. Upon review of his medication dosage, it is clear that the patient has been receiving incorrect dosage of prednisone. He has been receiving approximately 25 mg of prednisone a day for the last month  REVIEW OF SYSTEMS:   Constitutional: Denies fevers, chills or abnormal weight loss Eyes: Denies blurriness of vision Ears, nose, mouth, throat, and face: Denies mucositis or sore throat Respiratory: Denies cough, dyspnea or wheezes Cardiovascular: Denies palpitation, chest discomfort or lower extremity swelling Gastrointestinal:  Denies nausea, heartburn or change in bowel habits Skin: Denies abnormal skin rashes Lymphatics: Denies new lymphadenopathy or easy bruising Neurological:Denies numbness, tingling or new weaknesses Behavioral/Psych: Mood is stable, no new changes  All other systems were reviewed with the patient and are negative.  I have reviewed the past medical history, past surgical history, social history and family history with the patient and they are unchanged from previous note.  ALLERGIES:  has No Known Allergies.  MEDICATIONS:   Current Outpatient Prescriptions  Medication Sig Dispense Refill  . Ascorbic Acid (VITAMIN C) 1000 MG tablet Take 1,000 mg by mouth daily.    Marland Kitchen aspirin EC 81 MG tablet Take 81 mg by mouth daily.    Marland Kitchen atenolol (TENORMIN) 25 MG tablet TAKE 1 TABLET BY MOUTH DAILY 90 tablet 1  . atorvastatin (LIPITOR) 20 MG tablet Take 20 mg by mouth daily.    Marland Kitchen CARTIA XT 180 MG 24 hr capsule Take 1 tablet by mouth daily.    . Cholecalciferol (VITAMIN D) 2000 UNITS CAPS Take 1 capsule by mouth.    . diltiazem (DILACOR XR) 180 MG 24 hr capsule Take 180 mg by mouth daily.    . feeding supplement, ENSURE ENLIVE, (ENSURE ENLIVE) LIQD Take 237 mLs by mouth 2 (two) times daily between meals. 237 mL 12  . furosemide (LASIX) 20 MG tablet Take 20 mg by mouth daily.    Marland Kitchen gabapentin (NEURONTIN) 100 MG capsule Take 100 mg by mouth daily.    Marland Kitchen HYDROcodone-acetaminophen (NORCO/VICODIN) 5-325 MG tablet Take 1 tablet by mouth 2 (two) times daily as needed. Take 1 tab bid or tid prn 60 tablet 0  . latanoprost (XALATAN) 0.005 % ophthalmic solution Place 1 drop into both eyes at bedtime.   4  . levETIRAcetam (KEPPRA) 500 MG tablet Take 1 tablet (500 mg total) by mouth every 12 (twelve) hours. 180 tablet 3  . metoprolol tartrate (LOPRESSOR) 25 MG tablet Take 25 mg by mouth daily.    . Multiple Vitamins-Minerals (MENS MULTIVITAMIN PLUS PO) Take 1 tablet by mouth.    Marland Kitchen MYRBETRIQ 50 MG TB24 tablet Take 50 mg by mouth daily.  3  . predniSONE (DELTASONE)  10 MG tablet Take 1 tablet (10 mg total) by mouth daily with breakfast. 30 tablet 0  . VASCEPA 1 G CAPS Take 1 g by mouth daily.   2  . warfarin (COUMADIN) 5 MG tablet Take 1-1.5 tablets by mouth daily as directed by coumadin clinic (Patient taking differently: Take 2.5-7.5 mg by mouth See admin instructions. Pt takes 2.5mg on Sundays - takes 7.5mg all other days) 135 tablet 1   No current facility-administered medications for this visit.     PHYSICAL EXAMINATION: ECOG PERFORMANCE  STATUS: 1 - Symptomatic but completely ambulatory  Vitals:   01/21/16 0944  BP: (!) 164/77  Pulse: (!) 56  Resp: 18  Temp: 98.7 F (37.1 C)   Filed Weights   01/21/16 0944  Weight: 243 lb 11.2 oz (110.5 kg)    GENERAL:alert, no distress and comfortable. He is moderately obese SKIN: skin color, texture, turgor are normal, no rashes or significant lesions EYES: normal, Conjunctiva are pink and non-injected, sclera clear Musculoskeletal:no cyanosis of digits and no clubbing  NEURO: alert & oriented x 3 with fluent speech, no focal motor/sensory deficits  LABORATORY DATA:  I have reviewed the data as listed    Component Value Date/Time   NA 137 01/16/2016 1428   NA 139 10/14/2015 0915   K 3.7 01/16/2016 1428   K 3.6 10/14/2015 0915   CL 107 01/16/2016 1428   CL 109 (H) 01/02/2012 1526   CO2 23 01/16/2016 1428   CO2 24 10/14/2015 0915   GLUCOSE 130 (H) 01/16/2016 1428   GLUCOSE 130 10/14/2015 0915   GLUCOSE 118 (H) 01/02/2012 1526   BUN 10 01/16/2016 1428   BUN 11.9 10/14/2015 0915   CREATININE 1.07 01/16/2016 1428   CREATININE 1.2 10/14/2015 0915   CALCIUM 8.4 (L) 01/16/2016 1428   CALCIUM 8.7 10/14/2015 0915   PROT 9.0 (H) 01/16/2016 1428   PROT 10.0 (H) 10/14/2015 0915   PROT 10.4 (H) 07/15/2015 0854   ALBUMIN 2.9 (L) 01/16/2016 1428   ALBUMIN 3.1 (L) 07/15/2015 0854   AST 35 01/16/2016 1428   AST 31 07/15/2015 0854   ALT 35 01/16/2016 1428   ALT 20 07/15/2015 0854   ALKPHOS 51 01/16/2016 1428   ALKPHOS 55 07/15/2015 0854   BILITOT 0.5 01/16/2016 1428   BILITOT 0.56 07/15/2015 0854   GFRNONAA >60 01/16/2016 1428   GFRAA >60 01/16/2016 1428    No results found for: SPEP, UPEP  Lab Results  Component Value Date   WBC 4.7 01/21/2016   NEUTROABS 2.5 01/21/2016   HGB 11.4 (L) 01/21/2016   HCT 35.9 (L) 01/21/2016   MCV 86.7 01/21/2016   PLT 85 (L) 01/21/2016      Chemistry      Component Value Date/Time   NA 137 01/16/2016 1428   NA 139 10/14/2015  0915   K 3.7 01/16/2016 1428   K 3.6 10/14/2015 0915   CL 107 01/16/2016 1428   CL 109 (H) 01/02/2012 1526   CO2 23 01/16/2016 1428   CO2 24 10/14/2015 0915   BUN 10 01/16/2016 1428   BUN 11.9 10/14/2015 0915   CREATININE 1.07 01/16/2016 1428   CREATININE 1.2 10/14/2015 0915      Component Value Date/Time   CALCIUM 8.4 (L) 01/16/2016 1428   CALCIUM 8.7 10/14/2015 0915   ALKPHOS 51 01/16/2016 1428   ALKPHOS 55 07/15/2015 0854   AST 35 01/16/2016 1428   AST 31 07/15/2015 0854   ALT 35 01/16/2016 1428     ALT 20 07/15/2015 0854   BILITOT 0.5 01/16/2016 1428   BILITOT 0.56 07/15/2015 0854      ASSESSMENT & PLAN:  Myelodysplastic syndrome (HCC) Since discontinuation of Revlimid, the patient remain mildly pancytopenic. Previously while on prednisone 10 mg daily, he responded well to Rx After I reduced the dose to 5 mg daily, he developed recurrent neutropenia. Last month, when I met with the family, I recommend a reduced dose of prednisone to 7.5 mg daily from Mondays to Fridays and then to take 5 mg on the weekend  However, his son misunderstood and was giving him 25 mg of prednisone daily I reinforced the education again and recommend his son to give him the correct doses as directed. I plan to see him back in a month and with the next visit, I recommend he bring all the pill bottles so that I can assess full compliance  Essential hypertension His blood pressure is high likely due to fluid retention from prednisone I recommend he checks his blood pressure more frequently at home and if his blood pressure remained high, to contact his primary care doctor for medication changes  Thrombocytopenia (HCC) This is related to underlying bone marrow disorder with myelodysplastic syndrome. There is no contraindication to remain on aspirin/anticoagulation therapy as long as the platelet is greater than 50,000.     No orders of the defined types were placed in this encounter.  All  questions were answered. The patient knows to call the clinic with any problems, questions or concerns. No barriers to learning was detected. I spent 15 minutes counseling the patient face to face. The total time spent in the appointment was 20 minutes and more than 50% was on counseling and review of test results     Ni Gorsuch, MD 01/21/2016 12:22 PM   

## 2016-01-22 ENCOUNTER — Other Ambulatory Visit: Payer: Self-pay | Admitting: *Deleted

## 2016-01-22 MED ORDER — PREDNISONE 10 MG PO TABS: ORAL_TABLET | ORAL | 0 refills | Status: DC

## 2016-02-02 ENCOUNTER — Ambulatory Visit (INDEPENDENT_AMBULATORY_CARE_PROVIDER_SITE_OTHER): Payer: Commercial Managed Care - HMO | Admitting: Specialist

## 2016-02-02 ENCOUNTER — Encounter (INDEPENDENT_AMBULATORY_CARE_PROVIDER_SITE_OTHER): Payer: Self-pay | Admitting: Specialist

## 2016-02-02 VITALS — BP 199/84 | HR 88 | Ht 65.0 in | Wt 240.0 lb

## 2016-02-02 DIAGNOSIS — M48062 Spinal stenosis, lumbar region with neurogenic claudication: Secondary | ICD-10-CM | POA: Diagnosis not present

## 2016-02-02 DIAGNOSIS — M5136 Other intervertebral disc degeneration, lumbar region: Secondary | ICD-10-CM | POA: Diagnosis not present

## 2016-02-02 DIAGNOSIS — M5416 Radiculopathy, lumbar region: Secondary | ICD-10-CM

## 2016-02-02 MED ORDER — METHYLPREDNISOLONE 4 MG PO TBPK: ORAL_TABLET | ORAL | 0 refills | Status: DC

## 2016-02-02 NOTE — Patient Instructions (Signed)
Avoid bending, stooping and avoid lifting weights greater than 10 lbs. Avoid prolong standing and walking. Avoid frequent bending and stooping  No lifting greater than 10 lbs. May use ice or moist heat for pain. Weight loss is of benefit. Handicap license is approved.  

## 2016-02-02 NOTE — Progress Notes (Signed)
Office Visit Note   Patient: Terry Robertson.           Date of Birth: [birth year]           MRN: [id number] Visit Date: 02/02/2016              Requested by: [name], MD [address] [place], [place] [address] PCP: [name], MD   Assessment & Plan: Visit Diagnoses:  1. Spinal stenosis of lumbar region with neurogenic claudication   2. DDD (degenerative disc disease), lumbar   3. Lumbar radiculopathy     Plan: Avoid bending, stooping and avoid lifting weights greater than 10 lbs. Avoid prolong standing and walking. Avoid frequent bending and stooping  No lifting greater than 10 lbs. May use ice or moist heat for pain. Weight loss is of benefit. Handicap license is approved.   Follow-Up Instructions: Return in about 3 months (around 05/03/2016).   Orders:  Orders Placed This Encounter  Procedures  . Ambulatory referral to [facility] ordered this encounter  Medications  . methylPREDNISolone (MEDROL DOSEPAK) 4 MG TBPK tablet    Sig: Take as directed    Dispense:  21 tablet    Refill:  0      Procedures: No procedures performed   Clinical Data: Findings:  CT scan lumbar spine 07/2014 show severe spinal stenosis L4-5 and moderate stenosis at L3-4 and L5-S1. Spondylolisthesis L4-5 and L3-4, severe DDD L3-4 , L4-5 and L5-S1.    Subjective: Chief Complaint  Patient presents with  . Lower Back - Pain    Patient is returning for 3 month follow up of low back pain. States he is doing about the same, no changes. Ambulates with cane. Xrays taken in August 2017. States he does have a little pain that radiates into both legs at times.Pain is worsening at night, he is only able to lie down for a short period and then he has to get up and into a recliner. Son notes he is spending most of his time in a recliner. Can only walk about 100'. No bowel or bladder. Primary care Dr. [name] has referred to pain management. Has not been to PT except  when a long time ago. Has not tried a TENS.     Review of Systems  HENT: Positive for rhinorrhea.   Eyes: Positive for visual disturbance.  Respiratory: Positive for cough and wheezing.   Cardiovascular: Negative.   Gastrointestinal: Positive for abdominal pain and constipation.  Endocrine: Negative.   Genitourinary: Negative.   Musculoskeletal: Positive for arthralgias, back pain and gait problem.  Skin: Negative.   Allergic/Immunologic: Negative.   Neurological: Positive for weakness and numbness.  Hematological: Negative.   Psychiatric/Behavioral: Negative.      Objective: Vital Signs: BP (!) 199/84   Pulse 88   Ht _0  (1.651 m)   Wt 240 lb (108.9 kg)   BMI 39.94 kg/m   Physical Exam  Constitutional: He is oriented to person, place, and time. He appears well-developed and well-nourished.  HENT:  Head: Normocephalic and atraumatic.  Eyes: EOM are normal. Pupils are equal, round, and reactive to light.  Neck: Normal range of motion. Neck supple.  Pulmonary/Chest: Effort normal and breath sounds normal.  Abdominal: Soft. Bowel sounds are normal.  Neurological: He is alert and oriented to person, place, and time.  Skin: Skin is warm and dry.  Psychiatric: He has a normal mood and affect. His behavior is normal.  Judgment and thought content normal.    Back Exam   Tenderness  The patient is experiencing tenderness in the lumbar.  Range of Motion  Extension: abnormal  Flexion: normal  Lateral Bend Right: abnormal  Lateral Bend Left: abnormal  Rotation Right: abnormal  Rotation Left: abnormal   Muscle Strength  Right Quadriceps:  4/5  Left Quadriceps:  4/5  Right Hamstrings:  5/5  Left Hamstrings:  5/5   Tests  Straight leg raise right: negative Straight leg raise left: negative  Reflexes  Babinski's sign: normal   Other  Toe Walk: abnormal Heel Walk: abnormal Gait: abnormal  Erythema: no back redness Scars: absent  Comments:  Weak both feet in  dorsiflexion 4/5.      Specialty Comments:  No specialty comments available.  Imaging: No results found.   PMFS History: Patient Active Problem List   Diagnosis Date Noted  . Thrombocytopenia ([place]) 11/04/2015  . Poor dentition 07/23/2015  . Multiple myeloma in remission ([place]) 07/22/2015  . Pancytopenia ([place]) 12/29/2014  . Multiple myeloma ([place]) 09/29/2014  . MGUS (monoclonal gammopathy of unknown significance) 09/21/2014  . Left ventricular diastolic dysfunction, NYHA class 2 (grade 2 dysfunction with elevated filling pressures) 10/01/2013  . Hemothorax, left 06/25/2013  . Severe obesity (BMI >= 40) ([facility]) 03/09/2013  . PAF (paroxysmal atrial fibrillation) ([place]) 03/09/2013  . Long term (current) use of anticoagulants 05/21/2012  . CVA (cerebral infarction) 03/28/2012  . History of likely cardioembolic stroke (3/0/8657) with onset of new diagnosis A. fib; expressive aphasia 08/09/2011  . Diastolic dysfunction: Grade 2 08/07/2011  . Pancytopenia, acquired ([place]) 08/07/2011  . CAD PCI to LAD/CFX in 1997. Cath '08- medical Rx 08/05/2011  . Myelodysplastic syndrome ([place]) 08/05/2011  . Dyslipidemia, goal LDL below 70 08/05/2011  . Essential hypertension 08/05/2011  . S/P TURP Oct 2012 08/05/2011  . DM type 2 (diabetes mellitus, type 2) ([place]) 08/05/2011   Past Medical History:  Diagnosis Date  . Aortic stenosis, mild 08/10/2014   : EF 60-65%, mod Conc LVH, cannot assess Diastolic Fxn.  Mild AS. Mild MVP without MR., Mod LA/RA dilation.  . Asbestosis [facility])    family unclear where he was evaluated in the past  . BPH (benign prostatic hyperplasia)    TURP in 12/2010  . CAD S/P percutaneous coronary angioplasty 1997   status post remote PCI to the LAD and circumflex in 1997, heart cath in 2008 showed widely patent stents in the circumflex and LAD.  RCA had a mid lesion and then was totally occluded distally.  . CHF (congestive heart failure) ([place])   . Diabetes mellitus   .  Essential hypertension   . MGUS (monoclonal gammopathy of unknown significance) 09/21/2014  . Multiple myeloma ([place]) 09/29/2014  . Myelodysplastic syndrome (HCC)    w/mild anemia an neutropenia  . Obesity, morbid ([place])    BMI  . OSA on CPAP   . Paroxysmal atrial fibrillation (HCC)    on coumadin; Diagnosed at the time of his 1st CVA.  CHA2DS2VASC = 6. On  Warfarin  . Seizures ([place])    Post CVA; on Keppra; stable  . ST-segment elevation myocardial infarction (STEMI) of inferior wall ([place]) 1997   inferior MI with left circumflex stent  . Stroke ([place]) 08/2011, 03/2012   L MCA (Afib RVR while admitted for hypotension) - expressive aphasia (almost totally recovered)    Family History  Problem Relation Age of Onset  . Heart disease Mother   . Heart attack Father 79  Past Surgical History:  Procedure Laterality Date  . CARDIAC CATHETERIZATION  02/08/07   widely patent stent in the circumflex and LAD.  RCA had a mid lesion and then was totally occluded distally. Cook bare-metal 3.5x20 mm stent  . NM MYOVIEW LTD  01/09/1999   Inferior wall thinning with possible mild ischemia versus infarct. This would correlate well with his known RCA occlusion  . TRANSTHORACIC ECHOCARDIOGRAM  08/06/11; 08/2014   a) EF 55% to 65% with grade2 DD/pseudonormal w/ mildly dilated LA with high LAP/LVEDP; b) EF 60-65%, mod Conc LVH, cannot assess Diastolic Fxn.  Mild AS. Mild MVP without MR., Mod LA/RA dilation.  . TRANSURETHRAL RESECTION OF PROSTATE  12/2010   Social History   Occupational History  . Not on file.   Social History Main Topics  . Smoking status: Former Smoker    Packs/day: 1.00    Years: 10.00    Types: Cigarettes    Quit date: 03/08/1988  . Smokeless tobacco: Never Used     Comment: smoked cigars and cigarettes  . Alcohol use No  . Drug use: No  . Sexual activity: Not on file

## 2016-02-15 ENCOUNTER — Emergency Department (HOSPITAL_COMMUNITY): Payer: Commercial Managed Care - HMO

## 2016-02-15 ENCOUNTER — Encounter (HOSPITAL_COMMUNITY): Payer: Self-pay

## 2016-02-15 ENCOUNTER — Inpatient Hospital Stay (HOSPITAL_COMMUNITY)
Admission: EM | Admit: 2016-02-15 | Discharge: 2016-02-19 | DRG: 193 | Disposition: A | Discharge status: Home Health | Payer: Commercial Managed Care - HMO | Attending: Internal Medicine | Admitting: Internal Medicine

## 2016-02-15 DIAGNOSIS — I10 Essential (primary) hypertension: Secondary | ICD-10-CM

## 2016-02-15 DIAGNOSIS — Z8673 Personal history of transient ischemic attack (TIA), and cerebral infarction without residual deficits: Secondary | ICD-10-CM | POA: Diagnosis not present

## 2016-02-15 DIAGNOSIS — Z6841 Body Mass Index (BMI) 40.0 and over, adult: Secondary | ICD-10-CM

## 2016-02-15 DIAGNOSIS — I2510 Atherosclerotic heart disease of native coronary artery without angina pectoris: Secondary | ICD-10-CM | POA: Diagnosis present

## 2016-02-15 DIAGNOSIS — G4733 Obstructive sleep apnea (adult) (pediatric): Secondary | ICD-10-CM | POA: Diagnosis present

## 2016-02-15 DIAGNOSIS — E119 Type 2 diabetes mellitus without complications: Secondary | ICD-10-CM | POA: Diagnosis present

## 2016-02-15 DIAGNOSIS — G8929 Other chronic pain: Secondary | ICD-10-CM | POA: Diagnosis present

## 2016-02-15 DIAGNOSIS — I5189 Other ill-defined heart diseases: Secondary | ICD-10-CM

## 2016-02-15 DIAGNOSIS — J181 Lobar pneumonia, unspecified organism: Secondary | ICD-10-CM

## 2016-02-15 DIAGNOSIS — I110 Hypertensive heart disease with heart failure: Secondary | ICD-10-CM | POA: Diagnosis present

## 2016-02-15 DIAGNOSIS — N400 Enlarged prostate without lower urinary tract symptoms: Secondary | ICD-10-CM | POA: Diagnosis present

## 2016-02-15 DIAGNOSIS — I480 Paroxysmal atrial fibrillation: Secondary | ICD-10-CM | POA: Diagnosis not present

## 2016-02-15 DIAGNOSIS — Z7901 Long term (current) use of anticoagulants: Secondary | ICD-10-CM

## 2016-02-15 DIAGNOSIS — M549 Dorsalgia, unspecified: Secondary | ICD-10-CM | POA: Diagnosis present

## 2016-02-15 DIAGNOSIS — Z79899 Other long term (current) drug therapy: Secondary | ICD-10-CM

## 2016-02-15 DIAGNOSIS — Z9861 Coronary angioplasty status: Secondary | ICD-10-CM

## 2016-02-15 DIAGNOSIS — D696 Thrombocytopenia, unspecified: Secondary | ICD-10-CM | POA: Diagnosis present

## 2016-02-15 DIAGNOSIS — K5900 Constipation, unspecified: Secondary | ICD-10-CM | POA: Diagnosis present

## 2016-02-15 DIAGNOSIS — D469 Myelodysplastic syndrome, unspecified: Secondary | ICD-10-CM | POA: Diagnosis present

## 2016-02-15 DIAGNOSIS — Z8589 Personal history of malignant neoplasm of other organs and systems: Secondary | ICD-10-CM

## 2016-02-15 DIAGNOSIS — I350 Nonrheumatic aortic (valve) stenosis: Secondary | ICD-10-CM | POA: Diagnosis present

## 2016-02-15 DIAGNOSIS — R319 Hematuria, unspecified: Secondary | ICD-10-CM | POA: Diagnosis present

## 2016-02-15 DIAGNOSIS — Z955 Presence of coronary angioplasty implant and graft: Secondary | ICD-10-CM | POA: Diagnosis not present

## 2016-02-15 DIAGNOSIS — D61818 Other pancytopenia: Secondary | ICD-10-CM | POA: Diagnosis present

## 2016-02-15 DIAGNOSIS — G9340 Encephalopathy, unspecified: Secondary | ICD-10-CM | POA: Diagnosis present

## 2016-02-15 DIAGNOSIS — Z8249 Family history of ischemic heart disease and other diseases of the circulatory system: Secondary | ICD-10-CM

## 2016-02-15 DIAGNOSIS — R195 Other fecal abnormalities: Secondary | ICD-10-CM | POA: Diagnosis present

## 2016-02-15 DIAGNOSIS — R4701 Aphasia: Secondary | ICD-10-CM | POA: Diagnosis present

## 2016-02-15 DIAGNOSIS — R32 Unspecified urinary incontinence: Secondary | ICD-10-CM | POA: Diagnosis present

## 2016-02-15 DIAGNOSIS — R569 Unspecified convulsions: Secondary | ICD-10-CM | POA: Diagnosis present

## 2016-02-15 DIAGNOSIS — R4182 Altered mental status, unspecified: Secondary | ICD-10-CM | POA: Diagnosis present

## 2016-02-15 DIAGNOSIS — R3129 Other microscopic hematuria: Secondary | ICD-10-CM | POA: Diagnosis present

## 2016-02-15 DIAGNOSIS — Z79891 Long term (current) use of opiate analgesic: Secondary | ICD-10-CM

## 2016-02-15 DIAGNOSIS — N179 Acute kidney failure, unspecified: Secondary | ICD-10-CM | POA: Diagnosis present

## 2016-02-15 DIAGNOSIS — I639 Cerebral infarction, unspecified: Secondary | ICD-10-CM | POA: Diagnosis present

## 2016-02-15 DIAGNOSIS — Z7952 Long term (current) use of systemic steroids: Secondary | ICD-10-CM

## 2016-02-15 DIAGNOSIS — E785 Hyperlipidemia, unspecified: Secondary | ICD-10-CM

## 2016-02-15 DIAGNOSIS — D649 Anemia, unspecified: Secondary | ICD-10-CM | POA: Diagnosis present

## 2016-02-15 DIAGNOSIS — Z7982 Long term (current) use of aspirin: Secondary | ICD-10-CM

## 2016-02-15 DIAGNOSIS — I252 Old myocardial infarction: Secondary | ICD-10-CM

## 2016-02-15 DIAGNOSIS — Z87891 Personal history of nicotine dependence: Secondary | ICD-10-CM

## 2016-02-15 DIAGNOSIS — Z9989 Dependence on other enabling machines and devices: Secondary | ICD-10-CM

## 2016-02-15 DIAGNOSIS — J189 Pneumonia, unspecified organism: Secondary | ICD-10-CM | POA: Diagnosis not present

## 2016-02-15 LAB — URINALYSIS, ROUTINE W REFLEX MICROSCOPIC
BILIRUBIN URINE: NEGATIVE
Bacteria, UA: NONE SEEN
GLUCOSE, UA: NEGATIVE mg/dL
Ketones, ur: NEGATIVE mg/dL
Leukocytes, UA: NEGATIVE
Nitrite: NEGATIVE
PH: 7 (ref 5.0–8.0)
Protein, ur: >=300 mg/dL — AB
SPECIFIC GRAVITY, URINE: 1.019 (ref 1.005–1.030)

## 2016-02-15 LAB — COMPREHENSIVE METABOLIC PANEL
ALK PHOS: 44 U/L (ref 38–126)
ALT: 25 U/L (ref 17–63)
ANION GAP: 9 (ref 5–15)
AST: 35 U/L (ref 15–41)
Albumin: 2.8 g/dL — ABNORMAL LOW (ref 3.5–5.0)
BILIRUBIN TOTAL: 0.7 mg/dL (ref 0.3–1.2)
BUN: 11 mg/dL (ref 6–20)
CALCIUM: 8.5 mg/dL — AB (ref 8.9–10.3)
CO2: 22 mmol/L (ref 22–32)
Chloride: 107 mmol/L (ref 101–111)
Creatinine, Ser: 1.03 mg/dL (ref 0.61–1.24)
Glucose, Bld: 110 mg/dL — ABNORMAL HIGH (ref 65–99)
POTASSIUM: 3.7 mmol/L (ref 3.5–5.1)
Sodium: 138 mmol/L (ref 135–145)
TOTAL PROTEIN: 8.9 g/dL — AB (ref 6.5–8.1)

## 2016-02-15 LAB — CBC WITH DIFFERENTIAL/PLATELET
BASOS ABS: 0 10*3/uL (ref 0.0–0.1)
Basophils Relative: 0 %
EOS ABS: 0 10*3/uL (ref 0.0–0.7)
Eosinophils Relative: 0 %
HCT: 32.5 % — ABNORMAL LOW (ref 39.0–52.0)
HEMOGLOBIN: 9.9 g/dL — AB (ref 13.0–17.0)
LYMPHS PCT: 55 %
Lymphs Abs: 3 10*3/uL (ref 0.7–4.0)
MCH: 27.9 pg (ref 26.0–34.0)
MCHC: 30.5 g/dL (ref 30.0–36.0)
MCV: 91.5 fL (ref 78.0–100.0)
MONO ABS: 1 10*3/uL (ref 0.1–1.0)
MONOS PCT: 18 %
NEUTROS PCT: 14 %
Neutro Abs: 0.8 10*3/uL — ABNORMAL LOW (ref 1.7–7.7)
OTHER: 13 %
PLATELETS: 41 10*3/uL — AB (ref 150–400)
RBC: 3.55 MIL/uL — AB (ref 4.22–5.81)
RDW: 18.9 % — ABNORMAL HIGH (ref 11.5–15.5)
WBC: 5.5 10*3/uL (ref 4.0–10.5)

## 2016-02-15 LAB — PROTIME-INR
INR: 3.08
PROTHROMBIN TIME: 32.4 s — AB (ref 11.4–15.2)

## 2016-02-15 LAB — CBC
HCT: 29.9 % — ABNORMAL LOW (ref 39.0–52.0)
HEMOGLOBIN: 9.2 g/dL — AB (ref 13.0–17.0)
MCH: 28.1 pg (ref 26.0–34.0)
MCHC: 30.8 g/dL (ref 30.0–36.0)
MCV: 91.4 fL (ref 78.0–100.0)
PLATELETS: 35 10*3/uL — AB (ref 150–400)
RBC: 3.27 MIL/uL — AB (ref 4.22–5.81)
RDW: 18.7 % — ABNORMAL HIGH (ref 11.5–15.5)
WBC: 6.6 10*3/uL (ref 4.0–10.5)

## 2016-02-15 LAB — RAPID URINE DRUG SCREEN, HOSP PERFORMED
Amphetamines: NOT DETECTED
BENZODIAZEPINES: NOT DETECTED
Barbiturates: NOT DETECTED
COCAINE: NOT DETECTED
OPIATES: POSITIVE — AB
Tetrahydrocannabinol: NOT DETECTED

## 2016-02-15 LAB — TYPE AND SCREEN
ABO/RH(D): O POS
Antibody Screen: NEGATIVE

## 2016-02-15 LAB — POC OCCULT BLOOD, ED: FECAL OCCULT BLD: POSITIVE — AB

## 2016-02-15 LAB — I-STAT CG4 LACTIC ACID, ED: Lactic Acid, Venous: 1.74 mmol/L (ref 0.5–1.9)

## 2016-02-15 MED ORDER — ENSURE ENLIVE PO LIQD
237.0000 mL | Freq: Two times a day (BID) | ORAL | Status: DC
  Administered 2016-02-16 – 2016-02-19 (×6): 237 mL via ORAL

## 2016-02-15 MED ORDER — ACETAMINOPHEN 325 MG PO TABS
650.0000 mg | ORAL_TABLET | Freq: Four times a day (QID) | ORAL | Status: DC | PRN
  Administered 2016-02-16 – 2016-02-18 (×2): 650 mg via ORAL
  Filled 2016-02-15 (×2): qty 2

## 2016-02-15 MED ORDER — ACETAMINOPHEN 650 MG RE SUPP: 650.0000 mg | Freq: Four times a day (QID) | RECTAL | Status: DC | PRN

## 2016-02-15 MED ORDER — DEXTROSE 5 % IV SOLN
500.0000 mg | Freq: Once | INTRAVENOUS | Status: AC
  Administered 2016-02-15: 500 mg via INTRAVENOUS
  Filled 2016-02-15: qty 500

## 2016-02-15 MED ORDER — LEVETIRACETAM 500 MG PO TABS
500.0000 mg | ORAL_TABLET | Freq: Two times a day (BID) | ORAL | Status: DC
  Administered 2016-02-15 – 2016-02-19 (×8): 500 mg via ORAL
  Filled 2016-02-15 (×8): qty 1

## 2016-02-15 MED ORDER — DILTIAZEM HCL ER COATED BEADS 180 MG PO CP24
180.0000 mg | ORAL_CAPSULE | Freq: Every day | ORAL | Status: DC
  Administered 2016-02-16 – 2016-02-19 (×4): 180 mg via ORAL
  Filled 2016-02-15 (×4): qty 1

## 2016-02-15 MED ORDER — DEXTROSE 5 % IV SOLN
1.0000 g | Freq: Once | INTRAVENOUS | Status: AC
  Administered 2016-02-15: 1 g via INTRAVENOUS
  Filled 2016-02-15: qty 10

## 2016-02-15 MED ORDER — ATORVASTATIN CALCIUM 20 MG PO TABS
20.0000 mg | ORAL_TABLET | Freq: Every day | ORAL | Status: DC
  Administered 2016-02-16 – 2016-02-18 (×3): 20 mg via ORAL
  Filled 2016-02-15 (×4): qty 1

## 2016-02-15 MED ORDER — METOPROLOL TARTRATE 25 MG PO TABS
25.0000 mg | ORAL_TABLET | Freq: Every day | ORAL | Status: DC
  Administered 2016-02-16 – 2016-02-18 (×3): 25 mg via ORAL
  Filled 2016-02-15 (×3): qty 1

## 2016-02-15 MED ORDER — GABAPENTIN 100 MG PO CAPS
100.0000 mg | ORAL_CAPSULE | Freq: Every day | ORAL | Status: DC
  Administered 2016-02-16 – 2016-02-19 (×4): 100 mg via ORAL
  Filled 2016-02-15 (×4): qty 1

## 2016-02-15 MED ORDER — OMEGA-3-ACID ETHYL ESTERS 1 G PO CAPS
1.0000 g | ORAL_CAPSULE | Freq: Every day | ORAL | Status: DC
  Administered 2016-02-16 – 2016-02-19 (×4): 1 g via ORAL
  Filled 2016-02-15 (×5): qty 1

## 2016-02-15 MED ORDER — MIRABEGRON ER 25 MG PO TB24
50.0000 mg | ORAL_TABLET | Freq: Every day | ORAL | Status: DC
  Administered 2016-02-16 – 2016-02-19 (×4): 50 mg via ORAL
  Filled 2016-02-15: qty 1
  Filled 2016-02-15 (×3): qty 2

## 2016-02-15 MED ORDER — ONDANSETRON HCL 4 MG/2ML IJ SOLN: 4.0000 mg | Freq: Four times a day (QID) | INTRAMUSCULAR | Status: DC | PRN

## 2016-02-15 MED ORDER — ONDANSETRON HCL 4 MG PO TABS: 4.0000 mg | ORAL_TABLET | Freq: Four times a day (QID) | ORAL | Status: DC | PRN

## 2016-02-15 MED ORDER — ATENOLOL 50 MG PO TABS: 25.0000 mg | ORAL_TABLET | Freq: Every day | ORAL | Status: DC

## 2016-02-15 MED ORDER — VITAMIN C 500 MG PO TABS
1000.0000 mg | ORAL_TABLET | Freq: Every day | ORAL | Status: DC
  Administered 2016-02-16 – 2016-02-19 (×4): 1000 mg via ORAL
  Filled 2016-02-15 (×4): qty 2

## 2016-02-15 MED ORDER — ASPIRIN EC 81 MG PO TBEC
81.0000 mg | DELAYED_RELEASE_TABLET | Freq: Every day | ORAL | Status: DC
  Administered 2016-02-16 – 2016-02-19 (×4): 81 mg via ORAL
  Filled 2016-02-15 (×4): qty 1

## 2016-02-15 MED ORDER — SODIUM CHLORIDE 0.9 % IV SOLN
1000.0000 mL | INTRAVENOUS | Status: DC
  Administered 2016-02-15: 1000 mL via INTRAVENOUS

## 2016-02-15 MED ORDER — ACETAMINOPHEN 325 MG PO TABS
650.0000 mg | ORAL_TABLET | Freq: Once | ORAL | Status: AC
  Administered 2016-02-15: 650 mg via ORAL
  Filled 2016-02-15: qty 2

## 2016-02-15 MED ORDER — LATANOPROST 0.005 % OP SOLN
1.0000 [drp] | Freq: Every day | OPHTHALMIC | Status: DC
  Administered 2016-02-15 – 2016-02-17 (×3): 1 [drp] via OPHTHALMIC
  Filled 2016-02-15: qty 2.5

## 2016-02-15 NOTE — Progress Notes (Signed)
ANTICOAGULATION CONSULT NOTE - Initial Consult  Pharmacy Consult for warfarin  Indication: AFib   No Known Allergies   Vital Signs: Temp: 101.8 F (38.8 C) (12/12 1627) Temp Source: Oral (12/12 1627) BP: 136/72 (12/12 2045) Pulse Rate: 97 (12/12 2045)  Labs:  Recent Labs  02/15/16 1744 02/15/16 1815  HGB 9.9*  --   HCT 32.5*  --   PLT 41*  --   LABPROT  --  32.4*  INR  --  3.08  CREATININE 1.03  --     Estimated Creatinine Clearance: 59.7 mL/min (by C-G formula based on SCr of 1.03 mg/dL).   Medical History: Past Medical History:  Diagnosis Date  . Aortic stenosis, mild 08/10/2014   : EF 60-65%, mod Conc LVH, cannot assess Diastolic Fxn.  Mild AS. Mild MVP without MR., Mod LA/RA dilation.  . Asbestosis [facility])    family unclear where he was evaluated in the past  . BPH (benign prostatic hyperplasia)    TURP in 12/2010  . CAD S/P percutaneous coronary angioplasty 1997   status post remote PCI to the LAD and circumflex in 1997, heart cath in 2008 showed widely patent stents in the circumflex and LAD.  RCA had a mid lesion and then was totally occluded distally.  . CHF (congestive heart failure) ([place])   . Diabetes mellitus   . Essential hypertension   . MGUS (monoclonal gammopathy of unknown significance) 09/21/2014  . Multiple myeloma ([place]) 09/29/2014  . Myelodysplastic syndrome (HCC)    w/mild anemia an neutropenia  . Obesity, morbid ([place])    BMI  . OSA on CPAP   . Paroxysmal atrial fibrillation (HCC)    on coumadin; Diagnosed at the time of his 1st CVA.  CHA2DS2VASC = 6. On  Warfarin  . Seizures ([place])    Post CVA; on Keppra; stable  . ST-segment elevation myocardial infarction (STEMI) of inferior wall ([place]) 1997   inferior MI with left circumflex stent  . Stroke ([place]) 08/2011, 03/2012   L MCA (Afib RVR while admitted for hypotension) - expressive aphasia (almost totally recovered)     Assessment: Admitted with confusion and difficulty ambulating.  Pt is on  warfarin PTA for AFib. INR 3 Start heparin when INR <2.   Goal of Therapy:  Heparin level 0.3-0.7 units/ml Monitor platelets by anticoagulation protocol: Yes   Plan:  -Will start heparin gtt when INR < 2 -Daily INR    Harvel Quale 02/15/2016,9:58 PM

## 2016-02-15 NOTE — ED Notes (Signed)
Report was given to 5W.  Patient is stable to be transported at this time.  Belongings taken with family and patient

## 2016-02-15 NOTE — ED Provider Notes (Signed)
[facility] DEPT Provider Note   CSN: [id number] Arrival date & time: 02/15/16  1627     History   Chief Complaint Chief Complaint  Patient presents with  . Altered Mental Status    HPI Terry Kutzer. is a 82 y.o. male.  HPI Patient is an 82 year old male with past medical history significant for CAD, CHF, diabetes, hypertension, BPH, myelodysplastic syndrome with paroxysmal atrial fibrillation, on chronic Coumadin anticoagulation who presents with concern for generalized fatigue and altered mental status. Family report patient has been less active and not as talkative over the past 2 days. He has also had several episodes of urinary incontinence. He has also been complaining of worsening of his chronic back pain. He has been constipated, reports last bowel movement was 2 days ago, though girlfriend noted small bowel movement on changing patient today. No fevers, chills, cough, chest pain, abdominal pain. No hematuria, melena or hematochezia. At baseline, family report that patient is alert and oriented to self and conversant. Patient has not had any recent medication changes. He is taking all his medicines as prescribed.  Past Medical History:  Diagnosis Date  . Aortic stenosis, mild 08/10/2014   : EF 60-65%, mod Conc LVH, cannot assess Diastolic Fxn.  Mild AS. Mild MVP without MR., Mod LA/RA dilation.  . Asbestosis [facility])    family unclear where he was evaluated in the past  . BPH (benign prostatic hyperplasia)    TURP in 12/2010  . CAD S/P percutaneous coronary angioplasty 1997   status post remote PCI to the LAD and circumflex in 1997, heart cath in 2008 showed widely patent stents in the circumflex and LAD.  RCA had a mid lesion and then was totally occluded distally.  . CHF (congestive heart failure) ([place])   . Diabetes mellitus   . Essential hypertension   . MGUS (monoclonal gammopathy of unknown significance) 09/21/2014  . Multiple myeloma ([place]) 09/29/2014  .  Myelodysplastic syndrome (HCC)    w/mild anemia an neutropenia  . Obesity, morbid ([place])    BMI  . OSA on CPAP   . Paroxysmal atrial fibrillation (HCC)    on coumadin; Diagnosed at the time of his 1st CVA.  CHA2DS2VASC = 6. On  Warfarin  . Seizures ([place])    Post CVA; on Keppra; stable  . ST-segment elevation myocardial infarction (STEMI) of inferior wall ([place]) 1997   inferior MI with left circumflex stent  . Stroke ([place]) 08/2011, 03/2012   L MCA (Afib RVR while admitted for hypotension) - expressive aphasia (almost totally recovered)    Patient Active Problem List   Diagnosis Date Noted  . CAP (community acquired pneumonia) 02/15/2016  . Pneumonia 02/15/2016  . Normocytic anemia 02/15/2016  . Thrombocytopenia ([place]) 11/04/2015  . Poor dentition 07/23/2015  . Multiple myeloma in remission ([place]) 07/22/2015  . Pancytopenia ([place]) 12/29/2014  . Multiple myeloma ([place]) 09/29/2014  . MGUS (monoclonal gammopathy of unknown significance) 09/21/2014  . Left ventricular diastolic dysfunction, NYHA class 2 (grade 2 dysfunction with elevated filling pressures) 10/01/2013  . Hemothorax, left 06/25/2013  . Severe obesity (BMI >= 40) ([place]) 03/09/2013  . PAF (paroxysmal atrial fibrillation) ([place]) 03/09/2013  . Long term (current) use of anticoagulants 05/21/2012  . CVA (cerebral infarction) 03/28/2012  . History of likely cardioembolic stroke (06/09/6254) with onset of new diagnosis A. fib; expressive aphasia 08/09/2011  . Diastolic dysfunction: Grade 2 08/07/2011  . Pancytopenia, acquired ([place]) 08/07/2011  . CAD PCI to LAD/CFX in 1997. Cath '08- medical  Rx 08/05/2011  . Myelodysplastic syndrome ([place]) 08/05/2011  . Dyslipidemia, goal LDL below 70 08/05/2011  . Essential hypertension 08/05/2011  . S/P TURP Oct 2012 08/05/2011  . DM type 2 (diabetes mellitus, type 2) ([place]) 08/05/2011    Past Surgical History:  Procedure Laterality Date  . CARDIAC CATHETERIZATION  02/08/07   widely patent stent in  the circumflex and LAD.  RCA had a mid lesion and then was totally occluded distally. Cook bare-metal 3.5x20 mm stent  . NM MYOVIEW LTD  01/09/1999   Inferior wall thinning with possible mild ischemia versus infarct. This would correlate well with his known RCA occlusion  . TRANSTHORACIC ECHOCARDIOGRAM  08/06/11; 08/2014   a) EF 55% to 65% with grade2 DD/pseudonormal w/ mildly dilated LA with high LAP/LVEDP; b) EF 60-65%, mod Conc LVH, cannot assess Diastolic Fxn.  Mild AS. Mild MVP without MR., Mod LA/RA dilation.  . TRANSURETHRAL RESECTION OF PROSTATE  12/2010       Home Medications    Prior to Admission medications   Medication Sig Start Date End Date Taking? Authorizing Provider  atorvastatin (LIPITOR) 20 MG tablet Take 20 mg by mouth daily. 08/11/13  Yes Historical Provider, MD  HYDROcodone-acetaminophen (NORCO/VICODIN) 5-325 MG tablet Take 1 tablet by mouth 2 (two) times daily as needed. Take 1 tab bid or tid prn 01/03/16  Yes Jessy Oto, MD  levETIRAcetam (KEPPRA) 500 MG tablet Take 1 tablet (500 mg total) by mouth every 12 (twelve) hours. 01/18/15  Yes Dennie Bible, NP  metoprolol tartrate (LOPRESSOR) 25 MG tablet Take 25 mg by mouth daily. 12/09/15  Yes Historical Provider, MD  polyethylene glycol (MIRALAX / GLYCOLAX) packet Take 17 g by mouth daily.   Yes Historical Provider, MD  predniSONE (DELTASONE) 2.5 MG tablet Take 7.5 mg by mouth daily with breakfast.   Yes Historical Provider, MD  VASCEPA 1 G CAPS Take 1 g by mouth daily.  07/29/14  Yes Historical Provider, MD  warfarin (COUMADIN) 5 MG tablet Take 1-1.5 tablets by mouth daily as directed by coumadin clinic Patient taking differently: Take 5-7.5 mg by mouth See admin instructions. Takes 38m on sun only, takes 7.578mall other days 12/01/15  Yes DaLeonie ManMD  Ascorbic Acid (VITAMIN C) 1000 MG tablet Take 1,000 mg by mouth daily.    Historical Provider, MD  aspirin EC 81 MG tablet Take 81 mg by mouth daily.     Historical Provider, MD  atenolol (TENORMIN) 25 MG tablet TAKE 1 TABLET BY MOUTH DAILY 05/28/15   DaLeonie ManMD  CARTIA XT 180 MG 24 hr capsule Take 1 tablet by mouth daily. 04/13/15   Historical Provider, MD  Cholecalciferol (VITAMIN D) 2000 UNITS CAPS Take 1 capsule by mouth.    Historical Provider, MD  diltiazem (DILACOR XR) 180 MG 24 hr capsule Take 180 mg by mouth daily.    Historical Provider, MD  feeding supplement, ENSURE ENLIVE, (ENSURE ENLIVE) LIQD Take 237 mLs by mouth 2 (two) times daily between meals. 08/14/14   FaFlorencia ReasonsMD  furosemide (LASIX) 20 MG tablet Take 20 mg by mouth daily. 05/05/14   Historical Provider, MD  gabapentin (NEURONTIN) 100 MG capsule Take 100 mg by mouth daily. 11/23/13   Historical Provider, MD  latanoprost (XALATAN) 0.005 % ophthalmic solution Place 1 drop into both eyes at bedtime.  02/17/14   Historical Provider, MD  MYRBETRIQ 50 MG TB24 tablet Take 50 mg by mouth daily. 07/06/14   Historical Provider,  MD    Family History Family History  Problem Relation Age of Onset  . Heart disease Mother   . Heart attack Father 46    Social History Social History  Substance Use Topics  . Smoking status: Former Smoker    Packs/day: 1.00    Years: 10.00    Types: Cigarettes    Quit date: 03/08/1988  . Smokeless tobacco: Never Used     Comment: smoked cigars and cigarettes  . Alcohol use No     Allergies   Patient has no known allergies.   Review of Systems Review of Systems  Constitutional: Positive for fatigue. Negative for chills and fever.  HENT: Negative for ear pain and sore throat.   Eyes: Negative for pain and visual disturbance.  Respiratory: Negative for cough and shortness of breath.   Cardiovascular: Negative for chest pain and palpitations.  Gastrointestinal: Positive for constipation. Negative for abdominal pain and vomiting.  Genitourinary: Negative for dysuria and hematuria.  Musculoskeletal: Positive for back pain. Negative for  arthralgias.  Skin: Negative for color change and rash.  Neurological: Negative for seizures and syncope.  All other systems reviewed and are negative.    Physical Exam Updated Vital Signs BP 136/62 (BP Location: Left Arm)   Pulse 100   Temp 100.3 F (37.9 C) (Oral)   Resp 18   Wt 113.2 kg   SpO2 96%   BMI 41.53 kg/m   Physical Exam  Constitutional: He appears well-developed.  Appears chronically ill  HENT:  Head: Normocephalic and atraumatic.  Eyes: Conjunctivae are normal.  Neck: Neck supple.  Cardiovascular: Normal rate and regular rhythm.   No murmur heard. Pulmonary/Chest: Effort normal. No respiratory distress. He has decreased breath sounds. He has no wheezes. He has no rhonchi. He has no rales.  Abdominal: Soft. He exhibits distension. There is no tenderness. There is no guarding.  Genitourinary: Rectal exam shows guaiac positive stool.  Genitourinary Comments: Digital rectal exam reveals brown stool, no gross blood. Prostate is enlarged and non-tender. Patient has normal rectal sensation and tone.   Musculoskeletal: He exhibits no edema.  Neurological: He is alert.  Oriented to self and place (at baseline per family). 5/5 strength bilateral upper and lower extremities. Sensation intact to light touch throughout. Cerebellar tests normal.   Skin: Skin is warm and dry.  Psychiatric: He has a normal mood and affect.  Nursing note and vitals reviewed.    ED Treatments / Results  Labs (all labs ordered are listed, but only abnormal results are displayed) Labs Reviewed  COMPREHENSIVE METABOLIC PANEL - Abnormal; Notable for the following:       Result Value   Glucose, Bld 110 (*)    Calcium 8.5 (*)    Total Protein 8.9 (*)    Albumin 2.8 (*)    All other components within normal limits  CBC WITH DIFFERENTIAL/PLATELET - Abnormal; Notable for the following:    RBC 3.55 (*)    Hemoglobin 9.9 (*)    HCT 32.5 (*)    RDW 18.9 (*)    Platelets 41 (*)    Neutro Abs  0.8 (*)    All other components within normal limits  URINALYSIS, ROUTINE W REFLEX MICROSCOPIC - Abnormal; Notable for the following:    Hgb urine dipstick SMALL (*)    Protein, ur >=300 (*)    Squamous Epithelial / LPF 0-5 (*)    All other components within normal limits  PROTIME-INR - Abnormal; Notable for the following:  Prothrombin Time 32.4 (*)    All other components within normal limits  RAPID URINE DRUG SCREEN, HOSP PERFORMED - Abnormal; Notable for the following:    Opiates POSITIVE (*)    All other components within normal limits  CBC - Abnormal; Notable for the following:    RBC 3.27 (*)    Hemoglobin 9.2 (*)    HCT 29.9 (*)    RDW 18.7 (*)    Platelets 35 (*)    All other components within normal limits  POC OCCULT BLOOD, ED - Abnormal; Notable for the following:    Fecal Occult Bld POSITIVE (*)    All other components within normal limits  CULTURE, BLOOD (ROUTINE X 2)  CULTURE, BLOOD (ROUTINE X 2)  URINE CULTURE  CULTURE, EXPECTORATED SPUTUM-ASSESSMENT  GRAM STAIN  INFLUENZA PANEL BY PCR (TYPE A & B, H1N1)  STREP PNEUMONIAE URINARY ANTIGEN  BASIC METABOLIC PANEL  CBC  LEGIONELLA PNEUMOPHILA SEROGP 1 UR AG  PROTIME-INR  I-STAT CG4 LACTIC ACID, ED  TYPE AND SCREEN    EKG  EKG Interpretation  Date/Time:  Tuesday February 15 2016 17:21:50 EST Ventricular Rate:  106 PR Interval:    QRS Duration: 102 QT Interval:  311 QTC Calculation: 413 R Axis:   18 Text Interpretation:  Sinus tachycardia Borderline T abnormalities, diffuse leads No significant change since last tracing Confirmed by FLOYD MD, DANIEL [phone]) on 02/15/2016 5:24:27 PM       Radiology Dg Chest 2 View  Result Date: 02/15/2016 CLINICAL DATA:  Initial evaluation for acute fever. Evaluate for pneumonia. EXAM: CHEST  2 VIEW COMPARISON:  Prior radiograph from 01/16/2016. FINDINGS: Stable cardiomegaly.  Mediastinal silhouette within normal limits. Lungs mildly hypoinflated. A left-sided  pleural effusion is similar to previous. Subtly increased left basilar opacity, which may reflect atelectasis or infiltrate. No other focal airspace disease. No pneumothorax. No acute osseous abnormality. IMPRESSION: Persistent left pleural effusion with slightly increased left basilar opacity, which may reflect atelectasis or infiltrate. Electronically Signed   By: Jeannine Boga M.D.   On: 02/15/2016 18:07    Procedures Procedures (including critical care time)  Medications Ordered in ED Medications  metoprolol tartrate (LOPRESSOR) tablet 25 mg (not administered)  atenolol (TENORMIN) tablet 25 mg (not administered)  diltiazem (CARDIZEM CD) 24 hr capsule 180 mg (not administered)  levETIRAcetam (KEPPRA) tablet 500 mg (500 mg Oral Given 02/15/16 2257)  vitamin C (ASCORBIC ACID) tablet 1,000 mg (not administered)  feeding supplement (ENSURE ENLIVE) (ENSURE ENLIVE) liquid 237 mL (not administered)  omega-3 acid ethyl esters (LOVAZA) capsule 1 g (not administered)  mirabegron ER (MYRBETRIQ) tablet 50 mg (not administered)  latanoprost (XALATAN) 0.005 % ophthalmic solution 1 drop (1 drop Both Eyes Given 02/15/16 2257)  gabapentin (NEURONTIN) capsule 100 mg (not administered)  atorvastatin (LIPITOR) tablet 20 mg (not administered)  aspirin EC tablet 81 mg (not administered)  acetaminophen (TYLENOL) tablet 650 mg (not administered)    Or  acetaminophen (TYLENOL) suppository 650 mg (not administered)  ondansetron (ZOFRAN) tablet 4 mg (not administered)    Or  ondansetron (ZOFRAN) injection 4 mg (not administered)  cefTRIAXone (ROCEPHIN) 1 g in dextrose 5 % 50 mL IVPB (0 g Intravenous Stopped 02/15/16 1909)  acetaminophen (TYLENOL) tablet 650 mg (650 mg Oral Given 02/15/16 1838)  azithromycin (ZITHROMAX) 500 mg in dextrose 5 % 250 mL IVPB (0 mg Intravenous Stopped 02/15/16 2105)     Initial Impression / Assessment and Plan / ED Course  I have reviewed the triage vital  signs and the  nursing notes.  Pertinent labs & imaging results that were available during my care of the patient were reviewed by me and considered in my medical decision making (see chart for details).  Clinical Course    Patient is an 82 year old male who presents with generalized fatigue and altered mental status as described above. Patient is febrile, tachycardic and mildly tachypnea on arrival. No focal neurologic deficits identified on exam. Patient is not oriented to date but family states this is at baseline. Code sepsis initiated for abnormal vital signs and fever. Patient started on IV Rocephin initially for presumed urinary tract infection given urinary incontinence over the past 2 days. MIVF started. Tylenol given for fever.  EKG shows sinus tachycardia without acute ischemic changes. Chest x-ray left basilar opacity increased compared with prior concerning for pneumonia. IV azithromycin added antibiotics to cover for continued prior pneumonia. No leukocytosis on CBC. Patient has known anemia and thrombocytopenia. However anemia has worsened with hemoglobin 9.9 compared with 11.4 one month ago. Rectal exam does not show melena or hematochezia. Stool is Hemoccult positive. UA positive for red blood cells but no signs of infection. This may be secondary to anticoagulation. INR is 3. Lactic acid WNL. Blood and urine cultures are pending.  Hospitalist consulted for admission and triaged patient. Patient will be admitted for further telemetry monitoring, IV antibiotics for treatment of sepsis due to CAP, and work up of worsening anemia.  Patient seen and discussed with Dr. Tyrone Nine, ED attending  Final Clinical Impressions(s) / ED Diagnoses   Final diagnoses:  Community acquired pneumonia of left lower lobe of lung ([place])  Anemia, unspecified type    New Prescriptions Current Discharge Medication List       Gibson Ramp, MD 02/16/16 [place], Terry Robertson 02/16/16 1704

## 2016-02-15 NOTE — Progress Notes (Signed)
Called for report from ED nurse attempt  x 1

## 2016-02-15 NOTE — H&P (Addendum)
History and Physical    [name]. [id number]:[id number] DOB: 01/10/1929 DOA: 02/15/2016  PCP: [name], MD  Patient coming from: Home.  Chief Complaint: Altered mental status.  History obtained from patient's son.  HPI: Lavontay Kirk. is a 82 y.o. male with CAD status post stenting, myelodysplastic syndrome, atrial fibrillation, seizures was brought to the ER after patient was found to be increasingly confused today. As per patient's and patient has been having cough over the last 2 weeks. Did not have any chest pain nausea vomiting or diarrhea. In the ER patient was found to be febrile tachycardic and chest x-ray showing features concerning for pneumonia. Patient is being admitted for further management of pneumonia being treated as community-acquired pneumonia.   Patient's labs revealed hemoglobin lower than baseline. Stool for blood was positive though patient had no melena and stool was brown in color.  ED Course: Chest x-ray shows pneumonia. Patient was febrile in the ER. Patient was started on ceftriaxone and Zithromax.  Review of Systems: As per HPI, rest all negative.   Past Medical History:  Diagnosis Date  . Aortic stenosis, mild 08/10/2014   : EF 60-65%, mod Conc LVH, cannot assess Diastolic Fxn.  Mild AS. Mild MVP without MR., Mod LA/RA dilation.  . Asbestosis [facility])    family unclear where he was evaluated in the past  . BPH (benign prostatic hyperplasia)    TURP in 12/2010  . CAD S/P percutaneous coronary angioplasty 1997   status post remote PCI to the LAD and circumflex in 1997, heart cath in 2008 showed widely patent stents in the circumflex and LAD.  RCA had a mid lesion and then was totally occluded distally.  . CHF (congestive heart failure) ([place])   . Diabetes mellitus   . Essential hypertension   . MGUS (monoclonal gammopathy of unknown significance) 09/21/2014  . Multiple myeloma ([place]) 09/29/2014  . Myelodysplastic syndrome (HCC)    w/mild anemia an  neutropenia  . Obesity, morbid ([place])    BMI  . OSA on CPAP   . Paroxysmal atrial fibrillation (HCC)    on coumadin; Diagnosed at the time of his 1st CVA.  CHA2DS2VASC = 6. On  Warfarin  . Seizures ([place])    Post CVA; on Keppra; stable  . ST-segment elevation myocardial infarction (STEMI) of inferior wall ([place]) 1997   inferior MI with left circumflex stent  . Stroke ([place]) 08/2011, 03/2012   L MCA (Afib RVR while admitted for hypotension) - expressive aphasia (almost totally recovered)    Past Surgical History:  Procedure Laterality Date  . CARDIAC CATHETERIZATION  02/08/07   widely patent stent in the circumflex and LAD.  RCA had a mid lesion and then was totally occluded distally. Cook bare-metal 3.5x20 mm stent  . NM MYOVIEW LTD  01/09/1999   Inferior wall thinning with possible mild ischemia versus infarct. This would correlate well with his known RCA occlusion  . TRANSTHORACIC ECHOCARDIOGRAM  08/06/11; 08/2014   a) EF 55% to 65% with grade2 DD/pseudonormal w/ mildly dilated LA with high LAP/LVEDP; b) EF 60-65%, mod Conc LVH, cannot assess Diastolic Fxn.  Mild AS. Mild MVP without MR., Mod LA/RA dilation.  . TRANSURETHRAL RESECTION OF PROSTATE  12/2010     reports that he quit smoking about 27 years ago. His smoking use included Cigarettes. He has a 10.00 pack-year smoking history. He has never used smokeless tobacco. He reports that he does not drink alcohol or use drugs.  No Known  Allergies  Family History  Problem Relation Age of Onset  . Heart disease Mother   . Heart attack Father 17    Prior to Admission medications   Medication Sig Start Date End Date Taking? Authorizing Provider  Ascorbic Acid (VITAMIN C) 1000 MG tablet Take 1,000 mg by mouth daily.    Historical Provider, MD  aspirin EC 81 MG tablet Take 81 mg by mouth daily.    Historical Provider, MD  atenolol (TENORMIN) 25 MG tablet TAKE 1 TABLET BY MOUTH DAILY 05/28/15   Leonie Man, MD  atorvastatin (LIPITOR) 20 MG  tablet Take 20 mg by mouth daily. 08/11/13   Historical Provider, MD  CARTIA XT 180 MG 24 hr capsule Take 1 tablet by mouth daily. 04/13/15   Historical Provider, MD  Cholecalciferol (VITAMIN D) 2000 UNITS CAPS Take 1 capsule by mouth.    Historical Provider, MD  diltiazem (DILACOR XR) 180 MG 24 hr capsule Take 180 mg by mouth daily.    Historical Provider, MD  feeding supplement, ENSURE ENLIVE, (ENSURE ENLIVE) LIQD Take 237 mLs by mouth 2 (two) times daily between meals. 08/14/14   Florencia Reasons, MD  furosemide (LASIX) 20 MG tablet Take 20 mg by mouth daily. 05/05/14   Historical Provider, MD  gabapentin (NEURONTIN) 100 MG capsule Take 100 mg by mouth daily. 11/23/13   Historical Provider, MD  HYDROcodone-acetaminophen (NORCO/VICODIN) 5-325 MG tablet Take 1 tablet by mouth 2 (two) times daily as needed. Take 1 tab bid or tid prn 01/03/16   Jessy Oto, MD  latanoprost (XALATAN) 0.005 % ophthalmic solution Place 1 drop into both eyes at bedtime.  02/17/14   Historical Provider, MD  levETIRAcetam (KEPPRA) 500 MG tablet Take 1 tablet (500 mg total) by mouth every 12 (twelve) hours. 01/18/15   Dennie Bible, NP  methylPREDNISolone (MEDROL DOSEPAK) 4 MG TBPK tablet Take as directed 02/02/16   Jessy Oto, MD  metoprolol tartrate (LOPRESSOR) 25 MG tablet Take 25 mg by mouth daily. 12/09/15   Historical Provider, MD  Multiple Vitamins-Minerals (MENS MULTIVITAMIN PLUS PO) Take 1 tablet by mouth.    Historical Provider, MD  MYRBETRIQ 50 MG TB24 tablet Take 50 mg by mouth daily. 07/06/14   Historical Provider, MD  predniSONE (DELTASONE) 10 MG tablet Take 7.5 mg Monday, Tuesday, Wednesday, Thursday and Friday. Take 5 mg on Saturday and Sunday 01/22/16   Heath Lark, MD  VASCEPA 1 G CAPS Take 1 g by mouth daily.  07/29/14   Historical Provider, MD  warfarin (COUMADIN) 5 MG tablet Take 1-1.5 tablets by mouth daily as directed by coumadin clinic Patient taking differently: Take 2.5-7.5 mg by mouth See admin  instructions. Pt takes 2.75m on Sundays - takes 7.536mall other days 12/01/15   DaLeonie ManMD    Physical Exam: Vitals:   02/15/16 1930 02/15/16 2015 02/15/16 2030 02/15/16 2045  BP: 145/73 128/67 127/63 136/72  Pulse: 109 98 99 97  Resp: (!) 29 (!) 30 (!) 31 (!) 29  Temp:      TempSrc:      SpO2: 92% 93% 94% 92%      Constitutional: Moderately built and nourished. Vitals:   02/15/16 1930 02/15/16 2015 02/15/16 2030 02/15/16 2045  BP: 145/73 128/67 127/63 136/72  Pulse: 109 98 99 97  Resp: (!) 29 (!) 30 (!) 31 (!) 29  Temp:      TempSrc:      SpO2: 92% 93% 94% 92%   Eyes:  Anicteric no pallor. ENMT: No discharge from the ears eyes nose and mouth. Neck: No mass felt. No neck rigidity. Respiratory: No rhonchi or crepitations. Cardiovascular: S1 and S2 heard. No murmurs appreciated. Abdomen: Soft nontender bowel sounds present. No guarding or rigidity. Musculoskeletal: No edema. Skin: No rash. Neurologic: Alert awake oriented to time place and person. Moves all extremities. Psychiatric: Appears normal. Normal affect.   Labs on Admission: I have personally reviewed following labs and imaging studies  CBC:  Recent Labs Lab 02/15/16 1744  WBC 5.5  NEUTROABS 0.8*  HGB 9.9*  HCT 32.5*  MCV 91.5  PLT 41*   Basic Metabolic Panel:  Recent Labs Lab 02/15/16 1744  NA 138  K 3.7  CL 107  CO2 22  GLUCOSE 110*  BUN 11  CREATININE 1.03  CALCIUM 8.5*   GFR: Estimated Creatinine Clearance: 59.7 mL/min (by C-G formula based on SCr of 1.03 mg/dL). Liver Function Tests:  Recent Labs Lab 02/15/16 1744  AST 35  ALT 25  ALKPHOS 44  BILITOT 0.7  PROT 8.9*  ALBUMIN 2.8*   No results for input(s): LIPASE, AMYLASE in the last 168 hours. No results for input(s): AMMONIA in the last 168 hours. Coagulation Profile:  Recent Labs Lab 02/15/16 1815  INR 3.08   Cardiac Enzymes: No results for input(s): CKTOTAL, CKMB, CKMBINDEX, TROPONINI in the last 168  hours. BNP (last 3 results) No results for input(s): PROBNP in the last 8760 hours. HbA1C: No results for input(s): HGBA1C in the last 72 hours. CBG: No results for input(s): GLUCAP in the last 168 hours. Lipid Profile: No results for input(s): CHOL, HDL, LDLCALC, TRIG, CHOLHDL, LDLDIRECT in the last 72 hours. Thyroid Function Tests: No results for input(s): TSH, T4TOTAL, FREET4, T3FREE, THYROIDAB in the last 72 hours. Anemia Panel: No results for input(s): VITAMINB12, FOLATE, FERRITIN, TIBC, IRON, RETICCTPCT in the last 72 hours. Urine analysis:    Component Value Date/Time   COLORURINE YELLOW 02/15/2016 1853   APPEARANCEUR CLEAR 02/15/2016 1853   LABSPEC 1.019 02/15/2016 1853   PHURINE 7.0 02/15/2016 1853   GLUCOSEU NEGATIVE 02/15/2016 1853   HGBUR SMALL (A) 02/15/2016 1853   BILIRUBINUR NEGATIVE 02/15/2016 1853   KETONESUR NEGATIVE 02/15/2016 1853   PROTEINUR >=300 (A) 02/15/2016 1853   UROBILINOGEN 1.0 08/09/2014 1115   NITRITE NEGATIVE 02/15/2016 1853   LEUKOCYTESUR NEGATIVE 02/15/2016 1853   Sepsis Labs: '@LABRCNTIP' (procalcitonin:4,lacticidven:4) )No results found for this or any previous visit (from the past 240 hour(s)).   Radiological Exams on Admission: Dg Chest 2 View  Result Date: 02/15/2016 CLINICAL DATA:  Initial evaluation for acute fever. Evaluate for pneumonia. EXAM: CHEST  2 VIEW COMPARISON:  Prior radiograph from 01/16/2016. FINDINGS: Stable cardiomegaly.  Mediastinal silhouette within normal limits. Lungs mildly hypoinflated. A left-sided pleural effusion is similar to previous. Subtly increased left basilar opacity, which may reflect atelectasis or infiltrate. No other focal airspace disease. No pneumothorax. No acute osseous abnormality. IMPRESSION: Persistent left pleural effusion with slightly increased left basilar opacity, which may reflect atelectasis or infiltrate. Electronically Signed   By: Jeannine Boga M.D.   On: 02/15/2016 18:07    EKG:  Independently reviewed - sinus tachycardia. Assessment/Plan Principal Problem:   CAP (community acquired pneumonia) Active Problems:   CAD PCI to LAD/CFX in 1997. Cath '08- medical Rx   Myelodysplastic syndrome [facility])   Essential hypertension   Diastolic dysfunction: Grade 2   History of likely cardioembolic stroke (03/11/1094) with onset of new diagnosis A. fib; expressive aphasia  PAF (paroxysmal atrial fibrillation) (HCC)   Pancytopenia (HCC)   Pneumonia   Normocytic anemia    1. Community-acquired pneumonia - patient has been placed on ceftriaxone and Zithromax. Follow urine Legionella and strep antigen influenza PCR blood cultures and should cultures. 2. Acute encephalopathy secondary to #1 improving with antibiotics. 3. CAD status post stenting - denies any chest pain. Patient is on Coumadin. 4. Anemia with history of myelodysplastic syndrome with worsening hemoglobin with stool for occult blood positive - for now will hold off Coumadin and place patient on heparin. Closely follow CBCs. If there is any further worsening of hemoglobin may have to hold anticoagulation and may need GI workup. 5. Myelodysplastic syndrome - see #4. 6. History of seizures on Keppra. No recent seizures noted as per the family. 7. Paroxysmal atrial fibrillation - chest a vasc score more than 2. Patient is on Cardizem and metoprolol and Coumadin. See #4. 8. Hematuria - follow CBC. Check urine cultures. May need further workup as outpatient.   DVT prophylaxis: Heparin. Code Status: Full code.  Family Communication: Discussed with patient's son.  Disposition Plan: Home.  Consults called: None.  Admission status: Inpatient.    Rise Patience MD [facility] Pager [phone].  If 7PM-7AM, please contact night-coverage [email or web address] Password [facility]  02/15/2016, 9:42 PM

## 2016-02-15 NOTE — ED Triage Notes (Signed)
Patient comes from home for confusion and difficulty ambulating for 2 days.  Had home health there today and he was not able to do activities as he usually does.  Patient has had increased urination and strong smelling urine.  Patient is A&Ox3 does not know time.

## 2016-02-16 DIAGNOSIS — Z9861 Coronary angioplasty status: Secondary | ICD-10-CM

## 2016-02-16 DIAGNOSIS — D469 Myelodysplastic syndrome, unspecified: Secondary | ICD-10-CM

## 2016-02-16 DIAGNOSIS — I2510 Atherosclerotic heart disease of native coronary artery without angina pectoris: Secondary | ICD-10-CM

## 2016-02-16 DIAGNOSIS — I480 Paroxysmal atrial fibrillation: Secondary | ICD-10-CM

## 2016-02-16 LAB — BASIC METABOLIC PANEL
ANION GAP: 10 (ref 5–15)
BUN: 10 mg/dL (ref 6–20)
CALCIUM: 7.9 mg/dL — AB (ref 8.9–10.3)
CO2: 21 mmol/L — ABNORMAL LOW (ref 22–32)
Chloride: 108 mmol/L (ref 101–111)
Creatinine, Ser: 1.13 mg/dL (ref 0.61–1.24)
GFR calc Af Amer: >60 mL/min (ref >60)
GFR calc non Af Amer: 57 mL/min — ABNORMAL LOW (ref >60)
GLUCOSE: 125 mg/dL — AB (ref 65–99)
Potassium: 4 mmol/L (ref 3.5–5.1)
Sodium: 139 mmol/L (ref 135–145)

## 2016-02-16 LAB — CBC
HCT: 28.9 % — ABNORMAL LOW (ref 39.0–52.0)
HEMOGLOBIN: 8.8 g/dL — AB (ref 13.0–17.0)
MCH: 27.5 pg (ref 26.0–34.0)
MCHC: 30.4 g/dL (ref 30.0–36.0)
MCV: 90.3 fL (ref 78.0–100.0)
Platelets: 38 10*3/uL — ABNORMAL LOW (ref 150–400)
RBC: 3.2 MIL/uL — ABNORMAL LOW (ref 4.22–5.81)
RDW: 18.9 % — ABNORMAL HIGH (ref 11.5–15.5)
WBC: 7.2 10*3/uL (ref 4.0–10.5)

## 2016-02-16 LAB — INFLUENZA PANEL BY PCR (TYPE A & B)
INFLAPCR: NEGATIVE
Influenza B By PCR: NEGATIVE

## 2016-02-16 LAB — PROTIME-INR
INR: 2.72
Prothrombin Time: 29.4 seconds — ABNORMAL HIGH (ref 11.4–15.2)

## 2016-02-16 LAB — PATHOLOGIST SMEAR REVIEW

## 2016-02-16 LAB — STREP PNEUMONIAE URINARY ANTIGEN: Strep Pneumo Urinary Antigen: NEGATIVE

## 2016-02-16 MED ORDER — METHYLPREDNISOLONE SODIUM SUCC 40 MG IJ SOLR
20.0000 mg | Freq: Every day | INTRAMUSCULAR | Status: DC
  Administered 2016-02-16 – 2016-02-18 (×3): 20 mg via INTRAVENOUS
  Filled 2016-02-16 (×3): qty 1

## 2016-02-16 MED ORDER — HYDROCODONE-ACETAMINOPHEN 5-325 MG PO TABS
1.0000 | ORAL_TABLET | Freq: Two times a day (BID) | ORAL | Status: DC | PRN
  Administered 2016-02-17: 1 via ORAL
  Filled 2016-02-16: qty 1

## 2016-02-16 MED ORDER — DEXTROSE 5 % IV SOLN
1.0000 g | INTRAVENOUS | Status: DC
  Administered 2016-02-16 – 2016-02-17 (×2): 1 g via INTRAVENOUS
  Filled 2016-02-16 (×3): qty 10

## 2016-02-16 MED ORDER — DEXTROSE 5 % IV SOLN
500.0000 mg | INTRAVENOUS | Status: DC
  Administered 2016-02-16 – 2016-02-17 (×2): 500 mg via INTRAVENOUS
  Filled 2016-02-16 (×3): qty 500

## 2016-02-16 NOTE — Progress Notes (Signed)
Rehab Admissions Coordinator Note:  Patient was screened by Cleatrice Burke for appropriateness for an Inpatient Acute Rehab Consult per OT recommendation. PT recommends SNF. Noted current diagnosis PNA. [employer] unlikely to approve admission for this current diagnosis. I will follow his progress over next 24-48 hrs to assess if pt appropriate for an inpt rehab consultation.  Cleatrice Burke 02/16/2016, 5:54 PM  I can be reached at [phone].

## 2016-02-16 NOTE — Evaluation (Signed)
Occupational Therapy Evaluation Patient Details Name: Terry Robertson. MRN: [id number] DOB: [birth year] Today's Date: 02/16/2016    History of Present Illness Pt is an 82 y/o male who presents with AMS. PMH includes CVA, seizures, diabetes, CHF.   Clinical Impression   Pt currently with slower problem solving and cognitive processing, demonstrates decreased overall sitting and standing balance as well as all mobility.  Currently total assist for supine to sit transition with total assist for static sitting balance.  Mod to Max assist +2 for standing to complete LB selfcare and for taking 2 small steps up toward the top of the bed.  Family reports prior to this admission that he could ambulate with a cane at supervision in the house and needed only occasional min assist for selfcare tasks.  Feel he will benefit from acute care OT based on current need for +2 assist with selfcare tasks.  Will benefit from CIR level therapy for continued rehab prior to discharge home with significant other or daughter.      Follow Up Recommendations  CIR    Equipment Recommendations  Other (comment) (to be determined)       Precautions / Restrictions Precautions Precautions: Fall Restrictions Weight Bearing Restrictions: No      Mobility Bed Mobility Overal bed mobility: Needs Assistance Bed Mobility: Supine to Sit;Sit to Supine     Supine to sit: Max assist;+2 for physical assistance Sit to supine: Max assist;+2 for physical assistance   General bed mobility comments: Pt needed assist with all aspects of bed mobility with transfer to EOB and then back to supine.    Transfers Overall transfer level: Needs assistance Equipment used: 2 person hand held assist Transfers: Sit to/from Stand Sit to Stand: Mod assist;+2 physical assistance         General transfer comment: Posterior lean with decreased full standing noted.     Balance Overall balance assessment: Needs  assistance Sitting-balance support: Feet supported Sitting balance-Leahy Scale: Poor Sitting balance - Comments: Posterior fall in sitting with total assist needed to maintain balance statically.     Standing balance-Leahy Scale: Poor Standing balance comment: Plus 2 assist for standing with BUE support during periwashing.                             ADL Overall ADL's : Needs assistance/impaired Eating/Feeding: Moderate assistance   Grooming: Wash/dry hands;Wash/dry face;Moderate assistance;Sitting   Upper Body Bathing: Moderate assistance;Sitting   Lower Body Bathing: +2 for safety/equipment;Moderate assistance;Sit to/from stand       Lower Body Dressing: +2 for physical assistance;Moderate assistance   Toilet Transfer: +2 for physical assistance;Maximal assistance   Toileting- Clothing Manipulation and Hygiene: +2 for safety/equipment;Moderate assistance;Sit to/from stand         General ADL Comments: Pt with increased posterior lean in sitting requring max assist to maintain static sitting balance.  Mod to max + 2 for sit to stand during peri washing and for taking 2-3 small steps up toward the top of the bed. Increased posterior lean as well in standing with decreased hip extension and slight flexed posture in the lower back.  Heart rate 115-118 in bed with O2 sats at 91% on room air.       Vision Vision Assessment?: Vision impaired- to be further tested in functional context   Perception Perception Perception Tested?: No   Praxis Praxis Praxis tested?: Within functional limits    Pertinent Vitals/Pain Pain  Assessment: 0-10 Faces Pain Scale: Hurts little more Pain Location: Back pain Pain Descriptors / Indicators: Discomfort Pain Intervention(s): Limited activity within patient's tolerance;Repositioned     Hand Dominance Right   Extremity/Trunk Assessment Upper Extremity Assessment Upper Extremity Assessment: LUE deficits/detail;RUE  deficits/detail RUE Deficits / Details: AROM shoulder flexion 0-90 degrees,  shoulder strength 3+/5, elbow flexion/extension 4/5, grip 3+/5 LUE Deficits / Details: AROM for shoulder flexion 0-90 degrees bilaterally with shoulder strength 3+/5, elbow flexion/extension 4/5 as well as grip 4/5   Lower Extremity Assessment Lower Extremity Assessment: Defer to PT evaluation LLE Deficits / Details: Pt minimally moving LE's in general but appeared to be weaker in the LLE.    Cervical / Trunk Assessment Cervical / Trunk Assessment: Kyphotic Cervical / Trunk Exceptions: Pt's girlfriend reports that he has chronic back pain   Communication Communication Communication: No difficulties   Cognition Arousal/Alertness: Lethargic Behavior During Therapy: Flat affect Overall Cognitive Status: Impaired/Different from baseline Area of Impairment: Orientation;Memory;Safety/judgement;Awareness Orientation Level: Disoriented to;Time;Situation Current Attention Level: Focused Memory: Decreased short-term memory Following Commands: Follows one step commands with increased time Safety/Judgement: Decreased awareness of safety;Decreased awareness of deficits Awareness: Intellectual Problem Solving: Slow processing;Requires verbal cues;Requires tactile cues                Home Living Family/patient expects to be discharged to:: Private residence Living Arrangements: Children Available Help at Discharge: Family;Available 24 hours/day Type of Home: House Home Access: Stairs to enter [employer] of Steps: 4 Entrance Stairs-Rails: Right;Can reach both;Left Home Layout: One level     Bathroom Shower/Tub: Occupational psychologist: Handicapped height Bathroom Accessibility: Yes   Home Equipment: Environmental consultant - 2 wheels;Shower seat;Cane - single point          Prior Functioning/Environment Level of Independence: Independent with assistive device(s)        Comments: Girlfriend  reports that he is usually independent with ADL's but used the cane most of the time for ambulation. Occasionally uses the walker.         OT Problem List: Decreased strength;Decreased activity tolerance;Impaired balance (sitting and/or standing);Decreased safety awareness;Decreased cognition;Decreased knowledge of use of DME or AE;Pain   OT Treatment/Interventions: Self-care/ADL training;Neuromuscular education;Therapeutic activities;DME and/or AE instruction;Cognitive remediation/compensation;Balance training;Patient/family education;Therapeutic exercise    OT Goals(Current goals can be found in the care plan section) Acute Rehab OT Goals Patient Stated Goal: Family wants him to get back to his PLOF. OT Goal Formulation: With family Time For Goal Achievement: 03/01/16 Potential to Achieve Goals: Good  OT Frequency: Min 2X/week              End of Session Nurse Communication: Mobility status  Activity Tolerance: Patient limited by lethargy Patient left: in bed;with call bell/phone within reach;with family/visitor present   Time: [id number] OT Time Calculation (min): 32 min Charges:  OT General Charges $OT Visit: 1 Procedure OT Evaluation $OT Eval Moderate Complexity: 1 Procedure OT Treatments $Self Care/Home Management : 8-22 mins  Enyla Lisbon OTR/L 02/16/2016, 5:00 PM

## 2016-02-16 NOTE — Progress Notes (Signed)
[facility]  Patient Status: Active (receiving services up to time of hospitalization)  [facility] is providing the following services: PT  If patient discharges after hours, please call [phone].   Terry Robertson 02/16/2016, 12:30 PM

## 2016-02-16 NOTE — Consult Note (Signed)
   [facility] CM Inpatient Consult   02/16/2016  Terry Robertson. 1928-11-20 [id number]    [facility] Care Management follow up on referral. Telephone call made to daughter, no answer. Call made to son, [name] at [phone] to discuss [facility] Management services. [name] asked for hospital liaison to contact him a little later to discuss [place] Management program because he is on the way to take his wife to [facility]. Made him aware writer will follow up at later time.   Marthenia Rolling, MSN-Ed, RN,BSN [facility] Liaison [phone]

## 2016-02-16 NOTE — Evaluation (Signed)
Physical Therapy Evaluation Patient Details Name: Terry Robertson. MRN: [id number] DOB: [birth year] Today's Date: 02/16/2016   History of Present Illness  Pt is an 82 y/o male who presents with AMS. PMH includes CVA, seizures, diabetes, CHF.  Clinical Impression  Pt admitted with above diagnosis. Pt currently with functional limitations due to the deficits listed below (see PT Problem List). Able to complete a bed level eval at this time due to level of assist required for any mobility. Pt was not able to attempt bed mobility without +2 assist. Appears to have some level of L side inattention, not turning his head to the left, and only moving his eyes past midline if heavily cued. L upper and lower extremities appear weaker than right. Unclear if this is residual weakness and effects from previous stroke or new symptoms. RN notified. Pt will benefit from skilled PT to increase their independence and safety with mobility to allow discharge to the venue listed below.       Follow Up Recommendations SNF;Supervision/Assistance - 24 hour    Equipment Recommendations  Other (comment) (TBD by progress with PT)    Recommendations for Other Services       Precautions / Restrictions Precautions Precautions: Fall Restrictions Weight Bearing Restrictions: No      Mobility  Bed Mobility Overal bed mobility: Needs Assistance             General bed mobility comments: Attempted bed mobility and was not able to complete safely with +1 assist. Max cues provided (visual, auditory, verbal, tactile). Pt dependent assist at this time for all mobility.   Transfers                    Ambulation/Gait                Stairs            Wheelchair Mobility    Modified Rankin (Stroke Patients Only) Modified Rankin (Stroke Patients Only) Pre-Morbid Rankin Score: No significant disability Modified Rankin: Severe disability     Balance                                              Pertinent Vitals/Pain Pain Assessment: No/denies pain    Home Living Family/patient expects to be discharged to:: Private residence Living Arrangements: Children Available Help at Discharge: Family;Available 24 hours/day Type of Home: House Home Access: Stairs to enter Entrance Stairs-Rails: Right;Can reach Software engineer of Steps: 4 Home Layout: One level Home Equipment: Walker - 2 wheels;Shower seat;Cane - single point      Prior Function Level of Independence: Independent with assistive device(s)         Comments: Girlfriend reports that he is usually independent with ADL's but used the cane most of the time for ambulation. Occasionally uses the walker.      Hand Dominance   Dominant Hand: Right    Extremity/Trunk Assessment   Upper Extremity Assessment Upper Extremity Assessment: Generalized weakness;LUE deficits/detail LUE Deficits / Details: L UE appears weaker than R - unclear if this is residual weakness from prior stroke.     Lower Extremity Assessment Lower Extremity Assessment: Generalized weakness;LLE deficits/detail LLE Deficits / Details: Pt minimally moving LE's in general but appeared to be weaker in the LLE.     Cervical / Trunk Assessment Cervical / Trunk Assessment: Other  exceptions Cervical / Trunk Exceptions: Pt's girlfriend reports that he has chronic back pain  Communication   Communication: No difficulties  Cognition Arousal/Alertness: Awake/alert Behavior During Therapy: Flat affect Overall Cognitive Status: Impaired/Different from baseline Area of Impairment: Orientation;Attention;Memory;Following commands;Safety/judgement;Awareness;Problem solving Orientation Level: Disoriented to;Person;Time;Place;Situation Current Attention Level: Selective Memory: Decreased short-term memory Following Commands: Follows one step commands inconsistently;Follows one step commands with increased  time Safety/Judgement: Decreased awareness of safety;Decreased awareness of deficits Awareness: Emergent Problem Solving: Slow processing;Decreased initiation;Difficulty sequencing;Requires verbal cues      General Comments      Exercises     Assessment/Plan    PT Assessment Patient needs continued PT services  PT Problem List Decreased strength;Decreased range of motion;Decreased activity tolerance;Decreased balance;Decreased mobility;Decreased cognition;Decreased knowledge of use of DME;Decreased safety awareness;Decreased knowledge of precautions;Pain          PT Treatment Interventions DME instruction;Gait training;Stair training;Functional mobility training;Therapeutic activities;Therapeutic exercise;Neuromuscular re-education;Patient/family education;Cognitive remediation    PT Goals (Current goals can be found in the Care Plan section)  Acute Rehab PT Goals Patient Stated Goal: Pt did not state goals PT Goal Formulation: Patient unable to participate in goal setting Time For Goal Achievement: 03/01/16 Potential to Achieve Goals: Fair    Frequency Min 3X/week   Barriers to discharge        Co-evaluation               End of Session Equipment Utilized During Treatment: Oxygen Activity Tolerance: Patient limited by lethargy;Patient limited by fatigue (weakness) Patient left: in bed;with call bell/phone within reach;with bed alarm set;with family/visitor present Nurse Communication: Mobility status;Need for lift equipment;Patient requests pain meds         Time: 1335-1355 PT Time Calculation (min) (ACUTE ONLY): 20 min   Charges:   PT Evaluation $PT Eval Moderate Complexity: 1 Procedure     PT G Codes:        Thelma Comp 03-09-16, 2:15 PM  Rolinda Roan, PT, DPT Acute Rehabilitation Services Pager: [phone]

## 2016-02-16 NOTE — Progress Notes (Signed)
       Patient arrived on the unit via bed to room 5w26.  Patient is alert and oriented x 3.  No skin issues.  Educated the patient on the proper use of the phone to call the nurse. Lowered the bed. Set the bed alarm and placed call bell within reach

## 2016-02-16 NOTE — Progress Notes (Signed)
ANTICOAGULATION CONSULT NOTE - Initial Consult  Pharmacy Consult for  Heparin (start when INR <2) Indication: AFib   No Known Allergies   Vital Signs: Temp: 100.3 F (37.9 C) (12/12 2202) Temp Source: Oral (12/12 2202) BP: 136/62 (12/12 2202) Pulse Rate: 100 (12/12 2202)  Labs:  Recent Labs  02/15/16 1744 02/15/16 1815 02/15/16 2148 02/16/16 0446  HGB 9.9*  --  9.2* 8.8*  HCT 32.5*  --  29.9* 28.9*  PLT 41*  --  35* 38*  LABPROT  --  32.4*  --  29.4*  INR  --  3.08  --  2.72  CREATININE 1.03  --   --  1.13    Estimated Creatinine Clearance: 55.6 mL/min (by C-G formula based on SCr of 1.13 mg/dL).   Medical History: Past Medical History:  Diagnosis Date  . Aortic stenosis, mild 08/10/2014   : EF 60-65%, mod Conc LVH, cannot assess Diastolic Fxn.  Mild AS. Mild MVP without MR., Mod LA/RA dilation.  . Asbestosis [facility])    family unclear where he was evaluated in the past  . BPH (benign prostatic hyperplasia)    TURP in 12/2010  . CAD S/P percutaneous coronary angioplasty 1997   status post remote PCI to the LAD and circumflex in 1997, heart cath in 2008 showed widely patent stents in the circumflex and LAD.  RCA had a mid lesion and then was totally occluded distally.  . CHF (congestive heart failure) ([place])   . Diabetes mellitus   . Essential hypertension   . MGUS (monoclonal gammopathy of unknown significance) 09/21/2014  . Multiple myeloma ([place]) 09/29/2014  . Myelodysplastic syndrome (HCC)    w/mild anemia an neutropenia  . Obesity, morbid ([place])    BMI  . OSA on CPAP   . Paroxysmal atrial fibrillation (HCC)    on coumadin; Diagnosed at the time of his 1st CVA.  CHA2DS2VASC = 6. On  Warfarin  . Seizures ([place])    Post CVA; on Keppra; stable  . ST-segment elevation myocardial infarction (STEMI) of inferior wall ([place]) 1997   inferior MI with left circumflex stent  . Stroke ([place]) 08/2011, 03/2012   L MCA (Afib RVR while admitted for hypotension) - expressive aphasia  (almost totally recovered)     Assessment: Admitted with confusion and difficulty ambulating.  Pt is on warfarin PTA for AFib. INR 3.08 on admit >2.72 today Pta warf dose : 7.5 mg daily except 5 mg qSun., last taken 12/11 PM Start heparin when INR <2. -thrombocytopenia on admit. ,  PLTC 35>38k , pltc were 55k on 01/21/16  Hgb 9.2>8.8.  H/o multiple myeloma and myelodysplaxtic syndrome with mild anemia and neutropenia noted in PMH.  INR remains therapeutic, above 2 thus will not start IV heparin today. F/u INR tomorrow AM.   Goal of Therapy:  Heparin level 0.3-0.7 units/ml Monitor platelets by anticoagulation protocol: Yes   Plan:  -Will start heparin gtt when INR < 2 -Daily INR   Nicole Cella, RPh Clinical Pharmacist Pager: [phone] 02/16/2016,9:19 AM

## 2016-02-16 NOTE — Progress Notes (Deleted)
Patient arrived on the unit via bed to room 5w26.  Patient is alert and oriented x 3.  No skin issues.  Educated the patient on the proper use of the phone to call the nurse. Lowered the bed. Set the bed alarm and placed call bell within reach

## 2016-02-16 NOTE — Progress Notes (Addendum)
Text page provider. Per patient's family patient less able to move today. He did have some trouble swallowing pills today. Awaiting orders and will continue to monitor. Betha Loa Queena Monrreal, RN   [phone]  Text paged provider again for pain med. Patients POA is at bedside requesting to speak to provider.  5:11 provider gave order for pain medication

## 2016-02-16 NOTE — Progress Notes (Signed)
[facility] PROGRESS NOTE  Terry Robertson. [id number] DOB: [birth year] DOA: 02/15/2016  PCP: [name], MD  Brief History/Interval Summary: 82 year old male with a past medical history of coronary artery disease status post stenting, myelodysplastic syndrome, atrial fibrillation on anticoagulation, history of seizures, brought into the hospital due to worsening confusion. Patient was also noted to have a cough ongoing for the last 2 weeks. Patient was suspected of having pneumonia. He was hospitalized for further management.  Reason for Visit: Community-acquired pneumonia and acute encephalopathy  Consultants: None  Procedures: None  Antibiotics: Ceftriaxone and azithromycin  Subjective/Interval History: Patient seems confused and distracted. Unable to answer any questions.  ROS: Unable to do due to confusion  Objective:  Vital Signs  Vitals:   02/15/16 2202 02/16/16 0227 02/16/16 1143 02/16/16 1402  BP: 136/62  138/65 120/67  Pulse: 100  (!) 120 (!) 114  Resp: 18   17  Temp: 100.3 F (37.9 C)   99.1 F (37.3 C)  TempSrc: Oral   Oral  SpO2: 96%  97% 97%  Weight:  113.2 kg (249 lb 9 oz)     No intake or output data in the 24 hours ending 02/16/16 1450 Filed Weights   02/16/16 0227  Weight: 113.2 kg (249 lb 9 oz)    General appearance: alert, appears stated age, distracted and no distress. Fatigued. Resp: Diminished air entry at the bases. No crackles, wheezing or rhonchi. Cardio: regular rate and rhythm, S1, S2 normal, no murmur, click, rub or gallop GI: soft, non-tender; bowel sounds normal; no masses,  no organomegaly Extremities: extremities normal, atraumatic, no cyanosis or edema Pulses: 2+ and symmetric Neurologic: Awake, alert. Distracted. No facial asymmetry. Moving all his extremities.  Lab Results:  Data Reviewed: I have personally reviewed following labs and imaging studies  CBC:  Recent Labs Lab 02/15/16 1744 02/15/16 2148  02/16/16 0446  WBC 5.5 6.6 7.2  NEUTROABS 0.8*  --   --   HGB 9.9* 9.2* 8.8*  HCT 32.5* 29.9* 28.9*  MCV 91.5 91.4 90.3  PLT 41* 35* 38*    Basic Metabolic Panel:  Recent Labs Lab 02/15/16 1744 02/16/16 0446  NA 138 139  K 3.7 4.0  CL 107 108  CO2 22 21*  GLUCOSE 110* 125*  BUN 11 10  CREATININE 1.03 1.13  CALCIUM 8.5* 7.9*    GFR: Estimated Creatinine Clearance: 55.6 mL/min (by C-G formula based on SCr of 1.13 mg/dL).  Liver Function Tests:  Recent Labs Lab 02/15/16 1744  AST 35  ALT 25  ALKPHOS 44  BILITOT 0.7  PROT 8.9*  ALBUMIN 2.8*    Coagulation Profile:  Recent Labs Lab 02/15/16 1815 02/16/16 0446  INR 3.08 2.72     Radiology Studies: Dg Chest 2 View  Result Date: 02/15/2016 CLINICAL DATA:  Initial evaluation for acute fever. Evaluate for pneumonia. EXAM: CHEST  2 VIEW COMPARISON:  Prior radiograph from 01/16/2016. FINDINGS: Stable cardiomegaly.  Mediastinal silhouette within normal limits. Lungs mildly hypoinflated. A left-sided pleural effusion is similar to previous. Subtly increased left basilar opacity, which may reflect atelectasis or infiltrate. No other focal airspace disease. No pneumothorax. No acute osseous abnormality. IMPRESSION: Persistent left pleural effusion with slightly increased left basilar opacity, which may reflect atelectasis or infiltrate. Electronically Signed   By: Jeannine Boga M.D.   On: 02/15/2016 18:07     Medications:  Scheduled: . aspirin EC  81 mg Oral Daily  . atorvastatin  20 mg Oral q1800  .  azithromycin  500 mg Intravenous Q24H  . cefTRIAXone (ROCEPHIN)  IV  1 g Intravenous Q24H  . diltiazem  180 mg Oral Daily  . feeding supplement (ENSURE ENLIVE)  237 mL Oral BID BM  . gabapentin  100 mg Oral Daily  . latanoprost  1 drop Both Eyes QHS  . levETIRAcetam  500 mg Oral Q12H  . methylPREDNISolone (SOLU-MEDROL) injection  20 mg Intravenous Daily  . metoprolol tartrate  25 mg Oral Daily  . mirabegron  ER  50 mg Oral Daily  . omega-3 acid ethyl esters  1 g Oral Daily  . vitamin C  1,000 mg Oral Daily   Continuous:  [id number] **OR** acetaminophen, ondansetron **OR** ondansetron (ZOFRAN) IV  Assessment/Plan:  Principal Problem:   CAP (community acquired pneumonia) Active Problems:   CAD PCI to LAD/CFX in 1997. Cath '08- medical Rx   Myelodysplastic syndrome [facility])   Essential hypertension   Diastolic dysfunction: Grade 2   History of likely cardioembolic stroke ([id number]) with onset of new diagnosis A. fib; expressive aphasia   PAF (paroxysmal atrial fibrillation) (HCC)   Pancytopenia (HCC)   Pneumonia   Normocytic anemia    Community-acquired pneumonia Continue with ceftriaxone and azithromycin. Follow up on cultures. Influenza PCR negative.  Acute encephalopathy  Most likely secondary to pneumonia. No evidence for focal neurological deficits.   CAD status post stenting Denies any chest pain. Patient is on Coumadin.  Anemia with history of myelodysplastic syndrome  Noted to have worsening hemoglobin. Stool is positive for occult blood. No overt bleeding has been noted. Continue to monitor hemoglobin. Coumadin has been placed on hold for now. May have to involve gastroenterology.   Myelodysplastic syndrome See above. Patient also noted to have thrombocytopenia. Continue to monitor closely.  History of seizures Continue Keppra. No recent seizures noted as per the family.  Paroxysmal atrial fibrillation Chads2 vasc score more than 2. Patient is on Cardizem and metoprolol. Patient was also on warfarin. Held for now due to his anemia.  Microscopic Hematuria Check urine cultures. May need further workup as outpatient.  DVT Prophylaxis: INR is therapeutic.    Code Status: Full code  Family Communication: No family at bedside  Disposition Plan: PT and OT evaluation. Continue management as outlined above.     LOS: 1 day   [place]  [facility] Pager [phone] 02/16/2016, 2:50 PM  If 7PM-7AM, please contact night-coverage at [email or web address], password [facility]

## 2016-02-16 NOTE — Care Management Note (Addendum)
Case Management Note  Patient Details  Name: Terry Robertson. MRN: [id number] Date of Birth: May 10, 1928  Subjective/Objective:        Admitted with CAP, hx of  CAD status post stenting, myelodysplastic syndrome, multiple myeloma, atrial fibrillation, seizures, CVA/expressive aphasia. From home with 2 [phone] and [name], [phone]). [name] states he is responsible for taking dad to all MD visits. HCPOA is pt's daughter, [name]. Sons states required min. assistance  PTA with ADL'S and used a cane with ambulation.     [name] (Daughter)      [phone]          PCP: [name]  Action/Plan: [facility] consult place per CM. PT/OT evaluations pending ....... CM to f/u with disposition needs.  Expected Discharge Date:                  Expected Discharge Plan:  [place]  In-House Referral:  Clinical Social Work  Discharge planning Services  CM Consult  Post Acute Care Choice:  Resumption of Svcs/PTA Provider Choice offered to:  Adult Children  DME Arranged:   pt with home oxygen prn, active with [facility]. DME Agency:   [facility]  HH Arranged:   PT HH Agency:  [facility]/ CM made [facility] aware pt is hospitalized. Sons @ bedside informed CM pt is active with [facility] for home health services (PT).  Status of Service:  In process, will continue to follow  If discussed at Long Length of Stay Meetings, dates discussed:    Additional Comments:  Sharin Mons, RN 02/16/2016, 12:27 PM

## 2016-02-17 ENCOUNTER — Other Ambulatory Visit: Payer: Self-pay | Admitting: *Deleted

## 2016-02-17 DIAGNOSIS — R195 Other fecal abnormalities: Secondary | ICD-10-CM

## 2016-02-17 LAB — LEGIONELLA PNEUMOPHILA SEROGP 1 UR AG: L. PNEUMOPHILA SEROGP 1 UR AG: NEGATIVE

## 2016-02-17 LAB — URINE CULTURE: CULTURE: NO GROWTH

## 2016-02-17 LAB — CBC
HCT: 30.3 % — ABNORMAL LOW (ref 39.0–52.0)
HEMOGLOBIN: 9.1 g/dL — AB (ref 13.0–17.0)
MCH: 27.3 pg (ref 26.0–34.0)
MCHC: 30 g/dL (ref 30.0–36.0)
MCV: 91 fL (ref 78.0–100.0)
Platelets: 45 10*3/uL — ABNORMAL LOW (ref 150–400)
RBC: 3.33 MIL/uL — ABNORMAL LOW (ref 4.22–5.81)
RDW: 19.3 % — ABNORMAL HIGH (ref 11.5–15.5)
WBC: 9.9 10*3/uL (ref 4.0–10.5)

## 2016-02-17 LAB — BASIC METABOLIC PANEL
Anion gap: 9 (ref 5–15)
BUN: 16 mg/dL (ref 6–20)
CHLORIDE: 105 mmol/L (ref 101–111)
CO2: 24 mmol/L (ref 22–32)
CREATININE: 1.32 mg/dL — AB (ref 0.61–1.24)
Calcium: 8 mg/dL — ABNORMAL LOW (ref 8.9–10.3)
GFR calc Af Amer: 55 mL/min — ABNORMAL LOW (ref >60)
GFR calc non Af Amer: 47 mL/min — ABNORMAL LOW (ref >60)
GLUCOSE: 205 mg/dL — AB (ref 65–99)
Potassium: 4 mmol/L (ref 3.5–5.1)
SODIUM: 138 mmol/L (ref 135–145)

## 2016-02-17 LAB — PROTIME-INR
INR: 2.11
PROTHROMBIN TIME: 24 s — AB (ref 11.4–15.2)

## 2016-02-17 MED ORDER — SODIUM CHLORIDE 0.9 % IV SOLN
INTRAVENOUS | Status: AC
  Administered 2016-02-17: 10:00:00 via INTRAVENOUS

## 2016-02-17 NOTE — Evaluation (Signed)
Clinical/Bedside Swallow Evaluation Patient Details  Name: Terry Robertson. MRN: [id number] Date of Birth: March 04, 1929  Today's Date: 02/17/2016 Time: SLP Start Time (ACUTE ONLY): 1545 SLP Stop Time (ACUTE ONLY): 1555 SLP Time Calculation (min) (ACUTE ONLY): 10 min  Past Medical History:  Past Medical History:  Diagnosis Date  . Aortic stenosis, mild 08/10/2014   : EF 60-65%, mod Conc LVH, cannot assess Diastolic Fxn.  Mild AS. Mild MVP without MR., Mod LA/RA dilation.  . Asbestosis [facility])    family unclear where he was evaluated in the past  . BPH (benign prostatic hyperplasia)    TURP in 12/2010  . CAD S/P percutaneous coronary angioplasty 1997   status post remote PCI to the LAD and circumflex in 1997, heart cath in 2008 showed widely patent stents in the circumflex and LAD.  RCA had a mid lesion and then was totally occluded distally.  . CHF (congestive heart failure) ([place])   . Diabetes mellitus   . Essential hypertension   . MGUS (monoclonal gammopathy of unknown significance) 09/21/2014  . Multiple myeloma ([place]) 09/29/2014  . Myelodysplastic syndrome (HCC)    w/mild anemia an neutropenia  . Obesity, morbid ([place])    BMI  . OSA on CPAP   . Paroxysmal atrial fibrillation (HCC)    on coumadin; Diagnosed at the time of his 1st CVA.  CHA2DS2VASC = 6. On  Warfarin  . Seizures ([place])    Post CVA; on Keppra; stable  . ST-segment elevation myocardial infarction (STEMI) of inferior wall ([place]) 1997   inferior MI with left circumflex stent  . Stroke ([place]) 08/2011, 03/2012   L MCA (Afib RVR while admitted for hypotension) - expressive aphasia (almost totally recovered)   Past Surgical History:  Past Surgical History:  Procedure Laterality Date  . CARDIAC CATHETERIZATION  02/08/07   widely patent stent in the circumflex and LAD.  RCA had a mid lesion and then was totally occluded distally. Cook bare-metal 3.5x20 mm stent  . NM MYOVIEW LTD  01/09/1999   Inferior wall thinning with possible mild  ischemia versus infarct. This would correlate well with his known RCA occlusion  . TRANSTHORACIC ECHOCARDIOGRAM  08/06/11; 08/2014   a) EF 55% to 65% with grade2 DD/pseudonormal w/ mildly dilated LA with high LAP/LVEDP; b) EF 60-65%, mod Conc LVH, cannot assess Diastolic Fxn.  Mild AS. Mild MVP without MR., Mod LA/RA dilation.  . TRANSURETHRAL RESECTION OF PROSTATE  12/2010   HPI:  82 year old male with a past medical history of coronary artery disease status post stenting, myelodysplastic syndrome, atrial fibrillation on anticoagulation, history of seizures, brought into the hospital due to worsening confusion. Patient was also noted to have a cough ongoing for the last 2 weeks. Patient was suspected of having pneumonia. Hx CVA (2013, 2014), seizures, myelodysplastic syndrome, MI, pneumothorax, diabetes, CHF. Per RN report, patient with difficulty swallowing pills this morning. Has been followed by SLP during previous admissions, has a remote history of dysphagia; last swallow eval dated 08/10/14 with normal findings.   Assessment / Plan / Recommendation Clinical Impression  Pt presents with normal oropharyngeal swallow function with adequate mastication, brisk swallow response, and no overt s/s of aspiration.  D/W pt's dtr the value of giving meds with puree should pt have recurring difficulty consuming pills with water.  No SLP f/u is warranted.      Aspiration Risk  No limitations    Diet Recommendation   regular diet, thin liquids  Medication Administration: Whole meds with  liquid    Other  Recommendations Oral Care Recommendations: Oral care BID   Follow up Recommendations        Frequency and Duration            Prognosis        Swallow Study   General HPI: 82 year old male with a past medical history of coronary artery disease status post stenting, myelodysplastic syndrome, atrial fibrillation on anticoagulation, history of seizures, brought into the hospital due to worsening  confusion. Patient was also noted to have a cough ongoing for the last 2 weeks. Patient was suspected of having pneumonia. Hx CVA (2013, 2014), seizures, myelodysplastic syndrome, MI, pneumothorax, diabetes, CHF. Per RN report, patient with difficulty swallowing pills this morning. Has been followed by SLP during previous admissions, has a remote history of dysphagia; last swallow eval dated 08/10/14 with normal findings. Type of Study: Bedside Swallow Evaluation Previous Swallow Assessment: see HPI Diet Prior to this Study: Regular;Thin liquids Temperature Spikes Noted: No Respiratory Status: Room air History of Recent Intubation: No Behavior/Cognition: Alert Oral Cavity Assessment: Within Functional Limits Oral Care Completed by SLP: No Oral Cavity - Dentition: Missing dentition Vision: Functional for self-feeding Self-Feeding Abilities: Able to feed self Patient Positioning: Upright in chair Baseline Vocal Quality: Normal Volitional Cough: Strong Volitional Swallow: Able to elicit    Oral/Motor/Sensory Function Overall Oral Motor/Sensory Function: Within functional limits   Ice Chips Ice chips: Within functional limits   Thin Liquid Thin Liquid: Within functional limits Presentation: Cup    Nectar Thick Nectar Thick Liquid: Not tested   Honey Thick Honey Thick Liquid: Not tested   Puree Puree: Within functional limits   Solid   GO   Solid: Within functional limits       Lyrah Bradt L. Tivis Ringer, [place] CCC/SLP Pager [phone]  Juan Quam Laurice 02/17/2016,4:19 PM

## 2016-02-17 NOTE — NC FL2 (Signed)
[place] LEVEL OF CARE SCREENING TOOL     IDENTIFICATION  Patient Name: Terry Robertson. Birthdate: [birth year] Sex: male Admission Date (Current Location): 02/15/2016  [facility] and [place] Number:  Herbalist and Address:  [facility] [place] [address] [place], [place] [address]      Provider Number: [id number]  Attending Physician Name and Address:  Bonnielee Haff, MD  Relative Name and Phone Number:  [name], daughter, [phone]    Current Level of Care: Hospital Recommended Level of Care: [place] Prior Approval Number:    Date Approved/Denied:   PASRR Number: [id number]  Discharge Plan: SNF    Current Diagnoses: Patient Active Problem List   Diagnosis Date Noted  . CAP (community acquired pneumonia) 02/15/2016  . Pneumonia 02/15/2016  . Normocytic anemia 02/15/2016  . Thrombocytopenia ([place]) 11/04/2015  . Poor dentition 07/23/2015  . Multiple myeloma in remission ([place]) 07/22/2015  . Pancytopenia ([place]) 12/29/2014  . Multiple myeloma ([place]) 09/29/2014  . MGUS (monoclonal gammopathy of unknown significance) 09/21/2014  . Left ventricular diastolic dysfunction, NYHA class 2 (grade 2 dysfunction with elevated filling pressures) 10/01/2013  . Hemothorax, left 06/25/2013  . Severe obesity (BMI >= 40) ([place]) 03/09/2013  . PAF (paroxysmal atrial fibrillation) ([place]) 03/09/2013  . Long term (current) use of anticoagulants 05/21/2012  . CVA (cerebral infarction) 03/28/2012  . History of likely cardioembolic stroke (09/10/2421) with onset of new diagnosis A. fib; expressive aphasia 08/09/2011  . Diastolic dysfunction: Grade 2 08/07/2011  . Pancytopenia, acquired ([place]) 08/07/2011  . CAD PCI to LAD/CFX in 1997. Cath '08- medical Rx 08/05/2011  . Myelodysplastic syndrome ([place]) 08/05/2011  . Dyslipidemia, goal LDL below 70 08/05/2011  . Essential hypertension 08/05/2011  . S/P TURP Oct 2012 08/05/2011  . DM  type 2 (diabetes mellitus, type 2) ([place]) 08/05/2011    Orientation RESPIRATION BLADDER Height & Weight     Self, Place  Normal Continent Weight: 110 kg (242 lb 8.1 oz) Height:     BEHAVIORAL SYMPTOMS/MOOD NEUROLOGICAL BOWEL NUTRITION STATUS      Continent Diet (Please see DC Summary)  AMBULATORY STATUS COMMUNICATION OF NEEDS Skin   Extensive Assist Verbally Normal                       Personal Care Assistance Level of Assistance  Bathing, Feeding, Dressing Bathing Assistance: Maximum assistance Feeding assistance: Independent Dressing Assistance: Limited assistance     Functional Limitations Info             SPECIAL CARE FACTORS FREQUENCY  PT (By licensed PT), OT (By licensed OT)     PT Frequency: 5x/week OT Frequency: 3x/week            Contractures      Additional Factors Info  Code Status, Allergies Code Status Info: Full Allergies Info: NKA           Current Medications (02/17/2016):  This is the current hospital active medication list Current Facility-Administered Medications  Medication Dose Route Frequency Provider Last Rate Last Dose  . acetaminophen (TYLENOL) tablet 650 mg  650 mg Oral Q6H PRN Rise Patience, MD   650 mg at 02/16/16 1554   Or  . acetaminophen (TYLENOL) suppository 650 mg  650 mg Rectal Q6H PRN Rise Patience, MD      . aspirin EC tablet 81 mg  81 mg Oral Daily Rise Patience, MD   81 mg at  02/16/16 1148  . atorvastatin (LIPITOR) tablet 20 mg  20 mg Oral q1800 Rise Patience, MD   20 mg at 02/16/16 2006  . azithromycin (ZITHROMAX) 500 mg in dextrose 5 % 250 mL IVPB  500 mg Intravenous Q24H Bonnielee Haff, MD   500 mg at 02/16/16 2006  . cefTRIAXone (ROCEPHIN) 1 g in dextrose 5 % 50 mL IVPB  1 g Intravenous Q24H Bonnielee Haff, MD   1 g at 02/16/16 1857  . diltiazem (CARDIZEM CD) 24 hr capsule 180 mg  180 mg Oral Daily Rise Patience, MD   180 mg at 02/16/16 1147  . feeding supplement (ENSURE ENLIVE)  (ENSURE ENLIVE) liquid 237 mL  237 mL Oral BID BM Rise Patience, MD   237 mL at 02/16/16 1435  . gabapentin (NEURONTIN) capsule 100 mg  100 mg Oral Daily Rise Patience, MD   100 mg at 02/16/16 1148  . HYDROcodone-acetaminophen (NORCO/VICODIN) 5-325 MG per tablet 1 tablet  1 tablet Oral BID PRN Bonnielee Haff, MD      . latanoprost (XALATAN) 0.005 % ophthalmic solution 1 drop  1 drop Both Eyes QHS Rise Patience, MD   1 drop at 02/16/16 2143  . levETIRAcetam (KEPPRA) tablet 500 mg  500 mg Oral Q12H Rise Patience, MD   500 mg at 02/16/16 2143  . methylPREDNISolone sodium succinate (SOLU-MEDROL) 40 mg/mL injection 20 mg  20 mg Intravenous Daily Rise Patience, MD   20 mg at 02/16/16 1435  . metoprolol tartrate (LOPRESSOR) tablet 25 mg  25 mg Oral Daily Rise Patience, MD   25 mg at 02/16/16 1148  . mirabegron ER (MYRBETRIQ) tablet 50 mg  50 mg Oral Daily Rise Patience, MD   50 mg at 02/16/16 1147  . omega-3 acid ethyl esters (LOVAZA) capsule 1 g  1 g Oral Daily Rise Patience, MD   1 g at 02/16/16 1147  . ondansetron (ZOFRAN) tablet 4 mg  4 mg Oral Q6H PRN Rise Patience, MD       Or  . ondansetron [facility]) injection 4 mg  4 mg Intravenous Q6H PRN Rise Patience, MD      . vitamin C (ASCORBIC ACID) tablet 1,000 mg  1,000 mg Oral Daily Rise Patience, MD   1,000 mg at 02/16/16 1148     Discharge Medications: Please see discharge summary for a list of discharge medications.  Relevant Imaging Results:  Relevant Lab Results:   Additional Information SSN: [place], [place]

## 2016-02-17 NOTE — Progress Notes (Signed)
[facility] PROGRESS NOTE  Terry Robertson. [id number] DOB: 1928/07/11 DOA: 02/15/2016  PCP: [name], MD  Brief History/Interval Summary: 82 year old male with a past medical history of coronary artery disease status post stenting, myelodysplastic syndrome, atrial fibrillation on anticoagulation, history of seizures, brought into the hospital due to worsening confusion. Patient was also noted to have a cough ongoing for the last 2 weeks. Patient was suspected of having pneumonia. He was hospitalized for further management.  Reason for Visit: Community-acquired pneumonia and acute encephalopathy  Consultants: Gastroenterology  Procedures: None  Antibiotics: Ceftriaxone and azithromycin  Subjective/Interval History: Patient feels much of this morning. States that his breathing has improved. Still appears to be somewhat distracted.  ROS: Denies any nausea or vomiting  Objective:  Vital Signs  Vitals:   02/16/16 1645 02/16/16 2218 02/17/16 0624 02/17/16 0625  BP:  116/67  124/72  Pulse: (!) 115 98  95  Resp:  18    Temp:  97.4 F (36.3 C)  97.9 F (36.6 C)  TempSrc:  Oral  Oral  SpO2: 91% 97%  97%  Weight:   110 kg (242 lb 8.1 oz)     Intake/Output Summary (Last 24 hours) at 02/17/16 0922 Last data filed at 02/17/16 [id number]  Gross per 24 hour  Intake              370 ml  Output              250 ml  Net              120 ml   Filed Weights   02/16/16 0227 02/17/16 0624  Weight: 113.2 kg (249 lb 9 oz) 110 kg (242 lb 8.1 oz)    General appearance: alert, appears stated age, distracted and no distress. Fatigued. Resp: Diminished air entry at the bases. No crackles, wheezing or rhonchi. Cardio: regular rate and rhythm, S1, S2 normal, no murmur, click, rub or gallop GI: soft, non-tender; bowel sounds normal; no masses,  no organomegaly Neurologic: Awake, alert. Distracted. No facial asymmetry. Moving all his extremities.  Lab Results:  Data Reviewed: I  have personally reviewed following labs and imaging studies  CBC:  Recent Labs Lab 02/15/16 1744 02/15/16 2148 02/16/16 0446 02/17/16 0328  WBC 5.5 6.6 7.2 9.9  NEUTROABS 0.8*  --   --   --   HGB 9.9* 9.2* 8.8* 9.1*  HCT 32.5* 29.9* 28.9* 30.3*  MCV 91.5 91.4 90.3 91.0  PLT 41* 35* 38* 45*    Basic Metabolic Panel:  Recent Labs Lab 02/15/16 1744 02/16/16 0446 02/17/16 0328  NA 138 139 138  K 3.7 4.0 4.0  CL 107 108 105  CO2 22 21* 24  GLUCOSE 110* 125* 205*  BUN 11 10 16   CREATININE 1.03 1.13 1.32*  CALCIUM 8.5* 7.9* 8.0*    GFR: Estimated Creatinine Clearance: 46.8 mL/min (by C-G formula based on SCr of 1.32 mg/dL (H)).  Liver Function Tests:  Recent Labs Lab 02/15/16 1744  AST 35  ALT 25  ALKPHOS 44  BILITOT 0.7  PROT 8.9*  ALBUMIN 2.8*    Coagulation Profile:  Recent Labs Lab 02/15/16 1815 02/16/16 0446 02/17/16 0328  INR 3.08 2.72 2.11     Radiology Studies: Dg Chest 2 View  Result Date: 02/15/2016 CLINICAL DATA:  Initial evaluation for acute fever. Evaluate for pneumonia. EXAM: CHEST  2 VIEW COMPARISON:  Prior radiograph from 01/16/2016. FINDINGS: Stable cardiomegaly.  Mediastinal silhouette within normal limits. Lungs mildly  hypoinflated. A left-sided pleural effusion is similar to previous. Subtly increased left basilar opacity, which may reflect atelectasis or infiltrate. No other focal airspace disease. No pneumothorax. No acute osseous abnormality. IMPRESSION: Persistent left pleural effusion with slightly increased left basilar opacity, which may reflect atelectasis or infiltrate. Electronically Signed   By: Jeannine Boga M.D.   On: 02/15/2016 18:07     Medications:  Scheduled: . aspirin EC  81 mg Oral Daily  . atorvastatin  20 mg Oral q1800  . azithromycin  500 mg Intravenous Q24H  . cefTRIAXone (ROCEPHIN)  IV  1 g Intravenous Q24H  . diltiazem  180 mg Oral Daily  . feeding supplement (ENSURE ENLIVE)  237 mL Oral BID BM    . gabapentin  100 mg Oral Daily  . latanoprost  1 drop Both Eyes QHS  . levETIRAcetam  500 mg Oral Q12H  . methylPREDNISolone (SOLU-MEDROL) injection  20 mg Intravenous Daily  . metoprolol tartrate  25 mg Oral Daily  . mirabegron ER  50 mg Oral Daily  . omega-3 acid ethyl esters  1 g Oral Daily  . vitamin C  1,000 mg Oral Daily   Continuous: . sodium chloride     [id number] **OR** acetaminophen, HYDROcodone-acetaminophen, ondansetron **OR** ondansetron (ZOFRAN) IV  Assessment/Plan:  Principal Problem:   CAP (community acquired pneumonia) Active Problems:   CAD PCI to LAD/CFX in 1997. Cath '08- medical Rx   [facility]elodysplastic syndrome [facility])   Essential hypertension   Diastolic dysfunction: Grade 2   History of likely cardioembolic stroke ([id number]) with onset of new diagnosis A. fib; expressive aphasia   PAF (paroxysmal atrial fibrillation) (HCC)   Pancytopenia (HCC)   Pneumonia   Normocytic anemia    Community-acquired pneumonia Continue with ceftriaxone and azithromycin. Follow up on cultures. Influenza PCR negative.  Acute encephalopathy  Most likely secondary to pneumonia. No evidence for focal neurological deficits. Seems to be improving slowly.  CAD status post stenting Denies any chest pain. Patient is on Coumadin at home.  Anemia with history of myelodysplastic syndrome  Noted to have worsening hemoglobin. Stool is positive for occult blood. No overt bleeding has been noted. Continue to monitor hemoglobin. Coumadin has been placed on hold for now. Will discuss with gastroenterology regarding need for repeat study before he is placed back on anticoagulation. He has had colonoscopy done by Dr. Collene Mares previously.   Myelodysplastic syndrome with anemia and thrombocytopenia See above. Patient also noted to have thrombocytopenia. Continue to monitor closely.  Mildly elevated creatinine. Possibly due to poor oral intake. Give gentle IV hydration for 12 hours. Repeat  labs tomorrow.  History of seizures Continue Keppra.   Paroxysmal atrial fibrillation Chads2 vasc score more than 2. Patient is on Cardizem and metoprolol. Patient was also on warfarin. Held for now due to his anemia and positive stool for occult blood.  Microscopic Hematuria No growth on urine cultures. May need further workup as outpatient.  DVT Prophylaxis: INR is therapeutic.    Code Status: Full code  Family Communication: No family at bedside  Disposition Plan: PT and OT evaluation. Continue management as outlined above.     LOS: 2 days   [place] [facility] Pager [phone] 02/17/2016, 9:22 AM  If 7PM-7AM, please contact night-coverage at [email or web address], password [facility]

## 2016-02-17 NOTE — Progress Notes (Signed)
Agree that SNF rehab is most appropriate. I will sign off at this time. [id number]

## 2016-02-17 NOTE — Consult Note (Signed)
   Terry Robertson CM Inpatient Consult   02/17/2016  [name]. May 12, 1928 [id number]    [facility] Care Management follow up on referral received from inpatient RNCM. Patient's nurse reports Mr. Mittelman is slightly confused. Patient's nurse summoned Robertson liaison to make aware that Mr. Forino' daughter was in the room.   Spoke with Tivis Ringer, daughter and Chauncey Reading, about [place] Management program. Written consents for post Robertson discharge. He has a history of CVA, seizures, DM, and CHF. He only has 1 admit but it was thought per inpatient RNCM that Mr. Suderman could benefit from additional follow up. Written consent obtained.  Mr. Volz lives with his sons. Enid Derry lives in [place]. Denies having issues with affording medications or with transportation. Primary Care MD is Dr. Baird Cancer.  [name] (son), whom patient lives with should be called for post discharge calls at [phone].   Marthenia Rolling, MSN-Ed, RN,BSN [facility] Liaison [phone]

## 2016-02-17 NOTE — Progress Notes (Signed)
Physical Therapy Treatment Patient Details Name: Terry Robertson. MRN: [id number] DOB: 20-May-1928 Today's Date: 02/17/2016    History of Present Illness Pt is an 82 y/o male who presents with AMS. PMH includes CVA, seizures, diabetes, CHF.    PT Comments    Patient with significantly improved cognition, however continues to have decr awareness of deficits. He was able to follow commands and move OOB to chair with 2 person assist. Patient easily fatigues and required seated rest before attempting ambulation with RW. Patient able to walk 5 ft (limited by weakness and room constraints). Patient with visible shaking/tremors bil UE and LE as he fatigued and relieved when returned to sitting. He states this is not typical for him. Unclear if pt will have 24 hr supervision at home.   Follow Up Recommendations  CIR     Equipment Recommendations  None recommended by PT    Recommendations for Other Services Rehab consult     Precautions / Restrictions Precautions Precautions: Fall Restrictions Weight Bearing Restrictions: No    Mobility  Bed Mobility Overal bed mobility: Needs Assistance Bed Mobility: Supine to Sit     Supine to sit: Mod assist     General bed mobility comments: HOB flat, +rail; appears pt uses momentum to come to sit, however too weak currently  Transfers Overall transfer level: Needs assistance Equipment used: 2 person hand held assist;Rolling walker (2 wheeled) Transfers: Sit to/from [facility] Sit to Stand: Min assist;+2 physical assistance;+2 safety/equipment Stand pivot transfers: Min assist;+2 physical assistance;+2 safety/equipment       General transfer comment: from EOB attempted to simulate cane with Rt HHA; pt with difficulty advancing his feet to step/pivot to chair until 2nd person assisted with Lt HHA; from recliner stood with min assist to RW  Ambulation/Gait Ambulation/Gait assistance: Min assist;+2  safety/equipment Ambulation Distance (Feet): 5 Feet Assistive device: Rolling walker (2 wheeled) Gait Pattern/deviations: Step-through pattern;Decreased stride length;Trunk flexed;Shuffle   Gait velocity interpretation: Below normal speed for age/gender General Gait Details: states his flexed posture is due to back pain    Stairs            Wheelchair Mobility    Modified Rankin (Stroke Patients Only) Modified Rankin (Stroke Patients Only) Pre-Morbid Rankin Score: No significant disability Modified Rankin: Moderately severe disability     Balance Overall balance assessment: Needs assistance Sitting-balance support: No upper extremity supported;Feet supported Sitting balance-Leahy Scale: Fair Sitting balance - Comments: sat EOB x 3 minutes for dizziness to improve   Standing balance support: Bilateral upper extremity supported Standing balance-Leahy Scale: Poor                      Cognition Arousal/Alertness: Awake/alert Behavior During Therapy: Flat affect Overall Cognitive Status: No family/caregiver present to determine baseline cognitive functioning Area of Impairment: Attention;Following commands;Safety/judgement;Awareness;Problem solving Orientation Level:  (ox3; time NT) Current Attention Level: Selective   Following Commands: Follows one step commands with increased time Safety/Judgement: Decreased awareness of safety;Decreased awareness of deficits Awareness: Emergent Problem Solving: Slow processing;Decreased initiation;Difficulty sequencing;Requires verbal cues;Requires tactile cues General Comments: unable to problem-solve how to get OOB and required vc with assist (due to weakness);     Exercises      General Comments        Pertinent Vitals/Pain Pain Assessment: No/denies pain    Home Living Family/patient expects to be discharged to:: Private residence Living Arrangements: Children Available Help at Discharge: Family;Available 24  hours/day Type of Home: House  Home Access: Stairs to enter Entrance Stairs-Rails: Right;Can reach both;Left Home Layout: One level Home Equipment: Walker - 2 wheels;Shower seat;Cane - single point      Prior Function Level of Independence: Independent with assistive device(s)      Comments: Girlfriend reports that he is usually independent with ADL's but used the cane most of the time for ambulation. Occasionally uses the walker.    PT Goals (current goals can now be found in the care plan section) Acute Rehab PT Goals Patient Stated Goal: Pt did not state goals Time For Goal Achievement: 03/01/16 Progress towards PT goals: Progressing toward goals    Frequency    Min 3X/week      PT Plan Discharge plan needs to be updated    Co-evaluation             End of Session Equipment Utilized During Treatment: Gait belt Activity Tolerance: Patient tolerated treatment well (weakness) Patient left: with call bell/phone within reach;in chair;with chair alarm set     Time: [id number] PT Time Calculation (min) (ACUTE ONLY): 19 min  Charges:  $Gait Training: 8-22 mins                    G CodesJeanie Cooks Landi Biscardi 02-26-16, 9:09 AM Pager [phone]

## 2016-02-17 NOTE — Progress Notes (Signed)
CSW received consult regarding PT recommendation of SNF at discharge.  Patient and patient daughter are refusing SNF and would like home health.  CSW signing off.   Percell Locus Branae Crail LCSWA [phone]

## 2016-02-17 NOTE — Consult Note (Signed)
[facility] CM Primary Care Navigator  02/17/2016  Terry Po Jr. 10/26/1928 [id number]  Met with patient at the bedside to identify possible discharge needs. Patient reports that son brought him to the hospital due to worsening confusion and "was not looking good" which had led to this admission.  Patient endorses Dr. Robyn Sanders with [facility] as the primary care provider.    Patient shared using [facility] pharmacy at [address] and [facility] pharmacy to obtain medications with no problems.   Patient's son (Terry Robertson) manages his medications at home using "pill box" system as stated.  Son provides transportation to his doctors' appointments.  He reports that son (Terry Robertson- lives with patient) and is his primary caregiver at home.   Discharge plan is to go home per patient. He has home health services per [facility]. He reports that his daughter from [place] came over to see him.  Patient voiced understanding to call primary care provider's office once he gets home, for a post discharge follow-up appointment within a week or sooner if needed. Patient letter provide as a reminder.  He denies further needs or concerns at this time. [facility] care management contact information provided for future needs that may arise.  For additional questions please contact:  Lorraine A. Ajel, BSN, RN-BC [facility] PRIMARY CARE Navigator Cell: [phone] 

## 2016-02-17 NOTE — Clinical Social Work Note (Signed)
Clinical Social Work Assessment  Patient Details  Name: Terry Robertson. MRN: [id number] Date of Birth: [birth year]  Date of referral:  02/17/16               Reason for consult:  Facility Placement                Permission sought to share information with:  Facility Sport and exercise psychologist, Family Supports Permission granted to share information::  No (Patient disoriented)  Name::     Terry Robertson; [place]::  SNFs  Relationship::  Son; Sales executive Information:     Housing/Transportation Living arrangements for the past 2 months:  Single Family Home Source of Information:  Adult Children Patient Interpreter Needed:  None Criminal Activity/Legal Involvement Pertinent to Current Situation/Hospitalization:  No - Comment as needed Significant Relationships:  Adult Children Lives with:  Adult Children Do you feel safe going back to the place where you live?  No Need for family participation in patient care:  Yes (Comment)  Care giving concerns:  CSW received consult for possible SNF placement at time of discharge. Patient is disoriented. CSW spoke with patient's son, Terry Robertson, regarding PT recommendation of SNF placement at time of discharge. Per Terry Robertson, patient's family is currently unable to care for patient at their home given patient's current physical needs and fall risk. Terry Robertson also suggested that Uncertain speak with patient's daughter and estate executor, Terry Robertson. CSW left her a voicemail. Patient's son expressed understanding of PT recommendation and is agreeable to SNF placement at time of discharge. CSW to continue to follow and assist with discharge planning needs.   Social Worker assessment / plan:  CSW spoke with patient's son concerning possibility of rehab at [facility] before returning home.  Employment status:  Retired Nurse, adult PT Recommendations:  [place] / Referral to community resources:  [place]  Patient/Family's Response to care:  Patient's son recognizes need for rehab before returning home and is agreeable to a SNF in [place].   Patient/Family's Understanding of and Emotional Response to Diagnosis, Current Treatment, and Prognosis:  Patient/family is realistic regarding therapy needs and expressed being hopeful for SNF placement. Patient's son expressed understanding of CSW role and discharge process. No questions/concerns about plan or treatment.    Emotional Assessment Appearance:  Appears stated age Attitude/Demeanor/Rapport:  Unable to Assess Affect (typically observed):  Unable to Assess Orientation:  Oriented to Self, Oriented to Place Alcohol / Substance use:  Not Applicable Psych involvement (Current and /or in the community):  No (Comment)  Discharge Needs  Concerns to be addressed:  Care Coordination Readmission within the last 30 days:  No Current discharge risk:  None Barriers to Discharge:  Continued Medical Work up   [employer], [place] 02/17/2016, 8:29 AM

## 2016-02-18 ENCOUNTER — Ambulatory Visit: Payer: Commercial Managed Care - HMO | Admitting: Hematology and Oncology

## 2016-02-18 ENCOUNTER — Other Ambulatory Visit: Payer: Commercial Managed Care - HMO

## 2016-02-18 LAB — CBC
HCT: 27.2 % — ABNORMAL LOW (ref 39.0–52.0)
Hemoglobin: 8.3 g/dL — ABNORMAL LOW (ref 13.0–17.0)
MCH: 27.1 pg (ref 26.0–34.0)
MCHC: 30.5 g/dL (ref 30.0–36.0)
MCV: 88.9 fL (ref 78.0–100.0)
PLATELETS: 55 10*3/uL — AB (ref 150–400)
RBC: 3.06 MIL/uL — ABNORMAL LOW (ref 4.22–5.81)
RDW: 18.9 % — AB (ref 11.5–15.5)
WBC: 12.7 10*3/uL — AB (ref 4.0–10.5)

## 2016-02-18 LAB — BASIC METABOLIC PANEL
Anion gap: 7 (ref 5–15)
BUN: 29 mg/dL — ABNORMAL HIGH (ref 6–20)
CALCIUM: 8.2 mg/dL — AB (ref 8.9–10.3)
CO2: 26 mmol/L (ref 22–32)
Chloride: 106 mmol/L (ref 101–111)
Creatinine, Ser: 1.4 mg/dL — ABNORMAL HIGH (ref 0.61–1.24)
GFR calc Af Amer: 51 mL/min — ABNORMAL LOW (ref >60)
GFR, EST NON AFRICAN AMERICAN: 44 mL/min — AB (ref >60)
GLUCOSE: 162 mg/dL — AB (ref 65–99)
Potassium: 4.1 mmol/L (ref 3.5–5.1)
SODIUM: 139 mmol/L (ref 135–145)

## 2016-02-18 LAB — PROTIME-INR
INR: 3.05
PROTHROMBIN TIME: 32.2 s — AB (ref 11.4–15.2)

## 2016-02-18 MED ORDER — AZITHROMYCIN 500 MG PO TABS
500.0000 mg | ORAL_TABLET | Freq: Every day | ORAL | Status: DC
  Administered 2016-02-19: 500 mg via ORAL
  Filled 2016-02-18 (×2): qty 1

## 2016-02-18 MED ORDER — SODIUM CHLORIDE 0.9 % IV SOLN
INTRAVENOUS | Status: AC
  Administered 2016-02-18: 17:00:00 via INTRAVENOUS
  Administered 2016-02-19: 1000 mL via INTRAVENOUS

## 2016-02-18 MED ORDER — PREDNISONE 20 MG PO TABS
20.0000 mg | ORAL_TABLET | Freq: Every day | ORAL | Status: DC
  Administered 2016-02-19: 20 mg via ORAL
  Filled 2016-02-18: qty 1

## 2016-02-18 MED ORDER — METOPROLOL TARTRATE 25 MG PO TABS
25.0000 mg | ORAL_TABLET | Freq: Two times a day (BID) | ORAL | Status: DC
  Administered 2016-02-18 – 2016-02-19 (×2): 25 mg via ORAL
  Filled 2016-02-18 (×2): qty 1

## 2016-02-18 MED ORDER — METOPROLOL TARTRATE 5 MG/5ML IV SOLN
2.5000 mg | Freq: Once | INTRAVENOUS | Status: DC
  Filled 2016-02-18: qty 5

## 2016-02-18 MED ORDER — CEFPODOXIME PROXETIL 200 MG PO TABS
200.0000 mg | ORAL_TABLET | Freq: Two times a day (BID) | ORAL | Status: DC
  Administered 2016-02-18 – 2016-02-19 (×3): 200 mg via ORAL
  Filled 2016-02-18 (×4): qty 1

## 2016-02-18 NOTE — Progress Notes (Signed)
[facility] PROGRESS NOTE  Terry Robertson. [id number] DOB: 01/19/1929 DOA: 02/15/2016  PCP: [name], MD  Brief History/Interval Summary: 82 year old male with a past medical history of coronary artery disease status post stenting, myelodysplastic syndrome, atrial fibrillation on anticoagulation, history of seizures, brought into the hospital due to worsening confusion. Patient was also noted to have a cough ongoing for the last 2 weeks. Patient was suspected of having pneumonia. He was hospitalized for further management.  Reason for Visit: Community-acquired pneumonia and acute encephalopathy  Consultants: Gastroenterology  Procedures: None  Antibiotics: Ceftriaxone and azithromycin  Subjective/Interval History: Patient continues to feel better. Patient's son is at bedside. Admits to poor oral intake. Denies any nausea or vomiting or abdominal pain.  ROS: Denies any chest pain or shortness of breath  Objective:  Vital Signs  Vitals:   02/17/16 2200 02/18/16 0500 02/18/16 0533 02/18/16 1309  BP: 117/66  115/67 124/67  Pulse: 85  86 100  Resp: 18  18 20   Temp: 98.2 F (36.8 C)  97.5 F (36.4 C) 97.8 F (36.6 C)  TempSrc: Oral  Oral Oral  SpO2: 95%  96% 96%  Weight:  110.3 kg (243 lb 2.7 oz)      Intake/Output Summary (Last 24 hours) at 02/18/16 1311 Last data filed at 02/18/16 1140  Gross per 24 hour  Intake              900 ml  Output              500 ml  Net              400 ml   Filed Weights   02/16/16 0227 02/17/16 0624 02/18/16 0500  Weight: 113.2 kg (249 lb 9 oz) 110 kg (242 lb 8.1 oz) 110.3 kg (243 lb 2.7 oz)    General appearance: alert, appears stated age, and no distress. Resp: Diminished air entry at the bases. No crackles, wheezing or rhonchi. Cardio: regular rate and rhythm, S1, S2 normal, no murmur, click, rub or gallop GI: soft, non-tender; bowel sounds normal; no masses,  no organomegaly Neurologic: Awake, alert. No facial  asymmetry. Moving all his extremities.  Foley catheter with dark urine.  Lab Results:  Data Reviewed: I have personally reviewed following labs and imaging studies  CBC:  Recent Labs Lab 02/15/16 1744 02/15/16 2148 02/16/16 0446 02/17/16 0328 02/18/16 0403  WBC 5.5 6.6 7.2 9.9 12.7*  NEUTROABS 0.8*  --   --   --   --   HGB 9.9* 9.2* 8.8* 9.1* 8.3*  HCT 32.5* 29.9* 28.9* 30.3* 27.2*  MCV 91.5 91.4 90.3 91.0 88.9  PLT 41* 35* 38* 45* 55*    Basic Metabolic Panel:  Recent Labs Lab 02/15/16 1744 02/16/16 0446 02/17/16 0328 02/18/16 0403  NA 138 139 138 139  K 3.7 4.0 4.0 4.1  CL 107 108 105 106  CO2 22 21* 24 26  GLUCOSE 110* 125* 205* 162*  BUN 11 10 16  29*  CREATININE 1.03 1.13 1.32* 1.40*  CALCIUM 8.5* 7.9* 8.0* 8.2*    GFR: Estimated Creatinine Clearance: 44.2 mL/min (by C-G formula based on SCr of 1.4 mg/dL (H)).  Liver Function Tests:  Recent Labs Lab 02/15/16 1744  AST 35  ALT 25  ALKPHOS 44  BILITOT 0.7  PROT 8.9*  ALBUMIN 2.8*    Coagulation Profile:  Recent Labs Lab 02/15/16 1815 02/16/16 0446 02/17/16 0328 02/18/16 0403  INR 3.08 2.72 2.11 3.05  Radiology Studies: No results found.   Medications:  Scheduled: . aspirin EC  81 mg Oral Daily  . atorvastatin  20 mg Oral q1800  . azithromycin  500 mg Intravenous Q24H  . cefTRIAXone (ROCEPHIN)  IV  1 g Intravenous Q24H  . diltiazem  180 mg Oral Daily  . feeding supplement (ENSURE ENLIVE)  237 mL Oral BID BM  . gabapentin  100 mg Oral Daily  . latanoprost  1 drop Both Eyes QHS  . levETIRAcetam  500 mg Oral Q12H  . methylPREDNISolone (SOLU-MEDROL) injection  20 mg Intravenous Daily  . metoprolol tartrate  25 mg Oral Daily  . mirabegron ER  50 mg Oral Daily  . omega-3 acid ethyl esters  1 g Oral Daily  . vitamin C  1,000 mg Oral Daily   Continuous: . sodium chloride     [id number] **OR** acetaminophen, HYDROcodone-acetaminophen, ondansetron **OR** ondansetron  (ZOFRAN) IV  Assessment/Plan:  Principal Problem:   CAP (community acquired pneumonia) Active Problems:   CAD PCI to LAD/CFX in 1997. Cath '08- medical Rx   Myelodysplastic syndrome [facility])   Essential hypertension   Diastolic dysfunction: Grade 2   History of likely cardioembolic stroke ([id number]) with onset of new diagnosis A. fib; expressive aphasia   PAF (paroxysmal atrial fibrillation) (HCC)   Pancytopenia (HCC)   Pneumonia   Normocytic anemia    Community-acquired pneumonia Influenza PCR negative. Cultures are all negative. Change to oral antibiotics in the form of Vantin and azithromycin.  Acute encephalopathy  Most likely secondary to pneumonia. No evidence for focal neurological deficits. Seems to be back to baseline.  Acute renal failure  Creatinine continues to rise. Patient's urine was noted to be dark. He does not have good oral intake. We will administer some more IV fluids over the course of the next 24 hours. Repeat renal function panel tomorrow.  CAD status post stenting Denies any chest pain. Patient is on Coumadin at home.  Anemia with history of myelodysplastic syndrome  Noted to have worsening hemoglobin. Stool is positive for occult blood. No overt bleeding has been noted. Abdomen is stable for the most part. Discussed in detail with Dr. Benson Norway yesterday. No overt bleeding being noted in the form of hematochezia or melena. He does not fever, doing a colonoscopy at this time. He recommends reinitiating warfarin. If patient has overt GI bleeding, then may consider endoscopy. Patient should also follow-up with his hematologist (Dr. Rozetta Nunnery).    Myelodysplastic syndrome with anemia and thrombocytopenia See above. Patient also noted to have thrombocytopenia. Counts are stable. Continue to monitor closely.  History of seizures Continue Keppra.   Paroxysmal atrial fibrillation Chads2 vasc score more than 2. Patient is on Cardizem and metoprolol. Patient was also  on warfarin. Okay to resume per GI. However, INR is supratherapeutic today..  Microscopic Hematuria No growth on urine cultures. May need further workup as outpatient.  DVT Prophylaxis: INR is supratherapeutic.    Code Status: Full code  Family Communication: Discussed with the patient and his son Disposition Plan: PT and OT recommended SNF. Patient declines. Wants to go home. We will likely go home with home health and ready for discharge.     LOS: 3 days   [facility] Pager [phone] 02/18/2016, 1:11 PM  If 7PM-7AM, please contact night-coverage at [email or web address], password [facility]

## 2016-02-18 NOTE — Progress Notes (Signed)
Physical Therapy Treatment Patient Details Name: Terry Robertson. MRN: [id number] DOB: 1928-07-28 Today's Date: 02/18/2016    History of Present Illness Pt is an 82 y/o male who presents with AMS. PMH includes CVA, seizures, diabetes, CHF.    PT Comments    Pt with much improved mobility and cognition. Pt and family plan for pt to return home. Will need 24 hour assist at least initially.  Follow Up Recommendations  Home health PT;Supervision/Assistance - 24 hour     Equipment Recommendations  None recommended by PT    Recommendations for Other Services       Precautions / Restrictions Precautions Precautions: Fall Restrictions Weight Bearing Restrictions: No    Mobility  Bed Mobility Overal bed mobility: Needs Assistance Bed Mobility: Supine to Sit     Supine to sit: Mod assist     General bed mobility comments: Pt up in chair  Transfers Overall transfer level: Needs assistance Equipment used: Rolling walker (2 wheeled) Transfers: Sit to/from Stand Sit to Stand: Min assist Stand pivot transfers: +2 safety/equipment;Min assist       General transfer comment: Assist to bring hips up and for balance  Ambulation/Gait Ambulation/Gait assistance: Min assist Ambulation Distance (Feet): 140 Feet Assistive device: Rolling walker (2 wheeled) Gait Pattern/deviations: Step-through pattern;Decreased stride length;Trunk flexed Gait velocity: decr Gait velocity interpretation: <1.8 ft/sec, indicative of risk for recurrent falls General Gait Details: Assist for balance and support   Stairs            Wheelchair Mobility    Modified Rankin (Stroke Patients Only)       Balance Overall balance assessment: Needs assistance Sitting-balance support: No upper extremity supported;Feet supported Sitting balance-Leahy Scale: Fair     Standing balance support: Bilateral upper extremity supported Standing balance-Leahy Scale: Poor Standing balance comment: walker  and min guard for static standing                    Cognition Arousal/Alertness: Awake/alert Behavior During Therapy: Flat affect Overall Cognitive Status: No family/caregiver present to determine baseline cognitive functioning Area of Impairment: Following commands;Safety/judgement;Problem solving Orientation Level:  (ox3; time NT)     Following Commands: Follows multi-step commands with increased time Safety/Judgement: Decreased awareness of safety;Decreased awareness of deficits   Problem Solving: Slow processing      Exercises      General Comments        Pertinent Vitals/Pain Pain Assessment: No/denies pain Faces Pain Scale: Hurts a little bit    Home Living                      Prior Function            PT Goals (current goals can now be found in the care plan section) Acute Rehab PT Goals Patient Stated Goal: Pt did not state goals PT Goal Formulation: With patient Time For Goal Achievement: 02/25/16 Potential to Achieve Goals: Good Progress towards PT goals: Progressing toward goals;Goals met and updated - see care plan    Frequency    Min 3X/week      PT Plan Discharge plan needs to be updated    Co-evaluation             End of Session Equipment Utilized During Treatment: Gait belt Activity Tolerance: Patient tolerated treatment well Patient left: in chair;with call bell/phone within reach;with chair alarm set     Time: 4098-1191 PT Time Calculation (min) (ACUTE ONLY): 32 min  Charges:  $Gait Training: 23-37 mins                    G Codes:      Shary Decamp [place] 21-Feb-2016, 1:51 PM [employer] PT [phone]

## 2016-02-18 NOTE — Progress Notes (Signed)
Sent memo to pharmacy for missing vantin.

## 2016-02-18 NOTE — Progress Notes (Signed)
Occupational Therapy Treatment Patient Details Name: Terry Robertson. MRN: [id number] DOB: 1928-03-20 Today's Date: 02/18/2016    History of present illness Pt is an 82 y/o male who presents with AMS. PMH includes CVA, seizures, diabetes, CHF.   OT comments  Pt progressing well towards acute OT goals. Focus of session was bed mobility, simulated toilet transfer, and in-room functional mobility. Also discussed general UB strengthening exercises. D/c plan has been updated (see below).     Follow Up Recommendations  Home health OT;Supervision/Assistance - 24 hour;Other (comment) (per CSW note family declined [facility] rehab, want home)    Equipment Recommendations  Other (comment) (TBD)    Recommendations for Other Services      Precautions / Restrictions Precautions Precautions: Fall Restrictions Weight Bearing Restrictions: No       Mobility Bed Mobility Overal bed mobility: Needs Assistance Bed Mobility: Supine to Sit     Supine to sit: Mod assist     General bed mobility comments: HOB flat, rails and momentum utilized.  Transfers Overall transfer level: Needs assistance Equipment used: Rolling walker (2 wheeled) Transfers: Sit to/from [facility] Sit to Stand: Min assist Stand pivot transfers: +2 safety/equipment;Min assist       General transfer comment: from EOB and recliner    Balance Overall balance assessment: Needs assistance Sitting-balance support: Single extremity supported;Feet supported Sitting balance-Leahy Scale: Fair     Standing balance support: Bilateral upper extremity supported Standing balance-Leahy Scale: Poor Standing balance comment: rw and +1 min A                   ADL Overall ADL's : Needs assistance/impaired                         Toilet Transfer: Minimal assistance;BSC;+2 for safety/equipment;RW Toilet Transfer Details (indicate cue type and reason): simulated with in-room mobility and then  transfer to recliner. cues for technique with rw.          Functional mobility during ADLs: Min guard;+2 for safety/equipment;Rolling walker General ADL Comments: Pt completed pivotal steps from EOB to in front of recliner. Then completed functional mobility in-room about 6 feet forward and then backwards with chair follow provided but not utilized by pt.       Vision                     Perception     Praxis      Cognition   Behavior During Therapy: [facility] for tasks assessed/performed;Flat affect Overall Cognitive Status: Within Functional Limits for tasks assessed Area of Impairment: Safety/judgement;Problem solving        Following Commands: Follows multi-step commands with increased time Safety/Judgement: Decreased awareness of safety;Decreased awareness of deficits   Problem Solving: Slow processing      Extremity/Trunk Assessment               Exercises     Shoulder Instructions       General Comments      Pertinent Vitals/ Pain       Pain Assessment: Faces Faces Pain Scale: Hurts a little bit  Home Living                                          Prior Functioning/Environment  Frequency  Min 2X/week        Progress Toward Goals  OT Goals(current goals can now be found in the care plan section)  Progress towards OT goals: Progressing toward goals  Acute Rehab OT Goals Patient Stated Goal: Pt did not state goals OT Goal Formulation: With family Time For Goal Achievement: 03/01/16 Potential to Achieve Goals: Good ADL Goals Pt Will Perform Grooming: with supervision;sitting Pt Will Perform Upper Body Bathing: with supervision;sitting Pt Will Perform Lower Body Bathing: with mod assist;sit to/from stand Pt Will Perform Upper Body Dressing: with supervision;sitting Pt Will Perform Lower Body Dressing: with mod assist;sit to/from stand Pt Will Transfer to Toilet: with mod assist;bedside  commode;ambulating Pt Will Perform Toileting - Clothing Manipulation and hygiene: with mod assist;sit to/from stand Additional ADL Goal #1: Pt will transfer from supine to sit with mod assist in preparation for selfcare tasks.   Plan Discharge plan needs to be updated    Co-evaluation                 End of Session Equipment Utilized During Treatment: Gait belt;Rolling walker   Activity Tolerance Patient tolerated treatment well;Patient limited by fatigue   Patient Left with call bell/phone within reach;with nursing/sitter in room;in chair;with chair alarm set   Nurse Communication          Time: [id number] OT Time Calculation (min): 9 min  Charges: OT General Charges $OT Visit: 1 Procedure OT Treatments $Self Care/Home Management : 8-22 mins  Hortencia Pilar 02/18/2016, 12:47 PM

## 2016-02-19 LAB — CBC
HCT: 27.2 % — ABNORMAL LOW (ref 39.0–52.0)
Hemoglobin: 8.2 g/dL — ABNORMAL LOW (ref 13.0–17.0)
MCH: 27.6 pg (ref 26.0–34.0)
MCHC: 30.1 g/dL (ref 30.0–36.0)
MCV: 91.6 fL (ref 78.0–100.0)
PLATELETS: 67 10*3/uL — AB (ref 150–400)
RBC: 2.97 MIL/uL — ABNORMAL LOW (ref 4.22–5.81)
RDW: 19.6 % — AB (ref 11.5–15.5)
WBC: 14.7 10*3/uL — AB (ref 4.0–10.5)

## 2016-02-19 LAB — BASIC METABOLIC PANEL
ANION GAP: 8 (ref 5–15)
BUN: 23 mg/dL — ABNORMAL HIGH (ref 6–20)
CALCIUM: 8.2 mg/dL — AB (ref 8.9–10.3)
CO2: 26 mmol/L (ref 22–32)
CREATININE: 1.18 mg/dL (ref 0.61–1.24)
Chloride: 108 mmol/L (ref 101–111)
GFR, EST NON AFRICAN AMERICAN: 54 mL/min — AB (ref >60)
Glucose, Bld: 111 mg/dL — ABNORMAL HIGH (ref 65–99)
Potassium: 3.6 mmol/L (ref 3.5–5.1)
SODIUM: 142 mmol/L (ref 135–145)

## 2016-02-19 LAB — PROTIME-INR
INR: 2.7
PROTHROMBIN TIME: 29.2 s — AB (ref 11.4–15.2)

## 2016-02-19 MED ORDER — AZITHROMYCIN 500 MG PO TABS: 500.0000 mg | ORAL_TABLET | Freq: Every day | ORAL | 0 refills | Status: DC

## 2016-02-19 MED ORDER — WARFARIN SODIUM 5 MG PO TABS: 5.0000 mg | ORAL_TABLET | ORAL | Status: DC

## 2016-02-19 MED ORDER — FUROSEMIDE 20 MG PO TABS: 20.0000 mg | ORAL_TABLET | ORAL | Status: DC

## 2016-02-19 MED ORDER — METOPROLOL TARTRATE 25 MG PO TABS: 25.0000 mg | ORAL_TABLET | Freq: Every evening | ORAL | 0 refills | Status: AC

## 2016-02-19 MED ORDER — CEFDINIR 300 MG PO CAPS: 300.0000 mg | ORAL_CAPSULE | Freq: Two times a day (BID) | ORAL | 0 refills | Status: DC

## 2016-02-19 NOTE — Discharge Summary (Signed)
[facility]  Physician Discharge Summary   Patient ID: Terry Robertson. MRN: [id number] DOB/AGE: 1928/08/04 82 y.o.  Admit date: 02/15/2016 Discharge date: 02/19/2016  PCP: [name], MD  DISCHARGE DIAGNOSES:  Principal Problem:   CAP (community acquired pneumonia) Active Problems:   CAD PCI to LAD/CFX in 1997. Cath '08- medical Rx   Myelodysplastic syndrome [facility])   Essential hypertension   Diastolic dysfunction: Grade 2   History of likely cardioembolic stroke ([id number]) with onset of new diagnosis A. fib; expressive aphasia   PAF (paroxysmal atrial fibrillation) (HCC)   Pancytopenia (HCC)   Pneumonia   Normocytic anemia   RECOMMENDATIONS FOR OUTPATIENT FOLLOW UP: 1. Home health has been ordered. 2. Home health nurse to check CBC and PT INR in a few days 3. Please see below regarding conversation with GI regarding patient's anemia   DISCHARGE CONDITION: fair  Diet recommendation: As before  Filed Weights   02/17/16 0624 02/18/16 0500 02/19/16 0500  Weight: 110 kg (242 lb 8.1 oz) 110.3 kg (243 lb 2.7 oz) 110 kg (242 lb 8.1 oz)    INITIAL HISTORY: 82 year old male with a past medical history of coronary artery disease status post stenting, myelodysplastic syndrome, atrial fibrillation on anticoagulation, history of seizures, brought into the hospital due to worsening confusion. Patient was also noted to have a cough ongoing for the last 2 weeks. Patient was suspected of having pneumonia. He was hospitalized for further management.  Consultations:  Gastroenterology  Procedures:  None  HOSPITAL COURSE:   Community-acquired pneumonia Patient is admitted to the hospital. He was placed on IV antibiotics. Cultures were negative. Influenza PCR negative. Changed to oral antibiotics in the form of Vantin and azithromycin. To be continued for a few more days.  Acute encephalopathy Most likely secondary to pneumonia. No evidence for focal neurological  deficits. Seems to be back to baseline.  Acute renal failure  Patient's creatinine was noted to be elevated. He was given IV fluids. Rising creatinine was thought to be secondary to poor oral intake. This has improved over the last 24 hours.   CAD status post stenting Stable  Anemiawith history of myelodysplastic syndrome Noted to have worsening hemoglobin during this hospitalization, although hemoglobin has been stable. Stool is positive for occult blood. No overt bleeding has been noted. Patient has had colonoscopy previously with Dr. Collene Mares. Discussed in detail with Dr. Benson Norway who was covering for her. No overt bleeding being noted in the form of hematochezia or melena. He does not favor doing a colonoscopy at this time considering patient's other comorbidities and advanced age, without clear bleeding. He recommends reinitiating warfarin. If patient has overt GI bleeding, then may consider endoscopy. Patient should also follow-up with his hematologist (Dr. Rozetta Nunnery) as well.    Myelodysplastic syndrome with anemia and thrombocytopenia See above. Patient also noted to have thrombocytopenia. Counts are stable.   History of seizures Continue Keppra. Stable.  Paroxysmal atrial fibrillation Chads2 vasc score more than 2. Patient is on Cardizem and metoprolol. Patient was also on warfarin. Okay to resume per GI. INR has been supratherapeutic despite not being continued on warfarin. This is most likely due to interaction with antibiotics. He will be asked to resume warfarin after a few days.  Microscopic Hematuria No growth on urine cultures. May need further workup as outpatient.  Overall, stable. He is much better. Wishes to go home. Okay for discharge home today.   PERTINENT LABS:  The results of significant diagnostics from this hospitalization (including imaging,  microbiology, ancillary and laboratory) are listed below for reference.    Microbiology: Recent Results (from the past  240 hour(s))  Blood Culture (routine x 2)     Status: None (Preliminary result)   Collection Time: 02/15/16  6:10 PM  Result Value Ref Range Status   Specimen Description BLOOD LEFT ANTECUBITAL  Final   Special Requests BOTTLES DRAWN AEROBIC AND ANAEROBIC 5CC  Final   Culture NO GROWTH 3 DAYS  Final   Report Status PENDING  Incomplete  Blood Culture (routine x 2)     Status: None (Preliminary result)   Collection Time: 02/15/16  6:30 PM  Result Value Ref Range Status   Specimen Description BLOOD LEFT HAND  Final   Special Requests IN PEDIATRIC BOTTLE 4CC  Final   Culture NO GROWTH 3 DAYS  Final   Report Status PENDING  Incomplete  Urine culture     Status: None   Collection Time: 02/15/16  6:53 PM  Result Value Ref Range Status   Specimen Description URINE, CLEAN CATCH  Final   Special Requests NONE  Final   Culture NO GROWTH  Final   Report Status 02/17/2016 FINAL  Final     Labs: Basic Metabolic Panel:  Recent Labs Lab 02/15/16 1744 02/16/16 0446 02/17/16 0328 02/18/16 0403 02/19/16 0332  NA 138 139 138 139 142  K 3.7 4.0 4.0 4.1 3.6  CL 107 108 105 106 108  CO2 22 21* 24 26 26   GLUCOSE 110* 125* 205* 162* 111*  BUN 11 10 16  29* 23*  CREATININE 1.03 1.13 1.32* 1.40* 1.18  CALCIUM 8.5* 7.9* 8.0* 8.2* 8.2*   Liver Function Tests:  Recent Labs Lab 02/15/16 1744  AST 35  ALT 25  ALKPHOS 44  BILITOT 0.7  PROT 8.9*  ALBUMIN 2.8*   CBC:  Recent Labs Lab 02/15/16 1744 02/15/16 2148 02/16/16 0446 02/17/16 0328 02/18/16 0403 02/19/16 0332  WBC 5.5 6.6 7.2 9.9 12.7* 14.7*  NEUTROABS 0.8*  --   --   --   --   --   HGB 9.9* 9.2* 8.8* 9.1* 8.3* 8.2*  HCT 32.5* 29.9* 28.9* 30.3* 27.2* 27.2*  MCV 91.5 91.4 90.3 91.0 88.9 91.6  PLT 41* 35* 38* 45* 55* 67*     IMAGING STUDIES Dg Chest 2 View  Result Date: 02/15/2016 CLINICAL DATA:  Initial evaluation for acute fever. Evaluate for pneumonia. EXAM: CHEST  2 VIEW COMPARISON:  Prior radiograph from  01/16/2016. FINDINGS: Stable cardiomegaly.  Mediastinal silhouette within normal limits. Lungs mildly hypoinflated. A left-sided pleural effusion is similar to previous. Subtly increased left basilar opacity, which may reflect atelectasis or infiltrate. No other focal airspace disease. No pneumothorax. No acute osseous abnormality. IMPRESSION: Persistent left pleural effusion with slightly increased left basilar opacity, which may reflect atelectasis or infiltrate. Electronically Signed   By: Jeannine Boga M.D.   On: 02/15/2016 18:07    DISCHARGE EXAMINATION: Vitals:   02/18/16 1517 02/18/16 2202 02/19/16 0500 02/19/16 0528  BP: 124/72 124/64  121/65  Pulse: 91 78  90  Resp: 19 18  18   Temp: 97.7 F (36.5 C) 98 F (36.7 C)  98.5 F (36.9 C)  TempSrc: Oral Oral  Oral  SpO2: 96% 96%  96%  Weight:   110 kg (242 lb 8.1 oz)    General appearance: alert, cooperative, appears stated age and no distress Resp: clear to auscultation bilaterally Cardio: regular rate and rhythm, S1, S2 normal, no murmur, click, rub or gallop  GI: soft, non-tender; bowel sounds normal; no masses,  no organomegaly Extremities: extremities normal, atraumatic, no cyanosis or edema  DISPOSITION: Home with son  Discharge Instructions    AMB Referral to Post Management    Complete by:  As directed    Please assign to [facility]. Written consent signed. Referral received from inpatient RNCM. Please contact son, Rolland Bimler- son- for post discharge calls at [phone]. Multiple co-morbities. Marthenia Rolling, Cor[place], RN,BSN-THN [facility] [phone]   Reason for consult:  Please assign to [facility]   Expected date of contact:  1-3 days (reserved for hospital discharges)   Call MD for:  extreme fatigue    Complete by:  As directed    Call MD for:  persistant dizziness or light-headedness    Complete by:  As directed    Call MD for:  persistant nausea and vomiting     Complete by:  As directed    Call MD for:  severe uncontrolled pain    Complete by:  As directed    Call MD for:  temperature >100.4    Complete by:  As directed    Diet - low sodium heart healthy    Complete by:  As directed    Discharge instructions    Complete by:  As directed    Please resume Lasix and warfarin in a few days as instructed. Seek attention if you notice black stools or blood in the stools. Home health has been ordered. Blood work will be done by the home health nurse in a few days to check your hemoglobin. Your primary care physician may have to refer you to Dr. Collene Mares if anemia gets worse.   You were cared for by a hospitalist during your hospital stay. If you have any questions about your discharge medications or the care you received while you were in the hospital after you are discharged, you can call the unit and asked to speak with the hospitalist on call if the hospitalist that took care of you is not available. Once you are discharged, your primary care physician will handle any further medical issues. Please note that NO REFILLS for any discharge medications will be authorized once you are discharged, as it is imperative that you return to your primary care physician (or establish a relationship with a primary care physician if you do not have one) for your aftercare needs so that they can reassess your need for medications and monitor your lab values. If you do not have a primary care physician, you can call [phone] for a physician referral.   Increase activity slowly    Complete by:  As directed       ALLERGIES: No Known Allergies   Current Discharge Medication List    START taking these medications   Details  azithromycin (ZITHROMAX) 500 MG tablet Take 1 tablet (500 mg total) by mouth daily. For 2 more days Qty: 2 tablet, Refills: 0    cefdinir (OMNICEF) 300 MG capsule Take 1 capsule (300 mg total) by mouth 2 (two) times daily. For 3 more days Qty: 6  capsule, Refills: 0      CONTINUE these medications which have CHANGED   Details  furosemide (LASIX) 20 MG tablet Take 1 tablet (20 mg total) by mouth every morning. RESUME ON 02/22/16 Qty: 30 tablet    metoprolol tartrate (LOPRESSOR) 25 MG tablet Take 1 tablet (25 mg total) by mouth every evening. Qty: 60 tablet, Refills: 0  warfarin (COUMADIN) 5 MG tablet Take 1-1.5 tablets (5-7.5 mg total) by mouth See admin instructions. Takes 5mg  on sun only, takes 7.5mg  all other days.  PLEASE RESUME ON 02/22/16.      CONTINUE these medications which have NOT CHANGED   Details  Ascorbic Acid (VITAMIN C) 1000 MG tablet Take 1,000 mg by mouth daily.    aspirin EC 81 MG tablet Take 81 mg by mouth daily.    atorvastatin (LIPITOR) 20 MG tablet Take 20 mg by mouth daily.    CARTIA XT 180 MG 24 hr capsule Take 180 mg by mouth daily.     Cholecalciferol (VITAMIN D) 2000 UNITS CAPS Take 2,000 Units by mouth every morning.     ferrous sulfate 325 (65 FE) MG tablet Take 325 mg by mouth daily with breakfast.    gabapentin (NEURONTIN) 100 MG capsule Take 100 mg by mouth at bedtime.     HYDROcodone-acetaminophen (NORCO/VICODIN) 5-325 MG tablet Take 1 tablet by mouth 2 (two) times daily as needed. Take 1 tab bid or tid prn Qty: 60 tablet, Refills: 0    latanoprost (XALATAN) 0.005 % ophthalmic solution Place 1 drop into both eyes at bedtime.  Refills: 4   Associated Diagnoses: Myelodysplastic syndrome (HCC)    levETIRAcetam (KEPPRA) 500 MG tablet Take 1 tablet (500 mg total) by mouth every 12 (twelve) hours. Qty: 180 tablet, Refills: 3    polyethylene glycol (MIRALAX / GLYCOLAX) packet Take 17 g by mouth daily.    predniSONE (DELTASONE) 10 MG tablet Take 5 mg by mouth daily with breakfast.     VASCEPA 1 G CAPS Take 2 g by mouth 2 (two) times daily.  Refills: 2    feeding supplement, ENSURE ENLIVE, (ENSURE ENLIVE) LIQD Take 237 mLs by mouth 2 (two) times daily between meals. Qty: 237 mL,  Refills: 12      STOP taking these medications     MYRBETRIQ 50 MG TB24 tablet          Follow-up Information    SANDERS,ROBYN N, MD. Schedule an appointment as soon as possible for a visit in 1 week(s).   Specialty:  Internal Medicine Why:  Needs close watch on hemoglobin. Consider referral to Dr. Collene Mares with GI if anemia worsens. Contact information: [address] [place] [address] [place] Follow up.   Why:  home health physical therapy, RN, and occupational therapy  Contact information: [address] [place] [address] [phone]           TOTAL DISCHARGE TIME: 41 minutes  [facility] Pager [phone]  02/19/2016, 12:52 PM

## 2016-02-19 NOTE — Discharge Instructions (Signed)
Community-Acquired Pneumonia, Adult °Introduction °Pneumonia is an infection of the lungs. One type of pneumonia can happen while a person is in a hospital. A different type can happen when a person is not in a hospital (community-acquired pneumonia). It is easy for this kind to spread from person to person. It can spread to you if you breathe near an infected person who coughs or sneezes. Some symptoms include: °· A dry cough. °· A wet (productive) cough. °· Fever. °· Sweating. °· Chest pain. °Follow these instructions at home: °· Take over-the-counter and prescription medicines only as told by your doctor. °¨ Only take cough medicine if you are losing sleep. °¨ If you were prescribed an antibiotic medicine, take it as told by your doctor. Do not stop taking the antibiotic even if you start to feel better. °· Sleep with your head and neck raised (elevated). You can do this by putting a few pillows under your head, or you can sleep in a recliner. °· Do not use tobacco products. These include cigarettes, chewing tobacco, and e-cigarettes. If you need help quitting, ask your doctor. °· Drink enough water to keep your pee (urine) clear or pale yellow. °A shot (vaccine) can help prevent pneumonia. Shots are often suggested for: °· People older than 80 years of age. °· People older than 80 years of age: °¨ Who are having cancer treatment. °¨ Who have long-term (chronic) lung disease. °¨ Who have problems with their body's defense system (immune system). °You may also prevent pneumonia if you take these actions: °· Get the flu (influenza) shot every year. °· Go to the dentist as often as told. °· Wash your hands often. If soap and water are not available, use hand sanitizer. °Contact a doctor if: °· You have a fever. °· You lose sleep because your cough medicine does not help. °Get help right away if: °· You are short of breath and it gets worse. °· You have more chest pain. °· Your sickness gets worse. This is very  serious if: °¨ You are an older adult. °¨ Your body's defense system is weak. °· You cough up blood. °This information is not intended to replace advice given to you by your health care provider. Make sure you discuss any questions you have with your health care provider. °Document Released: 08/09/2007 Document Revised: 07/29/2015 Document Reviewed: 06/17/2014 °© 2017 [employer]vier ° °

## 2016-02-19 NOTE — Care Management Note (Signed)
Case Management Note  Patient Details  Name: Terry Robertson. MRN: [id number] Date of Birth: August 16, 1928  Subjective/Objective:  CAP, acute enscephalopathy                  Action/Plan: Discharge Planning: AVS reviewed: chart reviewed, please see previous NCM notes. [facility] [place] arranged with [facility]. Contacted [facility] to make aware of dc home today with HH.   PCP [name] MD   Expected Discharge Date:  02/19/2016               Expected Discharge Plan:  [place]  In-House Referral:  Clinical Social Work  Discharge planning Services  CM Consult  Post Acute Care Choice:  Resumption of Svcs/PTA Provider Choice offered to:  Adult Children  DME Arranged:  N/A DME Agency:  NA  HH Arranged:  RN, PT, OT HH Agency:  [place]  Status of Service:  Completed, signed off  If discussed at [place] of Stay Meetings, dates discussed:    Additional Comments:  Erenest Rasher, RN 02/19/2016, 10:48 AM

## 2016-02-20 LAB — CULTURE, BLOOD (ROUTINE X 2)
CULTURE: NO GROWTH
Culture: NO GROWTH

## 2016-02-21 ENCOUNTER — Inpatient Hospital Stay (HOSPITAL_COMMUNITY)
Admission: EM | Admit: 2016-02-21 | Discharge: 2016-02-25 | DRG: 193 | Disposition: A | Discharge status: Hospice | Payer: Commercial Managed Care - HMO | Attending: Family Medicine | Admitting: Family Medicine

## 2016-02-21 ENCOUNTER — Emergency Department (HOSPITAL_COMMUNITY): Payer: Commercial Managed Care - HMO

## 2016-02-21 ENCOUNTER — Encounter (HOSPITAL_COMMUNITY): Payer: Self-pay | Admitting: Family Medicine

## 2016-02-21 DIAGNOSIS — Z7982 Long term (current) use of aspirin: Secondary | ICD-10-CM

## 2016-02-21 DIAGNOSIS — J189 Pneumonia, unspecified organism: Principal | ICD-10-CM

## 2016-02-21 DIAGNOSIS — I6340 Cerebral infarction due to embolism of unspecified cerebral artery: Secondary | ICD-10-CM | POA: Diagnosis not present

## 2016-02-21 DIAGNOSIS — M6282 Rhabdomyolysis: Secondary | ICD-10-CM | POA: Diagnosis present

## 2016-02-21 DIAGNOSIS — G8929 Other chronic pain: Secondary | ICD-10-CM | POA: Diagnosis present

## 2016-02-21 DIAGNOSIS — Z7952 Long term (current) use of systemic steroids: Secondary | ICD-10-CM

## 2016-02-21 DIAGNOSIS — I252 Old myocardial infarction: Secondary | ICD-10-CM | POA: Diagnosis not present

## 2016-02-21 DIAGNOSIS — C9202 Acute myeloblastic leukemia, in relapse: Secondary | ICD-10-CM | POA: Diagnosis present

## 2016-02-21 DIAGNOSIS — Z87891 Personal history of nicotine dependence: Secondary | ICD-10-CM

## 2016-02-21 DIAGNOSIS — D472 Monoclonal gammopathy: Secondary | ICD-10-CM | POA: Diagnosis present

## 2016-02-21 DIAGNOSIS — Z7189 Other specified counseling: Secondary | ICD-10-CM | POA: Diagnosis not present

## 2016-02-21 DIAGNOSIS — D469 Myelodysplastic syndrome, unspecified: Secondary | ICD-10-CM | POA: Diagnosis not present

## 2016-02-21 DIAGNOSIS — I350 Nonrheumatic aortic (valve) stenosis: Secondary | ICD-10-CM | POA: Diagnosis present

## 2016-02-21 DIAGNOSIS — C9001 Multiple myeloma in remission: Secondary | ICD-10-CM | POA: Diagnosis present

## 2016-02-21 DIAGNOSIS — D72829 Elevated white blood cell count, unspecified: Secondary | ICD-10-CM

## 2016-02-21 DIAGNOSIS — W19XXXA Unspecified fall, initial encounter: Secondary | ICD-10-CM | POA: Diagnosis present

## 2016-02-21 DIAGNOSIS — I2510 Atherosclerotic heart disease of native coronary artery without angina pectoris: Secondary | ICD-10-CM | POA: Diagnosis present

## 2016-02-21 DIAGNOSIS — Z66 Do not resuscitate: Secondary | ICD-10-CM | POA: Diagnosis not present

## 2016-02-21 DIAGNOSIS — G9340 Encephalopathy, unspecified: Secondary | ICD-10-CM

## 2016-02-21 DIAGNOSIS — E860 Dehydration: Secondary | ICD-10-CM | POA: Diagnosis present

## 2016-02-21 DIAGNOSIS — Y95 Nosocomial condition: Secondary | ICD-10-CM | POA: Diagnosis present

## 2016-02-21 DIAGNOSIS — D696 Thrombocytopenia, unspecified: Secondary | ICD-10-CM | POA: Diagnosis present

## 2016-02-21 DIAGNOSIS — Z9861 Coronary angioplasty status: Secondary | ICD-10-CM

## 2016-02-21 DIAGNOSIS — Y92009 Unspecified place in unspecified non-institutional (private) residence as the place of occurrence of the external cause: Secondary | ICD-10-CM | POA: Diagnosis not present

## 2016-02-21 DIAGNOSIS — G4733 Obstructive sleep apnea (adult) (pediatric): Secondary | ICD-10-CM | POA: Diagnosis present

## 2016-02-21 DIAGNOSIS — I519 Heart disease, unspecified: Secondary | ICD-10-CM | POA: Diagnosis not present

## 2016-02-21 DIAGNOSIS — I480 Paroxysmal atrial fibrillation: Secondary | ICD-10-CM | POA: Diagnosis present

## 2016-02-21 DIAGNOSIS — E119 Type 2 diabetes mellitus without complications: Secondary | ICD-10-CM

## 2016-02-21 DIAGNOSIS — M4800 Spinal stenosis, site unspecified: Secondary | ICD-10-CM | POA: Diagnosis present

## 2016-02-21 DIAGNOSIS — I5032 Chronic diastolic (congestive) heart failure: Secondary | ICD-10-CM | POA: Diagnosis present

## 2016-02-21 DIAGNOSIS — Z515 Encounter for palliative care: Secondary | ICD-10-CM | POA: Diagnosis not present

## 2016-02-21 DIAGNOSIS — C9000 Multiple myeloma not having achieved remission: Secondary | ICD-10-CM | POA: Diagnosis not present

## 2016-02-21 DIAGNOSIS — I110 Hypertensive heart disease with heart failure: Secondary | ICD-10-CM | POA: Diagnosis present

## 2016-02-21 DIAGNOSIS — Z6841 Body Mass Index (BMI) 40.0 and over, adult: Secondary | ICD-10-CM

## 2016-02-21 DIAGNOSIS — I639 Cerebral infarction, unspecified: Secondary | ICD-10-CM | POA: Diagnosis present

## 2016-02-21 DIAGNOSIS — Z79899 Other long term (current) drug therapy: Secondary | ICD-10-CM

## 2016-02-21 DIAGNOSIS — E1159 Type 2 diabetes mellitus with other circulatory complications: Secondary | ICD-10-CM

## 2016-02-21 DIAGNOSIS — C9200 Acute myeloblastic leukemia, not having achieved remission: Secondary | ICD-10-CM | POA: Diagnosis not present

## 2016-02-21 DIAGNOSIS — I10 Essential (primary) hypertension: Secondary | ICD-10-CM | POA: Diagnosis present

## 2016-02-21 DIAGNOSIS — Z8673 Personal history of transient ischemic attack (TIA), and cerebral infarction without residual deficits: Secondary | ICD-10-CM

## 2016-02-21 DIAGNOSIS — Z7901 Long term (current) use of anticoagulants: Secondary | ICD-10-CM

## 2016-02-21 DIAGNOSIS — I5189 Other ill-defined heart diseases: Secondary | ICD-10-CM | POA: Diagnosis present

## 2016-02-21 DIAGNOSIS — Y92099 Unspecified place in other non-institutional residence as the place of occurrence of the external cause: Secondary | ICD-10-CM

## 2016-02-21 DIAGNOSIS — W19XXXD Unspecified fall, subsequent encounter: Secondary | ICD-10-CM

## 2016-02-21 DIAGNOSIS — R748 Abnormal levels of other serum enzymes: Secondary | ICD-10-CM

## 2016-02-21 DIAGNOSIS — Z8249 Family history of ischemic heart disease and other diseases of the circulatory system: Secondary | ICD-10-CM

## 2016-02-21 HISTORY — DX: Adverse effect of unspecified anesthetic, initial encounter: T41.45XA

## 2016-02-21 HISTORY — DX: Other complications of anesthesia, initial encounter: T88.59XA

## 2016-02-21 HISTORY — DX: Pneumonia, unspecified organism: J18.9

## 2016-02-21 LAB — URINALYSIS, ROUTINE W REFLEX MICROSCOPIC
BACTERIA UA: NONE SEEN
Bacteria, UA: NONE SEEN
Bilirubin Urine: NEGATIVE
Bilirubin Urine: NEGATIVE
Glucose, UA: NEGATIVE mg/dL
Glucose, UA: NEGATIVE mg/dL
Hgb urine dipstick: NEGATIVE
Ketones, ur: NEGATIVE mg/dL
Ketones, ur: NEGATIVE mg/dL
Leukocytes, UA: NEGATIVE
Leukocytes, UA: NEGATIVE
Nitrite: NEGATIVE
Nitrite: NEGATIVE
PROTEIN: 100 mg/dL — AB
PROTEIN: 100 mg/dL — AB
SPECIFIC GRAVITY, URINE: 1.018 (ref 1.005–1.030)
Specific Gravity, Urine: 1.018 (ref 1.005–1.030)
Squamous Epithelial / LPF: NONE SEEN
pH: 6 (ref 5.0–8.0)
pH: 6 (ref 5.0–8.0)

## 2016-02-21 LAB — CBC
HEMATOCRIT: 27 % — AB (ref 39.0–52.0)
HEMOGLOBIN: 8.4 g/dL — AB (ref 13.0–17.0)
MCH: 27.5 pg (ref 26.0–34.0)
MCHC: 31.1 g/dL (ref 30.0–36.0)
MCV: 88.2 fL (ref 78.0–100.0)
Platelets: 93 10*3/uL — ABNORMAL LOW (ref 150–400)
RBC: 3.06 MIL/uL — ABNORMAL LOW (ref 4.22–5.81)
RDW: 19.1 % — AB (ref 11.5–15.5)
WBC: 23.2 10*3/uL — ABNORMAL HIGH (ref 4.0–10.5)

## 2016-02-21 LAB — CBC WITH DIFFERENTIAL/PLATELET
BAND NEUTROPHILS: 0 %
BASOS ABS: 0 10*3/uL (ref 0.0–0.1)
BASOS PCT: 0 %
Blasts: 0 %
EOS ABS: 0 10*3/uL (ref 0.0–0.7)
EOS PCT: 0 %
HCT: 28.3 % — ABNORMAL LOW (ref 39.0–52.0)
Hemoglobin: 8.6 g/dL — ABNORMAL LOW (ref 13.0–17.0)
LYMPHS ABS: 3.9 10*3/uL (ref 0.7–4.0)
Lymphocytes Relative: 18 %
MCH: 27 pg (ref 26.0–34.0)
MCHC: 30.4 g/dL (ref 30.0–36.0)
MCV: 89 fL (ref 78.0–100.0)
METAMYELOCYTES PCT: 0 %
MONO ABS: 2.2 10*3/uL — AB (ref 0.1–1.0)
MYELOCYTES: 0 %
Monocytes Relative: 10 %
NRBC: 0 /100{WBCs}
Neutro Abs: 4.8 10*3/uL (ref 1.7–7.7)
Neutrophils Relative %: 22 %
Other: 50 %
PLATELETS: 83 10*3/uL — AB (ref 150–400)
Promyelocytes Absolute: 0 %
RBC: 3.18 MIL/uL — ABNORMAL LOW (ref 4.22–5.81)
RDW: 19.3 % — AB (ref 11.5–15.5)
WBC: 21.6 10*3/uL — ABNORMAL HIGH (ref 4.0–10.5)

## 2016-02-21 LAB — BASIC METABOLIC PANEL
Anion gap: 10 (ref 5–15)
BUN: 10 mg/dL (ref 6–20)
CHLORIDE: 106 mmol/L (ref 101–111)
CO2: 22 mmol/L (ref 22–32)
Calcium: 8.1 mg/dL — ABNORMAL LOW (ref 8.9–10.3)
Creatinine, Ser: 1.16 mg/dL (ref 0.61–1.24)
GFR calc Af Amer: >60 mL/min (ref >60)
GFR calc non Af Amer: 56 mL/min — ABNORMAL LOW (ref >60)
GLUCOSE: 104 mg/dL — AB (ref 65–99)
POTASSIUM: 3.5 mmol/L (ref 3.5–5.1)
Sodium: 138 mmol/L (ref 135–145)

## 2016-02-21 LAB — HEPATIC FUNCTION PANEL
ALT: 110 U/L — AB (ref 17–63)
AST: 94 U/L — AB (ref 15–41)
Albumin: 2.4 g/dL — ABNORMAL LOW (ref 3.5–5.0)
Alkaline Phosphatase: 46 U/L (ref 38–126)
BILIRUBIN DIRECT: 0.1 mg/dL (ref 0.1–0.5)
BILIRUBIN INDIRECT: 0.7 mg/dL (ref 0.3–0.9)
TOTAL PROTEIN: 8.2 g/dL — AB (ref 6.5–8.1)
Total Bilirubin: 0.8 mg/dL (ref 0.3–1.2)

## 2016-02-21 LAB — STREP PNEUMONIAE URINARY ANTIGEN: STREP PNEUMO URINARY ANTIGEN: NEGATIVE

## 2016-02-21 LAB — CBG MONITORING, ED: GLUCOSE-CAPILLARY: 91 mg/dL (ref 65–99)

## 2016-02-21 LAB — PROTIME-INR
INR: 1.93
PROTHROMBIN TIME: 22.4 s — AB (ref 11.4–15.2)

## 2016-02-21 LAB — GLUCOSE, CAPILLARY: GLUCOSE-CAPILLARY: 90 mg/dL (ref 65–99)

## 2016-02-21 LAB — I-STAT CG4 LACTIC ACID, ED: Lactic Acid, Venous: 1.02 mmol/L (ref 0.5–1.9)

## 2016-02-21 LAB — I-STAT TROPONIN, ED: Troponin i, poc: 0.04 ng/mL (ref 0.00–0.08)

## 2016-02-21 LAB — CK: CK TOTAL: 772 U/L — AB (ref 49–397)

## 2016-02-21 LAB — AMMONIA: AMMONIA: 19 umol/L (ref 9–35)

## 2016-02-21 MED ORDER — INSULIN ASPART 100 UNIT/ML ~~LOC~~ SOLN
0.0000 [IU] | Freq: Three times a day (TID) | SUBCUTANEOUS | Status: DC
  Administered 2016-02-22: 2 [IU] via SUBCUTANEOUS
  Administered 2016-02-23: 5 [IU] via SUBCUTANEOUS
  Administered 2016-02-23 (×2): 3 [IU] via SUBCUTANEOUS
  Administered 2016-02-24 (×2): 2 [IU] via SUBCUTANEOUS

## 2016-02-21 MED ORDER — METHYLPREDNISOLONE SODIUM SUCC 40 MG IJ SOLR
40.0000 mg | INTRAMUSCULAR | Status: DC
  Administered 2016-02-21 – 2016-02-22 (×2): 40 mg via INTRAVENOUS
  Filled 2016-02-21 (×2): qty 1

## 2016-02-21 MED ORDER — ATORVASTATIN CALCIUM 20 MG PO TABS
20.0000 mg | ORAL_TABLET | Freq: Every day | ORAL | Status: DC
  Administered 2016-02-22 – 2016-02-23 (×2): 20 mg via ORAL
  Filled 2016-02-21 (×3): qty 1

## 2016-02-21 MED ORDER — SODIUM CHLORIDE 0.9 % IV SOLN: 250.0000 mL | INTRAVENOUS | Status: DC | PRN

## 2016-02-21 MED ORDER — POLYETHYLENE GLYCOL 3350 17 G PO PACK
17.0000 g | PACK | Freq: Every day | ORAL | Status: DC
  Administered 2016-02-22 – 2016-02-25 (×5): 17 g via ORAL
  Filled 2016-02-21 (×5): qty 1

## 2016-02-21 MED ORDER — ASPIRIN EC 81 MG PO TBEC
81.0000 mg | DELAYED_RELEASE_TABLET | Freq: Every day | ORAL | Status: DC
  Administered 2016-02-21 – 2016-02-24 (×4): 81 mg via ORAL
  Filled 2016-02-21 (×4): qty 1

## 2016-02-21 MED ORDER — DEXTROSE 5 % IV SOLN
1.0000 g | Freq: Three times a day (TID) | INTRAVENOUS | Status: DC
  Administered 2016-02-22 (×2): 1 g via INTRAVENOUS
  Filled 2016-02-21 (×3): qty 1

## 2016-02-21 MED ORDER — ICOSAPENT ETHYL 1 G PO CAPS: 2.0000 g | ORAL_CAPSULE | Freq: Two times a day (BID) | ORAL | Status: DC

## 2016-02-21 MED ORDER — OMEGA-3-ACID ETHYL ESTERS 1 G PO CAPS
2.0000 g | ORAL_CAPSULE | Freq: Two times a day (BID) | ORAL | Status: DC
  Administered 2016-02-21 – 2016-02-24 (×7): 2 g via ORAL
  Filled 2016-02-21 (×8): qty 2

## 2016-02-21 MED ORDER — FERROUS SULFATE 325 (65 FE) MG PO TABS
325.0000 mg | ORAL_TABLET | Freq: Every day | ORAL | Status: DC
  Administered 2016-02-22 – 2016-02-24 (×3): 325 mg via ORAL
  Filled 2016-02-21 (×3): qty 1

## 2016-02-21 MED ORDER — HYDROMORPHONE HCL 2 MG/ML IJ SOLN
0.5000 mg | INTRAMUSCULAR | Status: DC | PRN
  Administered 2016-02-22: 0.5 mg via INTRAVENOUS
  Filled 2016-02-21 (×2): qty 1

## 2016-02-21 MED ORDER — VITAMIN D 1000 UNITS PO TABS
2000.0000 [IU] | ORAL_TABLET | ORAL | Status: DC
  Administered 2016-02-22 – 2016-02-24 (×3): 2000 [IU] via ORAL
  Filled 2016-02-21 (×3): qty 2

## 2016-02-21 MED ORDER — LATANOPROST 0.005 % OP SOLN
1.0000 [drp] | Freq: Every day | OPHTHALMIC | Status: DC
  Administered 2016-02-21 – 2016-02-24 (×4): 1 [drp] via OPHTHALMIC
  Filled 2016-02-21: qty 2.5

## 2016-02-21 MED ORDER — FUROSEMIDE 20 MG PO TABS
20.0000 mg | ORAL_TABLET | ORAL | Status: DC
  Administered 2016-02-22 – 2016-02-25 (×4): 20 mg via ORAL
  Filled 2016-02-21 (×4): qty 1

## 2016-02-21 MED ORDER — HYDROMORPHONE HCL 2 MG/ML IJ SOLN: 0.5000 mg | Freq: Once | INTRAMUSCULAR | Status: DC

## 2016-02-21 MED ORDER — VANCOMYCIN HCL 10 G IV SOLR
1250.0000 mg | INTRAVENOUS | Status: DC
  Administered 2016-02-22: 1250 mg via INTRAVENOUS
  Filled 2016-02-21 (×2): qty 1250

## 2016-02-21 MED ORDER — WARFARIN SODIUM 5 MG PO TABS
10.0000 mg | ORAL_TABLET | Freq: Once | ORAL | Status: AC
  Administered 2016-02-21: 10 mg via ORAL
  Filled 2016-02-21: qty 2

## 2016-02-21 MED ORDER — LEVETIRACETAM 500 MG PO TABS
500.0000 mg | ORAL_TABLET | Freq: Two times a day (BID) | ORAL | Status: DC
  Administered 2016-02-21 – 2016-02-25 (×8): 500 mg via ORAL
  Filled 2016-02-21 (×8): qty 1

## 2016-02-21 MED ORDER — VITAMIN C 500 MG PO TABS
1000.0000 mg | ORAL_TABLET | Freq: Every day | ORAL | Status: DC
  Administered 2016-02-21 – 2016-02-24 (×4): 1000 mg via ORAL
  Filled 2016-02-21 (×4): qty 2

## 2016-02-21 MED ORDER — VANCOMYCIN HCL 10 G IV SOLR
1500.0000 mg | Freq: Once | INTRAVENOUS | Status: AC
  Administered 2016-02-21: 1500 mg via INTRAVENOUS
  Filled 2016-02-21: qty 1500

## 2016-02-21 MED ORDER — SODIUM CHLORIDE 0.9% FLUSH: 3.0000 mL | INTRAVENOUS | Status: DC | PRN

## 2016-02-21 MED ORDER — WARFARIN - PHARMACIST DOSING INPATIENT: Freq: Every day | Status: DC

## 2016-02-21 MED ORDER — ENSURE ENLIVE PO LIQD
237.0000 mL | Freq: Two times a day (BID) | ORAL | Status: DC
  Administered 2016-02-22 – 2016-02-25 (×5): 237 mL via ORAL

## 2016-02-21 MED ORDER — SODIUM CHLORIDE 0.9% FLUSH
3.0000 mL | Freq: Two times a day (BID) | INTRAVENOUS | Status: DC
  Administered 2016-02-23 – 2016-02-24 (×3): 3 mL via INTRAVENOUS

## 2016-02-21 MED ORDER — SODIUM CHLORIDE 0.9 % IV SOLN
1000.0000 mL | Freq: Once | INTRAVENOUS | Status: AC
  Administered 2016-02-21: 1000 mL via INTRAVENOUS

## 2016-02-21 MED ORDER — SODIUM CHLORIDE 0.9 % IV SOLN
INTRAVENOUS | Status: AC
  Administered 2016-02-21: 1000 mL via INTRAVENOUS
  Administered 2016-02-22: 14:00:00 via INTRAVENOUS

## 2016-02-21 MED ORDER — DEXTROSE 5 % IV SOLN
1.0000 g | Freq: Once | INTRAVENOUS | Status: AC
  Administered 2016-02-21: 1 g via INTRAVENOUS
  Filled 2016-02-21: qty 1

## 2016-02-21 MED ORDER — METOPROLOL TARTRATE 25 MG PO TABS
25.0000 mg | ORAL_TABLET | Freq: Every evening | ORAL | Status: DC
  Administered 2016-02-21 – 2016-02-24 (×4): 25 mg via ORAL
  Filled 2016-02-21 (×4): qty 1

## 2016-02-21 MED ORDER — GABAPENTIN 100 MG PO CAPS
100.0000 mg | ORAL_CAPSULE | Freq: Every day | ORAL | Status: DC
  Administered 2016-02-21 – 2016-02-24 (×4): 100 mg via ORAL
  Filled 2016-02-21 (×4): qty 1

## 2016-02-21 MED ORDER — DILTIAZEM HCL ER COATED BEADS 180 MG PO CP24
180.0000 mg | ORAL_CAPSULE | Freq: Every day | ORAL | Status: DC
  Administered 2016-02-21 – 2016-02-25 (×5): 180 mg via ORAL
  Filled 2016-02-21 (×5): qty 1

## 2016-02-21 NOTE — ED Provider Notes (Signed)
[facility] DEPT Provider Note   CSN: [id number] Arrival date & time: 02/21/16  1032     History   Chief Complaint Chief Complaint  Patient presents with  . Back Pain  . Weakness    HPI Terry Ore. is a 82 y.o. male with a past medical history of myelodysplastic syndrome, spinal stenosis with neurogenic claudication, history of CAD, seizures, recently admitted to the hospital for community-acquired pneumonia and discharged 2 days ago. The patient was noted to have worsening anemia and a heme positive stool. During his admission . No colonoscopy done during the visit after consultation with GI. The patient is attended by his family. His family gives the history because the patient is confused. There is a level V caveat due to encephalopathy. According to the families. They found him on the floor of his house today. He is complaining of severe pack pain and inability to walk. The patient is unsure if it's because his legs are too weak or his back hurts too much. As above. He has a history of chronic stenosis with neurogenic claudication. His family states that he had an injury to the back in the 1970s and has had chronic progressively worsening back pain since that time. They state that he has always been able to walk, however, until this morning. Last night they were taking care of the patient and he was unable to get out of his chair because of back pain. When he was found on the floor today. He was down for an unknown amount of time. He has not had any repeat fevers or worsening shortness of breath. No apparent neurologic abnormalities.  HPI  Past Medical History:  Diagnosis Date  . Aortic stenosis, mild 08/10/2014   : EF 60-65%, mod Conc LVH, cannot assess Diastolic Fxn.  Mild AS. Mild MVP without MR., Mod LA/RA dilation.  . Asbestosis [facility])    family unclear where he was evaluated in the past  . BPH (benign prostatic hyperplasia)    TURP in 12/2010  . CAD S/P percutaneous  coronary angioplasty 1997   status post remote PCI to the LAD and circumflex in 1997, heart cath in 2008 showed widely patent stents in the circumflex and LAD.  RCA had a mid lesion and then was totally occluded distally.  . CHF (congestive heart failure) ([place])   . Diabetes mellitus   . Essential hypertension   . MGUS (monoclonal gammopathy of unknown significance) 09/21/2014  . Multiple myeloma ([place]) 09/29/2014  . Myelodysplastic syndrome (HCC)    w/mild anemia an neutropenia  . Obesity, morbid ([place])    BMI  . OSA on CPAP   . Paroxysmal atrial fibrillation (HCC)    on coumadin; Diagnosed at the time of his 1st CVA.  CHA2DS2VASC = 6. On  Warfarin  . Seizures ([place])    Post CVA; on Keppra; stable  . ST-segment elevation myocardial infarction (STEMI) of inferior wall ([place]) 1997   inferior MI with left circumflex stent  . Stroke ([place]) 08/2011, 03/2012   L MCA (Afib RVR while admitted for hypotension) - expressive aphasia (almost totally recovered)    Patient Active Problem List   Diagnosis Date Noted  . CAP (community acquired pneumonia) 02/15/2016  . Pneumonia 02/15/2016  . Normocytic anemia 02/15/2016  . Thrombocytopenia ([place]) 11/04/2015  . Poor dentition 07/23/2015  . Multiple myeloma in remission ([place]) 07/22/2015  . Pancytopenia ([place]) 12/29/2014  . Multiple myeloma ([place]) 09/29/2014  . MGUS (monoclonal gammopathy of unknown significance) 09/21/2014  .  Left ventricular diastolic dysfunction, NYHA class 2 (grade 2 dysfunction with elevated filling pressures) 10/01/2013  . Hemothorax, left 06/25/2013  . Severe obesity (BMI >= 40) ([place]) 03/09/2013  . PAF (paroxysmal atrial fibrillation) ([place]) 03/09/2013  . Long term (current) use of anticoagulants 05/21/2012  . CVA (cerebral infarction) 03/28/2012  . History of likely cardioembolic stroke (10/06/5051) with onset of new diagnosis A. fib; expressive aphasia 08/09/2011  . Diastolic dysfunction: Grade 2 08/07/2011  . Pancytopenia, acquired  ([place]) 08/07/2011  . CAD PCI to LAD/CFX in 1997. Cath '08- medical Rx 08/05/2011  . Myelodysplastic syndrome ([place]) 08/05/2011  . Dyslipidemia, goal LDL below 70 08/05/2011  . Essential hypertension 08/05/2011  . S/P TURP Oct 2012 08/05/2011  . DM type 2 (diabetes mellitus, type 2) ([place]) 08/05/2011    Past Surgical History:  Procedure Laterality Date  . CARDIAC CATHETERIZATION  02/08/07   widely patent stent in the circumflex and LAD.  RCA had a mid lesion and then was totally occluded distally. Cook bare-metal 3.5x20 mm stent  . NM MYOVIEW LTD  01/09/1999   Inferior wall thinning with possible mild ischemia versus infarct. This would correlate well with his known RCA occlusion  . TRANSTHORACIC ECHOCARDIOGRAM  08/06/11; 08/2014   a) EF 55% to 65% with grade2 DD/pseudonormal w/ mildly dilated LA with high LAP/LVEDP; b) EF 60-65%, mod Conc LVH, cannot assess Diastolic Fxn.  Mild AS. Mild MVP without MR., Mod LA/RA dilation.  . TRANSURETHRAL RESECTION OF PROSTATE  12/2010       Home Medications    Prior to Admission medications   Medication Sig Start Date End Date Taking? Authorizing Provider  Ascorbic Acid (VITAMIN C) 1000 MG tablet Take 1,000 mg by mouth daily.   Yes Historical Provider, MD  aspirin EC 81 MG tablet Take 81 mg by mouth daily.   Yes Historical Provider, MD  atorvastatin (LIPITOR) 20 MG tablet Take 20 mg by mouth daily. 08/11/13  Yes Historical Provider, MD  azithromycin (ZITHROMAX) 500 MG tablet Take 1 tablet (500 mg total) by mouth daily. For 2 more days 02/19/16  Yes Bonnielee Haff, MD  CARTIA XT 180 MG 24 hr capsule Take 180 mg by mouth daily.  04/13/15  Yes Historical Provider, MD  cefdinir (OMNICEF) 300 MG capsule Take 1 capsule (300 mg total) by mouth 2 (two) times daily. For 3 more days 02/19/16  Yes Bonnielee Haff, MD  Cholecalciferol (VITAMIN D) 2000 UNITS CAPS Take 2,000 Units by mouth every morning.    Yes Historical Provider, MD  feeding supplement, ENSURE ENLIVE,  (ENSURE ENLIVE) LIQD Take 237 mLs by mouth 2 (two) times daily between meals. 08/14/14  Yes Florencia Reasons, MD  ferrous sulfate 325 (65 FE) MG tablet Take 325 mg by mouth daily with breakfast.   Yes Historical Provider, MD  furosemide (LASIX) 20 MG tablet Take 1 tablet (20 mg total) by mouth every morning. RESUME ON 02/22/16 02/19/16  Yes Bonnielee Haff, MD  gabapentin (NEURONTIN) 100 MG capsule Take 100 mg by mouth at bedtime.  11/23/13  Yes Historical Provider, MD  HYDROcodone-acetaminophen (NORCO/VICODIN) 5-325 MG tablet Take 1 tablet by mouth 2 (two) times daily as needed. Take 1 tab bid or tid prn Patient taking differently: Take 1 tablet by mouth 2 (two) times daily as needed for moderate pain.  01/03/16  Yes Jessy Oto, MD  latanoprost (XALATAN) 0.005 % ophthalmic solution Place 1 drop into both eyes at bedtime.  02/17/14  Yes Historical Provider, MD  levETIRAcetam (KEPPRA)  500 MG tablet Take 1 tablet (500 mg total) by mouth every 12 (twelve) hours. 01/18/15  Yes Dennie Bible, NP  metoprolol tartrate (LOPRESSOR) 25 MG tablet Take 1 tablet (25 mg total) by mouth every evening. 02/19/16  Yes Bonnielee Haff, MD  polyethylene glycol (MIRALAX / GLYCOLAX) packet Take 17 g by mouth daily.   Yes Historical Provider, MD  predniSONE (DELTASONE) 10 MG tablet Take 5 mg by mouth daily with breakfast.    Yes Historical Provider, MD  VASCEPA 1 G CAPS Take 2 g by mouth 2 (two) times daily.  07/29/14  Yes Historical Provider, MD  warfarin (COUMADIN) 5 MG tablet Take 1-1.5 tablets (5-7.5 mg total) by mouth See admin instructions. Takes 76m on sun only, takes 7.559mall other days.  PLEASE RESUME ON 02/22/16. 02/19/16  Yes GoBonnielee HaffMD    Family History Family History  Problem Relation Age of Onset  . Heart disease Mother   . Heart attack Father [id number]  Social History Social History  Substance Use Topics  . Smoking status: Former Smoker    Packs/day: 1.00    Years: 10.00    Types: Cigarettes     Quit date: 03/08/1988  . Smokeless tobacco: Never Used     Comment: smoked cigars and cigarettes  . Alcohol use No     Allergies   Patient has no known allergies.   Review of Systems Review of Systems  Unable to review systems due to encephalopathy Physical Exam Updated Vital Signs BP 150/71   Pulse 90   Temp 98.1 F (36.7 C) (Oral)   Resp 24   SpO2 99%   Physical Exam  Constitutional: He appears well-developed and well-nourished.  Patient makes poor eye contact.  HENT:  Head: Normocephalic and atraumatic.  Eyes: EOM are normal. Pupils are equal, round, and reactive to light.  Neck: No JVD present.  Cardiovascular: Normal rate, regular rhythm and intact distal pulses.   Murmur heard. Loud systolic murmur heard best over the left second ICS.  Pulmonary/Chest: Effort normal and breath sounds normal.  Abdominal: Soft.  Neurological: He is alert.  Alert, only oriented to self. Confused verbal responses at times, and had difficulty following commands.  Cranial nerves appear intact, sensation is hyperesthetic in the right leg but normal elsewhere. No weakness to plantar or dorsi flexion. No weakness in the upper extremities. No overt facial droop. Gait is deferred.  Skin: Skin is warm and dry.  Nursing note and vitals reviewed.    ED Treatments / Results  Labs (all labs ordered are listed, but only abnormal results are displayed) Labs Reviewed  BASIC METABOLIC PANEL - Abnormal; Notable for the following:       Result Value   Glucose, Bld 104 (*)    Calcium 8.1 (*)    GFR calc non Af Amer 56 (*)    All other components within normal limits  CBC - Abnormal; Notable for the following:    WBC 23.2 (*)    RBC 3.06 (*)    Hemoglobin 8.4 (*)    HCT 27.0 (*)    RDW 19.1 (*)    Platelets 93 (*)    All other components within normal limits  URINALYSIS, ROUTINE W REFLEX MICROSCOPIC - Abnormal; Notable for the following:    APPearance HAZY (*)    Hgb urine dipstick  SMALL (*)    Protein, ur 100 (*)    Squamous Epithelial / LPF 0-5 (*)    All  other components within normal limits  HEPATIC FUNCTION PANEL - Abnormal; Notable for the following:    Total Protein 8.2 (*)    Albumin 2.4 (*)    AST 94 (*)    ALT 110 (*)    All other components within normal limits  URINALYSIS, ROUTINE W REFLEX MICROSCOPIC - Abnormal; Notable for the following:    APPearance HAZY (*)    Protein, ur 100 (*)    All other components within normal limits  PROTIME-INR - Abnormal; Notable for the following:    Prothrombin Time 22.4 (*)    All other components within normal limits  CK - Abnormal; Notable for the following:    Total CK 772 (*)    All other components within normal limits  AMMONIA  CBC WITH DIFFERENTIAL/PLATELET  CBG MONITORING, ED  I-STAT CG4 LACTIC ACID, ED  I-STAT TROPOININ, ED  I-STAT CG4 LACTIC ACID, ED    EKG  EKG Interpretation None       Radiology Dg Chest 2 View  Result Date: 02/21/2016 CLINICAL DATA:  Fall with weakness. EXAM: CHEST  2 VIEW COMPARISON:  02/15/2016 FINDINGS: Two views study limited by positioning. Volumes are low. The cardio pericardial silhouette is enlarged. Left base collapse/ consolidation again noted with potential small left pleural effusion. New nodular density in the right mid lung may be confluence of shadows or reflect pneumonia. Pulmonary vascular congestion noted. The visualized bony structures of the thorax are intact. IMPRESSION: Cardiomegaly with vascular congestion and persistent retrocardiac collapse/ consolidation. New nodular density right mid lung may represent confluence of shadows or infectious/inflammatory process. Electronically Signed   By: Misty Stanley M.D.   On: 02/21/2016 15:09   Dg Lumbar Spine Complete  Result Date: 02/21/2016 CLINICAL DATA:  Fall, back pain EXAM: LUMBAR SPINE - COMPLETE 4+ VIEW COMPARISON:  10/27/2015 FINDINGS: Normal alignment of the vertebral bodies. There is endplate  sclerosis and osteophytosis. There is joint space narrowing at L4-L5 and L5-S1. No subluxation. Atherosclerotic calcification of the aorta. IMPRESSION: 1. No acute findings lumbar spine.  No change from prior. 2. Multilevel disc osteophytic disease. Electronically Signed   By: Suzy Bouchard M.D.   On: 02/21/2016 15:03   Ct Head Wo Contrast  Result Date: 02/21/2016 CLINICAL DATA:  Fall; pt found on floor this AM by family, increased confusion, generalized weakness EXAM: CT HEAD WITHOUT CONTRAST TECHNIQUE: Contiguous axial images were obtained from the base of the skull through the vertex without intravenous contrast. COMPARISON:  07/15/2013 FINDINGS: Brain: Old left parietal infarct and encephalomalacia as before. Lacunar infarct involving anterior limb right internal capsule as before. Diffuse parenchymal atrophy. Patchy areas of hypoattenuation in deep and periventricular white matter bilaterally. Negative for acute intracranial hemorrhage, mass lesion, acute infarction, midline shift, or mass-effect. Acute infarct may be inapparent on noncontrast CT. Ventricles and sulci symmetric. Vascular: Atherosclerotic and physiologic intracranial calcifications. Skull: Bone windows demonstrate no focal lesion. Sinuses/Orbits: Mucoperiosteal thickening in bilateral maxillary sinuses. Orbits unremarkable. Other: None. IMPRESSION: 1. Negative for bleed or other acute intracranial process. 2. Remote infarcts, nonspecific white matter changes, and parenchymal atrophy as before. 3. Bilateral maxillary sinus disease. Electronically Signed   By: Lucrezia Europe M.D.   On: 02/21/2016 15:10    Procedures Procedures (including critical care time)  Medications Ordered in ED Medications  ceFEPIme (MAXIPIME) 1 g in dextrose 5 % 50 mL IVPB (1 g Intravenous New Bag/Given 02/21/16 1548)  vancomycin (VANCOCIN) 1,500 mg in sodium chloride 0.9 % 500 mL IVPB (not  administered)  0.9 %  sodium chloride infusion (1,000 mLs Intravenous  New Bag/Given 02/21/16 1340)     Initial Impression / Assessment and Plan / ED Course  I have reviewed the triage vital signs and the nursing notes.  Pertinent labs & imaging results that were available during my care of the patient were reviewed by me and considered in my medical decision making (see chart for details).  Clinical Course as of Feb 21 1603  Mon Feb 21, 2016  1546 CK Total: (!) 772 [AH]    Clinical Course User Index [AH] Margarita Mail, PA-C    Patient appears to have failed treatment for his community-acquired pneumonia. Patient will be admitted for age, encephalopathy. His CK is elevated. He will receive fluids. I have ordered treatment with cefepime and vancomycin. Patient stable throughout his visit. He will be admitted by Dr. Wynetta Emery. [place] safe for admission at this time  Final Clinical Impressions(s) / ED Diagnoses   Final diagnoses:  HCAP (healthcare-associated pneumonia)  Encephalopathy acute  Elevated CK  Leukocytosis, unspecified type    New Prescriptions New Prescriptions   No medications on file     Margarita Mail, PA-C 02/21/16 [place], MD 02/21/16 [phone]

## 2016-02-21 NOTE — ED Triage Notes (Signed)
Pt has ongoing back pain and generalized weakness. Family reports they found him in the floor this morning. Pt has also had increased confusion per family. Pt a&o X3 currently.

## 2016-02-21 NOTE — ED Notes (Signed)
Patient transported to CT 

## 2016-02-21 NOTE — H&P (Signed)
History and Physical  [name]. VEH:[id number] DOB: 1928/09/15 DOA: 02/21/2016  Referring physician: Kenton Kingfisher, MD PCP: [name], MD   Chief Complaint: found down at home unknown time  HPI: Terry Robertson. is a 82 y.o. male recently discharged 2 days ago after being admitted for Community acquired pneumonia who has a past medical history of myelodysplastic syndrome, spinal stenosis with neurogenic claudication, history of CAD, seizures and others detailed below.  The patient apparently fell down at home and his family found him on the floor early this morning.  He says that he does not know how long he had been down on the floor.  He is having an exacerbation of his chronic back pain.  The patient was noted to have slightly worsening anemia and a heme positive stool during his last admission admission.  No colonoscopy done during the visit after consultation with GI. The patient is attended by his family at home but they can't be with him 24/7. His family gives the history because the patient was initially confused and encephalopathic but improved in time with IV fluid hydration.  They found him on the floor of his house today for an unknown period of time. He is complaining of severe pack pain and inability to walk. The patient is unsure if it's because his legs are too weak or his back hurts too much.  He has a history of chronic stenosis with neurogenic claudication. His family states that he had an injury to the back in the 1970s and has had chronic progressively worsening back pain since that time. They state that he has always been able to walk, however, until this morning. Last night they were taking care of the patient and he was unable to get out of his chair because of back pain. He has not had any repeat fevers or worsening shortness of breath. No apparent neurologic abnormalities.   In ED: Pt was noted to have worsening leukocytosis ( he was discharged on prednisone ) and xray  findings of slightly worse pulmonary disease concerning for HCAP given his recent admission.  In addition, he was confused, dehydrated with elevated CK levels.  He is being admitted for further evaluation and management.  Review of Systems: Unable to obtain from patient as he is encephalopathic  Past Medical History:  Diagnosis Date  . Aortic stenosis, mild 08/10/2014   : EF 60-65%, mod Conc LVH, cannot assess Diastolic Fxn.  Mild AS. Mild MVP without MR., Mod LA/RA dilation.  . Asbestosis [facility])    family unclear where he was evaluated in the past  . BPH (benign prostatic hyperplasia)    TURP in 12/2010  . CAD S/P percutaneous coronary angioplasty 1997   status post remote PCI to the LAD and circumflex in 1997, heart cath in 2008 showed widely patent stents in the circumflex and LAD.  RCA had a mid lesion and then was totally occluded distally.  . CHF (congestive heart failure) ([place])   . Diabetes mellitus   . Essential hypertension   . MGUS (monoclonal gammopathy of unknown significance) 09/21/2014  . Multiple myeloma ([place]) 09/29/2014  . Myelodysplastic syndrome (HCC)    w/mild anemia an neutropenia  . Obesity, morbid ([place])    BMI  . OSA on CPAP   . Paroxysmal atrial fibrillation (HCC)    on coumadin; Diagnosed at the time of his 1st CVA.  CHA2DS2VASC = 6. On  Warfarin  . Seizures ([place])    Post CVA; on  Keppra; stable  . ST-segment elevation myocardial infarction (STEMI) of inferior wall ([place]) 1997   inferior MI with left circumflex stent  . Stroke ([place]) 08/2011, 03/2012   L MCA (Afib RVR while admitted for hypotension) - expressive aphasia (almost totally recovered)   Past Surgical History:  Procedure Laterality Date  . CARDIAC CATHETERIZATION  02/08/07   widely patent stent in the circumflex and LAD.  RCA had a mid lesion and then was totally occluded distally. Cook bare-metal 3.5x20 mm stent  . NM MYOVIEW LTD  01/09/1999   Inferior wall thinning with possible mild ischemia versus  infarct. This would correlate well with his known RCA occlusion  . TRANSTHORACIC ECHOCARDIOGRAM  08/06/11; 08/2014   a) EF 55% to 65% with grade2 DD/pseudonormal w/ mildly dilated LA with high LAP/LVEDP; b) EF 60-65%, mod Conc LVH, cannot assess Diastolic Fxn.  Mild AS. Mild MVP without MR., Mod LA/RA dilation.  . TRANSURETHRAL RESECTION OF PROSTATE  12/2010   Social History:  reports that he quit smoking about 27 years ago. His smoking use included Cigarettes. He has a 10.00 pack-year smoking history. He has never used smokeless tobacco. He reports that he does not drink alcohol or use drugs.   No Known Allergies  Family History  Problem Relation Age of Onset  . Heart disease Mother   . Heart attack Father 63    Prior to Admission medications   Medication Sig Start Date End Date Taking? Authorizing Provider  Ascorbic Acid (VITAMIN C) 1000 MG tablet Take 1,000 mg by mouth daily.   Yes Historical Provider, MD  aspirin EC 81 MG tablet Take 81 mg by mouth daily.   Yes Historical Provider, MD  atorvastatin (LIPITOR) 20 MG tablet Take 20 mg by mouth daily. 08/11/13  Yes Historical Provider, MD  azithromycin (ZITHROMAX) 500 MG tablet Take 1 tablet (500 mg total) by mouth daily. For 2 more days 02/19/16  Yes Bonnielee Haff, MD  CARTIA XT 180 MG 24 hr capsule Take 180 mg by mouth daily.  04/13/15  Yes Historical Provider, MD  cefdinir (OMNICEF) 300 MG capsule Take 1 capsule (300 mg total) by mouth 2 (two) times daily. For 3 more days 02/19/16  Yes Bonnielee Haff, MD  Cholecalciferol (VITAMIN D) 2000 UNITS CAPS Take 2,000 Units by mouth every morning.    Yes Historical Provider, MD  feeding supplement, ENSURE ENLIVE, (ENSURE ENLIVE) LIQD Take 237 mLs by mouth 2 (two) times daily between meals. 08/14/14  Yes Florencia Reasons, MD  ferrous sulfate 325 (65 FE) MG tablet Take 325 mg by mouth daily with breakfast.   Yes Historical Provider, MD  furosemide (LASIX) 20 MG tablet Take 1 tablet (20 mg total) by mouth every  morning. RESUME ON 02/22/16 02/19/16  Yes Bonnielee Haff, MD  gabapentin (NEURONTIN) 100 MG capsule Take 100 mg by mouth at bedtime.  11/23/13  Yes Historical Provider, MD  HYDROcodone-acetaminophen (NORCO/VICODIN) 5-325 MG tablet Take 1 tablet by mouth 2 (two) times daily as needed. Take 1 tab bid or tid prn Patient taking differently: Take 1 tablet by mouth 2 (two) times daily as needed for moderate pain.  01/03/16  Yes Jessy Oto, MD  latanoprost (XALATAN) 0.005 % ophthalmic solution Place 1 drop into both eyes at bedtime.  02/17/14  Yes Historical Provider, MD  levETIRAcetam (KEPPRA) 500 MG tablet Take 1 tablet (500 mg total) by mouth every 12 (twelve) hours. 01/18/15  Yes Dennie Bible, NP  metoprolol tartrate (LOPRESSOR) 25 MG tablet  Take 1 tablet (25 mg total) by mouth every evening. 02/19/16  Yes Bonnielee Haff, MD  polyethylene glycol (MIRALAX / GLYCOLAX) packet Take 17 g by mouth daily.   Yes Historical Provider, MD  predniSONE (DELTASONE) 10 MG tablet Take 5 mg by mouth daily with breakfast.    Yes Historical Provider, MD  VASCEPA 1 G CAPS Take 2 g by mouth 2 (two) times daily.  07/29/14  Yes Historical Provider, MD  warfarin (COUMADIN) 5 MG tablet Take 1-1.5 tablets (5-7.5 mg total) by mouth See admin instructions. Takes 37m on sun only, takes 7.516mall other days.  PLEASE RESUME ON 02/22/16. 02/19/16  Yes GoBonnielee HaffMD   Physical Exam: Vitals:   02/21/16 1530 02/21/16 1545 02/21/16 1600 02/21/16 1615  BP: 150/71 146/77 155/78 159/72  Pulse: 90 91 90 90  Resp: '24 25 24 23  ' Temp:      TempSrc:      SpO2: 99% 99% 99% 100%    Constitutional: He appears well-developed and well-nourished.  Patient makes poor eye contact.  HENT: Head: Normocephalic and atraumatic.  Eyes: EOM are normal. Pupils are equal, round, and reactive to light.  Neck: No JVD present.  Cardiovascular: Normal rate, regular rhythm and intact distal pulses.  Murmur heard. Loud systolic murmur heard  best over the left second ICS.  Pulmonary/Chest: Effort normal and breath sounds normal.  Abdominal: Soft.  Neurological: He is alert.  Alert, only oriented to self. Confused verbal responses at times, and had difficulty following commands. Cranial nerves appear intact, sensation is hyperesthetic in the right leg but normal elsewhere. No weakness to plantar or dorsi flexion. No weakness in the upper extremities. No overt facial droop. Gait is deferred.  Skin: Skin is warm and dry.   Labs on Admission:  Basic Metabolic Panel:  Recent Labs Lab 02/16/16 0446 02/17/16 0328 02/18/16 0403 02/19/16 0332 02/21/16 1106  NA 139 138 139 142 138  K 4.0 4.0 4.1 3.6 3.5  CL 108 105 106 108 106  CO2 21* '24 26 26 22  ' GLUCOSE 125* 205* 162* 111* 104*  BUN 10 16 29* 23* 10  CREATININE 1.13 1.32* 1.40* 1.18 1.16  CALCIUM 7.9* 8.0* 8.2* 8.2* 8.1*   Liver Function Tests:  Recent Labs Lab 02/15/16 1744 02/21/16 1315  AST 35 94*  ALT 25 110*  ALKPHOS 44 46  BILITOT 0.7 0.8  PROT 8.9* 8.2*  ALBUMIN 2.8* 2.4*   No results for input(s): LIPASE, AMYLASE in the last 168 hours.  Recent Labs Lab 02/21/16 1317  AMMONIA 19   CBC:  Recent Labs Lab 02/15/16 1744  02/17/16 0328 02/18/16 0403 02/19/16 0332 02/21/16 1106 02/21/16 1601  WBC 5.5  < > 9.9 12.7* 14.7* 23.2* 21.6*  NEUTROABS 0.8*  --   --   --   --   --  PENDING  HGB 9.9*  < > 9.1* 8.3* 8.2* 8.4* 8.6*  HCT 32.5*  < > 30.3* 27.2* 27.2* 27.0* 28.3*  MCV 91.5  < > 91.0 88.9 91.6 88.2 89.0  PLT 41*  < > 45* 55* 67* 93* 83*  < > = values in this interval not displayed. Cardiac Enzymes:  Recent Labs Lab 02/21/16 1339  CKTOTAL 772*    BNP (last 3 results) No results for input(s): PROBNP in the last 8760 hours. CBG:  Recent Labs Lab 02/21/16 1354  GLUCAP 91    Radiological Exams on Admission: Dg Chest 2 View  Result Date: 02/21/2016 CLINICAL DATA:  Fall with weakness. EXAM: CHEST  2 VIEW COMPARISON:  02/15/2016  FINDINGS: Two views study limited by positioning. Volumes are low. The cardio pericardial silhouette is enlarged. Left base collapse/ consolidation again noted with potential small left pleural effusion. New nodular density in the right mid lung may be confluence of shadows or reflect pneumonia. Pulmonary vascular congestion noted. The visualized bony structures of the thorax are intact. IMPRESSION: Cardiomegaly with vascular congestion and persistent retrocardiac collapse/ consolidation. New nodular density right mid lung may represent confluence of shadows or infectious/inflammatory process. Electronically Signed   By: Misty Stanley M.D.   On: 02/21/2016 15:09   Dg Lumbar Spine Complete  Result Date: 02/21/2016 CLINICAL DATA:  Fall, back pain EXAM: LUMBAR SPINE - COMPLETE 4+ VIEW COMPARISON:  10/27/2015 FINDINGS: Normal alignment of the vertebral bodies. There is endplate sclerosis and osteophytosis. There is joint space narrowing at L4-L5 and L5-S1. No subluxation. Atherosclerotic calcification of the aorta. IMPRESSION: 1. No acute findings lumbar spine.  No change from prior. 2. Multilevel disc osteophytic disease. Electronically Signed   By: Suzy Bouchard M.D.   On: 02/21/2016 15:03   Ct Head Wo Contrast  Result Date: 02/21/2016 CLINICAL DATA:  Fall; pt found on floor this AM by family, increased confusion, generalized weakness EXAM: CT HEAD WITHOUT CONTRAST TECHNIQUE: Contiguous axial images were obtained from the base of the skull through the vertex without intravenous contrast. COMPARISON:  07/15/2013 FINDINGS: Brain: Old left parietal infarct and encephalomalacia as before. Lacunar infarct involving anterior limb right internal capsule as before. Diffuse parenchymal atrophy. Patchy areas of hypoattenuation in deep and periventricular white matter bilaterally. Negative for acute intracranial hemorrhage, mass lesion, acute infarction, midline shift, or mass-effect. Acute infarct may be inapparent  on noncontrast CT. Ventricles and sulci symmetric. Vascular: Atherosclerotic and physiologic intracranial calcifications. Skull: Bone windows demonstrate no focal lesion. Sinuses/Orbits: Mucoperiosteal thickening in bilateral maxillary sinuses. Orbits unremarkable. Other: None. IMPRESSION: 1. Negative for bleed or other acute intracranial process. 2. Remote infarcts, nonspecific white matter changes, and parenchymal atrophy as before. 3. Bilateral maxillary sinus disease. Electronically Signed   By: Lucrezia Europe M.D.   On: 02/21/2016 15:10   EKG: Independently reviewed.   Assessment/Plan Principal Problem:   HCAP (healthcare-associated pneumonia) Active Problems:   CAD PCI to LAD/CFX in 1997. Cath '08- medical Rx   Myelodysplastic syndrome ([place])   Essential hypertension   DM type 2 (diabetes mellitus, type 2) (HCC)   Diastolic dysfunction: Grade 2   Cerebral infarction ([place])   Long term current use of anticoagulant therapy   Left ventricular diastolic dysfunction, NYHA class 2 (grade 2 dysfunction with elevated filling pressures)   MGUS (monoclonal gammopathy of unknown significance)   Multiple myeloma in remission ([place])   Thrombocytopenia ([place])   Leukocytosis   Fall at home   1. Fall at home - likely multifactorial given his advanced age and many chronic medical co-morbidities.  He was down for unknown period of time and now has elevated CK levels.  He is being treated with IV fluid hydration, plan to recheck CK levels in AM.  PT/OT consult requested.   No acute findings on lumbar xrays.  CT head negative for acute findings. 2. Dehydration - treating with IV fluids as above.  3. MDS - chronic - followed by heme/onc Dr. Alvy Bimler.  He will follow up with them outpatient.  4. Grade 2 diastolic dysfunction - currently compensated.   5. Afib with chronic anticoagulation with warfarin - asked for pharmacist to  assist with managing warfarin in hospital. 6. Essential hypertension - resume home  medications and follow.  7. Cerebrovascular Disease - stable, follow, continue measures for secondary prevention.  8. Thrombocytopenia - chronic, follow, no acute changes from recent testing.  9.  Leukocytosis - likely secondary to being on steroids, possibly due to lung infection 10. HCAP - empirically treating for HCAP given recent admission, see orders and antibiotic protocol.  Pt doesn't have any symptoms of cough, SOB or fever.  11. DM type 2 - diet controlled, will follow in hospital and treat as needed.  Check A1c.  12. CAD - stable, compensated, no chest pain, normal troponin I.  EKG: no acute findings.     DVT Prophylaxis: warfarin/ted hoses Code Status: full   Family Communication: bedside  Disposition Plan: TBD   Time spent: 28 mins  Irwin Brakeman, MD [facility] Pager [phone]  If 7PM-7AM, please contact night-coverage [email or web address] Password [facility] 02/21/2016, 4:56 PM

## 2016-02-21 NOTE — ED Notes (Addendum)
Lab called to report increase in Atypical Mononuclear cells reported to Margarita Mail, PA and Wynetta Emery, MD

## 2016-02-21 NOTE — ED Notes (Signed)
Pt provided urinal and made aware of need for urine sample

## 2016-02-21 NOTE — ED Notes (Signed)
Patient's O2 sat on room air 87%, O2 sat improved to 99% on 2 L [place]

## 2016-02-21 NOTE — Progress Notes (Signed)
Pharmacy Antibiotic Note  Terry Robertson. is a 82 y.o. male admitted on 02/21/2016 with pneumonia.  Pharmacy has been consulted for vancomycin dosing.  Patient received vancomycin 1500mg  and cefepime 1g IV once in the ED.  Plan: Vancomycin 1250mg  IV every 24 hours.  Goal trough 15-20 mcg/mL.  F/u cefepime dosing; recommend 1g IV q8h Monitor culture data, renal function and clinical course VT at SS prn     Temp (24hrs), Avg:98.1 F (36.7 C), Min:98.1 F (36.7 C), Max:98.1 F (36.7 C)   Recent Labs Lab 02/15/16 1808  02/16/16 0446 02/17/16 0328 02/18/16 0403 02/19/16 0332 02/21/16 1106  WBC  --   < > 7.2 9.9 12.7* 14.7* 23.2*  CREATININE  --   --  1.13 1.32* 1.40* 1.18 1.16  LATICACIDVEN 1.74  --   --   --   --   --   --   < > = values in this interval not displayed.  Estimated Creatinine Clearance: 53.3 mL/min (by C-G formula based on SCr of 1.16 mg/dL).    No Known Allergies  Antimicrobials this admission: Cefepime 12/18 >>  Vanc 12/18 >>   Dose adjustments this admission: n/a  Microbiology results:  BCx:   UCx:    Sputum:    MRSA PCR:    Terry Robertson. Diona Foley, PharmD, [place] Clinical Pharmacist Pager [phone] 02/21/2016 3:25 PM

## 2016-02-21 NOTE — Progress Notes (Signed)
ANTICOAGULATION CONSULT NOTE - Initial Consult  Pharmacy Consult for Coumadin Indication: atrial fibrillation  No Known Allergies  Patient Measurements:    Vital Signs: Temp: 98.1 F (36.7 C) (12/18 1045) Temp Source: Oral (12/18 1045) BP: 163/81 (12/18 1830) Pulse Rate: 89 (12/18 1830)  Labs:  Recent Labs  02/19/16 0332 02/21/16 1106 02/21/16 1315 02/21/16 1339 02/21/16 1601  HGB 8.2* 8.4*  --   --  8.6*  HCT 27.2* 27.0*  --   --  28.3*  PLT 67* 93*  --   --  83*  LABPROT 29.2*  --  22.4*  --   --   INR 2.70  --  1.93  --   --   CREATININE 1.18 1.16  --   --   --   CKTOTAL  --   --   --  772*  --     Estimated Creatinine Clearance: 53.3 mL/min (by C-G formula based on SCr of 1.16 mg/dL).   Medical History: Past Medical History:  Diagnosis Date  . Aortic stenosis, mild 08/10/2014   : EF 60-65%, mod Conc LVH, cannot assess Diastolic Fxn.  Mild AS. Mild MVP without MR., Mod LA/RA dilation.  . Asbestosis [facility])    family unclear where he was evaluated in the past  . BPH (benign prostatic hyperplasia)    TURP in 12/2010  . CAD S/P percutaneous coronary angioplasty 1997   status post remote PCI to the LAD and circumflex in 1997, heart cath in 2008 showed widely patent stents in the circumflex and LAD.  RCA had a mid lesion and then was totally occluded distally.  . CHF (congestive heart failure) ([place])   . Diabetes mellitus   . Essential hypertension   . MGUS (monoclonal gammopathy of unknown significance) 09/21/2014  . Multiple myeloma ([place]) 09/29/2014  . Myelodysplastic syndrome (HCC)    w/mild anemia an neutropenia  . Obesity, morbid ([place])    BMI  . OSA on CPAP   . Paroxysmal atrial fibrillation (HCC)    on coumadin; Diagnosed at the time of his 1st CVA.  CHA2DS2VASC = 6. On  Warfarin  . Seizures ([place])    Post CVA; on Keppra; stable  . ST-segment elevation myocardial infarction (STEMI) of inferior wall ([place]) 1997   inferior MI with left circumflex stent  .  Stroke ([place]) 08/2011, 03/2012   L MCA (Afib RVR while admitted for hypotension) - expressive aphasia (almost totally recovered)    Medications:  Prescriptions Prior to Admission  Medication Sig Dispense Refill Last Dose  . Ascorbic Acid (VITAMIN C) 1000 MG tablet Take 1,000 mg by mouth daily.   02/20/2016 at Unknown time  . aspirin EC 81 MG tablet Take 81 mg by mouth daily.   02/20/2016 at Unknown time  . atorvastatin (LIPITOR) 20 MG tablet Take 20 mg by mouth daily.   02/20/2016 at Unknown time  . azithromycin (ZITHROMAX) 500 MG tablet Take 1 tablet (500 mg total) by mouth daily. For 2 more days 2 tablet 0 02/20/2016 at Unknown time  . CARTIA XT 180 MG 24 hr capsule Take 180 mg by mouth daily.    02/20/2016 at Unknown time  . cefdinir (OMNICEF) 300 MG capsule Take 1 capsule (300 mg total) by mouth 2 (two) times daily. For 3 more days 6 capsule 0 02/20/2016 at Unknown time  . Cholecalciferol (VITAMIN D) 2000 UNITS CAPS Take 2,000 Units by mouth every morning.    02/20/2016 at Unknown time  . feeding supplement,  ENSURE ENLIVE, (ENSURE ENLIVE) LIQD Take 237 mLs by mouth 2 (two) times daily between meals. 237 mL 12 Past Month at Unknown time  . ferrous sulfate 325 (65 FE) MG tablet Take 325 mg by mouth daily with breakfast.   02/20/2016 at Unknown time  . furosemide (LASIX) 20 MG tablet Take 1 tablet (20 mg total) by mouth every morning. RESUME ON 02/22/16 30 tablet  02/20/2016 at Unknown time  . gabapentin (NEURONTIN) 100 MG capsule Take 100 mg by mouth at bedtime.    02/20/2016 at Unknown time  . HYDROcodone-acetaminophen (NORCO/VICODIN) 5-325 MG tablet Take 1 tablet by mouth 2 (two) times daily as needed. Take 1 tab bid or tid prn (Patient taking differently: Take 1 tablet by mouth 2 (two) times daily as needed for moderate pain. ) 60 tablet 0 Past Week at Unknown time  . latanoprost (XALATAN) 0.005 % ophthalmic solution Place 1 drop into both eyes at bedtime.   4 02/20/2016 at Unknown time  .  levETIRAcetam (KEPPRA) 500 MG tablet Take 1 tablet (500 mg total) by mouth every 12 (twelve) hours. 180 tablet 3 02/20/2016 at Unknown time  . metoprolol tartrate (LOPRESSOR) 25 MG tablet Take 1 tablet (25 mg total) by mouth every evening. 60 tablet 0 02/20/2016 at Unknown time  . polyethylene glycol (MIRALAX / GLYCOLAX) packet Take 17 g by mouth daily.   02/20/2016 at Unknown time  . predniSONE (DELTASONE) 10 MG tablet Take 5 mg by mouth daily with breakfast.    02/20/2016 at Unknown time  . VASCEPA 1 G CAPS Take 2 g by mouth 2 (two) times daily.   2 02/20/2016 at Unknown time  . warfarin (COUMADIN) 5 MG tablet Take 1-1.5 tablets (5-7.5 mg total) by mouth See admin instructions. Takes 2m on sun only, takes 7.542mall other days.  PLEASE RESUME ON 02/22/16.   02/20/2016 at 1800    Assessment: [age] M presented to ED 02/21/2016 with pneumonia and was started on IV antibiotics.  Pt also noted to be on Coumadin PTA for hx afib.  INR slightly < goal.  Will give booseted Coumadin dose this evening.  Home Coumadin dose = 7.11m711mO daily except 11mg53m Sundays.  Last dose 12/17.  Goal of Therapy:  INR 2-3 Monitor platelets by anticoagulation protocol: Yes   Plan:  Coumadin 10 mg PO x 1 tonight Daily INR  [employer]imb[employer] Incarm.D., BCPS Clinical Pharmacist Pager [phone]/2017 9:03 PM

## 2016-02-21 NOTE — ED Notes (Signed)
Family at bedside. 

## 2016-02-22 ENCOUNTER — Encounter (HOSPITAL_COMMUNITY): Payer: Self-pay | Admitting: General Practice

## 2016-02-22 ENCOUNTER — Other Ambulatory Visit: Payer: Self-pay

## 2016-02-22 LAB — CBC WITH DIFFERENTIAL/PLATELET
BASOS ABS: 0 10*3/uL (ref 0.0–0.1)
BLASTS: 0 %
Band Neutrophils: 4 %
Basophils Relative: 0 %
Eosinophils Absolute: 0 10*3/uL (ref 0.0–0.7)
Eosinophils Relative: 0 %
HEMATOCRIT: 26.3 % — AB (ref 39.0–52.0)
HEMOGLOBIN: 7.9 g/dL — AB (ref 13.0–17.0)
Lymphocytes Relative: 16 %
Lymphs Abs: 4 10*3/uL (ref 0.7–4.0)
MCH: 27.4 pg (ref 26.0–34.0)
MCHC: 30 g/dL (ref 30.0–36.0)
MCV: 91.3 fL (ref 78.0–100.0)
METAMYELOCYTES PCT: 1 %
Monocytes Absolute: 0.5 10*3/uL (ref 0.1–1.0)
Monocytes Relative: 2 %
Myelocytes: 1 %
Neutro Abs: 9.5 10*3/uL — ABNORMAL HIGH (ref 1.7–7.7)
Neutrophils Relative %: 32 %
Other: 44 %
Platelets: 80 10*3/uL — ABNORMAL LOW (ref 150–400)
Promyelocytes Absolute: 0 %
RBC: 2.88 MIL/uL — AB (ref 4.22–5.81)
RDW: 19.8 % — ABNORMAL HIGH (ref 11.5–15.5)
WBC: 24.9 10*3/uL — AB (ref 4.0–10.5)
nRBC: 0 /100 WBC

## 2016-02-22 LAB — COMPREHENSIVE METABOLIC PANEL
ALT: 82 U/L — AB (ref 17–63)
AST: 69 U/L — AB (ref 15–41)
Albumin: 2.1 g/dL — ABNORMAL LOW (ref 3.5–5.0)
Alkaline Phosphatase: 44 U/L (ref 38–126)
Anion gap: 9 (ref 5–15)
BUN: 7 mg/dL (ref 6–20)
CO2: 22 mmol/L (ref 22–32)
CREATININE: 1 mg/dL (ref 0.61–1.24)
Calcium: 7.6 mg/dL — ABNORMAL LOW (ref 8.9–10.3)
Chloride: 108 mmol/L (ref 101–111)
GFR calc Af Amer: >60 mL/min (ref >60)
Glucose, Bld: 80 mg/dL (ref 65–99)
POTASSIUM: 3.6 mmol/L (ref 3.5–5.1)
Sodium: 139 mmol/L (ref 135–145)
TOTAL PROTEIN: 7.4 g/dL (ref 6.5–8.1)
Total Bilirubin: 0.9 mg/dL (ref 0.3–1.2)

## 2016-02-22 LAB — GLUCOSE, CAPILLARY
Glucose-Capillary: 79 mg/dL (ref 65–99)
Glucose-Capillary: 112 mg/dL — ABNORMAL HIGH (ref 65–99)
Glucose-Capillary: 134 mg/dL — ABNORMAL HIGH (ref 65–99)

## 2016-02-22 LAB — HEMOGLOBIN A1C
HEMOGLOBIN A1C: 6.3 % — AB (ref 4.8–5.6)
Mean Plasma Glucose: 134 mg/dL

## 2016-02-22 LAB — CK
CK TOTAL: 681 U/L — AB (ref 49–397)
Total CK: 547 U/L — ABNORMAL HIGH (ref 49–397)

## 2016-02-22 LAB — PROTIME-INR
INR: 2.33
Prothrombin Time: 26 seconds — ABNORMAL HIGH (ref 11.4–15.2)

## 2016-02-22 LAB — HIV ANTIBODY (ROUTINE TESTING W REFLEX): HIV SCREEN 4TH GENERATION: NONREACTIVE

## 2016-02-22 MED ORDER — CEFEPIME HCL 1 G IJ SOLR
1.0000 g | Freq: Two times a day (BID) | INTRAMUSCULAR | Status: DC
  Administered 2016-02-22 – 2016-02-23 (×2): 1 g via INTRAVENOUS
  Filled 2016-02-22 (×2): qty 1

## 2016-02-22 MED ORDER — HYDROMORPHONE HCL 2 MG/ML IJ SOLN
1.0000 mg | INTRAMUSCULAR | Status: DC | PRN
  Administered 2016-02-22 – 2016-02-24 (×4): 1 mg via INTRAVENOUS
  Filled 2016-02-22 (×4): qty 1

## 2016-02-22 MED ORDER — WARFARIN SODIUM 7.5 MG PO TABS
7.5000 mg | ORAL_TABLET | Freq: Once | ORAL | Status: AC
  Administered 2016-02-22: 7.5 mg via ORAL
  Filled 2016-02-22: qty 1

## 2016-02-22 NOTE — Clinical Social Work Note (Signed)
Clinical Social Work Assessment  Patient Details  Name: Terry Robertson. MRN: [id number] Date of Birth: 1929/02/14  Date of referral:  02/22/16               Reason for consult:  Facility Placement                Permission sought to share information with:  Facility Sport and exercise psychologist, Family Supports Permission granted to share information::     Name::     Terry Robertson  Agency::  SNFs  Relationship::  sons  Contact Information:     Housing/Transportation Living arrangements for the past 2 months:  Single Family Home Source of Information:  Patient, Adult Children Patient Interpreter Needed:  None Criminal Activity/Legal Involvement Pertinent to Current Situation/Hospitalization:  No - Comment as needed Significant Relationships:  Adult Children Lives with:  Adult Children Do you feel safe going back to the place where you live?  No Need for family participation in patient care:  Yes (Comment) (decision making)  Care giving concerns:  Pt son lives with him at home- no care concerns at this time but pt is unsure if son will feel comfortable taking care of him with current level of impairment.   Social Worker assessment / plan:  CSW spoke with patient at bedside concerning PT recommendation for SNF.  Patient states that he is willing to do whatever his son, Terry Robertson, thinks is best.  CSW called listed contact for Terry Robertson and instead got patient's other son Terry Robertson.  Terry Robertson states that they have already discussed plan for patient and believes SNF is in his best interests- request CSW to call back to speak with Terry Robertson about SNF options but he believes preference would be [facility].  Employment status:  Retired Nurse, adult PT Recommendations:  [place] / Referral to community resources:  [place]  Patient/Family's Response to care:  Pt and family agreeable to SNF stay at time of DC.  Patient/Family's  Understanding of and Emotional Response to Diagnosis, Current Treatment, and Prognosis:  Patient does not seem to have much involvement in his own care plan and expresses no concerns or questions- hopeful he can DC from the hospital soon.  Emotional Assessment Appearance:  Appears stated age Attitude/Demeanor/Rapport:    Affect (typically observed):  Appropriate Orientation:  Oriented to Situation, Oriented to Place, Oriented to Self Alcohol / Substance use:  Not Applicable Psych involvement (Current and /or in the community):  No (Comment)  Discharge Needs  Concerns to be addressed:  Care Coordination Readmission within the last 30 days:  Yes Current discharge risk:  Physical Impairment Barriers to Discharge:  Continued Medical Work up   Jorge Ny, LCSW 02/22/2016, 12:00 PM

## 2016-02-22 NOTE — Patient Outreach (Signed)
[facility]) Care Management  02/22/2016  Lorence Dusing. 1928-09-16 [id number]   Telephonic Transition of Care Services   Referral Date:  02/18/2016 Source:  Inpatient Hospital Liaison Issue:  Transition of Care Services.  Please contact son, Rolland Bimler [phone].  H/o multiple co-morbidities .    Outreach call #1 to patient.  Patient not reached at [phone] Additional call to son  [phone] - invalid #.  Additional call to son at demographic # [phone] - no answer.    MR Review:  Appears patient has been readmitted.  Possible SNF at discharge.  H/o Admission:  02/15/2016 - 02/19/2016  CAP (community acquired pneumonia) ED visit 02/21/16 Admission 02/21/16   Nathaneil Canary, BSN, RN, [place] [facility] Care Management Coordinator [phone] Direct [phone] Cell [phone] Office [phone] Fax [email or web address]

## 2016-02-22 NOTE — Evaluation (Signed)
Clinical/Bedside Swallow Evaluation Patient Details  Name: Jaelynn Currier. MRN: [id number] Date of Birth: 1928/08/23  Today's Date: 02/22/2016 Time: SLP Start Time (ACUTE ONLY): 0850 SLP Stop Time (ACUTE ONLY): 0903 SLP Time Calculation (min) (ACUTE ONLY): 13 min  Past Medical History:  Past Medical History:  Diagnosis Date  . Aortic stenosis, mild 08/10/2014   : EF 60-65%, mod Conc LVH, cannot assess Diastolic Fxn.  Mild AS. Mild MVP without MR., Mod LA/RA dilation.  . Asbestosis [facility])    family unclear where he was evaluated in the past  . BPH (benign prostatic hyperplasia)    TURP in 12/2010  . CAD S/P percutaneous coronary angioplasty 1997   status post remote PCI to the LAD and circumflex in 1997, heart cath in 2008 showed widely patent stents in the circumflex and LAD.  RCA had a mid lesion and then was totally occluded distally.  . CHF (congestive heart failure) ([place])   . Complication of anesthesia    " i HAD A SEZIURE "  . Diabetes mellitus   . Essential hypertension   . HCAP (healthcare-associated pneumonia) 02/21/2016  . MGUS (monoclonal gammopathy of unknown significance) 09/21/2014  . Multiple myeloma ([place]) 09/29/2014  . Myelodysplastic syndrome (HCC)    w/mild anemia an neutropenia  . Obesity, morbid ([place])    BMI  . OSA on CPAP   . Paroxysmal atrial fibrillation (HCC)    on coumadin; Diagnosed at the time of his 1st CVA.  CHA2DS2VASC = 6. On  Warfarin  . Seizures ([place])    Post CVA; on Keppra; stable  . ST-segment elevation myocardial infarction (STEMI) of inferior wall ([place]) 1997   inferior MI with left circumflex stent  . Stroke ([place]) 08/2011, 03/2012   L MCA (Afib RVR while admitted for hypotension) - expressive aphasia (almost totally recovered)   Past Surgical History:  Past Surgical History:  Procedure Laterality Date  . CARDIAC CATHETERIZATION  02/08/07   widely patent stent in the circumflex and LAD.  RCA had a mid lesion and then was totally occluded  distally. Cook bare-metal 3.5x20 mm stent  . NM MYOVIEW LTD  01/09/1999   Inferior wall thinning with possible mild ischemia versus infarct. This would correlate well with his known RCA occlusion  . TRANSTHORACIC ECHOCARDIOGRAM  08/06/11; 08/2014   a) EF 55% to 65% with grade2 DD/pseudonormal w/ mildly dilated LA with high LAP/LVEDP; b) EF 60-65%, mod Conc LVH, cannot assess Diastolic Fxn.  Mild AS. Mild MVP without MR., Mod LA/RA dilation.  . TRANSURETHRAL RESECTION OF PROSTATE  12/2010   HPI:  Mazin Emma Jr.is a 82 y.o.malerecently discharged 2 days ago after being admitted for Community acquired pneumonia who has a past medical history of myelodysplastic syndrome, spinal stenosis with neurogenic claudication, history of CAD, seizures and others detailed below. The patient apparently fell down at home and his family found him on the floor early this morning. He says that he does not know how long he had been down on the floor. He is having an exacerbation of his chronic back pain. Pt was noted to have worsening leukocytosis ( he was discharged on prednisone ) and xray findings of slightly worse pulmonary disease concerning for HCAP given his recent admission. Pt has had mulitple BSE with normal findings.    Assessment / Plan / Recommendation Clinical Impression  Pt demonstrates normal swallow function with no signs of aspiration. Pt with multiple comorbidities to explain pna more likely than aspiration given no finding of  dysphagia on multiple observations. Recommend pt continue current diet with basic aspriation precatuions, reviewed with pt. Will sign off.     Aspiration Risk  No limitations    Diet Recommendation Regular;Thin liquid   Liquid Administration via: Cup;Straw Medication Administration: Whole meds with liquid Supervision: Patient able to self feed Compensations: Slow rate;Small sips/bites Postural Changes: Seated upright at 90 degrees    Other  Recommendations Oral Care  Recommendations: Oral care BID   Follow up Recommendations        Frequency and Duration            Prognosis        Swallow Study   General HPI: Vasili Fok Jr.is a 82 y.o.malerecently discharged 2 days ago after being admitted for Community acquired pneumonia who has a past medical history of myelodysplastic syndrome, spinal stenosis with neurogenic claudication, history of CAD, seizures and others detailed below. The patient apparently fell down at home and his family found him on the floor early this morning. He says that he does not know how long he had been down on the floor. He is having an exacerbation of his chronic back pain. Pt was noted to have worsening leukocytosis ( he was discharged on prednisone ) and xray findings of slightly worse pulmonary disease concerning for HCAP given his recent admission. Pt has had mulitple BSE with normal findings.  Type of Study: Bedside Swallow Evaluation Diet Prior to this Study: Regular;Thin liquids Temperature Spikes Noted: No Respiratory Status: Room air History of Recent Intubation: No Behavior/Cognition: Alert Oral Cavity Assessment: Within Functional Limits Oral Care Completed by SLP: No Oral Cavity - Dentition: Missing dentition Vision: Functional for self-feeding Self-Feeding Abilities: Able to feed self Patient Positioning: Partially reclined (SLP repositioned) Baseline Vocal Quality: Normal Volitional Cough: Strong Volitional Swallow: Able to elicit    Oral/Motor/Sensory Function Overall Oral Motor/Sensory Function: Within functional limits   Ice Chips     Thin Liquid Thin Liquid: Within functional limits Presentation: Cup;Straw;Self Fed    Nectar Thick Nectar Thick Liquid: Not tested   Honey Thick Honey Thick Liquid: Not tested   Puree Puree: Not tested   Solid   GO   Solid: Within functional limits       Herbie Baltimore, MA CCC-SLP [phone]  Eric Morganti, Katherene Ponto 02/22/2016,9:52 AM

## 2016-02-22 NOTE — Consult Note (Addendum)
   [facility] CM Inpatient Consult   02/22/2016  Lenden Sirbaugh. [birth year] [id number]    Noted Terry Robertson was readmitted. Spoke with inpatient RNCM who indicates the discharge plan will likely be for SNF this time. Of note, recommendation was for SNF during last hospitalization and family opted to take [name] home instead. Writer obtained consent for [place] [facility] on 02/17/16. He was readmitted before [facility] patient outreach.  Will continue to follow and make appropriate [facility] referral upon discharge.   Marthenia Rolling, MSN-Ed, RN,BSN [facility] Liaison [phone]

## 2016-02-22 NOTE — Evaluation (Signed)
Physical Therapy Evaluation Patient Details Name: Terry Robertson. MRN: [id number] DOB: [birth year] Today's Date: 02/22/2016   History of Present Illness  Terry Robertsonis a 82 y.o.malerecently discharged 2 days ago after being admitted for Community acquired pneumonia who has a past medical history of myelodysplastic syndrome, spinal stenosis with neurogenic claudication, history of CAD, seizures and others detailed below. The patient apparently fell down at home and his Terry Robertson found him on the floor early this morning (12/18).  Clinical Impression  Per chart pt was ambulatory with RW PTA. Pt now requires modA for transfers and is unable to ambulate more than std pvt to chair. Pt to strongly benefit from ST-SNF to all for pt to progress to safe mod I level of function.    Follow Up Recommendations SNF    Equipment Recommendations   (TBD determine by next venue)    Recommendations for Other Services       Precautions / Restrictions Precautions Precautions: Fall Precaution Comments: slow to response      Mobility  Bed Mobility Overal bed mobility: Needs Assistance Bed Mobility: Supine to Sit     Supine to sit: Max assist     General bed mobility comments: max directional v/c's to complete task, assist for LE management of bed and trunk elevation to sit EOB  Transfers Overall transfer level: Needs assistance Equipment used: Rolling walker (2 wheeled) Transfers: Sit to/from [facility] Sit to Stand: Mod assist Stand pivot transfers: Mod assist       General transfer comment: v/c's for safe hand placement, mod A to steady pt during transition of hands from bed to RW. pt wiht R knee instablity during std pvt transfer to chair, max v/c's for safey and sequencing std pvt with RW  Ambulation/Gait             General Gait Details: limted to std pvt to chair  Stairs            Wheelchair Mobility    Modified Rankin (Stroke Patients Only)        Balance Overall balance assessment: Needs assistance Sitting-balance support: Single extremity supported Sitting balance-Leahy Scale: Poor Sitting balance - Comments: required stabilizing self on bed rail   Standing balance support: Bilateral upper extremity supported Standing balance-Leahy Scale: Poor Standing balance comment: needs RW                             Pertinent Vitals/Pain Pain Assessment: 0-10 Pain Score: 10-Worst pain ever Faces Pain Scale: Hurts little more Pain Location: chronic back pain    Home Living Terry Robertson/patient expects to be discharged to:: Private residence Living Arrangements: Children Available Help at Discharge: Terry Robertson;Other (Comment) Type of Home: House Home Access: Stairs to enter Entrance Stairs-Rails: Right;Can reach both;Left Entrance Stairs-Number of Steps: 4 Home Layout: One level Home Equipment: Walker - 2 wheels;Shower seat;Cane - single point      Prior Function Level of Independence: Independent with assistive device(s)         Comments: per chart pt has been ambulating with RW, per pt as of recenlty Terry Robertson has helped him with dressing and bathing and he hasn't been walking much     Hand Dominance   Dominant Hand: Right    Extremity/Trunk Assessment   Upper Extremity Assessment Upper Extremity Assessment: Generalized weakness    Lower Extremity Assessment Lower Extremity Assessment: Generalized weakness    Cervical / Trunk Assessment Cervical / Trunk  Assessment: Other exceptions Cervical / Trunk Exceptions: chronic back pain  Communication   Communication: No difficulties (but slow to respond)  Cognition Arousal/Alertness: Awake/alert Behavior During Therapy: Flat affect Overall Cognitive Status: No Terry Robertson/caregiver present to determine baseline cognitive functioning Area of Impairment: Following commands;Safety/judgement;Problem solving   Current Attention Level: Selective Memory: Decreased  short-term memory;Decreased recall of precautions Following Commands: Follows one step commands with increased time Safety/Judgement: Decreased awareness of safety;Decreased awareness of deficits Awareness: Emergent Problem Solving: Slow processing;Decreased initiation;Difficulty sequencing;Requires verbal cues General Comments: pt requesting for help, v/c's for safety during transfers and use of RW    General Comments General comments (skin integrity, edema, etc.): mild R UE ededma    Exercises     Assessment/Plan    PT Assessment Patient needs continued PT services  PT Problem List Decreased strength;Decreased activity tolerance;Decreased balance;Decreased mobility;Decreased coordination;Decreased cognition;Decreased knowledge of use of DME;Decreased safety awareness;Pain          PT Treatment Interventions DME instruction;Gait training;Stair training;Functional mobility training;Therapeutic activities;Therapeutic exercise;Neuromuscular re-education;Patient/Terry Robertson education;Cognitive remediation    PT Goals (Current goals can be found in the Care Plan section)  Acute Rehab PT Goals Patient Stated Goal: didn't state goal PT Goal Formulation: With patient Time For Goal Achievement:  Potential to Achieve Goals: Good    Frequency Min 3X/week   Barriers to discharge Decreased caregiver support Terry Robertson not available 24/7    Co-evaluation               End of Session Equipment Utilized During Treatment: Gait belt Activity Tolerance: Patient limited by pain Patient left: in chair;with call bell/phone within reach;with bed alarm set Nurse Communication: Mobility status (condom cath came off)         Time: [id number] PT Time Calculation (min) (ACUTE ONLY): 31 min   Charges:   PT Evaluation $PT Eval Moderate Complexity: 1 Procedure PT Treatments $Therapeutic Activity: 8-22 mins   PT G Codes:        Xochilt Conant M Leelan Rajewski 02/22/2016, 10:12 AM   Kittie Plater, PT, DPT Pager #: [phone] Office #: [phone]

## 2016-02-22 NOTE — Care Management Note (Addendum)
Case Management Note  Patient Details  Name: Nessiah Pha. MRN: [id number] Date of Birth: 1928-06-28  Subjective/Objective:                  Patient admitted form home. Ws found down. Released 12-16 to [facility] w/ [facility] for RN PT OT. From home with 2 sonsJames states he is responsible for taking dad to all MD visits. HCPOA is pt's daughter, [name]. Sons states required min. assistance  PTA with ADL'S and used a cane with ambulation, has walker.    [phone] and [name] [phone] (sons)  [name] (Daughter)      [phone]          Action/Plan:  Anticipate DC to SNF, CSW following.  12/20 Notified by Dr Wynetta Emery, [place] stopped. Patient new dx leukemia. DC pending patient/ family notification of dx and dispo plan, potential HH vs Home hospice.   Expected Discharge Date:                  Expected Discharge Plan:  Skilled Nursing Facility  In-House Referral:  Clinical Social Work  Discharge planning Services  CM Consult  Post Acute Care Choice:    Choice offered to:     DME Arranged:    DME Agency:     HH Arranged:    [place] Agency:     Status of Service:  In process, will continue to follow  If discussed at Long Length of Stay Meetings, dates discussed:    Additional Comments:  Carles Collet, RN 02/22/2016, 10:06 AM

## 2016-02-22 NOTE — Progress Notes (Signed)
ANTICOAGULATION CONSULT NOTE  Pharmacy Consult for Coumadin Indication: atrial fibrillation  No Known Allergies  Patient Measurements: Height: 5\' 5"  (165.1 cm) Weight: 244 lb 11.4 oz (111 kg) IBW/kg (Calculated) : 61.5  Vital Signs: Temp: 99.4 F (37.4 C) (12/19 0629) Temp Source: Oral (12/19 0629) BP: 146/71 (12/19 0629) Pulse Rate: 91 (12/19 0629)  Labs:  Recent Labs  02/21/16 1106 02/21/16 1315 02/21/16 1339 02/21/16 1601 02/22/16 0006 02/22/16 0519  HGB 8.4*  --   --  8.6*  --  7.9*  HCT 27.0*  --   --  28.3*  --  26.3*  PLT 93*  --   --  83*  --  80*  LABPROT  --  22.4*  --   --   --  26.0*  INR  --  1.93  --   --   --  2.33  CREATININE 1.16  --   --   --   --  1.00  CKTOTAL  --   --  772*  --  681* 547*    Estimated Creatinine Clearance: 62.1 mL/min (by C-G formula based on SCr of 1 mg/dL).     Assessment: 82 yo M presented to ED 02/22/2016 with pneumonia and was started on IV antibiotics.  Pt also noted to be on Coumadin PTA for hx afib.  INR on admission = 1.93, received 10 mg dose  INR today therapeutic at 2.33  Home Coumadin dose = 7.5mg  PO daily except 5mg  on Sundays.  Last dose 12/17.  Goal of Therapy:  INR 2-3 Monitor platelets by anticoagulation protocol: Yes   Plan:  Coumadin 7.5 mg PO x 1 tonight Daily INR  Thank you Anette Guarneri, PharmD [phone] 02/22/2016 11:07 AM

## 2016-02-22 NOTE — Progress Notes (Signed)
[facility]  Patient Status: Active (receiving services up to time of hospitalization)  [facility] is providing the following services: RN, PT and OT  If patient discharges after hours, please call [phone].   Terry Robertson 02/22/2016, 11:36 AM

## 2016-02-22 NOTE — NC FL2 (Signed)
[place] LEVEL OF CARE SCREENING TOOL     IDENTIFICATION  Patient Name: Terry Robertson. Birthdate: 11/04/1928 Sex: male Admission Date (Current Location): 02/21/2016  [facility] and [place] Number:  Herbalist and Address:  The . [facility], [place] [address], [place], [place] [address]      Provider Number: [id number]  Attending Physician Name and Address:  Murlean Iba, MD  Relative Name and Phone Number:  Rolland Bimler    Current Level of Care: Hospital Recommended Level of Care: [place] Prior Approval Number:    Date Approved/Denied:   PASRR Number:    Discharge Plan: SNF    Current Diagnoses: Patient Active Problem List   Diagnosis Date Noted  . HCAP (healthcare-associated pneumonia) 02/21/2016  . Leukocytosis 02/21/2016  . Fall at home 02/21/2016  . Normocytic anemia 02/15/2016  . Thrombocytopenia ([place]) 11/04/2015  . Poor dentition 07/23/2015  . Multiple myeloma in remission ([place]) 07/22/2015  . Pancytopenia ([place]) 12/29/2014  . Multiple myeloma ([place]) 09/29/2014  . MGUS (monoclonal gammopathy of unknown significance) 09/21/2014  . Left ventricular diastolic dysfunction, NYHA class 2 (grade 2 dysfunction with elevated filling pressures) 10/01/2013  . Hemothorax, left 06/25/2013  . Severe obesity (BMI >= 40) ([place]) 03/09/2013  . PAF (paroxysmal atrial fibrillation) ([place]) 03/09/2013  . Long term current use of anticoagulant therapy 05/21/2012  . Cerebral infarction ([place]) 03/28/2012  . History of likely cardioembolic stroke (04/10/9561) with onset of new diagnosis A. fib; expressive aphasia 08/09/2011  . Diastolic dysfunction: Grade 2 08/07/2011  . Pancytopenia, acquired ([place]) 08/07/2011  . CAD PCI to LAD/CFX in 1997. Cath '08- medical Rx 08/05/2011  . Myelodysplastic syndrome ([place]) 08/05/2011  . Dyslipidemia, goal LDL below 70 08/05/2011  . Essential hypertension 08/05/2011  . S/P TURP Oct 2012  08/05/2011  . DM type 2 (diabetes mellitus, type 2) ([place]) 08/05/2011    Orientation RESPIRATION BLADDER Height & Weight     Self, Time, Situation  O2 (2L [place]) Incontinent, External catheter Weight: 244 lb 11.4 oz (111 kg) Height:  '5\' 5"'$  (165.1 cm)  BEHAVIORAL SYMPTOMS/MOOD NEUROLOGICAL BOWEL NUTRITION STATUS      Continent Diet (see DC summary)  AMBULATORY STATUS COMMUNICATION OF NEEDS Skin   Extensive Assist Verbally Normal                       Personal Care Assistance Level of Assistance  Bathing, Dressing Bathing Assistance: Maximum assistance Feeding assistance: Independent Dressing Assistance: Maximum assistance     Functional Limitations Info             SPECIAL CARE FACTORS FREQUENCY  PT (By licensed PT), OT (By licensed OT)     PT Frequency: 5/wk OT Frequency: 5/wk            Contractures      Additional Factors Info  Code Status, Allergies, Insulin Sliding Scale Code Status Info: FULL Allergies Info: NKA   Insulin Sliding Scale Info: 3/day       Current Medications (02/22/2016):  This is the current hospital active medication list Current Facility-Administered Medications  Medication Dose Route Frequency Provider Last Rate Last Dose  . 0.9 %  sodium chloride infusion  250 mL Intravenous PRN Clanford L Johnson, MD      . 0.9 %  sodium chloride infusion   Intravenous Continuous Clanford Marisa Hua, MD 75 mL/hr at 02/21/16 2105 1,000 mL at 02/21/16 2105  . aspirin EC tablet  81 mg  81 mg Oral Daily Clanford Marisa Hua, MD   81 mg at 02/22/16 0904  . atorvastatin (LIPITOR) tablet 20 mg  20 mg Oral q1800 Clanford L Johnson, MD      . ceFEPIme (MAXIPIME) 1 g in dextrose 5 % 50 mL IVPB  1 g Intravenous Q8H Clanford Marisa Hua, MD   1 g at 02/22/16 0558  . cholecalciferol (VITAMIN D) tablet 2,000 Units  2,000 Units Oral BH-q7a Murlean Iba, MD   2,000 Units at 02/22/16 [phone]  . diltiazem (CARDIZEM CD) 24 hr capsule 180 mg  180 mg Oral Daily Clanford Marisa Hua, MD   180 mg at 02/22/16 0904  . feeding supplement (ENSURE ENLIVE) (ENSURE ENLIVE) liquid 237 mL  237 mL Oral BID BM Clanford L Johnson, MD      . ferrous sulfate tablet 325 mg  325 mg Oral Q breakfast Clanford Marisa Hua, MD   325 mg at 02/22/16 0904  . furosemide (LASIX) tablet 20 mg  20 mg Oral BH-q7a Clanford Marisa Hua, MD   20 mg at 02/22/16 0904  . gabapentin (NEURONTIN) capsule 100 mg  100 mg Oral QHS Clanford Marisa Hua, MD   100 mg at 02/21/16 2143  . HYDROmorphone (DILAUDID) injection 0.5 mg  0.5 mg Intravenous Once Clanford L Johnson, MD      . HYDROmorphone (DILAUDID) injection 1 mg  1 mg Intravenous Q3H PRN Clanford L Johnson, MD      . insulin aspart (novoLOG) injection 0-15 Units  0-15 Units Subcutaneous TID WC Clanford L Johnson, MD      . latanoprost (XALATAN) 0.005 % ophthalmic solution 1 drop  1 drop Both Eyes QHS Clanford Marisa Hua, MD   1 drop at 02/21/16 2144  . levETIRAcetam (KEPPRA) tablet 500 mg  500 mg Oral Q12H Clanford Marisa Hua, MD   500 mg at 02/22/16 0904  . methylPREDNISolone sodium succinate (SOLU-MEDROL) 40 mg/mL injection 40 mg  40 mg Intravenous Q24H Clanford Marisa Hua, MD   40 mg at 02/21/16 2136  . metoprolol tartrate (LOPRESSOR) tablet 25 mg  25 mg Oral QPM Clanford Marisa Hua, MD   25 mg at 02/21/16 2152  . omega-3 acid ethyl esters (LOVAZA) capsule 2 g  2 g Oral BID Clanford Marisa Hua, MD   2 g at 02/22/16 0904  . polyethylene glycol (MIRALAX / GLYCOLAX) packet 17 g  17 g Oral Daily Clanford Marisa Hua, MD   17 g at 02/22/16 0905  . sodium chloride flush (NS) 0.9 % injection 3 mL  3 mL Intravenous Q12H Clanford L Johnson, MD      . sodium chloride flush (NS) 0.9 % injection 3 mL  3 mL Intravenous PRN Clanford L Johnson, MD      . vancomycin (VANCOCIN) 1,250 mg in sodium chloride 0.9 % 250 mL IVPB  1,250 mg Intravenous Q24H Rebecka Apley, [facility]      . vitamin C (ASCORBIC ACID) tablet 1,000 mg  1,000 mg Oral Daily Clanford Marisa Hua, MD   1,000 mg at 02/22/16  0904  . warfarin (COUMADIN) tablet 7.5 mg  7.5 mg Oral ONCE-1800 Clanford Marisa Hua, MD      . Warfarin - Pharmacist Dosing Inpatient   Does not apply [place], [facility]         Discharge Medications: Please see discharge summary for a list of discharge medications.  Relevant Imaging Results:  Relevant Lab Results:   Additional Information SS#:  [id number]  Jorge Ny, LCSW

## 2016-02-22 NOTE — Progress Notes (Signed)
PROGRESS NOTE    Terry Robertson.  ZDG:[id number]  DOB: [birth year]  DOA: 02/21/2016 PCP: [name], MD Outpatient Specialists:   Hospital course: Presented as readmit after falling down at home on floor for unknown length of time, found to be dehydrated, possibly with HCAP, severe back pain, negative plain films, has chronic back pain, likely will need SNF.   Assessment & Plan:   1. Fall at home - likely multifactorial given his advanced age and many chronic medical co-morbidities.  He was down for unknown period of time and now has elevated CK levels.  He is being treated with IV fluid hydration, repeat CK levels show improvement.  PT/OT consult requested.   No acute findings on lumbar xrays.  CT head negative for acute findings. 2. Dehydration - treating with IV fluids as above. Should be completed 12/19.  3. MDS - chronic - followed by heme/onc Dr. Alvy Bimler.  He will follow up with them outpatient.  4. Grade 2 diastolic dysfunction - currently compensated.   5. Afib with chronic anticoagulation with warfarin - asked for pharmacist to assist with managing warfarin in hospital. 6. Essential hypertension - resume home medications and follow.  7. Cerebrovascular Disease - stable, follow, continue measures for secondary prevention.  8. Thrombocytopenia - chronic, follow, no acute changes from recent testing.  9.  Leukocytosis - likely secondary to being on steroids, possibly due to lung infection 10. HCAP - empirically treating for HCAP given recent admission, see orders and antibiotic protocol.  Pt doesn't have any symptoms of cough, SOB or fever.  Plan to de-escalate antibiotics 12/20.   11. DM type 2 - diet controlled, will follow in hospital and treat as needed.  A1c pending.  12. CAD - stable, compensated, no chest pain, normal troponin I.  EKG: no acute findings.     DVT Prophylaxis: warfarin/ted hoses Code Status: full   Family Communication: bedside  Disposition Plan: SNF  requested   Subjective: Pt reports that his back is hurting a lot.  PT is coming to do an assessment.   Objective: Vitals:   02/21/16 1830 02/21/16 2100 02/21/16 2140 02/22/16 0629  BP: 163/81  (!) 158/62 (!) 146/71  Pulse: 89  98 91  Resp: _0 Temp:   99.8 F (37.7 C) 99.4 F (37.4 C)  TempSrc:   Oral Oral  SpO2: 98%  98% 98%  Weight:   111 kg (244 lb 11.4 oz)   Height:  _1  (1.651 m) _2  (1.651 m)     Intake/Output Summary (Last 24 hours) at 02/22/16 1126 Last data filed at 02/22/16 0919  Gross per 24 hour  Intake              170 ml  Output              600 ml  Net             -430 ml   Filed Weights   02/21/16 2140  Weight: 111 kg (244 lb 11.4 oz)    Exam:  Constitutional: He appears well-developedand well-nourished.  Patient makes poor eye contact. HENT: Head: Normocephalicand atraumatic.  Eyes: EOMare normal. Pupils are equal, round, and reactive to light.  Neck: No JVDpresent.  Cardiovascular: Normal rate, regular rhythmand intact distal pulses. Murmurheard. Loud systolic murmur heard best over the left second ICS. Pulmonary/Chest: Effort normaland breath sounds normal.  Abdominal: Soft.  Neurological: He is alert. Alert, oriented x3. Cranial nerves appear intact,  sensation is hyperesthetic in the right leg but normal elsewhere. No weakness to plantar or dorsi flexion. No weakness in the upper extremities. No overt facial droop. Gait is deferred. Skin: Skin is warmand dry.   Data Reviewed: Basic Metabolic Panel:  Recent Labs Lab 02/17/16 0328 02/18/16 0403 02/19/16 0332 02/21/16 1106 02/22/16 0519  NA 138 139 142 138 139  K 4.0 4.1 3.6 3.5 3.6  CL 105 106 108 106 108  CO2 _0 GLUCOSE 205* 162* 111* 104* 80  BUN 16 29* 23* 10 7  CREATININE 1.32* 1.40* 1.18 1.16 1.00  CALCIUM 8.0* 8.2* 8.2* 8.1* 7.6*   Liver Function Tests:  Recent Labs Lab 02/15/16 1744 02/21/16 1315 02/22/16 0519  AST 35 94* 69*  ALT 25  110* 82*  ALKPHOS 44 46 44  BILITOT 0.7 0.8 0.9  PROT 8.9* 8.2* 7.4  ALBUMIN 2.8* 2.4* 2.1*   No results for input(s): LIPASE, AMYLASE in the last 168 hours.  Recent Labs Lab 02/21/16 1317  AMMONIA 19   CBC:  Recent Labs Lab 02/15/16 1744  02/18/16 0403 02/19/16 0332 02/21/16 1106 02/21/16 1601 02/22/16 0519  WBC 5.5  < > 12.7* 14.7* 23.2* 21.6* 24.9*  NEUTROABS 0.8*  --   --   --   --  4.8 9.5*  HGB 9.9*  < > 8.3* 8.2* 8.4* 8.6* 7.9*  HCT 32.5*  < > 27.2* 27.2* 27.0* 28.3* 26.3*  MCV 91.5  < > 88.9 91.6 88.2 89.0 91.3  PLT 41*  < > 55* 67* 93* 83* 80*  < > = values in this interval not displayed. Cardiac Enzymes:  Recent Labs Lab 02/21/16 1339 02/22/16 0006 02/22/16 0519  CKTOTAL 772* 681* 547*   CBG (last 3)   Recent Labs  02/21/16 1354 02/21/16 2144 02/22/16 0749  GLUCAP 91 90 79   Recent Results (from the past 240 hour(s))  Blood Culture (routine x 2)     Status: None   Collection Time: 02/15/16  6:10 PM  Result Value Ref Range Status   Specimen Description BLOOD LEFT ANTECUBITAL  Final   Special Requests BOTTLES DRAWN AEROBIC AND ANAEROBIC 5CC  Final   Culture NO GROWTH 5 DAYS  Final   Report Status 02/20/2016 FINAL  Final  Blood Culture (routine x 2)     Status: None   Collection Time: 02/15/16  6:30 PM  Result Value Ref Range Status   Specimen Description BLOOD LEFT HAND  Final   Special Requests IN PEDIATRIC BOTTLE 4CC  Final   Culture NO GROWTH 5 DAYS  Final   Report Status 02/20/2016 FINAL  Final  Urine culture     Status: None   Collection Time: 02/15/16  6:53 PM  Result Value Ref Range Status   Specimen Description URINE, CLEAN CATCH  Final   Special Requests NONE  Final   Culture NO GROWTH  Final   Report Status 02/17/2016 FINAL  Final     Studies: Dg Chest 2 View  Result Date: 02/21/2016 CLINICAL DATA:  Fall with weakness. EXAM: CHEST  2 VIEW COMPARISON:  02/15/2016 FINDINGS: Two views study limited by positioning. Volumes are  low. The cardio pericardial silhouette is enlarged. Left base collapse/ consolidation again noted with potential small left pleural effusion. New nodular density in the right mid lung may be confluence of shadows or reflect pneumonia. Pulmonary vascular congestion noted. The visualized bony structures of the thorax are intact. IMPRESSION: Cardiomegaly with vascular congestion and  persistent retrocardiac collapse/ consolidation. New nodular density right mid lung may represent confluence of shadows or infectious/inflammatory process. Electronically Signed   By: Misty Stanley M.D.   On: 02/21/2016 15:09   Dg Lumbar Spine Complete  Result Date: 02/21/2016 CLINICAL DATA:  Fall, back pain EXAM: LUMBAR SPINE - COMPLETE 4+ VIEW COMPARISON:  10/27/2015 FINDINGS: Normal alignment of the vertebral bodies. There is endplate sclerosis and osteophytosis. There is joint space narrowing at L4-L5 and L5-S1. No subluxation. Atherosclerotic calcification of the aorta. IMPRESSION: 1. No acute findings lumbar spine.  No change from prior. 2. Multilevel disc osteophytic disease. Electronically Signed   By: Suzy Bouchard M.D.   On: 02/21/2016 15:03   Ct Head Wo Contrast  Result Date: 02/21/2016 CLINICAL DATA:  Fall; pt found on floor this AM by family, increased confusion, generalized weakness EXAM: CT HEAD WITHOUT CONTRAST TECHNIQUE: Contiguous axial images were obtained from the base of the skull through the vertex without intravenous contrast. COMPARISON:  07/15/2013 FINDINGS: Brain: Old left parietal infarct and encephalomalacia as before. Lacunar infarct involving anterior limb right internal capsule as before. Diffuse parenchymal atrophy. Patchy areas of hypoattenuation in deep and periventricular white matter bilaterally. Negative for acute intracranial hemorrhage, mass lesion, acute infarction, midline shift, or mass-effect. Acute infarct may be inapparent on noncontrast CT. Ventricles and sulci symmetric. Vascular:  Atherosclerotic and physiologic intracranial calcifications. Skull: Bone windows demonstrate no focal lesion. Sinuses/Orbits: Mucoperiosteal thickening in bilateral maxillary sinuses. Orbits unremarkable. Other: None. IMPRESSION: 1. Negative for bleed or other acute intracranial process. 2. Remote infarcts, nonspecific white matter changes, and parenchymal atrophy as before. 3. Bilateral maxillary sinus disease. Electronically Signed   By: Lucrezia Europe M.D.   On: 02/21/2016 15:10   Scheduled Meds: . aspirin EC  81 mg Oral Daily  . atorvastatin  20 mg Oral q1800  . ceFEPime (MAXIPIME) IV  1 g Intravenous Q8H  . cholecalciferol  2,000 Units Oral BH-q7a  . diltiazem  180 mg Oral Daily  . feeding supplement (ENSURE ENLIVE)  237 mL Oral BID BM  . ferrous sulfate  325 mg Oral Q breakfast  . furosemide  20 mg Oral BH-q7a  . gabapentin  100 mg Oral QHS  .  HYDROmorphone (DILAUDID) injection  0.5 mg Intravenous Once  . insulin aspart  0-15 Units Subcutaneous TID WC  . latanoprost  1 drop Both Eyes QHS  . levETIRAcetam  500 mg Oral Q12H  . methylPREDNISolone (SOLU-MEDROL) injection  40 mg Intravenous Q24H  . metoprolol tartrate  25 mg Oral QPM  . omega-3 acid ethyl esters  2 g Oral BID  . polyethylene glycol  17 g Oral Daily  . sodium chloride flush  3 mL Intravenous Q12H  . vancomycin  1,250 mg Intravenous Q24H  . vitamin C  1,000 mg Oral Daily  . warfarin  7.5 mg Oral ONCE-1800  . Warfarin - Pharmacist Dosing Inpatient   Does not apply q1800   Continuous Infusions: . sodium chloride 1,000 mL (02/21/16 2105)    Principal Problem:   HCAP (healthcare-associated pneumonia) Active Problems:   CAD PCI to LAD/CFX in 1997. Cath '08- medical Rx   Myelodysplastic syndrome ([place])   Essential hypertension   DM type 2 (diabetes mellitus, type 2) ([facility])   Diastolic dysfunction: Grade 2   Cerebral infarction ([place])   Long term current use of anticoagulant therapy   Left ventricular diastolic dysfunction,  NYHA class 2 (grade 2 dysfunction with elevated filling pressures)   MGUS (monoclonal gammopathy  of unknown significance)   Multiple myeloma in remission ([place])   Thrombocytopenia ([place])   Leukocytosis   Fall at home  Time spent:   Irwin Brakeman, MD, FAAFP [facility] Pager [phone]  If 7PM-7AM, please contact night-coverage [email or web address] Password [email or web address] 02/22/2016, 11:26 AM    LOS: 1 day

## 2016-02-23 ENCOUNTER — Other Ambulatory Visit: Payer: Self-pay

## 2016-02-23 DIAGNOSIS — I4891 Unspecified atrial fibrillation: Secondary | ICD-10-CM

## 2016-02-23 DIAGNOSIS — D649 Anemia, unspecified: Secondary | ICD-10-CM

## 2016-02-23 DIAGNOSIS — Z7901 Long term (current) use of anticoagulants: Secondary | ICD-10-CM

## 2016-02-23 DIAGNOSIS — C9000 Multiple myeloma not having achieved remission: Secondary | ICD-10-CM

## 2016-02-23 LAB — COMPREHENSIVE METABOLIC PANEL
ALT: 72 U/L — AB (ref 17–63)
ANION GAP: 7 (ref 5–15)
AST: 60 U/L — ABNORMAL HIGH (ref 15–41)
Albumin: 1.9 g/dL — ABNORMAL LOW (ref 3.5–5.0)
Alkaline Phosphatase: 45 U/L (ref 38–126)
BUN: 8 mg/dL (ref 6–20)
CHLORIDE: 106 mmol/L (ref 101–111)
CO2: 24 mmol/L (ref 22–32)
Calcium: 7.7 mg/dL — ABNORMAL LOW (ref 8.9–10.3)
Creatinine, Ser: 1.12 mg/dL (ref 0.61–1.24)
GFR calc non Af Amer: 58 mL/min — ABNORMAL LOW (ref >60)
Glucose, Bld: 178 mg/dL — ABNORMAL HIGH (ref 65–99)
Potassium: 4.3 mmol/L (ref 3.5–5.1)
SODIUM: 137 mmol/L (ref 135–145)
Total Bilirubin: 0.6 mg/dL (ref 0.3–1.2)
Total Protein: 7.4 g/dL (ref 6.5–8.1)

## 2016-02-23 LAB — CBC WITH DIFFERENTIAL/PLATELET
BASOS PCT: 0 %
Band Neutrophils: 0 %
Basophils Absolute: 0 10*3/uL (ref 0.0–0.1)
Blasts: 0 %
EOS PCT: 0 %
Eosinophils Absolute: 0 10*3/uL (ref 0.0–0.7)
HCT: 26.4 % — ABNORMAL LOW (ref 39.0–52.0)
HEMOGLOBIN: 7.8 g/dL — AB (ref 13.0–17.0)
LYMPHS ABS: 10.5 10*3/uL — AB (ref 0.7–4.0)
LYMPHS PCT: 42 %
MCH: 26.4 pg (ref 26.0–34.0)
MCHC: 29.5 g/dL — AB (ref 30.0–36.0)
MCV: 89.5 fL (ref 78.0–100.0)
MONO ABS: 2 10*3/uL — AB (ref 0.1–1.0)
Metamyelocytes Relative: 0 %
Monocytes Relative: 8 %
Myelocytes: 0 %
NEUTROS PCT: 50 %
NRBC: 1 /100{WBCs} — AB
Neutro Abs: 12.6 10*3/uL — ABNORMAL HIGH (ref 1.7–7.7)
OTHER: 0 %
PLATELETS: 92 10*3/uL — AB (ref 150–400)
Promyelocytes Absolute: 0 %
RBC: 2.95 MIL/uL — ABNORMAL LOW (ref 4.22–5.81)
RDW: 19.3 % — ABNORMAL HIGH (ref 11.5–15.5)
WBC: 25.1 10*3/uL — ABNORMAL HIGH (ref 4.0–10.5)

## 2016-02-23 LAB — GLUCOSE, CAPILLARY
GLUCOSE-CAPILLARY: 176 mg/dL — AB (ref 65–99)
GLUCOSE-CAPILLARY: 203 mg/dL — AB (ref 65–99)
GLUCOSE-CAPILLARY: 200 mg/dL — AB (ref 65–99)
GLUCOSE-CAPILLARY: 175 mg/dL — AB (ref 65–99)

## 2016-02-23 LAB — PROTIME-INR
INR: 2.36
PROTHROMBIN TIME: 26.2 s — AB (ref 11.4–15.2)

## 2016-02-23 LAB — PATHOLOGIST SMEAR REVIEW

## 2016-02-23 LAB — CK: Total CK: 258 U/L (ref 49–397)

## 2016-02-23 LAB — LEGIONELLA PNEUMOPHILA SEROGP 1 UR AG: L. PNEUMOPHILA SEROGP 1 UR AG: NEGATIVE

## 2016-02-23 MED ORDER — VANCOMYCIN HCL 10 G IV SOLR
1250.0000 mg | Freq: Once | INTRAVENOUS | Status: DC
  Filled 2016-02-23: qty 1250

## 2016-02-23 MED ORDER — DOXYCYCLINE HYCLATE 100 MG PO TABS: 100.0000 mg | ORAL_TABLET | Freq: Two times a day (BID) | ORAL | Status: DC

## 2016-02-23 MED ORDER — DOXYCYCLINE HYCLATE 100 MG PO TABS
100.0000 mg | ORAL_TABLET | Freq: Two times a day (BID) | ORAL | Status: DC
  Administered 2016-02-23 (×2): 100 mg via ORAL
  Filled 2016-02-23 (×2): qty 1

## 2016-02-23 MED ORDER — HYDROCODONE-ACETAMINOPHEN 5-325 MG PO TABS: 1.0000 | ORAL_TABLET | Freq: Four times a day (QID) | ORAL | 0 refills | Status: DC | PRN

## 2016-02-23 MED ORDER — WARFARIN SODIUM 7.5 MG PO TABS
7.5000 mg | ORAL_TABLET | Freq: Once | ORAL | Status: AC
  Administered 2016-02-23: 7.5 mg via ORAL
  Filled 2016-02-23: qty 1

## 2016-02-23 NOTE — Progress Notes (Signed)
OT Cancellation Note  Patient Details Name: Terry Robertson. MRN: [id number] DOB: 1928/03/21   Cancelled Treatment:    Reason Eval/Treat Not Completed: Other (comment) (plan for d/c to SNF today; will defer OT to next venue). Please re-consult if needs change or OT eval needed for insurance approval. Thank you for this referral.  Binnie Kand M.S., OTR/L Pager: [phone]  02/23/2016, 12:11 PM

## 2016-02-23 NOTE — Consult Note (Signed)
[place] NOTE  Patient Care Team: [name], MD as PCP - General (Internal Medicine) Heath Lark, MD as Consulting Physician (Hematology and Oncology) Standley Brooking, RN as [place], LCSW as [place] (Licensed Clinical Social Worker)  CHIEF COMPLAINTS/PURPOSE OF CONSULTATION:  Acute leukemia  HISTORY OF PRESENTING ILLNESS:  Terry Robertson. 82 y.o. male is seen in the hospital due to newly diagnosed acute leukemia. This patient is well-known to me. He has concurrent diagnosis of myeloma and MDS. He is receiving supportive treatment with prednisone therapy only in the outpatient clinic. Summary of his oncology history as follows:   Myelodysplastic syndrome ([place])   08/05/2011 Initial Diagnosis    Myelodysplastic syndrome      09/21/2014 Bone Marrow Biopsy    Accession: [id number] showed myelofibrosis and 15% plasma cells      10/15/2014 - 07/23/2015 Chemotherapy    He started taking Revlimid weekly along with dexamethasone weekly      The patient missed his recent appointment in the outpatient clinic due to recurrent hospitalization for pneumonia. He had altered mental status with significant leukocytosis and abnormal chest x-ray, and was placed on antibiotic therapy without improvement. He complained of weakness. He was admitted to the hospital 2 days ago after presentation with a fall with back pain.  On admission, his CBC showed worsening leukocytosis. In October 2017, his white blood cell count is at 3.3. Since recent admission, his white count cup progressively worse. On 02/21/2016, he has elevated white blood cell count of 23.2 with almost 50% blasts on peripheral blood. The peripheral blood smear was reviewed by the pathologist who confirmed that he has probable diagnosis of acute leukemia  Since admission, with adequate IV hydration, he is improving. Today, he feels  well. He denies pain. Denies worsening shortness of breath or cough.  He is also noted to have progressive anemia. Platelet count is adequate The patient denies any recent signs or symptoms of bleeding such as spontaneous epistaxis, hematuria or hematochezia. He denies anorexia or with loss of appetite. No recent bowel habit changes  MEDICAL HISTORY:  Past Medical History:  Diagnosis Date  . Aortic stenosis, mild 08/10/2014   : EF 60-65%, mod Conc LVH, cannot assess Diastolic Fxn.  Mild AS. Mild MVP without MR., Mod LA/RA dilation.  . Asbestosis [facility])    family unclear where he was evaluated in the past  . BPH (benign prostatic hyperplasia)    TURP in 12/2010  . CAD S/P percutaneous coronary angioplasty 1997   status post remote PCI to the LAD and circumflex in 1997, heart cath in 2008 showed widely patent stents in the circumflex and LAD.  RCA had a mid lesion and then was totally occluded distally.  . CHF (congestive heart failure) ([place])   . Complication of anesthesia    " i HAD A SEZIURE "  . Diabetes mellitus   . Essential hypertension   . HCAP (healthcare-associated pneumonia) 02/21/2016  . MGUS (monoclonal gammopathy of unknown significance) 09/21/2014  . Multiple myeloma ([place]) 09/29/2014  . Myelodysplastic syndrome (HCC)    w/mild anemia an neutropenia  . Obesity, morbid ([place])    BMI  . OSA on CPAP   . Paroxysmal atrial fibrillation (HCC)    on coumadin; Diagnosed at the time of his 1st CVA.  CHA2DS2VASC = 6. On  Warfarin  . Seizures ([place])    Post CVA; on Keppra; stable  .  ST-segment elevation myocardial infarction (STEMI) of inferior wall ([place]) 1997   inferior MI with left circumflex stent  . Stroke ([place]) 08/2011, 03/2012   L MCA (Afib RVR while admitted for hypotension) - expressive aphasia (almost totally recovered)    SURGICAL HISTORY: Past Surgical History:  Procedure Laterality Date  . CARDIAC CATHETERIZATION  02/08/07   widely patent stent in the circumflex and LAD.   RCA had a mid lesion and then was totally occluded distally. Cook bare-metal 3.5x20 mm stent  . NM MYOVIEW LTD  01/09/1999   Inferior wall thinning with possible mild ischemia versus infarct. This would correlate well with his known RCA occlusion  . TRANSTHORACIC ECHOCARDIOGRAM  08/06/11; 08/2014   a) EF 55% to 65% with grade2 DD/pseudonormal w/ mildly dilated LA with high LAP/LVEDP; b) EF 60-65%, mod Conc LVH, cannot assess Diastolic Fxn.  Mild AS. Mild MVP without MR., Mod LA/RA dilation.  . TRANSURETHRAL RESECTION OF PROSTATE  12/2010    SOCIAL HISTORY: Social History   Social History  . Marital status: Widowed    Spouse name: N/A  . Number of children: 33  . Years of education: 11   Occupational History  . Not on file.   Social History Main Topics  . Smoking status: Former Smoker    Packs/day: 1.00    Years: 10.00    Types: Cigarettes    Quit date: 03/08/1988  . Smokeless tobacco: Never Used     Comment: smoked cigars and cigarettes  . Alcohol use No  . Drug use: No  . Sexual activity: Not on file   Other Topics Concern  . Not on file   Social History Narrative   Pt is widowed father of 7, 24 grandchildren, 1 great-grandchildren and 3 great-great-grandchildren.  He does not get routine exercise.   Patient has a high school education.   Patient is right-handed.   Patient drinks one cup of coffee daily.   Living with 2 sons.      FAMILY HISTORY: Family History  Problem Relation Age of Onset  . Heart disease Mother   . Heart attack Father 36    ALLERGIES:  has No Known Allergies.  MEDICATIONS:  Current Facility-Administered Medications  Medication Dose Route Frequency Provider Last Rate Last Dose  . 0.9 %  sodium chloride infusion  250 mL Intravenous PRN Clanford Marisa Hua, MD      . aspirin EC tablet 81 mg  81 mg Oral Daily Clanford L Johnson, MD   81 mg at 02/23/16 0830  . atorvastatin (LIPITOR) tablet 20 mg  20 mg Oral q1800 Clanford Marisa Hua, MD   20 mg at  02/22/16 1705  . cholecalciferol (VITAMIN D) tablet 2,000 Units  2,000 Units Oral BH-q7a Murlean Iba, MD   2,000 Units at 02/23/16 [phone]  . diltiazem (CARDIZEM CD) 24 hr capsule 180 mg  180 mg Oral Daily Clanford Marisa Hua, MD   180 mg at 02/23/16 0829  . doxycycline (VIBRA-TABS) tablet 100 mg  100 mg Oral Q12H Clanford Marisa Hua, MD   100 mg at 02/23/16 1227  . feeding supplement (ENSURE ENLIVE) (ENSURE ENLIVE) liquid 237 mL  237 mL Oral BID BM Clanford Marisa Hua, MD   Stopped at 02/23/16 1400  . ferrous sulfate tablet 325 mg  325 mg Oral Q breakfast Clanford Marisa Hua, MD   325 mg at 02/23/16 5625  . furosemide (LASIX) tablet 20 mg  20 mg Oral BH-q7a Clanford Marisa Hua, MD  20 mg at 02/23/16 0832  . gabapentin (NEURONTIN) capsule 100 mg  100 mg Oral QHS Clanford Marisa Hua, MD   100 mg at 02/22/16 2332  . HYDROmorphone (DILAUDID) injection 0.5 mg  0.5 mg Intravenous Once Clanford L Johnson, MD      . HYDROmorphone (DILAUDID) injection 1 mg  1 mg Intravenous Q3H PRN Clanford Marisa Hua, MD   1 mg at 02/22/16 2333  . insulin aspart (novoLOG) injection 0-15 Units  0-15 Units Subcutaneous TID WC Clanford Marisa Hua, MD   5 Units at 02/23/16 1222  . latanoprost (XALATAN) 0.005 % ophthalmic solution 1 drop  1 drop Both Eyes QHS Clanford Marisa Hua, MD   1 drop at 02/22/16 2334  . levETIRAcetam (KEPPRA) tablet 500 mg  500 mg Oral Q12H Clanford Marisa Hua, MD   500 mg at 02/23/16 0831  . metoprolol tartrate (LOPRESSOR) tablet 25 mg  25 mg Oral QPM Clanford Marisa Hua, MD   25 mg at 02/22/16 1705  . omega-3 acid ethyl esters (LOVAZA) capsule 2 g  2 g Oral BID Clanford Marisa Hua, MD   2 g at 02/23/16 0831  . polyethylene glycol (MIRALAX / GLYCOLAX) packet 17 g  17 g Oral Daily Clanford Marisa Hua, MD   17 g at 02/23/16 0831  . sodium chloride flush (NS) 0.9 % injection 3 mL  3 mL Intravenous Q12H Clanford L Johnson, MD   3 mL at 02/23/16 1000  . sodium chloride flush (NS) 0.9 % injection 3 mL  3 mL  Intravenous PRN Clanford L Johnson, MD      . vitamin C (ASCORBIC ACID) tablet 1,000 mg  1,000 mg Oral Daily Clanford Marisa Hua, MD   1,000 mg at 02/23/16 0831  . warfarin (COUMADIN) tablet 7.5 mg  7.5 mg Oral ONCE-1800 Clanford Marisa Hua, MD      . Warfarin - Pharmacist Dosing Inpatient   Does not apply q1800 Theone Murdoch Hammons, RPH        REVIEW OF SYSTEMS:   Constitutional: Denies fevers, chills or abnormal night sweats Eyes: Denies blurriness of vision, double vision or watery eyes Ears, nose, mouth, throat, and face: Denies mucositis or sore throat Respiratory: Denies cough, dyspnea or wheezes Cardiovascular: Denies palpitation, chest discomfort or lower extremity swelling Gastrointestinal:  Denies nausea, heartburn or change in bowel habits Skin: Denies abnormal skin rashes Lymphatics: Denies new lymphadenopathy or easy bruising Behavioral/Psych: Mood is stable, no new changes  All other systems were reviewed with the patient and are negative.  PHYSICAL EXAMINATION: ECOG PERFORMANCE STATUS: 2 - Symptomatic, <50% confined to bed  Vitals:   02/23/16 0829 02/23/16 1343  BP: 137/65 (!) 151/69  Pulse:  83  Resp:  18  Temp:  98.1 F (36.7 C)   Filed Weights   02/21/16 2140  Weight: 244 lb 11.4 oz (111 kg)    GENERAL:alert, no distress and comfortable. He is moderately obese  SKIN: skin color, texture, turgor are normal, no rashes or significant lesions EYES: normal, conjunctiva are pink and non-injected, sclera clear OROPHARYNX:no exudate, no erythema and lips, buccal mucosa, and tongue normal  NECK: supple, thyroid normal size, non-tender, without nodularity LYMPH:  no palpable lymphadenopathy in the cervical, axillary or inguinal LUNGS: clear to auscultation and percussion with normal breathing effort HEART: regular rate & rhythm and no murmurs and no lower extremity edema ABDOMEN:abdomen soft, non-tender and normal bowel sounds Musculoskeletal:no cyanosis of digits and  no clubbing  PSYCH: alert & oriented x  3 with fluent speech NEURO: no focal motor/sensory deficits  LABORATORY DATA:  I have reviewed the data as listed Lab Results  Component Value Date   WBC 25.1 (H) 02/23/2016   HGB 7.8 (L) 02/23/2016   HCT 26.4 (L) 02/23/2016   MCV 89.5 02/23/2016   PLT 92 (L) 02/23/2016    Recent Labs  02/21/16 1106 02/21/16 1315 02/22/16 0519 02/23/16 0612  NA 138  --  139 137  K 3.5  --  3.6 4.3  CL 106  --  108 106  CO2 22  --  22 24  GLUCOSE 104*  --  80 178*  BUN 10  --  7 8  CREATININE 1.16  --  1.00 1.12  CALCIUM 8.1*  --  7.6* 7.7*  GFRNONAA 56*  --  >60 58*  GFRAA >60  --  >60 >60  PROT  --  8.2* 7.4 7.4  ALBUMIN  --  2.4* 2.1* 1.9*  AST  --  94* 69* 60*  ALT  --  110* 82* 72*  ALKPHOS  --  46 44 45  BILITOT  --  0.8 0.9 0.6  BILIDIR  --  0.1  --   --   IBILI  --  0.7  --   --     RADIOGRAPHIC STUDIES: I have personally reviewed the radiological images as listed and agreed with the findings in the report. Dg Chest 2 View  Result Date: 02/21/2016 CLINICAL DATA:  Fall with weakness. EXAM: CHEST  2 VIEW COMPARISON:  02/15/2016 FINDINGS: Two views study limited by positioning. Volumes are low. The cardio pericardial silhouette is enlarged. Left base collapse/ consolidation again noted with potential small left pleural effusion. New nodular density in the right mid lung may be confluence of shadows or reflect pneumonia. Pulmonary vascular congestion noted. The visualized bony structures of the thorax are intact. IMPRESSION: Cardiomegaly with vascular congestion and persistent retrocardiac collapse/ consolidation. New nodular density right mid lung may represent confluence of shadows or infectious/inflammatory process. Electronically Signed   By: Misty Stanley M.D.   On: 02/21/2016 15:09   Dg Chest 2 View  Result Date: 02/15/2016 CLINICAL DATA:  Initial evaluation for acute fever. Evaluate for pneumonia. EXAM: CHEST  2 VIEW COMPARISON:   Prior radiograph from 01/16/2016. FINDINGS: Stable cardiomegaly.  Mediastinal silhouette within normal limits. Lungs mildly hypoinflated. A left-sided pleural effusion is similar to previous. Subtly increased left basilar opacity, which may reflect atelectasis or infiltrate. No other focal airspace disease. No pneumothorax. No acute osseous abnormality. IMPRESSION: Persistent left pleural effusion with slightly increased left basilar opacity, which may reflect atelectasis or infiltrate. Electronically Signed   By: Jeannine Boga M.D.   On: 02/15/2016 18:07   Dg Lumbar Spine Complete  Result Date: 02/21/2016 CLINICAL DATA:  Fall, back pain EXAM: LUMBAR SPINE - COMPLETE 4+ VIEW COMPARISON:  10/27/2015 FINDINGS: Normal alignment of the vertebral bodies. There is endplate sclerosis and osteophytosis. There is joint space narrowing at L4-L5 and L5-S1. No subluxation. Atherosclerotic calcification of the aorta. IMPRESSION: 1. No acute findings lumbar spine.  No change from prior. 2. Multilevel disc osteophytic disease. Electronically Signed   By: Suzy Bouchard M.D.   On: 02/21/2016 15:03   Ct Head Wo Contrast  Result Date: 02/21/2016 CLINICAL DATA:  Fall; pt found on floor this AM by family, increased confusion, generalized weakness EXAM: CT HEAD WITHOUT CONTRAST TECHNIQUE: Contiguous axial images were obtained from the base of the skull through the vertex without intravenous  contrast. COMPARISON:  07/15/2013 FINDINGS: Brain: Old left parietal infarct and encephalomalacia as before. Lacunar infarct involving anterior limb right internal capsule as before. Diffuse parenchymal atrophy. Patchy areas of hypoattenuation in deep and periventricular white matter bilaterally. Negative for acute intracranial hemorrhage, mass lesion, acute infarction, midline shift, or mass-effect. Acute infarct may be inapparent on noncontrast CT. Ventricles and sulci symmetric. Vascular: Atherosclerotic and physiologic  intracranial calcifications. Skull: Bone windows demonstrate no focal lesion. Sinuses/Orbits: Mucoperiosteal thickening in bilateral maxillary sinuses. Orbits unremarkable. Other: None. IMPRESSION: 1. Negative for bleed or other acute intracranial process. 2. Remote infarcts, nonspecific white matter changes, and parenchymal atrophy as before. 3. Bilateral maxillary sinus disease. Electronically Signed   By: Lucrezia Europe M.D.   On: 02/21/2016 15:10    ASSESSMENT & PLAN:   Transformed AML, on background history of MDS and myeloma Progressive leukocytosis with anemia and thrombocytopenia Major multiple co-morbidities Atrial fibrillation on chronic anticoagulation therapy  I have a very long discussion with the patient and his 3 sons: Waymond Cera and [name] I discussed with them the implication of diagnosis of acute leukemia. We do not treat patients with acute leukemia within the [facility] system. If the patient desired aggressive treatment, he would need to be transferred to other institutions for treatment. In general, with his age and multiple comorbidities, it is unlikely that the patient would be able to tolerate aggressive chemotherapy. With pre-existing hematological problems, transformed AML typically carries a very poor prognosis. The alternative plan of care will include palliative care with possible transfusion support only in the outpatient setting. I would recommend transfusion support prior to discharge. We discussed transitioning of care to either skilled nursing facility with hospice options or going home with palliative transfusion support We will consult palliative care service for goals clarification. At the end of the visit, the patient is undecided I will return tomorrow morning to check on the patient and discuss goals of care  All questions were answered. The patient knows to call the clinic with any problems, questions or concerns. I spent 60 minutes counseling the  patient face to face. The total time spent in the appointment was 80 minutes and more than 50% was on counseling.     Heath Lark, MD 02/23/2016 4:24 PM

## 2016-02-23 NOTE — Progress Notes (Signed)
ANTICOAGULATION CONSULT NOTE  Pharmacy Consult for Coumadin Indication: atrial fibrillation  No Known Allergies  Patient Measurements: Height: 5\' 5"  (165.1 cm) Weight: 244 lb 11.4 oz (111 kg) IBW/kg (Calculated) : 61.5  Vital Signs: Temp: 97.9 F (36.6 C) (12/20 0504) Temp Source: Oral (12/20 0504) BP: 137/65 (12/20 0829) Pulse Rate: 77 (12/20 0504)  Labs:  Recent Labs  02/21/16 1106 02/21/16 1315  02/21/16 1601 02/22/16 0006 02/22/16 0519 02/23/16 0612  HGB 8.4*  --   --  8.6*  --  7.9* 7.8*  HCT 27.0*  --   --  28.3*  --  26.3* 26.4*  PLT 93*  --   --  83*  --  80* 92*  LABPROT  --  22.4*  --   --   --  26.0* 26.2*  INR  --  1.93  --   --   --  2.33 2.36  CREATININE 1.16  --   --   --   --  1.00 1.12  CKTOTAL  --   --   < >  --  681* 547* 258  < > = values in this interval not displayed.  Estimated Creatinine Clearance: 55.5 mL/min (by C-G formula based on SCr of 1.12 mg/dL).     Assessment: 82 yo M presented to ED 02/23/2016 with pneumonia and was started on IV antibiotics.  Pt also noted to be on Coumadin PTA for hx afib.  INR on admission = 1.93, received 10 mg dose  INR today therapeutic at 2.36  Home Coumadin dose = 7.5mg  PO daily except 5mg  on Sundays.  Last dose 12/17.  Goal of Therapy:  INR 2-3 Monitor platelets by anticoagulation protocol: Yes   Plan:  Coumadin 7.5 mg PO x 1 tonight per home dose Daily INR  Thank you Anette Guarneri, PharmD [phone] 02/23/2016 10:53 AM

## 2016-02-23 NOTE — Progress Notes (Signed)
Plan for DC today- confirmed facility choice with family of [facility]  CSW initiated [place], [place] Social Worker [phone]

## 2016-02-23 NOTE — Discharge Summary (Addendum)
Physician Discharge Summary  Terry Robertson. ZOX:[id number] DOB: 15-Oct-1928 DOA: 02/21/2016  PCP: [name], MD  Admit date: 02/21/2016 Discharge date: 02/25/2016  Admitted From: Home  Disposition: Home with hospice service  Discharge Condition: Hospice CODE STATUS: DNR  Brief/Interim Summary: HPI: Terry Robertson. is a 82 y.o. male recently discharged 2 days ago after being admitted for Community acquired pneumonia who has a past medical history of myelodysplastic syndrome, spinal stenosis with neurogenic claudication, history of CAD, seizures and others detailed below.  The patient apparently fell down at home and his family found him on the floor early this morning.  He says that he does not know how long he had been down on the floor.  He is having an exacerbation of his chronic back pain.  The patient was noted to have slightly worsening anemia and a heme positive stool during his last admission admission.  No colonoscopy done during the visit after consultation with GI. The patient is attended by his family at home but they can't be with him 24/7. His family gives the history because the patient was initially confused and encephalopathic but improved in time with IV fluid hydration.  They found him on the floor of his house today for an unknown period of time. He is complaining of severe pack pain and inability to walk. The patient is unsure if it's because his legs are too weak or his back hurts too much.  He has a history of chronic stenosis with neurogenic claudication. His family states that he had an injury to the back in the 1970s and has had chronic progressively worsening back pain since that time. They state that he has always been able to walk, however, until this morning. Last night they were taking care of the patient and he was unable to get out of his chair because of back pain. He has not had any repeat fevers or worsening shortness of breath. No apparent neurologic  abnormalities.   In ED: Pt was noted to have worsening leukocytosis and xray findings of slightly worse pulmonary disease concerning for HCAP and he has been treated for that.  Unfortunately blast cells seen on peripheral smear and patient noted to have acute leukemia which is terminal.  He is not a candidate for aggressive chemotherapy. Pt has elected to go home with hospice services.   1. Fall at home - likely multifactorial given his advanced age and many chronic medical co-morbidities. He was down for unknown period of time and had elevated CK levels but now back down to normal after hydration. No acute findings on lumbar xrays. CT head negative for acute findings. 2. Dehydration - treated with IV fluids as above.    3. 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   S/p transfusion, Hg 8.9.  4. MDS - with tranformation AML- see above. 5. Grade 2 diastolic dysfunction - currently compensated.  6. Afib .  7. Essential hypertension - resume home medications.  8. Cerebrovascular Disease - stable.  9. Thrombocytopenia - chronic, follow, no acute changes from recent testing. Follow up with outpatient hematology clinic.  10. HCAP - fully treated.     11. DM type 2 - diet controlled.  12. CAD - stable, compensated, no chest pain, normal troponin I. EKG: no acute findings.   Code Status: DNR Family Communication:bedside Disposition Plan:Home with hospice services  Discharge Diagnoses:  Principal Problem:   HCAP (  healthcare-associated pneumonia) Active Problems:   CAD PCI to LAD/CFX in 1997. 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 [facility])   Palliative care encounter   Goals of care, counseling/discussion   Encounter for hospice care discussion  Discharge Instructions  Allergies as of 02/25/2016   No Known Allergies     Medication List    STOP taking these medications   atorvastatin 20 MG tablet Commonly known as:  LIPITOR   azithromycin 500 MG tablet Commonly known as:  ZITHROMAX   cefdinir 300 MG capsule Commonly known as:  OMNICEF   ferrous sulfate 325 (65 FE) MG tablet   furosemide 20 MG tablet Commonly known as:  LASIX   HYDROcodone-acetaminophen 5-325 MG tablet Commonly known as:  NORCO/VICODIN   predniSONE 10 MG tablet Commonly known as:  DELTASONE   VASCEPA 1 g Caps Generic drug:  Icosapent Ethyl   warfarin 5 MG tablet Commonly known as:  COUMADIN     TAKE these medications   aspirin EC 81 MG tablet Take 81 mg by mouth daily.   CARTIA XT 180 MG 24 hr capsule Generic drug:  diltiazem Take 180 mg by mouth daily.   feeding supplement (ENSURE ENLIVE) Liqd Take 237 mLs by mouth 2 (two) times daily between meals.   gabapentin 100 MG capsule Commonly known as:  NEURONTIN Take 100 mg by mouth at bedtime.   latanoprost 0.005 % ophthalmic solution Commonly known as:  XALATAN Place 1 drop into both eyes at bedtime.   levETIRAcetam 500 MG tablet Commonly known as:  KEPPRA Take 1 tablet (500 mg total) by mouth every 12 (twelve) hours.   LORazepam 2 MG/ML concentrated solution Commonly known as:  ATIVAN Take 0.3 mLs (0.6 mg total) by mouth every 4 (four) hours as needed for anxiety or sleep.   metoprolol tartrate 25 MG tablet Commonly known as:  LOPRESSOR Take 1 tablet (25 mg total) by mouth every evening.   morphine CONCENTRATE 10 MG/0.5ML  Soln concentrated solution Take 0.25 mLs (5 mg total) by mouth every 2 (two) hours as needed for severe pain (or dyspnea).   polyethylene glycol packet Commonly known as:  MIRALAX / GLYCOLAX Take 17 g by mouth daily.   vitamin C 1000 MG tablet Take 1,000 mg by mouth daily.   Vitamin D 2000 units Caps Take 2,000 Units by mouth every morning.       Contact information for follow-up providers    SANDERS,ROBYN N, MD. Schedule an appointment as soon as possible for a visit in 2 week(s).   Specialty:  Internal Medicine Contact information: [address] [place] [address] [phone]        Heath Lark, MD. Schedule an appointment as soon as possible for a visit in 3 week(s).   Specialty:  Hematology and Oncology Contact information: [place] [phone] [phone]            Contact information for after-discharge care    Destination  [facility] .   Specialty:  [place] Contact information: [address] [place] [phone]                 No Known Allergies  Procedures/Studies: Dg Chest 2 View  Result Date: 02/21/2016 CLINICAL DATA:  Fall with weakness. EXAM: CHEST  2 VIEW COMPARISON:  02/15/2016 FINDINGS: Two views study limited by positioning. Volumes are low. The cardio pericardial silhouette is enlarged. Left base collapse/ consolidation again noted with potential small left pleural effusion. New nodular density in the right mid lung may be confluence of shadows or reflect pneumonia. Pulmonary vascular congestion noted. The visualized bony structures of the thorax are intact. IMPRESSION: Cardiomegaly with vascular congestion and persistent retrocardiac collapse/ consolidation. New nodular density right mid lung may represent confluence of shadows or infectious/inflammatory process. Electronically Signed   By: Misty Stanley M.D.   On: 02/21/2016 15:09   Dg  Chest 2 View  Result Date: 02/15/2016 CLINICAL DATA:  Initial evaluation for acute fever. Evaluate for pneumonia. EXAM: CHEST  2 VIEW COMPARISON:  Prior radiograph from 01/16/2016. FINDINGS: Stable cardiomegaly.  Mediastinal silhouette within normal limits. Lungs mildly hypoinflated. A left-sided pleural effusion is similar to previous. Subtly increased left basilar opacity, which may reflect atelectasis or infiltrate. No other focal airspace disease. No pneumothorax. No acute osseous abnormality. IMPRESSION: Persistent left pleural effusion with slightly increased left basilar opacity, which may reflect atelectasis or infiltrate. Electronically Signed   By: Jeannine Boga M.D.   On: 02/15/2016 18:07   Dg Lumbar Spine Complete  Result Date: 02/21/2016 CLINICAL DATA:  Fall, back pain EXAM: LUMBAR SPINE - COMPLETE 4+ VIEW COMPARISON:  10/27/2015 FINDINGS: Normal alignment of the vertebral bodies. There is endplate sclerosis and osteophytosis. There is joint space narrowing at L4-L5 and L5-S1. No subluxation. Atherosclerotic calcification of the aorta. IMPRESSION: 1. No acute findings lumbar spine.  No change from prior. 2. Multilevel disc osteophytic disease. Electronically Signed   By: Suzy Bouchard M.D.   On: 02/21/2016 15:03   Ct Head Wo Contrast  Result Date: 02/21/2016 CLINICAL DATA:  Fall; pt found on floor this AM by family, increased confusion, generalized weakness EXAM: CT HEAD WITHOUT CONTRAST TECHNIQUE: Contiguous axial images were obtained from the base of the skull through the vertex without intravenous contrast. COMPARISON:  07/15/2013 FINDINGS: Brain: Old left parietal infarct and encephalomalacia as before. Lacunar infarct involving anterior limb right internal capsule as before. Diffuse parenchymal atrophy. Patchy areas of hypoattenuation in deep and periventricular white matter bilaterally. Negative for acute intracranial hemorrhage, mass lesion, acute infarction, midline shift,  or mass-effect. Acute infarct may be inapparent on noncontrast CT. Ventricles and sulci symmetric. Vascular: Atherosclerotic and physiologic intracranial calcifications. Skull: Bone windows demonstrate no focal lesion. Sinuses/Orbits: Mucoperiosteal thickening in bilateral maxillary sinuses. Orbits unremarkable. Other: None. IMPRESSION: 1. Negative for bleed or other acute intracranial process. 2. Remote infarcts, nonspecific white matter changes, and parenchymal atrophy as before. 3. Bilateral maxillary sinus disease. Electronically Signed   By: Lucrezia Europe M.D.   On: 02/21/2016 15:10    Subjective: Pt says that he feels well.  He is not in any pain.     Discharge Exam: Vitals:   02/24/16 2115 02/25/16 0456  BP: (!) 148/62 138/64  Pulse: 65 73  Resp: 18 18  Temp: 97.8 F (36.6 C) 97.7 F (36.5 C)   Vitals:   02/24/16 1725 02/24/16 1848 02/24/16 2115 02/25/16 0456  BP: (!) 146/63 Marland Kitchen)  148/63 (!) 148/62 138/64  Pulse: 71 71 65 73  Resp:  (!) '22 18 18  ' Temp:  97.7 F (36.5 C) 97.8 F (36.6 C) 97.7 F (36.5 C)  TempSrc:  Oral Oral Oral  SpO2:   95% 94%  Weight:      Height:       General: Pt is alert, awake, not in acute distress Cardiovascular: RRR, S1/S2 +, no rubs, no gallops Respiratory: CTA bilaterally, no wheezing, no rhonchi Abdominal: Soft, NT, ND, bowel sounds + Extremities: no edema, no cyanosis  The results of significant diagnostics from this hospitalization (including imaging, microbiology, ancillary and laboratory) are listed below for reference.     Microbiology: Recent Results (from the past 240 hour(s))  Blood Culture (routine x 2)     Status: None   Collection Time: 02/15/16  6:10 PM  Result Value Ref Range Status   Specimen Description BLOOD LEFT ANTECUBITAL  Final   Special Requests BOTTLES DRAWN AEROBIC AND ANAEROBIC 5CC  Final   Culture NO GROWTH 5 DAYS  Final   Report Status 02/20/2016 FINAL  Final  Blood Culture (routine x 2)     Status: None    Collection Time: 02/15/16  6:30 PM  Result Value Ref Range Status   Specimen Description BLOOD LEFT HAND  Final   Special Requests IN PEDIATRIC BOTTLE 4CC  Final   Culture NO GROWTH 5 DAYS  Final   Report Status 02/20/2016 FINAL  Final  Urine culture     Status: None   Collection Time: 02/15/16  6:53 PM  Result Value Ref Range Status   Specimen Description URINE, CLEAN CATCH  Final   Special Requests NONE  Final   Culture NO GROWTH  Final   Report Status 02/17/2016 FINAL  Final  Culture, blood (routine x 2) Call MD if unable to obtain prior to antibiotics being given     Status: None (Preliminary result)   Collection Time: 02/21/16  9:18 PM  Result Value Ref Range Status   Specimen Description BLOOD LEFT ANTECUBITAL  Final   Special Requests IN PEDIATRIC BOTTLE 2CC  Final   Culture NO GROWTH 3 DAYS  Final   Report Status PENDING  Incomplete  Culture, blood (routine x 2) Call MD if unable to obtain prior to antibiotics being given     Status: None (Preliminary result)   Collection Time: 02/21/16  9:18 PM  Result Value Ref Range Status   Specimen Description BLOOD LEFT ANTECUBITAL  Final   Special Requests IN PEDIATRIC BOTTLE 2CC  Final   Culture NO GROWTH 3 DAYS  Final   Report Status PENDING  Incomplete     Labs: BNP (last 3 results) No results for input(s): BNP in the last 8760 hours. Basic Metabolic Panel:  Recent Labs Lab 02/19/16 0332 02/21/16 1106 02/22/16 0519 02/23/16 0612 02/24/16 0517  NA 142 138 139 137 139  K 3.6 3.5 3.6 4.3 3.9  CL 108 106 108 106 107  CO2 '26 22 22 24 25  ' GLUCOSE 111* 104* 80 178* 141*  BUN 23* '10 7 8 12  ' CREATININE 1.18 1.16 1.00 1.12 0.98  CALCIUM 8.2* 8.1* 7.6* 7.7* 7.8*   Liver Function Tests:  Recent Labs Lab 02/21/16 1315 02/22/16 0519 02/23/16 0612 02/24/16 0517  AST 94* 69* 60* 61*  ALT 110* 82* 72* 73*  ALKPHOS 46 44 45 46  BILITOT 0.8 0.9 0.6 0.4  PROT 8.2* 7.4 7.4 7.7  ALBUMIN 2.4* 2.1* 1.9* 2.0*  No results for  input(s): LIPASE, AMYLASE in the last 168 hours.  Recent Labs Lab 02/21/16 1317  AMMONIA 19   CBC:  Recent Labs Lab 02/21/16 1106 02/21/16 1601 02/22/16 0519 02/23/16 0612 02/24/16 0517 02/24/16 2110  WBC 23.2* 21.6* 24.9* 25.1* 43.7*  --   NEUTROABS  --  4.8 9.5* 12.6* 13.1*  --   HGB 8.4* 8.6* 7.9* 7.8* 7.3* 8.9*  HCT 27.0* 28.3* 26.3* 26.4* 24.4* 28.6*  MCV 88.2 89.0 91.3 89.5 89.4  --   PLT 93* 83* 80* 92* 92*  --    Cardiac Enzymes:  Recent Labs Lab 02/21/16 1339 02/22/16 0006 02/22/16 0519 02/23/16 0612  CKTOTAL 772* 681* 547* 258   BNP: Invalid input(s): POCBNP CBG:  Recent Labs Lab 02/24/16 0739 02/24/16 1231 02/24/16 1658 02/24/16 2206 02/25/16 0749  GLUCAP 130* 107* 123* 134* 78   D-Dimer No results for input(s): DDIMER in the last 72 hours. Hgb A1c No results for input(s): HGBA1C in the last 72 hours. Lipid Profile No results for input(s): CHOL, HDL, LDLCALC, TRIG, CHOLHDL, LDLDIRECT in the last 72 hours. Thyroid function studies No results for input(s): TSH, T4TOTAL, T3FREE, THYROIDAB in the last 72 hours.  Invalid input(s): FREET3 Anemia work up No results for input(s): VITAMINB12, FOLATE, FERRITIN, TIBC, IRON, RETICCTPCT in the last 72 hours. Urinalysis    Component Value Date/Time   COLORURINE YELLOW 02/21/2016 1355   APPEARANCEUR HAZY (A) 02/21/2016 1355   LABSPEC 1.018 02/21/2016 1355   PHURINE 6.0 02/21/2016 1355   GLUCOSEU NEGATIVE 02/21/2016 1355   HGBUR NEGATIVE 02/21/2016 1355   [place] 02/21/2016 1355   KETONESUR NEGATIVE 02/21/2016 1355   PROTEINUR 100 (A) 02/21/2016 1355   UROBILINOGEN 1.0 08/09/2014 1115   NITRITE NEGATIVE 02/21/2016 1355   LEUKOCYTESUR NEGATIVE 02/21/2016 1355   Sepsis Labs Invalid input(s): PROCALCITONIN,  WBC,  LACTICIDVEN Microbiology Recent Results (from the past 240 hour(s))  Blood Culture (routine x 2)     Status: None   Collection Time: 02/15/16  6:10 PM  Result Value Ref  Range Status   Specimen Description BLOOD LEFT ANTECUBITAL  Final   Special Requests BOTTLES DRAWN AEROBIC AND ANAEROBIC 5CC  Final   Culture NO GROWTH 5 DAYS  Final   Report Status 02/20/2016 FINAL  Final  Blood Culture (routine x 2)     Status: None   Collection Time: 02/15/16  6:30 PM  Result Value Ref Range Status   Specimen Description BLOOD LEFT HAND  Final   Special Requests IN PEDIATRIC BOTTLE 4CC  Final   Culture NO GROWTH 5 DAYS  Final   Report Status 02/20/2016 FINAL  Final  Urine culture     Status: None   Collection Time: 02/15/16  6:53 PM  Result Value Ref Range Status   Specimen Description URINE, CLEAN CATCH  Final   Special Requests NONE  Final   Culture NO GROWTH  Final   Report Status 02/17/2016 FINAL  Final  Culture, blood (routine x 2) Call MD if unable to obtain prior to antibiotics being given     Status: None (Preliminary result)   Collection Time: 02/21/16  9:18 PM  Result Value Ref Range Status   Specimen Description BLOOD LEFT ANTECUBITAL  Final   Special Requests IN PEDIATRIC BOTTLE 2CC  Final   Culture NO GROWTH 3 DAYS  Final   Report Status PENDING  Incomplete  Culture, blood (routine x 2) Call MD if unable to obtain prior to antibiotics being given  Status: None (Preliminary result)   Collection Time: 02/21/16  9:18 PM  Result Value Ref Range Status   Specimen Description BLOOD LEFT ANTECUBITAL  Final   Special Requests IN PEDIATRIC BOTTLE 2CC  Final   Culture NO GROWTH 3 DAYS  Final   Report Status PENDING  Incomplete   Time coordinating discharge: 35 minutes  SIGNED:  Irwin Brakeman, MD  [facility] 02/25/2016, 7:55 AM Pager   If 7PM-7AM, please contact night-coverage [email or web address] Password [email or web address]

## 2016-02-23 NOTE — Patient Outreach (Signed)
[facility]) Care Management  02/23/2016  Terry Robertson. 12-25-1928 [id number]   [facility] Difficult Case Discussion  Admissions: 2 02/21/16 - 02/23/16 HCAP  02/15/16 - 02/19/16 HCAP  ED visits:  1  SNF 02/23/2016  Referral date:  02/18/16 during IP admission.  H/o patient discharged on Saturday 12/16.  Initial [facility] outreach attempt completed on 02/22/16 - unsuccessful.   PMH: HCAP, Essential HTN, DM type 2, Paroxysmal atrial fibrillation, Diastolic dysfunction:  Grade 2, CAD, Left ventricular diastolic dysfunction, NYHA class 2 ), Dyslipidemia, Myelodysplastic syndrome, Cerebral infarction, long term current use of anticoagulant therapy, MGUS (monoclonal gammopathy of unknown significance), multiple myeloma in remission, Thrombocytopenia, Leukocytosis, seizures, spinal stenosis with neurogenic claudication, severe obesity, Fall at home.  82yo male presented back to ED on 02/21/16 with a Fall; found on the floor. Confused, dehydrated, elevated CK levels; worsening leukocytosis (discharged  2 days prior on prednisone and x-ray (HCAP); worsening anemia, heme + stool during previous adm. (GI evaluated during 1st admission and OP follow-up plan established). C/O severe back pain, unable to walk. Unable to get out of chair night prior due to back pain.  Family cannot be with patient 24/7.  H/o 2nd admission / readmission findings:  dehydrated, elevated CK levels, worsening leukocytosis, HCAP, worsening anemia. Family unable to provide 24/7 care.  1st Discharge: : PT recommended SNF at discharge.  Pt and pt daughter-refused.  Patient was active with [facility] services prior to admission and services resumed at discharge. RN, PT, OT [facility]).  RN CM verified with [facility] contact [name] that RN visits were made on 12/6, 12/8, 12/12 and (12/17 post discharge). Last PT visit 02/15/16 and NO CNA order.    2nd D/C to SNF /  ([facility]).  H/o [facility] submitted.  Plan: Telephonic RN CM closing  services.  [facility] SW Referral  -Patient discharged to SNF - [facility] 02/23/16.  CM services to continue to assess if patient will return home versus  LTC.    Nathaneil Canary, BSN, RN, [place] Management Care Management Coordinator [phone] Direct [phone] Cell [phone] Office [phone] Fax [email or web address]

## 2016-02-24 ENCOUNTER — Other Ambulatory Visit: Payer: Self-pay | Admitting: Licensed Clinical Social Worker

## 2016-02-24 ENCOUNTER — Encounter: Payer: Self-pay | Admitting: Licensed Clinical Social Worker

## 2016-02-24 DIAGNOSIS — C9202 Acute myeloblastic leukemia, in relapse: Secondary | ICD-10-CM

## 2016-02-24 DIAGNOSIS — Z7189 Other specified counseling: Secondary | ICD-10-CM

## 2016-02-24 DIAGNOSIS — Z515 Encounter for palliative care: Secondary | ICD-10-CM

## 2016-02-24 LAB — CBC WITH DIFFERENTIAL/PLATELET
BLASTS: 0 %
Band Neutrophils: 0 %
Basophils Absolute: 0 10*3/uL (ref 0.0–0.1)
Basophils Relative: 0 %
Eosinophils Absolute: 0 10*3/uL (ref 0.0–0.7)
Eosinophils Relative: 0 %
HEMATOCRIT: 24.4 % — AB (ref 39.0–52.0)
HEMOGLOBIN: 7.3 g/dL — AB (ref 13.0–17.0)
Lymphocytes Relative: 64 %
Lymphs Abs: 28 10*3/uL — ABNORMAL HIGH (ref 0.7–4.0)
MCH: 26.7 pg (ref 26.0–34.0)
MCHC: 29.9 g/dL — ABNORMAL LOW (ref 30.0–36.0)
MCV: 89.4 fL (ref 78.0–100.0)
MYELOCYTES: 0 %
Metamyelocytes Relative: 0 %
Monocytes Absolute: 2.6 10*3/uL — ABNORMAL HIGH (ref 0.1–1.0)
Monocytes Relative: 6 %
NEUTROS PCT: 30 %
NRBC: 0 /100{WBCs}
Neutro Abs: 13.1 10*3/uL — ABNORMAL HIGH (ref 1.7–7.7)
Other: 0 %
PROMYELOCYTES ABS: 0 %
Platelets: 92 10*3/uL — ABNORMAL LOW (ref 150–400)
RBC: 2.73 MIL/uL — AB (ref 4.22–5.81)
RDW: 19.3 % — ABNORMAL HIGH (ref 11.5–15.5)
WBC: 43.7 10*3/uL — AB (ref 4.0–10.5)

## 2016-02-24 LAB — COMPREHENSIVE METABOLIC PANEL
ALT: 73 U/L — ABNORMAL HIGH (ref 17–63)
AST: 61 U/L — ABNORMAL HIGH (ref 15–41)
Albumin: 2 g/dL — ABNORMAL LOW (ref 3.5–5.0)
Alkaline Phosphatase: 46 U/L (ref 38–126)
Anion gap: 7 (ref 5–15)
BUN: 12 mg/dL (ref 6–20)
CO2: 25 mmol/L (ref 22–32)
Calcium: 7.8 mg/dL — ABNORMAL LOW (ref 8.9–10.3)
Chloride: 107 mmol/L (ref 101–111)
Creatinine, Ser: 0.98 mg/dL (ref 0.61–1.24)
GFR calc Af Amer: >60 mL/min (ref >60)
GFR calc non Af Amer: >60 mL/min (ref >60)
Glucose, Bld: 141 mg/dL — ABNORMAL HIGH (ref 65–99)
Potassium: 3.9 mmol/L (ref 3.5–5.1)
Sodium: 139 mmol/L (ref 135–145)
Total Bilirubin: 0.4 mg/dL (ref 0.3–1.2)
Total Protein: 7.7 g/dL (ref 6.5–8.1)

## 2016-02-24 LAB — PROTIME-INR
INR: 3.23
Prothrombin Time: 33.7 seconds — ABNORMAL HIGH (ref 11.4–15.2)

## 2016-02-24 LAB — HEMOGLOBIN AND HEMATOCRIT, BLOOD
HCT: 28.6 % — ABNORMAL LOW (ref 39.0–52.0)
Hemoglobin: 8.9 g/dL — ABNORMAL LOW (ref 13.0–17.0)

## 2016-02-24 LAB — GLUCOSE, CAPILLARY
GLUCOSE-CAPILLARY: 134 mg/dL — AB (ref 65–99)
Glucose-Capillary: 130 mg/dL — ABNORMAL HIGH (ref 65–99)
Glucose-Capillary: 107 mg/dL — ABNORMAL HIGH (ref 65–99)
Glucose-Capillary: 123 mg/dL — ABNORMAL HIGH (ref 65–99)

## 2016-02-24 LAB — PREPARE RBC (CROSSMATCH)

## 2016-02-24 MED ORDER — ACETAMINOPHEN 325 MG PO TABS
650.0000 mg | ORAL_TABLET | Freq: Once | ORAL | Status: AC
  Administered 2016-02-24: 650 mg via ORAL
  Filled 2016-02-24: qty 2

## 2016-02-24 MED ORDER — LORAZEPAM 2 MG/ML PO CONC: 0.5000 mg | ORAL | Status: DC | PRN

## 2016-02-24 MED ORDER — AMOXICILLIN-POT CLAVULANATE 875-125 MG PO TABS
1.0000 | ORAL_TABLET | Freq: Two times a day (BID) | ORAL | Status: DC
  Administered 2016-02-24 (×2): 1 via ORAL
  Filled 2016-02-24 (×2): qty 1

## 2016-02-24 MED ORDER — DIPHENHYDRAMINE HCL 25 MG PO CAPS
25.0000 mg | ORAL_CAPSULE | Freq: Once | ORAL | Status: AC
  Administered 2016-02-24: 25 mg via ORAL
  Filled 2016-02-24: qty 1

## 2016-02-24 MED ORDER — SODIUM CHLORIDE 0.9 % IV SOLN
Freq: Once | INTRAVENOUS | Status: AC
  Administered 2016-02-24: 11:00:00 via INTRAVENOUS

## 2016-02-24 MED ORDER — MORPHINE SULFATE (CONCENTRATE) 10 MG/0.5ML PO SOLN
5.0000 mg | ORAL | Status: DC | PRN
  Administered 2016-02-25 (×4): 5 mg via ORAL
  Filled 2016-02-24 (×4): qty 0.5

## 2016-02-24 NOTE — Progress Notes (Signed)
CM received consult : Hospice at home. Please offer choice - he will need a bed. CM spoke with pt regarding home hospice. Pt stated it was ok to speak with daughter. Choice provided and [facility] selected per pt/daughter. CM made referral with [facility]. Whitman Hero RN, BSN,CM

## 2016-02-24 NOTE — Consult Note (Signed)
   [facility] CM Inpatient Consult   02/24/2016  Terry Robertson. 1928/10/30 [id number]   [facility] Care Management follow up. Notified by inpatient RNCM today that discharge plan will be with hospice services at discharge. Also received notification from [place]. Will request case closure from [facility] team when patient discharges with hospice services as [place] Management will not be needed at the time.   Marthenia Rolling, MSN-Ed, RN,BSN [facility] Liaison [phone]

## 2016-02-24 NOTE — Progress Notes (Addendum)
PROGRESS NOTE    [name].  FMB:[id number]  DOB: [birth year]  DOA: 02/21/2016 PCP: [name], MD Outpatient Specialists:   Hospital course: Presented as readmit after falling down at home on floor for unknown length of time, found to be dehydrated, possibly with HCAP, severe back pain, negative plain films, has chronic back pain, likely will need SNF.   Assessment & Plan:   1. Fall at home - likely multifactorial given his advanced age and many chronic medical co-morbidities.  He was down for unknown period of time and now has elevated CK levels.  He is being treated with IV fluid hydration, repeat CK levels show improvement.  PT/OT consult requested.   No acute findings on lumbar xrays.  CT head negative for acute findings. 2. Acute Leukemia - Pt has poor prognosis, I spoke with oncology and he is not a good candidate for initiating aggressive chemotherapy for this and patient has decided to go home with hospice services.  Planning for him to go home tomorrow, getting equipment for home in place today.  Appreciate palliative care team assistance and oncology assistance.  3. Dehydration - treated with IV fluids.   4. MDS - chronic - followed by heme/onc Dr. Alvy Bimler.  He will follow up with them outpatient.  5. Grade 2 diastolic dysfunction - currently compensated.   6. Afib with chronic anticoagulation with warfarin - asked for pharmacist to assist with managing warfarin in hospital. 7. Essential hypertension - resume home medications and follow.  8. Cerebrovascular Disease - stable, follow, continue measures for secondary prevention.  9. Thrombocytopenia - chronic, follow, no acute changes from recent testing.  10.  Leukocytosis - likely secondary to being on steroids, possibly due to lung infection 11. HCAP - empirically treating for HCAP given recent admission, see orders and antibiotic protocol.  Pt doesn't have any symptoms of cough, SOB or fever.  Finishing course of  antibiotics.   12. Obesity: bmi 40 13. Mild rhabdomyolysis -resolved   DVT Prophylaxis: warfarin/ted hoses Code Status: full   Family Communication: bedside  Disposition Plan: Home with hospice   Subjective: Pt without many complaint Objective: Vitals:   02/23/16 1822 02/23/16 2126 02/24/16 0551 02/24/16 0815  BP: (!) 138/59 138/62 135/60 (!) 144/62  Pulse: 79 70 75   Resp:  16 18   Temp:  98.2 F (36.8 C) 98 F (36.7 C)   TempSrc:  Oral Oral   SpO2:  97% 95%   Weight:      Height:        Intake/Output Summary (Last 24 hours) at 02/24/16 1351 Last data filed at 02/24/16 1332  Gross per 24 hour  Intake              240 ml  Output             1400 ml  Net            -1160 ml   Filed Weights   02/21/16 2140  Weight: 111 kg (244 lb 11.4 oz)    Exam:  Constitutional: He appears well-developedand well-nourished.  Patient makes poor eye contact. HENT: Head: Normocephalicand atraumatic.  Eyes: EOMare normal. Pupils are equal, round, and reactive to light.  Neck: No JVDpresent.  Cardiovascular: Normal rate, regular rhythmand intact distal pulses. Murmurheard. Loud systolic murmur heard best over the left second ICS. Pulmonary/Chest: Effort normaland breath sounds normal.  Abdominal: Soft.  Neurological: He is alert. Alert, oriented x3. Cranial nerves appear intact, sensation is  hyperesthetic in the right leg but normal elsewhere. No weakness to plantar or dorsi flexion. No weakness in the upper extremities. No overt facial droop. Gait is deferred. Skin: Skin is warmand dry.   Data Reviewed: Basic Metabolic Panel:  Recent Labs Lab 02/19/16 0332 02/21/16 1106 02/22/16 0519 02/23/16 0612 02/24/16 0517  NA 142 138 139 137 139  K 3.6 3.5 3.6 4.3 3.9  CL 108 106 108 106 107  CO2 '26 22 22 24 25  ' GLUCOSE 111* 104* 80 178* 141*  BUN 23* '10 7 8 12  ' CREATININE 1.18 1.16 1.00 1.12 0.98  CALCIUM 8.2* 8.1* 7.6* 7.7* 7.8*   Liver Function Tests:  Recent  Labs Lab 02/21/16 1315 02/22/16 0519 02/23/16 0612 02/24/16 0517  AST 94* 69* 60* 61*  ALT 110* 82* 72* 73*  ALKPHOS 46 44 45 46  BILITOT 0.8 0.9 0.6 0.4  PROT 8.2* 7.4 7.4 7.7  ALBUMIN 2.4* 2.1* 1.9* 2.0*   No results for input(s): LIPASE, AMYLASE in the last 168 hours.  Recent Labs Lab 02/21/16 1317  AMMONIA 19   CBC:  Recent Labs Lab 02/21/16 1106 02/21/16 1601 02/22/16 0519 02/23/16 0612 02/24/16 0517  WBC 23.2* 21.6* 24.9* 25.1* 43.7*  NEUTROABS  --  4.8 9.5* 12.6* 13.1*  HGB 8.4* 8.6* 7.9* 7.8* 7.3*  HCT 27.0* 28.3* 26.3* 26.4* 24.4*  MCV 88.2 89.0 91.3 89.5 89.4  PLT 93* 83* 80* 92* 92*   Cardiac Enzymes:  Recent Labs Lab 02/21/16 1339 02/22/16 0006 02/22/16 0519 02/23/16 0612  CKTOTAL 772* 681* 547* 258   CBG (last 3)   Recent Labs  02/23/16 2121 02/24/16 0739 02/24/16 1231  GLUCAP 175* 130* 107*   Recent Results (from the past 240 hour(s))  Blood Culture (routine x 2)     Status: None   Collection Time: 02/15/16  6:10 PM  Result Value Ref Range Status   Specimen Description BLOOD LEFT ANTECUBITAL  Final   Special Requests BOTTLES DRAWN AEROBIC AND ANAEROBIC 5CC  Final   Culture NO GROWTH 5 DAYS  Final   Report Status 02/20/2016 FINAL  Final  Blood Culture (routine x 2)     Status: None   Collection Time: 02/15/16  6:30 PM  Result Value Ref Range Status   Specimen Description BLOOD LEFT HAND  Final   Special Requests IN PEDIATRIC BOTTLE 4CC  Final   Culture NO GROWTH 5 DAYS  Final   Report Status 02/20/2016 FINAL  Final  Urine culture     Status: None   Collection Time: 02/15/16  6:53 PM  Result Value Ref Range Status   Specimen Description URINE, CLEAN CATCH  Final   Special Requests NONE  Final   Culture NO GROWTH  Final   Report Status 02/17/2016 FINAL  Final  Culture, blood (routine x 2) Call MD if unable to obtain prior to antibiotics being given     Status: None (Preliminary result)   Collection Time: 02/21/16  9:18 PM    Result Value Ref Range Status   Specimen Description BLOOD LEFT ANTECUBITAL  Final   Special Requests IN PEDIATRIC BOTTLE 2CC  Final   Culture NO GROWTH 2 DAYS  Final   Report Status PENDING  Incomplete  Culture, blood (routine x 2) Call MD if unable to obtain prior to antibiotics being given     Status: None (Preliminary result)   Collection Time: 02/21/16  9:18 PM  Result Value Ref Range Status   Specimen Description BLOOD LEFT  ANTECUBITAL  Final   Special Requests IN PEDIATRIC BOTTLE 2CC  Final   Culture NO GROWTH 2 DAYS  Final   Report Status PENDING  Incomplete     Studies: No results found. Scheduled Meds: . sodium chloride   Intravenous Once  . amoxicillin-clavulanate  1 tablet Oral Q12H  . diltiazem  180 mg Oral Daily  . feeding supplement (ENSURE ENLIVE)  237 mL Oral BID BM  . furosemide  20 mg Oral BH-q7a  . gabapentin  100 mg Oral QHS  .  HYDROmorphone (DILAUDID) injection  0.5 mg Intravenous Once  . insulin aspart  0-15 Units Subcutaneous TID WC  . latanoprost  1 drop Both Eyes QHS  . levETIRAcetam  500 mg Oral Q12H  . metoprolol tartrate  25 mg Oral QPM  . omega-3 acid ethyl esters  2 g Oral BID  . polyethylene glycol  17 g Oral Daily  . sodium chloride flush  3 mL Intravenous Q12H   Continuous Infusions:   Principal Problem:   HCAP (healthcare-associated pneumonia) Active Problems:   CAD PCI to LAD/CFX in 1997. 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

## 2016-02-24 NOTE — Progress Notes (Signed)
Terry Robertson.   DOB:April 23, 1928   KE:[id number]    Subjective: I saw this patient this morning. He feels well. He denies chest pain, dizziness or shortness of breath  Objective:  Vitals:   02/24/16 0551 02/24/16 0815  BP: 135/60 (!) 144/62  Pulse: 75   Resp: 18   Temp: 98 F (36.7 C)      Intake/Output Summary (Last 24 hours) at 02/24/16 1040 Last data filed at 02/24/16 0911  Gross per 24 hour  Intake              240 ml  Output              750 ml  Net             -510 ml    GENERAL:alert, no distress and comfortable SKIN: skin color, texture, turgor are normal, no rashes or significant lesions EYES: normal, Conjunctiva are pink and non-injected, sclera clear Musculoskeletal:no cyanosis of digits and no clubbing  NEURO: alert & oriented x 3 with fluent speech, no focal motor/sensory deficits   Labs:  Lab Results  Component Value Date   WBC 43.7 (H) 02/24/2016   HGB 7.3 (L) 02/24/2016   HCT 24.4 (L) 02/24/2016   MCV 89.4 02/24/2016   PLT 92 (L) 02/24/2016   NEUTROABS 13.1 (H) 02/24/2016    Lab Results  Component Value Date   NA 139 02/24/2016   K 3.9 02/24/2016   CL 107 02/24/2016   CO2 25 02/24/2016    Assessment & Plan:   Transformed AML, on background history of MDS and myeloma Progressive leukocytosis with anemia and thrombocytopenia Major multiple co-morbidities Atrial fibrillation on chronic anticoagulation therapy I attempted to call his family several times but unable to get hold of them. I discussed the case with the patient and also with the hospitalist. The patient has made an informed decision not to pursue aggressive chemotherapy. We discussed transfusion support. Due to major cardiovascular comorbidities, I recommend giving him blood transfusion to keep hemoglobin greater than 8. He agreed to proceed. Appreciate palliative care consult. I recommend consideration for home-based palliative care or inpatient hospice facility if family members are  not able to take care of him. With pre-existing hematological problems, transformed AML typically carries a very poor prognosis. With significant progressive leukocytosis, at current trend, I estimate prognosis to be less than 2 weeks.  Heath Lark, MD 02/24/2016  10:40 AM

## 2016-02-24 NOTE — Consult Note (Signed)
Consultation Note Date: 02/24/2016   Patient Name: Terry Robertson.  DOB: Aug 18, 1928  MRN: [id number]  Age / Sex: 82 y.o., male  PCP: [name], MD Referring Physician: Murlean Iba, MD  Reason for Consultation: Establishing goals of care  HPI/Patient Profile: 82 y.o. male  with past medical history of CAD, afib, CHF, AS, multiple myeloma, DM, and chronic back pain who was admitted on 02/21/2016 with HCAP after being found down alone at home.  Terry Robertson had a recent admission for CAP and had been discharged on 12/12.  During this admission his blood counts have taken a significant turn for the worse.  His oncologist, Dr. Alvy Bimler, who knows him well has recommended a Palliative consult.  Clinical Assessment and Goals of Care: I met with Terry Robertson at bedside.  While we were talking his close friend Derryl Harbor came into the room and his daugther, [name] [facility]) called on the phone.  Terry Robertson has 5 children and many grand children.  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  He is calm and pleasant when we talk.  We talked about his hospitalization.  He did not seem to understand his current health situation.  His daughter called and I was able to discuss his prognosis with both of them at the same time.  Initially Terry Robertson still did not understand, but after speaking with his daughter - he realized that his time was short.  Terry Robertson and Enid Derry decided she would come to [facility] (from [place]) and care for him.  They want him to go home with Hospice Services.     We discussed no further chemotherapy, no further blood transfusions, no further scheduled doctor's office visits - rather he will go home and enjoy the holiday time with his family.  We discussed hospice services at home.  We  also discussed Hospice House if it is needed.  Terry Robertson is tearful, but at peace.  He is [facility] and would appreciate a visit from the [place].  All questions from his daughter and friend, Derryl Harbor, were answered.  Primary Decision Maker:  PATIENT and daughter [name]    SUMMARY OF RECOMMENDATIONS    Home with Hospice Services.   Will need Hospice bed at home. Daughter Enid Derry is HCPOA and primary care taker [phone]).  I spoke with Enid Derry - she understands his prognosis. Medications ordered for comfort - please prescribe morphine solution and ativan SL on d/c. Maintenance medications discontinued including aspirin, statin, vitamins, coumadin.  Code Status/Advance Care Planning:  DNR    Symptom Management:   Per primary team.  Patient has no complaints currently.  Additional Recommendations (Limitations, Scope, Preferences):  Avoid Hospitalization and Full Comfort Care  Psycho-social/Spiritual:   Desire for further Chaplaincy support:yes  Additional Recommendations: Education on Hospice  Prognosis:   < 2 weeks due to acute leukemia  Discharge Planning: Home with  Hospice      Primary Diagnoses: Present on Admission: . HCAP (healthcare-associated pneumonia) . Cerebral infarction ([place]) . Diastolic dysfunction: Grade 2 . Essential hypertension . Left ventricular diastolic dysfunction, NYHA class 2 (grade 2 dysfunction with elevated filling pressures) . MGUS (monoclonal gammopathy of unknown significance) . Multiple myeloma in remission ([place]) . Myelodysplastic syndrome ([place]) . Thrombocytopenia ([place]) . Leukocytosis   I have reviewed the medical record, interviewed the patient and family, and examined the patient. The following aspects are pertinent.  Past Medical History:  Diagnosis Date  . Aortic stenosis, mild 08/10/2014   : EF 60-65%, mod Conc LVH, cannot assess Diastolic Fxn.  Mild AS. Mild MVP without MR., Mod LA/RA dilation.  . Asbestosis  [facility])    family unclear where he was evaluated in the past  . BPH (benign prostatic hyperplasia)    TURP in 12/2010  . CAD S/P percutaneous coronary angioplasty 1997   status post remote PCI to the LAD and circumflex in 1997, heart cath in 2008 showed widely patent stents in the circumflex and LAD.  RCA had a mid lesion and then was totally occluded distally.  . CHF (congestive heart failure) ([place])   . Complication of anesthesia    " i HAD A SEZIURE "  . Diabetes mellitus   . Essential hypertension   . HCAP (healthcare-associated pneumonia) 02/21/2016  . MGUS (monoclonal gammopathy of unknown significance) 09/21/2014  . Multiple myeloma ([place]) 09/29/2014  . Myelodysplastic syndrome (HCC)    w/mild anemia an neutropenia  . Obesity, morbid ([place])    BMI  . OSA on CPAP   . Paroxysmal atrial fibrillation (HCC)    on coumadin; Diagnosed at the time of his 1st CVA.  CHA2DS2VASC = 6. On  Warfarin  . Seizures ([place])    Post CVA; on Keppra; stable  . ST-segment elevation myocardial infarction (STEMI) of inferior wall ([place]) 1997   inferior MI with left circumflex stent  . Stroke ([place]) 08/2011, 03/2012   L MCA (Afib RVR while admitted for hypotension) - expressive aphasia (almost totally recovered)   Social History   Social History  . Marital status: Widowed    Spouse name: N/A  . Number of children: 9  . Years of education: 50   Social History Main Topics  . Smoking status: Former Smoker    Packs/day: 1.00    Years: 10.00    Types: Cigarettes    Quit date: 03/08/1988  . Smokeless tobacco: Never Used     Comment: smoked cigars and cigarettes  . Alcohol use No  . Drug use: No  . Sexual activity: Not Asked   Other Topics Concern  . None   Social History Narrative   Pt is widowed father of 65, 76 grandchildren, 72 great-grandchildren and 3 great-great-grandchildren.  He does not get routine exercise.   Patient has a high school education.   Patient is right-handed.   Patient drinks one  cup of coffee daily.   Living with 2 sons.     Family History  Problem Relation Age of Onset  . Heart disease Mother   . Heart attack Father 64   Scheduled Meds: . sodium chloride   Intravenous Once  . acetaminophen  650 mg Oral Once  . amoxicillin-clavulanate  1 tablet Oral Q12H  . aspirin EC  81 mg Oral Daily  . atorvastatin  20 mg Oral q1800  . cholecalciferol  2,000 Units Oral BH-q7a  . diltiazem  180 mg Oral Daily  .  diphenhydrAMINE  25 mg Oral Once  . feeding supplement (ENSURE ENLIVE)  237 mL Oral BID BM  . ferrous sulfate  325 mg Oral Q breakfast  . furosemide  20 mg Oral BH-q7a  . gabapentin  100 mg Oral QHS  .  HYDROmorphone (DILAUDID) injection  0.5 mg Intravenous Once  . insulin aspart  0-15 Units Subcutaneous TID WC  . latanoprost  1 drop Both Eyes QHS  . levETIRAcetam  500 mg Oral Q12H  . metoprolol tartrate  25 mg Oral QPM  . omega-3 acid ethyl esters  2 g Oral BID  . polyethylene glycol  17 g Oral Daily  . sodium chloride flush  3 mL Intravenous Q12H  . vitamin C  1,000 mg Oral Daily  . Warfarin - Pharmacist Dosing Inpatient   Does not apply q1800   Continuous Infusions: PRN Meds:.sodium chloride, HYDROmorphone (DILAUDID) injection, sodium chloride flush No Known Allergies Review of Systems  Constitutional: Positive for activity change and fatigue.  HENT: Negative.   Eyes: Negative.   Respiratory: Negative.   Cardiovascular: Positive for leg swelling.  Gastrointestinal: Negative.   Endocrine: Negative.   Genitourinary: Negative.   Musculoskeletal: Positive for back pain.  Skin: Negative.   Neurological: Positive for weakness.  Hematological: Bruises/bleeds easily.  Psychiatric/Behavioral: Negative.  Negative for agitation, hallucinations and sleep disturbance.   10 sys  Physical Exam  Constitutional: He is oriented to person, place, and time. He appears well-developed and well-nourished. No distress.  HENT:  Head: Atraumatic.  Eyes: EOM are  normal. Pupils are equal, round, and reactive to light. No scleral icterus.  Neck: Normal range of motion. Neck supple.  Cardiovascular: Normal rate and normal heart sounds.   Pulmonary/Chest: Effort normal and breath sounds normal.  Abdominal: Soft. Bowel sounds are normal.  Musculoskeletal: He exhibits edema. He exhibits no tenderness or deformity.  Neurological: He is oriented to person, place, and time.  Skin: Skin is warm and dry.  Psychiatric: He has a normal mood and affect. His behavior is normal. Judgment and thought content normal.    Vital Signs: BP (!) 144/62   Pulse 75   Temp 98 F (36.7 C) (Oral)   Resp 18   Ht '5\' 5"'  (1.651 m)   Wt 111 kg (244 lb 11.4 oz)   SpO2 95%   BMI 40.72 kg/m  Pain Assessment: 0-10   Pain Score: 0-No pain   SpO2: SpO2: 95 % O2 Device:SpO2: 95 % O2 Flow Rate: .O2 Flow Rate (L/min): 2 L/min  IO: Intake/output summary:  Intake/Output Summary (Last 24 hours) at 02/24/16 1101 Last data filed at 02/24/16 0911  Gross per 24 hour  Intake              240 ml  Output              750 ml  Net             -510 ml    LBM: Last BM Date: 02/20/16 Baseline Weight: Weight: 111 kg (244 lb 11.4 oz) Most recent weight: Weight: 111 kg (244 lb 11.4 oz)     Palliative Assessment/Data:     Time In: 10:00  Time Out: 11:10 Time Total: 70 Greater than 50%  of this time was spent counseling and coordinating care related to the above assessment and plan.  Signed by: Imogene Burn, PA-C Palliative Medicine Pager: [phone]  Please contact Palliative Medicine Team phone at [phone] for questions and concerns.  For individual provider:  See Amion

## 2016-02-24 NOTE — Progress Notes (Signed)
PT Cancellation Note  Patient Details Name: Terry Robertson. MRN: [id number] DOB: 1928-06-25   Cancelled Treatment:    Reason Eval/Treat Not Completed: Fatigue/lethargy limiting ability to participate (Pt receiving blood transfusion, new diagnosis of Cancer with hospice consult pending.  DNR status now.  Will defer treatment this pm.  )   Len Azeez Eli Hose 02/24/2016, 5:26 PM Governor Rooks, PTA pager [phone]

## 2016-02-24 NOTE — Progress Notes (Addendum)
[facility] Liaison  Notified by Levada Dy [facility] of family request for Hospice and [place] services at home after discharge.  Chart and patient information currently under review to confirm hospice eligibility.  Spoke with patient, who was attended by his girlfriend, to initiate education related to hospice philosophy, services and team approach to care.  Family verbalized understanding of the information provided.  Per discussion plan is for discharge to home by personal vehicle with family tomorrow.  Please send signed completed DNR form home with patient. Patient will need prescriptions for discharge comfort medications.  DME needs will be discussed with daughter tomorrow.  Girlfriend did not want DME ordered today. The home address has been verified and is correct in the chart; will determine which family member to contact for delivery of equipment as plans progress.  [facility] aware of the above.    Completed discharge summary will need to be faxed to [facility] at [phone] when final.  Please notify [facility] when patient is ready to leave unit at discharge --call [phone].  Above information shared with Levada Dy, [facility].  ** Spoke with daughter, Tivis Ringer, she advised will be in town tomorrow around noon and will meet [facility] in patient's room at 2:00pm.  She wants to discuss DME needs tomorrow - no DME ordered today.**     Thank you,   Edyth Gunnels, RN, BSN [employer] are now on [place]. Please feel free to contact us directly. I can be reached today at [phone] or you can call our main number at [phone].

## 2016-02-24 NOTE — Patient Outreach (Addendum)
[place] [facility]) Care Management  [facility] Social Work  02/24/2016  Terry Bolejack Jr. [birth year] [id number]  Encounter Medications:  Facility-Administered Encounter Medications as of 02/24/2016  Medication  . 0.9 %  sodium chloride infusion  . 0.9 %  sodium chloride infusion  . acetaminophen (TYLENOL) tablet 650 mg  . amoxicillin-clavulanate (AUGMENTIN) 875-125 MG per tablet 1 tablet  . aspirin EC tablet 81 mg  . atorvastatin (LIPITOR) tablet 20 mg  . cholecalciferol (VITAMIN D) tablet 2,000 Units  . diltiazem (CARDIZEM CD) 24 hr capsule 180 mg  . diphenhydrAMINE (BENADRYL) capsule 25 mg  . feeding supplement (ENSURE ENLIVE) (ENSURE ENLIVE) liquid 237 mL  . ferrous sulfate tablet 325 mg  . furosemide (LASIX) tablet 20 mg  . gabapentin (NEURONTIN) capsule 100 mg  . HYDROmorphone (DILAUDID) injection 0.5 mg  . HYDROmorphone (DILAUDID) injection 1 mg  . insulin aspart (novoLOG) injection 0-15 Units  . latanoprost (XALATAN) 0.005 % ophthalmic solution 1 drop  . levETIRAcetam (KEPPRA) tablet 500 mg  . metoprolol tartrate (LOPRESSOR) tablet 25 mg  . omega-3 acid ethyl esters (LOVAZA) capsule 2 g  . polyethylene glycol (MIRALAX / GLYCOLAX) packet 17 g  . sodium chloride flush (NS) 0.9 % injection 3 mL  . sodium chloride flush (NS) 0.9 % injection 3 mL  . vitamin C (ASCORBIC ACID) tablet 1,000 mg  . Warfarin - Pharmacist Dosing Inpatient   Outpatient Encounter Prescriptions as of 02/24/2016  Medication Sig  . Ascorbic Acid (VITAMIN C) 1000 MG tablet Take 1,000 mg by mouth daily.  Marland Kitchen aspirin EC 81 MG tablet Take 81 mg by mouth daily.  Marland Kitchen atorvastatin (LIPITOR) 20 MG tablet Take 20 mg by mouth daily.  Marland Kitchen azithromycin (ZITHROMAX) 500 MG tablet Take 1 tablet (500 mg total) by mouth daily. For 2 more days  . CARTIA XT 180 MG 24 hr capsule Take 180 mg by mouth daily.   . cefdinir (OMNICEF) 300 MG capsule Take 1 capsule (300 mg total) by mouth 2 (two) times daily. For 3 more days   . Cholecalciferol (VITAMIN D) 2000 UNITS CAPS Take 2,000 Units by mouth every morning.   Marland Kitchen doxycycline (VIBRA-TABS) 100 MG tablet Take 1 tablet (100 mg total) by mouth every 12 (twelve) hours.  . feeding supplement, ENSURE ENLIVE, (ENSURE ENLIVE) LIQD Take 237 mLs by mouth 2 (two) times daily between meals.  . ferrous sulfate 325 (65 FE) MG tablet Take 325 mg by mouth daily with breakfast.  . furosemide (LASIX) 20 MG tablet Take 1 tablet (20 mg total) by mouth every morning. RESUME ON 02/22/16  . gabapentin (NEURONTIN) 100 MG capsule Take 100 mg by mouth at bedtime.   Marland Kitchen HYDROcodone-acetaminophen (NORCO/VICODIN) 5-325 MG tablet Take 1-2 tablets by mouth every 6 (six) hours as needed for moderate pain.  Marland Kitchen latanoprost (XALATAN) 0.005 % ophthalmic solution Place 1 drop into both eyes at bedtime.   . levETIRAcetam (KEPPRA) 500 MG tablet Take 1 tablet (500 mg total) by mouth every 12 (twelve) hours.  . metoprolol tartrate (LOPRESSOR) 25 MG tablet Take 1 tablet (25 mg total) by mouth every evening.  . polyethylene glycol (MIRALAX / GLYCOLAX) packet Take 17 g by mouth daily.  . predniSONE (DELTASONE) 10 MG tablet Take 5 mg by mouth daily with breakfast.   . VASCEPA 1 G CAPS Take 2 g by mouth 2 (two) times daily.   Marland Kitchen warfarin (COUMADIN) 5 MG tablet Take 1-1.5 tablets (5-7.5 mg total) by mouth See admin instructions. Takes 5mg  on  sun only, takes 7.5mg  all other days.  PLEASE RESUME ON 02/22/16.    Functional Status:  In your present state of health, do you have any difficulty performing the following activities: 02/22/2016 02/15/2016  Hearing? Terry Robertson  Vision? N N  Difficulty concentrating or making decisions? N Y  Walking or climbing stairs? Y Y  Dressing or bathing? N Y  Doing errands, shopping? - Y  Some recent data might be hidden    Fall/Depression Screening:  No flowsheet data found.  Assessment: CSW received new SNF referral on patient on 02/23/16. CSW arrived at [facility] SNF  but was informed by staff that patient was suppose to arrive yesterday but then it was decided that he would need to stay in the hospital longer. They are unsure as to when patient will be discharging to their facility. Palliative care consult took place today. CSW went to SNF social worker's office but she was not there. CSW will follow case and will complete SNF visit within 7 days after discharge.  Plan-CSW will continue to follow patient's status. CSW will send involvement letter to PCP.  Eula Fried, BSW, MSW, [email or web address] Phone: [phone] Fax: [phone]    Plan:

## 2016-02-25 ENCOUNTER — Other Ambulatory Visit: Payer: Self-pay | Admitting: Licensed Clinical Social Worker

## 2016-02-25 DIAGNOSIS — C9200 Acute myeloblastic leukemia, not having achieved remission: Secondary | ICD-10-CM

## 2016-02-25 LAB — TYPE AND SCREEN
Blood Product Expiration Date: 201801032359
ISSUE DATE / TIME: 201712211455
UNIT TYPE AND RH: 9500

## 2016-02-25 LAB — GLUCOSE, CAPILLARY
Glucose-Capillary: 78 mg/dL (ref 65–99)
Glucose-Capillary: 87 mg/dL (ref 65–99)

## 2016-02-25 MED ORDER — LORAZEPAM 2 MG/ML PO CONC: 0.5000 mg | ORAL | 0 refills | Status: AC | PRN

## 2016-02-25 MED ORDER — MORPHINE SULFATE (CONCENTRATE) 10 MG/0.5ML PO SOLN: 5.0000 mg | ORAL | 0 refills | Status: AC | PRN

## 2016-02-25 NOTE — Progress Notes (Signed)
[place] [facility] Liaison  Visited with patient who was attended by [facility], girlfriend, Enid Derry, daughter and several children/grandchildren.  Patient alert and in good spirits when I visited.   Patient denies any problems at this time and was having no pain.  Went over Hospice benefits and services with Enid Derry, daughter. Spoke with Levada Dy, [facility], she is going to follow up on DNR form to see if we can get a signed copy for patient to take home.   Levada Dy, [facility] will be calling for transport to come get patient.   Spoke with patient RN who is aware that we have completed hospital information to patient and he is ready to go.    DME should have been delivered to the home by [facility] earlier today.     Thank you,  Edyth Gunnels, RN, BSN [place] are now on AMION. If you have any questions, call [phone] or you can call directly [phone].

## 2016-02-25 NOTE — Care Management Important Message (Signed)
Important Message  Patient Details  Name: Terry Robertson. MRN: [id number] Date of Birth: 10-Feb-1929   Medicare Important Message Given:  Yes    Mel Tadros Abena 02/25/2016, 11:01 AM

## 2016-02-25 NOTE — Consult Note (Signed)
   [facility] CM Inpatient Consult   02/25/2016  Sheridan Ritchison. March 27, 1928 [id number]    Confirmed with inpatient RNCM that discharge plan is home with hospice services. Will alert [facility] team.   Marthenia Rolling, MSN-Ed, RN,BSN [facility] Liaison [phone]

## 2016-02-25 NOTE — Progress Notes (Signed)
02/25/2016 10:22 AM  Progress Note  Pt was seen and examined.  He is comfortable in no distress.  He is wanting to go home today with hospice services.   I updated the discharge summary.  I printed and signed his prescriptions.  Pt is stable to be discharged home today with hospice care services.  Time spent: 33 mins  Terry Natal, MD [facility]

## 2016-02-25 NOTE — Patient Outreach (Addendum)
[place] [facility]) Care Management  02/25/2016  Osten Jacox. 1928/08/07 [id number]   Assessment- CSW completed chart review and plans for discharge have changed from SNF to returning home with hospice services. Hospice consult is pending and they are waiting to see if he is eligible for services. CSW will continue to follow case. [facility] care members will complete case closure if patient becomes active with hospice as we will no longer be needed. CSW received update from [place].   Plan-CSW will continue to follow case and plan for discharge.  Eula Fried, BSW, MSW, [email or web address] Phone: [phone] Fax: [phone]

## 2016-02-25 NOTE — Care Management Note (Addendum)
Case Management Note  Patient Details  Name: Paige Lacour. MRN: [id number] Date of Birth: 1929-01-30  Subjective/Objective:                    Action/Plan: Plan is to d/c to home today with home hospice ([facility]). [facility] called per CM and transportation to home arranged. Nurse and family made aware by CM.  Expected Discharge Date:    02/25/2016           Expected Discharge Plan:  [place] Referral:  Clinical Social Work  Discharge planning Services  CM Consult  Post Acute Care Choice:    Choice offered to:  Patient, Adult Children [name] (daughter) ,[phone])  DME Arranged:    DME Agency:     HH Arranged:   Home hospice [facility] Agency:  [facility] and [place] [facility] [place], [phone]  Status of Service:  Completed, signed off  If discussed at [place] of Stay Meetings, dates discussed:    Additional Comments:  Sharin Mons, RN 02/25/2016, 3:06 PM

## 2016-02-25 NOTE — Progress Notes (Addendum)
[facility] Liaison  Ordered hospital bed, over the bed side table and bedside commode per family request.  Family aware that they will have to have someone home to receive the equipment.    Thank you,  Edyth Gunnels, RN, BSN [phone] [place] Matte are now on AMION. If you have any questions, please call the number above or you can call liaison directly at [phone].  **UPDATE:  02/25/2016 1333pm.** Spoke with [facility] and they have been unable to get in touch with the patient's family.  I contacted Levada Dy [facility] and she spoke with the family and was able to get the name of the person waiting on equipment at the house.  [facility] given Pete's number at [phone]".  [facility] still trying to deliver equipment as to not delay discharge.

## 2016-02-26 LAB — CULTURE, BLOOD (ROUTINE X 2)
CULTURE: NO GROWTH
CULTURE: NO GROWTH

## 2016-02-29 ENCOUNTER — Encounter: Payer: Self-pay | Admitting: Licensed Clinical Social Worker

## 2016-02-29 ENCOUNTER — Other Ambulatory Visit: Payer: Self-pay | Admitting: Licensed Clinical Social Worker

## 2016-02-29 NOTE — Patient Outreach (Signed)
[place] [facility]) Care Management    Graden Sandmeyer. 01-08-1929 [id number]   Assessment- CSW completed call to Hospice and [place] to confirm that patient discharged from hospital with hospice services. Front desk paged twice but CSW was unable to speak to anyone. CSW was advised that some staff are out of the office today.  CSW completed outreach attempt later in the day and was able to speak to someone and confirm that patient is active with Hospice services.  Plan-CSW will complete case closure at this time and will send update to [facility] care team members and PCP.   Eula Fried, BSW, MSW, [email or web address] Phone: [phone] Fax: [phone]

## 2016-03-02 ENCOUNTER — Other Ambulatory Visit: Payer: Self-pay

## 2016-03-02 NOTE — Patient Outreach (Addendum)
[place] [facility]) Care Management  03/02/2016  Terry Robertson. [birth year] [id number]   Difficult Case Discussion    Issue:  30 Day Re-admission  Admissions: 2 02/21/16 - 02/25/16 [facility] -Discharged Home with Hospice 02/25/16 02/15/16 - 02/19/16 [facility]  ED visits:  1   H/o [facility] Referral received 02/18/16 during IP admission.  H/o patient discharged on Saturday 02/19/16 to home with resumption of [facility] services.  Admission PT evaluation recommended discharge to SNF.  H/o patient discharge home with no 24/7 caregiver option.  Patient and daughter refused SNF admission.  Patient was active with [facility] services prior to admission and services resumed at discharge. RN, PT, OT [facility]).  RN CM verified with [facility] contact [name] that RN visits were made on 12/6, 12/8, 12/12 and (12/17 post discharge). Last PT visit 02/15/16 and NO CNA order.  Patient admitted back to hospital 2 days post discharge on 02/21/16  H/o 82 yo male presented back to ED on 02/21/16 with a Fall; found on the floor. Confused, dehydrated, elevated CK levels; worsening leukocytosis (discharged  2 days prior on prednisone and x-ray (HCAP); worsening anemia, heme + stool during previous adm. (GI evaluated during 1st admission and OP follow-up plan established). C/O severe back pain, unable to walk. Unable to get out of chair night prior due to back pain.  Family cannot be with patient 24/7.  PMH:  HCAP, Essential HTN, DM type 2, Paroxysmal atrial fibrillation, Diastolic dysfunction:  Grade 2, CAD, Left ventricular diastolic dysfunction, NYHA class 2 ), Dyslipidemia, Myelodysplastic syndrome, Cerebral infarction, long term current use of anticoagulant therapy, MGUS (monoclonal gammopathy of unknown significance), multiple myeloma in remission, Thrombocytopenia, Leukocytosis, seizures, spinal stenosis with neurogenic claudication, severe obesity, Fall at home.   Case closed to [facility] services due to patient active with Hospice services.     Nathaneil Canary, BSN, RN, [place] Care Management Care Management Coordinator [phone] Direct [phone] Cell [phone] Office [phone] Fax [email or web address]

## 2016-03-06 DEATH — deceased

## 2016-03-07 ENCOUNTER — Telehealth: Payer: Self-pay | Admitting: *Deleted

## 2016-03-07 NOTE — Telephone Encounter (Signed)
Spoke with son, [name]. States patient died on 2022/06/28.

## 2016-03-07 NOTE — Telephone Encounter (Signed)
-----   Message from Heath Lark, MD sent at 03/07/2016  9:22 AM EST ----- Regarding: hospice care I tried to call his sons last week, not able to How is the patient doing at home?

## 2016-03-29 ENCOUNTER — Ambulatory Visit (INDEPENDENT_AMBULATORY_CARE_PROVIDER_SITE_OTHER): Payer: Commercial Managed Care - HMO | Admitting: Podiatry

## 2016-03-29 NOTE — Progress Notes (Signed)
ERRONEOUS ENCOUNTER NO SHOW  

## 2016-04-27 ENCOUNTER — Ambulatory Visit: Payer: Self-pay | Admitting: Pharmacist Clinician (PhC)/ Clinical Pharmacy Specialist

## 2016-04-27 DIAGNOSIS — Z7901 Long term (current) use of anticoagulants: Secondary | ICD-10-CM

## 2016-04-27 DIAGNOSIS — I639 Cerebral infarction, unspecified: Secondary | ICD-10-CM

## 2016-05-03 ENCOUNTER — Ambulatory Visit (INDEPENDENT_AMBULATORY_CARE_PROVIDER_SITE_OTHER): Payer: Commercial Managed Care - HMO | Admitting: Specialist

## 2017-02-09 ENCOUNTER — Other Ambulatory Visit: Payer: Self-pay | Admitting: Nurse Practitioner
